# Patient Record
Sex: Male | Born: 1935
Health system: Southern US, Community
[De-identification: ages and names within clinical notes are randomized; demographics above are authoritative.]

## PROBLEM LIST (undated history)

## (undated) DIAGNOSIS — J449 Chronic obstructive pulmonary disease, unspecified: Secondary | ICD-10-CM

## (undated) DIAGNOSIS — G5790 Unspecified mononeuropathy of unspecified lower limb: Secondary | ICD-10-CM

## (undated) DIAGNOSIS — K5790 Diverticulosis of intestine, part unspecified, without perforation or abscess without bleeding: Secondary | ICD-10-CM

## (undated) DIAGNOSIS — K567 Ileus, unspecified: Secondary | ICD-10-CM

## (undated) DIAGNOSIS — E119 Type 2 diabetes mellitus without complications: Secondary | ICD-10-CM

## (undated) DIAGNOSIS — Z860100 Personal history of colon polyps, unspecified: Secondary | ICD-10-CM

## (undated) DIAGNOSIS — M2550 Pain in unspecified joint: Secondary | ICD-10-CM

## (undated) DIAGNOSIS — E785 Hyperlipidemia, unspecified: Secondary | ICD-10-CM

## (undated) DIAGNOSIS — N289 Disorder of kidney and ureter, unspecified: Secondary | ICD-10-CM

## (undated) DIAGNOSIS — H269 Unspecified cataract: Secondary | ICD-10-CM

## (undated) DIAGNOSIS — E538 Deficiency of other specified B group vitamins: Secondary | ICD-10-CM

## (undated) DIAGNOSIS — D649 Anemia, unspecified: Secondary | ICD-10-CM

## (undated) DIAGNOSIS — E559 Vitamin D deficiency, unspecified: Secondary | ICD-10-CM

## (undated) DIAGNOSIS — K219 Gastro-esophageal reflux disease without esophagitis: Secondary | ICD-10-CM

## (undated) DIAGNOSIS — K635 Polyp of colon: Secondary | ICD-10-CM

## (undated) DIAGNOSIS — M1990 Unspecified osteoarthritis, unspecified site: Secondary | ICD-10-CM

## (undated) DIAGNOSIS — I10 Essential (primary) hypertension: Secondary | ICD-10-CM

## (undated) DIAGNOSIS — K9189 Other postprocedural complications and disorders of digestive system: Secondary | ICD-10-CM

## (undated) DIAGNOSIS — G629 Polyneuropathy, unspecified: Secondary | ICD-10-CM

## (undated) HISTORY — DX: Vitamin D deficiency, unspecified: E55.9

## (undated) HISTORY — DX: Anemia, unspecified: D64.9

## (undated) HISTORY — PX: POLYPECTOMY: SHX149

## (undated) HISTORY — DX: Unspecified cataract: H26.9

## (undated) HISTORY — DX: Deficiency of other specified B group vitamins: E53.8

## (undated) HISTORY — PX: TURP VAPORIZATION: SUR1397

## (undated) HISTORY — PX: CATARACT EXTRACTION: SUR2

## (undated) HISTORY — DX: Unspecified osteoarthritis, unspecified site: M19.90

## (undated) HISTORY — DX: Gastro-esophageal reflux disease without esophagitis: K21.9

## (undated) HISTORY — DX: Hyperlipidemia, unspecified: E78.5

## (undated) HISTORY — DX: Unspecified mononeuropathy of unspecified lower limb: G57.90

## (undated) HISTORY — DX: Type 2 diabetes mellitus without complications: E11.9

## (undated) HISTORY — DX: Polyp of colon: K63.5

## (undated) HISTORY — PX: COLONOSCOPY: SHX174

## (undated) HISTORY — DX: Essential (primary) hypertension: I10

## (undated) HISTORY — DX: Diverticulosis of intestine, part unspecified, without perforation or abscess without bleeding: K57.90

---

## 1997-08-24 ENCOUNTER — Ambulatory Visit (HOSPITAL_COMMUNITY): Admission: RE | Admit: 1997-08-24 | Discharge: 1997-08-24 | Payer: Self-pay | Admitting: Urology

## 1998-04-30 ENCOUNTER — Inpatient Hospital Stay (HOSPITAL_COMMUNITY): Admission: EM | Admit: 1998-04-30 | Discharge: 1998-05-02 | Payer: Self-pay | Admitting: Emergency Medicine

## 1998-04-30 ENCOUNTER — Encounter: Payer: Self-pay | Admitting: Emergency Medicine

## 1999-03-10 HISTORY — PX: TOTAL KNEE ARTHROPLASTY: SHX125

## 1999-04-01 ENCOUNTER — Ambulatory Visit (HOSPITAL_BASED_OUTPATIENT_CLINIC_OR_DEPARTMENT_OTHER): Admission: RE | Admit: 1999-04-01 | Discharge: 1999-04-01 | Payer: Self-pay | Admitting: Urology

## 1999-06-09 LAB — HM COLONOSCOPY

## 1999-07-23 ENCOUNTER — Encounter: Payer: Self-pay | Admitting: Internal Medicine

## 1999-07-23 ENCOUNTER — Ambulatory Visit (HOSPITAL_COMMUNITY): Admission: RE | Admit: 1999-07-23 | Discharge: 1999-07-23 | Payer: Self-pay | Admitting: Internal Medicine

## 1999-09-01 ENCOUNTER — Encounter: Payer: Self-pay | Admitting: Specialist

## 1999-09-08 ENCOUNTER — Inpatient Hospital Stay (HOSPITAL_COMMUNITY): Admission: RE | Admit: 1999-09-08 | Discharge: 1999-09-14 | Payer: Self-pay | Admitting: Specialist

## 1999-09-09 ENCOUNTER — Encounter: Payer: Self-pay | Admitting: Specialist

## 1999-09-11 ENCOUNTER — Encounter: Payer: Self-pay | Admitting: Specialist

## 1999-09-11 ENCOUNTER — Encounter: Payer: Self-pay | Admitting: Gastroenterology

## 1999-09-12 ENCOUNTER — Encounter: Payer: Self-pay | Admitting: Gastroenterology

## 2000-12-23 ENCOUNTER — Encounter (INDEPENDENT_AMBULATORY_CARE_PROVIDER_SITE_OTHER): Payer: Self-pay | Admitting: *Deleted

## 2000-12-23 ENCOUNTER — Ambulatory Visit (HOSPITAL_COMMUNITY): Admission: RE | Admit: 2000-12-23 | Discharge: 2000-12-23 | Payer: Self-pay | Admitting: Gastroenterology

## 2002-01-10 ENCOUNTER — Encounter: Admission: RE | Admit: 2002-01-10 | Discharge: 2002-01-10 | Payer: Self-pay | Admitting: Internal Medicine

## 2002-01-10 ENCOUNTER — Encounter: Payer: Self-pay | Admitting: Internal Medicine

## 2002-01-13 ENCOUNTER — Encounter: Payer: Self-pay | Admitting: Internal Medicine

## 2002-01-13 ENCOUNTER — Encounter: Admission: RE | Admit: 2002-01-13 | Discharge: 2002-01-13 | Payer: Self-pay | Admitting: Internal Medicine

## 2004-05-20 ENCOUNTER — Ambulatory Visit: Payer: Self-pay | Admitting: Internal Medicine

## 2004-05-21 ENCOUNTER — Ambulatory Visit: Payer: Self-pay | Admitting: Internal Medicine

## 2004-05-21 ENCOUNTER — Emergency Department (HOSPITAL_COMMUNITY): Admission: EM | Admit: 2004-05-21 | Discharge: 2004-05-21 | Payer: Self-pay | Admitting: Family Medicine

## 2004-05-23 ENCOUNTER — Ambulatory Visit: Payer: Self-pay | Admitting: Internal Medicine

## 2004-06-09 ENCOUNTER — Ambulatory Visit: Payer: Self-pay | Admitting: Internal Medicine

## 2004-07-16 ENCOUNTER — Ambulatory Visit: Payer: Self-pay | Admitting: Internal Medicine

## 2004-10-17 ENCOUNTER — Ambulatory Visit: Payer: Self-pay | Admitting: Internal Medicine

## 2005-05-19 ENCOUNTER — Ambulatory Visit: Payer: Self-pay | Admitting: Internal Medicine

## 2005-05-26 ENCOUNTER — Ambulatory Visit: Payer: Self-pay | Admitting: Internal Medicine

## 2005-07-22 ENCOUNTER — Ambulatory Visit: Payer: Self-pay | Admitting: Internal Medicine

## 2006-03-01 ENCOUNTER — Ambulatory Visit: Payer: Self-pay | Admitting: Internal Medicine

## 2006-03-19 ENCOUNTER — Ambulatory Visit: Payer: Self-pay | Admitting: Internal Medicine

## 2006-07-15 ENCOUNTER — Ambulatory Visit: Payer: Self-pay | Admitting: Internal Medicine

## 2006-07-15 LAB — CONVERTED CEMR LAB
ALT: 25 units/L (ref 0–40)
AST: 28 units/L (ref 0–37)
Albumin: 4 g/dL (ref 3.5–5.2)
Alkaline Phosphatase: 98 units/L (ref 39–117)
BUN: 17 mg/dL (ref 6–23)
Basophils Absolute: 0 10*3/uL (ref 0.0–0.1)
Basophils Relative: 0.9 % (ref 0.0–1.0)
Bilirubin, Direct: 0.2 mg/dL (ref 0.0–0.3)
CO2: 31 meq/L (ref 19–32)
Calcium: 9.4 mg/dL (ref 8.4–10.5)
Chloride: 105 meq/L (ref 96–112)
Cholesterol: 129 mg/dL (ref 0–200)
Creatinine, Ser: 0.8 mg/dL (ref 0.4–1.5)
Eosinophils Absolute: 0.2 10*3/uL (ref 0.0–0.6)
Eosinophils Relative: 4.2 % (ref 0.0–5.0)
GFR calc Af Amer: 123 mL/min
GFR calc non Af Amer: 102 mL/min
Glucose, Bld: 131 mg/dL — ABNORMAL HIGH (ref 70–99)
HCT: 45.4 % (ref 39.0–52.0)
HDL: 46.2 mg/dL (ref >39.0)
Hemoglobin: 15.2 g/dL (ref 13.0–17.0)
LDL Cholesterol: 70 mg/dL (ref 0–99)
Lymphocytes Relative: 35 % (ref 12.0–46.0)
MCHC: 33.5 g/dL (ref 30.0–36.0)
MCV: 85.9 fL (ref 78.0–100.0)
Monocytes Absolute: 0.5 10*3/uL (ref 0.2–0.7)
Monocytes Relative: 12.7 % — ABNORMAL HIGH (ref 3.0–11.0)
Neutro Abs: 1.6 10*3/uL (ref 1.4–7.7)
Neutrophils Relative %: 47.2 % (ref 43.0–77.0)
PSA: 0.83 ng/mL (ref 0.10–4.00)
Platelets: 195 10*3/uL (ref 150–400)
Potassium: 4.2 meq/L (ref 3.5–5.1)
RBC: 5.28 M/uL (ref 4.22–5.81)
RDW: 13.3 % (ref 11.5–14.6)
Sodium: 143 meq/L (ref 135–145)
TSH: 2.72 microintl units/mL (ref 0.35–5.50)
Total Bilirubin: 0.8 mg/dL (ref 0.3–1.2)
Total CHOL/HDL Ratio: 2.8
Total Protein: 7 g/dL (ref 6.0–8.3)
Triglycerides: 64 mg/dL (ref 0–149)
VLDL: 13 mg/dL (ref 0–40)
WBC: 3.6 10*3/uL — ABNORMAL LOW (ref 4.5–10.5)

## 2006-10-05 DIAGNOSIS — I10 Essential (primary) hypertension: Secondary | ICD-10-CM | POA: Insufficient documentation

## 2006-10-05 DIAGNOSIS — K219 Gastro-esophageal reflux disease without esophagitis: Secondary | ICD-10-CM | POA: Insufficient documentation

## 2007-01-24 ENCOUNTER — Ambulatory Visit: Payer: Self-pay | Admitting: Internal Medicine

## 2007-01-24 DIAGNOSIS — J309 Allergic rhinitis, unspecified: Secondary | ICD-10-CM | POA: Insufficient documentation

## 2007-01-24 DIAGNOSIS — R05 Cough: Secondary | ICD-10-CM

## 2007-01-24 DIAGNOSIS — E119 Type 2 diabetes mellitus without complications: Secondary | ICD-10-CM | POA: Insufficient documentation

## 2007-01-24 DIAGNOSIS — E785 Hyperlipidemia, unspecified: Secondary | ICD-10-CM | POA: Insufficient documentation

## 2007-01-24 DIAGNOSIS — E1140 Type 2 diabetes mellitus with diabetic neuropathy, unspecified: Secondary | ICD-10-CM | POA: Insufficient documentation

## 2007-01-24 DIAGNOSIS — N400 Enlarged prostate without lower urinary tract symptoms: Secondary | ICD-10-CM | POA: Insufficient documentation

## 2007-01-24 DIAGNOSIS — R059 Cough, unspecified: Secondary | ICD-10-CM | POA: Insufficient documentation

## 2007-01-26 LAB — CONVERTED CEMR LAB
ALT: 28 units/L (ref 0–53)
AST: 34 units/L (ref 0–37)
Albumin: 4.2 g/dL (ref 3.5–5.2)
Alkaline Phosphatase: 101 units/L (ref 39–117)
BUN: 11 mg/dL (ref 6–23)
Bilirubin, Direct: 0.1 mg/dL (ref 0.0–0.3)
CO2: 31 meq/L (ref 19–32)
Calcium: 10 mg/dL (ref 8.4–10.5)
Chloride: 102 meq/L (ref 96–112)
Cholesterol: 136 mg/dL (ref 0–200)
Creatinine, Ser: 0.8 mg/dL (ref 0.4–1.5)
GFR calc Af Amer: 123 mL/min
GFR calc non Af Amer: 102 mL/min
Glucose, Bld: 135 mg/dL — ABNORMAL HIGH (ref 70–99)
HDL: 48.9 mg/dL (ref >39.0)
Hgb A1c MFr Bld: 7.6 % — ABNORMAL HIGH (ref 4.6–6.0)
LDL Cholesterol: 75 mg/dL (ref 0–99)
Potassium: 4.3 meq/L (ref 3.5–5.1)
Sodium: 141 meq/L (ref 135–145)
Total Bilirubin: 0.7 mg/dL (ref 0.3–1.2)
Total CHOL/HDL Ratio: 2.8
Total Protein: 7.5 g/dL (ref 6.0–8.3)
Triglycerides: 62 mg/dL (ref 0–149)
VLDL: 12 mg/dL (ref 0–40)

## 2007-04-27 ENCOUNTER — Ambulatory Visit: Payer: Self-pay | Admitting: Internal Medicine

## 2007-04-28 LAB — CONVERTED CEMR LAB
BUN: 10 mg/dL (ref 6–23)
CO2: 32 meq/L (ref 19–32)
Calcium: 9.5 mg/dL (ref 8.4–10.5)
Chloride: 105 meq/L (ref 96–112)
Cholesterol: 135 mg/dL (ref 0–200)
Creatinine, Ser: 0.9 mg/dL (ref 0.4–1.5)
GFR calc Af Amer: 107 mL/min
GFR calc non Af Amer: 89 mL/min
Glucose, Bld: 132 mg/dL — ABNORMAL HIGH (ref 70–99)
HDL: 45.3 mg/dL (ref >39.0)
Hgb A1c MFr Bld: 7.5 % — ABNORMAL HIGH (ref 4.6–6.0)
LDL Cholesterol: 74 mg/dL (ref 0–99)
Potassium: 4.5 meq/L (ref 3.5–5.1)
Sodium: 142 meq/L (ref 135–145)
Total CHOL/HDL Ratio: 3
Triglycerides: 77 mg/dL (ref 0–149)
VLDL: 15 mg/dL (ref 0–40)

## 2007-05-23 ENCOUNTER — Telehealth: Payer: Self-pay | Admitting: Internal Medicine

## 2007-07-27 ENCOUNTER — Ambulatory Visit: Payer: Self-pay | Admitting: Internal Medicine

## 2007-07-27 DIAGNOSIS — R5383 Other fatigue: Secondary | ICD-10-CM

## 2007-07-27 DIAGNOSIS — R5381 Other malaise: Secondary | ICD-10-CM | POA: Insufficient documentation

## 2007-07-27 LAB — CONVERTED CEMR LAB
ALT: 16 units/L (ref 0–53)
AST: 23 units/L (ref 0–37)
Albumin: 4.2 g/dL (ref 3.5–5.2)
Alkaline Phosphatase: 102 units/L (ref 39–117)
BUN: 12 mg/dL (ref 6–23)
Basophils Absolute: 0 10*3/uL (ref 0.0–0.1)
Basophils Relative: 1 % (ref 0.0–1.0)
Bilirubin, Direct: 0.1 mg/dL (ref 0.0–0.3)
CO2: 32 meq/L (ref 19–32)
Calcium: 9.6 mg/dL (ref 8.4–10.5)
Chloride: 108 meq/L (ref 96–112)
Cholesterol: 133 mg/dL (ref 0–200)
Creatinine, Ser: 0.8 mg/dL (ref 0.4–1.5)
Creatinine,U: 153.9 mg/dL
Eosinophils Absolute: 0.1 10*3/uL (ref 0.0–0.7)
Eosinophils Relative: 2.7 % (ref 0.0–5.0)
GFR calc Af Amer: 123 mL/min
GFR calc non Af Amer: 101 mL/min
Glucose, Bld: 135 mg/dL — ABNORMAL HIGH (ref 70–99)
HCT: 44.7 % (ref 39.0–52.0)
HDL: 51 mg/dL (ref >39.0)
Hemoglobin: 14.6 g/dL (ref 13.0–17.0)
Hgb A1c MFr Bld: 7.2 % — ABNORMAL HIGH (ref 4.6–6.0)
LDL Cholesterol: 66 mg/dL (ref 0–99)
Lymphocytes Relative: 31.7 % (ref 12.0–46.0)
MCHC: 32.7 g/dL (ref 30.0–36.0)
MCV: 86.8 fL (ref 78.0–100.0)
Microalb Creat Ratio: 16.9 mg/g (ref 0.0–30.0)
Microalb, Ur: 2.6 mg/dL — ABNORMAL HIGH (ref 0.0–1.9)
Monocytes Absolute: 0.3 10*3/uL (ref 0.1–1.0)
Monocytes Relative: 8.8 % (ref 3.0–12.0)
Neutro Abs: 2.1 10*3/uL (ref 1.4–7.7)
Neutrophils Relative %: 55.8 % (ref 43.0–77.0)
Platelets: 178 10*3/uL (ref 150–400)
Potassium: 4.6 meq/L (ref 3.5–5.1)
RBC: 5.15 M/uL (ref 4.22–5.81)
RDW: 13.5 % (ref 11.5–14.6)
Sodium: 146 meq/L — ABNORMAL HIGH (ref 135–145)
TSH: 2.41 microintl units/mL (ref 0.35–5.50)
Total Bilirubin: 0.6 mg/dL (ref 0.3–1.2)
Total CHOL/HDL Ratio: 2.6
Total Protein: 7.2 g/dL (ref 6.0–8.3)
Triglycerides: 81 mg/dL (ref 0–149)
VLDL: 16 mg/dL (ref 0–40)
WBC: 3.7 10*3/uL — ABNORMAL LOW (ref 4.5–10.5)

## 2007-08-12 ENCOUNTER — Encounter: Payer: Self-pay | Admitting: Internal Medicine

## 2007-08-25 ENCOUNTER — Telehealth: Payer: Self-pay | Admitting: Internal Medicine

## 2007-08-25 ENCOUNTER — Ambulatory Visit: Payer: Self-pay | Admitting: Internal Medicine

## 2007-10-04 ENCOUNTER — Telehealth (INDEPENDENT_AMBULATORY_CARE_PROVIDER_SITE_OTHER): Payer: Self-pay | Admitting: *Deleted

## 2007-10-06 ENCOUNTER — Ambulatory Visit: Payer: Self-pay | Admitting: Internal Medicine

## 2007-10-06 DIAGNOSIS — M79609 Pain in unspecified limb: Secondary | ICD-10-CM | POA: Insufficient documentation

## 2007-10-06 DIAGNOSIS — G5790 Unspecified mononeuropathy of unspecified lower limb: Secondary | ICD-10-CM | POA: Insufficient documentation

## 2007-10-06 HISTORY — DX: Unspecified mononeuropathy of unspecified lower limb: G57.90

## 2007-10-11 ENCOUNTER — Encounter: Payer: Self-pay | Admitting: Internal Medicine

## 2007-10-11 ENCOUNTER — Ambulatory Visit: Payer: Self-pay

## 2007-12-23 ENCOUNTER — Encounter: Payer: Self-pay | Admitting: Internal Medicine

## 2007-12-28 LAB — CONVERTED CEMR LAB: PSA: 1.19 ng/mL

## 2008-01-25 ENCOUNTER — Ambulatory Visit: Payer: Self-pay | Admitting: Internal Medicine

## 2008-01-25 LAB — CONVERTED CEMR LAB
BUN: 18 mg/dL (ref 6–23)
CO2: 32 meq/L (ref 19–32)
Calcium: 10 mg/dL (ref 8.4–10.5)
Chloride: 104 meq/L (ref 96–112)
Cholesterol: 127 mg/dL (ref 0–200)
Creatinine, Ser: 0.9 mg/dL (ref 0.4–1.5)
GFR calc Af Amer: 107 mL/min
GFR calc non Af Amer: 88 mL/min
Glucose, Bld: 112 mg/dL — ABNORMAL HIGH (ref 70–99)
HDL: 59.3 mg/dL (ref >39.0)
Hgb A1c MFr Bld: 6.8 % — ABNORMAL HIGH (ref 4.6–6.0)
LDL Cholesterol: 58 mg/dL (ref 0–99)
Potassium: 4.7 meq/L (ref 3.5–5.1)
Sodium: 141 meq/L (ref 135–145)
Total CHOL/HDL Ratio: 2.1
Triglycerides: 49 mg/dL (ref 0–149)
VLDL: 10 mg/dL (ref 0–40)

## 2008-06-11 ENCOUNTER — Ambulatory Visit: Payer: Self-pay | Admitting: Internal Medicine

## 2008-06-11 DIAGNOSIS — J209 Acute bronchitis, unspecified: Secondary | ICD-10-CM | POA: Insufficient documentation

## 2008-06-21 ENCOUNTER — Ambulatory Visit: Payer: Self-pay | Admitting: Internal Medicine

## 2008-06-21 DIAGNOSIS — G4700 Insomnia, unspecified: Secondary | ICD-10-CM | POA: Insufficient documentation

## 2008-07-25 ENCOUNTER — Ambulatory Visit: Payer: Self-pay | Admitting: Internal Medicine

## 2008-07-26 LAB — CONVERTED CEMR LAB
ALT: 16 units/L (ref 0–53)
AST: 21 units/L (ref 0–37)
Albumin: 4.3 g/dL (ref 3.5–5.2)
Alkaline Phosphatase: 94 units/L (ref 39–117)
BUN: 22 mg/dL (ref 6–23)
Basophils Absolute: 0 10*3/uL (ref 0.0–0.1)
Basophils Relative: 0.6 % (ref 0.0–3.0)
Bilirubin Urine: NEGATIVE
Bilirubin, Direct: 0.1 mg/dL (ref 0.0–0.3)
CO2: 30 meq/L (ref 19–32)
Calcium: 9.9 mg/dL (ref 8.4–10.5)
Chloride: 108 meq/L (ref 96–112)
Cholesterol: 130 mg/dL (ref 0–200)
Creatinine, Ser: 1 mg/dL (ref 0.4–1.5)
Creatinine,U: 119.7 mg/dL
Eosinophils Absolute: 0.1 10*3/uL (ref 0.0–0.7)
Eosinophils Relative: 3.2 % (ref 0.0–5.0)
Folate: 11.5 ng/mL
GFR calc non Af Amer: 94.46 mL/min (ref >60)
Glucose, Bld: 124 mg/dL — ABNORMAL HIGH (ref 70–99)
HCT: 42.1 % (ref 39.0–52.0)
HDL: 52.3 mg/dL (ref >39.00)
Hemoglobin, Urine: NEGATIVE
Hemoglobin: 14.1 g/dL (ref 13.0–17.0)
Hgb A1c MFr Bld: 7.1 % — ABNORMAL HIGH (ref 4.6–6.5)
Ketones, ur: NEGATIVE mg/dL
LDL Cholesterol: 68 mg/dL (ref 0–99)
Leukocytes, UA: NEGATIVE
Lymphocytes Relative: 31.2 % (ref 12.0–46.0)
Lymphs Abs: 1.2 10*3/uL (ref 0.7–4.0)
MCHC: 33.5 g/dL (ref 30.0–36.0)
MCV: 85.3 fL (ref 78.0–100.0)
Microalb Creat Ratio: 5.8 mg/g (ref 0.0–30.0)
Microalb, Ur: 0.7 mg/dL (ref 0.0–1.9)
Monocytes Absolute: 0.4 10*3/uL (ref 0.1–1.0)
Monocytes Relative: 9.8 % (ref 3.0–12.0)
Neutro Abs: 2.1 10*3/uL (ref 1.4–7.7)
Neutrophils Relative %: 55.2 % (ref 43.0–77.0)
Nitrite: NEGATIVE
PSA: 1.46 ng/mL (ref 0.10–4.00)
Platelets: 164 10*3/uL (ref 150.0–400.0)
Potassium: 4.7 meq/L (ref 3.5–5.1)
RBC: 4.94 M/uL (ref 4.22–5.81)
RDW: 13.7 % (ref 11.5–14.6)
Sed Rate: 14 mm/hr (ref 0–22)
Sodium: 143 meq/L (ref 135–145)
Specific Gravity, Urine: 1.02 (ref 1.000–1.030)
TSH: 1.12 microintl units/mL (ref 0.35–5.50)
Total Bilirubin: 0.4 mg/dL (ref 0.3–1.2)
Total CHOL/HDL Ratio: 2
Total Protein, Urine: NEGATIVE mg/dL
Total Protein: 7.3 g/dL (ref 6.0–8.3)
Triglycerides: 48 mg/dL (ref 0.0–149.0)
Urine Glucose: NEGATIVE mg/dL
Urobilinogen, UA: 0.2 (ref 0.0–1.0)
VLDL: 9.6 mg/dL (ref 0.0–40.0)
Vitamin B-12: 184 pg/mL — ABNORMAL LOW (ref 211–911)
WBC: 3.8 10*3/uL — ABNORMAL LOW (ref 4.5–10.5)
pH: 6 (ref 5.0–8.0)

## 2008-08-15 ENCOUNTER — Encounter: Payer: Self-pay | Admitting: Internal Medicine

## 2008-09-06 ENCOUNTER — Ambulatory Visit: Payer: Self-pay | Admitting: Internal Medicine

## 2008-09-06 DIAGNOSIS — E538 Deficiency of other specified B group vitamins: Secondary | ICD-10-CM

## 2008-09-06 HISTORY — DX: Deficiency of other specified B group vitamins: E53.8

## 2008-09-06 LAB — CONVERTED CEMR LAB
BUN: 14 mg/dL (ref 6–23)
CO2: 32 meq/L (ref 19–32)
Calcium: 9.9 mg/dL (ref 8.4–10.5)
Chloride: 100 meq/L (ref 96–112)
Creatinine, Ser: 0.9 mg/dL (ref 0.4–1.5)
GFR calc non Af Amer: 106.63 mL/min (ref >60)
Glucose, Bld: 185 mg/dL — ABNORMAL HIGH (ref 70–99)
Hgb A1c MFr Bld: 6.8 % — ABNORMAL HIGH (ref 4.6–6.5)
Potassium: 4.4 meq/L (ref 3.5–5.1)
Sodium: 140 meq/L (ref 135–145)

## 2008-12-31 ENCOUNTER — Encounter: Payer: Self-pay | Admitting: Internal Medicine

## 2009-03-22 ENCOUNTER — Ambulatory Visit: Payer: Self-pay | Admitting: Internal Medicine

## 2009-03-23 ENCOUNTER — Encounter: Payer: Self-pay | Admitting: Internal Medicine

## 2009-03-23 LAB — CONVERTED CEMR LAB
BUN: 13 mg/dL (ref 6–23)
CO2: 21 meq/L (ref 19–32)
Calcium: 9.9 mg/dL (ref 8.4–10.5)
Chloride: 103 meq/L (ref 96–112)
Cholesterol: 147 mg/dL (ref 0–200)
Creatinine, Ser: 0.97 mg/dL (ref 0.40–1.50)
Glucose, Bld: 92 mg/dL (ref 70–99)
HDL: 54 mg/dL (ref >39)
LDL Cholesterol: 72 mg/dL (ref 0–99)
Potassium: 4.5 meq/L (ref 3.5–5.3)
Sodium: 143 meq/L (ref 135–145)
Total CHOL/HDL Ratio: 2.7
Triglycerides: 106 mg/dL (ref <150)
VLDL: 21 mg/dL (ref 0–40)

## 2009-03-25 LAB — CONVERTED CEMR LAB: Hgb A1c MFr Bld: 7 % — ABNORMAL HIGH (ref 4.6–6.5)

## 2009-03-26 ENCOUNTER — Telehealth: Payer: Self-pay | Admitting: Internal Medicine

## 2009-05-23 ENCOUNTER — Ambulatory Visit: Payer: Self-pay | Admitting: Internal Medicine

## 2009-08-27 ENCOUNTER — Encounter: Payer: Self-pay | Admitting: Internal Medicine

## 2009-09-17 ENCOUNTER — Encounter (INDEPENDENT_AMBULATORY_CARE_PROVIDER_SITE_OTHER): Payer: Self-pay | Admitting: *Deleted

## 2009-09-17 ENCOUNTER — Ambulatory Visit: Payer: Self-pay | Admitting: Internal Medicine

## 2009-09-17 DIAGNOSIS — Z87891 Personal history of nicotine dependence: Secondary | ICD-10-CM | POA: Insufficient documentation

## 2009-09-17 LAB — CONVERTED CEMR LAB
ALT: 12 units/L (ref 0–53)
AST: 20 units/L (ref 0–37)
Albumin: 4.2 g/dL (ref 3.5–5.2)
Alkaline Phosphatase: 86 units/L (ref 39–117)
BUN: 16 mg/dL (ref 6–23)
Basophils Absolute: 0 10*3/uL (ref 0.0–0.1)
Basophils Relative: 0.8 % (ref 0.0–3.0)
Bilirubin Urine: NEGATIVE
Bilirubin, Direct: 0.1 mg/dL (ref 0.0–0.3)
CO2: 31 meq/L (ref 19–32)
Calcium: 9.6 mg/dL (ref 8.4–10.5)
Chloride: 105 meq/L (ref 96–112)
Cholesterol: 129 mg/dL (ref 0–200)
Creatinine, Ser: 0.9 mg/dL (ref 0.4–1.5)
Creatinine,U: 195.6 mg/dL
Eosinophils Absolute: 0.1 10*3/uL (ref 0.0–0.7)
Eosinophils Relative: 3.8 % (ref 0.0–5.0)
Folate: 12.5 ng/mL
GFR calc non Af Amer: 101.12 mL/min (ref >60)
Glucose, Bld: 120 mg/dL — ABNORMAL HIGH (ref 70–99)
HCT: 43 % (ref 39.0–52.0)
HDL: 50.6 mg/dL (ref >39.00)
Hemoglobin, Urine: NEGATIVE
Hemoglobin: 14.4 g/dL (ref 13.0–17.0)
Hgb A1c MFr Bld: 7.3 % — ABNORMAL HIGH (ref 4.6–6.5)
Iron: 58 ug/dL (ref 42–165)
Ketones, ur: NEGATIVE mg/dL
LDL Cholesterol: 66 mg/dL (ref 0–99)
Leukocytes, UA: NEGATIVE
Lymphocytes Relative: 32.2 % (ref 12.0–46.0)
Lymphs Abs: 1.2 10*3/uL (ref 0.7–4.0)
MCHC: 33.6 g/dL (ref 30.0–36.0)
MCV: 87.2 fL (ref 78.0–100.0)
Microalb Creat Ratio: 0.5 mg/g (ref 0.0–30.0)
Microalb, Ur: 0.9 mg/dL (ref 0.0–1.9)
Monocytes Absolute: 0.4 10*3/uL (ref 0.1–1.0)
Monocytes Relative: 10.2 % (ref 3.0–12.0)
Neutro Abs: 2 10*3/uL (ref 1.4–7.7)
Neutrophils Relative %: 53 % (ref 43.0–77.0)
Nitrite: NEGATIVE
Platelets: 184 10*3/uL (ref 150.0–400.0)
Potassium: 4.5 meq/L (ref 3.5–5.1)
RBC: 4.93 M/uL (ref 4.22–5.81)
RDW: 14.9 % — ABNORMAL HIGH (ref 11.5–14.6)
Saturation Ratios: 15.3 % — ABNORMAL LOW (ref 20.0–50.0)
Sed Rate: 13 mm/hr (ref 0–22)
Sodium: 143 meq/L (ref 135–145)
Specific Gravity, Urine: >=1.03 (ref 1.000–1.030)
TSH: 2.2 microintl units/mL (ref 0.35–5.50)
Total Bilirubin: 0.6 mg/dL (ref 0.3–1.2)
Total CHOL/HDL Ratio: 3
Total Protein, Urine: NEGATIVE mg/dL
Total Protein: 7 g/dL (ref 6.0–8.3)
Transferrin: 270.7 mg/dL (ref 212.0–360.0)
Triglycerides: 64 mg/dL (ref 0.0–149.0)
Urine Glucose: NEGATIVE mg/dL
Urobilinogen, UA: 0.2 (ref 0.0–1.0)
VLDL: 12.8 mg/dL (ref 0.0–40.0)
Vitamin B-12: 386 pg/mL (ref 211–911)
WBC: 3.7 10*3/uL — ABNORMAL LOW (ref 4.5–10.5)
pH: 6 (ref 5.0–8.0)

## 2009-09-18 ENCOUNTER — Telehealth (INDEPENDENT_AMBULATORY_CARE_PROVIDER_SITE_OTHER): Payer: Self-pay | Admitting: *Deleted

## 2009-09-18 LAB — CONVERTED CEMR LAB: Vit D, 25-Hydroxy: 24 ng/mL — ABNORMAL LOW (ref 30–89)

## 2009-09-19 ENCOUNTER — Encounter (INDEPENDENT_AMBULATORY_CARE_PROVIDER_SITE_OTHER): Payer: Self-pay | Admitting: *Deleted

## 2009-10-09 ENCOUNTER — Encounter: Payer: Self-pay | Admitting: Internal Medicine

## 2009-10-10 ENCOUNTER — Encounter: Payer: Self-pay | Admitting: Internal Medicine

## 2009-10-10 ENCOUNTER — Ambulatory Visit: Payer: Self-pay

## 2009-10-30 ENCOUNTER — Encounter (INDEPENDENT_AMBULATORY_CARE_PROVIDER_SITE_OTHER): Payer: Self-pay | Admitting: *Deleted

## 2009-11-04 ENCOUNTER — Encounter (INDEPENDENT_AMBULATORY_CARE_PROVIDER_SITE_OTHER): Payer: Self-pay | Admitting: *Deleted

## 2009-11-04 ENCOUNTER — Ambulatory Visit: Payer: Self-pay | Admitting: Gastroenterology

## 2009-11-18 ENCOUNTER — Ambulatory Visit: Payer: Self-pay | Admitting: Gastroenterology

## 2009-11-20 ENCOUNTER — Encounter: Payer: Self-pay | Admitting: Gastroenterology

## 2009-12-30 ENCOUNTER — Encounter: Payer: Self-pay | Admitting: Internal Medicine

## 2010-01-09 ENCOUNTER — Telehealth: Payer: Self-pay | Admitting: Internal Medicine

## 2010-01-16 ENCOUNTER — Ambulatory Visit: Payer: Self-pay | Admitting: Internal Medicine

## 2010-01-16 DIAGNOSIS — R062 Wheezing: Secondary | ICD-10-CM | POA: Insufficient documentation

## 2010-01-16 DIAGNOSIS — E559 Vitamin D deficiency, unspecified: Secondary | ICD-10-CM | POA: Insufficient documentation

## 2010-01-16 HISTORY — DX: Vitamin D deficiency, unspecified: E55.9

## 2010-03-20 ENCOUNTER — Ambulatory Visit
Admission: RE | Admit: 2010-03-20 | Discharge: 2010-03-20 | Payer: Self-pay | Source: Home / Self Care | Attending: Internal Medicine | Admitting: Internal Medicine

## 2010-03-20 ENCOUNTER — Other Ambulatory Visit: Payer: Self-pay | Admitting: Internal Medicine

## 2010-03-20 LAB — BASIC METABOLIC PANEL
BUN: 15 mg/dL (ref 6–23)
CO2: 31 mEq/L (ref 19–32)
Calcium: 9.7 mg/dL (ref 8.4–10.5)
Chloride: 100 mEq/L (ref 96–112)
Creatinine, Ser: 0.9 mg/dL (ref 0.4–1.5)
GFR: 106.18 mL/min (ref >60.00)
Glucose, Bld: 119 mg/dL — ABNORMAL HIGH (ref 70–99)
Potassium: 4.5 mEq/L (ref 3.5–5.1)
Sodium: 139 mEq/L (ref 135–145)

## 2010-03-20 LAB — LIPID PANEL
Cholesterol: 185 mg/dL (ref 0–200)
HDL: 56 mg/dL (ref >39.00)
LDL Cholesterol: 105 mg/dL — ABNORMAL HIGH (ref 0–99)
Total CHOL/HDL Ratio: 3
Triglycerides: 121 mg/dL (ref 0.0–149.0)
VLDL: 24.2 mg/dL (ref 0.0–40.0)

## 2010-03-20 LAB — HEMOGLOBIN A1C: Hgb A1c MFr Bld: 7.4 % — ABNORMAL HIGH (ref 4.6–6.5)

## 2010-04-10 NOTE — Letter (Signed)
Summary: Diabetic eye exam/[facility]  Diabetic eye exam/[facility]   Imported By: Bubba Hales 08/20/2008 09:08:24  _____________________________________________________________________  External Attachment:    Type:   Image     Comment:   External Document

## 2010-04-10 NOTE — Assessment & Plan Note (Signed)
Summary: COLD /$50 [facility]   Vital Signs:  Patient Profile:   76 Years Old Male Weight:      197.50 pounds Temp:     98.0 degrees F oral Pulse rate:   74 / minute Pulse rhythm:   regular BP sitting:   153 / 77  (right arm) Cuff size:   regular  Pt. in pain?   no  Vitals Entered By: Estell Harpin CMA (August 25, 2007 1:39 PM)                  Chief Complaint:  cold and cough.  History of Present Illness: here with 3 days acute onset prod cough and ST, fever, with some SOB but has had some mild wheezing, as well as HA; denies polys or low sugars    Updated Prior Medication List: OMEPRAZOLE 20 MG  CPDR (OMEPRAZOLE) 2po qd SIMVASTATIN 80 MG  TABS (SIMVASTATIN) 1po qd LISINOPRIL-HYDROCHLOROTHIAZIDE 20-12.5 MG  TABS (LISINOPRIL-HYDROCHLOROTHIAZIDE) 1 by mouth qd ECOTRIN LOW STRENGTH 81 MG  TBEC (ASPIRIN) 1 po qd GLIMEPIRIDE 1 MG  TABS (GLIMEPIRIDE) 1po once daily (as needed) METFORMIN HCL 500 MG  TABS (METFORMIN HCL) 2 tabs by mouth bid ROLAIDS 334 MG  CHEW (DIHYDROXYALUMINUM SOD CARB) as directed as needed ADULT ASPIRIN EC LOW STRENGTH 81 MG  TBEC (ASPIRIN) 1po qd AZITHROMYCIN 250 MG  TABS (AZITHROMYCIN) 2po qd for 1 day, then 1po qd for 4days, then stop TUSSIONEX PENNKINETIC ER 8-10 MG/5ML  LQCR (CHLORPHENIRAMINE-HYDROCODONE) 1 tsp by mouth two times a day as needed  Current Allergies (reviewed today): No known allergies   Past Medical History:    Reviewed history from 01/24/2007 and no changes required:       rhumatic fever as child       PUD       DJD       GERD       Hypertension       Hyperlipidemia       Diabetes mellitus, type II       Allergic rhinitis       Benign prostatic hypertrophy  Past Surgical History:    Reviewed history from 01/24/2007 and no changes required:       Total knee replacement - left knee 2001       s/p Laser TURP       s/p left knee arthroscopy   Family History:    Reviewed history from 01/24/2007 and no changes required:  brother with lung cancer  Social History:    Reviewed history from 01/24/2007 and no changes required:       Former Smoker       Alcohol use-no       Married       5 children       retired 2004 - yard [place]       all otherwise negative    Physical Exam  General:     mild ill, well developed  Head:     Normocephalic and atraumatic without obvious abnormalities. No apparent alopecia or balding. Eyes:     No corneal or conjunctival inflammation noted. EOMI. Perrla Ears:     bilat tm's red, sinus nontender Nose:     nasal dischargemucosal pallor and mucosal erythema.   Mouth:     pharyngeal erythema and fair dentition.   Neck:     supple and cervical lymphadenopathy.   Lungs:     trace wheezing bilat, on  rales Heart:     Normal rate and regular rhythm. S1 and S2 normal without gallop, murmur, click, rub or other extra sounds. Extremities:     no edema, no ulcers     Impression & Recommendations:  Problem # 1:  BRONCHITIS-ACUTE (ICD-466.0) with small wheezing - declines cxr, tx with antibx, cough med, gave sample proair hfa His updated medication list for this problem includes:    Azithromycin 250 Mg Tabs (Azithromycin) .Marland Kitchen... 2po qd for 1 day, then 1po qd for 4days, then stop    Tussionex Pennkinetic Er 8-10 Mg/65m Lqcr (Chlorpheniramine-hydrocodone) ..Marland Kitchen.. 1 tsp by mouth two times a day as needed   Problem # 2:  HYPERTENSION (ICD-401.9)  His updated medication list for this problem includes:    Lisinopril-hydrochlorothiazide 20-12.5 Mg Tabs (Lisinopril-hydrochlorothiazide) ..Marland Kitchen.. 1 by mouth qd treat as above, f/u any worsening signs or symptoms  - mild elev today likely situational  Problem # 3:  DIABETES MELLITUS, TYPE II (ICD-250.00)  His updated medication list for this problem includes:    Lisinopril-hydrochlorothiazide 20-12.5 Mg Tabs (Lisinopril-hydrochlorothiazide) ..Marland Kitchen.. 1 by mouth qd    Ecotrin Low Strength 81 Mg Tbec  (Aspirin) ..Marland Kitchen.. 1 po qd    Glimepiride 1 Mg Tabs (Glimepiride) ..Marland Kitchen.. 1po once daily (as needed)    Metformin Hcl 500 Mg Tabs (Metformin hcl) ..Marland Kitchen.. 2 tabs by mouth bid    Adult Aspirin Ec Low Strength 81 Mg Tbec (Aspirin) ..Marland Kitchen.. 1po qd  Labs Reviewed: HgBA1c: 7.2 (07/27/2007)   Creat: 0.8 (07/27/2007)    overall stable overall by hx and exam, ok to continue meds/tx as is   Complete Medication List: 1)  Omeprazole 20 Mg Cpdr (Omeprazole) .... 2po qd 2)  Simvastatin 80 Mg Tabs (Simvastatin) ..Marland Kitchen. 1po qd 3)  Lisinopril-hydrochlorothiazide 20-12.5 Mg Tabs (Lisinopril-hydrochlorothiazide) ..Marland Kitchen. 1 by mouth qd 4)  Ecotrin Low Strength 81 Mg Tbec (Aspirin) ..Marland Kitchen. 1 po qd 5)  Glimepiride 1 Mg Tabs (Glimepiride) ..Marland Kitchen. 1po once daily (as needed) 6)  Metformin Hcl 500 Mg Tabs (Metformin hcl) .... 2 tabs by mouth bid 7)  Rolaids 334 Mg Chew (Dihydroxyaluminum sod carb) .... As directed as needed 8)  Adult Aspirin Ec Low Strength 81 Mg Tbec (Aspirin) ..Marland Kitchen. 1po qd 9)  Azithromycin 250 Mg Tabs (Azithromycin) .... 2po qd for 1 day, then 1po qd for 4days, then stop 10)  Tussionex Pennkinetic Er 8-10 Mg/568mLqcr (Chlorpheniramine-hydrocodone) ...Marland Kitchen 1 tsp by mouth two times a day as needed   Patient Instructions: 1)  take all new medications as prescribed  2)  continue all medications that you may have been taking previously 3)  you can also use the Proair HFA as needed up to 4 times per day (2 puffs) 4)  followup as recommended at your last visit   Prescriptions: TUSSIONEX PENNKINETIC ER 8-10 MG/5ML  LQCR (CHLORPHENIRAMINE-HYDROCODONE) 1 tsp by mouth two times a day as needed  #6 oz x 1   Entered and Authorized by:   JaBiagio BorgD   Signed by:   JaBiagio BorgD on 08/25/2007   Method used:   Print then Give to Patient   RxID:   [id number] 250 MG  TABS (AZITHROMYCIN) 2po qd for 1 day, then 1po qd for 4days, then stop  #6 x 1   Entered and Authorized by:   JaBiagio BorgD   Signed by:    JaBiagio BorgD on 08/25/2007   Method used:   Print then Give to  Patient   RxID:   [id number]  ]

## 2010-04-10 NOTE — Assessment & Plan Note (Signed)
Summary: 6 MO ROV /NWS  #   Vital Signs:  Patient profile:   75 year old male Height:      75 inches Weight:      190.50 pounds BMI:     23.90 O2 Sat:      96 % on Room air Temp:     97.6 degrees F oral Pulse rate:   67 / minute BP sitting:   138 / 68  (left arm) Cuff size:   regular  Vitals Entered By: Shirlean Mylar Ewing CMA (AAMA) (March 20, 2010 9:04 AM)  O2 Flow:  Room air CC: 6 month ROV/RE   CC:  6 month ROV/RE.  History of Present Illness: here to f/u  - bronchitis resolved. ; and Pt denies CP, worsening sob, doe, wheezing, orthopnea, pnd, worsening LE edema, palps, dizziness or syncope  Pt denies new neuro symptoms such as headache, facial or extremity weakness  Pt denies polydipsia, polyuria, or low sugar symptoms such as shakiness improved with eating.  Overall good compliance with meds, trying to follow low chol, DM diet, wt stable, little excercise however  CBG's in the low 100's.  Overall good compliance with meds, and good tolerability.  No fever, wt loss, night sweats, loss of appetite or other constitutional symptoms  Denies worsening depressive symptoms, suicidal ideation, or panic.   Does have ongoing reflux symtpoms wihtout dysphagia, n/v, abd pain, bowel change or blood  Problems Prior to Update: 1)  Vitamin D Deficiency  (ICD-268.9) 2)  Wheezing  (ICD-786.07) 3)  Pers Hx Tobacco Use Presenting Hazards Health  (ICD-V15.82) 4)  Preventive Health Care  (ICD-V70.0) 5)  Vitamin B12 Deficiency  (ICD-266.2) 6)  Special Screening Malig Neoplasms Other Sites  (ICD-V76.49) 7)  Fatigue  (ICD-780.79) 8)  Insomnia  (ICD-780.52) 9)  Bronchitis, Acute  (ICD-466.0) 10)  Peripheral Neuropathy, Feet  (ICD-355.8) 11)  Leg Pain, Bilateral  (ICD-729.5) 12)  Fatigue  (ICD-780.79) 13)  Benign Prostatic Hypertrophy  (ICD-600.00) 14)  Allergic Rhinitis  (ICD-477.9) 15)  Cough  (ICD-786.2) 16)  Diabetes Mellitus, Type II  (ICD-250.00) 17)  Hyperlipidemia  (ICD-272.4) 18)   Hypertension  (ICD-401.9) 19)  Gerd  (ICD-530.81)  Medications Prior to Update: 1)  Omeprazole 20 Mg  Cpdr (Omeprazole) .... 2po Once Daily 2)  Simvastatin 80 Mg  Tabs (Simvastatin) .Marland Kitchen.. 1po Once Daily 3)  Hyzaar 100-25 Mg Tabs (Losartan Potassium-Hctz) .Marland Kitchen.. 1 By Mouth Once Daily (Generic) 4)  Ecotrin Low Strength 81 Mg  Tbec (Aspirin) .Marland Kitchen.. 1 Po Qd 5)  Metformin Hcl 500 Mg  Tabs (Metformin Hcl) .... 2 Tabs By Mouth Two Times A Day 6)  Proair Hfa 108 (90 Base) Mcg/act Aers (Albuterol Sulfate) .... 2 Puffs Four Times Per Day As Needed 7)  Cyanocobalamin 1000 Mcg/ml Soln (Cyanocobalamin) .Marland Kitchen.. 1 Cc Im Q Month 8)  Azithromycin 250 Mg Tabs (Azithromycin) .... 2po Qd For 1 Day, Then 1po Qd For 4days, Then Stop 9)  Hydrocodone-Homatropine 5-1.5 Mg/80m Syrp (Hydrocodone-Homatropine) ..Marland Kitchen. 1 Tsp By Mouth Q 6 Hrs As Needed 10)  Cvs Vitamin D3 1000 Unit Caps (Cholecalciferol) ..Marland Kitchen. 1po Once Daily  Current Medications (verified): 1)  Omeprazole 20 Mg  Cpdr (Omeprazole) .... 2po Once Daily 2)  Simvastatin 40 Mg Tabs (Simvastatin) ..Marland Kitchen. 1po Once Daily 3)  Hyzaar 100-25 Mg Tabs (Losartan Potassium-Hctz) ..Marland Kitchen. 1 By Mouth Once Daily (Generic) 4)  Ecotrin Low Strength 81 Mg  Tbec (Aspirin) ..Marland Kitchen. 1 Po Qd 5)  Metformin Hcl 500 Mg  Tabs (Metformin Hcl) ..Marland KitchenMarland KitchenMarland Kitchen  2 Tabs By Mouth Two Times A Day 6)  Proair Hfa 108 (90 Base) Mcg/act Aers (Albuterol Sulfate) .... 2 Puffs Four Times Per Day As Needed 7)  Cyanocobalamin 1000 Mcg/ml Soln (Cyanocobalamin) .Marland Kitchen.. 1 Cc Im Q Month 8)  Cvs Vitamin D3 1000 Unit Caps (Cholecalciferol) .Marland Kitchen.. 1po Once Daily  Allergies (verified): No Known Drug Allergies  Past History:  Past Medical History: Last updated: 01/16/2010 rhumatic fever as child PUD DJD GERD Hypertension Hyperlipidemia Diabetes mellitus, type II Allergic rhinitis Benign prostatic hypertrophy vit d deficiency  Past Surgical History: Last updated: 01/24/2007 Total knee replacement - left knee 2001 s/p Laser  TURP s/p left knee arthroscopy  Social History: Last updated: 09/17/2009 Former Smoker Alcohol use-no Married 5 children retired 2004 - yard [place] use-no  Risk Factors: Smoking Status: quit (01/24/2007)  Review of Systems       all otherwise negative per pt -    Physical Exam  General:  alert and well-developed.  Head:  normocephalic and atraumatic.   Eyes:  vision grossly intact, pupils equal, and pupils round.   Ears:  R ear normal and L ear normal.   Nose:  no external deformity and no nasal discharge.   Mouth:  no gingival abnormalities and pharynx pink and moist.   Neck:  supple and no masses.   Lungs:  normal respiratory effort and normal breath sounds.   Heart:  normal rate and regular rhythm.   Abdomen:  soft, non-tender, and normal bowel sounds.   Msk:  no joint tenderness and no joint swelling.   Extremities:  no edema, no erythema  Skin:  color normal and no rashes.   Psych:  not anxious appearing and not depressed appearing.     Impression & Recommendations:  Problem # 1:  DIABETES MELLITUS, TYPE II (ICD-250.00)  His updated medication list for this problem includes:    Hyzaar 100-25 Mg Tabs (Losartan potassium-hctz) .Marland Kitchen... 1 by mouth once daily (generic)    Ecotrin Low Strength 81 Mg Tbec (Aspirin) .Marland Kitchen... 1 po qd    Metformin Hcl 500 Mg Tabs (Metformin hcl) .Marland Kitchen... 2 tabs by mouth two times a day  Labs Reviewed: Creat: 0.9 (09/17/2009)    Reviewed HgBA1c results: 7.3 (09/17/2009)  7.0 (03/22/2009) stable overall by hx and exam, ok to continue meds/tx as is . Pt to cont DM diet, excercise, wt control efforts; to check labs today   Orders: TLB-BMP (Basic Metabolic Panel-BMET) (62952-WUXLKGM) TLB-A1C / Hgb A1C (Glycohemoglobin) (83036-A1C) TLB-Lipid Panel (80061-LIPID)  Problem # 2:  HYPERLIPIDEMIA (ICD-272.4)  His updated medication list for this problem includes:    Simvastatin 40 Mg Tabs (Simvastatin) .Marland Kitchen... 1po once daily to decr  the simvastatin to 40 mg due to FDA recent warning regarding the higher strength  Labs Reviewed: SGOT: 20 (09/17/2009)   SGPT: 12 (09/17/2009)   HDL:50.60 (09/17/2009), 54 (03/23/2009)  LDL:66 (09/17/2009), 72 (03/23/2009)  Chol:129 (09/17/2009), 147 (03/23/2009)  Trig:64.0 (09/17/2009), 106 (03/23/2009)  Problem # 3:  HYPERTENSION (ICD-401.9)  His updated medication list for this problem includes:    Hyzaar 100-25 Mg Tabs (Losartan potassium-hctz) .Marland Kitchen... 1 by mouth once daily (generic)  BP today: 138/68 Prior BP: 140/78 (01/16/2010)  Labs Reviewed: K+: 4.5 (09/17/2009) Creat: : 0.9 (09/17/2009)   Chol: 129 (09/17/2009)   HDL: 50.60 (09/17/2009)   LDL: 66 (09/17/2009)   TG: 64.0 (09/17/2009) stable overall by hx and exam, ok to continue meds/tx as is   Problem # 4:  GERD (ICD-530.81)  His updated medication list for this problem includes:    Omeprazole 20 Mg Cpdr (Omeprazole) .Marland Kitchen... 2po once daily  Labs Reviewed: Hgb: 14.4 (09/17/2009)   Hct: 43.0 (09/17/2009) stable overall by hx and exam, ok to continue meds/tx as is   Complete Medication List: 1)  Omeprazole 20 Mg Cpdr (Omeprazole) .... 2po once daily 2)  Simvastatin 40 Mg Tabs (Simvastatin) .Marland Kitchen.. 1po once daily 3)  Hyzaar 100-25 Mg Tabs (Losartan potassium-hctz) .Marland Kitchen.. 1 by mouth once daily (generic) 4)  Ecotrin Low Strength 81 Mg Tbec (Aspirin) .Marland Kitchen.. 1 po qd 5)  Metformin Hcl 500 Mg Tabs (Metformin hcl) .... 2 tabs by mouth two times a day 6)  Proair Hfa 108 (90 Base) Mcg/act Aers (Albuterol sulfate) .... 2 puffs four times per day as needed 7)  Cyanocobalamin 1000 Mcg/ml Soln (Cyanocobalamin) .Marland Kitchen.. 1 cc im q month 8)  Cvs Vitamin D3 1000 Unit Caps (Cholecalciferol) .Marland Kitchen.. 1po once daily  Patient Instructions: 1)  Continue all previous medications as before this visit 2)  Please go to the Lab in the basement for your blood and/or urine tests today 3)  Please call the number on the [place] for results of your testing  4)   Please schedule a follow-up appointment in 6 months. Prescriptions: CYANOCOBALAMIN 1000 MCG/ML SOLN (CYANOCOBALAMIN) 1 cc IM q month  #1bottle x 11   Entered and Authorized by:   [name] MD   Signed by:   [name] MD on 03/20/2010   Method used:   Print then Give to Patient   RxID:   [id number] PROAIR HFA 108 (90 BASE) MCG/ACT AERS (ALBUTEROL SULFATE) 2 puffs four times per day as needed  #3 x 3   Entered and Authorized by:   [name] MD   Signed by:   [name] MD on 03/20/2010   Method used:   Print then Give to Patient   RxID:   [id number] METFORMIN HCL 500 MG  TABS (METFORMIN HCL) 2 tabs by mouth two times a day  #360 x 3   Entered and Authorized by:   [name] MD   Signed by:   [name] MD on 03/20/2010   Method used:   Print then Give to Patient   RxID:   [id number] HYZAAR 100-25 MG TABS (LOSARTAN POTASSIUM-HCTZ) 1 by mouth once daily (GENERIC)  #90 x 3   Entered and Authorized by:   [name] MD   Signed by:   [name] MD on 03/20/2010   Method used:   Print then Give to Patient   RxID:   [id number] SIMVASTATIN 40 MG TABS (SIMVASTATIN) 1po once daily  #90 x 3   Entered and Authorized by:   [name] MD   Signed by:   [name] MD on 03/20/2010   Method used:   Print then Give to Patient   RxID:   [id number] OMEPRAZOLE 20 MG  CPDR (OMEPRAZOLE) 2po once daily  #180 x 3   Entered and Authorized by:   [name] MD   Signed by:   [name] MD on 03/20/2010   Method used:   Print then Give to Patient   RxID:   [id number]    Orders Added: 1)  TLB-BMP (Basic Metabolic Panel-BMET) [51102-TRZNBVA] 2)  TLB-A1C / Hgb A1C (Glycohemoglobin) [83036-A1C] 3)  TLB-Lipid Panel [80061-LIPID] 4)  Est. Patient Level IV [70141]

## 2010-04-10 NOTE — Assessment & Plan Note (Signed)
Summary: 3 MO ROV/ $50 /NWS   Vital Signs:  Patient Profile:   76 Years Old Male Weight:      198 pounds Temp:     97.0 degrees F oral Pulse rate:   54 / minute BP sitting:   140 / 69  (left arm) Cuff size:   regular  Vitals Entered By: Iran Planas CMA (Jul 27, 2007 9:08 AM)                  Chief Complaint:  Check up.  History of Present Illness: was tying to lose wt since last visit instead of taking the glimeparide 1 mg - ; walking a  bit less lately though; denies polys or low sugars ; no recent CP except for acid releived by rolaids; diovan hct and other meds too expensive    Updated Prior Medication List: OMEPRAZOLE 20 MG  CPDR (OMEPRAZOLE) 2po qd SIMVASTATIN 80 MG  TABS (SIMVASTATIN) 1po qd LISINOPRIL-HYDROCHLOROTHIAZIDE 20-12.5 MG  TABS (LISINOPRIL-HYDROCHLOROTHIAZIDE) 1 by mouth qd ECOTRIN LOW STRENGTH 81 MG  TBEC (ASPIRIN) 1 po qd GLIMEPIRIDE 1 MG  TABS (GLIMEPIRIDE) 1po once daily (as needed) METFORMIN HCL 500 MG  TABS (METFORMIN HCL) 2 tabs by mouth bid ROLAIDS 334 MG  CHEW (DIHYDROXYALUMINUM SOD CARB) as directed as needed ADULT ASPIRIN EC LOW STRENGTH 81 MG  TBEC (ASPIRIN) 1po qd  Current Allergies (reviewed today): No known allergies   Past Medical History:    Reviewed history from 01/24/2007 and no changes required:       rhumatic fever as child       PUD       DJD       GERD       Hypertension       Hyperlipidemia       Diabetes mellitus, type II       Allergic rhinitis       Benign prostatic hypertrophy  Past Surgical History:    Reviewed history from 01/24/2007 and no changes required:       Total knee replacement - left knee 2001       s/p Laser TURP       s/p left knee arthroscopy   Social History:    Reviewed history from 01/24/2007 and no changes required:       Former Smoker       Alcohol use-no    Review of Systems       due for PSA in August per Dr Jeffie Pollock, some ongoing fatigue but o/w non-specific   Physical Exam   General:     Well-developed,well-nourished,in no acute distress; alert,appropriate and cooperative throughout examination Head:     Normocephalic and atraumatic without obvious abnormalities. No apparent alopecia or balding. Eyes:     No corneal or conjunctival inflammation noted. EOMI. Perrla. Ears:     External ear exam shows no significant lesions or deformities.  Otoscopic examination reveals clear canals, tympanic membranes are intact bilaterally without bulging, retraction, inflammation or discharge. Hearing is grossly normal bilaterally. Nose:     External nasal examination shows no deformity or inflammation. Nasal mucosa are pink and moist without lesions or exudates. Mouth:     Oral mucosa and oropharynx without lesions or exudates.  Teeth in good repair. Neck:     No deformities, masses, or tenderness noted. Lungs:     Normal respiratory effort, chest expands symmetrically. Lungs are clear to auscultation, no crackles or wheezes. Heart:     Normal rate and  regular rhythm. S1 and S2 normal without gallop, murmur, click, rub or other extra sounds. Extremities:     no edema, no ulcers     Impression & Recommendations:  Problem # 1:  DIABETES MELLITUS, TYPE II (ICD-250.00)  His updated medication list for this problem includes:    Lisinopril-hydrochlorothiazide 20-12.5 Mg Tabs (Lisinopril-hydrochlorothiazide) .Marland Kitchen... 1 by mouth qd    Ecotrin Low Strength 81 Mg Tbec (Aspirin) .Marland Kitchen... 1 po qd    Glimepiride 1 Mg Tabs (Glimepiride) .Marland Kitchen... 1po once daily (as needed)    Metformin Hcl 500 Mg Tabs (Metformin hcl) .Marland Kitchen... 2 tabs by mouth bid    Adult Aspirin Ec Low Strength 81 Mg Tbec (Aspirin) .Marland Kitchen... 1po qd not taking the glimeparide at this point - will check the a1c Labs Reviewed: HgBA1c: 7.5 (04/27/2007)   Creat: 0.9 (04/27/2007)     Orders: TLB-A1C / Hgb A1C (Glycohemoglobin) (83036-A1C) TLB-Lipid Panel (80061-LIPID) TLB-Microalbumin/Creat Ratio, Urine (82043-MALB) TLB-BMP (Basic  Metabolic Panel-BMET) (56861-UOHFGBM)   Problem # 2:  HYPERLIPIDEMIA (ICD-272.4)  His updated medication list for this problem includes:    Simvastatin 80 Mg Tabs (Simvastatin) .Marland Kitchen... 1po qd lipitor changed to generic zocor per pt request Labs Reviewed: Chol: 135 (04/27/2007)   HDL: 45.3 (04/27/2007)   LDL: 74 (04/27/2007)   TG: 77 (04/27/2007) SGOT: 34 (01/24/2007)   SGPT: 28 (01/24/2007)   Problem # 3:  HYPERTENSION (ICD-401.9)  His updated medication list for this problem includes:    Lisinopril-hydrochlorothiazide 20-12.5 Mg Tabs (Lisinopril-hydrochlorothiazide) .Marland Kitchen... 1 by mouth qd diovanhct changed to above. check BP at home BP today: 140/69 Prior BP: 128/69 (04/27/2007)  Labs Reviewed: Creat: 0.9 (04/27/2007) Chol: 135 (04/27/2007)   HDL: 45.3 (04/27/2007)   LDL: 74 (04/27/2007)   TG: 77 (04/27/2007)   Problem # 4:  FATIGUE (ICD-780.79) exam o/w bening, cont to monitor Orders: TLB-CBC Platelet - w/Differential (85025-CBCD) TLB-Hepatic/Liver Function Pnl (80076-HEPATIC) TLB-TSH (Thyroid Stimulating Hormone) (84443-TSH)   Complete Medication List: 1)  Omeprazole 20 Mg Cpdr (Omeprazole) .... 2po qd 2)  Simvastatin 80 Mg Tabs (Simvastatin) .Marland Kitchen.. 1po qd 3)  Lisinopril-hydrochlorothiazide 20-12.5 Mg Tabs (Lisinopril-hydrochlorothiazide) .Marland Kitchen.. 1 by mouth qd 4)  Ecotrin Low Strength 81 Mg Tbec (Aspirin) .Marland Kitchen.. 1 po qd 5)  Glimepiride 1 Mg Tabs (Glimepiride) .Marland Kitchen.. 1po once daily (as needed) 6)  Metformin Hcl 500 Mg Tabs (Metformin hcl) .... 2 tabs by mouth bid 7)  Rolaids 334 Mg Chew (Dihydroxyaluminum sod carb) .... As directed as needed 8)  Adult Aspirin Ec Low Strength 81 Mg Tbec (Aspirin) .Marland Kitchen.. 1po qd   Patient Instructions: 1)  you will have blood work today  2)  continue all medications that you may have been taking previously 3)  Please schedule a follow-up appointment in 6 months.   Prescriptions: METFORMIN HCL 500 MG  TABS (METFORMIN HCL) 2 tabs by mouth bid  #360 x  3   Entered and Authorized by:   [name] MD   Signed by:   [name] MD on 07/27/2007   Method used:   Electronically sent to ...       [facility] [place], [place]  [address]       Ph: [phone]       Fax: [phone]   RxID:   [id number] METFORMIN HCL 500 MG  TABS (METFORMIN HCL) 2 tabs by mouth bid  #360 x 3   Entered and Authorized by:  [name] MD   Signed by:   [name] MD on 07/27/2007   Method used:   Print then Give to Patient   RxID:   [id number] SIMVASTATIN 80 MG  TABS (SIMVASTATIN) 1po qd  #90 x 3   Entered and Authorized by:   [name] MD   Signed by:   [name] MD on 07/27/2007   Method used:   Print then Give to Patient   RxID:   [id number] OMEPRAZOLE 20 MG  CPDR (OMEPRAZOLE) 2po qd  #180 x 3   Entered and Authorized by:   [name] MD   Signed by:   [name] MD on 07/27/2007   Method used:   Print then Give to Patient   RxID:   [id number] LISINOPRIL-HYDROCHLOROTHIAZIDE 20-12.5 MG  TABS (LISINOPRIL-HYDROCHLOROTHIAZIDE) 1 by mouth qd  #90 x 3   Entered and Authorized by:   [name] MD   Signed by:   [name] MD on 07/27/2007   Method used:   Print then Give to Patient   RxID:   [id number]  ]

## 2010-04-10 NOTE — Assessment & Plan Note (Signed)
Summary: 6 mos f/u #/cd   Vital Signs:  Patient profile:   76 year old male Height:      75 inches Weight:      191.50 pounds BMI:     24.02 O2 Sat:      97 % on Room air Temp:     98.1 degrees F oral Pulse rate:   59 / minute BP sitting:   128 / 68  (left arm) Cuff size:   regular  Vitals Entered By: Shirlean Mylar Ewing CMA (AAMA) (September 17, 2009 9:05 AM)  O2 Flow:  Room air  Preventive Care Screening     declines immuizations today, due for colonoscopy  CC: 6 moonth followup/RE   CC:  6 moonth followup/RE.  History of Present Illness: here to f/u;  not tkaing hte [place]  - cbg's seem ok without it in the low 100;  Pt denies CP, sob, doe, wheezing, orthopnea, pnd, worsening LE edema, palps, dizziness or syncope  Pt denies new neuro symptoms such as headache, facial or extremity weakness  Pt denies polydipsia, polyuria, or low sugar symptoms such as shakiness improved with eating.  Overall good compliance with meds, trying to follow low chol, DM diet, wt gradually less over the past yr.  Overall wheezing improved and not using the albuterol much at all, denies cough and sob, no night time awakenings.    Here for wellness Diet: Heart Healthy or DM if diabetic Physical Activities: trying to be active;  uses ropes for excercises, and walks a obut half mile per day Depression/mood screen: Negative Hearing: Intact bilateral Visual Acuity: Grossly normal, gets exam yearly, last exam one month ago ADL's: Capable  Fall Risk: None Home Safety: Good Cognitive Impairment:  Gen appearance, affect, speech, memory, attention & motor skills grossly intact End-of-Life Planning: Advance directive - Full code/I agree   Preventive Screening-Counseling & Management      Drug Use:  no.    Problems Prior to Update: 1)  Pers Hx Tobacco Use Presenting Hazards Health  (ICD-V15.82) 2)  Preventive Health Care  (ICD-V70.0) 3)  Vitamin B12 Deficiency  (ICD-266.2) 4)  Special Screening Malig Neoplasms  Other Sites  (ICD-V76.49) 5)  Fatigue  (ICD-780.79) 6)  Insomnia  (ICD-780.52) 7)  Bronchitis, Acute  (ICD-466.0) 8)  Peripheral Neuropathy, Feet  (ICD-355.8) 9)  Leg Pain, Bilateral  (ICD-729.5) 10)  Fatigue  (ICD-780.79) 11)  Benign Prostatic Hypertrophy  (ICD-600.00) 12)  Allergic Rhinitis  (ICD-477.9) 13)  Cough  (ICD-786.2) 14)  Diabetes Mellitus, Type II  (ICD-250.00) 15)  Hyperlipidemia  (ICD-272.4) 16)  Hypertension  (ICD-401.9) 17)  Gerd  (ICD-530.81)  Medications Prior to Update: 1)  Omeprazole 20 Mg  Cpdr (Omeprazole) .... 2po Once Daily 2)  Simvastatin 80 Mg  Tabs (Simvastatin) .Marland Kitchen.. 1po Once Daily 3)  Hyzaar 100-25 Mg Tabs (Losartan Potassium-Hctz) .Marland Kitchen.. 1 By Mouth Once Daily (Generic) 4)  Ecotrin Low Strength 81 Mg  Tbec (Aspirin) .Marland Kitchen.. 1 Po Qd 5)  Metformin Hcl 500 Mg  Tabs (Metformin Hcl) .... 2 Tabs By Mouth Two Times A Day 6)  Proair Hfa 108 (90 Base) Mcg/act Aers (Albuterol Sulfate) .... 2 Puffs Four Times Per Day As Needed 7)  Cetirizine Hcl 10 Mg Tabs (Cetirizine Hcl) .Marland Kitchen.. 1 By Mouth Once Daily As Needed 8)  Cyanocobalamin 1000 Mcg/ml Soln (Cyanocobalamin) .Marland Kitchen.. 1 Cc Im Q Month 9)  Januvia 100 Mg Tabs (Sitagliptin Phosphate) .Marland Kitchen.. 1po Once Daily 10)  Azithromycin 250 Mg Tabs (Azithromycin) .... 2po Qd  For 1 Day, Then 1po Qd For 4days, Then Stop 11)  Hydrocodone-Homatropine 5-1.5 Mg/47m Syrp (Hydrocodone-Homatropine) ..Marland Kitchen. 1 Tsp By Mouth Q 6 Hrs As Needed Cough  Current Medications (verified): 1)  Omeprazole 20 Mg  Cpdr (Omeprazole) .... 2po Once Daily 2)  Simvastatin 80 Mg  Tabs (Simvastatin) ..Marland Kitchen. 1po Once Daily 3)  Hyzaar 100-25 Mg Tabs (Losartan Potassium-Hctz) ..Marland Kitchen. 1 By Mouth Once Daily (Generic) 4)  Ecotrin Low Strength 81 Mg  Tbec (Aspirin) ..Marland Kitchen. 1 Po Qd 5)  Metformin Hcl 500 Mg  Tabs (Metformin Hcl) .... 2 Tabs By Mouth Two Times A Day 6)  Proair Hfa 108 (90 Base) Mcg/act Aers (Albuterol Sulfate) .... 2 Puffs Four Times Per Day As Needed 7)  Cetirizine Hcl 10 Mg  Tabs (Cetirizine Hcl) ..Marland Kitchen. 1 By Mouth Once Daily As Needed 8)  Cyanocobalamin 1000 Mcg/ml Soln (Cyanocobalamin) ..Marland Kitchen. 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 Former Smoker Alcohol use-no Married 5 children retired 2004 [place] Drug Use:  no  Review of Systems  The patient denies anorexia, fever, weight loss, weight gain, vision loss, decreased hearing, hoarseness, chest pain, syncope, dyspnea on exertion, peripheral edema, prolonged cough, headaches, hemoptysis, abdominal pain, melena, hematochezia, severe indigestion/heartburn, hematuria, muscle weakness, suspicious skin lesions, transient blindness, difficulty walking, depression, unusual weight change, abnormal bleeding, enlarged lymph nodes, and angioedema.         all otherwise negative per pt -  has mild ongoing fatigue but mild at best, no OSA symptoms   Physical Exam  General:  alert and overweight-appearing.   Head:  normocephalic and atraumatic.   Eyes:  vision grossly intact, pupils equal, and pupils round.   Ears:  R ear normal and L ear normal.   Nose:  no external deformity and no nasal discharge.   Mouth:  no gingival abnormalities and pharynx pink and moist.   Neck:  supple and no  masses.   Lungs:  normal respiratory effort and normal breath sounds.   Heart:  normal rate and regular rhythm.   Abdomen:  soft, non-tender, and normal bowel sounds.   Msk:  no joint tenderness and no joint swelling.   Extremities:  no edema, no erythema  Neurologic:  cranial nerves II-XII intact and strength normal in all extremities.   Skin:  color normal and no rashes.   Psych:  not anxious appearing and not depressed appearing.     Impression & Recommendations:  Problem # 1:  Preventive Health Care (ICD-V70.0)  Overall doing well, age appropriate education and counseling updated and referral for appropriate preventive services done unless declined, immunizations up to date or declined, diet counseling done if overweight, urged to quit smoking if smokes , most recent labs reviewed and current ordered if appropriate, ecg reviewed or declined (interpretation per ECG scanned in the EMR if done); information regarding Medicare Prevention requirements given if appropriate;  speciality referrals updated as appropriate ;  will refer for screenin colonoscopoy, also with signifcant smoking in the past - will need aortic u/s  Orders: Radiology Referral (Radiology) Gastroenterology Referral (GI) First annual wellness visit with prevention plan  (S0630)  Problem # 2:  DIABETES MELLITUS, TYPE II (ICD-250.00)  The following medications were removed from the medication list:    Januvia 100 Mg Tabs (Sitagliptin phosphate) .Marland Kitchen... 1po once daily His updated medication list for this problem includes:    Hyzaar 100-25 Mg Tabs (Losartan potassium-hctz) .Marland Kitchen... 1 by mouth once daily (generic)    Ecotrin Low Strength 81 Mg Tbec (Aspirin) .Marland Kitchen... 1 po qd    Metformin Hcl 500 Mg Tabs (Metformin hcl) .Marland Kitchen... 2 tabs by mouth two times a day  Orders: TLB-Microalbumin/Creat Ratio, Urine (82043-MALB) TLB-Lipid Panel (80061-LIPID) TLB-A1C / Hgb A1C (Glycohemoglobin) (83036-A1C)  Labs Reviewed: Creat: 0.97  (03/23/2009)    Reviewed HgBA1c results: 7.0 (03/22/2009)  6.8 (09/06/2008) stable overall by hx and exam, ok to continue meds/tx as is   Problem # 3:  HYPERLIPIDEMIA (ICD-272.4)  His updated medication list for this problem includes:    Simvastatin 80 Mg Tabs (Simvastatin) .Marland Kitchen... 1po once daily  Labs Reviewed: SGOT: 21 (07/25/2008)   SGPT: 16 (07/25/2008)   HDL:54 (03/23/2009), 52.30 (07/25/2008)  LDL:72 (03/23/2009), 68 (07/25/2008)  Chol:147 (03/23/2009), 130 (07/25/2008)  Trig:106 (03/23/2009), 48.0 (07/25/2008) stable overall by hx and exam, ok to continue meds/tx as is   Problem # 4:  HYPERTENSION (ICD-401.9)  His updated medication list for this problem includes:    Hyzaar 100-25 Mg Tabs (Losartan potassium-hctz) .Marland Kitchen... 1 by mouth once daily (generic)  BP today: 128/68 Prior BP: 140/62 (05/23/2009)  Labs Reviewed: K+: 4.5 (03/23/2009) Creat: : 0.97 (03/23/2009)   Chol: 147 (03/23/2009)   HDL: 54 (03/23/2009)   LDL: 72 (03/23/2009)   TG: 106 (03/23/2009) stable overall by hx and exam, ok to continue meds/tx as is   Problem # 5:  VITAMIN B12 DEFICIENCY (ICD-266.2) urged compliacne with home admin  Problem # 6:  FATIGUE (ICD-780.79) exam benign, to check labs below; follow with expectant management  Orders: T-Vitamin D (25-Hydroxy) (16010-93235) TLB-BMP (Basic Metabolic Panel-BMET) (57322-GURKYHC) TLB-CBC Platelet - w/Differential (85025-CBCD) TLB-Hepatic/Liver Function Pnl (80076-HEPATIC) TLB-TSH (Thyroid Stimulating Hormone) (84443-TSH) TLB-Sedimentation Rate (ESR) (85652-ESR) TLB-IBC Pnl (Iron/FE;Transferrin) (83550-IBC) TLB-B12 + Folate Pnl (62376_28315-V76/HYW) TLB-Udip ONLY (81003-UDIP)  Complete Medication List: 1)  Omeprazole 20 Mg Cpdr (Omeprazole) .... 2po once daily 2)  Simvastatin 80 Mg Tabs (Simvastatin) .Marland Kitchen.. 1po once daily 3)  Hyzaar 100-25 Mg Tabs (Losartan potassium-hctz) .Marland Kitchen.. 1 by mouth once daily (generic) 4)  Ecotrin Low Strength 81 Mg Tbec  (Aspirin) .Marland Kitchen.. 1 po qd 5)  Metformin Hcl 500 Mg Tabs (Metformin hcl) .... 2 tabs by mouth two times a day 6)  Proair Hfa 108 (90 Base) Mcg/act Aers (Albuterol sulfate) .... 2 puffs four times per day as needed 7)  Cetirizine Hcl 10 Mg Tabs (Cetirizine hcl) .Marland Kitchen.. 1 by mouth once daily as needed 8)  Cyanocobalamin 1000 Mcg/ml Soln (Cyanocobalamin) .Marland Kitchen.. 1 cc im q month  Patient Instructions: 1)  You will be contacted about the referral(s) to: colonoscopy, and the aoritc ultrasound 2)  Please go to the Lab in the basement for your blood and/or urine tests today  3)  Please schedule a follow-up appointment in 6 months, or sooner if needed

## 2010-04-10 NOTE — Assessment & Plan Note (Signed)
Summary: FU / $50 /NWS   Vital Signs:  Patient profile:   76 year old male Height:      75 inches Weight:      193.38 pounds BMI:     24.26 O2 Sat:      98 % Temp:     98.0 degrees F oral Pulse rate:   76 / minute BP sitting:   140 / 66  (right arm) Cuff size:   regular  Vitals Entered By: Hector Brunswick (September 06, 2008 10:24 AM) CC: f/u appt   CC:  f/u appt.  History of Present Illness: here to f/u recent labs - found to have low vit b12 and wants to know if he can do shots at home himself (or at least have the wife do the shots , as she also does insulin shots); he denies polys or low sugars, good compliance with diet adnd meds but unable to lose wt so far; denies CP, sob, doe, wheezing, orthopnea, pnd or LE edema; cough overall improved from last visit with the allergy med tx but still persists somewhat but also only using the allergy meds as needed ; no palp or syncope  Problems Prior to Update: 1)  Vitamin B12 Deficiency  (ICD-266.2) 2)  Special Screening Malig Neoplasms Other Sites  (ICD-V76.49) 3)  Fatigue  (ICD-780.79) 4)  Insomnia  (ICD-780.52) 5)  Bronchitis, Acute  (ICD-466.0) 6)  Peripheral Neuropathy, Feet  (ICD-355.8) 7)  Leg Pain, Bilateral  (ICD-729.5) 8)  Fatigue  (ICD-780.79) 9)  Benign Prostatic Hypertrophy  (ICD-600.00) 10)  Allergic Rhinitis  (ICD-477.9) 11)  Cough  (ICD-786.2) 12)  Diabetes Mellitus, Type II  (ICD-250.00) 13)  Hyperlipidemia  (ICD-272.4) 14)  Hypertension  (ICD-401.9) 15)  Gerd  (ICD-530.81)  Medications Prior to Update: 1)  Omeprazole 20 Mg  Cpdr (Omeprazole) .... 2po Once Daily 2)  Simvastatin 80 Mg  Tabs (Simvastatin) .Marland Kitchen.. 1po Once Daily 3)  Hyzaar 100-12.5 Mg Tabs (Losartan Potassium-Hctz) .Marland Kitchen.. 1po Once Daily  (Generic) 4)  Ecotrin Low Strength 81 Mg  Tbec (Aspirin) .Marland Kitchen.. 1 Po Qd 5)  Metformin Hcl 500 Mg  Tabs (Metformin Hcl) .... 2 Tabs By Mouth Two Times A Day 6)  Rolaids 334 Mg  Chew (Dihydroxyaluminum Sod Carb) .... As Directed  As Needed 7)  Proair Hfa 108 (90 Base) Mcg/act Aers (Albuterol Sulfate) .... 2 Puffs Four Times Per Day As Needed 8)  Cetirizine Hcl 10 Mg Tabs (Cetirizine Hcl) .Marland Kitchen.. 1 By Mouth Once Daily As Needed 9)  Cyanocobalamin 1000 Mcg/ml Soln (Cyanocobalamin) .Marland Kitchen.. 1 Cc Im Q Month 10)  Januvia 100 Mg Tabs (Sitagliptin Phosphate) .Marland Kitchen.. 1po Once Daily  Current Medications (verified): 1)  Omeprazole 20 Mg  Cpdr (Omeprazole) .... 2po Once Daily 2)  Simvastatin 80 Mg  Tabs (Simvastatin) .Marland Kitchen.. 1po Once Daily 3)  Hyzaar 100-25 Mg Tabs (Losartan Potassium-Hctz) .Marland Kitchen.. 1 By Mouth Once Daily (Generic) 4)  Ecotrin Low Strength 81 Mg  Tbec (Aspirin) .Marland Kitchen.. 1 Po Qd 5)  Metformin Hcl 500 Mg  Tabs (Metformin Hcl) .... 2 Tabs By Mouth Two Times A Day 6)  Rolaids 334 Mg  Chew (Dihydroxyaluminum Sod Carb) .... As Directed As Needed 7)  Proair Hfa 108 (90 Base) Mcg/act Aers (Albuterol Sulfate) .... 2 Puffs Four Times Per Day As Needed 8)  Cetirizine Hcl 10 Mg Tabs (Cetirizine Hcl) .Marland Kitchen.. 1 By Mouth Once Daily As Needed 9)  Cyanocobalamin 1000 Mcg/ml Soln (Cyanocobalamin) .Marland Kitchen.. 1 Cc Im Q Month 10)  Januvia  100 Mg Tabs (Sitagliptin Phosphate) .Marland Kitchen.. 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   Head:  normocephalic and atraumatic.   Eyes:  vision grossly intact, pupils equal, and  pupils round.   Ears:  R ear normal and L ear normal.   Nose:  no external deformity and no nasal discharge.   Mouth:  no gingival abnormalities and pharynx pink and moist.   Neck:  supple and no masses.   Lungs:  normal respiratory effort and normal breath sounds.   Heart:  normal rate and regular rhythm.   Extremities:  no edema, no ulcers    Impression & Recommendations:  Problem # 1:  VITAMIN B12 DEFICIENCY (ICD-266.2)  for initial tx today, and thereafter pt plans to give own shots at home - gave rx  Orders: Vit B12 1000 mcg (J3420) Admin of Therapeutic Inj  intramuscular or subcutaneous (81017)  Problem # 2:  COUGH (ICD-786.2) improved, cxr neg, ok to follow - encouraged use of allergy meds daily to further improve ; cont PPI, doubt asthma but cant r/o completely; consider ENT or pulm eval  Problem # 3:  HYPERTENSION (ICD-401.9)  His updated medication list for this problem includes:    Hyzaar 100-25 Mg Tabs (Losartan potassium-hctz) .Marland Kitchen... 1 by mouth once daily (generic) to increase the strength to above for better control  Problem # 4:  DIABETES MELLITUS, TYPE II (ICD-250.00)  His updated medication list for this problem includes:    Hyzaar 100-25 Mg Tabs (Losartan potassium-hctz) .Marland Kitchen... 1 by mouth once daily (generic)    Ecotrin Low Strength 81 Mg Tbec (Aspirin) .Marland Kitchen... 1 po qd    Metformin Hcl 500 Mg Tabs (Metformin hcl) .Marland Kitchen... 2 tabs by mouth two times a day    Januvia 100 Mg Tabs (Sitagliptin phosphate) .Marland Kitchen... 1po once daily  Orders: TLB-BMP (Basic Metabolic Panel-BMET) (51025-ENIDPOE) TLB-A1C / Hgb A1C (Glycohemoglobin) (83036-A1C)  Labs Reviewed: Creat: 1.0 (07/25/2008)    Reviewed HgBA1c results: 7.1 (07/25/2008)  6.8 (01/25/2008) stable overall by hx and exam, ok to continue meds/tx as is   Complete Medication List: 1)  Omeprazole 20 Mg Cpdr (Omeprazole) .... 2po once daily 2)  Simvastatin 80 Mg Tabs (Simvastatin) .Marland Kitchen.. 1po once daily 3)  Hyzaar 100-25 Mg  Tabs (Losartan potassium-hctz) .Marland Kitchen.. 1 by mouth once daily (generic) 4)  Ecotrin Low Strength 81 Mg Tbec (Aspirin) .Marland Kitchen.. 1 po qd 5)  Metformin Hcl 500 Mg Tabs (Metformin hcl) .... 2 tabs by mouth two times a day 6)  Rolaids 334 Mg Chew (Dihydroxyaluminum sod carb) .... As directed as needed 7)  Proair Hfa 108 (90 Base) Mcg/act Aers (Albuterol sulfate) .... 2 puffs four times per day as needed 8)  Cetirizine Hcl 10 Mg Tabs (Cetirizine hcl) .Marland Kitchen.. 1 by mouth once daily as needed 9)  Cyanocobalamin 1000 Mcg/ml Soln (Cyanocobalamin) .Marland Kitchen.. 1 cc im q month 10)  Januvia 100 Mg Tabs (Sitagliptin phosphate) .Marland KitchenMarland KitchenMarland Kitchen  1po once daily  Patient Instructions: 1)  you were instructed on the B12 shot today to be done monthly at home 2)  increase the Hyzaar strength to 100/25 mg - 1 per day 3)  Continue all medications that you may have been taking previously 4)  Please go to the Lab in the basement for your blood tests today 5)  Please schedule a follow-up appointment in 6 months Prescriptions: HYZAAR 100-25 MG TABS (LOSARTAN POTASSIUM-HCTZ) 1 by mouth once daily (GENERIC)  #90 x 3   Entered and Authorized by:   [name] MD   Signed by:   [name] MD on 09/06/2008   Method used:   Print then Give to Patient   RxID:   [id number] CYANOCOBALAMIN 1000 MCG/ML SOLN (CYANOCOBALAMIN) 1 cc IM q month  #1bottle x 11   Entered and Authorized by:   [name] MD   Signed by:   [name] MD on 09/06/2008   Method used:   Print then Give to Patient   RxID:   [id number]    Medication Administration  Injection # 1:    Medication: Vit B12 1000 mcg    Diagnosis: VITAMIN B12 DEFICIENCY (ICD-266.2)    Route: IM    Site: L deltoid    Exp Date: 02/06/2010    Lot #: [id number]    Mfr: [employer]    Patient tolerated injection without complications    Given by: Hector Brunswick (September 06, 2008 10:50 AM)  Orders Added: 1)  Vit B12 1000 mcg [J3420] 2)  Admin of Therapeutic Inj  intramuscular or  subcutaneous [96372] 3)  TLB-BMP (Basic Metabolic Panel-BMET) [25053-ZJQBHAL] 4)  TLB-A1C / Hgb A1C (Glycohemoglobin) [83036-A1C] 5)  Est. Patient Level IV [93790]

## 2010-04-10 NOTE — Letter (Signed)
Summary: Previsit letter  [name]  [place], [place] [address]   Phone: [phone]  Fax: [phone]       09/19/2009 MRN: [id number]  N[place], [place]  [address]  Dear Nicholas Patton,  Welcome to the Gastroenterology Division at [name].    You are scheduled to see a nurse for your pre-procedure visit on 11-04-09 at 10:00a.m.  on the 3rd floor at [name] [name].  We ask that you try to arrive at our office 15 minutes prior to your appointment time to allow for check-in.  Your nurse visit will consist of discussing your medical and surgical history, your immediate family medical history, and your medications.    Please bring a complete list of all your medications or, if you prefer, bring the medication bottles and we will list them.  We will need to be aware of both prescribed and over the counter drugs.  We will need to know exact dosage information as well.  If you are on blood thinners (Coumadin, Plavix, Aggrenox, Ticlid, etc.) please call our office today/prior to your appointment, as we need to consult with your physician about holding your medication.   Please be prepared to read and sign documents such as consent forms, a financial agreement, and acknowledgement forms.  If necessary, and with your consent, a friend or relative is welcome to sit-in on the nurse visit with you.  Please bring your insurance card so that we may make a copy of it.  If your insurance requires a referral to see a specialist, please bring your referral form from your primary care physician.  No co-pay is required for this nurse visit.     If you cannot keep your appointment, please call [phone] to cancel or reschedule prior to your appointment date.  This allows [place] the opportunity to schedule an appointment for another patient in need of care.    Thank you for choosing [name] for your medical  needs.  We appreciate the opportunity to care for you.  Please visit [place] at our website  to learn more about our practice.                     Sincerely.                                                                                                                   The Gastroenterology Division

## 2010-04-10 NOTE — Progress Notes (Signed)
Summary: leg problem  Phone Note Call from Patient Call back at Home Phone [phone]   Caller: Patient Call For: dr [name] Summary of Call: per pt call c/o leg problems states he legs fells like they are always asleep , feels like pens are sticking in his toes . does dr [name] need to see him or could he have an referral per pt  Initial call taken by: Nonah Mattes,  October 04, 2007 1:20 PM  Follow-up for Phone Call        ok to see with ROV Follow-up by: [name] MD,  October 04, 2007 1:30 PM  Additional Follow-up for Phone Call Additional follow up Details #1::        October 04, 2007 1:42 PM called pt to inform appt scheduled for 2;45 thursday  Additional Follow-up by: Nonah Mattes,  October 04, 2007 1:42 PM

## 2010-04-10 NOTE — Assessment & Plan Note (Signed)
Summary: COUGH/NWS   Vital Signs:  Patient profile:   76 year old male Height:      75 inches Weight:      192.25 pounds BMI:     24.12 O2 Sat:      93 % on Room air Temp:     98.2 degrees F oral Pulse rate:   59 / minute BP sitting:   140 / 78  (left arm) Cuff size:   regular  Vitals Entered By: Shirlean Mylar Ewing CMA Deborra Medina) (January 16, 2010 9:27 AM)  O2 Flow:  Room air CC: Cough and congestion/RE   CC:  Cough and congestion/RE.  History of Present Illness: here with acute  - c/o 2-3 days onset mild ST with now prod cough greenish sputum with mild wheeze  and sob, but not activity limiting;  Pt denies CP, orthopnea, pnd, worsening LE edema, palps, dizziness or syncope  Pt denies new neuro symptoms such as headache, facial or extremity weakness  Pt denies polydipsia, polyuria   Overall good compliance with meds, trying to follow low chol, DM diet, wt stable, little excercise however .  No  longer taking the zyrtec as did not seem to help with the nasal allergy symtpoms enough to continue.  Asks for refill of the proair as this did help in the past;  due for b12 shot today, declines flu shot.  No recent wt loss, night sweats, loss of appetite or other constitutional symptoms   Problems Prior to Update: 1)  Vitamin D Deficiency  (ICD-268.9) 2)  Wheezing  (ICD-786.07) 3)  Pers Hx Tobacco Use Presenting Hazards Health  (ICD-V15.82) 4)  Preventive Health Care  (ICD-V70.0) 5)  Vitamin B12 Deficiency  (ICD-266.2) 6)  Special Screening Malig Neoplasms Other Sites  (ICD-V76.49) 7)  Fatigue  (ICD-780.79) 8)  Insomnia  (ICD-780.52) 9)  Bronchitis, Acute  (ICD-466.0) 10)  Peripheral Neuropathy, Feet  (ICD-355.8) 11)  Leg Pain, Bilateral  (ICD-729.5) 12)  Fatigue  (ICD-780.79) 13)  Benign Prostatic Hypertrophy  (ICD-600.00) 14)  Allergic Rhinitis  (ICD-477.9) 15)  Cough  (ICD-786.2) 16)  Diabetes Mellitus, Type II  (ICD-250.00) 17)  Hyperlipidemia  (ICD-272.4) 18)  Hypertension   (ICD-401.9) 19)  Gerd  (ICD-530.81)  Medications Prior to Update: 1)  Omeprazole 20 Mg  Cpdr (Omeprazole) .... 2po Once Daily 2)  Simvastatin 80 Mg  Tabs (Simvastatin) .Marland Kitchen.. 1po Once Daily 3)  Hyzaar 100-25 Mg Tabs (Losartan Potassium-Hctz) .Marland Kitchen.. 1 By Mouth Once Daily (Generic) 4)  Ecotrin Low Strength 81 Mg  Tbec (Aspirin) .Marland Kitchen.. 1 Po Qd 5)  Metformin Hcl 500 Mg  Tabs (Metformin Hcl) .... 2 Tabs By Mouth Two Times A Day 6)  Proair Hfa 108 (90 Base) Mcg/act Aers (Albuterol Sulfate) .... 2 Puffs Four Times Per Day As Needed 7)  Cetirizine Hcl 10 Mg Tabs (Cetirizine Hcl) .Marland Kitchen.. 1 By Mouth Once Daily As Needed 8)  Cyanocobalamin 1000 Mcg/ml Soln (Cyanocobalamin) .Marland Kitchen.. 1 Cc Im Q Month  Current Medications (verified): 1)  Omeprazole 20 Mg  Cpdr (Omeprazole) .... 2po Once Daily 2)  Simvastatin 80 Mg  Tabs (Simvastatin) .Marland Kitchen.. 1po Once Daily 3)  Hyzaar 100-25 Mg Tabs (Losartan Potassium-Hctz) .Marland Kitchen.. 1 By Mouth Once Daily (Generic) 4)  Ecotrin Low Strength 81 Mg  Tbec (Aspirin) .Marland Kitchen.. 1 Po Qd 5)  Metformin Hcl 500 Mg  Tabs (Metformin Hcl) .... 2 Tabs By Mouth Two Times A Day 6)  Proair Hfa 108 (90 Base) Mcg/act Aers (Albuterol Sulfate) .... 2 Puffs Four Times  Per Day As Needed 7)  Cyanocobalamin 1000 Mcg/ml Soln (Cyanocobalamin) .Marland Kitchen.. 1 Cc Im Q Month 8)  Azithromycin 250 Mg Tabs (Azithromycin) .... 2po Qd For 1 Day, Then 1po Qd For 4days, Then Stop 9)  Hydrocodone-Homatropine 5-1.5 Mg/21m Syrp (Hydrocodone-Homatropine) ..Marland Kitchen. 1 Tsp By Mouth Q 6 Hrs As Needed 10)  [facility] Vitamin D3 1000 Unit Caps (Cholecalciferol) ..Marland Kitchen. 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  , mild ill  Head:  normocephalic and atraumatic.   Eyes:  vision grossly intact, pupils equal, and pupils round.   Ears:  bilat tm's mild red , sinus nontender, canals clear Nose:  nasal dischargemucosal pallor and mucosal edema.   Mouth:  pharyngeal erythema and fair dentition.   Neck:  supple and no masses.   Lungs:  normal respiratory effort, R decreased breath sounds, and L decreased breath sounds., no frank wheezing noted or rales   Heart:  normal rate and regular rhythm.   Extremities:  no edema, no erythema    Impression & Recommendations:  Problem # 1:  BRONCHITIS, ACUTE (ICD-466.0)  His updated medication list for this problem includes:    Proair Hfa 108 (90 Base) Mcg/act Aers (Albuterol sulfate) ..Marland Kitchen.. 2 puffs four times per day as needed    Azithromycin 250 Mg Tabs (Azithromycin) ..Marland Kitchen.. 2po qd for 1 day, then 1po qd for 4days, then stop    Hydrocodone-homatropine 5-1.5 Mg/571mSyrp (Hydrocodone-homatropine) ...Marland Kitchen. 1 tsp by mouth q 6 hrs as needed treat as above, f/u any worsening signs or symptoms   Problem # 2:  WHEEZING (ICD-786.07) mild at best, for proair hfa as needed ; I dont feel steroid tx needed at this time  Problem # 3:  VITAMIN B12 DEFICIENCY (ICD-266.2) for b12 Im today  Problem # 4:  VITAMIN D DEFICIENCY (ICD-268.9) d/w pt - for 1000 units per day, most recent lab reviewed  Problem # 5:  HYPERTENSION (ICD-401.9)  His updated medication list for this problem includes:    Hyzaar 100-25 Mg Tabs (Losartan potassium-hctz) ...Marland Kitchen. 1 by mouth once daily (generic)  BP today: 140/78 Prior BP: 128/68 (09/17/2009)  Labs Reviewed: K+: 4.5  (09/17/2009) Creat: : 0.9 (09/17/2009)   Chol: 129 (09/17/2009)   HDL: 50.60 (09/17/2009)   LDL: 66 (09/17/2009)   TG: 64.0 (09/17/2009) mild elev today, likely situational, ok to follow, continue same treatment   Problem # 6:  DIABETES MELLITUS, TYPE II (ICD-250.00)  His updated medication list for this problem includes:    Hyzaar 100-25 Mg Tabs (Losartan potassium-hctz) ...Marland Kitchen. 1 by mouth once daily (generic)    Ecotrin Low Strength 81 Mg Tbec (Aspirin) ...Marland Kitchen. 1 po qd    Metformin Hcl 500 Mg Tabs (Metformin hcl) ...Marland Kitchen. 2 tabs by mouth two times a day  Labs Reviewed: Creat: 0.9 (09/17/2009)    Reviewed HgBA1c results: 7.3 (09/17/2009)  7.0 (03/22/2009) mild uncontrolled  with last labs with goal a1c < 7.0;  asympt now, Continue all previous medications as before this visit , follow for onset polys or cbg > 200  Complete Medication List: 1)  Omeprazole 20 Mg Cpdr (Omeprazole) .... 2po once daily 2)  Simvastatin 80 Mg Tabs (Simvastatin) .Marland Kitchen.. 1po once daily 3)  Hyzaar 100-25 Mg Tabs (Losartan potassium-hctz) .Marland Kitchen.. 1 by mouth once daily (generic) 4)  Ecotrin Low Strength 81 Mg Tbec (Aspirin) .Marland Kitchen.. 1 po qd 5)  Metformin Hcl 500 Mg Tabs (Metformin hcl) .... 2 tabs by mouth two times a day 6)  Proair Hfa 108 (90 Base) Mcg/act Aers (Albuterol sulfate) .... 2 puffs four times per day as needed 7)  Cyanocobalamin 1000 Mcg/ml Soln (Cyanocobalamin) .Marland Kitchen.. 1 cc im q month 8)  Azithromycin 250 Mg Tabs (Azithromycin) .... 2po qd for 1 day, then 1po qd for 4days, then stop 9)  Hydrocodone-homatropine 5-1.5 Mg/41m Syrp (Hydrocodone-homatropine) ..Marland Kitchen. 1 tsp by mouth q 6 hrs as needed 10)  Cvs Vitamin D3 1000 Unit Caps (Cholecalciferol) ..Marland Kitchen. 1po once daily  Patient Instructions: 1)  Please take all new medications as prescribed 2)  Continue all previous medications as before this visit  3)  Please schedule a follow-up appointment in 2 months as previously recommended Prescriptions: HYDROCODONE-HOMATROPINE  5-1.5 MG/5ML SYRP (HYDROCODONE-HOMATROPINE) 1 tsp by mouth q 6 hrs as needed  #6oz x 1   Entered and Authorized by:   JBiagio BorgMD   Signed by:   JBiagio BorgMD on 01/16/2010   Method used:   Print then Give to Patient   RxID:   [id number] 250 MG TABS (AZITHROMYCIN) 2po qd for 1 day, then 1po qd for 4days, then stop  #6 x 1   Entered and Authorized by:   JBiagio BorgMD   Signed by:   JBiagio BorgMD on 01/16/2010   Method used:   Print then Give to Patient   RxID::   [id number] HFA 108 (90 BASE) MCG/ACT AERS (ALBUTEROL SULFATE) 2 puffs four times per day as needed  #1 x 11   Entered and Authorized by:   JBiagio BorgMD   Signed by:   JBiagio BorgMD on 01/16/2010   Method used:   Print then Give to Patient   RxID:   [id number]   Orders Added: 1)  Est. Patient Level IV [[25053]

## 2010-04-10 NOTE — Miscellaneous (Signed)
Summary: lec pREVISIT/PREP  Clinical Lists Changes  Medications: Added new medication of MOVIPREP 100 GM  SOLR (PEG-KCL-NACL-NASULF-NA ASC-C) As per prep instructions. - Signed Rx of MOVIPREP 100 GM  SOLR (PEG-KCL-NACL-NASULF-NA ASC-C) As per prep instructions.;  #1 x 0;  Signed;  Entered by: Emerson Monte RN;  Authorized by: Sable Feil MD [facility];  Method used: Electronically to [facility] [phone]*, [address] [place], [place], [place]  [id number], Ph: [phone] or [phone], Fax: [phone] Observations: Added new observation of NKA: T (11/04/2009 9:53)    Prescriptions: MOVIPREP 100 GM  SOLR (PEG-KCL-NACL-NASULF-NA ASC-C) As per prep instructions.  #1 x 0   Entered by:   Emerson Monte RN   Authorized by:   Sable Feil MD [facility]   Signed by:   Emerson Monte RN on 11/04/2009   Method used:   Electronically to        [phone][facility] (retail)       [place], [place]  [id number]       Ph: [phone] or [phone]       Fax: [phone]   RxID:   [id number]

## 2010-04-10 NOTE — Letter (Signed)
Summary: [facility]  [facility]   Imported By: Bubba Hales 09/05/2009 09:52:50  _____________________________________________________________________  External Attachment:    Type:   Image     Comment:   External Document

## 2010-04-10 NOTE — Progress Notes (Signed)
----  Converted from flag ---- ---- 09/18/2009 8:43 AM, Lorraine Lax wrote: appt 8/4 @ 9:30 same directions as last pt  ---- 09/17/2009 4:44 PM, Ophelia Charter wrote: Thanks  ---- 09/17/2009 1:05 PM, Ophelia Charter wrote:   ---- 09/17/2009 9:42 AM, [name] MD wrote: The following orders have been entered for this patient and placed on Admin Hold:  Type:     Referral       Code:   Radiology Description:   Radiology Referral Order Date:   09/17/2009   Authorized By:   [name] MD Order #:   [id number] Clinical Notes:   Name of Test or Procedure: aortic ultrasound  Of What:  Special Instructions ------------------------------

## 2010-04-10 NOTE — Progress Notes (Signed)
Summary: Cough  Phone Note Call from Patient Call back at Home Phone [phone]   Caller: Patient Summary of Call: pt called stating that he has been coughing over the weekend and would like MD to Rx a cough syrup. I will advise pt of an OTC cough syrup. Robitussin or Delsym okay? Initial call taken by: Crissie Sickles, [place],  March 26, 2009 10:30 AM  Follow-up for Phone Call        ok to try delsym - let us know if does not work well Follow-up by: [name] MD,  March 26, 2009 10:34 AM  Additional Follow-up for Phone Call Additional follow up Details #1::        left message on machine for pt to return my call   Additional Follow-up by: Crissie Sickles, [place],  March 26, 2009 10:57 AM    Additional Follow-up for Phone Call Additional follow up Details #2::    pt informed Follow-up by: Crissie Sickles, [facility],  March 26, 2009 2:46 PM

## 2010-04-10 NOTE — Assessment & Plan Note (Signed)
Summary: PER ANN SCHED-FU BLOOD SUGAR-STC   Vital Signs:  Patient Profile:   76 Years Old Male Weight:      199 pounds Temp:     97.2 degrees F oral Pulse rate:   62 / minute BP sitting:   128 / 69  (right arm) Cuff size:   large  Pt. in pain?   no  Vitals Entered By: Elveria Royals (April 27, 2007 8:21 AM)                  Chief Complaint:  F/U on bld sugar.  History of Present Illness: overall doing well, no complaints, denies polys, no low sugars    Updated Prior Medication List: METFORMIN HCL 1000 MG  TABS (METFORMIN HCL) 1 by mouth bid NEXIUM 40 MG  CPDR (ESOMEPRAZOLE MAGNESIUM) 1 by mouth qd LIPITOR 40 MG  TABS (ATORVASTATIN CALCIUM) 1 by mouth qd DIOVAN HCT 160-12.5 MG  TABS (VALSARTAN-HYDROCHLOROTHIAZIDE) 1 by mouth qd ECOTRIN LOW STRENGTH 81 MG  TBEC (ASPIRIN) 1 po qd  Current Allergies (reviewed today): No known allergies   Past Medical History:    Reviewed history from 01/24/2007 and no changes required:       rhumatic fever as child       PUD       DJD       GERD       Hypertension       Hyperlipidemia       Diabetes mellitus, type II       Allergic rhinitis       Benign prostatic hypertrophy  Past Surgical History:    Reviewed history from 01/24/2007 and no changes required:       Total knee replacement - left knee 2001       s/p Laser TURP       s/p left knee arthroscopy   Family History:    Reviewed history from 01/24/2007 and no changes required:       brother with lung cancer  Social History:    Reviewed history from 01/24/2007 and no changes required:       Former Smoker       Alcohol use-no    Review of Systems       no cp or cv symptoms   Physical Exam  General:     Well-developed,well-nourished,in no acute distress; alert,appropriate and cooperative throughout examination Head:     Normocephalic and atraumatic without obvious abnormalities. No apparent alopecia or balding. Eyes:     No corneal or  conjunctival inflammation noted. EOMI. Perrla.  Ears:     External ear exam shows no significant lesions or deformities.  Otoscopic examination reveals clear canals, tympanic membranes are intact bilaterally without bulging, retraction, inflammation or discharge. Hearing is grossly normal bilaterally. Nose:     External nasal examination shows no deformity or inflammation. Nasal mucosa are pink and moist without lesions or exudates. Mouth:     Oral mucosa and oropharynx without lesions or exudates.  Teeth in good repair. Neck:     No deformities, masses, or tenderness noted. Lungs:     Normal respiratory effort, chest expands symmetrically. Lungs are clear to auscultation, no crackles or wheezes. Heart:     Normal rate and regular rhythm. S1 and S2 normal without gallop, murmur, click, rub or other extra sounds. Extremities:     No clubbing, cyanosis, edema, or deformity noted with normal full range of motion of all joints.  Impression & Recommendations:  Problem # 1:  DIABETES MELLITUS, TYPE II (ICD-250.00)  His updated medication list for this problem includes:    Metformin Hcl 1000 Mg Tabs (Metformin hcl) .Marland Kitchen... 1 by mouth bid    Diovan Hct 160-12.5 Mg Tabs (Valsartan-hydrochlorothiazide) .Marland Kitchen... 1 by mouth qd    Ecotrin Low Strength 81 Mg Tbec (Aspirin) .Marland Kitchen... 1 po qd on increased metformin - to check a1c Orders: TLB-A1C / Hgb A1C (Glycohemoglobin) (83036-A1C) TLB-Lipid Panel (80061-LIPID) TLB-BMP (Basic Metabolic Panel-BMET) (88737-BCAFQEH)   Problem # 2:  HYPERTENSION (ICD-401.9)  His updated medication list for this problem includes:    Diovan Hct 160-12.5 Mg Tabs (Valsartan-hydrochlorothiazide) .Marland Kitchen... 1 by mouth qd stable, cont meds as is  Problem # 3:  HYPERLIPIDEMIA (ICD-272.4)  His updated medication list for this problem includes:    Lipitor 40 Mg Tabs (Atorvastatin calcium) .Marland Kitchen... 1 by mouth qd  stable, cont meds as is  Complete Medication List: 1)  Metformin  Hcl 1000 Mg Tabs (Metformin hcl) .Marland Kitchen.. 1 by mouth bid 2)  Nexium 40 Mg Cpdr (Esomeprazole magnesium) .Marland Kitchen.. 1 by mouth qd 3)  Lipitor 40 Mg Tabs (Atorvastatin calcium) .Marland Kitchen.. 1 by mouth qd 4)  Diovan Hct 160-12.5 Mg Tabs (Valsartan-hydrochlorothiazide) .Marland Kitchen.. 1 by mouth qd 5)  Ecotrin Low Strength 81 Mg Tbec (Aspirin) .Marland Kitchen.. 1 po qd   Patient Instructions: 1)  continue all medications as prescribed 2)  you will have blood work today 3)  Please schedule a follow-up appointment in 3 months.    ]

## 2010-04-10 NOTE — Assessment & Plan Note (Signed)
Summary: COLD/ COUGH /NWS   Vital Signs:  Patient profile:   75 year old male Height:      75 inches Weight:      189 pounds BMI:     23.71 Temp:     97.1 degrees F oral Pulse rate:   79 / minute BP sitting:   126 / 62  (left arm)  Vitals Entered By: Doralee Albino (June 11, 2008 4:06 PM) CC: cold, cong, Is Patient Diabetic? No   CC:  cold, cong, and .  History of Present Illness: The patient presents with complaints of sore throat, fever, cough, sinus congestion and drainge of several days duration. Not better with OTC meds. Chest hurts with coughing. Can't sleep due to cough. Muscle aches are present.  The mucus is colored.   Current Medications (verified): 1)  Omeprazole 20 Mg  Cpdr (Omeprazole) .... 2po Qd 2)  Simvastatin 80 Mg  Tabs (Simvastatin) .Marland Kitchen.. 1po Qd 3)  Lisinopril-Hydrochlorothiazide 20-12.5 Mg  Tabs (Lisinopril-Hydrochlorothiazide) .Marland Kitchen.. 1 By Mouth Qd 4)  Ecotrin Low Strength 81 Mg  Tbec (Aspirin) .Marland Kitchen.. 1 Po Qd 5)  Metformin Hcl 500 Mg  Tabs (Metformin Hcl) .... 2 Tabs By Mouth Bid 6)  Rolaids 334 Mg  Chew (Dihydroxyaluminum Sod Carb) .... 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   Nose:  Erythematous throat mucosa and intranasal erythema.  Lungs:  CTA Heart:  RRR Skin:  turgor normal.   Psych:  Oriented X3.     Impression  & Recommendations:  Problem # 1:  BRONCHITIS, ACUTE (ICD-466.0) Assessment Comment Only  The following medications were removed from the medication list:    Azithromycin 250 Mg Tabs (Azithromycin) .Marland Kitchen... 2po qd for 1 day, then 1po qd for 4days, then stop His updated medication list for this problem includes:    Zithromax Z-pak 250 Mg Tabs (Azithromycin) ..... Use as directed    Tussionex Pennkinetic Er 8-10 Mg/58m Lqcr (Chlorpheniramine-hydrocodone) ..Marland KitchenMarland KitchenMarland KitchenMarland Kitchen5 ml by mouth two times a day as needed cough  Complete Medication List: 1)  Omeprazole 20 Mg Cpdr (Omeprazole) .... 2po qd 2)  Simvastatin 80 Mg Tabs (Simvastatin) ..Marland Kitchen. 1po qd 3)  Lisinopril-hydrochlorothiazide 20-12.5 Mg Tabs (Lisinopril-hydrochlorothiazide) ..Marland Kitchen. 1 by mouth qd 4)  Ecotrin Low Strength 81 Mg Tbec (Aspirin) ..Marland Kitchen. 1 po qd 5)  Metformin Hcl 500 Mg Tabs (Metformin hcl) .... 2 tabs by mouth bid 6)  Rolaids 334 Mg Chew (Dihydroxyaluminum sod carb) .... As directed as needed 7)  Zithromax Z-pak 250 Mg Tabs (Azithromycin) .... Use as directed 8)  Tussionex Pennkinetic Er 8-10  Mg/21m Lqcr (Chlorpheniramine-hydrocodone) .... 5 ml by mouth two times a day as needed cough  Patient Instructions: 1)  Call if you are not better in a reasonable ammount of time or if worse. Go to ER if feeling really bad!  Prescriptions: TUSSIONEX PENNKINETIC ER 8-10 MG/5ML LQCR (CHLORPHENIRAMINE-HYDROCODONE) 5 ml by mouth two times a day as needed cough  #50 ml x 0   Entered and Authorized by:   ACassandria AngerMD   Signed by:   ACassandria AngerMD on 06/11/2008   Method used:   Print then Give to Patient   RxID:   [id number] Z-PAK 250 MG  TABS (AZITHROMYCIN) Use as directed  #1 x 0   Entered and Authorized by:   ACassandria AngerMD   Signed by:   ACassandria AngerMD on 06/11/2008   Method used:   Electronically to        [phone] (retail)       [address] [id number]      Ph: [phone] [phone]      Fax: [phone]  RxID:   [id number]

## 2010-04-10 NOTE — Letter (Signed)
Summary: Patient Notice- Polyp Results  [facility]  [address] [place], [place] [address]   Phone: [phone]  Fax: [phone]        November 20, 2009 MRN: [id number]    Nicholas Patton [address] [place], [place]  [address]    Dear [name],  I am pleased to inform you that the colon polyp(s) removed during your recent colonoscopy was (were) found to be benign (no cancer detected) upon pathologic examination.  I recommend you have a repeat colonoscopy examination in 5_ years to look for recurrent polyps, as having colon polyps increases your risk for having recurrent polyps or even colon cancer in the future.  Should you develop new or worsening symptoms of abdominal pain, bowel habit changes or bleeding from the rectum or bowels, please schedule an evaluation with either your primary care physician or with me.  Additional information/recommendations:  _x_ No further action with gastroenterology is needed at this time. Please      follow-up with your primary care physician for your other healthcare      needs.  __ Please call [phone] to schedule a return visit to review your      situation.  __ Please keep your follow-up visit as already scheduled.  __ Continue treatment plan as outlined the day of your exam.  Please call us if you are having persistent problems or have questions about your condition that have not been fully answered at this time.  Sincerely,  Sable Feil MD [facility]  This letter has been electronically signed by your physician.  Appended Document: Patient Notice- Polyp Results letter mailed

## 2010-04-10 NOTE — Progress Notes (Signed)
Summary: Sore throat  Phone Note Call from Patient Call back at Home Phone [phone]   Caller: Patient Summary of Call: Patient catching cold, has sore throat, headache, but no fever.  Taking Tussionex.  Wants call-back. Initial call taken by: Jacqualine Code Site Manager,  August 25, 2007 8:53 AM  Follow-up for Phone Call        Returned call to pt who c/o headache, slight sore throat with phlem. Patietn transferred to scheduling to make appt for today Follow-up by: Estell Harpin CMA,  August 25, 2007 9:20 AM

## 2010-04-10 NOTE — Assessment & Plan Note (Signed)
Summary: SORE THROAT   $50   STC   Vital Signs:  Patient profile:   76 year old male Height:      75 inches Weight:      191.50 pounds BMI:     24.02 O2 Sat:      97 % Temp:     97.5 degrees F oral Pulse rate:   68 / minute BP sitting:   138 / 70  (left arm) Cuff size:   regular  Vitals Entered By: Hector Brunswick (June 21, 2008 11:39 AM) CC: sore throat x 1 week   CC:  sore throat x 1 week.  History of Present Illness: here with severe ST for 1 wk with fever , mild cough , headache and malaise, but denies sob, doe, orthopnea, pnd or LE edema; no CP, wheezing as well; denies polys or low sugars, good compliance with meds, wt stable, trying to follow the low chol DM diet  Problems Prior to Update: 1)  Bronchitis, Acute  (ICD-466.0) 2)  Peripheral Neuropathy, Feet  (ICD-355.8) 3)  Leg Pain, Bilateral  (ICD-729.5) 4)  Fatigue  (ICD-780.79) 5)  Benign Prostatic Hypertrophy  (ICD-600.00) 6)  Allergic Rhinitis  (ICD-477.9) 7)  Cough  (ICD-786.2) 8)  Diabetes Mellitus, Type II  (ICD-250.00) 9)  Hyperlipidemia  (ICD-272.4) 10)  Hypertension  (ICD-401.9) 11)  Gerd  (ICD-530.81)  Medications Prior to Update: 1)  Omeprazole 20 Mg  Cpdr (Omeprazole) .... 2po Qd 2)  Simvastatin 80 Mg  Tabs (Simvastatin) .Marland Kitchen.. 1po Qd 3)  Lisinopril-Hydrochlorothiazide 20-12.5 Mg  Tabs (Lisinopril-Hydrochlorothiazide) .Marland Kitchen.. 1 By Mouth Qd 4)  Ecotrin Low Strength 81 Mg  Tbec (Aspirin) .Marland Kitchen.. 1 Po Qd 5)  Metformin Hcl 500 Mg  Tabs (Metformin Hcl) .... 2 Tabs By Mouth Bid 6)  Rolaids 334 Mg  Chew (Dihydroxyaluminum Sod Carb) .... As Directed As Needed 7)  Tussionex Pennkinetic Er 8-10 Mg/59m Lqcr (Chlorpheniramine-Hydrocodone) .... 5 Ml By Mouth Two Times A Day As Needed Cough  Current Medications (verified): 1)  Omeprazole 20 Mg  Cpdr (Omeprazole) .... 2po Qd 2)  Simvastatin 80 Mg  Tabs (Simvastatin) ..Marland Kitchen. 1po Qd 3)  Lisinopril-Hydrochlorothiazide 20-12.5 Mg  Tabs (Lisinopril-Hydrochlorothiazide) ..Marland Kitchen. 1 By  Mouth Qd 4)  Ecotrin Low Strength 81 Mg  Tbec (Aspirin) ..Marland Kitchen. 1 Po Qd 5)  Metformin Hcl 500 Mg  Tabs (Metformin Hcl) .... 2 Tabs By Mouth Bid 6)  Rolaids 334 Mg  Chew (Dihydroxyaluminum Sod Carb) .... As Directed As Needed 7)  Tessalon Perles 100 Mg Caps (Benzonatate) ..Marland Kitchen. 1 - 2 By Mouth Three Times A Day As Needed 8)  Zolpidem Tartrate 10 Mg Tabs (Zolpidem Tartrate) ..Marland Kitchen. 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 [place]    (08/25/2007)  Risk Factors:    Alcohol Use: N/A    >  5 drinks/d w/in last 3 months: N/A    Caffeine Use: N/A    Diet: N/A    Exercise: N/A  Risk Factors:    Smoking Status: quit (01/24/2007)    Packs/Day: N/A    Cigars/wk: N/A    Pipe Use/wk: N/A    Cans of tobacco/wk: N/A    Passive Smoke Exposure: N/A  Social History:    Reviewed history from 08/25/2007 and no changes required:       Former Smoker       Alcohol use-no       Married       5 children       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  , midl ill  Head:  normocephalic and atraumatic.   Eyes:  vision grossly intact, pupils equal, and pupils round.   Ears:  bilat tm's red, sinius nontender Nose:  nasal dischargemucosal pallor and mucosal erythema.   Mouth:  pharyngeal  erythema and pharyngeal exudate.   Neck:  supple and no masses.   Lungs:  normal respiratory effort and normal breath sounds.   Heart:  normal rate, regular rhythm, and no gallop.   Extremities:  no edema, no ulcers    Impression & Recommendations:  Problem # 1:  PHARYNGITIS-ACUTE (HYQ-657)  His updated medication list for this problem includes:    Ecotrin Low Strength 81 Mg Tbec (Aspirin) .Marland Kitchen... 1 po qd    Avelox 400 Mg Tabs (Moxifloxacin hcl) .Marland Kitchen... 1 by mouth once daily for 10 days treat as above, f/u any worsening signs or symptoms   Problem # 2:  DIABETES MELLITUS, TYPE II (ICD-250.00)  His updated medication list for this problem includes:    Lisinopril-hydrochlorothiazide 20-12.5 Mg Tabs (Lisinopril-hydrochlorothiazide) .Marland Kitchen... 1 by mouth qd    Ecotrin Low Strength 81 Mg Tbec (Aspirin) .Marland Kitchen... 1 po qd    Metformin Hcl 500 Mg Tabs (Metformin hcl) .Marland Kitchen... 2 tabs by mouth bid  Labs Reviewed: Creat: 0.9 (01/25/2008)    Reviewed HgBA1c results: 6.8 (01/25/2008)  7.2 (07/27/2007) stable overall by hx and exam, ok to continue meds/tx as is , declines labs today  Problem # 3:  INSOMNIA (ICD-780.52)  His updated medication list for this problem includes:    Zolpidem Tartrate 10 Mg Tabs (Zolpidem tartrate) .Marland Kitchen... 1 by mouth at bedtime as needed treat as above, f/u any worsening signs or symptoms   Problem # 4:  HYPERTENSION (ICD-401.9)  His updated medication list for this problem includes:    Lisinopril-hydrochlorothiazide 20-12.5 Mg Tabs (Lisinopril-hydrochlorothiazide) .Marland Kitchen... 1 by mouth qd  BP today: 138/70 Prior BP: 126/62 (06/11/2008)  Labs Reviewed: K+: 4.7 (01/25/2008) Creat: : 0.9 (01/25/2008)   Chol: 127 (01/25/2008)   HDL: 59.3 (01/25/2008)   LDL: 58 (01/25/2008)   TG: 49 (01/25/2008) stable overall by hx and exam, ok to continue meds/tx as is   Complete Medication List: 1)  Omeprazole 20 Mg Cpdr (Omeprazole) .... 2po qd 2)  Simvastatin 80 Mg Tabs (Simvastatin) .Marland Kitchen.. 1po  qd 3)  Lisinopril-hydrochlorothiazide 20-12.5 Mg Tabs (Lisinopril-hydrochlorothiazide) .Marland Kitchen.. 1 by mouth qd 4)  Ecotrin Low Strength 81 Mg Tbec (Aspirin) .Marland Kitchen.. 1 po qd 5)  Metformin Hcl 500 Mg Tabs (Metformin hcl) .... 2 tabs by mouth bid 6)  Rolaids 334 Mg Chew (Dihydroxyaluminum  sod carb) .... As directed as needed 7)  Tessalon Perles 100 Mg Caps (Benzonatate) .Marland Kitchen.. 1 - 2 by mouth three times a day as needed 8)  Zolpidem Tartrate 10 Mg Tabs (Zolpidem tartrate) .Marland Kitchen.. 1 by mouth at bedtime as needed 9)  Avelox 400 Mg Tabs (Moxifloxacin hcl) .Marland Kitchen.. 1 by mouth once daily for 10 days  Patient Instructions: 1)  please take the avelox 1 per day for 10 days 2)  Please take all new medications as prescribed - the tessalon for the cough 3)  you can also use tylenol for pain, and chloraseptic spray for throat pain as well 4)  Continue all medications that you may have been taking previously  5)  we'll see you at your next visit as scheduled in May 2010 Prescriptions: ZOLPIDEM TARTRATE 10 MG TABS (ZOLPIDEM TARTRATE) 1 by mouth at bedtime as needed  #30 x 2   Entered and Authorized by:   [name] MD   Signed by:   [name] MD on 06/21/2008   Method used:   Print then Give to Patient   RxID:   [id number] TESSALON PERLES 100 MG CAPS (BENZONATATE) 1 - 2 by mouth three times a day as needed  #60 x 1   Entered and Authorized by:   [name] MD   Signed by:   [name] MD on 06/21/2008   Method used:   Print then Give to Patient   RxID:   [id number]

## 2010-04-10 NOTE — Progress Notes (Signed)
Summary: cold sxs  Phone Note Call from Patient Call back at Home Phone [phone]   Caller: Patient Call For: Nicholas Borg MD Complaint: Cough/Sore throat Summary of Call: Pt left msg on triage stating having cold sxs requesting md to call something into his pharmacy. Initial call taken by: Tomma Lightning RMA,  January 09, 2010 12:12 PM  Follow-up for Phone Call        Tried to call pt back no ansew LMOM will need ov before md can rx something. Pls call back to schedule appt to see dr. [name] Follow-up by: Tomma Lightning RMA,  January 09, 2010 12:15 PM

## 2010-04-10 NOTE — Miscellaneous (Signed)
Summary: Orders Update  Clinical Lists Changes  Orders: Added new Test order of Abdominal Aorta Duplex (Abd Aorta Duplex) - Signed

## 2010-04-10 NOTE — Assessment & Plan Note (Signed)
Summary: 6 mth fu/jss   Vital Signs:  Patient Profile:   76 Years Old Male Weight:      188 pounds O2 Sat:      94 % O2 treatment:    Room Air Temp:     97.2 degrees F oral Pulse rate:   67 / minute BP sitting:   130 / 70  (left arm) Cuff size:   regular  Vitals Entered By: Sherlean Foot CMA (January 25, 2008 10:23 AM)                 Preventive Care Screening  Last Tetanus Booster:    Date:  01/25/2008    Results:  decined   Last Flu Shot:    Date:  01/18/2008    Results:  declined   PSA:    Date:  12/28/2007    Results:  1.19 per dr wrenn/urology ng/mL   Chief Complaint:  6 mo f/u.  History of Present Illness: overall doing well except for ST and fever with prod cough, greenish sputum, on top of more chronic nasal allergic symtpoms with clearish d/c for several months; also o/w doing well without polys or low sugars, compliant with meds, trying to follow diet and wt remains stable; denies recent CP, sob, orthopnea, pnd or worseninng LE edema    Updated Prior Medication List: OMEPRAZOLE 20 MG  CPDR (OMEPRAZOLE) 2po qd SIMVASTATIN 80 MG  TABS (SIMVASTATIN) 1po qd LISINOPRIL-HYDROCHLOROTHIAZIDE 20-12.5 MG  TABS (LISINOPRIL-HYDROCHLOROTHIAZIDE) 1 by mouth qd ECOTRIN LOW STRENGTH 81 MG  TBEC (ASPIRIN) 1 po qd METFORMIN HCL 500 MG  TABS (METFORMIN HCL) 2 tabs by mouth bid ROLAIDS 334 MG  CHEW (DIHYDROXYALUMINUM SOD CARB) as directed as needed AZITHROMYCIN 250 MG TABS (AZITHROMYCIN) 2po qd for 1 day, then 1po qd for 4days, then stop XYZAL 5 MG TABS (LEVOCETIRIZINE DIHYDROCHLORIDE) 1po once daily as needed allergies  Current Allergies (reviewed today): No known allergies   Past Medical History:    Reviewed history from 01/24/2007 and no changes required:       rhumatic fever as child       PUD       DJD       GERD       Hypertension       Hyperlipidemia       Diabetes mellitus, type II       Allergic rhinitis       Benign prostatic hypertrophy  Past  Surgical History:    Reviewed history from 01/24/2007 and no changes required:       Total knee replacement - left knee 2001       s/p Laser TURP       s/p left knee arthroscopy   Social History:    Reviewed history from 08/25/2007 and no changes required:       Former Smoker       Alcohol use-no       Married       5 children       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  , mild ill  Head:     Normocephalic and atraumatic without obvious abnormalities. No apparent alopecia or balding. Eyes:     No corneal or conjunctival inflammation noted. EOMI. Perrla.  Ears:     bilat tm's red, sinus nontender Nose:  nasal dischargemucosal pallor and mucosal erythema.   Mouth:     pharyngeal erythema and fair dentition.   Neck:     supple and cervical lymphadenopathy.   Lungs:     Normal respiratory effort, chest expands symmetrically. Lungs are clear to auscultation, no crackles or wheezes. Heart:     Normal rate and regular rhythm. S1 and S2 normal without gallop, murmur, click, rub or other extra sounds. Extremities:     no edema, no ulcers     Impression & Recommendations:  Problem # 1:  PHARYNGITIS-ACUTE (BSJ-628)  His updated medication list for this problem includes:    Ecotrin Low Strength 81 Mg Tbec (Aspirin) .Marland Kitchen... 1 po qd    Azithromycin 250 Mg Tabs (Azithromycin) .Marland Kitchen... 2po qd for 1 day, then 1po qd for 4days, then stop treat as above, f/u any worsening signs or symptoms   Problem # 2:  ALLERGIC RHINITIS (ICD-477.9) treat as above, f/u any worsening signs or symptoms  His updated medication list for this problem includes:    Xyzal 5 Mg Tabs (Levocetirizine dihydrochloride) .Marland Kitchen... 1po once daily as needed allergies   Problem # 3:  DIABETES MELLITUS, TYPE II (ICD-250.00)  His updated medication list for this problem includes:     Lisinopril-hydrochlorothiazide 20-12.5 Mg Tabs (Lisinopril-hydrochlorothiazide) .Marland Kitchen... 1 by mouth qd    Ecotrin Low Strength 81 Mg Tbec (Aspirin) .Marland Kitchen... 1 po qd    Metformin Hcl 500 Mg Tabs (Metformin hcl) .Marland Kitchen... 2 tabs by mouth bid  Orders: TLB-A1C / Hgb A1C (Glycohemoglobin) (83036-A1C) TLB-BMP (Basic Metabolic Panel-BMET) (36629-UTMLYYT) TLB-Lipid Panel (80061-LIPID) stable overall by hx and exam, ok to continue meds/tx as is , check labs  Problem # 4:  HYPERTENSION (ICD-401.9)  His updated medication list for this problem includes:    Lisinopril-hydrochlorothiazide 20-12.5 Mg Tabs (Lisinopril-hydrochlorothiazide) .Marland Kitchen... 1 by mouth qd  BP today: 130/70 Prior BP: 140/69 (10/06/2007)  Labs Reviewed: Creat: 0.8 (07/27/2007) Chol: 133 (07/27/2007)   HDL: 51.0 (07/27/2007)   LDL: 66 (07/27/2007)   TG: 81 (07/27/2007) stable overall by hx and exam, ok to continue meds/tx as is   Complete Medication List: 1)  Omeprazole 20 Mg Cpdr (Omeprazole) .... 2po qd 2)  Simvastatin 80 Mg Tabs (Simvastatin) .Marland Kitchen.. 1po qd 3)  Lisinopril-hydrochlorothiazide 20-12.5 Mg Tabs (Lisinopril-hydrochlorothiazide) .Marland Kitchen.. 1 by mouth qd 4)  Ecotrin Low Strength 81 Mg Tbec (Aspirin) .Marland Kitchen.. 1 po qd 5)  Metformin Hcl 500 Mg Tabs (Metformin hcl) .... 2 tabs by mouth bid 6)  Rolaids 334 Mg Chew (Dihydroxyaluminum sod carb) .... As directed as needed 7)  Azithromycin 250 Mg Tabs (Azithromycin) .... 2po qd for 1 day, then 1po qd for 4days, then stop 8)  Xyzal 5 Mg Tabs (Levocetirizine dihydrochloride) .Marland Kitchen.. 1po once daily as needed allergies   Patient Instructions: 1)  Please take all new medications as prescribed - the antibiotic 2)  please try the xyzal 5 mg - 1 per day for possible allergies 3)  you can use the prescription for the xyzal if you find that it helps 4)  Please go to the Lab in the basement for your blood tests today  5)  Continue all medications that you may have been taking previously  6)  Please schedule  a follow-up appointment in 6 months.   Prescriptions: XYZAL 5 MG TABS (LEVOCETIRIZINE DIHYDROCHLORIDE) 1po once daily as needed allergies  #30 x 11   Entered and Authorized by:   [name] MD   Signed by:   Jeneen Rinks  Quin Hoop MD on 01/25/2008   Method used:   Print then Give to Patient   RxID:   [id number] AZITHROMYCIN 250 MG TABS (AZITHROMYCIN) 2po qd for 1 day, then 1po qd for 4days, then stop  #6 x 1   Entered and Authorized by:   [name] MD   Signed by:   [name] MD on 01/25/2008   Method used:   Print then Give to Patient   RxID:   [id number]  ]

## 2010-04-10 NOTE — Letter (Signed)
Summary: [facility]  [facility]   Imported By: Rise Patience 01/07/2010 10:57:40  _____________________________________________________________________  External Attachment:    Type:   Image     Comment:   External Document

## 2010-04-10 NOTE — Letter (Signed)
Summary: [facility] Instructions  [facility]  [place], [address] [address]   Phone: [phone]  Fax: [phone]       Nicholas Patton    1934/04/05    MRN: [id number]        Procedure Day Sudie Grumbling:  Adelfa Koh  11/18/09     Arrival Time:  9:00AM     Procedure Time:  10:00AM     Location of Procedure:                    _X _  [place] (4th Floor)                      [place]   Starting 5 days prior to your procedure 11/13/09 do not eat nuts, seeds, popcorn, corn, beans, peas,  salads, or any raw vegetables.  Do not take any fiber supplements (e.g. Metamucil, Citrucel, and Benefiber).  THE DAY BEFORE YOUR PROCEDURE         DATE: 11/17/09  DAY: SUNDAY  1.  Drink clear liquids the entire day-NO SOLID FOOD  2.  Do not drink anything colored red or purple.  Avoid juices with pulp.  No orange juice.  3.  Drink at least 64 oz. (8 glasses) of fluid/clear liquids during the day to prevent dehydration and help the prep work efficiently.  CLEAR LIQUIDS INCLUDE: Water Jello Ice Popsicles Tea (sugar ok, no milk/cream) Powdered fruit flavored drinks Coffee (sugar ok, no milk/cream) Gatorade Juice: apple, white grape, white cranberry  Lemonade Clear bullion, consomm, broth Carbonated beverages (any kind) Strained chicken noodle soup Hard Candy                             4.  In the morning, mix first dose of MoviPrep solution:    Empty 1 Pouch A and 1 Pouch B into the disposable container    Add lukewarm drinking water to the top line of the container. Mix to dissolve    Refrigerate (mixed solution should be used within 24 hrs)  5.  Begin drinking the prep at 5:00 p.m. The MoviPrep container is divided by 4 marks.   Every 15 minutes drink the solution down to the next mark (approximately 8 oz) until the full liter is complete.   6.  Follow completed prep with 16 oz of clear liquid of your choice (Nothing red  or purple).  Continue to drink clear liquids until bedtime.  7.  Before going to bed, mix second dose of MoviPrep solution:    Empty 1 Pouch A and 1 Pouch B into the disposable container    Add lukewarm drinking water to the top line of the container. Mix to dissolve    Refrigerate  THE DAY OF YOUR PROCEDURE      DATE: 11/18/09  DAY: MONDAY  Beginning at 5:00AM (5 hours before procedure):         1. Every 15 minutes, drink the solution down to the next mark (approx 8 oz) until the full liter is complete.  2. Follow completed prep with 16 oz. of clear liquid of your choice.    3. You may drink clear liquids until 8:00AM (2 HOURS BEFORE PROCEDURE).   MEDICATION INSTRUCTIONS  Unless otherwise instructed, you should take regular prescription medications with a small sip of water   as early as possible the morning of your  procedure.  Diabetic patients - see separate instructions.  Additional medication instructions:   Hold Hyzaar the morning of your procedure.         OTHER INSTRUCTIONS  You will need a responsible adult at least 74 years of age to accompany you and drive you home.   This person must remain in the waiting room during your procedure.  Wear loose fitting clothing that is easily removed.  Leave jewelry and other valuables at home.  However, you may wish to bring a book to read or  an iPod/MP3 player to listen to music as you wait for your procedure to start.  Remove all body piercing jewelry and leave at home.  Total time from sign-in until discharge is approximately 2-3 hours.  You should go home directly after your procedure and rest.  You can resume normal activities the  day after your procedure.  The day of your procedure you should not:   Drive   Make legal decisions   Operate machinery   Drink alcohol   Return to work  You will receive specific instructions about eating, activities and medications before you leave.    The above  instructions have been reviewed and explained to me by   Emerson Monte RN  November 04, 2009 10:25 AM     I fully understand and can verbalize these instructions _____________________________ Date _________

## 2010-04-10 NOTE — Letter (Signed)
Summary: [facility]   Imported By: Phillis Knack 01/04/2009 08:32:39  _____________________________________________________________________  External Attachment:    Type:   Image     Comment:   External Document

## 2010-04-10 NOTE — Letter (Signed)
Summary: Diabetic Instructions  [name]  [place], [place] [address]   Phone: [phone]  Fax: [phone]    Nicholas Patton [birth year] MRN: [id number]   _  x_   ORAL DIABETIC MEDICATION INSTRUCTIONS  The day before your procedure:   Take your diabetic pill as you do normally  The day of your procedure:   Do not take your diabetic pill    We will check your blood sugar levels during the admission process and again in Recovery before discharging you home  ________________________________________________________________________

## 2010-04-10 NOTE — Procedures (Signed)
Summary: Colonoscopy  Patient: Nicholas Patton Note: All result statuses are Final unless otherwise noted.  Tests: (1) Colonoscopy (COL)   COL Colonoscopy           [facility]     [place], [place]  [address]           COLONOSCOPY PROCEDURE REPORT           PATIENT:  [name], [name]  MR#:  [id number]     BIRTHDATE:  [birth year], 75 yrs. old  GENDER:  male     ENDOSCOPIST:  Loralee Pacas. Sharlett Iles, MD, [facility]     REF. BY:     PROCEDURE DATE:  11/18/2009     PROCEDURE:  Colonoscopy with snare polypectomy     ASA CLASS:  Class II     INDICATIONS:  history of pre-cancerous (adenomatous) colon polyps           MEDICATIONS:   Fentanyl 50 mcg IV, Versed 5 mg IV           DESCRIPTION OF PROCEDURE:   After the risks benefits and     alternatives of the procedure were thoroughly explained, informed     consent was obtained.  Digital rectal exam was performed and     revealed no abnormalities.   The LB [id number] endoscope     was introduced through the anus and advanced to the cecum, which     was identified by both the appendix and ileocecal valve, without     limitations.  The quality of the prep was excellent, using     MoviPrep.  The instrument was then slowly withdrawn as the colon     was fully examined.     <<PROCEDUREIMAGES>>           FINDINGS:  ENDOSCOPIC FINDINGS:   A pedunculated polyp was found     in the sigmoid colon. .5 CM. POLYP SNARE EXCISED.HOT. Moderate     diverticulosis was found in the sigmoid to descending colon     segments.  This was otherwise a normal examination of the colon.     Retroflexed views in the rectum revealed internal hemorrhoids.     The scope was then withdrawn from the patient and the procedure     completed.           COMPLICATIONS:  None     ENDOSCOPIC IMPRESSION:     1) Moderate diverticulosis in the sigmoid to descending colon     segments     2) Pedunculated polyp in the sigmoid colon     3) Otherwise  normal examination     4) Internal hemorrhoids     RECOMMENDATIONS:     1) high fiber diet     2) Follow up colonoscopy in 5 years     REPEAT EXAM:  No           ______________________________     Loralee Pacas. Sharlett Iles, MD, [facility]           CC:  [name], MD           n.     Lorrin MaisMarland Kitchen   Loralee Pacas. Lashawnda Hancox at 11/18/2009 10:36 AM           [name], [phone]  Note: An exclamation mark (!) indicates a result that was not dispersed into the flowsheet. Document Creation Date: 11/18/2009 10:37 AM _______________________________________________________________________  (1)  Order result status: Final Collection or observation date-time: 11/18/2009 10:25 Requested date-time:  Receipt date-time:  Reported date-time:  Referring Physician:   Ordering Physician: Verl Blalock [phone]) Specimen Source:  Source: Tawanna Cooler Order Number: [id number] Lab site:   Appended Document: Colonoscopy 5y  f/u  Appended Document: Colonoscopy     Procedures Next Due Date:    Colonoscopy: 11/2014

## 2010-04-10 NOTE — Assessment & Plan Note (Signed)
Summary: 6 MO ROV /[employer] $50   Vital Signs:  Patient profile:   76 year old male Height:      75 inches (190.50 cm) Weight:      192.2 pounds (87.36 kg) O2 Sat:      98 % Temp:     97.4 degrees F (36.33 degrees C) oral Pulse rate:   66 / minute BP sitting:   130 / 68  (left arm) Cuff size:   regular  Vitals Entered By: Tomma Lightning (Jul 25, 2008 9:07 AM) CC: 6 month follow-up Is Patient Diabetic? Yes  Pain Assessment Patient in pain? no        CC:  6 month follow-up.  History of Present Illness: here with worsening cough and sneezing for at lesat 6 to 8 months; getting worse recetnly; ? wt loss per wife and she thinks his "nerves" are not so good now as well ; denies depressive symptoms or suicidal ideation; cough is nonproducitve , is on ACE, usuallyhas a catch in the throat; has some sinus allergy symptoms but not tried any trial meds such as claritin or zyrtec; no obvious reflux symtpoms or dysphagia; no n/v, fever , CP, sob, doe, orthopnea, pnd or LE edema; has occaisonal sense of wheezing  - used ot have the alb inhaler but not for about a year - may have helped at that point; no coughing or throwing up blood; no ST, headache, CP; did have some lower back pain for 3 days last wk now resolved;   Problems Prior to Update: 1)  Special Screening Malig Neoplasms Other Sites  (ICD-V76.49) 2)  Fatigue  (ICD-780.79) 3)  Insomnia  (ICD-780.52) 4)  Bronchitis, Acute  (ICD-466.0) 5)  Peripheral Neuropathy, Feet  (ICD-355.8) 6)  Leg Pain, Bilateral  (ICD-729.5) 7)  Fatigue  (ICD-780.79) 8)  Benign Prostatic Hypertrophy  (ICD-600.00) 9)  Allergic Rhinitis  (ICD-477.9) 10)  Cough  (ICD-786.2) 11)  Diabetes Mellitus, Type II  (ICD-250.00) 12)  Hyperlipidemia  (ICD-272.4) 13)  Hypertension  (ICD-401.9) 14)  Gerd  (ICD-530.81)  Medications Prior to Update: 1)  Omeprazole 20 Mg  Cpdr (Omeprazole) .... 2po Qd 2)  Simvastatin 80 Mg  Tabs (Simvastatin) .Marland Kitchen.. 1po Qd 3)   Lisinopril-Hydrochlorothiazide 20-12.5 Mg  Tabs (Lisinopril-Hydrochlorothiazide) .Marland Kitchen.. 1 By Mouth Qd 4)  Ecotrin Low Strength 81 Mg  Tbec (Aspirin) .Marland Kitchen.. 1 Po Qd 5)  Metformin Hcl 500 Mg  Tabs (Metformin Hcl) .... 2 Tabs By Mouth Bid 6)  Rolaids 334 Mg  Chew (Dihydroxyaluminum Sod Carb) .... As Directed As Needed 7)  Tessalon Perles 100 Mg Caps (Benzonatate) .Marland Kitchen.. 1 - 2 By Mouth Three Times A Day As Needed 8)  Zolpidem Tartrate 10 Mg Tabs (Zolpidem Tartrate) .Marland Kitchen.. 1 By Mouth At Bedtime As Needed 9)  Avelox 400 Mg Tabs (Moxifloxacin Hcl) .Marland Kitchen.. 1 By Mouth Once Daily For 10 Days  Current Medications (verified): 1)  Omeprazole 20 Mg  Cpdr (Omeprazole) .... 2po Once Daily 2)  Simvastatin 80 Mg  Tabs (Simvastatin) .Marland Kitchen.. 1po Once Daily 3)  Hyzaar 100-12.5 Mg Tabs (Losartan Potassium-Hctz) .Marland Kitchen.. 1po Once Daily  (Generic) 4)  Ecotrin Low Strength 81 Mg  Tbec (Aspirin) .Marland Kitchen.. 1 Po Qd 5)  Metformin Hcl 500 Mg  Tabs (Metformin Hcl) .... 2 Tabs By Mouth Two Times A Day 6)  Rolaids 334 Mg  Chew (Dihydroxyaluminum Sod Carb) .... As Directed As Needed 7)  Proair Hfa 108 (90 Base) Mcg/act Aers (Albuterol Sulfate) .... 2 Puffs Four Times Per  Day As Needed 8)  Cetirizine Hcl 10 Mg Tabs (Cetirizine Hcl) .Marland Kitchen.. 1 By Mouth Once Daily As Needed  Allergies (verified): No Known Drug Allergies  Comments:  Nurse/Medical Assistant: The patient's medications and allergies were reviewed with the patient and were updated in the Medication and Allergy Lists. 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   Head:  normocephalic and atraumatic.   Eyes:  vision grossly intact, pupils equal, and pupils round.   Ears:  R ear normal and L ear normal.   Nose:  nasal dischargemucosal pallor and mucosal erythema.   Mouth:  pharyngeal erythema and fair dentition.   Neck:  supple and no masses.   Lungs:  normal respiratory effort and normal breath sounds.   Heart:  normal rate and regular rhythm.   Abdomen:  soft, non-tender, and normal bowel sounds.   Extremities:  no edema, no ulcers    Impression & Recommendations:  Problem # 1:  COUGH (ICD-786.2) ? post nasal gtt related vs ACE vs asthma - treat as below, f/u any worsening signs or symptoms, check cxr   Orders: T-2 View CXR, Same Day (71020.5TC)  Problem # 2:  DIABETES MELLITUS, TYPE II (ICD-250.00)  His updated medication list for this problem includes:    Hyzaar 100-12.5 Mg Tabs (Losartan potassium-hctz) .Marland Kitchen... 1po once daily  (generic)    Ecotrin Low Strength 81 Mg Tbec (Aspirin) .Marland Kitchen... 1 po qd    Metformin Hcl 500 Mg Tabs (Metformin hcl) .Marland Kitchen... 2 tabs by mouth two times a day  Orders: TLB-Lipid Panel (80061-LIPID) TLB-A1C / Hgb A1C (Glycohemoglobin) (83036-A1C) TLB-Microalbumin/Creat Ratio, Urine  (82043-MALB) stable overall by hx and exam, ok to continue meds/tx as is - to check labs  Problem # 3:  HYPERLIPIDEMIA (ICD-272.4)  His updated medication list for  this problem includes:    Simvastatin 80 Mg Tabs (Simvastatin) .Marland Kitchen... 1po once daily  Labs Reviewed: SGOT: 23 (07/27/2007)   SGPT: 16 (07/27/2007)   HDL:59.3 (01/25/2008), 51.0 (07/27/2007)  LDL:58 (01/25/2008), 66 (07/27/2007)  Chol:127 (01/25/2008), 133 (07/27/2007)  Trig:49 (01/25/2008), 81 (07/27/2007) stable overall by hx and exam, ok to continue meds/tx as is  - goal ldl less than 70  Problem # 4:  HYPERTENSION (ICD-401.9)  His updated medication list for this problem includes:    Hyzaar 100-12.5 Mg Tabs (Losartan potassium-hctz) .Marland Kitchen... 1po once daily  (generic)  Orders: EKG w/ Interpretation (93000)  BP today: 130/68 Prior BP: 138/70 (06/21/2008)  Labs Reviewed: K+: 4.7 (01/25/2008) Creat: : 0.9 (01/25/2008)   Chol: 127 (01/25/2008)   HDL: 59.3 (01/25/2008)   LDL: 58 (01/25/2008)   TG: 49 (01/25/2008) stable overall by hx and exam, ok to continue meds/tx as is - goal SBP < 130  Problem # 5:  FATIGUE (ICD-780.79)  Orders: TLB-B12 + Folate Pnl (92119_41740-C14/GYJ) TLB-BMP (Basic Metabolic Panel-BMET) (85631-SHFWYOV) TLB-CBC Platelet - w/Differential (85025-CBCD) TLB-Hepatic/Liver Function Pnl (80076-HEPATIC) TLB-TSH (Thyroid Stimulating Hormone) (84443-TSH) TLB-Sedimentation Rate (ESR) (85652-ESR)  Complete Medication List: 1)  Omeprazole 20 Mg Cpdr (Omeprazole) .... 2po once daily 2)  Simvastatin 80 Mg Tabs (Simvastatin) .Marland Kitchen.. 1po once daily 3)  Hyzaar 100-12.5 Mg Tabs (Losartan potassium-hctz) .Marland Kitchen.. 1po once daily  (generic) 4)  Ecotrin Low Strength 81 Mg Tbec (Aspirin) .Marland Kitchen.. 1 po qd 5)  Metformin Hcl 500 Mg Tabs (Metformin hcl) .... 2 tabs by mouth two times a day 6)  Rolaids 334 Mg Chew (Dihydroxyaluminum sod carb) .... As directed as needed 7)  Proair Hfa 108 (90 Base) Mcg/act Aers (Albuterol sulfate)  .... 2 puffs four times per day as needed 8)  Cetirizine Hcl 10 Mg Tabs (Cetirizine hcl) .Marland Kitchen.. 1 by mouth once daily as needed  Other Orders: TLB-PSA (Prostate Specific Antigen) (84153-PSA) TLB-Udip ONLY (81003-UDIP)  Patient Instructions: 1)  stop the lisiinopril HCT 2)  start the generic for Hyzaar as prescribed 3)  start the proair HFA inhaler as needed 4)  start the generic for zyrtec as needed for allergies 5)  Continue all medications that you may have been taking previously  6)  Please go to the Lab in the basement for your blood tests today 7)  you had the EKG today 8)  Please go to Radiology in the basement level for your X-Ray today 9)  Please schedule a follow-up appointment in 1 month to re-check the  blood pressure and cough Prescriptions: CETIRIZINE HCL 10 MG TABS (CETIRIZINE HCL) 1 by mouth once daily as needed  #30 x 11   Entered and Authorized by:   [name] MD   Signed by:   [name] MD on 07/25/2008   Method used:   Print then Give to Patient   RxID:   [id number] PROAIR HFA 108 (90 BASE) MCG/ACT AERS (ALBUTEROL SULFATE) 2 puffs four times per day as needed  #1 x 11   Entered and Authorized by:   [name] MD   Signed by:   [name] MD on 07/25/2008   Method used:   Print then Give to Patient   RxID:   [id number] OQHUTM 100-12.5 MG TABS (LOSARTAN POTASSIUM-HCTZ) 1po once daily  (generic)  #30 x 11   Entered and Authorized by:   [name] MD   Signed by:   [name] MD on 07/25/2008   Method used:  Print then Give to Patient   RxID:   [id number] OMEPRAZOLE 20 MG  CPDR (OMEPRAZOLE) 2po once daily  #180 x 3   Entered and Authorized by:   [name] MD   Signed by:   [name] MD on 07/25/2008   Method used:   Print then Give to Patient   RxID:   [id number] SIMVASTATIN 80 MG  TABS (SIMVASTATIN) 1po once daily  #90 x 3   Entered and Authorized by:   [name] MD   Signed by:   [name] MD on 07/25/2008    Method used:   Print then Give to Patient   RxID:   [id number] METFORMIN HCL 500 MG  TABS (METFORMIN HCL) 2 tabs by mouth two times a day  #360 x 3   Entered and Authorized by:   [name] MD   Signed by:   [name] MD on 07/25/2008   Method used:   Print then Give to Patient   RxID:   [id number]

## 2010-04-10 NOTE — Assessment & Plan Note (Signed)
Summary: PER DAH-FU--STC   Vital Signs:  Patient profile:   76 year old male Height:      74 inches Weight:      194 pounds BMI:     25.00 O2 Sat:      90 % on Room air Temp:     98 degrees F oral Pulse rate:   77 / minute BP sitting:   150 / 78  (left arm) Cuff size:   regular  Vitals Entered ByShirlean Mylar Ewing (March 22, 2009 9:12 AM)  O2 Flow:  Room air  Preventive Care Screening     declines  pneumonivax   CC: new prescription for new mailorder/RE   CC:  new prescription for new mailorder/RE.  History of Present Illness: drug plan changed to [facility] - needs new rx;  Pt denies CP, sob, doe, wheezing, orthopnea, pnd, worsening LE edema, palps, dizziness or syncope  Pt denies new neuro symptoms such as headache, facial or extremity weakness   Pt denies polydipsia, polyuria, or low sugar symptoms such as shakiness improved with eating.  Overall good compliance with meds, trying to follow low chol, DM diet, wt stable, little excercise however BP at home with upper arm cuff hsa been < 135/78 per pt.  Does his own B12 shots at home.  Problems Prior to Update: 1)  Vitamin B12 Deficiency  (ICD-266.2) 2)  Special Screening Malig Neoplasms Other Sites  (ICD-V76.49) 3)  Fatigue  (ICD-780.79) 4)  Insomnia  (ICD-780.52) 5)  Bronchitis, Acute  (ICD-466.0) 6)  Peripheral Neuropathy, Feet  (ICD-355.8) 7)  Leg Pain, Bilateral  (ICD-729.5) 8)  Fatigue  (ICD-780.79) 9)  Benign Prostatic Hypertrophy  (ICD-600.00) 10)  Allergic Rhinitis  (ICD-477.9) 11)  Cough  (ICD-786.2) 12)  Diabetes Mellitus, Type II  (ICD-250.00) 13)  Hyperlipidemia  (ICD-272.4) 14)  Hypertension  (ICD-401.9) 15)  Gerd  (ICD-530.81)  Medications Prior to Update: 1)  Omeprazole 20 Mg  Cpdr (Omeprazole) .... 2po Once Daily 2)  Simvastatin 80 Mg  Tabs (Simvastatin) .Marland Kitchen.. 1po Once Daily 3)  Hyzaar 100-25 Mg Tabs (Losartan Potassium-Hctz) .Marland Kitchen.. 1 By Mouth Once Daily (Generic) 4)  Ecotrin Low Strength 81 Mg  Tbec  (Aspirin) .Marland Kitchen.. 1 Po Qd 5)  Metformin Hcl 500 Mg  Tabs (Metformin Hcl) .... 2 Tabs By Mouth Two Times A Day 6)  Rolaids 334 Mg  Chew (Dihydroxyaluminum Sod Carb) .... As Directed As Needed 7)  Proair Hfa 108 (90 Base) Mcg/act Aers (Albuterol Sulfate) .... 2 Puffs Four Times Per Day As Needed 8)  Cetirizine Hcl 10 Mg Tabs (Cetirizine Hcl) .Marland Kitchen.. 1 By Mouth Once Daily As Needed 9)  Cyanocobalamin 1000 Mcg/ml Soln (Cyanocobalamin) .Marland Kitchen.. 1 Cc Im Q Month 10)  Januvia 100 Mg Tabs (Sitagliptin Phosphate) .Marland Kitchen.. 1po Once Daily  Current Medications (verified): 1)  Omeprazole 20 Mg  Cpdr (Omeprazole) .... 2po Once Daily 2)  Simvastatin 80 Mg  Tabs (Simvastatin) .Marland Kitchen.. 1po Once Daily 3)  Hyzaar 100-25 Mg Tabs (Losartan Potassium-Hctz) .Marland Kitchen.. 1 By Mouth Once Daily (Generic) 4)  Ecotrin Low Strength 81 Mg  Tbec (Aspirin) .Marland Kitchen.. 1 Po Qd 5)  Metformin Hcl 500 Mg  Tabs (Metformin Hcl) .... 2 Tabs By Mouth Two Times A Day 6)  Proair Hfa 108 (90 Base) Mcg/act Aers (Albuterol Sulfate) .... 2 Puffs Four Times Per Day As Needed 7)  Cetirizine Hcl 10 Mg Tabs (Cetirizine Hcl) .Marland Kitchen.. 1 By Mouth Once Daily As Needed 8)  Cyanocobalamin 1000 Mcg/ml Soln (Cyanocobalamin) .Marland Kitchen.. 1 Cc  Im Q Month 9)  Januvia 100 Mg Tabs (Sitagliptin Phosphate) .Marland Kitchen.. 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   Head:  normocephalic and atraumatic.   Eyes:  vision grossly intact, pupils equal, and pupils round.   Ears:  R ear normal and L ear normal.   Nose:  no external deformity and no nasal discharge.   Mouth:  no gingival abnormalities and pharynx pink and moist.   Neck:  supple and no masses.   Lungs:  normal respiratory effort and normal breath sounds.   Heart:  normal rate and regular rhythm.   Extremities:  no edema, no erythema    Impression & Recommendations:  Problem # 1:  DIABETES MELLITUS, TYPE II (ICD-250.00)  His updated medication list for this problem includes:    Hyzaar 100-25 Mg Tabs (Losartan potassium-hctz) .Marland Kitchen... 1 by mouth once daily (generic)    Ecotrin Low Strength 81 Mg Tbec (Aspirin) .Marland Kitchen... 1 po qd    Metformin Hcl 500 Mg Tabs (Metformin hcl) .Marland Kitchen... 2 tabs by mouth two times a day    Januvia 100 Mg Tabs (Sitagliptin phosphate) .Marland Kitchen... 1po once daily  Labs Reviewed: Creat: 0.9 (09/06/2008)    Reviewed HgBA1c results: 6.8 (09/06/2008)  7.1 (07/25/2008) stable overall by hx and exam, ok to continue meds/tx as is  - Pt to cont DM diet, excercise, wt loss efforts; to check labs today   Orders: TLB-BMP (Basic Metabolic Panel-BMET) (01751-WCHENID) TLB-A1C / Hgb A1C (Glycohemoglobin) (83036-A1C) TLB-Lipid Panel (80061-LIPID)  Problem # 2:  HYPERTENSION (ICD-401.9)  His updated medication list for this problem includes:    Hyzaar 100-25 Mg Tabs (Losartan potassium-hctz) .Marland Kitchen... 1 by mouth once daily (generic)  BP today: 150/78 Prior BP: 140/66 (09/06/2008)  Labs Reviewed: K+: 4.4 (09/06/2008) Creat: : 0.9 (09/06/2008)   Chol: 130 (07/25/2008)   HDL: 52.30 (07/25/2008)   LDL: 68 (07/25/2008)   TG: 48.0 (07/25/2008) stable overall by hx and exam, ok to continue meds/tx as is   Problem # 3:  HYPERLIPIDEMIA (ICD-272.4)  His updated medication list for this problem includes:    Simvastatin 80 Mg Tabs (Simvastatin) .Marland Kitchen... 1po  once daily  Labs Reviewed: SGOT: 21 (07/25/2008)   SGPT: 16 (07/25/2008)   HDL:52.30 (07/25/2008), 59.3 (01/25/2008)  LDL:68 (07/25/2008), 58 (01/25/2008)  Chol:130 (07/25/2008), 127 (01/25/2008)  Trig:48.0 (07/25/2008), 49 (01/25/2008) stable overall by hx and exam, ok to continue meds/tx as is, Pt to continue diet efforts, good med tolerance; to check labs - goal LDL less than 70 -   Complete Medication List: 1)  Omeprazole 20 Mg Cpdr (Omeprazole) .... 2po once daily 2)  Simvastatin 80 Mg Tabs (Simvastatin) .Marland Kitchen.. 1po once daily 3)  Hyzaar 100-25 Mg Tabs (Losartan potassium-hctz) .Marland Kitchen.. 1 by mouth once daily (generic) 4)  Ecotrin Low Strength 81 Mg Tbec (Aspirin) .Marland Kitchen.. 1 po qd 5)  Metformin Hcl 500 Mg Tabs (  Metformin hcl) .... 2 tabs by mouth two times a day 6)  Proair Hfa 108 (90 Base) Mcg/act Aers (Albuterol sulfate) .... 2 puffs four times per day as needed 7)  Cetirizine Hcl 10 Mg Tabs (Cetirizine hcl) .Marland Kitchen.. 1 by mouth once daily as needed 8)  Cyanocobalamin 1000 Mcg/ml Soln (Cyanocobalamin) .Marland Kitchen.. 1 cc im q month 9)  Januvia 100 Mg Tabs (Sitagliptin phosphate) .Marland Kitchen.. 1po once daily  Patient Instructions: 1)  please continue your B12 shots at home as you do 2)  Continue all previous medications as before this visit  3)  Remember to check wth [facility] [place] to see if the metformin and simvastatin are cheaper there 4)  Please schedule a follow-up appointment in 6 months. Prescriptions: JANUVIA 100 MG TABS (SITAGLIPTIN PHOSPHATE) 1po once daily  #90 x 3   Entered and Authorized by:   [name] MD   Signed by:   [name] MD on 03/22/2009   Method used:   Print then Give to Patient   RxID:   [id number] CETIRIZINE HCL 10 MG TABS (CETIRIZINE HCL) 1 by mouth once daily as needed  #90 x 3   Entered and Authorized by:   [name] MD   Signed by:   [name] MD on 03/22/2009   Method used:   Print then Give to Patient   RxID:   [id number] PROAIR HFA 108 (90 BASE) MCG/ACT  AERS (ALBUTEROL SULFATE) 2 puffs four times per day as needed  #3 x 3   Entered and Authorized by:   [name] MD   Signed by:   [name] MD on 03/22/2009   Method used:   Print then Give to Patient   RxID:   [id number] METFORMIN HCL 500 MG  TABS (METFORMIN HCL) 2 tabs by mouth two times a day  #360 x 3   Entered and Authorized by:   [name] MD   Signed by:   [name] MD on 03/22/2009   Method used:   Print then Give to Patient   RxID:   [id number] HYZAAR 100-25 MG TABS (LOSARTAN POTASSIUM-HCTZ) 1 by mouth once daily (GENERIC)  #90 x 3   Entered and Authorized by:   [name] MD   Signed by:   [name] MD on 03/22/2009   Method used:   Print then Give to Patient   RxID:   [id number] SIMVASTATIN 80 MG  TABS (SIMVASTATIN) 1po once daily  #90 x 3   Entered and Authorized by:   [name] MD   Signed by:   [name] MD on 03/22/2009   Method used:   Print then Give to Patient   RxID:   [id number] OMEPRAZOLE 20 MG  CPDR (OMEPRAZOLE) 2po once daily  #180 x 3   Entered and Authorized by:   [name] MD   Signed by:   [name] MD on 03/22/2009   Method used:   Print then Give to Patient   RxID:   [id number]   Appended Document: Orders Update    Clinical Lists Changes  Orders: Added new Test order of T- * Misc. Laboratory test [phone]) - Signed

## 2010-04-10 NOTE — Assessment & Plan Note (Signed)
Summary: COUGH/NWS   Vital Signs:  Patient profile:   76 year old male Height:      75 inches Weight:      194.25 pounds BMI:     24.37 O2 Sat:      98 % on Room air Temp:     97.9 degrees F oral Pulse rate:   92 / minute BP sitting:   140 / 62  (left arm) Cuff size:   regular  Vitals Entered ByShirlean Mylar Ewing (May 23, 2009 4:29 PM)  O2 Flow:  Room air  CC: cough, congestion, headache/RE   CC:  cough, congestion, and headache/RE.  History of Present Illness: here wtih acute onset 2 days mild to mod headache,, with fever, ST, sinus congestions with mild colored drainage, and mild prod cough greeenish sputum ;  Pt denies CP, sob, doe, wheezing, orthopnea, pnd, worsening LE edema, palps, dizziness or syncope Pt denies new neuro symptoms such as headache, facial or extremity weakness Pt denies polydipsia, polyuria, or low sugar symptoms such as shakiness improved with eating.  Overall good compliance with meds, trying to follow low chol, DM diet, wt stable, little excercise however   Problems Prior to Update: 1)  Bronchitis-acute  (ICD-466.0) 2)  Vitamin B12 Deficiency  (ICD-266.2) 3)  Special Screening Malig Neoplasms Other Sites  (ICD-V76.49) 4)  Fatigue  (ICD-780.79) 5)  Insomnia  (ICD-780.52) 6)  Bronchitis, Acute  (ICD-466.0) 7)  Peripheral Neuropathy, Feet  (ICD-355.8) 8)  Leg Pain, Bilateral  (ICD-729.5) 9)  Fatigue  (ICD-780.79) 10)  Benign Prostatic Hypertrophy  (ICD-600.00) 11)  Allergic Rhinitis  (ICD-477.9) 12)  Cough  (ICD-786.2) 13)  Diabetes Mellitus, Type II  (ICD-250.00) 14)  Hyperlipidemia  (ICD-272.4) 15)  Hypertension  (ICD-401.9) 16)  Gerd  (ICD-530.81)  Medications Prior to Update: 1)  Omeprazole 20 Mg  Cpdr (Omeprazole) .... 2po Once Daily 2)  Simvastatin 80 Mg  Tabs (Simvastatin) .Marland Kitchen.. 1po Once Daily 3)  Hyzaar 100-25 Mg Tabs (Losartan Potassium-Hctz) .Marland Kitchen.. 1 By Mouth Once Daily (Generic) 4)  Ecotrin Low Strength 81 Mg  Tbec (Aspirin) .Marland Kitchen.. 1 Po  Qd 5)  Metformin Hcl 500 Mg  Tabs (Metformin Hcl) .... 2 Tabs By Mouth Two Times A Day 6)  Proair Hfa 108 (90 Base) Mcg/act Aers (Albuterol Sulfate) .... 2 Puffs Four Times Per Day As Needed 7)  Cetirizine Hcl 10 Mg Tabs (Cetirizine Hcl) .Marland Kitchen.. 1 By Mouth Once Daily As Needed 8)  Cyanocobalamin 1000 Mcg/ml Soln (Cyanocobalamin) .Marland Kitchen.. 1 Cc Im Q Month 9)  Januvia 100 Mg Tabs (Sitagliptin Phosphate) .Marland Kitchen.. 1po Once Daily  Current Medications (verified): 1)  Omeprazole 20 Mg  Cpdr (Omeprazole) .... 2po Once Daily 2)  Simvastatin 80 Mg  Tabs (Simvastatin) .Marland Kitchen.. 1po Once Daily 3)  Hyzaar 100-25 Mg Tabs (Losartan Potassium-Hctz) .Marland Kitchen.. 1 By Mouth Once Daily (Generic) 4)  Ecotrin Low Strength 81 Mg  Tbec (Aspirin) .Marland Kitchen.. 1 Po Qd 5)  Metformin Hcl 500 Mg  Tabs (Metformin Hcl) .... 2 Tabs By Mouth Two Times A Day 6)  Proair Hfa 108 (90 Base) Mcg/act Aers (Albuterol Sulfate) .... 2 Puffs Four Times Per Day As Needed 7)  Cetirizine Hcl 10 Mg Tabs (Cetirizine Hcl) .Marland Kitchen.. 1 By Mouth Once Daily As Needed 8)  Cyanocobalamin 1000 Mcg/ml Soln (Cyanocobalamin) .Marland Kitchen.. 1 Cc Im Q Month 9)  Januvia 100 Mg Tabs (Sitagliptin Phosphate) .Marland Kitchen.. 1po Once Daily 10)  Azithromycin 250 Mg Tabs (Azithromycin) .... 2po Qd For 1 Day, Then 1po Qd For  4days, Then Stop 11)  Hydrocodone-Homatropine 5-1.5 Mg/35m Syrp (Hydrocodone-Homatropine) ..Marland Kitchen. 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   Head:  normocephalic and atraumatic.   Eyes:  vision grossly intact, pupils equal, and pupils round.   Ears:  bilat tm's red, sinus mild tender Nose:  nasal dischargemucosal pallor and mucosal edema.   Mouth:  pharyngeal erythema and fair dentition.   Neck:  supple and no masses.   Lungs:  normal respiratory effort and normal breath sounds.   Heart:  normal rate and regular rhythm.   Extremities:  no edema, no erythema    Impression & Recommendations:  Problem # 1:  BRONCHITIS-ACUTE (ICD-466.0)  His updated medication list for this problem includes:    Proair Hfa 108 (90 Base) Mcg/act Aers (Albuterol sulfate) ..Marland Kitchen.. 2 puffs four times per day as needed    Azithromycin 250 Mg Tabs (Azithromycin) ..Marland Kitchen.. 2po qd for 1 day, then 1po qd for 4days, then stop    Hydrocodone-homatropine 5-1.5 Mg/525mSyrp (Hydrocodone-homatropine) ...Marland Kitchen. 1 tsp by mouth q 6 hrs as needed cough treat as above, f/u any worsening signs or symptoms   Problem # 2:  DIABETES MELLITUS, TYPE II (ICD-250.00)  His updated medication list for this problem includes:    Hyzaar 100-25 Mg Tabs (Losartan potassium-hctz) ...Marland Kitchen. 1 by mouth once daily (generic)    Ecotrin Low Strength 81 Mg Tbec (Aspirin) ...Marland Kitchen. 1 po qd    Metformin Hcl 500 Mg Tabs (Metformin hcl) ...Marland Kitchen. 2 tabs by mouth two times a day    Januvia 100 Mg Tabs (Sitagliptin phosphate) ...Marland Kitchen. 1po once daily  Labs Reviewed: Creat: 0.97 (03/23/2009)    Reviewed HgBA1c results: 7.0 (03/22/2009)  6.8 (09/06/2008) stable overall by hx and exam, ok to continue meds/tx as is  ; call for onset polys or sugars > 200  Problem # 3:  HYPERTENSION (ICD-401.9)  His updated medication list for this problem includes:    Hyzaar 100-25 Mg Tabs (Losartan potassium-hctz) ...Marland Kitchen. 1 by mouth once daily (generic)  BP today: 140/62 Prior BP: 150/78  (03/22/2009)  Labs Reviewed: K+: 4.5 (03/23/2009) Creat: : 0.97 (03/23/2009)   Chol: 147 (03/23/2009)   HDL: 54 (03/23/2009)   LDL: 72 (03/23/2009)   TG: 106 (03/23/2009) stable overall by hx and exam, ok to continue meds/tx as is   Complete Medication List: 1)  Omeprazole 20 Mg Cpdr (Omeprazole) .... 2po once daily 2)  Simvastatin 80 Mg Tabs (Simvastatin) ...Marland Kitchen 1po once daily 3)  Hyzaar 100-25 Mg Tabs (Losartan potassium-hctz) ...Marland Kitchen 1 by mouth once daily (generic) 4)  Ecotrin Low Strength 81 Mg Tbec (Aspirin) ...Marland Kitchen 1 po qd 5)  Metformin Hcl 500 Mg Tabs (Metformin hcl) .... 2 tabs by mouth two times a day 6)  Proair Hfa 108 (90 Base) Mcg/act Aers (Albuterol sulfate) .... 2 puffs four times per day  as needed 7)  Cetirizine Hcl 10 Mg Tabs (Cetirizine hcl) .Marland Kitchen.. 1 by mouth once daily as needed 8)  Cyanocobalamin 1000 Mcg/ml Soln (Cyanocobalamin) .Marland Kitchen.. 1 cc im q month 9)  Januvia 100 Mg Tabs (Sitagliptin phosphate) .Marland Kitchen.. 1po once daily 10)  Azithromycin 250 Mg Tabs (Azithromycin) .... 2po qd for 1 day, then 1po qd for 4days, then stop 11)  Hydrocodone-homatropine 5-1.5 Mg/51m Syrp (Hydrocodone-homatropine) ..Marland Kitchen. 1 tsp by mouth q 6 hrs as needed cough  Patient Instructions: 1)  Please take all new medications as prescribed 2)  Continue all previous medications as before this visit  3)  Please schedule a follow-up appointment in 3 months with CPX labs and : 4)  HbgA1C prior to visit, ICD-9: 250.02 5)  Urine Microalbumin prior to visit, ICD-9: Prescriptions: HYDROCODONE-HOMATROPINE 5-1.5 MG/5ML SYRP (HYDROCODONE-HOMATROPINE) 1 tsp by mouth q 6 hrs as needed cough  #6 oz x 1   Entered and Authorized by:   JBiagio BorgMD   Signed by:   JBiagio BorgMD on 05/23/2009   Method used:   Print then Give to Patient   RxID:   [id number] 250 MG TABS (AZITHROMYCIN) 2po qd for 1 day, then 1po qd for 4days, then stop  #6 x 1   Entered and Authorized by:   JBiagio BorgMD   Signed by:    JBiagio BorgMD on 05/23/2009   Method used:   Print then Give to Patient   RxID:   [id number]

## 2010-04-24 ENCOUNTER — Encounter: Payer: Self-pay | Admitting: Internal Medicine

## 2010-05-02 ENCOUNTER — Other Ambulatory Visit: Payer: Self-pay

## 2010-05-05 ENCOUNTER — Telehealth: Payer: Self-pay | Admitting: Internal Medicine

## 2010-05-06 NOTE — Letter (Signed)
Summary: [facility]  [facility]   Imported By: Rise Patience 04/30/2010 12:51:45  _____________________________________________________________________  External Attachment:    Type:   Image     Comment:   External Document

## 2010-05-13 ENCOUNTER — Ambulatory Visit (INDEPENDENT_AMBULATORY_CARE_PROVIDER_SITE_OTHER)
Admission: RE | Admit: 2010-05-13 | Discharge: 2010-05-13 | Disposition: A | Discharge status: Home / Self Care | Payer: Medicare Other | Source: Ambulatory Visit | Attending: Internal Medicine | Admitting: Internal Medicine

## 2010-05-13 ENCOUNTER — Encounter: Payer: Self-pay | Admitting: Internal Medicine

## 2010-05-13 ENCOUNTER — Ambulatory Visit (INDEPENDENT_AMBULATORY_CARE_PROVIDER_SITE_OTHER): Payer: Medicare Other | Admitting: Internal Medicine

## 2010-05-13 ENCOUNTER — Other Ambulatory Visit: Payer: Self-pay | Admitting: Internal Medicine

## 2010-05-13 DIAGNOSIS — J209 Acute bronchitis, unspecified: Secondary | ICD-10-CM

## 2010-05-13 DIAGNOSIS — I10 Essential (primary) hypertension: Secondary | ICD-10-CM

## 2010-05-13 DIAGNOSIS — R062 Wheezing: Secondary | ICD-10-CM

## 2010-05-13 DIAGNOSIS — E119 Type 2 diabetes mellitus without complications: Secondary | ICD-10-CM

## 2010-05-15 NOTE — Progress Notes (Signed)
Summary: ABX req?  Phone Note Call from Patient Call back at [facility] Phone [phone]   Caller: Patient Summary of Call: Pt called stating he has ST, congestion and cough. Pt has refill left on last Rx of cough syrup Rxd but is also requesting Rx for ABX to [facility]. Pt states that at last OV he was told by [facility] to call for ABX, he does not need OV. I did advise pt of office policy but pt still requested I request ABX still, please advise. Initial call taken by: Crissie Sickles, [place],  May 05, 2010 9:43 AM  Follow-up for Phone Call        sorry, per office policy, we normally do not prescribe antibx without OV  consider OV Follow-up by: [name] MD,  May 05, 2010 11:01 AM  Additional Follow-up for Phone Call Additional follow up Details #1::        Pt informed Additional Follow-up by: Crissie Sickles, [place],  May 05, 2010 3:17 PM

## 2010-05-20 NOTE — Assessment & Plan Note (Signed)
Summary: COUGH----STC   Vital Signs:  Patient profile:   76 year old male Height:      74 inches Weight:      186.50 pounds BMI:     24.03 O2 Sat:      97 % on Room air Temp:     98.6 degrees F oral Pulse rate:   69 / minute BP sitting:   120 / 60  (left arm) Cuff size:   regular  Vitals Entered By: Shirlean Mylar Ewing CMA Deborra Medina) (May 13, 2010 11:27 AM)  O2 Flow:  Room air CC: Cough and nasal drainage/RE   CC:  Cough and nasal drainage/RE.  History of Present Illness: here wtih 3 days onset midl to mod gradually worsening fever, HA, ST, general weakness and malaise, and now today prod cough, greenihs sputum and mild wheezing  and sob, cleared by inhaler use at home;  Pt denies CP, orthopnea, pnd, worsening LE edema, palps, dizziness or syncope   Pt denies new neuro symptoms such as headache, facial or extremity weakness  Pt denies polydipsia, polyuria, or low sugar symptoms such as shakiness improved with eating.  Overall good compliance with meds, trying to follow low chol, DM diet, wt stable, little excercise however  No recent  wt loss, night sweats, loss of appetite or other constitutional symptoms Overall good compliance with meds, and good tolerability.   Problems Prior to Update: 1)  Bronchitis-acute  (ICD-466.0) 2)  Vitamin D Deficiency  (ICD-268.9) 3)  Wheezing  (ICD-786.07) 4)  Pers Hx Tobacco Use Presenting Hazards Health  (ICD-V15.82) 5)  Preventive Health Care  (ICD-V70.0) 6)  Vitamin B12 Deficiency  (ICD-266.2) 7)  Special Screening Malig Neoplasms Other Sites  (ICD-V76.49) 8)  Fatigue  (ICD-780.79) 9)  Insomnia  (ICD-780.52) 10)  Bronchitis, Acute  (ICD-466.0) 11)  Peripheral Neuropathy, Feet  (ICD-355.8) 12)  Leg Pain, Bilateral  (ICD-729.5) 13)  Fatigue  (ICD-780.79) 14)  Benign Prostatic Hypertrophy  (ICD-600.00) 15)  Allergic Rhinitis  (ICD-477.9) 16)  Cough  (ICD-786.2) 17)  Diabetes Mellitus, Type II  (ICD-250.00) 18)  Hyperlipidemia  (ICD-272.4) 19)   Hypertension  (ICD-401.9) 20)  Gerd  (ICD-530.81)  Medications Prior to Update: 1)  Omeprazole 20 Mg  Cpdr (Omeprazole) .... 2po Once Daily 2)  Lipitor 40 Mg Tabs (Atorvastatin Calcium) .Marland Kitchen.. 1po Once Daily 3)  Hyzaar 100-25 Mg Tabs (Losartan Potassium-Hctz) .Marland Kitchen.. 1 By Mouth Once Daily (Generic) 4)  Ecotrin Low Strength 81 Mg  Tbec (Aspirin) .Marland Kitchen.. 1 Po Qd 5)  Metformin Hcl 500 Mg  Tabs (Metformin Hcl) .... 2 Tabs By Mouth Two Times A Day 6)  Proair Hfa 108 (90 Base) Mcg/act Aers (Albuterol Sulfate) .... 2 Puffs Four Times Per Day As Needed 7)  Cyanocobalamin 1000 Mcg/ml Soln (Cyanocobalamin) .Marland Kitchen.. 1 Cc Im Q Month 8)  Cvs Vitamin D3 1000 Unit Caps (Cholecalciferol) .Marland Kitchen.. 1po Once Daily 9)  Glimepiride 1 Mg Tabs (Glimepiride) .... 1/2 By Mouth Once Daily  Current Medications (verified): 1)  Omeprazole 20 Mg  Cpdr (Omeprazole) .... 2po Once Daily 2)  Lipitor 40 Mg Tabs (Atorvastatin Calcium) .Marland Kitchen.. 1po Once Daily 3)  Hyzaar 100-25 Mg Tabs (Losartan Potassium-Hctz) .Marland Kitchen.. 1 By Mouth Once Daily (Generic) 4)  Ecotrin Low Strength 81 Mg  Tbec (Aspirin) .Marland Kitchen.. 1 Po Qd 5)  Metformin Hcl 500 Mg  Tabs (Metformin Hcl) .... 2 Tabs By Mouth Two Times A Day 6)  Proair Hfa 108 (90 Base) Mcg/act Aers (Albuterol Sulfate) .... 2 Puffs Four Times  Per Day As Needed 7)  Cyanocobalamin 1000 Mcg/ml Soln (Cyanocobalamin) .Marland Kitchen.. 1 Cc Im Q Month 8)  Cvs Vitamin D3 1000 Unit Caps (Cholecalciferol) .Marland Kitchen.. 1po Once Daily 9)  Glimepiride 1 Mg Tabs (Glimepiride) .... 1/2 By Mouth Once Daily 10)  Azithromycin 250 Mg Tabs (Azithromycin) .... 2po Qd For 1 Day, Then 1po Qd For 4days, Then Stop 11)  Tussionex Pennkinetic Er 10-8 Mg/4m Lqcr (Hydrocod Polst-Chlorphen Polst) .... 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 , mild ill  Head:  normocephalic and atraumatic.   Eyes:  vision grossly intact, pupils equal, and pupils round.   Ears:  bilat tm's red, sinus nontende Nose:  nasal dischargemucosal pallor and mucosal edema.   Mouth:  pharyngeal erythema and fair dentition.   Neck:  supple and cervical lymphadenopathy.   Lungs:  normal respiratory effort, R decreased breath sounds, R wheezes, L decreased breath sounds, and L wheezes.   Heart:  normal rate and regular rhythm.   Extremities:  no edema, no erythema    Impression & Recommendations:  Problem # 1:  BRONCHITIS-ACUTE (ICD-466.0)  His updated medication list for this problem includes:    Proair Hfa 108 (90 Base) Mcg/act Aers (Albuterol sulfate) ..Marland Kitchen.. 2 puffs four times per day as needed    Azithromycin 250 Mg Tabs (Azithromycin) ..Marland Kitchen.. 2po qd for 1 day, then 1po qd for 4days, then stop    Tussionex Pennkinetic Er 10-8 Mg/53mLqcr (Hydrocod polst-chlorphen polst) ...Marland Kitchen. Generic - 1 tsp by mouth two times a day as needed cough  Orders: T-2 View CXR, Same Day ([id number]) treat as above, f/u any worsening signs or symptoms , cant r/o pna - for cxr today  Problem # 2:  WHEEZING (ICD-786.07) mild at best ; no frank wheezing on exam, to cont home inhlaer as needed   Problem # 3:  DIABETES MELLITUS, TYPE II (ICD-250.00)  His updated medication list for this problem includes:    Hyzaar 100-25 Mg Tabs (Losartan potassium-hctz) ...Marland Kitchen.  1 by mouth once daily (generic)    Ecotrin Low Strength 81 Mg Tbec (Aspirin) ...Marland Kitchen. 1 po qd    Metformin Hcl 500 Mg Tabs (Metformin hcl) ...Marland Kitchen. 2 tabs by mouth two times a day    Glimepiride 1 Mg Tabs (Glimepiride) ...Marland Kitchen. 1/2 by mouth once daily  Labs Reviewed: Creat: 0.9 (03/20/2010)    Reviewed HgBA1c results: 7.4 (03/20/2010)  7.3 (09/17/2009) stable overall by hx and exam, ok to continue meds/tx as is , pt to call for onset polys or cbg > 200  Problem # 4:  HYPERTENSION (ICD-401.9)  His updated medication list for this problem includes:    Hyzaar 100-25 Mg Tabs (Losartan potassium-hctz) ...Marland Kitchen. 1 by mouth once daily (generic)  BP today: 120/60 Prior BP: 138/68 (03/20/2010)  Labs Reviewed: K+:  4.5 (03/20/2010) Creat: : 0.9 (03/20/2010)   Chol: 185 (03/20/2010)   HDL: 56.00 (03/20/2010)   LDL: 105 (03/20/2010)   TG: 121.0 (03/20/2010) stable overall by hx and exam, ok to continue meds/tx as is   Complete Medication List: 1)  Omeprazole 20 Mg Cpdr (Omeprazole) .... 2po once daily 2)  Lipitor 40 Mg Tabs (Atorvastatin calcium) .Marland Kitchen.. 1po once daily 3)  Hyzaar 100-25 Mg Tabs (Losartan potassium-hctz) .Marland Kitchen.. 1 by mouth once daily (generic) 4)  Ecotrin Low Strength 81 Mg Tbec (Aspirin) .Marland Kitchen.. 1 po qd 5)  Metformin Hcl 500 Mg Tabs (Metformin hcl) .... 2 tabs by mouth two times a day 6)  Proair Hfa 108 (90 Base) Mcg/act Aers (Albuterol sulfate) .... 2 puffs four times per day as needed 7)  Cyanocobalamin 1000 Mcg/ml Soln (Cyanocobalamin) .Marland Kitchen.. 1 cc im q month 8)  Cvs Vitamin D3 1000 Unit Caps (Cholecalciferol) .Marland Kitchen.. 1po once daily 9)  Glimepiride 1 Mg Tabs (Glimepiride) .... 1/2 by mouth once daily 10)  Azithromycin 250 Mg Tabs (Azithromycin) .... 2po qd for 1 day, then 1po qd for 4days, then stop 11)  Tussionex Pennkinetic Er 10-8 Mg/56m Lqcr (Hydrocod polst-chlorphen polst) .... Generic - 1 tsp by mouth two times a day as needed cough  Patient Instructions: 1)  Please take all new medications as  prescribed 2)  Continue all previous medications as before this visit  3)  Please go to Radiology in the basement level for your X-Ray today  4)  [place]  5)  Please schedule a follow-up appointment as needed. Prescriptions: TCathie HoopsER 10-8 MG/5ML LQCR (HYDROCOD POLST-CHLORPHEN POLST) generic - 1 tsp by mouth two times a day as needed cough  #6oz x 1   Entered and Authorized by:   JBiagio BorgMD   Signed by:   JBiagio BorgMD on 05/13/2010   Method used:   Print then Give to Patient   RxID:   [id number] 250 MG TABS (AZITHROMYCIN) 2po qd for 1 day, then 1po qd for 4days, then stop  #6 x 1   Entered and Authorized by:   JBiagio BorgMD   Signed by:   JBiagio BorgMD on 05/13/2010   Method used:   Print then Give to Patient   RxID:   [id number]   Orders Added: 1)  T-2 View CXR, Same Day [71020.5TC] 2)  Est. Patient Level IV [[17127]

## 2010-05-22 LAB — GLUCOSE, CAPILLARY
Glucose-Capillary: 108 mg/dL — ABNORMAL HIGH (ref 70–99)
Glucose-Capillary: 140 mg/dL — ABNORMAL HIGH (ref 70–99)

## 2010-07-24 ENCOUNTER — Other Ambulatory Visit: Payer: Self-pay

## 2010-07-24 MED ORDER — CYANOCOBALAMIN 1000 MCG/ML IJ SOLN: 1000.0000 ug | Freq: Once | INTRAMUSCULAR | Status: AC

## 2010-07-24 NOTE — Telephone Encounter (Signed)
Pt called requesting refill of medication to send to mail order due to cost. Rx printed, signed and place in cabinet for pt pick up per pt req.

## 2010-07-25 NOTE — Op Note (Signed)
[place]  Patient:    Nicholas Patton                        MRN: [id number] Proc. Date: 04/01/99 Adm. Date:  [id number] Attending:  Hortencia Pilar                           Operative Report  PROCEDURE:  Targis transurethral microwave thermotherapy.  PREOPERATIVE DIAGNOSIS:  Benign prostatic hypertrophy with outlet obstruction.  POSTOPERATIVE DIAGNOSIS:  Benign prostatic hypertrophy with outlet obstruction.  SURGEON:  Marshall Cork. Jeffie Pollock, M.D.  ANESTHESIA:  Local.  COMPLICATIONS:  None.  DRAINS:  18-French Foley catheter.  INDICATIONS:  [name] is a 76 year old black male who has BPH with outlet obstruction, and has failed medical therapy.  He has elected to pursue microwave thermotherapy.  His urethral length is 4 cm.  Prostate volume was 36 cc.  He had no evidence of residual lobe on cystoscopy.  DESCRIPTION OF PROCEDURE:  The patient was given oral Percocet and Cipro preoperatively.  He was also given an injection of Toradol.  He was taken to the procedure room, where he was placed in the supine position.  His penis was prepped with Betadine solution.  His urethra was instilled with 10 cc of 2% lidocaine jelly.  A red rubber catheter was placed.  The bladder was drained.  His bladder was then filled with 60 cc of 1% lidocaine solution.  The treatment balloon was  then selected for his prostate length; inserted without difficulty.  The balloon was filled with 10 cc of sterile fluid.  The patient was then turned to the lateral decubitus position.  The ultrasound probe was inserted.  The ultrasound revealed excellent positioning of the balloon at the bladder neck, with the catheter through the prostate.  The ultrasound probe was then removed.  The rectum was instilled with 10 cc of 2% lidocaine jelly. hen the rectal balloon was placed and inflated.  The patient was then turned back supine.  The rectal catheter was  brought beneath the stabilization device.  The treatment catheter was secured to the stabilization device.  Rests were placed under the patients knees.  Ice packs were placed on he perineum and the suprapubic area.  The treatment was then initiated on cruise control, and proceeded without complications.  The patient did have some discomfort requiring lifts and SL, but otherwise tolerated the procedure well.  His maximum power was 53 watts, with a  total energy of 85.1 kj.  His maximum NDS was 37.8.  Maximum RTU was 41.6. Therapy time was 28 minutes 30 seconds, with a five minute cooldown time.  At the completion of the procedure the rectal and urethral catheters were removed; a fresh 18-French Foley catheter was inserted without difficulty, placed to a leg bag.  There were no complications during the procedure.  DISPOSITION:  The patient was discharged home in good condition. DD:  04/01/99 TD:  04/02/99 Job: [id number]

## 2010-07-25 NOTE — Discharge Summary (Signed)
[facility]  Patient:    Nicholas Patton, Nicholas Patton                       MRN: [id number] Adm. Date:  [id number] Disc. Date: [id number] Attending:  Eulas Post, Philips John Dictator:   Julian Reil, P.A.-C.                           Discharge Summary  ADMISSION DIAGNOSES: 1. End-stage osteoarthritic degeneration of left knee. 2. Hypertension. 3. Gastroesophageal reflux disease.  DISCHARGE DIAGNOSES: 1. End-stage osteoarthritic degeneration of left knee. 2. Hypertension. 3. Gastroesophageal reflux disease. 4. Postoperative blood loss anemia. 5. Postoperative ileus with associated nausea and vomiting. 6. Postoperative fever of unknown etiology.  OPERATION PERFORMED:  Left total knee replacement by Dr. Hermenia Bers on September 08, 1999.  CONSULTATIONS:  Dr. Fuller Plan for GI.  HISTORY OF PRESENT ILLNESS:  The patient is a 76 year old male seen by [place] for continuing problems concerning aggressive deterioration of his left knee.  The patient has developed a significant varus deformity of the knee.  He has crepitus with range of motion.  He is extremely uncomfortable with any movement of the left knee.  We have tried conservative measures including anti-inflammatories in addition to Hyalgan injections into the knee without significant improvement.  He did have a left knee arthroscopy several years ago which provided him with some short-term comfort. Unfortunately, he has continued with arthritic degeneration of the knee. X-rays in our office revealed bone-on-bone deformity.  The patient was noting pain with weightbearing, stair climbing, and occasionally pain at night.  We discussed his treatment options with him at length, and it was decided that he would be best served by undergoing a left total knee replacement.  The procedure, risks, benefits, complications, and postoperative course were discussed with the patient at length, and he was in agreement  with this plan.  PREOPERATIVE LABORATORY DATA:  Hemoglobin 13.3 with hematocrit 40.5, white blood cell count 5.2, platelets 260,000.  Coag studies within normal limits. Chemistry profile was entirely normal.  Urinalysis was also normal.  HOSPITAL COURSE:  The patient underwent the above-listed surgery by Dr. Andres Ege.  Postoperatively, he was given Ancef for 48 hours.  He was placed on OxyContin for pain control.  Therapy was started at partial weightbearing. His Coumadin was started on the first day postop.  This was for DVT prophylaxis.  On his first day postop, he was noted to have a maximum temperature of 101.9. Hemoglobin was 10.6.  His dressing was quite snug due to swelling.  This was changed.  This gave him a good bit of relief.  He had some mild lung congestion.  He was started on hand-held nebulizers with albuterol.  The patient had a history of BPH in the past, and he was started on a protocol for his Foley with Urecholine in addition.  On the second day postop, he continued with scattered temperatures.  His physical therapy was progressing fairly.  Chest x-ray at this time revealed some atelectasis in the bases without definite pneumonia.  His hand-held nebulizers and incentive spirometry were continued.  On the following day on July 5, he continued to have elevated temperatures with a maximum temperature of 103.  We obtained a KUB, and this revealed findings consistent with a mild colonic ileus.  His hemoglobin on this day had fallen to 8.1 with hematocrit 24.6.  His hemoglobin was 13.3 on  admission, and he had donated 2 units of autologous blood.  He was, therefore, transfused with these 2 units.  He tolerated this well.  On exam, he was feeling fair overall, complaining of some mild GI upset with nausea and vomiting.  There were no complaints of shortness of breath, chest pain, or calf pain.  Abdomen was distended though nontender.  He had bowel sounds only in the  right lower quadrant which were high pitched.  At this time, we made the patient n.p.o. except for medications, restarted IV fluids, placed him on additional antiemetics, and obtained a BMET.  This revealed a normal potassium at 4.0. His sodium was slightly low at 134 with a low calcium of 8.0 and a glucose of 170.  The remainder of the chemistry profile was within normal limits.  We stopped his oxycodone and obtained a GI consult by Nicoletta Ba, P.A.C. and Dr. Fuller Plan.  They felt it would be okay to place the patient on sips of clear liquids and use antiemetics as needed and do guaiac of his stools.  They also ordered LFTs, amylase, and lipase.  These all came back entirely normal except for a low lipase at less than 5.  On the following day on July 6, he continued to have elevated temperature at 101.2.  Hemoglobin had responded nicely to 9.3 after his 2 units.  He was doing much better overall.  Physical therapy continued to progress nicely, however.  He had no nausea or vomiting on this day.  His appetite was improving.  He had no complaints of shortness of breath or chest pain.  His wound was clean and dry, and his calf was soft.  His Coumadin was stopped because of postoperative blood loss anemia.  Physical therapy was continued. his diet was advanced to full liquids on this day.  his stools came back as heme positive but no evidence for GI bleed.  His Protonix was continued, and we followed his hemoglobin level closely.  On the following day on July 7, his fifth day postop, hemoglobin was 9.1.  He was doing well for an orthopedic standpoint, voiding well.  He had several bowel movements.  His appetite was much improved, and his abdomen was soft with good bowel sounds throughout.  His diet was advanced by Dr. Fuller Plan.  On the following day on July 8, he was afebrile with a hemoglobin of 9.1, hematocrit 27.1 which was increasing.  He was doing much better overall, markedly decreased  GI symptoms with a good appetite.  Physical therapy made excellent progress.  At this time, it was felt that the patient was stable and ready for discharge to home.   DISCHARGE MEDICATIONS: 1. Percocet 1 or 2 p.o. q.4-6h. p.r.n. pain. 2. Robaxin 500 mg 1 p.o. q.8h. p.r.n. 3. Protonix 40 mg 1 p.o. q.d.  DIET:  As tolerated.  ACTIVITY:  He will continue to ambulate with a walker, remaining partial weightbearing.  FOLLOWUP:  He will return to see Dr. Eulas Post in our office in one weeks time. For appointment, he will call [phone].  He will also see Dr. Philip Aspen for followup appointment in two weeks for his heme-positive stools.  The patient may need an EGD after recovery from his total knee.  WOUND CARE:  His dressing will be changed on a daily basis.  Staples will remain in place until the patient is approximately two weeks postop at which time them can be removed with Steri-Strips applied as needed.  CONDITION ON DISCHARGE:  Stable and improving.  FINAL DIAGNOSES: 1. End-stage osteoarthritic degeneration of left knee. 2. Hypertension. 3. Gastroesophageal reflux disease.  DISCHARGE DIAGNOSIS:  End-stage osteoarthritic degeneration of left knee requiring left total knee replacement. DD:  10/02/99 TD:  10/05/99 Job: [id number]

## 2010-07-25 NOTE — Op Note (Signed)
[facility]  Patient:    Nicholas Patton, Nicholas Patton                       MRN: [id number] Adm. Date:  [id number] Attending:  Concha Se                           Operative Report  ASSISTANT:  Laurice Record. Aplington, M.D.  PREOPERATIVE DIAGNOSIS:  Degenerative arthritis, left knee.  POSTOPERATIVE DIAGNOSIS:  Degenerative arthritis, left knee.  OPERATIVE PROCEDURE:  Left total knee replacement arthroplasty using a Scorpio cemented system, posterior cruciate saving, not sacrificing with a size 11 femur and 11 tibia, a 12 mm insert and a 26 recessed patella.  PROCEDURE:  After suitable general anesthesia, the knee was prepped and draped routinely after exsanguination with an upper thigh tourniquet placed at 350 mmHg.  A midline incision was made with a medial parapatellar incision to dislocate the patella laterally.  Osteophytes were removed and 10 mm of bone was taken from the distal femur after aligning with a central femoral rod.  It was felt with the slight flexion contracture that that was all that was necessary.  The femur was then cut appropriately with an anterior and posterior ___ cuts for a size 11.  Tibial spines were removed and the remaining menisci were removed and a central rod was placed down the tibia and a 4 mm cut was made off of the lateral defect, a cut was made there.  A trial was then made which revealed it way too short, an additional 4 mm was taken which again was too tight and another 4 mm was taken, so this would then make a total of 10 mm off the defect.  This then tolerated a 12 mm tibial tray very nicely, very loose, but without hyperextension.  It was felt in order to get some slight hyperextension, we would have to go back and cut 2 more off the femur.  It was elected not to do that, as there was no hyperextension really on the other knee and it was a good excellent fit, otherwise, all around.  The pivotal slot was then cut in the  tibia after marking the correct rotation and the patella was reamed to accept a 26 patella.  The wound was thoroughly irrigated while the cement was mixed and then, it was cemented, tibia first, femur second, then patella third.  While the cement was setting, the cement button was held and the patella was trimmed with a burr to a nice smooth surface.  A trial with the 12 mm revealed it to be excellent as far as medial and lateral stability, nice extension, beautiful flexion and we elected to go ahead and change the tray and put in the 12 mm B-11 tibial tray.  Routine wound closure was then accomplished.  The tourniquet was up two 2 hr and 10 min and was let down while we were closing the subcu.  We finished the subcu closure and the staples in the skin.  The wound was closed over a Hemovac in the subcu and one in the joint.  Patella tracking was excellent and lateral release was not felt to be needed.  At the end of the procedure, ice packs were placed about the knee and a compression dressing and a knee immobilizer over all of that.  Prior to the end of the case, 20 cc of 0.5% plain Marcaine  were injected into the knee joint.  He tolerated the surgery well and goes to recovery in good condition.   DD:  09/08/99 TD:  09/09/99 Job: [id number]

## 2010-09-11 ENCOUNTER — Encounter: Payer: Self-pay | Admitting: Internal Medicine

## 2010-09-11 DIAGNOSIS — R062 Wheezing: Secondary | ICD-10-CM

## 2010-09-14 ENCOUNTER — Encounter: Payer: Self-pay | Admitting: Internal Medicine

## 2010-09-14 DIAGNOSIS — Z0000 Encounter for general adult medical examination without abnormal findings: Secondary | ICD-10-CM | POA: Insufficient documentation

## 2010-09-18 ENCOUNTER — Encounter: Payer: Self-pay | Admitting: Internal Medicine

## 2010-09-18 ENCOUNTER — Ambulatory Visit (INDEPENDENT_AMBULATORY_CARE_PROVIDER_SITE_OTHER): Payer: Medicare Other | Admitting: Internal Medicine

## 2010-09-18 ENCOUNTER — Other Ambulatory Visit (INDEPENDENT_AMBULATORY_CARE_PROVIDER_SITE_OTHER): Payer: Medicare Other

## 2010-09-18 VITALS — BP 126/66 | HR 55 | Temp 97.8°F | Ht 75.0 in | Wt 190.4 lb

## 2010-09-18 DIAGNOSIS — E119 Type 2 diabetes mellitus without complications: Secondary | ICD-10-CM

## 2010-09-18 DIAGNOSIS — R5381 Other malaise: Secondary | ICD-10-CM

## 2010-09-18 DIAGNOSIS — I10 Essential (primary) hypertension: Secondary | ICD-10-CM

## 2010-09-18 DIAGNOSIS — E785 Hyperlipidemia, unspecified: Secondary | ICD-10-CM

## 2010-09-18 DIAGNOSIS — N400 Enlarged prostate without lower urinary tract symptoms: Secondary | ICD-10-CM

## 2010-09-18 DIAGNOSIS — R5383 Other fatigue: Secondary | ICD-10-CM

## 2010-09-18 LAB — LIPID PANEL
Cholesterol: 155 mg/dL (ref 0–200)
HDL: 56.2 mg/dL (ref >39.00)
LDL Cholesterol: 84 mg/dL (ref 0–99)
Total CHOL/HDL Ratio: 3
Triglycerides: 72 mg/dL (ref 0.0–149.0)
VLDL: 14.4 mg/dL (ref 0.0–40.0)

## 2010-09-18 LAB — URINALYSIS, ROUTINE W REFLEX MICROSCOPIC
Bilirubin Urine: NEGATIVE
Hgb urine dipstick: NEGATIVE
Ketones, ur: NEGATIVE
Leukocytes, UA: NEGATIVE
Nitrite: NEGATIVE
Specific Gravity, Urine: 1.02 (ref 1.000–1.030)
Total Protein, Urine: NEGATIVE
Urine Glucose: NEGATIVE
Urobilinogen, UA: 0.2 (ref 0.0–1.0)
pH: 6 (ref 5.0–8.0)

## 2010-09-18 LAB — CBC WITH DIFFERENTIAL/PLATELET
Basophils Absolute: 0 10*3/uL (ref 0.0–0.1)
Basophils Relative: 0.9 % (ref 0.0–3.0)
Eosinophils Absolute: 0.1 10*3/uL (ref 0.0–0.7)
Eosinophils Relative: 3.5 % (ref 0.0–5.0)
HCT: 42.6 % (ref 39.0–52.0)
Hemoglobin: 14.2 g/dL (ref 13.0–17.0)
Lymphocytes Relative: 35.5 % (ref 12.0–46.0)
Lymphs Abs: 1.5 10*3/uL (ref 0.7–4.0)
MCHC: 33.3 g/dL (ref 30.0–36.0)
MCV: 87.1 fl (ref 78.0–100.0)
Monocytes Absolute: 0.4 10*3/uL (ref 0.1–1.0)
Monocytes Relative: 9.7 % (ref 3.0–12.0)
Neutro Abs: 2.2 10*3/uL (ref 1.4–7.7)
Neutrophils Relative %: 50.4 % (ref 43.0–77.0)
Platelets: 204 10*3/uL (ref 150.0–400.0)
RBC: 4.89 Mil/uL (ref 4.22–5.81)
RDW: 15.1 % — ABNORMAL HIGH (ref 11.5–14.6)
WBC: 4.3 10*3/uL — ABNORMAL LOW (ref 4.5–10.5)

## 2010-09-18 LAB — BASIC METABOLIC PANEL
BUN: 22 mg/dL (ref 6–23)
CO2: 29 mEq/L (ref 19–32)
Calcium: 9.7 mg/dL (ref 8.4–10.5)
Chloride: 100 mEq/L (ref 96–112)
Creatinine, Ser: 1.1 mg/dL (ref 0.4–1.5)
GFR: 88.75 mL/min (ref >60.00)
Glucose, Bld: 101 mg/dL — ABNORMAL HIGH (ref 70–99)
Potassium: 4.3 mEq/L (ref 3.5–5.1)
Sodium: 142 mEq/L (ref 135–145)

## 2010-09-18 LAB — HEPATIC FUNCTION PANEL
ALT: 18 U/L (ref 0–53)
AST: 21 U/L (ref 0–37)
Albumin: 4.6 g/dL (ref 3.5–5.2)
Alkaline Phosphatase: 91 U/L (ref 39–117)
Bilirubin, Direct: 0.1 mg/dL (ref 0.0–0.3)
Total Bilirubin: 0.4 mg/dL (ref 0.3–1.2)
Total Protein: 7.3 g/dL (ref 6.0–8.3)

## 2010-09-18 LAB — TSH: TSH: 2.1 u[IU]/mL (ref 0.35–5.50)

## 2010-09-18 LAB — MICROALBUMIN / CREATININE URINE RATIO
Creatinine,U: 151.2 mg/dL
Microalb Creat Ratio: 0.3 mg/g (ref 0.0–30.0)
Microalb, Ur: 0.4 mg/dL (ref 0.0–1.9)

## 2010-09-18 LAB — HEMOGLOBIN A1C: Hgb A1c MFr Bld: 7.4 % — ABNORMAL HIGH (ref 4.6–6.5)

## 2010-09-18 LAB — PSA: PSA: 1.71 ng/mL (ref 0.10–4.00)

## 2010-09-18 NOTE — Assessment & Plan Note (Signed)
stable overall by hx and exam, most recent data reviewed with pt, and pt to continue medical treatment as before  Lab Results  Component Value Date   LDLCALC 105* 03/20/2010

## 2010-09-18 NOTE — Patient Instructions (Signed)
Continue all other medications as before Please go to LAB in the Basement for the blood and/or urine tests to be done today Please call the phone number [phone] (the [employer] [facility]) for results of testing in 2-3 days;  When calling, simply dial the number, and when prompted enter the MRN number above (the Medical Record Number) and the # key, then the message should start. Please return in 6 months, or sooner if needed 

## 2010-09-18 NOTE — Assessment & Plan Note (Signed)
stable overall by hx and exam, most recent data reviewed with pt, and pt to continue medical treatment as before  Lab Results  Component Value Date   HGBA1C 7.4* 03/20/2010

## 2010-09-18 NOTE — Assessment & Plan Note (Signed)
stable overall by hx and exam, most recent data reviewed with pt, and pt to continue medical treatment as before  To check PSA today as he has been released per Dr Jeffie Pollock.urology

## 2010-09-18 NOTE — Assessment & Plan Note (Signed)
stable overall by hx and exam, most recent data reviewed with pt, and pt to continue medical treatment as before  BP Readings from Last 3 Encounters:  09/18/10 126/66  05/13/10 120/60  03/20/10 138/68

## 2010-09-18 NOTE — Progress Notes (Signed)
Subjective:    Patient ID: Nicholas Patton, male    DOB: 02-Oct-1934, 76 y.o.   MRN: [id number]  HPI Here to f/u; overall doing ok,  Pt denies chest pain, increased sob or doe, wheezing, orthopnea, PND, increased LE swelling, palpitations, dizziness or syncope.  Pt denies new neurological symptoms such as new headache, or facial or extremity weakness or numbness   Pt denies polydipsia, polyuria, or low sugar symptoms such as weakness or confusion improved with po intake.  Pt states overall good compliance with meds, trying to follow lower cholesterol, diabetic diet, wt overall stable but little exercise however.  Does have sense of ongoing fatigue, but denies signficant hypersomnolence. Past Medical History  Diagnosis Date  . ALLERGIC RHINITIS 01/24/2007  . BENIGN PROSTATIC HYPERTROPHY 01/24/2007  . BRONCHITIS, ACUTE 06/11/2008  . Cough 01/24/2007  . DIABETES MELLITUS, TYPE II 01/24/2007  . FATIGUE 07/27/2007  . HYPERLIPIDEMIA 01/24/2007  . HYPERTENSION 10/05/2006  . INSOMNIA 06/21/2008  . LEG PAIN, BILATERAL 10/06/2007  . PERIPHERAL NEUROPATHY, FEET 10/06/2007  . PERS HX TOBACCO USE PRESENTING HAZARDS HEALTH 09/17/2009  . VITAMIN B12 DEFICIENCY 09/06/2008  . VITAMIN D DEFICIENCY 01/16/2010  . Wheezing 01/16/2010   Past Surgical History  Procedure Date  . Total knee arthroplasty 2001    Left  . Turp vaporization     reports that he has quit smoking. He does not have any smokeless tobacco history on file. He reports that he does not drink alcohol or use illicit drugs. family history includes Lung cancer in his brother. No Known Allergies Current Outpatient Prescriptions on File Prior to Visit  Medication Sig Dispense Refill  . albuterol (PROAIR HFA) 108 (90 BASE) MCG/ACT inhaler Inhale 2 puffs into the lungs 4 (four) times daily as needed.        Marland Kitchen aspirin 81 MG EC tablet Take 81 mg by mouth daily.        Marland Kitchen atorvastatin (LIPITOR) 40 MG tablet Take 40 mg by mouth daily.        .  Cholecalciferol (VITAMIN D3) 1000 UNITS CAPS Take by mouth daily.        . cyanocobalamin (,VITAMIN B-12,) 1000 MCG/ML injection Inject 1,000 mcg into the muscle every 30 (thirty) days.        Marland Kitchen glimepiride (AMARYL) 1 MG tablet Take 1 mg by mouth daily before breakfast. Only take 1/2 pill daily       . losartan-hydrochlorothiazide (HYZAAR) 100-25 MG per tablet Take 1 tablet by mouth daily.        . metFORMIN (GLUCOPHAGE) 500 MG tablet Take 1,000 mg by mouth 2 (two) times daily with a meal.        . omeprazole (PRILOSEC) 20 MG capsule Take 20 mg by mouth daily.         Review of Systems Review of Systems  Constitutional: Negative for diaphoresis and unexpected weight change.  HENT: Negative for drooling and tinnitus.   Eyes: Negative for photophobia and visual disturbance.  Respiratory: Negative for choking and stridor.   Gastrointestinal: Negative for vomiting and blood in stool.  Genitourinary: Negative for hematuria and decreased urine volume.     Objective:   Physical Exam BP 126/66  Pulse 55  Temp(Src) 97.8 F (36.6 C) (Oral)  Ht _0  (1.905 m)  Wt 190 lb 6.4 oz (86.365 kg)  BMI 23.80 kg/m2  SpO2 96% Physical Exam  VS noted Constitutional: Pt is oriented to person, place, and time. Appears well-developed and well-nourished.  HENT:  Head: Normocephalic and atraumatic.  Right Ear: External ear normal.  Left Ear: External ear normal.  Nose: Nose normal.  Mouth/Throat: Oropharynx is clear and moist.  Eyes: Conjunctivae and EOM are normal. Pupils are equal, round, and reactive to light.  Neck: Normal range of motion. Neck supple. No JVD present. No tracheal deviation present.  Cardiovascular: Normal rate, regular rhythm, normal heart sounds and intact distal pulses.   Pulmonary/Chest: Effort normal and breath sounds normal.  Abdominal: Soft. Bowel sounds are normal. There is no tenderness.  Musculoskeletal: Normal range of motion. Exhibits no edema.  Lymphadenopathy:  Has no  cervical adenopathy.  Neurological: Pt is alert and oriented to person, place, and time. Pt has normal reflexes. No cranial nerve deficit.  Skin: Skin is warm and dry. No rash noted.  Psychiatric:  Has  normal mood and affect. Behavior is normal.  DRE:  deferred       Assessment & Plan:

## 2010-09-18 NOTE — Assessment & Plan Note (Signed)
Etiology unclear, Exam otherwise benign, to check labs as documented, follow with expectant management

## 2010-09-19 ENCOUNTER — Other Ambulatory Visit: Payer: Self-pay | Admitting: Internal Medicine

## 2010-09-19 MED ORDER — GLIMEPIRIDE 1 MG PO TABS: ORAL_TABLET | ORAL | Status: DC

## 2010-09-26 ENCOUNTER — Ambulatory Visit (INDEPENDENT_AMBULATORY_CARE_PROVIDER_SITE_OTHER): Payer: Medicare Other | Admitting: Internal Medicine

## 2010-09-26 ENCOUNTER — Encounter: Payer: Self-pay | Admitting: Internal Medicine

## 2010-09-26 VITALS — BP 140/68 | HR 61 | Temp 98.1°F | Ht 75.0 in | Wt 192.2 lb

## 2010-09-26 DIAGNOSIS — I10 Essential (primary) hypertension: Secondary | ICD-10-CM

## 2010-09-26 DIAGNOSIS — M5412 Radiculopathy, cervical region: Secondary | ICD-10-CM | POA: Insufficient documentation

## 2010-09-26 DIAGNOSIS — E119 Type 2 diabetes mellitus without complications: Secondary | ICD-10-CM

## 2010-09-26 MED ORDER — METHYLPREDNISOLONE 4 MG PO KIT: PACK | ORAL | Status: AC

## 2010-09-26 MED ORDER — TRAMADOL HCL 50 MG PO TABS: 50.0000 mg | ORAL_TABLET | Freq: Four times a day (QID) | ORAL | Status: AC | PRN

## 2010-09-26 NOTE — Patient Instructions (Signed)
Take all new medications as prescribed Continue all other medications as before You will be contacted regarding the referral for: MRI for the neck Depending on the results and how the pain responds to the treatment, you may need to see Neurosurgury Remember, the blood sugars can be mildly elevated with the medrol, but they return to usual after the medrol is over

## 2010-09-26 NOTE — Progress Notes (Signed)
Subjective:    Patient ID: Nicholas Patton, male    DOB: Aug 16, 1934, 76 y.o.   MRN: [id number]  HPI    Pt continues to have new onset left neck pain x 3 days, mild to mod, occas worse with turning the head to the left or extension, with some radiation of pain to the elbow, and numbness to the fingers, not sure about weakness, but no bowel or bladder change, fever, wt loss,  worsening LE pain/numbness/weakness, gait change or falls. No better with ibuprofen otc.   Here to f/u; overall doing ok,  Pt denies chest pain, increased sob or doe,wheezing, orthopnea, PND, increased LE swelling, palpitations, dizziness or syncope.  Pt denies new neurological symptoms such as new headache, or facial or extremity weakness or numbness   Pt denies polydipsia, polyuria.   Past Medical History  Diagnosis Date  . ALLERGIC RHINITIS 01/24/2007  . BENIGN PROSTATIC HYPERTROPHY 01/24/2007  . BRONCHITIS, ACUTE 06/11/2008  . Cough 01/24/2007  . DIABETES MELLITUS, TYPE II 01/24/2007  . FATIGUE 07/27/2007  . HYPERLIPIDEMIA 01/24/2007  . HYPERTENSION 10/05/2006  . INSOMNIA 06/21/2008  . LEG PAIN, BILATERAL 10/06/2007  . PERIPHERAL NEUROPATHY, FEET 10/06/2007  . PERS HX TOBACCO USE PRESENTING HAZARDS HEALTH 09/17/2009  . VITAMIN B12 DEFICIENCY 09/06/2008  . VITAMIN D DEFICIENCY 01/16/2010  . Wheezing 01/16/2010   Past Surgical History  Procedure Date  . Total knee arthroplasty 2001    Left  . Turp vaporization     reports that he has quit smoking. He does not have any smokeless tobacco history on file. He reports that he does not drink alcohol or use illicit drugs. family history includes Lung cancer in his brother. No Known Allergies Current Outpatient Prescriptions on File Prior to Visit  Medication Sig Dispense Refill  . albuterol (PROAIR HFA) 108 (90 BASE) MCG/ACT inhaler Inhale 2 puffs into the lungs 4 (four) times daily as needed.        Marland Kitchen aspirin 81 MG EC tablet Take 81 mg by mouth daily.        Marland Kitchen atorvastatin  (LIPITOR) 40 MG tablet Take 40 mg by mouth daily.        . Cholecalciferol (VITAMIN D3) 1000 UNITS CAPS Take by mouth daily.        . cyanocobalamin (,VITAMIN B-12,) 1000 MCG/ML injection Inject 1,000 mcg into the muscle every 30 (thirty) days.        Marland Kitchen glimepiride (AMARYL) 1 MG tablet Only take 1/2 pill twice per day  90 tablet  3  . losartan-hydrochlorothiazide (HYZAAR) 100-25 MG per tablet Take 1 tablet by mouth daily.        . metFORMIN (GLUCOPHAGE) 500 MG tablet Take 1,000 mg by mouth 2 (two) times daily with a meal.        . omeprazole (PRILOSEC) 20 MG capsule Take 20 mg by mouth daily.         Review of Systems Review of Systems  Constitutional: Negative for diaphoresis and unexpected weight change.  HENT: Negative for drooling and tinnitus.   Eyes: Negative for photophobia and visual disturbance.  Respiratory: Negative for choking and stridor.   Gastrointestinal: Negative for vomiting and blood in stool.  Genitourinary: Negative for hematuria and decreased urine volume.  Musculoskeletal: Negative for gait problem.  Skin: Negative for color change and wound.     Objective:   Physical Exam BP 140/68  Pulse 61  Temp(Src) 98.1 F (36.7 C) (Oral)  Ht _0  (1.905 m)  Wt 192 lb 4 oz (87.204 kg)  BMI 24.03 kg/m2  SpO2 97% Physical Exam  VS noted Constitutional: Pt appears well-developed and well-nourished.  HENT: Head: Normocephalic.  Right Ear: External ear normal.  Left Ear: External ear normal.  Eyes: Conjunctivae and EOM are normal. Pupils are equal, round, and reactive to light.  Neck: Normal range of motion. Neck supple.  Cardiovascular: Normal rate and regular rhythm.   Pulmonary/Chest: Effort normal and breath sounds normal.  Neurological: Pt is alert. No cranial nerve deficit. motor/sens/dtr intact except for 4+/5 distal LUE weakness and decr sens to LT Skin: Skin is warm. No erythema.  Psychiatric: Pt behavior is normal. Thought content normal.          Assessment & Plan:

## 2010-09-27 ENCOUNTER — Encounter: Payer: Self-pay | Admitting: Internal Medicine

## 2010-09-27 NOTE — Assessment & Plan Note (Signed)
stable overall by hx and exam, most recent data reviewed with pt, and pt to continue medical treatment as before  BP Readings from Last 3 Encounters:  09/26/10 140/68  09/18/10 126/66  05/13/10 120/60

## 2010-09-27 NOTE — Assessment & Plan Note (Addendum)
Moderate to severe , for pain med, medrol pack asd, and MRI, likely needs surgicial eval but pt wants to hold off on referral at this time

## 2010-09-27 NOTE — Assessment & Plan Note (Addendum)
stable overall by hx and exam, most recent data reviewed with pt, and pt to continue medical treatment as before, pt to call for onset polys or cbg > 200  Lab Results  Component Value Date   HGBA1C 7.4* 09/18/2010

## 2010-10-01 ENCOUNTER — Telehealth: Payer: Self-pay

## 2010-10-01 ENCOUNTER — Other Ambulatory Visit: Payer: Self-pay | Admitting: Internal Medicine

## 2010-10-01 DIAGNOSIS — M5412 Radiculopathy, cervical region: Secondary | ICD-10-CM

## 2010-10-01 NOTE — Telephone Encounter (Signed)
Order re-done for open MRI

## 2010-10-01 NOTE — Telephone Encounter (Signed)
Not urgent (after hours order will not be processed today) - Will defer to [facility]

## 2010-10-01 NOTE — Telephone Encounter (Signed)
Pt called stating that he needs new order for an open MRI

## 2010-10-02 NOTE — Telephone Encounter (Signed)
Pt advised and will expect a call from [facility] with appt info

## 2010-10-04 ENCOUNTER — Ambulatory Visit
Admission: RE | Admit: 2010-10-04 | Discharge: 2010-10-04 | Disposition: A | Discharge status: Home / Self Care | Payer: Medicare Other | Source: Ambulatory Visit | Attending: Internal Medicine | Admitting: Internal Medicine

## 2010-10-04 DIAGNOSIS — M5412 Radiculopathy, cervical region: Secondary | ICD-10-CM

## 2010-10-06 ENCOUNTER — Other Ambulatory Visit: Payer: Self-pay | Admitting: Internal Medicine

## 2010-10-06 DIAGNOSIS — M5412 Radiculopathy, cervical region: Secondary | ICD-10-CM

## 2010-10-07 ENCOUNTER — Other Ambulatory Visit (HOSPITAL_COMMUNITY): Payer: Medicare Other

## 2011-04-09 ENCOUNTER — Encounter: Payer: Self-pay | Admitting: Internal Medicine

## 2011-04-09 ENCOUNTER — Ambulatory Visit (INDEPENDENT_AMBULATORY_CARE_PROVIDER_SITE_OTHER): Payer: Medicare Other | Admitting: Internal Medicine

## 2011-04-09 ENCOUNTER — Ambulatory Visit: Payer: Medicare Other | Admitting: Internal Medicine

## 2011-04-09 ENCOUNTER — Other Ambulatory Visit (INDEPENDENT_AMBULATORY_CARE_PROVIDER_SITE_OTHER): Payer: Medicare Other

## 2011-04-09 VITALS — BP 120/72 | HR 73 | Temp 97.7°F | Ht 75.0 in | Wt 191.5 lb

## 2011-04-09 DIAGNOSIS — J309 Allergic rhinitis, unspecified: Secondary | ICD-10-CM | POA: Diagnosis not present

## 2011-04-09 DIAGNOSIS — E119 Type 2 diabetes mellitus without complications: Secondary | ICD-10-CM | POA: Diagnosis not present

## 2011-04-09 DIAGNOSIS — I10 Essential (primary) hypertension: Secondary | ICD-10-CM

## 2011-04-09 DIAGNOSIS — E785 Hyperlipidemia, unspecified: Secondary | ICD-10-CM | POA: Diagnosis not present

## 2011-04-09 MED ORDER — LOSARTAN POTASSIUM-HCTZ 100-25 MG PO TABS: 1.0000 | ORAL_TABLET | Freq: Every day | ORAL | Status: DC

## 2011-04-09 MED ORDER — HYDROCODONE-HOMATROPINE 5-1.5 MG/5ML PO SYRP: 5.0000 mL | ORAL_SOLUTION | Freq: Four times a day (QID) | ORAL | Status: AC | PRN

## 2011-04-09 MED ORDER — OMEPRAZOLE 20 MG PO CPDR: 20.0000 mg | DELAYED_RELEASE_CAPSULE | Freq: Every day | ORAL | Status: DC

## 2011-04-09 MED ORDER — METFORMIN HCL 500 MG PO TABS: 1000.0000 mg | ORAL_TABLET | Freq: Two times a day (BID) | ORAL | Status: DC

## 2011-04-09 MED ORDER — PREDNISONE 10 MG PO TABS: 10.0000 mg | ORAL_TABLET | Freq: Every day | ORAL | Status: DC

## 2011-04-09 MED ORDER — GLIMEPIRIDE 1 MG PO TABS: ORAL_TABLET | ORAL | Status: DC

## 2011-04-09 MED ORDER — ATORVASTATIN CALCIUM 40 MG PO TABS: 40.0000 mg | ORAL_TABLET | Freq: Every day | ORAL | Status: DC

## 2011-04-09 NOTE — Assessment & Plan Note (Signed)
stable overall by hx and exam, most recent data reviewed with pt, and pt to continue medical treatment as before  BP Readings from Last 3 Encounters:  04/09/11 120/72  09/26/10 140/68  09/18/10 126/66

## 2011-04-09 NOTE — Assessment & Plan Note (Addendum)
stable overall by hx and exam, most recent data reviewed with pt, and pt to continue medical treatment as before  Lab Results  Component Value Date   LDLCALC 84 09/18/2010

## 2011-04-09 NOTE — Assessment & Plan Note (Signed)
stable overall by hx and exam, most recent data reviewed with pt, and pt to continue medical treatment as before  Lab Results  Component Value Date   HGBA1C 7.4* 09/18/2010

## 2011-04-09 NOTE — Assessment & Plan Note (Signed)
Mild to mod, for predpack  course,  to f/u any worsening symptoms or concerns

## 2011-04-09 NOTE — Patient Instructions (Signed)
Take all new medications as prescribed Continue all other medications as before Please call later for Flonase nasal spray if the congestion and cough return Please go to LAB in the Basement for the blood and/or urine tests to be done today Please call the phone number [phone] (the [place]) for results of testing in 2-3 days;  When calling, simply dial the number, and when prompted enter the MRN number above (the Medical Record Number) and the # key, then the message should start. Please return in 6 months, or sooner if needed

## 2011-04-09 NOTE — Progress Notes (Signed)
Subjective:    Patient ID: Nicholas Patton, male    DOB: Jan 11, 1935, 77 y.o.   MRN: [id number]  HPI  Here to f/u;  Does have several 2-3 mo ongoing seasonal nasal allergy symptoms with marked clear congestion, itch and sneeze, without fever, pain, ST, or wheezing, but with mild cough, worse in the am to first get up.  Pt denies chest pain, increased sob or doe, wheezing, orthopnea, PND, increased LE swelling, palpitations, dizziness or syncope.  Pt denies new neurological symptoms such as new headache, or facial or extremity weakness or numbness   Pt denies polydipsia, polyuria, or low sugar symptoms such as weakness or confusion improved with po intake.  Pt states overall good compliance with meds, trying to follow lower cholesterol, diabetic diet, wt overall stable but little exercise however.    Pt denies fever, wt loss, night sweats, loss of appetite, or other constitutional symptoms  Past Medical History  Diagnosis Date  . ALLERGIC RHINITIS 01/24/2007  . BENIGN PROSTATIC HYPERTROPHY 01/24/2007  . BRONCHITIS, ACUTE 06/11/2008  . Cough 01/24/2007  . DIABETES MELLITUS, TYPE II 01/24/2007  . FATIGUE 07/27/2007  . HYPERLIPIDEMIA 01/24/2007  . HYPERTENSION 10/05/2006  . INSOMNIA 06/21/2008  . LEG PAIN, BILATERAL 10/06/2007  . PERIPHERAL NEUROPATHY, FEET 10/06/2007  . PERS HX TOBACCO USE PRESENTING HAZARDS HEALTH 09/17/2009  . VITAMIN B12 DEFICIENCY 09/06/2008  . VITAMIN D DEFICIENCY 01/16/2010  . Wheezing 01/16/2010   Past Surgical History  Procedure Date  . Total knee arthroplasty 2001    Left  . Turp vaporization     reports that he has quit smoking. He does not have any smokeless tobacco history on file. He reports that he does not drink alcohol or use illicit drugs. family history includes Lung cancer in his brother. No Known Allergies Current Outpatient Prescriptions on File Prior to Visit  Medication Sig Dispense Refill  . albuterol (PROAIR HFA) 108 (90 BASE) MCG/ACT inhaler Inhale 2  puffs into the lungs 4 (four) times daily as needed.        Marland Kitchen aspirin 81 MG EC tablet Take 81 mg by mouth daily.        . Cholecalciferol (VITAMIN D3) 1000 UNITS CAPS Take by mouth daily.        . cyanocobalamin (,VITAMIN B-12,) 1000 MCG/ML injection Inject 1,000 mcg into the muscle every 30 (thirty) days.         Review of Systems Review of Systems  Constitutional: Negative for diaphoresis and unexpected weight change.  HENT: Negative for drooling and tinnitus.   Eyes: Negative for photophobia and visual disturbance.  Respiratory: Negative for choking and stridor.   Gastrointestinal: Negative for vomiting and blood in stool.  Genitourinary: Negative for hematuria and decreased urine volume.       Objective:   Physical Exam BP 120/72  Pulse 73  Temp(Src) 97.7 F (36.5 C) (Oral)  Ht _0  (1.905 m)  Wt 191 lb 8 oz (86.864 kg)  BMI 23.94 kg/m2  SpO2 94% Physical Exam  VS noted, not ill appearing Constitutional: Pt appears well-developed and well-nourished.  HENT: Head: Normocephalic.  Right Ear: External ear normal.  Left Ear: External ear normal.  Bilat tm's mild erythema.  Sinus nontender.  Pharynx mild erythema Eyes: Conjunctivae and EOM are normal. Pupils are equal, round, and reactive to light.  Neck: Normal range of motion. Neck supple.  Cardiovascular: Normal rate and regular rhythm.   Pulmonary/Chest: Effort normal and breath sounds normal.  Neurological: Pt is  alert. No cranial nerve deficit.  Skin: Skin is warm. No erythema.  Psychiatric: Pt behavior is normal. Thought content normal.     Assessment & Plan:

## 2011-04-10 LAB — BASIC METABOLIC PANEL
BUN: 20 mg/dL (ref 6–23)
CO2: 31 mEq/L (ref 19–32)
Calcium: 9.8 mg/dL (ref 8.4–10.5)
Chloride: 104 mEq/L (ref 96–112)
Creatinine, Ser: 1.1 mg/dL (ref 0.4–1.5)
GFR: 87.65 mL/min (ref >60.00)
Glucose, Bld: 92 mg/dL (ref 70–99)
Potassium: 4 mEq/L (ref 3.5–5.1)
Sodium: 141 mEq/L (ref 135–145)

## 2011-04-10 LAB — LIPID PANEL
Cholesterol: 177 mg/dL (ref 0–200)
HDL: 53.7 mg/dL (ref >39.00)
LDL Cholesterol: 94 mg/dL (ref 0–99)
Total CHOL/HDL Ratio: 3
Triglycerides: 145 mg/dL (ref 0.0–149.0)
VLDL: 29 mg/dL (ref 0.0–40.0)

## 2011-04-10 LAB — HEMOGLOBIN A1C: Hgb A1c MFr Bld: 6.7 % — ABNORMAL HIGH (ref 4.6–6.5)

## 2011-04-23 DIAGNOSIS — E119 Type 2 diabetes mellitus without complications: Secondary | ICD-10-CM | POA: Diagnosis not present

## 2011-04-23 DIAGNOSIS — H2510 Age-related nuclear cataract, unspecified eye: Secondary | ICD-10-CM | POA: Diagnosis not present

## 2011-04-23 DIAGNOSIS — H35369 Drusen (degenerative) of macula, unspecified eye: Secondary | ICD-10-CM | POA: Diagnosis not present

## 2011-05-11 ENCOUNTER — Telehealth: Payer: Self-pay

## 2011-05-11 NOTE — Telephone Encounter (Signed)
Pt called c/o productive cough, runny nose, HA, SOB and generalized malaise. Pt denies fever but is requesting an ABX, please advise, He says that cough syrup Rx'd at last visit helps with his cough.

## 2011-05-11 NOTE — Telephone Encounter (Signed)
Please consider OV if feels he might need antibx, o/w consider mucinex otc prn

## 2011-05-12 ENCOUNTER — Ambulatory Visit: Payer: Medicare Other | Admitting: Internal Medicine

## 2011-05-12 NOTE — Telephone Encounter (Signed)
Called the patient informed and he agreed to schedule appointment today 05/12/2011.

## 2011-05-22 ENCOUNTER — Ambulatory Visit (INDEPENDENT_AMBULATORY_CARE_PROVIDER_SITE_OTHER): Payer: Medicare Other | Admitting: Internal Medicine

## 2011-05-22 ENCOUNTER — Encounter: Payer: Self-pay | Admitting: Internal Medicine

## 2011-05-22 VITALS — BP 152/80 | HR 57 | Temp 97.9°F | Ht 75.0 in | Wt 192.5 lb

## 2011-05-22 DIAGNOSIS — J209 Acute bronchitis, unspecified: Secondary | ICD-10-CM

## 2011-05-22 DIAGNOSIS — I10 Essential (primary) hypertension: Secondary | ICD-10-CM

## 2011-05-22 DIAGNOSIS — E119 Type 2 diabetes mellitus without complications: Secondary | ICD-10-CM | POA: Diagnosis not present

## 2011-05-22 MED ORDER — ALBUTEROL SULFATE HFA 108 (90 BASE) MCG/ACT IN AERS: 2.0000 | INHALATION_SPRAY | Freq: Four times a day (QID) | RESPIRATORY_TRACT | Status: DC | PRN

## 2011-05-22 MED ORDER — HYDROCOD POLST-CHLORPHEN POLST 10-8 MG/5ML PO LQCR: 5.0000 mL | Freq: Two times a day (BID) | ORAL | Status: DC | PRN

## 2011-05-22 MED ORDER — AZITHROMYCIN 250 MG PO TABS: ORAL_TABLET | ORAL | Status: AC

## 2011-05-22 NOTE — Patient Instructions (Addendum)
Take all new medications as prescribed - the antibiotic, cough medicine No prednisone needed today, or Chest xray Continue all other medications as before, including the inhaler (also refilled today)

## 2011-05-23 ENCOUNTER — Encounter: Payer: Self-pay | Admitting: Internal Medicine

## 2011-05-23 NOTE — Assessment & Plan Note (Signed)
stable overall by hx and exam - elevated mild today liekely situational, most recent data reviewed with pt, and pt to continue medical treatment as before  BP Readings from Last 3 Encounters:  05/22/11 152/80  04/09/11 120/72  09/26/10 140/68

## 2011-05-23 NOTE — Assessment & Plan Note (Signed)
stable overall by hx and exam, most recent data reviewed with pt, and pt to continue medical treatment as before Lab Results  Component Value Date   HGBA1C 6.7* 04/09/2011   Pt to call for onset polys or cbg > 200 with acute illness

## 2011-05-23 NOTE — Assessment & Plan Note (Signed)
With trace hemoptysis likely related,  Mild to mod, for antibx course,  to f/u any worsening symptoms or concerns

## 2011-05-23 NOTE — Progress Notes (Signed)
Subjective:    Patient ID: Nicholas Patton, male    DOB: 1934/11/02, 77 y.o.   MRN: [id number]  HPI  Here with acute onset mild to mod 1 wk ST, HA, general weakness and malaise, with prod cough greenish sputum with also small blood, but Pt denies chest pain, increased sob or doe, wheezing, orthopnea, PND, increased LE swelling, palpitations, dizziness or syncope. Pt denies new neurological symptoms such as new headache, or facial or extremity weakness or numbness   Pt denies polydipsia, polyuria.  Denies worsening depressive symptoms, suicidal ideation, or panic.  Overall good compliance with treatment, and good medicine tolerability. Past Medical History  Diagnosis Date  . ALLERGIC RHINITIS 01/24/2007  . BENIGN PROSTATIC HYPERTROPHY 01/24/2007  . BRONCHITIS, ACUTE 06/11/2008  . Cough 01/24/2007  . DIABETES MELLITUS, TYPE II 01/24/2007  . FATIGUE 07/27/2007  . HYPERLIPIDEMIA 01/24/2007  . HYPERTENSION 10/05/2006  . INSOMNIA 06/21/2008  . LEG PAIN, BILATERAL 10/06/2007  . PERIPHERAL NEUROPATHY, FEET 10/06/2007  . PERS HX TOBACCO USE PRESENTING HAZARDS HEALTH 09/17/2009  . VITAMIN B12 DEFICIENCY 09/06/2008  . VITAMIN D DEFICIENCY 01/16/2010  . Wheezing 01/16/2010   Past Surgical History  Procedure Date  . Total knee arthroplasty 2001    Left  . Turp vaporization     reports that he has quit smoking. He does not have any smokeless tobacco history on file. He reports that he does not drink alcohol or use illicit drugs. family history includes Lung cancer in his brother. No Known Allergies Current Outpatient Prescriptions on File Prior to Visit  Medication Sig Dispense Refill  . aspirin 81 MG EC tablet Take 81 mg by mouth daily.        Marland Kitchen atorvastatin (LIPITOR) 40 MG tablet Take 1 tablet (40 mg total) by mouth daily.  90 tablet  3  . Cholecalciferol (VITAMIN D3) 1000 UNITS CAPS Take by mouth daily.        . cyanocobalamin (,VITAMIN B-12,) 1000 MCG/ML injection Inject 1,000 mcg into the muscle  every 30 (thirty) days.        Marland Kitchen glimepiride (AMARYL) 1 MG tablet Only take 1/2 pill twice per day  90 tablet  3  . losartan-hydrochlorothiazide (HYZAAR) 100-25 MG per tablet Take 1 tablet by mouth daily.  90 tablet  3  . metFORMIN (GLUCOPHAGE) 500 MG tablet Take 2 tablets (1,000 mg total) by mouth 2 (two) times daily with a meal.  360 tablet  3  . omeprazole (PRILOSEC) 20 MG capsule Take 1 capsule (20 mg total) by mouth daily.  90 capsule  3   Review of Systems Review of Systems  Constitutional: Negative for diaphoresis and unexpected weight change.  HENT: Negative for drooling and tinnitus.   Eyes: Negative for photophobia and visual disturbance.  Respiratory: Negative for choking and stridor.   Gastrointestinal: Negative for vomiting and blood in stool.  Genitourinary: Negative for hematuria and decreased urine volume.     Objective:   Physical Exam BP 152/80  Pulse 57  Temp(Src) 97.9 F (36.6 C) (Oral)  Ht _0  (1.905 m)  Wt 192 lb 8 oz (87.317 kg)  BMI 24.06 kg/m2  SpO2 97% Physical Exam  VS noted, mild ill Constitutional: Pt appears well-developed and well-nourished.  HENT: Head: Normocephalic.  Right Ear: External ear normal.  Left Ear: External ear normal.  Bilat tm's mild erythema.  Sinus nontender.  Pharynx mild erythema Eyes: Conjunctivae and EOM are normal. Pupils are equal, round, and reactive to light.  Neck:  Normal range of motion. Neck supple.  Cardiovascular: Normal rate and regular rhythm.   Pulmonary/Chest: Effort normal and breath sounds normal.  Neurological: Pt is alert. No cranial nerve deficit.  Skin: Skin is warm. No erythema.  Psychiatric: Pt behavior is normal. Thought content normal. 1+ nervous    Assessment & Plan:

## 2011-07-22 ENCOUNTER — Telehealth: Payer: Self-pay

## 2011-07-22 MED ORDER — TRAZODONE HCL 50 MG PO TABS: 25.0000 mg | ORAL_TABLET | Freq: Every evening | ORAL | Status: DC | PRN

## 2011-07-22 NOTE — Telephone Encounter (Signed)
Ok to try trazodone prn- done per emr

## 2011-07-22 NOTE — Telephone Encounter (Signed)
Pt called stating he has been experiencing difficulty sleeping through the night. Please advise, pt is requesting to pick up Rx.

## 2011-07-22 NOTE — Telephone Encounter (Signed)
Called the patient left detailed message requested prescription sent to pharmacy

## 2011-07-30 ENCOUNTER — Other Ambulatory Visit: Payer: Self-pay

## 2011-07-30 NOTE — Telephone Encounter (Signed)
Pt called requesting hardcopy refills of Lipitor and Omeprazole to send to new mail order pharmacy. Pt aware MD out of office until 08/04/2011 and is okay to wait until his return for Rx. Please call pt once ready for pick up.

## 2011-08-04 MED ORDER — ATORVASTATIN CALCIUM 40 MG PO TABS: 40.0000 mg | ORAL_TABLET | Freq: Every day | ORAL | Status: DC

## 2011-08-04 MED ORDER — OMEPRAZOLE 20 MG PO CPDR: 20.0000 mg | DELAYED_RELEASE_CAPSULE | Freq: Every day | ORAL | Status: DC

## 2011-10-06 ENCOUNTER — Encounter: Payer: Self-pay | Admitting: Internal Medicine

## 2011-10-06 ENCOUNTER — Ambulatory Visit (INDEPENDENT_AMBULATORY_CARE_PROVIDER_SITE_OTHER): Payer: Medicare Other | Admitting: Internal Medicine

## 2011-10-06 ENCOUNTER — Other Ambulatory Visit (INDEPENDENT_AMBULATORY_CARE_PROVIDER_SITE_OTHER): Payer: Medicare Other

## 2011-10-06 VITALS — BP 136/68 | HR 60 | Temp 98.4°F | Ht 75.0 in | Wt 190.1 lb

## 2011-10-06 DIAGNOSIS — I10 Essential (primary) hypertension: Secondary | ICD-10-CM | POA: Diagnosis not present

## 2011-10-06 DIAGNOSIS — E785 Hyperlipidemia, unspecified: Secondary | ICD-10-CM

## 2011-10-06 DIAGNOSIS — E119 Type 2 diabetes mellitus without complications: Secondary | ICD-10-CM

## 2011-10-06 DIAGNOSIS — N320 Bladder-neck obstruction: Secondary | ICD-10-CM | POA: Diagnosis not present

## 2011-10-06 DIAGNOSIS — G4700 Insomnia, unspecified: Secondary | ICD-10-CM

## 2011-10-06 DIAGNOSIS — J309 Allergic rhinitis, unspecified: Secondary | ICD-10-CM

## 2011-10-06 LAB — URINALYSIS, ROUTINE W REFLEX MICROSCOPIC
Bilirubin Urine: NEGATIVE
Hgb urine dipstick: NEGATIVE
Ketones, ur: NEGATIVE
Leukocytes, UA: NEGATIVE
Nitrite: NEGATIVE
Specific Gravity, Urine: 1.015 (ref 1.000–1.030)
Total Protein, Urine: NEGATIVE
Urine Glucose: NEGATIVE
Urobilinogen, UA: 0.2 (ref 0.0–1.0)
pH: 6 (ref 5.0–8.0)

## 2011-10-06 LAB — CBC WITH DIFFERENTIAL/PLATELET
Basophils Absolute: 0 10*3/uL (ref 0.0–0.1)
Basophils Relative: 0.8 % (ref 0.0–3.0)
Eosinophils Absolute: 0.3 10*3/uL (ref 0.0–0.7)
Eosinophils Relative: 4.1 % (ref 0.0–5.0)
HCT: 42 % (ref 39.0–52.0)
Hemoglobin: 14 g/dL (ref 13.0–17.0)
Lymphocytes Relative: 37.6 % (ref 12.0–46.0)
Lymphs Abs: 2.4 10*3/uL (ref 0.7–4.0)
MCHC: 33.3 g/dL (ref 30.0–36.0)
MCV: 87.9 fl (ref 78.0–100.0)
Monocytes Absolute: 0.7 10*3/uL (ref 0.1–1.0)
Monocytes Relative: 10.9 % (ref 3.0–12.0)
Neutro Abs: 2.9 10*3/uL (ref 1.4–7.7)
Neutrophils Relative %: 46.6 % (ref 43.0–77.0)
Platelets: 211 10*3/uL (ref 150.0–400.0)
RBC: 4.78 Mil/uL (ref 4.22–5.81)
RDW: 14.5 % (ref 11.5–14.6)
WBC: 6.3 10*3/uL (ref 4.5–10.5)

## 2011-10-06 LAB — HEMOGLOBIN A1C: Hgb A1c MFr Bld: 7.3 % — ABNORMAL HIGH (ref 4.6–6.5)

## 2011-10-06 MED ORDER — ZOLPIDEM TARTRATE ER 6.25 MG PO TBCR: 6.2500 mg | EXTENDED_RELEASE_TABLET | Freq: Every evening | ORAL | Status: DC | PRN

## 2011-10-06 NOTE — Assessment & Plan Note (Addendum)
stable overall by hx and exam, most recent data reviewed with pt, and pt to continue medical treatment as before Lab Results  Component Value Date   HGBA1C 6.7* 04/09/2011   For labs today

## 2011-10-06 NOTE — Assessment & Plan Note (Signed)
Ok for [employer] [facility] [employer] cr prn,,  to f/u any worsening symptoms or concerns

## 2011-10-06 NOTE — Assessment & Plan Note (Signed)
For allegra otc prn

## 2011-10-06 NOTE — Patient Instructions (Addendum)
Take all new medications as prescribed Continue all other medications as before Please have the pharmacy call with any refills you may need. Please go to LAB in the Basement for the blood and/or urine tests to be done today You will be contacted by phone if any changes need to be made immediately.  Otherwise, you will receive a letter about your results with an explanation. Please return in 6 months, or sooner if needed

## 2011-10-06 NOTE — Assessment & Plan Note (Signed)
Asympt, for UA and pSA,  to f/u any worsening symptoms or concerns

## 2011-10-06 NOTE — Assessment & Plan Note (Signed)
stable overall by hx and exam, most recent data reviewed with pt, and pt to continue medical treatment as before BP Readings from Last 3 Encounters:  10/06/11 136/68  05/22/11 152/80  04/09/11 120/72

## 2011-10-06 NOTE — Progress Notes (Signed)
Subjective:    Patient ID: Nicholas Patton, male    DOB: Feb 12, 1935, 77 y.o.   MRN: [id number]  HPI  Here to f/u; overall doing ok,  Pt denies chest pain, increased sob or doe, wheezing, orthopnea, PND, increased LE swelling, palpitations, dizziness or syncope.  Pt denies new neurological symptoms such as new headache, or facial or extremity weakness or numbness   Pt denies polydipsia, polyuria, or low sugar symptoms such as weakness or confusion improved with po intake.  Pt states overall good compliance with meds, trying to follow lower cholesterol, diabetic diet, wt overall stable but little exercise however.  Stil with significant difficulty sleeping , trazodone not working.  Does have several wks ongoing nasal allergy symptoms with clear congestion, itch and sneeze, without fever, pain, ST, cough or wheezing. No worsening prostatism or slowing urinary stream Past Medical History  Diagnosis Date  . ALLERGIC RHINITIS 01/24/2007  . BENIGN PROSTATIC HYPERTROPHY 01/24/2007  . BRONCHITIS, ACUTE 06/11/2008  . Cough 01/24/2007  . DIABETES MELLITUS, TYPE II 01/24/2007  . FATIGUE 07/27/2007  . HYPERLIPIDEMIA 01/24/2007  . HYPERTENSION 10/05/2006  . INSOMNIA 06/21/2008  . LEG PAIN, BILATERAL 10/06/2007  . PERIPHERAL NEUROPATHY, FEET 10/06/2007  . PERS HX TOBACCO USE PRESENTING HAZARDS HEALTH 09/17/2009  . VITAMIN B12 DEFICIENCY 09/06/2008  . VITAMIN D DEFICIENCY 01/16/2010  . Wheezing 01/16/2010   Past Surgical History  Procedure Date  . Total knee arthroplasty 2001    Left  . Turp vaporization     reports that he has quit smoking. He does not have any smokeless tobacco history on file. He reports that he does not drink alcohol or use illicit drugs. family history includes Lung cancer in his brother. No Known Allergies Current Outpatient Prescriptions on File Prior to Visit  Medication Sig Dispense Refill  . albuterol (PROAIR HFA) 108 (90 BASE) MCG/ACT inhaler Inhale 2 puffs into the lungs 4 (four)  times daily as needed.  1 Inhaler  5  . aspirin 81 MG EC tablet Take 81 mg by mouth daily.        Marland Kitchen atorvastatin (LIPITOR) 40 MG tablet Take 1 tablet (40 mg total) by mouth daily.  90 tablet  3  . Cholecalciferol (VITAMIN D3) 1000 UNITS CAPS Take by mouth daily.        . cyanocobalamin (,VITAMIN B-12,) 1000 MCG/ML injection Inject 1,000 mcg into the muscle every 30 (thirty) days.        Marland Kitchen glimepiride (AMARYL) 1 MG tablet Only take 1/2 pill twice per day  90 tablet  3  . losartan-hydrochlorothiazide (HYZAAR) 100-25 MG per tablet Take 1 tablet by mouth daily.  90 tablet  3  . metFORMIN (GLUCOPHAGE) 500 MG tablet Take 2 tablets (1,000 mg total) by mouth 2 (two) times daily with a meal.  360 tablet  3  . omeprazole (PRILOSEC) 20 MG capsule Take 1 capsule (20 mg total) by mouth daily.  90 capsule  3  . chlorpheniramine-HYDROcodone (TUSSIONEX PENNKINETIC ER) 10-8 MG/5ML LQCR Take 5 mLs by mouth every 12 (twelve) hours as needed.  140 mL  1  . traZODone (DESYREL) 50 MG tablet Take 0.5-1 tablets (25-50 mg total) by mouth at bedtime as needed for sleep.  30 tablet  3  . zolpidem (AMBIEN CR) 6.25 MG CR tablet Take 1 tablet (6.25 mg total) by mouth at bedtime as needed for sleep.  30 tablet  5   Review of Systems Review of Systems  Constitutional: Negative for diaphoresis and  unexpected weight change.  HENT: Negative for drooling and tinnitus.   Eyes: Negative for photophobia and visual disturbance.  Respiratory: Negative for choking and stridor.   Gastrointestinal: Negative for vomiting and blood in stool.  Genitourinary: Negative for hematuria and decreased urine volume.  Musculoskeletal: Negative for gait problem.  Skin: Negative for color change and wound.  Neurological: Negative for tremors and numbness.  Psychiatric/Behavioral: Negative for decreased concentration. The patient is not hyperactive.      Objective:   Physical Exam BP 136/68  Pulse 60  Temp 98.4 F (36.9 C) (Oral)  Ht _0   (1.905 m)  Wt 190 lb 2 oz (86.24 kg)  BMI 23.76 kg/m2  SpO2 97% Physical Exam  VS noted Constitutional: Pt appears well-developed and well-nourished.  HENT: Head: Normocephalic.  Right Ear: External ear normal.  Left Ear: External ear normal.  Eyes: Conjunctivae and EOM are normal. Pupils are equal, round, and reactive to light.  Neck: Normal range of motion. Neck supple.  Cardiovascular: Normal rate and regular rhythm.   Pulmonary/Chest: Effort normal and breath sounds normal.  Neurological: Pt is alert. Not confused.  Skin: Skin is warm. No erythema. No edema Psychiatric: Pt behavior is normal. Thought content normal. 1+ nervous    Assessment & Plan:

## 2011-10-06 NOTE — Assessment & Plan Note (Signed)
stable overall by hx and exam, most recent data reviewed with pt, and pt to continue medical treatment as before Lab Results  Component Value Date   LDLCALC 94 04/09/2011

## 2011-10-07 ENCOUNTER — Ambulatory Visit: Payer: Medicare Other | Admitting: Internal Medicine

## 2011-10-07 LAB — HEPATIC FUNCTION PANEL
ALT: 18 U/L (ref 0–53)
AST: 24 U/L (ref 0–37)
Albumin: 4.4 g/dL (ref 3.5–5.2)
Alkaline Phosphatase: 86 U/L (ref 39–117)
Bilirubin, Direct: 0 mg/dL (ref 0.0–0.3)
Total Bilirubin: 0.4 mg/dL (ref 0.3–1.2)
Total Protein: 6.8 g/dL (ref 6.0–8.3)

## 2011-10-07 LAB — MICROALBUMIN / CREATININE URINE RATIO
Creatinine,U: 121.9 mg/dL
Microalb Creat Ratio: 0.7 mg/g (ref 0.0–30.0)
Microalb, Ur: 0.9 mg/dL (ref 0.0–1.9)

## 2011-10-07 LAB — BASIC METABOLIC PANEL
BUN: 18 mg/dL (ref 6–23)
CO2: 32 mEq/L (ref 19–32)
Calcium: 9.9 mg/dL (ref 8.4–10.5)
Chloride: 102 mEq/L (ref 96–112)
Creatinine, Ser: 1.1 mg/dL (ref 0.4–1.5)
GFR: 83.87 mL/min (ref >60.00)
Glucose, Bld: 73 mg/dL (ref 70–99)
Potassium: 4.3 mEq/L (ref 3.5–5.1)
Sodium: 142 mEq/L (ref 135–145)

## 2011-10-07 LAB — PSA: PSA: 2.4 ng/mL (ref 0.10–4.00)

## 2011-10-07 LAB — LIPID PANEL
Cholesterol: 125 mg/dL (ref 0–200)
HDL: 53.2 mg/dL (ref >39.00)
LDL Cholesterol: 45 mg/dL (ref 0–99)
Total CHOL/HDL Ratio: 2
Triglycerides: 136 mg/dL (ref 0.0–149.0)
VLDL: 27.2 mg/dL (ref 0.0–40.0)

## 2011-10-07 LAB — TSH: TSH: 2.84 u[IU]/mL (ref 0.35–5.50)

## 2011-10-11 ENCOUNTER — Encounter: Payer: Self-pay | Admitting: Internal Medicine

## 2011-10-14 ENCOUNTER — Telehealth: Payer: Self-pay

## 2011-10-14 MED ORDER — ZOLPIDEM TARTRATE 5 MG PO TABS: 5.0000 mg | ORAL_TABLET | Freq: Every evening | ORAL | Status: DC | PRN

## 2011-10-14 NOTE — Telephone Encounter (Signed)
Faxed hardcopy to pharmacy.

## 2011-10-14 NOTE — Telephone Encounter (Signed)
Requested PA for Zolpidem ER 6.25 please advise

## 2011-10-14 NOTE — Telephone Encounter (Signed)
Ok to try change to zolpidem 5 qhs prn - Done hardcopy to [employer]

## 2011-10-22 DIAGNOSIS — H40029 Open angle with borderline findings, high risk, unspecified eye: Secondary | ICD-10-CM | POA: Diagnosis not present

## 2011-10-22 DIAGNOSIS — H35369 Drusen (degenerative) of macula, unspecified eye: Secondary | ICD-10-CM | POA: Diagnosis not present

## 2011-10-22 DIAGNOSIS — E119 Type 2 diabetes mellitus without complications: Secondary | ICD-10-CM | POA: Diagnosis not present

## 2011-10-22 DIAGNOSIS — H2510 Age-related nuclear cataract, unspecified eye: Secondary | ICD-10-CM | POA: Diagnosis not present

## 2012-03-07 ENCOUNTER — Ambulatory Visit (INDEPENDENT_AMBULATORY_CARE_PROVIDER_SITE_OTHER): Payer: Medicare Other | Admitting: Family Medicine

## 2012-03-07 ENCOUNTER — Encounter: Payer: Self-pay | Admitting: Family Medicine

## 2012-03-07 VITALS — BP 128/70 | HR 89 | Temp 98.0°F | Wt 194.0 lb

## 2012-03-07 DIAGNOSIS — J069 Acute upper respiratory infection, unspecified: Secondary | ICD-10-CM

## 2012-03-07 NOTE — Progress Notes (Signed)
Chief Complaint  Patient presents with  . Cough    chest heaviness; pt usually gest bronchitis every year     HPI: -started: 2days -symptoms:nasal congestion, scratchy throat, cough, congestion in throat and upper chest -denies: fever, SOB, CP, NVD, tooth pain, strep, flu or mono exposure -has tried: delsum -sick contacts: none known -Hx of: bronchitis with colds per his reports  ROS: See pertinent positives and negatives per HPI.  Past Medical History  Diagnosis Date  . ALLERGIC RHINITIS 01/24/2007  . BENIGN PROSTATIC HYPERTROPHY 01/24/2007  . BRONCHITIS, ACUTE 06/11/2008  . Cough 01/24/2007  . DIABETES MELLITUS, TYPE II 01/24/2007  . FATIGUE 07/27/2007  . HYPERLIPIDEMIA 01/24/2007  . HYPERTENSION 10/05/2006  . INSOMNIA 06/21/2008  . LEG PAIN, BILATERAL 10/06/2007  . PERIPHERAL NEUROPATHY, FEET 10/06/2007  . PERS HX TOBACCO USE PRESENTING HAZARDS HEALTH 09/17/2009  . VITAMIN B12 DEFICIENCY 09/06/2008  . VITAMIN D DEFICIENCY 01/16/2010  . Wheezing 01/16/2010    Family History  Problem Relation Age of Onset  . Lung cancer Brother     History   Social History  . Marital Status: Married    Spouse Name: N/A    Number of Children: N/A  . Years of Education: N/A   Social History Main Topics  . Smoking status: Former Research scientist (life sciences)  . Smokeless tobacco: None  . Alcohol Use: No  . Drug Use: No  . Sexually Active:    Other Topics Concern  . None   Social History Narrative  . None    Current outpatient prescriptions:albuterol (PROAIR HFA) 108 (90 BASE) MCG/ACT inhaler, Inhale 2 puffs into the lungs 4 (four) times daily as needed., Disp: 1 Inhaler, Rfl: 5;  aspirin 81 MG EC tablet, Take 81 mg by mouth daily.  , Disp: , Rfl: ;  atorvastatin (LIPITOR) 40 MG tablet, Take 1 tablet (40 mg total) by mouth daily., Disp: 90 tablet, Rfl: 3 chlorpheniramine-HYDROcodone (TUSSIONEX PENNKINETIC ER) 10-8 MG/5ML LQCR, Take 5 mLs by mouth every 12 (twelve) hours as needed., Disp: 140 mL, Rfl: 1;   Cholecalciferol (VITAMIN D3) 1000 UNITS CAPS, Take by mouth daily.  , Disp: , Rfl: ;  cyanocobalamin (,VITAMIN B-12,) 1000 MCG/ML injection, Inject 1,000 mcg into the muscle every 30 (thirty) days.  , Disp: , Rfl:  glimepiride (AMARYL) 1 MG tablet, Only take 1/2 pill twice per day, Disp: 90 tablet, Rfl: 3;  losartan-hydrochlorothiazide (HYZAAR) 100-25 MG per tablet, Take 1 tablet by mouth daily., Disp: 90 tablet, Rfl: 3;  metFORMIN (GLUCOPHAGE) 500 MG tablet, Take 2 tablets (1,000 mg total) by mouth 2 (two) times daily with a meal., Disp: 360 tablet, Rfl: 3 omeprazole (PRILOSEC) 20 MG capsule, Take 1 capsule (20 mg total) by mouth daily., Disp: 90 capsule, Rfl: 3;  zolpidem (AMBIEN) 5 MG tablet, Take 1 tablet (5 mg total) by mouth at bedtime as needed for sleep., Disp: 30 tablet, Rfl: 5;  traZODone (DESYREL) 50 MG tablet, Take 25-50 mg by mouth at bedtime as needed., Disp: , Rfl:   EXAM:  Filed Vitals:   03/07/12 1405  BP: 128/70  Pulse: 89  Temp: 98 F (36.7 C)    There is no height on file to calculate BMI.  GENERAL: vitals reviewed and listed above, alert, oriented, appears well hydrated and in no acute distress  HEENT: atraumatic, conjunttiva clear, no obvious abnormalities on inspection of external nose and ears, normal appearance of ear canals and TMs, clear nasal congestion, mild post oropharyngeal erythema with PND, no tonsillar edema or  exudate, no sinus TTP  NECK: no obvious masses on inspection  LUNGS: clear to auscultation bilaterally, no wheezes, rales or rhonchi, good air movement  CV: HRRR, no peripheral edema  MS: moves all extremities without noticeable abnormality  PSYCH: pleasant and cooperative, no obvious depression or anxiety  ASSESSMENT AND PLAN:  Discussed the following assessment and plan:  1. Upper respiratory infection    -supportive care -Patient advised to return or notify a doctor immediately if symptoms worsen or persist or new concerns  arise.  Patient Instructions  INSTRUCTIONS FOR UPPER RESPIRATORY INFECTION:  -plenty of rest and fluids  -nasal saline wash 2-3 times daily (use prepackaged nasal saline or bottled/distilled water if making your own)   -can use tylenol or ibuprofen as directed for aches and sorethroat  -in the winter time, using a humidifier at night is helpful (please follow cleaning instructions)  -if you are taking a cough medication - use only as directed, may also try a teaspoon of honey to coat the throat and throat lozenges  -for sore throat, salt water gargles can help  -follow up if you have fevers, facial pain, tooth pain, difficulty breathing or are worsening or not getting better in 5-7 days      Arden Axon R.

## 2012-03-07 NOTE — Patient Instructions (Addendum)
INSTRUCTIONS FOR UPPER RESPIRATORY INFECTION:  -plenty of rest and fluids  -nasal saline wash 2-3 times daily (use prepackaged nasal saline or bottled/distilled water if making your own)   -can use tylenol or ibuprofen as directed for aches and sorethroat  -in the winter time, using a humidifier at night is helpful (please follow cleaning instructions)  -if you are taking a cough medication - use only as directed, may also try a teaspoon of honey to coat the throat and throat lozenges  -for sore throat, salt water gargles can help  -follow up if you have fevers, facial pain, tooth pain, difficulty breathing or are worsening or not getting better in 5-7 days

## 2012-03-14 ENCOUNTER — Telehealth: Payer: Self-pay | Admitting: Internal Medicine

## 2012-03-14 NOTE — Telephone Encounter (Signed)
Patient Information:  Caller Name: Jaquese  Phone: [phone]  Patient: Nicholas Patton, Nicholas Patton  Gender: Male  DOB: 1934/06/19  Age: 78 Years  PCP: Cathlean Cower (Adults only)  Office Follow Up:  Does the office need to follow up with this patient?: No  Instructions For The Office: N/A  RN Note:  Mild, intermittent wheezing; Using Albuterol MDI BID for wheezing. Diagnosed with URI by Maudie Mercury PA at [facility] office off [address] on 03/07/12.  PA advised to use nasal saline, humidifier, cough medication as directed. Fasting Blood Sugar 88. RN advised to > frequency of use of Albuterol MDI to every 4-6 hours for cough with intermittent wheezing.  Symptoms  Reason For Call & Symptoms: Called to ask for treatment for productive yellow cough.  Reviewed Health History In EMR: Yes  Reviewed Medications In EMR: Yes  Reviewed Allergies In EMR: Yes  Reviewed Surgeries / Procedures: Yes  Date of Onset of Symptoms: 03/10/2012  Treatments Tried: Alka-Seltzer Plus  Treatments Tried Worked: No  Guideline(s) Used:  Asthma Attack  Disposition Per Guideline:   See Today or Tomorrow in Office  Reason For Disposition Reached:   Intermittent mild wheezing persists > 5 days  Advice Given:  Quick-Relief Asthma Medicine:   Start your quick-relief medicine (e.g., albuterol, salbutamol) at the first sign of any coughing or shortness of breath (don't wait for wheezing). Use your inhaler (2 puffs each time) or nebulizer every 4 hours. Continue the quick-relief medicine until you have not wheezed or coughed for 48 hours.  The best "cough medicine" for an adult with asthma is always the asthma medicine (Note: Don't use cough suppressants, but cough drops may help a tickly cough).  Drinking Liquids:  Try to drink normal amount of liquids (e.g., water). Being adequately hydrated makes it easier to cough up the sticky lung mucus.  Humidifier:   If the air is dry, use a cool mist humidifier to prevent drying of the upper  airway.  Expected Course:  If treatment is started early, most asthma attacks are quickly brought under control. All wheezing should be gone by 5 days.  Call Back If:  Inhaled asthma medicine (nebulizer or inhaler) is needed more often than every 4 hours  Wheezing has not completely cleared after 5 days  You become worse.  Appointment Scheduled:  03/15/2012 11:15:00 Appointment Scheduled Provider:  Cathlean Cower (Adults only)

## 2012-03-14 NOTE — Telephone Encounter (Signed)
Pt already has an appt on Tues Jan 7 scheduled by call a nurse.

## 2012-03-14 NOTE — Telephone Encounter (Signed)
Ok to offer f/u with regina -

## 2012-03-15 ENCOUNTER — Other Ambulatory Visit (INDEPENDENT_AMBULATORY_CARE_PROVIDER_SITE_OTHER): Payer: Medicare Other

## 2012-03-15 ENCOUNTER — Ambulatory Visit (INDEPENDENT_AMBULATORY_CARE_PROVIDER_SITE_OTHER): Payer: Medicare Other | Admitting: Internal Medicine

## 2012-03-15 ENCOUNTER — Encounter: Payer: Self-pay | Admitting: Internal Medicine

## 2012-03-15 VITALS — BP 142/60 | HR 64 | Temp 97.4°F | Ht 75.0 in | Wt 193.5 lb

## 2012-03-15 DIAGNOSIS — J209 Acute bronchitis, unspecified: Secondary | ICD-10-CM

## 2012-03-15 DIAGNOSIS — E785 Hyperlipidemia, unspecified: Secondary | ICD-10-CM | POA: Diagnosis not present

## 2012-03-15 DIAGNOSIS — E119 Type 2 diabetes mellitus without complications: Secondary | ICD-10-CM

## 2012-03-15 DIAGNOSIS — I10 Essential (primary) hypertension: Secondary | ICD-10-CM | POA: Diagnosis not present

## 2012-03-15 LAB — HEMOGLOBIN A1C: Hgb A1c MFr Bld: 7.1 % — ABNORMAL HIGH (ref 4.6–6.5)

## 2012-03-15 LAB — BASIC METABOLIC PANEL
BUN: 17 mg/dL (ref 6–23)
CO2: 31 mEq/L (ref 19–32)
Calcium: 9.9 mg/dL (ref 8.4–10.5)
Chloride: 103 mEq/L (ref 96–112)
Creatinine, Ser: 1.2 mg/dL (ref 0.4–1.5)
GFR: 78.8 mL/min (ref >60.00)
Glucose, Bld: 107 mg/dL — ABNORMAL HIGH (ref 70–99)
Potassium: 3.9 mEq/L (ref 3.5–5.1)
Sodium: 140 mEq/L (ref 135–145)

## 2012-03-15 LAB — LIPID PANEL
Cholesterol: 127 mg/dL (ref 0–200)
HDL: 52.4 mg/dL (ref >39.00)
LDL Cholesterol: 58 mg/dL (ref 0–99)
Total CHOL/HDL Ratio: 2
Triglycerides: 81 mg/dL (ref 0.0–149.0)
VLDL: 16.2 mg/dL (ref 0.0–40.0)

## 2012-03-15 MED ORDER — OMEPRAZOLE 20 MG PO CPDR: 20.0000 mg | DELAYED_RELEASE_CAPSULE | Freq: Every day | ORAL | Status: DC

## 2012-03-15 MED ORDER — LOSARTAN POTASSIUM-HCTZ 100-25 MG PO TABS: 1.0000 | ORAL_TABLET | Freq: Every day | ORAL | Status: DC

## 2012-03-15 MED ORDER — AZITHROMYCIN 250 MG PO TABS: ORAL_TABLET | ORAL | Status: DC

## 2012-03-15 MED ORDER — ZOLPIDEM TARTRATE 5 MG PO TABS: 5.0000 mg | ORAL_TABLET | Freq: Every evening | ORAL | Status: DC | PRN

## 2012-03-15 MED ORDER — ATORVASTATIN CALCIUM 40 MG PO TABS: 40.0000 mg | ORAL_TABLET | Freq: Every day | ORAL | Status: DC

## 2012-03-15 MED ORDER — METFORMIN HCL 500 MG PO TABS: 1000.0000 mg | ORAL_TABLET | Freq: Two times a day (BID) | ORAL | Status: DC

## 2012-03-15 MED ORDER — ALBUTEROL SULFATE HFA 108 (90 BASE) MCG/ACT IN AERS: 2.0000 | INHALATION_SPRAY | Freq: Four times a day (QID) | RESPIRATORY_TRACT | Status: DC | PRN

## 2012-03-15 MED ORDER — GLIMEPIRIDE 1 MG PO TABS: ORAL_TABLET | ORAL | Status: DC

## 2012-03-15 MED ORDER — TRAZODONE HCL 50 MG PO TABS: 25.0000 mg | ORAL_TABLET | Freq: Every evening | ORAL | Status: DC | PRN

## 2012-03-15 MED ORDER — HYDROCOD POLST-CHLORPHEN POLST 10-8 MG/5ML PO LQCR: 5.0000 mL | Freq: Two times a day (BID) | ORAL | Status: DC | PRN

## 2012-03-15 NOTE — Assessment & Plan Note (Addendum)
ECG reviewed as per emr, stable overall by hx and exam, most recent data reviewed with pt, and pt to continue medical treatment as before BP Readings from Last 3 Encounters:  03/15/12 142/60  03/07/12 128/70  10/06/11 136/68

## 2012-03-15 NOTE — Progress Notes (Signed)
Subjective:    Patient ID: Nicholas Patton, male    DOB: 1934-07-14, 78 y.o.   MRN: [id number]  HPI  Here to f/u, seen with recent URI but now Here with acute onset mild to mod 2-3 days ST, HA, general weakness and malaise, with prod cough greenish sputum, but Pt denies chest pain, increased sob or doe, wheezing, orthopnea, PND, increased LE swelling, palpitations, dizziness or syncope.  Quit smoking > 25 yrs.  Declines all immunizations.  Wife died thanksgiving day 05-13-2011, Denies worsening depressive symptoms, suicidal ideation, or panic. Pt denies new neurological symptoms such as new headache, or facial or extremity weakness or numbness   Pt denies polydipsia, polyuria, Pt states overall good compliance with meds, trying to follow lower cholesterol, diabetic diet, wt overall stable but little exercise however.      Past Medical History  Diagnosis Date  . ALLERGIC RHINITIS 01/24/2007  . BENIGN PROSTATIC HYPERTROPHY 01/24/2007  . BRONCHITIS, ACUTE 06/11/2008  . Cough 01/24/2007  . DIABETES MELLITUS, TYPE II 01/24/2007  . FATIGUE 07/27/2007  . HYPERLIPIDEMIA 01/24/2007  . HYPERTENSION 10/05/2006  . INSOMNIA 06/21/2008  . LEG PAIN, BILATERAL 10/06/2007  . PERIPHERAL NEUROPATHY, FEET 10/06/2007  . PERS HX TOBACCO USE PRESENTING HAZARDS HEALTH 09/17/2009  . VITAMIN B12 DEFICIENCY 09/06/2008  . VITAMIN D DEFICIENCY 01/16/2010  . Wheezing 01/16/2010   Past Surgical History  Procedure Date  . Total knee arthroplasty 13-May-1999    Left  . Turp vaporization     reports that he has quit smoking. He does not have any smokeless tobacco history on file. He reports that he does not drink alcohol or use illicit drugs. family history includes Lung cancer in his brother. No Known Allergies . Current Outpatient Prescriptions on File Prior to Visit  Medication Sig Dispense Refill  . albuterol (PROAIR HFA) 108 (90 BASE) MCG/ACT inhaler Inhale 2 puffs into the lungs 4 (four) times daily as needed.  1 Inhaler  5  .  aspirin 81 MG EC tablet Take 81 mg by mouth daily.        Marland Kitchen atorvastatin (LIPITOR) 40 MG tablet Take 1 tablet (40 mg total) by mouth daily.  90 tablet  3  . Cholecalciferol (VITAMIN D3) 1000 UNITS CAPS Take by mouth daily.        . cyanocobalamin (,VITAMIN B-12,) 1000 MCG/ML injection Inject 1,000 mcg into the muscle every 30 (thirty) days.        Marland Kitchen glimepiride (AMARYL) 1 MG tablet Only take 1/2 pill twice per day  90 tablet  3  . losartan-hydrochlorothiazide (HYZAAR) 100-25 MG per tablet Take 1 tablet by mouth daily.  90 tablet  3  . metFORMIN (GLUCOPHAGE) 500 MG tablet Take 2 tablets (1,000 mg total) by mouth 2 (two) times daily with a meal.  360 tablet  3  . omeprazole (PRILOSEC) 20 MG capsule Take 1 capsule (20 mg total) by mouth daily.  90 capsule  3  . traZODone (DESYREL) 50 MG tablet Take 0.5-1 tablets (25-50 mg total) by mouth at bedtime as needed.  180 tablet  1  . zolpidem (AMBIEN) 5 MG tablet Take 1 tablet (5 mg total) by mouth at bedtime as needed for sleep.  30 tablet  5   Review of Systems  Constitutional: Negative for diaphoresis and unexpected weight change.  HENT: Negative for tinnitus.   Eyes: Negative for photophobia and visual disturbance.  Respiratory: Negative for choking and stridor.   Gastrointestinal: Negative for vomiting and blood in  stool.  Genitourinary: Negative for hematuria and decreased urine volume.  Musculoskeletal: Negative for gait problem.  Skin: Negative for color change and wound.  Neurological: Negative for tremors and numbness.  Psychiatric/Behavioral: Negative for decreased concentration. The patient is not hyperactive.       Objective:   Physical Exam BP 142/60  Pulse 64  Temp 97.4 F (36.3 C) (Oral)  Ht _0  (1.905 m)  Wt 193 lb 8 oz (87.771 kg)  BMI 24.19 kg/m2  SpO2 92% Physical Exam  VS noted, mild ill Constitutional: Pt appears well-developed and well-nourished.  HENT: Head: Normocephalic.  Right Ear: External ear normal.  Left  Ear: External ear normal.  Bilat tm's mild erythema.  Sinus nontender.  Pharynx mild erythema Eyes: Conjunctivae and EOM are normal. Pupils are equal, round, and reactive to light.  Neck: Normal range of motion. Neck supple.  Cardiovascular: Normal rate and regular rhythm.   Pulmonary/Chest: Effort normal and breath sounds normal.  - no rales or wheezing Abd:  Soft, NT, +BS Neurological: Pt is alert. Not confused  Skin: Skin is warm. No erythema.  Psychiatric: Pt behavior is normal. Thought content normal.     Assessment & Plan:

## 2012-03-15 NOTE — Patient Instructions (Addendum)
Take all new medications as prescribed - the antibiotic, and cough medicine All of your refills were done today Your EKG was OK today Please go to LAB in the Basement for the blood and/or urine tests to be done today You will be contacted by phone if any changes need to be made immediately.  Otherwise, you will receive a letter about your results with an explanation, but please check with MyChart first. Thank you for enrolling in [place]. Please follow the instructions below to securely access your online medical record. MyChart allows you to send messages to your doctor, view your test results, renew your prescriptions, schedule appointments, and more. To Log into MyChart, please go to [email or web address], and your Username is: [email or web address]  (pass paulette)  OK to cancel the jan 30 appt  Please return in 6 months, or sooner if needed

## 2012-03-15 NOTE — Assessment & Plan Note (Signed)
stable overall by hx and exam, most recent data reviewed with pt, and pt to continue medical treatment as before Lab Results  Component Value Date   HGBA1C 7.1* 03/15/2012

## 2012-03-15 NOTE — Assessment & Plan Note (Signed)
Mild to mod, for antibx course,  to f/u any worsening symptoms or concerns 

## 2012-03-15 NOTE — Assessment & Plan Note (Signed)
stable overall by hx and exam, most recent data reviewed with pt, and pt to continue medical treatment as before Lab Results  Component Value Date   LDLCALC 58 03/15/2012

## 2012-03-29 ENCOUNTER — Telehealth: Payer: Self-pay | Admitting: Internal Medicine

## 2012-03-29 NOTE — Telephone Encounter (Signed)
Patient is requesting a generic called in for his albuterol because it is too expensive

## 2012-03-30 NOTE — Telephone Encounter (Signed)
Unfortunately, there are no current generic inhalers since the FDA required all inhalers that used the [place] propellant to be removed from the market

## 2012-03-30 NOTE — Telephone Encounter (Signed)
Patient informed. 

## 2012-04-04 ENCOUNTER — Ambulatory Visit (INDEPENDENT_AMBULATORY_CARE_PROVIDER_SITE_OTHER): Payer: Medicare Other | Admitting: Internal Medicine

## 2012-04-04 ENCOUNTER — Encounter: Payer: Self-pay | Admitting: Internal Medicine

## 2012-04-04 VITALS — BP 160/90 | HR 87 | Temp 98.2°F | Ht 75.0 in | Wt 191.5 lb

## 2012-04-04 DIAGNOSIS — I10 Essential (primary) hypertension: Secondary | ICD-10-CM | POA: Diagnosis not present

## 2012-04-04 DIAGNOSIS — S43499A Other sprain of unspecified shoulder joint, initial encounter: Secondary | ICD-10-CM

## 2012-04-04 DIAGNOSIS — M542 Cervicalgia: Secondary | ICD-10-CM | POA: Diagnosis not present

## 2012-04-04 DIAGNOSIS — S46819A Strain of other muscles, fascia and tendons at shoulder and upper arm level, unspecified arm, initial encounter: Secondary | ICD-10-CM | POA: Diagnosis not present

## 2012-04-04 DIAGNOSIS — S4380XA Sprain of other specified parts of unspecified shoulder girdle, initial encounter: Secondary | ICD-10-CM

## 2012-04-04 MED ORDER — CYCLOBENZAPRINE HCL 5 MG PO TABS: 5.0000 mg | ORAL_TABLET | Freq: Three times a day (TID) | ORAL | Status: DC | PRN

## 2012-04-04 MED ORDER — TRAMADOL HCL 50 MG PO TABS: 50.0000 mg | ORAL_TABLET | Freq: Four times a day (QID) | ORAL | Status: DC | PRN

## 2012-04-04 NOTE — Assessment & Plan Note (Signed)
Suspect flare of c-spine djd/ddd, d/w pt, exam and hx o/w bening and likely does not need further films or MRI, ok for tramadol prn,  to f/u any worsening symptoms or concerns

## 2012-04-04 NOTE — Progress Notes (Signed)
Subjective:    Patient ID: Nicholas Patton, male    DOB: 16-Aug-1934, 78 y.o.   MRN: [id number]  HPI  Here to f/u for acute with c/o "headache" but actually describes left sided neck pain at the upper neck, hears grinding to that side when turns head in horizontal plane to left, and has tenderness to the side of the neck and ridge of the upper back and some radiation of the pain it seems to the left postlat head, but no ear, sinus or other head pain, no ST, cough and Pt denies chest pain, increased sob or doe, wheezing, orthopnea, PND, increased LE swelling, palpitations, dizziness or syncope.  No obvious inciting event, has been mod pain, intermittent for over a wk, worse at night to lie down or turn the head left, but no bowel or bladder change, fever, wt loss,  worsening extremity pain/numbness/weakness, gait change or falls.  BP has been overall ok at home, though did have a mildly increase to 140/96 two days ago.  Remembers having c-spine film with degenerative changes at a chiropracter a few yrs ago.   He is wondering if he needs an MRI for the neck.  Some nervous today about this.  Pain has not responded to tylenol at home (or a dose or two of alleve) Past Medical History  Diagnosis Date  . ALLERGIC RHINITIS 01/24/2007  . BENIGN PROSTATIC HYPERTROPHY 01/24/2007  . BRONCHITIS, ACUTE 06/11/2008  . Cough 01/24/2007  . DIABETES MELLITUS, TYPE II 01/24/2007  . FATIGUE 07/27/2007  . HYPERLIPIDEMIA 01/24/2007  . HYPERTENSION 10/05/2006  . INSOMNIA 06/21/2008  . LEG PAIN, BILATERAL 10/06/2007  . PERIPHERAL NEUROPATHY, FEET 10/06/2007  . PERS HX TOBACCO USE PRESENTING HAZARDS HEALTH 09/17/2009  . VITAMIN B12 DEFICIENCY 09/06/2008  . VITAMIN D DEFICIENCY 01/16/2010  . Wheezing 01/16/2010   Past Surgical History  Procedure Date  . Total knee arthroplasty 2001    Left  . Turp vaporization     reports that he has quit smoking. He does not have any smokeless tobacco history on file. He reports that he does  not drink alcohol or use illicit drugs. family history includes Lung cancer in his brother. No Known Allergies Current Outpatient Prescriptions on File Prior to Visit  Medication Sig Dispense Refill  . albuterol (PROAIR HFA) 108 (90 BASE) MCG/ACT inhaler Inhale 2 puffs into the lungs 4 (four) times daily as needed.  1 Inhaler  5  . aspirin 81 MG EC tablet Take 81 mg by mouth daily.        Marland Kitchen atorvastatin (LIPITOR) 40 MG tablet Take 1 tablet (40 mg total) by mouth daily.  90 tablet  3  . Cholecalciferol (VITAMIN D3) 1000 UNITS CAPS Take by mouth daily.        . cyanocobalamin (,VITAMIN B-12,) 1000 MCG/ML injection Inject 1,000 mcg into the muscle every 30 (thirty) days.        Marland Kitchen glimepiride (AMARYL) 1 MG tablet Only take 1/2 pill twice per day  90 tablet  3  . losartan-hydrochlorothiazide (HYZAAR) 100-25 MG per tablet Take 1 tablet by mouth daily.  90 tablet  3  . metFORMIN (GLUCOPHAGE) 500 MG tablet Take 2 tablets (1,000 mg total) by mouth 2 (two) times daily with a meal.  360 tablet  3  . omeprazole (PRILOSEC) 20 MG capsule Take 1 capsule (20 mg total) by mouth daily.  90 capsule  3  . zolpidem (AMBIEN) 5 MG tablet Take 1 tablet (5 mg total) by  mouth at bedtime as needed for sleep.  30 tablet  5   Review of Systems  Constitutional: Negative for unexpected weight change, or unusual diaphoresis  HENT: Negative for tinnitus.   Eyes: Negative for photophobia and visual disturbance.  Respiratory: Negative for choking and stridor.   Gastrointestinal: Negative for vomiting and blood in stool.  Genitourinary: Negative for hematuria and decreased urine volume.  Musculoskeletal: Negative for acute joint swelling Skin: Negative for color change and wound.  Neurological: Negative for tremors and numbness other than noted  Psychiatric/Behavioral: Negative for decreased concentration or  hyperactivity.       Objective:   Physical Exam BP 160/90  Pulse 87  Temp 98.2 F (36.8 C) (Oral)  Ht 6' 3"  (1.905 m)  Wt 191 lb 8 oz (86.864 kg)  BMI 23.94 kg/m2  SpO2 94% VS noted, not ill appearing Constitutional: Pt appears well-developed and well-nourished.  HENT: Head: NCAT.  Right Ear: External ear normal.  Left Ear: External ear normal.  Eyes: Conjunctivae and EOM are normal. Pupils are equal, round, and reactive to light.  Neck: Normal range of motion. Neck supple.  C-spine nontender but has left paravertebral tender as well as spasm/tender left trapezoid Cardiovascular: Normal rate and regular rhythm.   Pulmonary/Chest: Effort normal and breath sounds normal.  - no rales or wheezing Neurological: Pt is alert. Not confused , cn 2-12 intact, motor/dtr/sens/gait intact throughout Skin: Skin is warm. No erythema.  Psychiatric: Pt behavior is normal. Thought content normal.     Assessment & Plan:

## 2012-04-04 NOTE — Assessment & Plan Note (Signed)
Mild elevated today, o/w stable overall by history and exam, recent data reviewed with pt, and pt to continue medical treatment as before,  to f/u any worsening symptoms or concerns BP Readings from Last 3 Encounters:  04/04/12 160/90  03/15/12 142/60  03/07/12 128/70

## 2012-04-04 NOTE — Assessment & Plan Note (Signed)
Unclear how occurred, will tx with flexeril prn, consider trigger point injections,  to f/u any worsening symptoms or concerns

## 2012-04-04 NOTE — Patient Instructions (Addendum)
Please take all new medication as prescribed Please continue all other medications as before

## 2012-04-07 ENCOUNTER — Ambulatory Visit: Payer: Medicare Other | Admitting: Internal Medicine

## 2012-04-13 ENCOUNTER — Telehealth: Payer: Self-pay | Admitting: Internal Medicine

## 2012-04-13 DIAGNOSIS — M542 Cervicalgia: Secondary | ICD-10-CM

## 2012-04-13 NOTE — Telephone Encounter (Signed)
Caller: Nicholas Patton/Patient; Phone: [phone]; Reason for Call: Patient was seen on 04/04/12 for headaches and given medication (Tramadol) to help with the pain.  Patient reports that the medication has not improved the pain.  Reports pain is still the same as when he was seen by Dr.  [name].  Patient feels like he may need an x-ray to see if there is a physical problem.  Please call patient back regarding this.

## 2012-04-13 NOTE — Telephone Encounter (Signed)
Xray could show a problem such as degenerative arthritis, but we already suspect and know this from his last visit.  His exam last visit did not seem to require and MRI done.  If the pain is continuing, the next step is to see orthopedic,  So I can refer if he wants

## 2012-04-14 NOTE — Telephone Encounter (Signed)
Please refer to ortho

## 2012-04-14 NOTE — Telephone Encounter (Signed)
Referral done

## 2012-04-15 ENCOUNTER — Encounter (HOSPITAL_COMMUNITY): Payer: Self-pay | Admitting: *Deleted

## 2012-04-15 ENCOUNTER — Emergency Department (HOSPITAL_COMMUNITY): Payer: Medicare Other

## 2012-04-15 ENCOUNTER — Emergency Department (HOSPITAL_COMMUNITY)
Admission: EM | Admit: 2012-04-15 | Discharge: 2012-04-15 | Disposition: A | Discharge status: Home / Self Care | Payer: Medicare Other | Attending: Emergency Medicine | Admitting: Emergency Medicine

## 2012-04-15 DIAGNOSIS — Z8659 Personal history of other mental and behavioral disorders: Secondary | ICD-10-CM | POA: Diagnosis not present

## 2012-04-15 DIAGNOSIS — Z8669 Personal history of other diseases of the nervous system and sense organs: Secondary | ICD-10-CM | POA: Insufficient documentation

## 2012-04-15 DIAGNOSIS — Z7982 Long term (current) use of aspirin: Secondary | ICD-10-CM | POA: Insufficient documentation

## 2012-04-15 DIAGNOSIS — Z8639 Personal history of other endocrine, nutritional and metabolic disease: Secondary | ICD-10-CM | POA: Diagnosis not present

## 2012-04-15 DIAGNOSIS — Z8739 Personal history of other diseases of the musculoskeletal system and connective tissue: Secondary | ICD-10-CM | POA: Insufficient documentation

## 2012-04-15 DIAGNOSIS — R51 Headache: Secondary | ICD-10-CM | POA: Insufficient documentation

## 2012-04-15 DIAGNOSIS — R209 Unspecified disturbances of skin sensation: Secondary | ICD-10-CM | POA: Diagnosis not present

## 2012-04-15 DIAGNOSIS — Z87891 Personal history of nicotine dependence: Secondary | ICD-10-CM | POA: Diagnosis not present

## 2012-04-15 DIAGNOSIS — Z87448 Personal history of other diseases of urinary system: Secondary | ICD-10-CM | POA: Diagnosis not present

## 2012-04-15 DIAGNOSIS — Z79899 Other long term (current) drug therapy: Secondary | ICD-10-CM | POA: Diagnosis not present

## 2012-04-15 DIAGNOSIS — E785 Hyperlipidemia, unspecified: Secondary | ICD-10-CM | POA: Insufficient documentation

## 2012-04-15 DIAGNOSIS — Z8709 Personal history of other diseases of the respiratory system: Secondary | ICD-10-CM | POA: Insufficient documentation

## 2012-04-15 DIAGNOSIS — I10 Essential (primary) hypertension: Secondary | ICD-10-CM | POA: Insufficient documentation

## 2012-04-15 DIAGNOSIS — E119 Type 2 diabetes mellitus without complications: Secondary | ICD-10-CM | POA: Insufficient documentation

## 2012-04-15 LAB — POCT I-STAT, CHEM 8
BUN: 17 mg/dL (ref 6–23)
Calcium, Ion: 1.2 mmol/L (ref 1.13–1.30)
Chloride: 102 mEq/L (ref 96–112)
Creatinine, Ser: 1.2 mg/dL (ref 0.50–1.35)
Glucose, Bld: 148 mg/dL — ABNORMAL HIGH (ref 70–99)
HCT: 41 % (ref 39.0–52.0)
Hemoglobin: 13.9 g/dL (ref 13.0–17.0)
Potassium: 3.4 mEq/L — ABNORMAL LOW (ref 3.5–5.1)
Sodium: 141 mEq/L (ref 135–145)
TCO2: 30 mmol/L (ref 0–100)

## 2012-04-15 LAB — SEDIMENTATION RATE: Sed Rate: 5 mm/hr (ref 0–16)

## 2012-04-15 MED ORDER — POTASSIUM CHLORIDE CRYS ER 20 MEQ PO TBCR
40.0000 meq | EXTENDED_RELEASE_TABLET | Freq: Once | ORAL | Status: AC
  Administered 2012-04-15: 40 meq via ORAL
  Filled 2012-04-15: qty 2

## 2012-04-15 NOTE — ED Notes (Addendum)
Pt c/o HA since waking this morning, was seen previous this month for HA as well.  Has been taking tramadol with no relief.  No facial droop, no slurred speech, grip strength equal, no arm drift.

## 2012-04-15 NOTE — ED Provider Notes (Signed)
History     CSN: [id number]  Arrival date & time 04/15/12  6440   First MD Initiated Contact with Patient 04/15/12 0701      Chief Complaint  Patient presents with  . Headache    (Consider location/radiation/quality/duration/timing/severity/associated sxs/prior treatment) HPI Complains of headache gradual in onset 2-3 weeks ago. Headache is frontal in location. Waxes and wanes. Nothing makes symptoms better or worse. Accompanying symptoms include some numbness in the right side of his face and feeling of both ears clogged up. No difficulty speaking no difficulty in moving arms or legs no lack of coordination no other complaint. Treated with tramadol and with a muscle relaxer with minimal relief. Headache is minimal at present. No fever no trauma. No other associated symptoms. Nothing makes symptoms better or worse Past Medical History  Diagnosis Date  . ALLERGIC RHINITIS 01/24/2007  . BENIGN PROSTATIC HYPERTROPHY 01/24/2007  . BRONCHITIS, ACUTE 06/11/2008  . Cough 01/24/2007  . DIABETES MELLITUS, TYPE II 01/24/2007  . FATIGUE 07/27/2007  . HYPERLIPIDEMIA 01/24/2007  . HYPERTENSION 10/05/2006  . INSOMNIA 06/21/2008  . LEG PAIN, BILATERAL 10/06/2007  . PERIPHERAL NEUROPATHY, FEET 10/06/2007  . PERS HX TOBACCO USE PRESENTING HAZARDS HEALTH 09/17/2009  . VITAMIN B12 DEFICIENCY 09/06/2008  . VITAMIN D DEFICIENCY 01/16/2010  . Wheezing 01/16/2010    Past Surgical History  Procedure Date  . Total knee arthroplasty 2001    Left  . Turp vaporization     Family History  Problem Relation Age of Onset  . Lung cancer Brother     History  Substance Use Topics  . Smoking status: Former Research scientist (life sciences)  . Smokeless tobacco: Not on file  . Alcohol Use: No      Review of Systems  Constitutional: Negative.   HENT:       Earr congestion  Respiratory: Negative.   Cardiovascular: Negative.   Gastrointestinal: Negative.   Musculoskeletal: Negative.   Skin: Negative.   Neurological: Positive  for numbness and headaches.  Hematological: Negative.   Psychiatric/Behavioral: Negative.   All other systems reviewed and are negative.    Allergies  Review of patient's allergies indicates no known allergies.  Home Medications   Current Outpatient Rx  Name  Route  Sig  Dispense  Refill  . ALBUTEROL SULFATE HFA 108 (90 BASE) MCG/ACT IN AERS   Inhalation   Inhale 2 puffs into the lungs 4 (four) times daily as needed.   1 Inhaler   5   . ASPIRIN 81 MG PO TBEC   Oral   Take 81 mg by mouth daily.           . ATORVASTATIN CALCIUM 40 MG PO TABS   Oral   Take 1 tablet (40 mg total) by mouth daily.   90 tablet   3   . VITAMIN D3 1000 UNITS PO CAPS   Oral   Take by mouth daily.           . CYCLOBENZAPRINE HCL 5 MG PO TABS   Oral   Take 1 tablet (5 mg total) by mouth 3 (three) times daily as needed for muscle spasms.   60 tablet   1   . GLIMEPIRIDE 1 MG PO TABS      Only take 1/2 pill twice per day   90 tablet   3   . LOSARTAN POTASSIUM-HCTZ 100-25 MG PO TABS   Oral   Take 1 tablet by mouth daily.   90 tablet   3   .  METFORMIN HCL 500 MG PO TABS   Oral   Take 2 tablets (1,000 mg total) by mouth 2 (two) times daily with a meal.   360 tablet   3   . OMEPRAZOLE 20 MG PO CPDR   Oral   Take 1 capsule (20 mg total) by mouth daily.   90 capsule   3   . TRAMADOL HCL 50 MG PO TABS   Oral   Take 1 tablet (50 mg total) by mouth every 6 (six) hours as needed for pain.   60 tablet   1   . ZOLPIDEM TARTRATE 5 MG PO TABS   Oral   Take 1 tablet (5 mg total) by mouth at bedtime as needed for sleep.   30 tablet   5     BP 180/80  Pulse 113  Temp 97.8 F (36.6 C) (Oral)  Resp 16  SpO2 97%  Physical Exam  Nursing note and vitals reviewed. Constitutional: He is oriented to person, place, and time. He appears well-developed and well-nourished.  HENT:  Head: Normocephalic and atraumatic.  Eyes: Conjunctivae normal are normal. Pupils are equal, round, and  reactive to light.  Neck: Neck supple. No tracheal deviation present. No thyromegaly present.  Cardiovascular: Normal rate and regular rhythm.   No murmur heard.      Heart rate counted 92 by me  Pulmonary/Chest: Effort normal and breath sounds normal.  Abdominal: Soft. Bowel sounds are normal. He exhibits no distension. There is no tenderness.  Musculoskeletal: Normal range of motion. He exhibits no edema and no tenderness.  Neurological: He is alert and oriented to person, place, and time. He has normal reflexes. No cranial nerve deficit. Coordination normal.       No sensory deficit. Gait normal Romberg normal pronator drift normal  Skin: Skin is warm and dry. No rash noted.  Psychiatric: He has a normal mood and affect. His behavior is normal.    ED Course  Procedures (including critical care time)  Labs Reviewed - No data to display No results found.   No diagnosis found.   Declines pain medicine presently  10:15 AM patient remains alert appropriate states headache is minimal continued on pain medicine MDM  Plan Tylenol as needed for pain. Patient reports he has an appointment with an orthopedic specialist for 04/18/2012 regarding his neck pain. He can also follow up with Dr. [name] Diagnosis #1 nonspecific headache #2 hyperglycemia #3  hypokalemia        Orlie Dakin, MD 04/15/12 1021

## 2012-04-15 NOTE — ED Notes (Signed)
Pt given discharge paperwork; pt verbalized understanding of discharge and f/u; no additional questions by pt; unable to obtain e-signature due to signature pad not working;

## 2012-04-15 NOTE — ED Notes (Signed)
Patient transported to CT 

## 2012-04-19 DIAGNOSIS — M47812 Spondylosis without myelopathy or radiculopathy, cervical region: Secondary | ICD-10-CM | POA: Diagnosis not present

## 2012-04-23 ENCOUNTER — Other Ambulatory Visit: Payer: Self-pay

## 2012-04-23 DIAGNOSIS — H2510 Age-related nuclear cataract, unspecified eye: Secondary | ICD-10-CM | POA: Diagnosis not present

## 2012-04-23 DIAGNOSIS — H40029 Open angle with borderline findings, high risk, unspecified eye: Secondary | ICD-10-CM | POA: Diagnosis not present

## 2012-04-23 DIAGNOSIS — H35369 Drusen (degenerative) of macula, unspecified eye: Secondary | ICD-10-CM | POA: Diagnosis not present

## 2012-04-23 DIAGNOSIS — E119 Type 2 diabetes mellitus without complications: Secondary | ICD-10-CM | POA: Diagnosis not present

## 2012-05-03 DIAGNOSIS — M5030 Other cervical disc degeneration, unspecified cervical region: Secondary | ICD-10-CM | POA: Diagnosis not present

## 2012-05-03 DIAGNOSIS — M542 Cervicalgia: Secondary | ICD-10-CM | POA: Diagnosis not present

## 2012-05-10 DIAGNOSIS — M5030 Other cervical disc degeneration, unspecified cervical region: Secondary | ICD-10-CM | POA: Diagnosis not present

## 2012-05-10 DIAGNOSIS — M542 Cervicalgia: Secondary | ICD-10-CM | POA: Diagnosis not present

## 2012-05-17 DIAGNOSIS — M47812 Spondylosis without myelopathy or radiculopathy, cervical region: Secondary | ICD-10-CM | POA: Diagnosis not present

## 2012-05-17 DIAGNOSIS — M542 Cervicalgia: Secondary | ICD-10-CM | POA: Diagnosis not present

## 2012-05-17 DIAGNOSIS — M5030 Other cervical disc degeneration, unspecified cervical region: Secondary | ICD-10-CM | POA: Diagnosis not present

## 2012-05-24 DIAGNOSIS — M542 Cervicalgia: Secondary | ICD-10-CM | POA: Diagnosis not present

## 2012-05-24 DIAGNOSIS — M5030 Other cervical disc degeneration, unspecified cervical region: Secondary | ICD-10-CM | POA: Diagnosis not present

## 2012-05-30 DIAGNOSIS — M542 Cervicalgia: Secondary | ICD-10-CM | POA: Diagnosis not present

## 2012-05-30 DIAGNOSIS — M5030 Other cervical disc degeneration, unspecified cervical region: Secondary | ICD-10-CM | POA: Diagnosis not present

## 2012-07-17 ENCOUNTER — Emergency Department (HOSPITAL_COMMUNITY)
Admission: EM | Admit: 2012-07-17 | Discharge: 2012-07-17 | Disposition: A | Discharge status: Home / Self Care | Payer: Medicare Other | Source: Home / Self Care | Attending: Emergency Medicine | Admitting: Emergency Medicine

## 2012-07-17 ENCOUNTER — Encounter (HOSPITAL_COMMUNITY): Payer: Self-pay | Admitting: *Deleted

## 2012-07-17 ENCOUNTER — Inpatient Hospital Stay (HOSPITAL_COMMUNITY)
Admission: EM | Admit: 2012-07-17 | Discharge: 2012-07-22 | DRG: 418 | Disposition: A | Discharge status: Home / Self Care | Payer: Medicare Other | Attending: General Surgery | Admitting: General Surgery

## 2012-07-17 ENCOUNTER — Emergency Department (HOSPITAL_COMMUNITY): Payer: Medicare Other

## 2012-07-17 DIAGNOSIS — Z8669 Personal history of other diseases of the nervous system and sense organs: Secondary | ICD-10-CM | POA: Insufficient documentation

## 2012-07-17 DIAGNOSIS — K8020 Calculus of gallbladder without cholecystitis without obstruction: Secondary | ICD-10-CM | POA: Insufficient documentation

## 2012-07-17 DIAGNOSIS — K810 Acute cholecystitis: Secondary | ICD-10-CM | POA: Diagnosis not present

## 2012-07-17 DIAGNOSIS — K567 Ileus, unspecified: Secondary | ICD-10-CM | POA: Diagnosis not present

## 2012-07-17 DIAGNOSIS — I452 Bifascicular block: Secondary | ICD-10-CM | POA: Diagnosis not present

## 2012-07-17 DIAGNOSIS — K5730 Diverticulosis of large intestine without perforation or abscess without bleeding: Secondary | ICD-10-CM | POA: Diagnosis not present

## 2012-07-17 DIAGNOSIS — E119 Type 2 diabetes mellitus without complications: Secondary | ICD-10-CM | POA: Diagnosis present

## 2012-07-17 DIAGNOSIS — I10 Essential (primary) hypertension: Secondary | ICD-10-CM | POA: Diagnosis not present

## 2012-07-17 DIAGNOSIS — R1011 Right upper quadrant pain: Secondary | ICD-10-CM | POA: Diagnosis not present

## 2012-07-17 DIAGNOSIS — J984 Other disorders of lung: Secondary | ICD-10-CM | POA: Diagnosis not present

## 2012-07-17 DIAGNOSIS — K219 Gastro-esophageal reflux disease without esophagitis: Secondary | ICD-10-CM | POA: Diagnosis present

## 2012-07-17 DIAGNOSIS — R112 Nausea with vomiting, unspecified: Secondary | ICD-10-CM | POA: Insufficient documentation

## 2012-07-17 DIAGNOSIS — IMO0001 Reserved for inherently not codable concepts without codable children: Secondary | ICD-10-CM | POA: Diagnosis not present

## 2012-07-17 DIAGNOSIS — D62 Acute posthemorrhagic anemia: Secondary | ICD-10-CM | POA: Diagnosis not present

## 2012-07-17 DIAGNOSIS — Z862 Personal history of diseases of the blood and blood-forming organs and certain disorders involving the immune mechanism: Secondary | ICD-10-CM | POA: Insufficient documentation

## 2012-07-17 DIAGNOSIS — Z87891 Personal history of nicotine dependence: Secondary | ICD-10-CM

## 2012-07-17 DIAGNOSIS — R918 Other nonspecific abnormal finding of lung field: Secondary | ICD-10-CM | POA: Diagnosis not present

## 2012-07-17 DIAGNOSIS — Z8639 Personal history of other endocrine, nutritional and metabolic disease: Secondary | ICD-10-CM | POA: Insufficient documentation

## 2012-07-17 DIAGNOSIS — K8050 Calculus of bile duct without cholangitis or cholecystitis without obstruction: Secondary | ICD-10-CM

## 2012-07-17 DIAGNOSIS — E785 Hyperlipidemia, unspecified: Secondary | ICD-10-CM | POA: Diagnosis present

## 2012-07-17 DIAGNOSIS — K9189 Other postprocedural complications and disorders of digestive system: Secondary | ICD-10-CM | POA: Diagnosis not present

## 2012-07-17 DIAGNOSIS — K8010 Calculus of gallbladder with chronic cholecystitis without obstruction: Secondary | ICD-10-CM | POA: Diagnosis not present

## 2012-07-17 DIAGNOSIS — Z96659 Presence of unspecified artificial knee joint: Secondary | ICD-10-CM

## 2012-07-17 DIAGNOSIS — K8000 Calculus of gallbladder with acute cholecystitis without obstruction: Principal | ICD-10-CM | POA: Diagnosis present

## 2012-07-17 DIAGNOSIS — Z7982 Long term (current) use of aspirin: Secondary | ICD-10-CM | POA: Insufficient documentation

## 2012-07-17 DIAGNOSIS — M549 Dorsalgia, unspecified: Secondary | ICD-10-CM | POA: Insufficient documentation

## 2012-07-17 DIAGNOSIS — R5082 Postprocedural fever: Secondary | ICD-10-CM | POA: Diagnosis not present

## 2012-07-17 DIAGNOSIS — Z8709 Personal history of other diseases of the respiratory system: Secondary | ICD-10-CM | POA: Insufficient documentation

## 2012-07-17 DIAGNOSIS — Z87448 Personal history of other diseases of urinary system: Secondary | ICD-10-CM | POA: Insufficient documentation

## 2012-07-17 DIAGNOSIS — N269 Renal sclerosis, unspecified: Secondary | ICD-10-CM | POA: Diagnosis not present

## 2012-07-17 DIAGNOSIS — Z79899 Other long term (current) drug therapy: Secondary | ICD-10-CM | POA: Insufficient documentation

## 2012-07-17 DIAGNOSIS — R0989 Other specified symptoms and signs involving the circulatory and respiratory systems: Secondary | ICD-10-CM | POA: Diagnosis not present

## 2012-07-17 HISTORY — DX: Ileus, unspecified: K56.7

## 2012-07-17 HISTORY — DX: Other postprocedural complications and disorders of digestive system: K91.89

## 2012-07-17 LAB — LIPASE, BLOOD
Lipase: 37 U/L (ref 11–59)
Lipase: 16 U/L (ref 11–59)

## 2012-07-17 LAB — CBC WITH DIFFERENTIAL/PLATELET
Basophils Absolute: 0 10*3/uL (ref 0.0–0.1)
Basophils Absolute: 0 10*3/uL (ref 0.0–0.1)
Basophils Relative: 1 % (ref 0–1)
Basophils Relative: 0 % (ref 0–1)
Eosinophils Absolute: 0.2 10*3/uL (ref 0.0–0.7)
Eosinophils Absolute: 0 10*3/uL (ref 0.0–0.7)
Eosinophils Relative: 5 % (ref 0–5)
Eosinophils Relative: 0 % (ref 0–5)
HCT: 40.8 % (ref 39.0–52.0)
HCT: 37.5 % — ABNORMAL LOW (ref 39.0–52.0)
Hemoglobin: 13.3 g/dL (ref 13.0–17.0)
Hemoglobin: 12.4 g/dL — ABNORMAL LOW (ref 13.0–17.0)
Lymphocytes Relative: 32 % (ref 12–46)
Lymphocytes Relative: 8 % — ABNORMAL LOW (ref 12–46)
Lymphs Abs: 1.6 10*3/uL (ref 0.7–4.0)
Lymphs Abs: 0.7 10*3/uL (ref 0.7–4.0)
MCH: 28.5 pg (ref 26.0–34.0)
MCH: 28.6 pg (ref 26.0–34.0)
MCHC: 32.6 g/dL (ref 30.0–36.0)
MCHC: 33.1 g/dL (ref 30.0–36.0)
MCV: 87.6 fL (ref 78.0–100.0)
MCV: 86.6 fL (ref 78.0–100.0)
Monocytes Absolute: 0.5 10*3/uL (ref 0.1–1.0)
Monocytes Absolute: 0.7 10*3/uL (ref 0.1–1.0)
Monocytes Relative: 9 % (ref 3–12)
Monocytes Relative: 8 % (ref 3–12)
Neutro Abs: 2.6 10*3/uL (ref 1.7–7.7)
Neutro Abs: 7.6 10*3/uL (ref 1.7–7.7)
Neutrophils Relative %: 53 % (ref 43–77)
Neutrophils Relative %: 84 % — ABNORMAL HIGH (ref 43–77)
Platelets: 191 10*3/uL (ref 150–400)
Platelets: 200 10*3/uL (ref 150–400)
RBC: 4.66 MIL/uL (ref 4.22–5.81)
RBC: 4.33 MIL/uL (ref 4.22–5.81)
RDW: 14.2 % (ref 11.5–15.5)
RDW: 14.2 % (ref 11.5–15.5)
WBC: 5 10*3/uL (ref 4.0–10.5)
WBC: 9 10*3/uL (ref 4.0–10.5)

## 2012-07-17 LAB — COMPREHENSIVE METABOLIC PANEL
ALT: 15 U/L (ref 0–53)
ALT: 20 U/L (ref 0–53)
AST: 23 U/L (ref 0–37)
AST: 26 U/L (ref 0–37)
Albumin: 4.3 g/dL (ref 3.5–5.2)
Albumin: 3.9 g/dL (ref 3.5–5.2)
Alkaline Phosphatase: 109 U/L (ref 39–117)
Alkaline Phosphatase: 68 U/L (ref 39–117)
BUN: 19 mg/dL (ref 6–23)
BUN: 20 mg/dL (ref 6–23)
CO2: 28 mEq/L (ref 19–32)
CO2: 27 mEq/L (ref 19–32)
Calcium: 9.9 mg/dL (ref 8.4–10.5)
Calcium: 9.3 mg/dL (ref 8.4–10.5)
Chloride: 104 mEq/L (ref 96–112)
Chloride: 102 mEq/L (ref 96–112)
Creatinine, Ser: 1.23 mg/dL (ref 0.50–1.35)
Creatinine, Ser: 1.36 mg/dL — ABNORMAL HIGH (ref 0.50–1.35)
GFR calc Af Amer: 64 mL/min — ABNORMAL LOW (ref >90)
GFR calc Af Amer: 57 mL/min — ABNORMAL LOW (ref >90)
GFR calc non Af Amer: 55 mL/min — ABNORMAL LOW (ref >90)
GFR calc non Af Amer: 49 mL/min — ABNORMAL LOW (ref >90)
Glucose, Bld: 141 mg/dL — ABNORMAL HIGH (ref 70–99)
Glucose, Bld: 153 mg/dL — ABNORMAL HIGH (ref 70–99)
Potassium: 4.4 mEq/L (ref 3.5–5.1)
Potassium: 4.5 mEq/L (ref 3.5–5.1)
Sodium: 143 mEq/L (ref 135–145)
Sodium: 139 mEq/L (ref 135–145)
Total Bilirubin: 0.3 mg/dL (ref 0.3–1.2)
Total Bilirubin: 0.6 mg/dL (ref 0.3–1.2)
Total Protein: 7.2 g/dL (ref 6.0–8.3)
Total Protein: 6.8 g/dL (ref 6.0–8.3)

## 2012-07-17 LAB — AMYLASE: Amylase: 98 U/L (ref 0–105)

## 2012-07-17 LAB — URINALYSIS, ROUTINE W REFLEX MICROSCOPIC
Bilirubin Urine: NEGATIVE
Glucose, UA: NEGATIVE mg/dL
Hgb urine dipstick: NEGATIVE
Ketones, ur: NEGATIVE mg/dL
Leukocytes, UA: NEGATIVE
Nitrite: NEGATIVE
Protein, ur: NEGATIVE mg/dL
Specific Gravity, Urine: 1.019 (ref 1.005–1.030)
Urobilinogen, UA: 0.2 mg/dL (ref 0.0–1.0)
pH: 5 (ref 5.0–8.0)

## 2012-07-17 LAB — LACTIC ACID, PLASMA: Lactic Acid, Venous: 1.4 mmol/L (ref 0.5–2.2)

## 2012-07-17 LAB — TROPONIN I: Troponin I: <0.3 ng/mL (ref <0.30)

## 2012-07-17 MED ORDER — SODIUM CHLORIDE 0.9 % IV SOLN
INTRAVENOUS | Status: DC
  Administered 2012-07-17: 100 mL/h via INTRAVENOUS

## 2012-07-17 MED ORDER — ONDANSETRON 4 MG PO TBDP: 4.0000 mg | ORAL_TABLET | Freq: Three times a day (TID) | ORAL | Status: DC | PRN

## 2012-07-17 MED ORDER — IOHEXOL 300 MG/ML  SOLN
50.0000 mL | Freq: Once | INTRAMUSCULAR | Status: AC | PRN
  Administered 2012-07-17: 50 mL via ORAL

## 2012-07-17 MED ORDER — HYDROMORPHONE HCL PF 1 MG/ML IJ SOLN
0.5000 mg | Freq: Once | INTRAMUSCULAR | Status: AC
  Administered 2012-07-17: 0.5 mg via INTRAVENOUS

## 2012-07-17 MED ORDER — OXYCODONE-ACETAMINOPHEN 5-325 MG PO TABS: 1.0000 | ORAL_TABLET | Freq: Four times a day (QID) | ORAL | Status: DC | PRN

## 2012-07-17 MED ORDER — HYDROMORPHONE HCL PF 1 MG/ML IJ SOLN
1.0000 mg | Freq: Once | INTRAMUSCULAR | Status: AC
  Administered 2012-07-17: 0.5 mg via INTRAVENOUS
  Filled 2012-07-17: qty 1

## 2012-07-17 MED ORDER — ACETAMINOPHEN 325 MG PO TABS
650.0000 mg | ORAL_TABLET | Freq: Once | ORAL | Status: AC
  Administered 2012-07-17: 650 mg via ORAL
  Filled 2012-07-17: qty 2

## 2012-07-17 MED ORDER — SODIUM CHLORIDE 0.9 % IV BOLUS (SEPSIS)
500.0000 mL | Freq: Once | INTRAVENOUS | Status: AC
  Administered 2012-07-17: 500 mL via INTRAVENOUS

## 2012-07-17 MED ORDER — IOHEXOL 300 MG/ML  SOLN
100.0000 mL | Freq: Once | INTRAMUSCULAR | Status: AC | PRN
  Administered 2012-07-17: 100 mL via INTRAVENOUS

## 2012-07-17 MED ORDER — SODIUM CHLORIDE 0.9 % IV BOLUS (SEPSIS)
250.0000 mL | Freq: Once | INTRAVENOUS | Status: AC
  Administered 2012-07-17: 250 mL via INTRAVENOUS

## 2012-07-17 MED ORDER — ONDANSETRON HCL 4 MG/2ML IJ SOLN
4.0000 mg | Freq: Once | INTRAMUSCULAR | Status: AC
  Administered 2012-07-17: 4 mg via INTRAVENOUS
  Filled 2012-07-17: qty 2

## 2012-07-17 MED ORDER — SODIUM CHLORIDE 0.9 % IV SOLN
1.0000 g | Freq: Once | INTRAVENOUS | Status: AC
  Administered 2012-07-18: 1 g via INTRAVENOUS
  Filled 2012-07-17: qty 1

## 2012-07-17 MED ORDER — HYDROMORPHONE HCL PF 1 MG/ML IJ SOLN
1.0000 mg | Freq: Once | INTRAMUSCULAR | Status: AC
  Administered 2012-07-17: 1 mg via INTRAVENOUS
  Filled 2012-07-17: qty 1

## 2012-07-17 MED ORDER — IBUPROFEN 800 MG PO TABS
800.0000 mg | ORAL_TABLET | Freq: Once | ORAL | Status: AC
  Administered 2012-07-17: 800 mg via ORAL
  Filled 2012-07-17: qty 1

## 2012-07-17 NOTE — ED Notes (Signed)
Pt states that upper right quadrant abdominal pain woke him from sleep and that it radiates to his back

## 2012-07-17 NOTE — ED Notes (Signed)
Pt transported to CT scan.

## 2012-07-17 NOTE — ED Notes (Signed)
Pt also complains of HA

## 2012-07-17 NOTE — ED Provider Notes (Signed)
History     CSN: [id number]  Arrival date & time 07/17/12  2024   First MD Initiated Contact with Patient 07/17/12 2056      Chief Complaint  Patient presents with  . Fever   HPI  History provided by the patient, family and recent medical chart. Patient is a 78 year old male with history of hypertension, hyperlipidemia and diabetes who returns to the emergency room with complaints of fever. Patient was initially seen early this morning for complaints of right upper abdominal pain and episodes of vomiting. She had normal lab testing but was found to have gallstones and some thickening around his gallbladder on CT scan. Patient was feeling better with pain and nausea in the emergency department at that time felt well to go home. He was given prescriptions for pain medication as well as instructions to followup with general surgeon in his eye very care provider tomorrow. He was also instructed to return if he develops any worsening symptoms or develop fever. Through the late afternoon patient began to feel hot with slight chills. His daughter checked his temperature several times with 2 different thermometers each of which read 102 Fahrenheit. Patient did take one of his Percocets around 11pm.    Past Medical History  Diagnosis Date  . ALLERGIC RHINITIS 01/24/2007  . BENIGN PROSTATIC HYPERTROPHY 01/24/2007  . BRONCHITIS, ACUTE 06/11/2008  . Cough 01/24/2007  . DIABETES MELLITUS, TYPE II 01/24/2007  . FATIGUE 07/27/2007  . HYPERLIPIDEMIA 01/24/2007  . HYPERTENSION 10/05/2006  . INSOMNIA 06/21/2008  . LEG PAIN, BILATERAL 10/06/2007  . PERIPHERAL NEUROPATHY, FEET 10/06/2007  . PERS HX TOBACCO USE PRESENTING HAZARDS HEALTH 09/17/2009  . VITAMIN B12 DEFICIENCY 09/06/2008  . VITAMIN D DEFICIENCY 01/16/2010  . Wheezing 01/16/2010    Past Surgical History  Procedure Laterality Date  . Total knee arthroplasty  2001    Left  . Turp vaporization      Family History  Problem Relation Age of Onset   . Lung cancer Brother     History  Substance Use Topics  . Smoking status: Former Research scientist (life sciences)  . Smokeless tobacco: Not on file  . Alcohol Use: No      Review of Systems  Constitutional: Positive for fever. Negative for chills.  Gastrointestinal: Positive for nausea and abdominal pain. Negative for diarrhea.  All other systems reviewed and are negative.    Allergies  Review of patient's allergies indicates no known allergies.  Home Medications   Current Outpatient Rx  Name  Route  Sig  Dispense  Refill  . aspirin 81 MG EC tablet   Oral   Take 81 mg by mouth daily.           Marland Kitchen atorvastatin (LIPITOR) 40 MG tablet   Oral   Take 40 mg by mouth daily.         . Cholecalciferol (VITAMIN D3) 1000 UNITS CAPS   Oral   Take 1 capsule by mouth daily.          Marland Kitchen glimepiride (AMARYL) 1 MG tablet   Oral   Take 0.5 mg by mouth 2 (two) times daily before a meal.          . losartan-hydrochlorothiazide (HYZAAR) 100-25 MG per tablet   Oral   Take 1 tablet by mouth daily.         . metFORMIN (GLUCOPHAGE) 500 MG tablet   Oral   Take 1,000 mg by mouth 2 (two) times daily with a meal.         .  omeprazole (PRILOSEC) 20 MG capsule   Oral   Take 20 mg by mouth daily.         . ondansetron (ZOFRAN ODT) 4 MG disintegrating tablet   Oral   Take 1 tablet (4 mg total) by mouth every 8 (eight) hours as needed for nausea.   12 tablet   0   . oxyCODONE-acetaminophen (PERCOCET/ROXICET) 5-325 MG per tablet   Oral   Take 1 tablet by mouth every 6 (six) hours as needed for pain.   20 tablet   0     BP 138/58  Pulse 114  Temp(Src) 99.7 F (37.6 C) (Oral)  Resp 16  SpO2 94%  Physical Exam  Nursing note and vitals reviewed. Constitutional: He is oriented to person, place, and time. He appears well-developed and well-nourished.  HENT:  Head: Normocephalic.  Cardiovascular: Regular rhythm.  Tachycardia present.   Pulmonary/Chest: Effort normal and breath sounds  normal. No respiratory distress. He has no wheezes. He has no rales.  Abdominal: Soft. There is tenderness in the right upper quadrant. There is guarding and positive Murphy's sign. There is no rebound, no CVA tenderness and no tenderness at McBurney's point.  Musculoskeletal: Normal range of motion.  Neurological: He is alert and oriented to person, place, and time.  Skin: Skin is warm. No rash noted. No erythema.  Psychiatric: He has a normal mood and affect. His behavior is normal.    ED Course  Procedures   Results for orders placed during the hospital encounter of 07/17/12  CBC WITH DIFFERENTIAL      Result Value Range   WBC 9.0  4.0 - 10.5 K/uL   RBC 4.33  4.22 - 5.81 MIL/uL   Hemoglobin 12.4 (*) 13.0 - 17.0 g/dL   HCT 37.5 (*) 39.0 - 52.0 %   MCV 86.6  78.0 - 100.0 fL   MCH 28.6  26.0 - 34.0 pg   MCHC 33.1  30.0 - 36.0 g/dL   RDW 14.2  11.5 - 15.5 %   Platelets 200  150 - 400 K/uL   Neutrophils Relative 84 (*) 43 - 77 %   Neutro Abs 7.6  1.7 - 7.7 K/uL   Lymphocytes Relative 8 (*) 12 - 46 %   Lymphs Abs 0.7  0.7 - 4.0 K/uL   Monocytes Relative 8  3 - 12 %   Monocytes Absolute 0.7  0.1 - 1.0 K/uL   Eosinophils Relative 0  0 - 5 %   Eosinophils Absolute 0.0  0.0 - 0.7 K/uL   Basophils Relative 0  0 - 1 %   Basophils Absolute 0.0  0.0 - 0.1 K/uL  COMPREHENSIVE METABOLIC PANEL      Result Value Range   Sodium 139  135 - 145 mEq/L   Potassium 4.5  3.5 - 5.1 mEq/L   Chloride 102  96 - 112 mEq/L   CO2 27  19 - 32 mEq/L   Glucose, Bld 153 (*) 70 - 99 mg/dL   BUN 20  6 - 23 mg/dL   Creatinine, Ser 1.36 (*) 0.50 - 1.35 mg/dL   Calcium 9.3  8.4 - 10.5 mg/dL   Total Protein 6.8  6.0 - 8.3 g/dL   Albumin 3.9  3.5 - 5.2 g/dL   AST 26  0 - 37 U/L   ALT 20  0 - 53 U/L   Alkaline Phosphatase 68  39 - 117 U/L   Total Bilirubin 0.6  0.3 - 1.2 mg/dL  GFR calc non Af Amer 49 (*) >90 mL/min   GFR calc Af Amer 57 (*) >90 mL/min  LIPASE, BLOOD      Result Value Range   Lipase 16   11 - 59 U/L  LACTIC ACID, PLASMA      Result Value Range   Lactic Acid, Venous 1.4  0.5 - 2.2 mmol/L          Dg Chest 2 View  07/17/2012  *RADIOLOGY REPORT*  Clinical Data: Abdominal pain.  CHEST - 2 VIEW  Comparison: PA and lateral chest 05/13/2010 and 07/25/2008.  Findings: There is some chronic basilar scarring.  Lungs otherwise clear.  Heart size is normal.  No pneumothorax or pleural effusion.  IMPRESSION: No acute finding.  Stable compared to prior exams.   Original Report Authenticated By: [name], M.D.    Ct Abdomen Pelvis W Contrast  07/17/2012  *RADIOLOGY REPORT*  Clinical Data: 78 year old male with abdominal and pelvic pain with nausea/vomiting.  CT ABDOMEN AND PELVIS WITH CONTRAST  Technique:  Multidetector CT imaging of the abdomen and pelvis was performed following the standard protocol during bolus administration of intravenous contrast.  Contrast: 185m OMNIPAQUE IOHEXOL 300 MG/ML  SOLN  Comparison: None  Findings: Emphysema at the lung bases noted.  Small hepatic cysts are present.  The liver is otherwise unremarkable. The spleen, pancreas and adrenal glands are unremarkable.  Mild gallbladder distention is noted with stones / sludge.  No CT evidence of acute cholecystitis identified.  Moderate bilateral renal cortical atrophy and left renal cysts noted.  No free fluid, enlarged lymph nodes, biliary dilation or abdominal aortic aneurysm identified.  Descending and sigmoid colonic diverticulosis noted without evidence of diverticulitis. No other bowel or bladder abnormalities are present. Prostate enlargement is identified.  No acute or suspicious bony abnormalities are identified. Moderate degenerative disc disease within the lumbar spine noted.  IMPRESSION: No evidence of acute abnormality.  Cholelithiasis/gallbladder sludge without CT evidence of acute cholecystitis.  If there is strong clinical concern for cholecystitis, consider ultrasound or nuclear medicine study.   Bilateral renal cortical atrophy.  Colonic diverticulosis without diverticulitis.  Emphysema.   Original Report Authenticated By: JMargarette Canada M.D.      1. Acute cholecystitis   2. Cholelithiases       MDM  9:00 PM patient seen and evaluated. Patient does not appear in any acute distress. Mild tachycardia. He is not appear severely ill or toxic. Continues to have RUQ pain. He does report pain improved from earlier this morning.  Patient also seen and evaluated by attending physician. Suspect possible acute cholecystitis, will obtain ultrasound. We'll also give Invanz.  Spoke with general surgery. They will see patient and admit for acute cholecystitis and cholelithiasis.      PMartie Lee PA-C 07/18/12 [phone]

## 2012-07-17 NOTE — ED Notes (Signed)
Pt was seen here this morning for lower abdominal pain and dx with gallstones- instructed to returned to ED if fever developed.  Pt noticed fever today around noon.  Pt took Percocet for pain at 1130 this morning.  Rectal temperature currently 102.5, pt alert and oriented.  Denies any pain.

## 2012-07-17 NOTE — ED Provider Notes (Addendum)
Medical screening examination/treatment/procedure(s) were conducted as a shared visit with non-physician practitioner(s) and myself.  I personally evaluated the patient during the encounter a   78 year old male with right upper quadrant pain. Patient seen in the emergency room earlier for the same. His workup at that time was fairly unremarkable aside from CT the abdomen pelvis which showed some gallbladder sludge and some mild distention. Imaged portions of lungs w/o findings concerning for infectious process. He was afebrile, had no leukocytosis and normal LFTs. Urinalysis was unremarkable and no evidence of urinary tract calculi. He was given return precautions and appropriately returned to the emergency room because he developped fever and chills. Abdominal pain has persisted. He denies any other new symptoms. On exam patient is well appearing. He is in no acute distress. He does have some tenderness in the right upper quadrant with guarding upon deep palpation. There is no rebound tenderness. He does not seem to be distended. No surgical scars noted. There is concern for possible cholecystitis. Will obtain ultrasound to further evaluate. Anticipate surgical consultation.    Virgel Manifold, MD 07/18/12 [phone]

## 2012-07-17 NOTE — ED Notes (Signed)
Family at bedside. 

## 2012-07-17 NOTE — ED Provider Notes (Signed)
History     CSN: [id number]  Arrival date & time 07/17/12  0418   First MD Initiated Contact with Patient 07/17/12 0458      Chief Complaint  Patient presents with  . Abdominal Pain    (Consider location/radiation/quality/duration/timing/severity/associated sxs/prior treatment) The history is provided by the patient.   78 year old male primary care Dr. is Cathlean Cower, patient with acute onset of right upper quadrant abdominal pain will come from sleep started at 2 in the morning. Never anything like this before was 10 out of 10 radiated to the back. Associated with nausea and vomiting x2. No diarrhea. Pain described as colicky and sharp.  Past Medical History  Diagnosis Date  . ALLERGIC RHINITIS 01/24/2007  . BENIGN PROSTATIC HYPERTROPHY 01/24/2007  . BRONCHITIS, ACUTE 06/11/2008  . Cough 01/24/2007  . DIABETES MELLITUS, TYPE II 01/24/2007  . FATIGUE 07/27/2007  . HYPERLIPIDEMIA 01/24/2007  . HYPERTENSION 10/05/2006  . INSOMNIA 06/21/2008  . LEG PAIN, BILATERAL 10/06/2007  . PERIPHERAL NEUROPATHY, FEET 10/06/2007  . PERS HX TOBACCO USE PRESENTING HAZARDS HEALTH 09/17/2009  . VITAMIN B12 DEFICIENCY 09/06/2008  . VITAMIN D DEFICIENCY 01/16/2010  . Wheezing 01/16/2010    Past Surgical History  Procedure Laterality Date  . Total knee arthroplasty  2001    Left  . Turp vaporization      Family History  Problem Relation Age of Onset  . Lung cancer Brother     History  Substance Use Topics  . Smoking status: Former Research scientist (life sciences)  . Smokeless tobacco: Not on file  . Alcohol Use: No      Review of Systems  Constitutional: Negative for fever.  HENT: Negative for congestion.   Eyes: Negative for redness.  Respiratory: Negative for shortness of breath.   Cardiovascular: Negative for chest pain.  Gastrointestinal: Positive for nausea, vomiting and abdominal pain. Negative for diarrhea.  Genitourinary: Negative for dysuria and hematuria.  Musculoskeletal: Positive for back pain.   Skin: Negative for rash.  Neurological: Negative for headaches.  Hematological: Does not bruise/bleed easily.  Psychiatric/Behavioral: Negative for confusion.    Allergies  Review of patient's allergies indicates no known allergies.  Home Medications   Current Outpatient Rx  Name  Route  Sig  Dispense  Refill  . aspirin 81 MG EC tablet   Oral   Take 81 mg by mouth daily.           Marland Kitchen atorvastatin (LIPITOR) 40 MG tablet   Oral   Take 40 mg by mouth daily.         . Cholecalciferol (VITAMIN D3) 1000 UNITS CAPS   Oral   Take 1 capsule by mouth daily.          Marland Kitchen glimepiride (AMARYL) 1 MG tablet   Oral   Take 0.5 mg by mouth 2 (two) times daily before a meal.          . losartan-hydrochlorothiazide (HYZAAR) 100-25 MG per tablet   Oral   Take 1 tablet by mouth daily.         . metFORMIN (GLUCOPHAGE) 500 MG tablet   Oral   Take 1,000 mg by mouth 2 (two) times daily with a meal.         . omeprazole (PRILOSEC) 20 MG capsule   Oral   Take 20 mg by mouth daily.         . ondansetron (ZOFRAN ODT) 4 MG disintegrating tablet   Oral   Take 1 tablet (4 mg total) by  mouth every 8 (eight) hours as needed for nausea.   12 tablet   0   . oxyCODONE-acetaminophen (PERCOCET/ROXICET) 5-325 MG per tablet   Oral   Take 1 tablet by mouth every 6 (six) hours as needed for pain.   20 tablet   0     BP 149/68  Pulse 65  Temp(Src) 98.1 F (36.7 C) (Oral)  Resp 18  SpO2 93%  Physical Exam  Nursing note and vitals reviewed. Constitutional: He is oriented to person, place, and time. He appears well-developed and well-nourished. He appears distressed.  HENT:  Head: Normocephalic and atraumatic.  Mouth/Throat: Oropharynx is clear and moist.  Eyes: Conjunctivae and EOM are normal. Pupils are equal, round, and reactive to light.  Neck: Normal range of motion. Neck supple.  Cardiovascular: Normal rate, regular rhythm and normal heart sounds.   No murmur  heard. Pulmonary/Chest: Effort normal and breath sounds normal. No respiratory distress.  Abdominal: Soft. Bowel sounds are normal. There is tenderness. There is no guarding.  Musculoskeletal: Normal range of motion. He exhibits no edema.  Neurological: He is alert and oriented to person, place, and time. No cranial nerve deficit. He exhibits normal muscle tone. Coordination normal.  Skin: Skin is warm. No rash noted.    ED Course  Procedures (including critical care time)  Labs Reviewed  COMPREHENSIVE METABOLIC PANEL - Abnormal; Notable for the following:    Glucose, Bld 141 (*)    GFR calc non Af Amer 55 (*)    GFR calc Af Amer 64 (*)    All other components within normal limits  CBC WITH DIFFERENTIAL  LIPASE, BLOOD  AMYLASE  URINALYSIS, ROUTINE W REFLEX MICROSCOPIC  TROPONIN I   Dg Chest 2 View  07/17/2012  *RADIOLOGY REPORT*  Clinical Data: Abdominal pain.  CHEST - 2 VIEW  Comparison: PA and lateral chest 05/13/2010 and 07/25/2008.  Findings: There is some chronic basilar scarring.  Lungs otherwise clear.  Heart size is normal.  No pneumothorax or pleural effusion.  IMPRESSION: No acute finding.  Stable compared to prior exams.   Original Report Authenticated By: [name], M.D.    Ct Abdomen Pelvis W Contrast  07/17/2012  *RADIOLOGY REPORT*  Clinical Data: 78 year old male with abdominal and pelvic pain with nausea/vomiting.  CT ABDOMEN AND PELVIS WITH CONTRAST  Technique:  Multidetector CT imaging of the abdomen and pelvis was performed following the standard protocol during bolus administration of intravenous contrast.  Contrast: 161m OMNIPAQUE IOHEXOL 300 MG/ML  SOLN  Comparison: None  Findings: Emphysema at the lung bases noted.  Small hepatic cysts are present.  The liver is otherwise unremarkable. The spleen, pancreas and adrenal glands are unremarkable.  Mild gallbladder distention is noted with stones / sludge.  No CT evidence of acute cholecystitis identified.   Moderate bilateral renal cortical atrophy and left renal cysts noted.  No free fluid, enlarged lymph nodes, biliary dilation or abdominal aortic aneurysm identified.  Descending and sigmoid colonic diverticulosis noted without evidence of diverticulitis. No other bowel or bladder abnormalities are present. Prostate enlargement is identified.  No acute or suspicious bony abnormalities are identified. Moderate degenerative disc disease within the lumbar spine noted.  IMPRESSION: No evidence of acute abnormality.  Cholelithiasis/gallbladder sludge without CT evidence of acute cholecystitis.  If there is strong clinical concern for cholecystitis, consider ultrasound or nuclear medicine study.  Bilateral renal cortical atrophy.  Colonic diverticulosis without diverticulitis.  Emphysema.   Original Report Authenticated By: JMargarette Canada M.D.  Date: 07/17/2012  Rate: 56  Rhythm: normal sinus rhythm  QRS Axis: normal  Intervals: normal  ST/T Wave abnormalities: nonspecific ST/T changes  Conduction Disutrbances:right bundle branch block and left anterior fascicular block  Narrative Interpretation:   Old EKG Reviewed: unchanged Patient has an EKG done by [facility] no changes compared to the day however that old EKG does not have a date on it. Correction there is a date 07/25/2008 no sniffing change  Results for orders placed during the hospital encounter of 07/17/12  CBC WITH DIFFERENTIAL      Result Value Range   WBC 5.0  4.0 - 10.5 K/uL   RBC 4.66  4.22 - 5.81 MIL/uL   Hemoglobin 13.3  13.0 - 17.0 g/dL   HCT 40.8  39.0 - 52.0 %   MCV 87.6  78.0 - 100.0 fL   MCH 28.5  26.0 - 34.0 pg   MCHC 32.6  30.0 - 36.0 g/dL   RDW 14.2  11.5 - 15.5 %   Platelets 191  150 - 400 K/uL   Neutrophils Relative 53  43 - 77 %   Neutro Abs 2.6  1.7 - 7.7 K/uL   Lymphocytes Relative 32  12 - 46 %   Lymphs Abs 1.6  0.7 - 4.0 K/uL   Monocytes Relative 9  3 - 12 %   Monocytes Absolute 0.5  0.1 - 1.0 K/uL    Eosinophils Relative 5  0 - 5 %   Eosinophils Absolute 0.2  0.0 - 0.7 K/uL   Basophils Relative 1  0 - 1 %   Basophils Absolute 0.0  0.0 - 0.1 K/uL  COMPREHENSIVE METABOLIC PANEL      Result Value Range   Sodium 143  135 - 145 mEq/L   Potassium 4.4  3.5 - 5.1 mEq/L   Chloride 104  96 - 112 mEq/L   CO2 28  19 - 32 mEq/L   Glucose, Bld 141 (*) 70 - 99 mg/dL   BUN 19  6 - 23 mg/dL   Creatinine, Ser 1.23  0.50 - 1.35 mg/dL   Calcium 9.9  8.4 - 10.5 mg/dL   Total Protein 7.2  6.0 - 8.3 g/dL   Albumin 4.3  3.5 - 5.2 g/dL   AST 23  0 - 37 U/L   ALT 15  0 - 53 U/L   Alkaline Phosphatase 109  39 - 117 U/L   Total Bilirubin 0.3  0.3 - 1.2 mg/dL   GFR calc non Af Amer 55 (*) >90 mL/min   GFR calc Af Amer 64 (*) >90 mL/min  LIPASE, BLOOD      Result Value Range   Lipase 37  11 - 59 U/L  AMYLASE      Result Value Range   Amylase 98  0 - 105 U/L  URINALYSIS, ROUTINE W REFLEX MICROSCOPIC      Result Value Range   Color, Urine YELLOW  YELLOW   APPearance CLEAR  CLEAR   Specific Gravity, Urine 1.019  1.005 - 1.030   pH 5.0  5.0 - 8.0   Glucose, UA NEGATIVE  NEGATIVE mg/dL   Hgb urine dipstick NEGATIVE  NEGATIVE   Bilirubin Urine NEGATIVE  NEGATIVE   Ketones, ur NEGATIVE  NEGATIVE mg/dL   Protein, ur NEGATIVE  NEGATIVE mg/dL   Urobilinogen, UA 0.2  0.0 - 1.0 mg/dL   Nitrite NEGATIVE  NEGATIVE   Leukocytes, UA NEGATIVE  NEGATIVE  TROPONIN I  Result Value Range   Troponin I <0.30  <0.30 ng/mL   Dg Chest 2 View  07/17/2012  *RADIOLOGY REPORT*  Clinical Data: Abdominal pain.  CHEST - 2 VIEW  Comparison: PA and lateral chest 05/13/2010 and 07/25/2008.  Findings: There is some chronic basilar scarring.  Lungs otherwise clear.  Heart size is normal.  No pneumothorax or pleural effusion.  IMPRESSION: No acute finding.  Stable compared to prior exams.   Original Report Authenticated By: [name], M.D.    Ct Abdomen Pelvis W Contrast  07/17/2012  *RADIOLOGY REPORT*  Clinical Data:  78 year old male with abdominal and pelvic pain with nausea/vomiting.  CT ABDOMEN AND PELVIS WITH CONTRAST  Technique:  Multidetector CT imaging of the abdomen and pelvis was performed following the standard protocol during bolus administration of intravenous contrast.  Contrast: 173m OMNIPAQUE IOHEXOL 300 MG/ML  SOLN  Comparison: None  Findings: Emphysema at the lung bases noted.  Small hepatic cysts are present.  The liver is otherwise unremarkable. The spleen, pancreas and adrenal glands are unremarkable.  Mild gallbladder distention is noted with stones / sludge.  No CT evidence of acute cholecystitis identified.  Moderate bilateral renal cortical atrophy and left renal cysts noted.  No free fluid, enlarged lymph nodes, biliary dilation or abdominal aortic aneurysm identified.  Descending and sigmoid colonic diverticulosis noted without evidence of diverticulitis. No other bowel or bladder abnormalities are present. Prostate enlargement is identified.  No acute or suspicious bony abnormalities are identified. Moderate degenerative disc disease within the lumbar spine noted.  IMPRESSION: No evidence of acute abnormality.  Cholelithiasis/gallbladder sludge without CT evidence of acute cholecystitis.  If there is strong clinical concern for cholecystitis, consider ultrasound or nuclear medicine study.  Bilateral renal cortical atrophy.  Colonic diverticulosis without diverticulitis.  Emphysema.   Original Report Authenticated By: JMargarette Canada M.D.     Are 10 and a currently the [place] and pain he will be is the he has a little for is a healed and is a heart or and he is and is in the for any may want will and a wound of and for orders when the he's informed as he  1. Biliary colic   2. Cholelithiasis       MDM  CT scan is consistent with a gallstones. There is cholelithiasis and gallbladder sludge without CT evidence of acute cholecystitis. Patient's labs without significant abnormalities no leukocytosis  no bilirubinemia no elevated lipase to suggest pancreatitis. The liver function tests do not imply a retained common bile duct stone. Patient's pain significantly improved here in the emergency department. Patient without significant tenderness in the right upper quadrant no guarding. Will refer to general surgery for elective cholecystectomy. Patient given precautions and will return for recurrent severe abdominal pain fevers or pain not resolving in one hour.        SMervin Kung MD 07/17/12 [phone]

## 2012-07-17 NOTE — ED Notes (Signed)
Pt also states pain in upper right quadrant of abdomen.

## 2012-07-17 NOTE — ED Notes (Signed)
Pt was seen this morning here at [facility] and was told to to come back for fever. At home his temperature was up to 102.5 F

## 2012-07-18 ENCOUNTER — Encounter (HOSPITAL_COMMUNITY): Payer: Self-pay | Admitting: Certified Registered Nurse Anesthetist

## 2012-07-18 ENCOUNTER — Encounter (HOSPITAL_COMMUNITY): Payer: Self-pay | Admitting: *Deleted

## 2012-07-18 ENCOUNTER — Encounter (HOSPITAL_COMMUNITY): Admission: EM | Disposition: A | Discharge status: Home / Self Care | Payer: Self-pay | Source: Home / Self Care

## 2012-07-18 ENCOUNTER — Inpatient Hospital Stay (HOSPITAL_COMMUNITY): Payer: Medicare Other | Admitting: Certified Registered Nurse Anesthetist

## 2012-07-18 ENCOUNTER — Inpatient Hospital Stay (HOSPITAL_COMMUNITY): Payer: Medicare Other

## 2012-07-18 DIAGNOSIS — IMO0001 Reserved for inherently not codable concepts without codable children: Secondary | ICD-10-CM | POA: Diagnosis not present

## 2012-07-18 DIAGNOSIS — E119 Type 2 diabetes mellitus without complications: Secondary | ICD-10-CM | POA: Diagnosis not present

## 2012-07-18 DIAGNOSIS — K8000 Calculus of gallbladder with acute cholecystitis without obstruction: Secondary | ICD-10-CM | POA: Diagnosis not present

## 2012-07-18 DIAGNOSIS — K810 Acute cholecystitis: Secondary | ICD-10-CM | POA: Diagnosis not present

## 2012-07-18 DIAGNOSIS — D62 Acute posthemorrhagic anemia: Secondary | ICD-10-CM | POA: Diagnosis not present

## 2012-07-18 DIAGNOSIS — I10 Essential (primary) hypertension: Secondary | ICD-10-CM | POA: Diagnosis not present

## 2012-07-18 DIAGNOSIS — K8020 Calculus of gallbladder without cholecystitis without obstruction: Secondary | ICD-10-CM | POA: Diagnosis not present

## 2012-07-18 DIAGNOSIS — K8010 Calculus of gallbladder with chronic cholecystitis without obstruction: Secondary | ICD-10-CM | POA: Diagnosis not present

## 2012-07-18 HISTORY — PX: CHOLECYSTECTOMY: SHX55

## 2012-07-18 LAB — CREATININE, SERUM
Creatinine, Ser: 1.53 mg/dL — ABNORMAL HIGH (ref 0.50–1.35)
GFR calc Af Amer: 49 mL/min — ABNORMAL LOW (ref >90)
GFR calc non Af Amer: 42 mL/min — ABNORMAL LOW (ref >90)

## 2012-07-18 LAB — COMPREHENSIVE METABOLIC PANEL
ALT: 18 U/L (ref 0–53)
AST: 21 U/L (ref 0–37)
Albumin: 3.3 g/dL — ABNORMAL LOW (ref 3.5–5.2)
Alkaline Phosphatase: 67 U/L (ref 39–117)
BUN: 22 mg/dL (ref 6–23)
CO2: 26 mEq/L (ref 19–32)
Calcium: 8.9 mg/dL (ref 8.4–10.5)
Chloride: 103 mEq/L (ref 96–112)
Creatinine, Ser: 1.43 mg/dL — ABNORMAL HIGH (ref 0.50–1.35)
GFR calc Af Amer: 53 mL/min — ABNORMAL LOW (ref >90)
GFR calc non Af Amer: 46 mL/min — ABNORMAL LOW (ref >90)
Glucose, Bld: 107 mg/dL — ABNORMAL HIGH (ref 70–99)
Potassium: 3.8 mEq/L (ref 3.5–5.1)
Sodium: 138 mEq/L (ref 135–145)
Total Bilirubin: 0.7 mg/dL (ref 0.3–1.2)
Total Protein: 6.2 g/dL (ref 6.0–8.3)

## 2012-07-18 LAB — HEMOGLOBIN A1C
Hgb A1c MFr Bld: 6.3 % — ABNORMAL HIGH (ref <5.7)
Mean Plasma Glucose: 134 mg/dL — ABNORMAL HIGH (ref <117)

## 2012-07-18 LAB — GLUCOSE, CAPILLARY
Glucose-Capillary: 95 mg/dL (ref 70–99)
Glucose-Capillary: 125 mg/dL — ABNORMAL HIGH (ref 70–99)
Glucose-Capillary: 117 mg/dL — ABNORMAL HIGH (ref 70–99)
Glucose-Capillary: 105 mg/dL — ABNORMAL HIGH (ref 70–99)

## 2012-07-18 LAB — CBC
HCT: 36.9 % — ABNORMAL LOW (ref 39.0–52.0)
HCT: 36.1 % — ABNORMAL LOW (ref 39.0–52.0)
Hemoglobin: 12.1 g/dL — ABNORMAL LOW (ref 13.0–17.0)
Hemoglobin: 11.8 g/dL — ABNORMAL LOW (ref 13.0–17.0)
MCH: 28.7 pg (ref 26.0–34.0)
MCH: 28.6 pg (ref 26.0–34.0)
MCHC: 32.8 g/dL (ref 30.0–36.0)
MCHC: 32.7 g/dL (ref 30.0–36.0)
MCV: 87.6 fL (ref 78.0–100.0)
MCV: 87.4 fL (ref 78.0–100.0)
Platelets: 160 10*3/uL (ref 150–400)
Platelets: 157 10*3/uL (ref 150–400)
RBC: 4.21 MIL/uL — ABNORMAL LOW (ref 4.22–5.81)
RBC: 4.13 MIL/uL — ABNORMAL LOW (ref 4.22–5.81)
RDW: 14.5 % (ref 11.5–15.5)
RDW: 14.5 % (ref 11.5–15.5)
WBC: 7.1 10*3/uL (ref 4.0–10.5)
WBC: 5.8 10*3/uL (ref 4.0–10.5)

## 2012-07-18 LAB — MRSA PCR SCREENING: MRSA by PCR: NEGATIVE

## 2012-07-18 SURGERY — LAPAROSCOPIC CHOLECYSTECTOMY WITH INTRAOPERATIVE CHOLANGIOGRAM
Anesthesia: General | Site: Abdomen | Wound class: Dirty or Infected

## 2012-07-18 MED ORDER — POTASSIUM CHLORIDE IN NACL 20-0.9 MEQ/L-% IV SOLN
INTRAVENOUS | Status: DC
  Administered 2012-07-19 – 2012-07-21 (×5): via INTRAVENOUS
  Filled 2012-07-18 (×9): qty 1000

## 2012-07-18 MED ORDER — LACTATED RINGERS IV SOLN
INTRAVENOUS | Status: DC | PRN
  Administered 2012-07-18: 11:00:00 via INTRAVENOUS

## 2012-07-18 MED ORDER — SODIUM CHLORIDE 0.9 % IR SOLN
Status: DC | PRN
  Administered 2012-07-18 (×2): 1000 mL

## 2012-07-18 MED ORDER — SODIUM CHLORIDE 0.9 % IV SOLN
INTRAVENOUS | Status: DC | PRN
  Administered 2012-07-18: 12:00:00

## 2012-07-18 MED ORDER — DEXTROSE 5 % IV SOLN
1.0000 g | Freq: Once | INTRAVENOUS | Status: AC
  Administered 2012-07-18: 1 g via INTRAVENOUS
  Filled 2012-07-18: qty 10

## 2012-07-18 MED ORDER — ARTIFICIAL TEARS OP OINT
TOPICAL_OINTMENT | OPHTHALMIC | Status: DC | PRN
  Administered 2012-07-18: 1 via OPHTHALMIC

## 2012-07-18 MED ORDER — LOSARTAN POTASSIUM 50 MG PO TABS
100.0000 mg | ORAL_TABLET | Freq: Every day | ORAL | Status: DC
  Administered 2012-07-19 – 2012-07-21 (×3): 100 mg via ORAL
  Filled 2012-07-18 (×5): qty 2

## 2012-07-18 MED ORDER — DIPHENHYDRAMINE HCL 50 MG/ML IJ SOLN: 12.5000 mg | Freq: Four times a day (QID) | INTRAMUSCULAR | Status: DC | PRN

## 2012-07-18 MED ORDER — ONDANSETRON HCL 4 MG/2ML IJ SOLN
INTRAMUSCULAR | Status: DC | PRN
  Administered 2012-07-18: 4 mg via INTRAVENOUS

## 2012-07-18 MED ORDER — DIPHENHYDRAMINE HCL 12.5 MG/5ML PO ELIX: 12.5000 mg | ORAL_SOLUTION | Freq: Four times a day (QID) | ORAL | Status: DC | PRN

## 2012-07-18 MED ORDER — INSULIN ASPART 100 UNIT/ML ~~LOC~~ SOLN
0.0000 [IU] | SUBCUTANEOUS | Status: DC
  Administered 2012-07-19 – 2012-07-20 (×4): 2 [IU] via SUBCUTANEOUS
  Administered 2012-07-20: 3 [IU] via SUBCUTANEOUS
  Administered 2012-07-20: 2 [IU] via SUBCUTANEOUS

## 2012-07-18 MED ORDER — GLYCOPYRROLATE 0.2 MG/ML IJ SOLN
INTRAMUSCULAR | Status: DC | PRN
  Administered 2012-07-18: 0.6 mg via INTRAVENOUS

## 2012-07-18 MED ORDER — NEOSTIGMINE METHYLSULFATE 1 MG/ML IJ SOLN
INTRAMUSCULAR | Status: DC | PRN
  Administered 2012-07-18: 4 mg via INTRAVENOUS

## 2012-07-18 MED ORDER — PANTOPRAZOLE SODIUM 40 MG IV SOLR
40.0000 mg | Freq: Every day | INTRAVENOUS | Status: DC
  Administered 2012-07-18 – 2012-07-20 (×3): 40 mg via INTRAVENOUS
  Filled 2012-07-18 (×6): qty 40

## 2012-07-18 MED ORDER — DEXTROSE 5 % IV SOLN
2.0000 g | INTRAVENOUS | Status: DC
  Administered 2012-07-19 – 2012-07-21 (×3): 2 g via INTRAVENOUS
  Filled 2012-07-18 (×3): qty 2

## 2012-07-18 MED ORDER — ACETAMINOPHEN 10 MG/ML IV SOLN
INTRAVENOUS | Status: AC
  Administered 2012-07-18: 1000 mg via INTRAVENOUS
  Filled 2012-07-18: qty 100

## 2012-07-18 MED ORDER — HYDROCHLOROTHIAZIDE 25 MG PO TABS
25.0000 mg | ORAL_TABLET | Freq: Every day | ORAL | Status: DC
  Administered 2012-07-19 – 2012-07-21 (×3): 25 mg via ORAL
  Filled 2012-07-18 (×5): qty 1

## 2012-07-18 MED ORDER — PHENYLEPHRINE HCL 10 MG/ML IJ SOLN
INTRAMUSCULAR | Status: DC | PRN
  Administered 2012-07-18 (×2): 40 ug via INTRAVENOUS

## 2012-07-18 MED ORDER — MORPHINE SULFATE 2 MG/ML IJ SOLN: 1.0000 mg | INTRAMUSCULAR | Status: DC | PRN

## 2012-07-18 MED ORDER — HEMOSTATIC AGENTS (NO CHARGE) OPTIME
TOPICAL | Status: DC | PRN
  Administered 2012-07-18: 1 via TOPICAL

## 2012-07-18 MED ORDER — LIDOCAINE HCL (CARDIAC) 20 MG/ML IV SOLN
INTRAVENOUS | Status: DC | PRN
  Administered 2012-07-18: 100 mg via INTRAVENOUS

## 2012-07-18 MED ORDER — ONDANSETRON HCL 4 MG/2ML IJ SOLN: 4.0000 mg | Freq: Once | INTRAMUSCULAR | Status: DC | PRN

## 2012-07-18 MED ORDER — PROPOFOL 10 MG/ML IV BOLUS
INTRAVENOUS | Status: DC | PRN
  Administered 2012-07-18: 200 mg via INTRAVENOUS

## 2012-07-18 MED ORDER — ROCURONIUM BROMIDE 100 MG/10ML IV SOLN
INTRAVENOUS | Status: DC | PRN
  Administered 2012-07-18: 15 mg via INTRAVENOUS
  Administered 2012-07-18: 5 mg via INTRAVENOUS
  Administered 2012-07-18: 30 mg via INTRAVENOUS
  Administered 2012-07-18: 5 mg via INTRAVENOUS

## 2012-07-18 MED ORDER — BUPIVACAINE-EPINEPHRINE PF 0.25-1:200000 % IJ SOLN
INTRAMUSCULAR | Status: AC
  Filled 2012-07-18: qty 30

## 2012-07-18 MED ORDER — FENTANYL CITRATE 0.05 MG/ML IJ SOLN
INTRAMUSCULAR | Status: DC | PRN
  Administered 2012-07-18 (×2): 100 ug via INTRAVENOUS
  Administered 2012-07-18: 50 ug via INTRAVENOUS

## 2012-07-18 MED ORDER — ONDANSETRON HCL 4 MG/2ML IJ SOLN
4.0000 mg | Freq: Four times a day (QID) | INTRAMUSCULAR | Status: DC | PRN
  Administered 2012-07-19: 4 mg via INTRAVENOUS
  Filled 2012-07-18: qty 2

## 2012-07-18 MED ORDER — POTASSIUM CHLORIDE IN NACL 20-0.45 MEQ/L-% IV SOLN
INTRAVENOUS | Status: DC
  Administered 2012-07-18: 06:00:00 via INTRAVENOUS
  Filled 2012-07-18 (×3): qty 1000

## 2012-07-18 MED ORDER — BUPIVACAINE-EPINEPHRINE 0.25% -1:200000 IJ SOLN
INTRAMUSCULAR | Status: DC | PRN
  Administered 2012-07-18: 15 mL

## 2012-07-18 MED ORDER — HYDROMORPHONE HCL PF 1 MG/ML IJ SOLN: 0.2500 mg | INTRAMUSCULAR | Status: DC | PRN

## 2012-07-18 MED ORDER — GLIMEPIRIDE 1 MG PO TABS
0.5000 mg | ORAL_TABLET | Freq: Two times a day (BID) | ORAL | Status: DC
  Administered 2012-07-18 – 2012-07-19 (×2): 0.5 mg via ORAL
  Administered 2012-07-19: 09:00:00 via ORAL
  Administered 2012-07-20 – 2012-07-22 (×5): 0.5 mg via ORAL
  Filled 2012-07-18 (×11): qty 0.5

## 2012-07-18 MED ORDER — VECURONIUM BROMIDE 10 MG IV SOLR
INTRAVENOUS | Status: DC | PRN
  Administered 2012-07-18: 2 mg via INTRAVENOUS

## 2012-07-18 MED ORDER — HYDROCODONE-ACETAMINOPHEN 5-325 MG PO TABS
1.0000 | ORAL_TABLET | ORAL | Status: DC | PRN
  Administered 2012-07-18 – 2012-07-21 (×6): 2 via ORAL
  Administered 2012-07-21: 1 via ORAL
  Filled 2012-07-18 (×8): qty 2

## 2012-07-18 MED ORDER — LOSARTAN POTASSIUM-HCTZ 100-25 MG PO TABS: 1.0000 | ORAL_TABLET | Freq: Every day | ORAL | Status: DC

## 2012-07-18 MED ORDER — ENOXAPARIN SODIUM 40 MG/0.4ML ~~LOC~~ SOLN
40.0000 mg | SUBCUTANEOUS | Status: DC
  Administered 2012-07-19 – 2012-07-21 (×3): 40 mg via SUBCUTANEOUS
  Filled 2012-07-18 (×4): qty 0.4

## 2012-07-18 MED ORDER — 0.9 % SODIUM CHLORIDE (POUR BTL) OPTIME
TOPICAL | Status: DC | PRN
  Administered 2012-07-18: 1000 mL

## 2012-07-18 SURGICAL SUPPLY — 53 items
APPLIER CLIP 5 13 M/L LIGAMAX5 (MISCELLANEOUS) ×2
APPLIER CLIP ROT 10 11.4 M/L (STAPLE)
BLADE SURG ROTATE 9660 (MISCELLANEOUS) IMPLANT
CANISTER SUCTION 2500CC (MISCELLANEOUS) ×2 IMPLANT
CHLORAPREP W/TINT 26ML (MISCELLANEOUS) ×2 IMPLANT
CLIP APPLIE 5 13 M/L LIGAMAX5 (MISCELLANEOUS) ×1 IMPLANT
CLIP APPLIE ROT 10 11.4 M/L (STAPLE) IMPLANT
CLOTH BEACON ORANGE TIMEOUT ST (SAFETY) ×2 IMPLANT
COVER MAYO STAND STRL (DRAPES) ×2 IMPLANT
COVER SURGICAL LIGHT HANDLE (MISCELLANEOUS) ×2 IMPLANT
DECANTER SPIKE VIAL GLASS SM (MISCELLANEOUS) ×4 IMPLANT
DERMABOND ADVANCED (GAUZE/BANDAGES/DRESSINGS) ×1
DERMABOND ADVANCED .7 DNX12 (GAUZE/BANDAGES/DRESSINGS) ×1 IMPLANT
DRAPE C-ARM 42X72 X-RAY (DRAPES) ×2 IMPLANT
DRAPE UTILITY 15X26 W/TAPE STR (DRAPE) ×4 IMPLANT
ELECT REM PT RETURN 9FT ADLT (ELECTROSURGICAL) ×2
ELECTRODE REM PT RTRN 9FT ADLT (ELECTROSURGICAL) ×1 IMPLANT
FILTER SMOKE EVAC LAPAROSHD (FILTER) IMPLANT
GLOVE BIO SURGEON STRL SZ 6 (GLOVE) ×2 IMPLANT
GLOVE BIO SURGEON STRL SZ 6.5 (GLOVE) ×2 IMPLANT
GLOVE BIO SURGEON STRL SZ7 (GLOVE) ×2 IMPLANT
GLOVE BIO SURGEON STRL SZ8 (GLOVE) ×2 IMPLANT
GLOVE BIOGEL PI IND STRL 6 (GLOVE) ×1 IMPLANT
GLOVE BIOGEL PI IND STRL 7.0 (GLOVE) ×2 IMPLANT
GLOVE BIOGEL PI IND STRL 7.5 (GLOVE) ×1 IMPLANT
GLOVE BIOGEL PI IND STRL 8 (GLOVE) ×1 IMPLANT
GLOVE BIOGEL PI INDICATOR 6 (GLOVE) ×1
GLOVE BIOGEL PI INDICATOR 7.0 (GLOVE) ×2
GLOVE BIOGEL PI INDICATOR 7.5 (GLOVE) ×1
GLOVE BIOGEL PI INDICATOR 8 (GLOVE) ×1
GLOVE ECLIPSE 7.5 STRL STRAW (GLOVE) ×4 IMPLANT
GOWN PREVENTION PLUS XLARGE (GOWN DISPOSABLE) ×2 IMPLANT
GOWN STRL NON-REIN LRG LVL3 (GOWN DISPOSABLE) ×6 IMPLANT
HEMOSTAT SNOW SURGICEL 2X4 (HEMOSTASIS) ×2 IMPLANT
KIT BASIN OR (CUSTOM PROCEDURE TRAY) ×2 IMPLANT
KIT ROOM TURNOVER OR (KITS) ×2 IMPLANT
NS IRRIG 1000ML POUR BTL (IV SOLUTION) ×2 IMPLANT
PAD ARMBOARD 7.5X6 YLW CONV (MISCELLANEOUS) ×2 IMPLANT
POUCH SPECIMEN RETRIEVAL 10MM (ENDOMECHANICALS) ×2 IMPLANT
SCISSORS LAP 5X35 DISP (ENDOMECHANICALS) IMPLANT
SET CHOLANGIOGRAPH 5 50 .035 (SET/KITS/TRAYS/PACK) ×2 IMPLANT
SET IRRIG TUBING LAPAROSCOPIC (IRRIGATION / IRRIGATOR) ×2 IMPLANT
SLEEVE ENDOPATH XCEL 5M (ENDOMECHANICALS) ×4 IMPLANT
SPECIMEN JAR SMALL (MISCELLANEOUS) ×2 IMPLANT
SUT MNCRL AB 4-0 PS2 18 (SUTURE) ×4 IMPLANT
SUT VIC AB 4-0 PS2 27 (SUTURE) ×2 IMPLANT
SUT VICRYL 0 UR6 27IN ABS (SUTURE) ×4 IMPLANT
TOWEL OR 17X24 6PK STRL BLUE (TOWEL DISPOSABLE) ×2 IMPLANT
TOWEL OR 17X26 10 PK STRL BLUE (TOWEL DISPOSABLE) ×2 IMPLANT
TRAY LAPAROSCOPIC (CUSTOM PROCEDURE TRAY) ×2 IMPLANT
TROCAR XCEL BLUNT TIP 100MML (ENDOMECHANICALS) ×2 IMPLANT
TROCAR XCEL NON-BLD 5MMX100MML (ENDOMECHANICALS) ×2 IMPLANT
WATER STERILE IRR 1000ML POUR (IV SOLUTION) IMPLANT

## 2012-07-18 NOTE — Anesthesia Preprocedure Evaluation (Signed)
Anesthesia Evaluation  Patient identified by MRN, date of birth, ID band Patient awake    Reviewed: Allergy & Precautions, H&P , NPO status , Patient's Chart, lab work & pertinent test results  Airway       Dental   Pulmonary former smoker,          Cardiovascular hypertension,     Neuro/Psych  Neuromuscular disease    GI/Hepatic GERD-  ,  Endo/Other  diabetes, Type 2, Oral Hypoglycemic Agents  Renal/GU      Musculoskeletal   Abdominal   Peds  Hematology   Anesthesia Other Findings   Reproductive/Obstetrics                           Anesthesia Physical Anesthesia Plan  ASA: III  Anesthesia Plan: General   Post-op Pain Management:    Induction: Intravenous  Airway Management Planned: Oral ETT  Additional Equipment:   Intra-op Plan:   Post-operative Plan: Extubation in OR  Informed Consent: I have reviewed the patients History and Physical, chart, labs and discussed the procedure including the risks, benefits and alternatives for the proposed anesthesia with the patient or authorized representative who has indicated his/her understanding and acceptance.     Plan Discussed with: CRNA, Anesthesiologist and Surgeon  Anesthesia Plan Comments:         Anesthesia Quick Evaluation

## 2012-07-18 NOTE — Progress Notes (Signed)
Subjective: RUQ pain persists  Objective: Vital signs in last 24 hours: Temp:  [99 F (37.2 C)-102.5 F (39.2 C)] 100.6 F (38.1 C) (05/12 0551) Pulse Rate:  [66-114] 90 (05/12 0551) Resp:  [16-19] 19 (05/12 0551) BP: (103-138)/(41-63) 103/63 mmHg (05/12 0551) SpO2:  [89 %-99 %] 94 % (05/12 0551) Last BM Date: 07/16/12  Intake/Output from previous day: 05/11 0701 - 05/12 0700 In: 500 [I.V.:500] Out: -  Intake/Output this shift:    General appearance: alert and cooperative Resp: clear to auscultation bilaterally Cardio: S1, S2 normal GI: soft, tender RUQ with voluntary guarding, hypoactive BS Extremities: BLE PAS Neurologic: Mental status: Alert, oriented, thought content appropriate  Lab Results:   Recent Labs  07/17/12 2119 07/18/12 0645  WBC 9.0 7.1  HGB 12.4* 12.1*  HCT 37.5* 36.9*  PLT 200 160   BMET  Recent Labs  07/17/12 0443 07/17/12 2119  NA 143 139  K 4.4 4.5  CL 104 102  CO2 28 27  GLUCOSE 141* 153*  BUN 19 20  CREATININE 1.23 1.36*  CALCIUM 9.9 9.3   PT/INR No results found for this basename: LABPROT, INR,  in the last 72 hours ABG No results found for this basename: PHART, PCO2, PO2, HCO3,  in the last 72 hours  Studies/Results: Dg Chest 2 View  07/17/2012  *RADIOLOGY REPORT*  Clinical Data: Abdominal pain.  CHEST - 2 VIEW  Comparison: PA and lateral chest 05/13/2010 and 07/25/2008.  Findings: There is some chronic basilar scarring.  Lungs otherwise clear.  Heart size is normal.  No pneumothorax or pleural effusion.  IMPRESSION: No acute finding.  Stable compared to prior exams.   Original Report Authenticated By: [name], M.D.    US Abdomen Complete  07/18/2012  *RADIOLOGY REPORT*  Clinical Data:  Right upper quadrant abdominal pain  COMPLETE ABDOMINAL ULTRASOUND  Comparison:  07/17/2012 CT  Findings:  Gallbladder:  Gallstones.  Marked gallbladder wall thickening at 10 mm. Small amount of perihepatic/pericholecystic fluid.   Common bile duct:  Prominent at 7 mm within the proximal and mid duct.  The distal duct is obscured.  Liver:  No focal lesion identified.  Upper normal limits to mildly increased in parenchymal echogenicity.  IVC:  Appears normal.  Pancreas:  Unable to visualize the head/neck/body/or tail due to limited acoustic windows/bowel gas artifact.  Spleen:  Measures 6 cm oblique.  No focal abnormality.  Right Kidney:  Measures 10 cm.  No hydronephrosis or focal abnormality.  Left Kidney:  Measures 12 cm.  No hydronephrosis. Exophytic 2 cm hypodensity appears nearly anechoic.  Abdominal aorta:  No aneurysm identified. Portions of the aorta are suboptimally visualized however normal on recent CT.  IMPRESSION: Distended gallbladder with gallstones and marked gallbladder wall edema. May reflect acute cholecystitis in the appropriate clinical setting.  Mild CBD prominence within the proximal and mid duct, the distal duct is obscured.  Correlate with LFTs has choledocholithiasis is not excluded.  Increased hepatic echogenicity may reflect steatosis.   Original Report Authenticated By: Carlos Levering, M.D.    Ct Abdomen Pelvis W Contrast  07/17/2012  *RADIOLOGY REPORT*  Clinical Data: 78 year old male with abdominal and pelvic pain with nausea/vomiting.  CT ABDOMEN AND PELVIS WITH CONTRAST  Technique:  Multidetector CT imaging of the abdomen and pelvis was performed following the standard protocol during bolus administration of intravenous contrast.  Contrast: 171m OMNIPAQUE IOHEXOL 300 MG/ML  SOLN  Comparison: None  Findings: Emphysema at the lung bases noted.  Small hepatic  cysts are present.  The liver is otherwise unremarkable. The spleen, pancreas and adrenal glands are unremarkable.  Mild gallbladder distention is noted with stones / sludge.  No CT evidence of acute cholecystitis identified.  Moderate bilateral renal cortical atrophy and left renal cysts noted.  No free fluid, enlarged lymph nodes, biliary dilation or  abdominal aortic aneurysm identified.  Descending and sigmoid colonic diverticulosis noted without evidence of diverticulitis. No other bowel or bladder abnormalities are present. Prostate enlargement is identified.  No acute or suspicious bony abnormalities are identified. Moderate degenerative disc disease within the lumbar spine noted.  IMPRESSION: No evidence of acute abnormality.  Cholelithiasis/gallbladder sludge without CT evidence of acute cholecystitis.  If there is strong clinical concern for cholecystitis, consider ultrasound or nuclear medicine study.  Bilateral renal cortical atrophy.  Colonic diverticulosis without diverticulitis.  Emphysema.   Original Report Authenticated By: Margarette Canada, M.D.     Anti-infectives: Anti-infectives   Start     Dose/Rate Route Frequency Ordered Stop   07/17/12 2245  ertapenem (INVANZ) 1 g in sodium chloride 0.9 % 50 mL IVPB     1 g 100 mL/hr over 30 Minutes Intravenous  Once 07/17/12 2232 07/18/12 0236      Assessment/Plan: s/p Procedure(s): LAPAROSCOPIC CHOLECYSTECTOMY WITH INTRAOPERATIVE CHOLANGIOGRAM (N/A) Acute cholecystitis - for lap chole IOC today. I discussed the procedure in detail. We discussed the risks and benefits of a laparoscopic cholecystectomy and possible cholangiogram including, but not limited to bleeding, infection, injury to surrounding structures such as the intestine or liver, bile leak, retained gallstones, need to convert to an open procedure, prolonged diarrhea, blood clots such as  DVT, common bile duct injury, anesthesia risks, and possible need for additional procedures.  The likelihood of improvement in symptoms and return to the patient's normal status is good. We discussed the typical post-operative recovery course. He is agreeable.  I also spoke to his daughter at the bedside.   LOS: 1 day    Raiya Stainback E 07/18/2012

## 2012-07-18 NOTE — H&P (Signed)
Nicholas Patton is an 78 y.o. male.   Chief Complaint: abd pain HPI: 78 yo AAM awoke Sunday morning around 0200 with rRUQ/rt sided abd pain followed by N/v. No prior symptoms. The pain was constant. He presented to [facility] emergency room where he was evaluated with labs and a CT scan. His labs were normal and his CT scan demonstrated cholelithiasis without evidence of cholecystitis. His pain had resolved and he was discharged home with plans to followup at our office.  Later in the afternoon the patient developed a fever of around 102. His abdominal pain had returned. His family checked his temperature several times and it was remaining above 101 and he was brought back to the emergency room. Repeat labs are still normal. He underwent an ultrasound which showed a dilated gallbladder wall as well as some pericholecystic fluid. We were asked to evaluate the patient for possible admission.  Patient denies any frequent NSAIDs use. He denies any melena, hematochezia, acholic stools. He states that thinking back he may have had some mild episodes of abdominal pain after meals off and on for the past several months. He denies any weight loss. He denies any difficulty swallowing.  Past Medical History  Diagnosis Date  . ALLERGIC RHINITIS 01/24/2007  . BENIGN PROSTATIC HYPERTROPHY 01/24/2007  . BRONCHITIS, ACUTE 06/11/2008  . Cough 01/24/2007  . DIABETES MELLITUS, TYPE II 01/24/2007  . FATIGUE 07/27/2007  . HYPERLIPIDEMIA 01/24/2007  . HYPERTENSION 10/05/2006  . INSOMNIA 06/21/2008  . LEG PAIN, BILATERAL 10/06/2007  . PERIPHERAL NEUROPATHY, FEET 10/06/2007  . PERS HX TOBACCO USE PRESENTING HAZARDS HEALTH 09/17/2009  . VITAMIN B12 DEFICIENCY 09/06/2008  . VITAMIN D DEFICIENCY 01/16/2010  . Wheezing 01/16/2010    Past Surgical History  Procedure Laterality Date  . Total knee arthroplasty  2001    Left  . Turp vaporization      Family History  Problem Relation Age of Onset  . Lung cancer Brother     Social History:  reports that he has quit smoking. He does not have any smokeless tobacco history on file. He reports that he does not drink alcohol or use illicit drugs.  Allergies: No Known Allergies   (Not in a hospital admission)  Results for orders placed during the hospital encounter of 07/17/12 (from the past 48 hour(s))  CBC WITH DIFFERENTIAL     Status: Abnormal   Collection Time    07/17/12  9:19 PM      Result Value Range   WBC 9.0  4.0 - 10.5 K/uL   RBC 4.33  4.22 - 5.81 MIL/uL   Hemoglobin 12.4 (*) 13.0 - 17.0 g/dL   HCT 37.5 (*) 39.0 - 52.0 %   MCV 86.6  78.0 - 100.0 fL   MCH 28.6  26.0 - 34.0 pg   MCHC 33.1  30.0 - 36.0 g/dL   RDW 14.2  11.5 - 15.5 %   Platelets 200  150 - 400 K/uL   Neutrophils Relative 84 (*) 43 - 77 %   Neutro Abs 7.6  1.7 - 7.7 K/uL   Lymphocytes Relative 8 (*) 12 - 46 %   Lymphs Abs 0.7  0.7 - 4.0 K/uL   Monocytes Relative 8  3 - 12 %   Monocytes Absolute 0.7  0.1 - 1.0 K/uL   Eosinophils Relative 0  0 - 5 %   Eosinophils Absolute 0.0  0.0 - 0.7 K/uL   Basophils Relative 0  0 - 1 %  Basophils Absolute 0.0  0.0 - 0.1 K/uL  COMPREHENSIVE METABOLIC PANEL     Status: Abnormal   Collection Time    07/17/12  9:19 PM      Result Value Range   Sodium 139  135 - 145 mEq/L   Potassium 4.5  3.5 - 5.1 mEq/L   Comment: HEMOLYSIS AT THIS LEVEL MAY AFFECT RESULT   Chloride 102  96 - 112 mEq/L   CO2 27  19 - 32 mEq/L   Glucose, Bld 153 (*) 70 - 99 mg/dL   BUN 20  6 - 23 mg/dL   Creatinine, Ser 1.36 (*) 0.50 - 1.35 mg/dL   Calcium 9.3  8.4 - 10.5 mg/dL   Total Protein 6.8  6.0 - 8.3 g/dL   Albumin 3.9  3.5 - 5.2 g/dL   AST 26  0 - 37 U/L   ALT 20  0 - 53 U/L   Alkaline Phosphatase 68  39 - 117 U/L   Total Bilirubin 0.6  0.3 - 1.2 mg/dL   GFR calc non Af Amer 49 (*) >90 mL/min   GFR calc Af Amer 57 (*) >90 mL/min   Comment:            The eGFR has been calculated     using the CKD EPI equation.     This calculation has not been      validated in all clinical     situations.     eGFR's persistently     <90 mL/min signify     possible Chronic Kidney Disease.  LIPASE, BLOOD     Status: None   Collection Time    07/17/12  9:19 PM      Result Value Range   Lipase 16  11 - 59 U/L  LACTIC ACID, PLASMA     Status: None   Collection Time    07/17/12 10:38 PM      Result Value Range   Lactic Acid, Venous 1.4  0.5 - 2.2 mmol/L   Dg Chest 2 View  07/17/2012  *RADIOLOGY REPORT*  Clinical Data: Abdominal pain.  CHEST - 2 VIEW  Comparison: PA and lateral chest 05/13/2010 and 07/25/2008.  Findings: There is some chronic basilar scarring.  Lungs otherwise clear.  Heart size is normal.  No pneumothorax or pleural effusion.  IMPRESSION: No acute finding.  Stable compared to prior exams.   Original Report Authenticated By: [name], M.D.    US Abdomen Complete  07/18/2012  *RADIOLOGY REPORT*  Clinical Data:  Right upper quadrant abdominal pain  COMPLETE ABDOMINAL ULTRASOUND  Comparison:  07/17/2012 CT  Findings:  Gallbladder:  Gallstones.  Marked gallbladder wall thickening at 10 mm. Small amount of perihepatic/pericholecystic fluid.  Common bile duct:  Prominent at 7 mm within the proximal and mid duct.  The distal duct is obscured.  Liver:  No focal lesion identified.  Upper normal limits to mildly increased in parenchymal echogenicity.  IVC:  Appears normal.  Pancreas:  Unable to visualize the head/neck/body/or tail due to limited acoustic windows/bowel gas artifact.  Spleen:  Measures 6 cm oblique.  No focal abnormality.  Right Kidney:  Measures 10 cm.  No hydronephrosis or focal abnormality.  Left Kidney:  Measures 12 cm.  No hydronephrosis. Exophytic 2 cm hypodensity appears nearly anechoic.  Abdominal aorta:  No aneurysm identified. Portions of the aorta are suboptimally visualized however normal on recent CT.  IMPRESSION: Distended gallbladder with gallstones and marked gallbladder wall edema. May reflect  acute cholecystitis in  the appropriate clinical setting.  Mild CBD prominence within the proximal and mid duct, the distal duct is obscured.  Correlate with LFTs has choledocholithiasis is not excluded.  Increased hepatic echogenicity may reflect steatosis.   Original Report Authenticated By: Carlos Levering, M.D.    Ct Abdomen Pelvis W Contrast  07/17/2012  *RADIOLOGY REPORT*  Clinical Data: 78 year old male with abdominal and pelvic pain with nausea/vomiting.  CT ABDOMEN AND PELVIS WITH CONTRAST  Technique:  Multidetector CT imaging of the abdomen and pelvis was performed following the standard protocol during bolus administration of intravenous contrast.  Contrast: 123m OMNIPAQUE IOHEXOL 300 MG/ML  SOLN  Comparison: None  Findings: Emphysema at the lung bases noted.  Small hepatic cysts are present.  The liver is otherwise unremarkable. The spleen, pancreas and adrenal glands are unremarkable.  Mild gallbladder distention is noted with stones / sludge.  No CT evidence of acute cholecystitis identified.  Moderate bilateral renal cortical atrophy and left renal cysts noted.  No free fluid, enlarged lymph nodes, biliary dilation or abdominal aortic aneurysm identified.  Descending and sigmoid colonic diverticulosis noted without evidence of diverticulitis. No other bowel or bladder abnormalities are present. Prostate enlargement is identified.  No acute or suspicious bony abnormalities are identified. Moderate degenerative disc disease within the lumbar spine noted.  IMPRESSION: No evidence of acute abnormality.  Cholelithiasis/gallbladder sludge without CT evidence of acute cholecystitis.  If there is strong clinical concern for cholecystitis, consider ultrasound or nuclear medicine study.  Bilateral renal cortical atrophy.  Colonic diverticulosis without diverticulitis.  Emphysema.   Original Report Authenticated By: JMargarette Canada M.D.     Review of Systems  Constitutional: Positive for fever. Negative for chills, weight loss,  malaise/fatigue and diaphoresis.  HENT: Negative for hearing loss and nosebleeds.   Eyes: Negative for double vision.  Respiratory: Negative for shortness of breath.   Cardiovascular: Negative for chest pain, palpitations, orthopnea, leg swelling and PND.       Denies DOE  Gastrointestinal: Positive for nausea, vomiting, abdominal pain and constipation. Negative for blood in stool and melena.  Genitourinary: Negative for dysuria and urgency.  Musculoskeletal: Negative for myalgias.  Neurological: Negative for dizziness, focal weakness, seizures and loss of consciousness.       Denies TIA, amaurosis fugax  Psychiatric/Behavioral: Negative for substance abuse.    Blood pressure 107/56, pulse 69, temperature 101.3 F (38.5 C), temperature source Rectal, resp. rate 18, SpO2 99.00%. Physical Exam  Vitals reviewed. Constitutional: He is oriented to person, place, and time. He appears well-developed and well-nourished. No distress.  HENT:  Head: Normocephalic and atraumatic.  Right Ear: External ear normal.  Left Ear: External ear normal.  Eyes: No scleral icterus.  Muddy sclera  Neck: Normal range of motion. No tracheal deviation present. No thyromegaly present.  Cardiovascular: Normal rate, regular rhythm, normal heart sounds and intact distal pulses.   Respiratory: Breath sounds normal. No respiratory distress. He has no wheezes.  GI: Soft. He exhibits no distension. There is tenderness in the right upper quadrant. There is positive Murphy's sign. There is no rigidity, no rebound and no guarding.  Musculoskeletal: Normal range of motion. He exhibits no edema and no tenderness.  Lymphadenopathy:    He has no cervical adenopathy.  Neurological: He is alert and oriented to person, place, and time.  Skin: Skin is warm and dry. No rash noted. He is not diaphoretic. No erythema.  Psychiatric: He has a normal mood and affect. His behavior  is normal. Judgment and thought content normal.      Assessment/Plan Acute cholecystitis HTN HPL DM2  Admit N.p.o. IV fluids IV antibiotics-the patient just received Invanz. Plan tentative laparoscopic cholecystectomy with cholangiogram possible open in the morning Repeat labs in the a.m. scds SSI  Leighton Ruff. Redmond Pulling, MD, FACS General, Bariatric, & Minimally Invasive Surgery [facility], [place]  [facility] M 07/18/2012, 1:21 AM

## 2012-07-18 NOTE — ED Notes (Signed)
Pt admitted to 6N, transported via stretcher by [employer] EMT

## 2012-07-18 NOTE — Anesthesia Postprocedure Evaluation (Signed)
  Anesthesia Post-op Note  Patient: Nicholas Patton  Procedure(s) Performed: Procedure(s): LAPAROSCOPIC CHOLECYSTECTOMY WITH INTRAOPERATIVE CHOLANGIOGRAM (N/A)  Patient Location: PACU  Anesthesia Type:General  Level of Consciousness: awake, alert , oriented and patient cooperative  Airway and Oxygen Therapy: Patient Spontanous Breathing  Post-op Pain: mild  Post-op Assessment: Post-op Vital signs reviewed, Patient's Cardiovascular Status Stable, Respiratory Function Stable, Patent Airway, No signs of Nausea or vomiting and Pain level controlled  Post-op Vital Signs: stable  Complications: No apparent anesthesia complications

## 2012-07-18 NOTE — Transfer of Care (Signed)
Immediate Anesthesia Transfer of Care Note  Patient: Nicholas Patton  Procedure(s) Performed: Procedure(s): LAPAROSCOPIC CHOLECYSTECTOMY WITH INTRAOPERATIVE CHOLANGIOGRAM (N/A)  Patient Location: PACU  Anesthesia Type:General  Level of Consciousness: awake, alert , oriented and patient cooperative  Airway & Oxygen Therapy: Patient Spontanous Breathing and Patient connected to nasal cannula oxygen  Post-op Assessment: Report given to PACU RN, Post -op Vital signs reviewed and stable and Patient moving all extremities X 4  Post vital signs: Reviewed and stable  Complications: No apparent anesthesia complications

## 2012-07-18 NOTE — Op Note (Signed)
07/17/2012 - 07/18/2012  12:54 PM  PATIENT:  Nicholas Patton  78 y.o. male  PRE-OPERATIVE DIAGNOSIS:  cholecystitis  POST-OPERATIVE DIAGNOSIS:  cholecystitis  PROCEDURE:  Procedure(s): LAPAROSCOPIC CHOLECYSTECTOMY WITH INTRAOPERATIVE CHOLANGIOGRAM  SURGEON:  Surgeon(s): Zenovia Jarred, MD  PHYSICIAN ASSISTANT:   ASSISTANTS: Coralie Keens, PAC, Lurena Joiner, PAS   ANESTHESIA:   local and general  EBL:     BLOOD ADMINISTERED:none  DRAINS: none   SPECIMEN:  Excision  DISPOSITION OF SPECIMEN:  PATHOLOGY  COUNTS:  YES  DICTATION: Viviann Spare Dictation  Patient has acute cholecystitis. He is brought for urgent cholecystectomy. Informed consent was pain. He was identified in the holding area. He is receiving intravenous antibiotics. He was brought to the operating room. General endotracheal anesthesia was administered by the anesthesia staff. Abdomen was prepped and draped in sterile fashion. We did time out procedure. Infraumbilical region was infiltrated with quarter percent Marcaine with epinephrine. Infraumbilical incision was made. Subcutaneous tissues were dissected down revealing the anterior fascia. This was divided sharply along the midline. Peritoneal cavity was entered under direct vision. 0 Vicryl pursestring suture was placed around the fascial opening. Hassan trocar was inserted. Abdomen was insufflated with carbon dioxide in standard fashion. Under direct vision, a 5 mm epigastric and 5 mm lateral ports x2 were placed. Local was used at each port site. Laparoscopic exploration revealed an acutely inflamed gallbladder. There was some purulent fluid along the right gutter. The appendix was located and appeared normal. I suspect this was exudative inflammatory fluid due to his severe acute cholecystitis. Dome of the gallbladder was retracted superior medially. Filmy omental adhesions were gradually taken down off of the gallbladder. Infundibulum was revealed. This was retracted  inferolaterally. Dissection began laterally and progressed medially identifying the cystic duct. There was a lot of edema due to acute inflammation. Cystic duct was dissected until a critical view between the cystic duct the liver and the gallbladder. Clip was placed on the infundibular cystic duct junction. A small nick was made in the cystic duct. Cholangiogram catheter was inserted. Intraoperative cholangiogram revealed no common bile duct filling defects and good flow of contrast into the duodenum. Cystic duct was clipped 3 times proximally after removing the cholangiogram catheter. It was divided. Further dissection revealed the cystic artery. This was clipped twice proximally once distally and divided. Gallbladder was taken off the liver bed achieving excellent hemostasis along the way. There was a small vessel or duct midway up the liver bed which was cauterized and clipped x1. Gallbladder was taken the rest away off the liver bed achieving excellent hemostasis. It was placed in an Endo Catch bag and removed from the abdomen via the infraumbilical port site. Liver bed was copiously irrigated. Cautery was used on a couple areas to get excellent hemostasis. Clips all remain in excellent position. There was no further bleeding. Abdomen soft in the right lower quadrant was then copiously irrigated with saline. Irrigation fluid returned clear. We recheck liver bed. It was dry. Clips were in good position. Surgicel snow was placed in the liver bed. Remainder irrigation fluid was aspirated and it was clear. Ports were removed under direct vision. Pneumoperitoneum was released. Infraumbilical fascia was closed with figure-of-eight 0 Vicryls x3 because previous pursestring came loose. All wounds were copiously irrigated and the skin of each was closed with running 4 Vicryl followed by Dermabond. All counts were correct. Patient tolerated procedure well without apparent complication and was taken recovery in stable  condition.   PATIENT DISPOSITION:  PACU - hemodynamically stable.   Delay start of Pharmacological VTE agent (>24hrs) due to surgical blood loss or risk of bleeding:  no  Georganna Skeans, MD, MPH, FACS Pager: [phone]  5/12/201412:54 PM

## 2012-07-18 NOTE — Preoperative (Signed)
Beta Blockers   Reason not to administer Beta Blockers:Not Applicable 

## 2012-07-19 ENCOUNTER — Encounter (HOSPITAL_COMMUNITY): Payer: Self-pay | Admitting: General Surgery

## 2012-07-19 ENCOUNTER — Inpatient Hospital Stay (HOSPITAL_COMMUNITY): Payer: Medicare Other

## 2012-07-19 DIAGNOSIS — R918 Other nonspecific abnormal finding of lung field: Secondary | ICD-10-CM | POA: Diagnosis not present

## 2012-07-19 DIAGNOSIS — R0989 Other specified symptoms and signs involving the circulatory and respiratory systems: Secondary | ICD-10-CM | POA: Diagnosis not present

## 2012-07-19 LAB — COMPREHENSIVE METABOLIC PANEL
ALT: 39 U/L (ref 0–53)
AST: 45 U/L — ABNORMAL HIGH (ref 0–37)
Albumin: 2.7 g/dL — ABNORMAL LOW (ref 3.5–5.2)
Alkaline Phosphatase: 60 U/L (ref 39–117)
BUN: 24 mg/dL — ABNORMAL HIGH (ref 6–23)
CO2: 26 mEq/L (ref 19–32)
Calcium: 8.3 mg/dL — ABNORMAL LOW (ref 8.4–10.5)
Chloride: 103 mEq/L (ref 96–112)
Creatinine, Ser: 1.49 mg/dL — ABNORMAL HIGH (ref 0.50–1.35)
GFR calc Af Amer: 51 mL/min — ABNORMAL LOW (ref >90)
GFR calc non Af Amer: 44 mL/min — ABNORMAL LOW (ref >90)
Glucose, Bld: 114 mg/dL — ABNORMAL HIGH (ref 70–99)
Potassium: 4.4 mEq/L (ref 3.5–5.1)
Sodium: 137 mEq/L (ref 135–145)
Total Bilirubin: 0.5 mg/dL (ref 0.3–1.2)
Total Protein: 5.8 g/dL — ABNORMAL LOW (ref 6.0–8.3)

## 2012-07-19 LAB — CBC
HCT: 34 % — ABNORMAL LOW (ref 39.0–52.0)
Hemoglobin: 11.1 g/dL — ABNORMAL LOW (ref 13.0–17.0)
MCH: 28.8 pg (ref 26.0–34.0)
MCHC: 32.6 g/dL (ref 30.0–36.0)
MCV: 88.3 fL (ref 78.0–100.0)
Platelets: 132 10*3/uL — ABNORMAL LOW (ref 150–400)
RBC: 3.85 MIL/uL — ABNORMAL LOW (ref 4.22–5.81)
RDW: 14.6 % (ref 11.5–15.5)
WBC: 6.4 10*3/uL (ref 4.0–10.5)

## 2012-07-19 LAB — GLUCOSE, CAPILLARY
Glucose-Capillary: 103 mg/dL — ABNORMAL HIGH (ref 70–99)
Glucose-Capillary: 105 mg/dL — ABNORMAL HIGH (ref 70–99)
Glucose-Capillary: 116 mg/dL — ABNORMAL HIGH (ref 70–99)
Glucose-Capillary: 119 mg/dL — ABNORMAL HIGH (ref 70–99)
Glucose-Capillary: 125 mg/dL — ABNORMAL HIGH (ref 70–99)
Glucose-Capillary: 127 mg/dL — ABNORMAL HIGH (ref 70–99)
Glucose-Capillary: 137 mg/dL — ABNORMAL HIGH (ref 70–99)

## 2012-07-19 MED ORDER — ACETAMINOPHEN 325 MG PO TABS
650.0000 mg | ORAL_TABLET | Freq: Four times a day (QID) | ORAL | Status: DC | PRN
  Administered 2012-07-19 – 2012-07-20 (×2): 650 mg via ORAL
  Filled 2012-07-19 (×2): qty 2

## 2012-07-19 NOTE — Progress Notes (Signed)
Temp up to 102.9  He was on Invanz, but changed after 1st dose and is now on rocephin 2 gm q 24 hours. Normal post op tenderness, but nothing significant.  I will get blood cultures,and recheck labs in Am.

## 2012-07-19 NOTE — Progress Notes (Signed)
Patient seen and examined.  Still having issues with nausea.

## 2012-07-19 NOTE — Progress Notes (Signed)
1 Day Post-Op  Subjective: Patient reports some abdominal soreness today.  Having nausea with full liquids but no vomiting.  Has not had a bowel movement.  Reports passing a small amount of gas yesterday, but none today.  Objective: Vital signs in last 24 hours: Temp:  [99.3 F (37.4 C)-102.8 F (39.3 C)] 99.6 F (37.6 C) (05/13 0557) Pulse Rate:  [65-88] 78 (05/13 0557) Resp:  [17-21] 18 (05/13 0557) BP: (89-129)/(34-69) 110/53 mmHg (05/13 0557) SpO2:  [91 %-96 %] 94 % (05/13 0557) Weight:  [191 lb (86.637 kg)] 191 lb (86.637 kg) (05/12 1412) Last BM Date: 07/16/12  Intake/Output from previous day: 05/12 0701 - 05/13 0700 In: 2100 [I.V.:2100] Out: 395 [Urine:375; Blood:20] Intake/Output this shift:    Physical Exam Gen: alert, cooperative, NAD Abd: decreased bowel sounds, moderately distended, diffuse mild tenderness  Lab Results:   Recent Labs  07/18/12 1456 07/19/12 0514  WBC 5.8 6.4  HGB 11.8* 11.1*  HCT 36.1* 34.0*  PLT 157 132*   BMET  Recent Labs  07/18/12 0645 07/18/12 1456 07/19/12 0514  NA 138  --  137  K 3.8  --  4.4  CL 103  --  103  CO2 26  --  26  GLUCOSE 107*  --  114*  BUN 22  --  24*  CREATININE 1.43* 1.53* 1.49*  CALCIUM 8.9  --  8.3*   PT/INR No results found for this basename: LABPROT, INR,  in the last 72 hours ABG No results found for this basename: PHART, PCO2, PO2, HCO3,  in the last 72 hours  Studies/Results: Dg Cholangiogram Operative  07/18/2012  *RADIOLOGY REPORT*  Clinical Data: Acute cholecystitis  INTRAOPERATIVE CHOLANGIOGRAM  Technique:  Multiple fluoroscopic spot radiographs were obtained during intraoperative cholangiogram and are submitted for interpretation post-operatively.  Comparison: CT abdomen pelvis of 07/17/2012 and ultrasound abdomen of the same date  Findings: Contrast was injected via the cystic duct.  There is good opacification of the common bile duct.  No filling defect is seen, and there is free passage of  contrast into the duodenal loop.  The contrast does have a somewhat "shouldered" appearance within the descending duodenum of questionable significance.  Has the patient undergone recent endoscopy or upper GI on which this area would have been visualized?  IMPRESSION:  1.  Negative intraoperative cholangiogram. 2.  Some shouldering of the mucosa of the descending duodenum of questionable significance.  Correlate clinically.   Original Report Authenticated By: Ivar Drape, M.D.    US Abdomen Complete  07/18/2012  *RADIOLOGY REPORT*  Clinical Data:  Right upper quadrant abdominal pain  COMPLETE ABDOMINAL ULTRASOUND  Comparison:  07/17/2012 CT  Findings:  Gallbladder:  Gallstones.  Marked gallbladder wall thickening at 10 mm. Small amount of perihepatic/pericholecystic fluid.  Common bile duct:  Prominent at 7 mm within the proximal and mid duct.  The distal duct is obscured.  Liver:  No focal lesion identified.  Upper normal limits to mildly increased in parenchymal echogenicity.  IVC:  Appears normal.  Pancreas:  Unable to visualize the head/neck/body/or tail due to limited acoustic windows/bowel gas artifact.  Spleen:  Measures 6 cm oblique.  No focal abnormality.  Right Kidney:  Measures 10 cm.  No hydronephrosis or focal abnormality.  Left Kidney:  Measures 12 cm.  No hydronephrosis. Exophytic 2 cm hypodensity appears nearly anechoic.  Abdominal aorta:  No aneurysm identified. Portions of the aorta are suboptimally visualized however normal on recent CT.  IMPRESSION: Distended gallbladder with  gallstones and marked gallbladder wall edema. May reflect acute cholecystitis in the appropriate clinical setting.  Mild CBD prominence within the proximal and mid duct, the distal duct is obscured.  Correlate with LFTs has choledocholithiasis is not excluded.  Increased hepatic echogenicity may reflect steatosis.   Original Report Authenticated By: Carlos Levering, M.D.     Anti-infectives: Anti-infectives   Start      Dose/Rate Route Frequency Ordered Stop   07/19/12 1100  cefTRIAXone (ROCEPHIN) 2 g in dextrose 5 % 50 mL IVPB     2 g 100 mL/hr over 30 Minutes Intravenous Every 24 hours 07/18/12 1433     07/18/12 1100  [MAR Hold]  cefTRIAXone (ROCEPHIN) 1 g in dextrose 5 % 50 mL IVPB     (On MAR Hold since 07/18/12 1030)   1 g 100 mL/hr over 30 Minutes Intravenous  Once 07/18/12 0828 07/18/12 1133   07/17/12 2245  ertapenem (INVANZ) 1 g in sodium chloride 0.9 % 50 mL IVPB     1 g 100 mL/hr over 30 Minutes Intravenous  Once 07/17/12 2232 07/18/12 0236      Assessment/Plan: Acute Cholecystitis: s/p Procedure(s): LAPAROSCOPIC CHOLECYSTECTOMY WITH INTRAOPERATIVE CHOLANGIOGRAM (N/A) - likely with post op ileus - as expected abdominal soreness following surgery - low grade fevers overnight, likely post anesthesia as white count remains normal  Plan - continue IV antibiotics - continue full liquid diet for now, zofran prn, if develops vomiting will need to back down - ambulate as tolerated - dispo pending tolerating diet and passing gas, likely tomorrow   LOS: 2 days    BOOTH, ERIN 07/19/2012

## 2012-07-20 LAB — COMPREHENSIVE METABOLIC PANEL
ALT: 43 U/L (ref 0–53)
AST: 44 U/L — ABNORMAL HIGH (ref 0–37)
Albumin: 2.3 g/dL — ABNORMAL LOW (ref 3.5–5.2)
Alkaline Phosphatase: 61 U/L (ref 39–117)
BUN: 17 mg/dL (ref 6–23)
CO2: 26 mEq/L (ref 19–32)
Calcium: 8.1 mg/dL — ABNORMAL LOW (ref 8.4–10.5)
Chloride: 104 mEq/L (ref 96–112)
Creatinine, Ser: 1.21 mg/dL (ref 0.50–1.35)
GFR calc Af Amer: 65 mL/min — ABNORMAL LOW (ref >90)
GFR calc non Af Amer: 56 mL/min — ABNORMAL LOW (ref >90)
Glucose, Bld: 120 mg/dL — ABNORMAL HIGH (ref 70–99)
Potassium: 4.1 mEq/L (ref 3.5–5.1)
Sodium: 136 mEq/L (ref 135–145)
Total Bilirubin: 0.4 mg/dL (ref 0.3–1.2)
Total Protein: 5.6 g/dL — ABNORMAL LOW (ref 6.0–8.3)

## 2012-07-20 LAB — CBC
HCT: 30.5 % — ABNORMAL LOW (ref 39.0–52.0)
Hemoglobin: 10 g/dL — ABNORMAL LOW (ref 13.0–17.0)
MCH: 28.7 pg (ref 26.0–34.0)
MCHC: 32.8 g/dL (ref 30.0–36.0)
MCV: 87.4 fL (ref 78.0–100.0)
Platelets: 125 10*3/uL — ABNORMAL LOW (ref 150–400)
RBC: 3.49 MIL/uL — ABNORMAL LOW (ref 4.22–5.81)
RDW: 14.2 % (ref 11.5–15.5)
WBC: 5.8 10*3/uL (ref 4.0–10.5)

## 2012-07-20 LAB — GLUCOSE, CAPILLARY
Glucose-Capillary: 130 mg/dL — ABNORMAL HIGH (ref 70–99)
Glucose-Capillary: 95 mg/dL (ref 70–99)
Glucose-Capillary: 133 mg/dL — ABNORMAL HIGH (ref 70–99)
Glucose-Capillary: 132 mg/dL — ABNORMAL HIGH (ref 70–99)
Glucose-Capillary: 172 mg/dL — ABNORMAL HIGH (ref 70–99)

## 2012-07-20 NOTE — Progress Notes (Signed)
2 Days Post-Op  Subjective: Fever last night.  Tolerating full liquids without nausea.  Objective: Vital signs in last 24 hours: Temp:  [98.4 F (36.9 C)-102.9 F (39.4 C)] 98.4 F (36.9 C) (05/14 0540) Pulse Rate:  [78-88] 78 (05/14 0540) Resp:  [18] 18 (05/14 0540) BP: (118-133)/(53-64) 133/64 mmHg (05/14 0540) SpO2:  [91 %-97 %] 97 % (05/14 0540) Last BM Date: 07/16/12  Intake/Output from previous day: 05/13 0701 - 05/14 0700 In: 7858 [P.O.:820; I.V.:2500] Out: -  Intake/Output this shift:    PE: General- In NAD Abdomen-soft, incisions clean and intact, few bowel sounds.  Lab Results:   Recent Labs  07/19/12 0514 07/20/12 0520  WBC 6.4 5.8  HGB 11.1* 10.0*  HCT 34.0* 30.5*  PLT 132* 125*   BMET  Recent Labs  07/19/12 0514 07/20/12 0520  NA 137 136  K 4.4 4.1  CL 103 104  CO2 26 26  GLUCOSE 114* 120*  BUN 24* 17  CREATININE 1.49* 1.21  CALCIUM 8.3* 8.1*   PT/INR No results found for this basename: LABPROT, INR,  in the last 72 hours Comprehensive Metabolic Panel:    Component Value Date/Time   NA 136 07/20/2012 0520   K 4.1 07/20/2012 0520   CL 104 07/20/2012 0520   CO2 26 07/20/2012 0520   BUN 17 07/20/2012 0520   CREATININE 1.21 07/20/2012 0520   GLUCOSE 120* 07/20/2012 0520   CALCIUM 8.1* 07/20/2012 0520   AST 44* 07/20/2012 0520   ALT 43 07/20/2012 0520   ALKPHOS 61 07/20/2012 0520   BILITOT 0.4 07/20/2012 0520   PROT 5.6* 07/20/2012 0520   ALBUMIN 2.3* 07/20/2012 0520     Studies/Results: Dg Cholangiogram Operative  07/18/2012   *RADIOLOGY REPORT*  Clinical Data: Acute cholecystitis  INTRAOPERATIVE CHOLANGIOGRAM  Technique:  Multiple fluoroscopic spot radiographs were obtained during intraoperative cholangiogram and are submitted for interpretation post-operatively.  Comparison: CT abdomen pelvis of 07/17/2012 and ultrasound abdomen of the same date  Findings: Contrast was injected via the cystic duct.  There is good opacification of the common  bile duct.  No filling defect is seen, and there is free passage of contrast into the duodenal loop.  The contrast does have a somewhat "shouldered" appearance within the descending duodenum of questionable significance.  Has the patient undergone recent endoscopy or upper GI on which this area would have been visualized?  IMPRESSION:  1.  Negative intraoperative cholangiogram. 2.  Some shouldering of the mucosa of the descending duodenum of questionable significance.  Correlate clinically.   Original Report Authenticated By: Ivar Drape, M.D.   Dg Chest Port 1 View  07/19/2012   *RADIOLOGY REPORT*  Clinical Data: Postop fever, cough.  PORTABLE CHEST - 1 VIEW  Comparison: 07/17/2012  Findings: Bilateral lower lobe airspace opacities are noted, increasing since prior study.  This could represent atelectasis or pneumonia.  Mild vascular congestion.  Borderline heart size.  No visible significant effusion.  No acute bony abnormality.  IMPRESSION: Increasing bilateral lower lobe atelectasis or consolidation.   Original Report Authenticated By: Rolm Baptise, M.D.    Anti-infectives: Anti-infectives   Start     Dose/Rate Route Frequency Ordered Stop   07/19/12 1100  cefTRIAXone (ROCEPHIN) 2 g in dextrose 5 % 50 mL IVPB     2 g 100 mL/hr over 30 Minutes Intravenous Every 24 hours 07/18/12 1433     07/18/12 1100  [MAR Hold]  cefTRIAXone (ROCEPHIN) 1 g in dextrose 5 % 50 mL IVPB     (  On MAR Hold since 07/18/12 1030)   1 g 100 mL/hr over 30 Minutes Intravenous  Once 07/18/12 0828 07/18/12 1133   07/17/12 2245  ertapenem (INVANZ) 1 g in sodium chloride 0.9 % 50 mL IVPB     1 g 100 mL/hr over 30 Minutes Intravenous  Once 07/17/12 2232 07/18/12 0236      Assessment  Acute Cholecystitis: s/p LAPAROSCOPIC CHOLECYSTECTOMY WITH INTRAOPERATIVE CHOLANGIOGRAM-still with some fever; WBC normal ABL anemia    LOS: 3 days   Plan: Ambulate.  Continue IV abxs.  Advance diet.  Recheck CBC  tomorrow.   Nahomi Hegner J 07/20/2012

## 2012-07-20 NOTE — ED Provider Notes (Signed)
Medical screening examination/treatment/procedure(s) were conducted as a shared visit with non-physician practitioner(s) and myself.  I personally evaluated the patient during the encounter.  Please see completed note.  Virgel Manifold, MD 07/20/12 [phone]

## 2012-07-21 LAB — GLUCOSE, CAPILLARY
Glucose-Capillary: 102 mg/dL — ABNORMAL HIGH (ref 70–99)
Glucose-Capillary: 128 mg/dL — ABNORMAL HIGH (ref 70–99)
Glucose-Capillary: 141 mg/dL — ABNORMAL HIGH (ref 70–99)
Glucose-Capillary: 170 mg/dL — ABNORMAL HIGH (ref 70–99)
Glucose-Capillary: 94 mg/dL (ref 70–99)
Glucose-Capillary: 98 mg/dL (ref 70–99)

## 2012-07-21 LAB — CBC
HCT: 29.6 % — ABNORMAL LOW (ref 39.0–52.0)
Hemoglobin: 9.9 g/dL — ABNORMAL LOW (ref 13.0–17.0)
MCH: 28.9 pg (ref 26.0–34.0)
MCHC: 33.4 g/dL (ref 30.0–36.0)
MCV: 86.3 fL (ref 78.0–100.0)
Platelets: 167 10*3/uL (ref 150–400)
RBC: 3.43 MIL/uL — ABNORMAL LOW (ref 4.22–5.81)
RDW: 14.2 % (ref 11.5–15.5)
WBC: 5.1 10*3/uL (ref 4.0–10.5)

## 2012-07-21 MED ORDER — PANTOPRAZOLE SODIUM 40 MG PO TBEC
40.0000 mg | DELAYED_RELEASE_TABLET | Freq: Every day | ORAL | Status: DC
  Administered 2012-07-21: 40 mg via ORAL
  Filled 2012-07-21: qty 1

## 2012-07-21 MED ORDER — INSULIN ASPART 100 UNIT/ML ~~LOC~~ SOLN
0.0000 [IU] | Freq: Three times a day (TID) | SUBCUTANEOUS | Status: DC
  Administered 2012-07-21 – 2012-07-22 (×3): 2 [IU] via SUBCUTANEOUS

## 2012-07-21 MED ORDER — AMOXICILLIN-POT CLAVULANATE 875-125 MG PO TABS
1.0000 | ORAL_TABLET | Freq: Two times a day (BID) | ORAL | Status: DC
  Administered 2012-07-21: 1 via ORAL
  Filled 2012-07-21 (×3): qty 1

## 2012-07-21 NOTE — Progress Notes (Signed)
3 Days Post-Op  Subjective: Had some N/V after eating yesterday.  Better this morning.  Had a BM yesterday.  Objective: Vital signs in last 24 hours: Temp:  [98.2 F (36.8 C)-99.9 F (37.7 C)] 98.6 F (37 C) (05/15 0525) Pulse Rate:  [67-97] 67 (05/15 0525) Resp:  [17-18] 18 (05/15 0525) BP: (124-128)/(57-66) 128/63 mmHg (05/15 0525) SpO2:  [89 %-97 %] 97 % (05/15 0525) Last BM Date: 07/20/12  Intake/Output from previous day: 05/14 0701 - 05/15 0700 In: 3150 [P.O.:850; I.V.:2300] Out: -  Intake/Output this shift:    PE: General- In NAD Abdomen-soft, incisions clean and intact, more bowel sounds today  Lab Results:   Recent Labs  07/20/12 0520 07/21/12 0450  WBC 5.8 5.1  HGB 10.0* 9.9*  HCT 30.5* 29.6*  PLT 125* 167   BMET  Recent Labs  07/19/12 0514 07/20/12 0520  NA 137 136  K 4.4 4.1  CL 103 104  CO2 26 26  GLUCOSE 114* 120*  BUN 24* 17  CREATININE 1.49* 1.21  CALCIUM 8.3* 8.1*   PT/INR No results found for this basename: LABPROT, INR,  in the last 72 hours Comprehensive Metabolic Panel:    Component Value Date/Time   NA 136 07/20/2012 0520   K 4.1 07/20/2012 0520   CL 104 07/20/2012 0520   CO2 26 07/20/2012 0520   BUN 17 07/20/2012 0520   CREATININE 1.21 07/20/2012 0520   GLUCOSE 120* 07/20/2012 0520   CALCIUM 8.1* 07/20/2012 0520   AST 44* 07/20/2012 0520   ALT 43 07/20/2012 0520   ALKPHOS 61 07/20/2012 0520   BILITOT 0.4 07/20/2012 0520   PROT 5.6* 07/20/2012 0520   ALBUMIN 2.3* 07/20/2012 0520     Studies/Results: Dg Chest Port 1 View  07/19/2012   *RADIOLOGY REPORT*  Clinical Data: Postop fever, cough.  PORTABLE CHEST - 1 VIEW  Comparison: 07/17/2012  Findings: Bilateral lower lobe airspace opacities are noted, increasing since prior study.  This could represent atelectasis or pneumonia.  Mild vascular congestion.  Borderline heart size.  No visible significant effusion.  No acute bony abnormality.  IMPRESSION: Increasing bilateral lower lobe  atelectasis or consolidation.   Original Report Authenticated By: Rolm Baptise, M.D.    Anti-infectives: Anti-infectives   Start     Dose/Rate Route Frequency Ordered Stop   07/19/12 1100  cefTRIAXone (ROCEPHIN) 2 g in dextrose 5 % 50 mL IVPB     2 g 100 mL/hr over 30 Minutes Intravenous Every 24 hours 07/18/12 1433     07/18/12 1100  [MAR Hold]  cefTRIAXone (ROCEPHIN) 1 g in dextrose 5 % 50 mL IVPB     (On MAR Hold since 07/18/12 1030)   1 g 100 mL/hr over 30 Minutes Intravenous  Once 07/18/12 0828 07/18/12 1133   07/17/12 2245  ertapenem (INVANZ) 1 g in sodium chloride 0.9 % 50 mL IVPB     1 g 100 mL/hr over 30 Minutes Intravenous  Once 07/17/12 2232 07/18/12 0236      Assessment  Acute Cholecystitis: s/p LAPAROSCOPIC CHOLECYSTECTOMY WITH INTRAOPERATIVE CHOLANGIOGRAM-slowly improving; fever trending down. ABL anemia-stable    LOS: 4 days   Plan:  Stop IVF.  Continue IV abxs.  Hopefully home tomorrow on oral abxs.   Sun Kihn J 07/21/2012

## 2012-07-22 ENCOUNTER — Encounter (HOSPITAL_COMMUNITY): Payer: Self-pay | Admitting: General Surgery

## 2012-07-22 DIAGNOSIS — R5082 Postprocedural fever: Secondary | ICD-10-CM | POA: Diagnosis not present

## 2012-07-22 DIAGNOSIS — K810 Acute cholecystitis: Secondary | ICD-10-CM | POA: Diagnosis present

## 2012-07-22 DIAGNOSIS — K567 Ileus, unspecified: Secondary | ICD-10-CM

## 2012-07-22 DIAGNOSIS — K9189 Other postprocedural complications and disorders of digestive system: Secondary | ICD-10-CM | POA: Diagnosis not present

## 2012-07-22 HISTORY — DX: Ileus, unspecified: K56.7

## 2012-07-22 LAB — GLUCOSE, CAPILLARY: Glucose-Capillary: 124 mg/dL — ABNORMAL HIGH (ref 70–99)

## 2012-07-22 MED ORDER — ACETAMINOPHEN 325 MG PO TABS: 650.0000 mg | ORAL_TABLET | Freq: Four times a day (QID) | ORAL | Status: DC | PRN

## 2012-07-22 MED ORDER — AMOXICILLIN-POT CLAVULANATE 875-125 MG PO TABS: 1.0000 | ORAL_TABLET | Freq: Two times a day (BID) | ORAL | Status: DC

## 2012-07-22 MED ORDER — HYDROCODONE-ACETAMINOPHEN 5-325 MG PO TABS: ORAL_TABLET | ORAL | Status: DC

## 2012-07-22 NOTE — Progress Notes (Signed)
Physician Discharge Summary  Patient ID: Nicholas Patton MRN: [id number] DOB/AGE: 76-Nov-1936 76 y.o.  Admit date: 07/17/2012 Discharge date: 07/22/2012  Admission Diagnoses: Acute Cholecystitis DIABETES MELLITUS, TYPE II  HYPERTENSION  Remote HX TOBACCO USE  Anemia with B12 deficiency  Vitamin D deficiency  Hyperlipidemia   Discharge Diagnoses:  Acute Cholecystitis: post op ileus  Post op Fevers  DIABETES MELLITUS, TYPE II  HYPERTENSION  PERS HX TOBACCO USE  Anemia with B12 deficiency  Vitamin D deficiency  Hyperlipidemia  Principal Problem:   Cholecystitis, acute Active Problems:   Ileus following gastrointestinal surgery   Postoperative fever   PROCEDURES: [employer], MD TOMY WITH INTRAOPERATIVE CHOLANGIOGRAM, 07/18/2012,     Hospital Course:  78 yo AAM awoke Sunday morning around 0200 with rRUQ/rt sided abd pain followed by N/v. No prior symptoms. The pain was constant. He presented to [facility] emergency room where he was evaluated with labs and a CT scan. His labs were normal and his CT scan demonstrated cholelithiasis without evidence of cholecystitis. His pain had resolved and he was discharged home with plans to followup at our office. Later in the afternoon the patient developed a fever of around 102. His abdominal pain had returned. His family checked his temperature several times and it was remaining above 101 and he was brought back to the emergency room. Repeat labs are still normal. He underwent an ultrasound which showed a dilated gallbladder wall as well as some pericholecystic fluid. He was seen and admitted by Dr. Redmond Pulling.  He was taken to the OR the following day. He had an acutely inflamed, edematous gallbladder.  Pt also had purulent fluid right gutter.  He was maintained on IV antibiotics but continued to have fever.  He also had some ileus post op.  He was maintained on IV antibiotics and bowel rest till his fever came down.   Blood culture orders were written, but I do not see where a blood culture was obtained.  He has made good improvement and was ready for d/c this AM, taking a regular diet. He has had 4 days of Ceftriaxone, and we are sending him home on 4 more days of Augmentin.   He will follow up in 2 weeks in our office, sooner if he has an issue.  Condition on D/C: Improved   Disposition: 01-Home or Self Care       Future Appointments Provider Department Dept Phone   09/13/2012 2:30 PM [name], MD [facility] [phone]       Medication List    STOP taking these medications       oxyCODONE-acetaminophen 5-325 MG per tablet  Commonly known as:  PERCOCET/ROXICET      TAKE these medications       acetaminophen 325 MG tablet  Commonly known as:  TYLENOL  Take 2 tablets (650 mg total) by mouth every 6 (six) hours as needed.     amoxicillin-clavulanate 875-125 MG per tablet  Commonly known as:  AUGMENTIN  Take 1 tablet by mouth every 12 (twelve) hours.     aspirin 81 MG EC tablet  Take 81 mg by mouth daily.     atorvastatin 40 MG tablet  Commonly known as:  LIPITOR  Take 40 mg by mouth daily.     glimepiride 1 MG tablet  Commonly known as:  AMARYL  Take 0.5 mg by mouth 2 (two) times daily before a meal.     HYDROcodone-acetaminophen 5-325  MG per tablet  Commonly known as:  NORCO/VICODIN  This has tylenol (acetaminophen) do not take more than 4000 mg daily.     losartan-hydrochlorothiazide 100-25 MG per tablet  Commonly known as:  HYZAAR  Take 1 tablet by mouth daily.     metFORMIN 500 MG tablet  Commonly known as:  GLUCOPHAGE  Take 1,000 mg by mouth 2 (two) times daily with a meal.     omeprazole 20 MG capsule  Commonly known as:  PRILOSEC  Take 20 mg by mouth daily.     ondansetron 4 MG disintegrating tablet  Commonly known as:  ZOFRAN ODT  Take 1 tablet (4 mg total) by mouth every 8 (eight) hours as needed for nausea.     Vitamin D3 1000  UNITS Caps  Take 1 capsule by mouth daily.       Follow-up Information   Follow up with [facility] E, MD. Schedule an appointment as soon as possible for a visit in 2 weeks. (Make an appointment for 2-3 weeks.  Ask for the [place] clinic, and if it is full see Dr.Thompsonl)    Contact information:   [place] [id number] [phone]       Signed: Earnstine Regal 07/22/2012, 11:09 AM

## 2012-07-22 NOTE — Progress Notes (Signed)
Nicholas Skeans, MD, MPH, FACS Pager: [phone]

## 2012-07-22 NOTE — Progress Notes (Signed)
Pt was given with understanding of discharge and RX,  FU care and education of s/s of infection and to call MD

## 2012-07-22 NOTE — Progress Notes (Signed)
4 Days Post-Op  Subjective: Feels much better this AM.  Incisions look good and he's eating a regular diet.  Objective: Vital signs in last 24 hours: Temp:  [98.9 F (37.2 C)-99.5 F (37.5 C)] 98.9 F (37.2 C) (05/16 0602) Pulse Rate:  [75-100] 85 (05/16 0602) Resp:  [17-20] 17 (05/16 0602) BP: (136-149)/(58-70) 149/70 mmHg (05/16 0602) SpO2:  [91 %-95 %] 92 % (05/16 0602) Last BM Date: 07/20/12 Nothing PO recorded. TM 99.5, VSS, No labs Started on Augmentin last PM Intake/Output from previous day: 05/15 0701 - 05/16 0700 In: 300 [I.V.:300] Out: -  Intake/Output this shift:    General appearance: alert, cooperative and no distress Resp: clear to auscultation bilaterally GI: soft, minimal tenderness, +flatus, BM yesterday incisions look good.  He is not distended.  Lab Results:   Recent Labs  07/20/12 0520 07/21/12 0450  WBC 5.8 5.1  HGB 10.0* 9.9*  HCT 30.5* 29.6*  PLT 125* 167    BMET  Recent Labs  07/20/12 0520  NA 136  K 4.1  CL 104  CO2 26  GLUCOSE 120*  BUN 17  CREATININE 1.21  CALCIUM 8.1*   PT/INR No results found for this basename: LABPROT, INR,  in the last 72 hours   Recent Labs Lab 07/17/12 0443 07/17/12 2119 07/18/12 0645 07/19/12 0514 07/20/12 0520  AST _0 45* 44*  ALT _1 39 43  ALKPHOS 109 68 67 60 61  BILITOT 0.3 0.6 0.7 0.5 0.4  PROT 7.2 6.8 6.2 5.8* 5.6*  ALBUMIN 4.3 3.9 3.3* 2.7* 2.3*     Lipase     Component Value Date/Time   LIPASE 16 07/17/2012 2119     Studies/Results: No results found.  Medications: . amoxicillin-clavulanate  1 tablet Oral Q12H  . enoxaparin (LOVENOX) injection  40 mg Subcutaneous Q24H  . glimepiride  0.5 mg Oral BID AC  . hydrochlorothiazide  25 mg Oral Daily  . insulin aspart  0-15 Units Subcutaneous TID WC  . losartan  100 mg Oral Daily  . pantoprazole  40 mg Oral QHS    Assessment/Plan Acute Cholecystitis: s/p Procedure(s):  LAPAROSCOPIC CHOLECYSTECTOMY WITH  INTRAOPERATIVE CHOLANGIOGRAM (N/A)  post op ileus  Post op Fevers DIABETES MELLITUS, TYPE II  HYPERTENSION  PERS HX TOBACCO USE Anemia with B12 deficiency Vitamin D deficiency Hyperlipidemia    Plan:  Home today on 3 more days of antibiotics.  Follow up in 2 weeks.   LOS: 5 days    Andrew Soria 07/22/2012

## 2012-07-22 NOTE — Progress Notes (Signed)
readu for D/C Patient examined and I agree with the assessment and plan  Georganna Skeans, MD, MPH, FACS Pager: [phone]  07/22/2012 12:46 PM

## 2012-08-03 ENCOUNTER — Ambulatory Visit (INDEPENDENT_AMBULATORY_CARE_PROVIDER_SITE_OTHER): Payer: Medicare Other | Admitting: General Surgery

## 2012-08-03 VITALS — BP 140/90 | HR 78 | Temp 98.0°F | Resp 18 | Ht 75.0 in | Wt 179.0 lb

## 2012-08-03 DIAGNOSIS — Z9049 Acquired absence of other specified parts of digestive tract: Secondary | ICD-10-CM

## 2012-08-03 DIAGNOSIS — Z9889 Other specified postprocedural states: Secondary | ICD-10-CM

## 2012-08-03 NOTE — Progress Notes (Signed)
Subjective:     Patient ID: Nicholas Patton, male   DOB: 1934/05/28, 78 y.o.   MRN: [id number]  HPI  Patient presents status post urgent cholecystectomy for separative cholecystitis. He is doing well. His daughter taking any pain medication. He completed his course of antibiotics postoperatively. Review of Systems     Objective:   Physical Exam Abdomen is soft and nontender. All 4 incisions are well-healed without signs of infection.    Assessment:     Doing very well status post urgent cholecystectomy for this operative cholecystitis. Pathology report was reviewed with him.    Plan:     Avoid heavy lifting for a total of 4 weeks after surgery. I advised him fatty foods may give him diarrhea for the first month or 2 but  he has no dietary Restrictions at this time. Return as needed.

## 2012-08-23 ENCOUNTER — Encounter (INDEPENDENT_AMBULATORY_CARE_PROVIDER_SITE_OTHER): Payer: Self-pay

## 2012-08-23 ENCOUNTER — Telehealth (INDEPENDENT_AMBULATORY_CARE_PROVIDER_SITE_OTHER): Payer: Self-pay

## 2012-08-23 NOTE — Telephone Encounter (Signed)
Message copied by Gweneth Fritter on Tue Aug 23, 2012 11:22 AM ------      Message from: Salvatore Marvel      Created: Tue Aug 23, 2012  9:09 AM      Regarding: Work Note      Contact: [phone]       Patient needs a work note state he can come back to work next week (08/31/2012), please call him.      Thank you. ------

## 2012-08-23 NOTE — Telephone Encounter (Signed)
I called the pt.  He is doing well.  He drives cars for work.  He is going back on 08/31/2012.  I will put a letter up front for him to pick up.

## 2012-09-13 ENCOUNTER — Ambulatory Visit (INDEPENDENT_AMBULATORY_CARE_PROVIDER_SITE_OTHER): Payer: Medicare Other | Admitting: Internal Medicine

## 2012-09-13 ENCOUNTER — Encounter: Payer: Self-pay | Admitting: Internal Medicine

## 2012-09-13 ENCOUNTER — Other Ambulatory Visit (INDEPENDENT_AMBULATORY_CARE_PROVIDER_SITE_OTHER): Payer: Medicare Other

## 2012-09-13 VITALS — BP 130/72 | HR 72 | Temp 97.9°F | Ht 75.0 in | Wt 183.0 lb

## 2012-09-13 DIAGNOSIS — H9190 Unspecified hearing loss, unspecified ear: Secondary | ICD-10-CM

## 2012-09-13 DIAGNOSIS — H9192 Unspecified hearing loss, left ear: Secondary | ICD-10-CM | POA: Insufficient documentation

## 2012-09-13 DIAGNOSIS — I10 Essential (primary) hypertension: Secondary | ICD-10-CM

## 2012-09-13 DIAGNOSIS — N320 Bladder-neck obstruction: Secondary | ICD-10-CM | POA: Diagnosis not present

## 2012-09-13 DIAGNOSIS — H6990 Unspecified Eustachian tube disorder, unspecified ear: Secondary | ICD-10-CM | POA: Insufficient documentation

## 2012-09-13 DIAGNOSIS — N529 Male erectile dysfunction, unspecified: Secondary | ICD-10-CM

## 2012-09-13 DIAGNOSIS — E119 Type 2 diabetes mellitus without complications: Secondary | ICD-10-CM | POA: Diagnosis not present

## 2012-09-13 DIAGNOSIS — H6980 Other specified disorders of Eustachian tube, unspecified ear: Secondary | ICD-10-CM | POA: Insufficient documentation

## 2012-09-13 DIAGNOSIS — H6982 Other specified disorders of Eustachian tube, left ear: Secondary | ICD-10-CM

## 2012-09-13 DIAGNOSIS — J309 Allergic rhinitis, unspecified: Secondary | ICD-10-CM

## 2012-09-13 LAB — LIPID PANEL
Cholesterol: 122 mg/dL (ref 0–200)
HDL: 49 mg/dL (ref >39.00)
LDL Cholesterol: 38 mg/dL (ref 0–99)
Total CHOL/HDL Ratio: 2
Triglycerides: 174 mg/dL — ABNORMAL HIGH (ref 0.0–149.0)
VLDL: 34.8 mg/dL (ref 0.0–40.0)

## 2012-09-13 LAB — PSA: PSA: 2.8 ng/mL (ref 0.10–4.00)

## 2012-09-13 LAB — TSH: TSH: 3.04 u[IU]/mL (ref 0.35–5.50)

## 2012-09-13 NOTE — Patient Instructions (Addendum)
Please take all new medication as prescribed- the nasonex, at 1 spray/side per day Please call for prescription if this seems to help You can also take Mucinex (or it's generic off brand) for ear congestion, and tylenol as needed for pain. You will be contacted regarding the referral for: ENT for the left hearing loss Please continue all other medications as before, and refills have been done if requested. Please have the pharmacy call with any other refills you may need. Please go to the LAB in the Basement (turn left off the elevator) for the tests to be done today You will be contacted by phone if any changes need to be made immediately.  Otherwise, you will receive a letter about your results with an explanation, but please check with MyChart first.  Please remember to sign up for My Chart if you have not done so, as this will be important to you in the future with finding out test results, communicating by private email, and scheduling acute appointments online when needed.  Please return in 6 months, or sooner if needed

## 2012-09-13 NOTE — Progress Notes (Signed)
Subjective:    Patient ID: Nicholas Patton, male    DOB: [birth year], 78 y.o.   MRN: [id number]  HPI  Here to f/u, Does have several wks ongoing nasal allergy symptoms with clearish congestion, itch and sneezing, without fever, pain, ST, cough, swelling or wheezing. Also with left ear popping/crackling, worse to talk and chew, assoc with some tinnitus and decreased hearing with the hearing loss more ongoing prior to recent months.  Pt denies chest pain, increased sob or doe, wheezing, orthopnea, PND, increased LE swelling, palpitations, dizziness or syncope.  Pt denies polydipsia, polyuria, or low sugar symptoms such as weakness or confusion improved with po intake.  Pt denies new neurological symptoms such as new headache, or facial or extremity weakness or numbness.   Pt states overall good compliance with meds.     S/p ccx, has occas diarrhea.  Pt denies fever, wt loss, night sweats, loss of appetite, or other constitutional symptoms  Past Medical History  Diagnosis Date  . ALLERGIC RHINITIS 01/24/2007  . BENIGN PROSTATIC HYPERTROPHY 01/24/2007  . BRONCHITIS, ACUTE 06/11/2008  . Cough 01/24/2007  . DIABETES MELLITUS, TYPE II 01/24/2007  . FATIGUE 07/27/2007  . HYPERLIPIDEMIA 01/24/2007  . HYPERTENSION 10/05/2006  . INSOMNIA 06/21/2008  . LEG PAIN, BILATERAL 10/06/2007  . PERIPHERAL NEUROPATHY, FEET 10/06/2007  . PERS HX TOBACCO USE PRESENTING HAZARDS HEALTH 09/17/2009  . VITAMIN B12 DEFICIENCY 09/06/2008  . VITAMIN D DEFICIENCY 01/16/2010  . Wheezing 01/16/2010  . Cholecystitis, acute 07/22/2012  . Ileus following gastrointestinal surgery 07/22/2012  . Postoperative fever 07/22/2012   Past Surgical History  Procedure Laterality Date  . Total knee arthroplasty  2001    Left  . Turp vaporization    . Cholecystectomy N/A 07/18/2012    Procedure: LAPAROSCOPIC CHOLECYSTECTOMY WITH INTRAOPERATIVE CHOLANGIOGRAM;  Surgeon: Zenovia Jarred, MD;  Location: [facility];  Service: General;  Laterality: N/A;     reports that he has quit smoking. He does not have any smokeless tobacco history on file. He reports that he does not drink alcohol or use illicit drugs. family history includes Lung cancer in his brother. No Known Allergies Current Outpatient Prescriptions on File Prior to Visit  Medication Sig Dispense Refill  . acetaminophen (TYLENOL) 325 MG tablet Take 2 tablets (650 mg total) by mouth every 6 (six) hours as needed.      Marland Kitchen aspirin 81 MG EC tablet Take 81 mg by mouth daily.        Marland Kitchen atorvastatin (LIPITOR) 40 MG tablet Take 40 mg by mouth daily.      . Cholecalciferol (VITAMIN D3) 1000 UNITS CAPS Take 1 capsule by mouth daily.       Marland Kitchen glimepiride (AMARYL) 1 MG tablet Take 0.5 mg by mouth 2 (two) times daily before a meal.       . HYDROcodone-acetaminophen (NORCO/VICODIN) 5-325 MG per tablet This has tylenol (acetaminophen) do not take more than 4000 mg daily.  40 tablet  0  . losartan-hydrochlorothiazide (HYZAAR) 100-25 MG per tablet Take 1 tablet by mouth daily.      . metFORMIN (GLUCOPHAGE) 500 MG tablet Take 1,000 mg by mouth 2 (two) times daily with a meal.      . omeprazole (PRILOSEC) 20 MG capsule Take 20 mg by mouth daily.      . ondansetron (ZOFRAN ODT) 4 MG disintegrating tablet Take 1 tablet (4 mg total) by mouth every 8 (eight) hours as needed for nausea.  12 tablet  0   No  current facility-administered medications on file prior to visit.    Review of Systems  Constitutional: Negative for unexpected weight change, or unusual diaphoresis  HENT: Negative for tinnitus.   Eyes: Negative for photophobia and visual disturbance.  Respiratory: Negative for choking and stridor.   Gastrointestinal: Negative for vomiting and blood in stool.  Genitourinary: Negative for hematuria and decreased urine volume.  Musculoskeletal: Negative for acute joint swelling Skin: Negative for color change and wound.  Neurological: Negative for tremors and numbness other than noted   Psychiatric/Behavioral: Negative for decreased concentration or  hyperactivity.       Objective:   Physical Exam BP 130/72  Pulse 72  Temp(Src) 97.9 F (36.6 C) (Oral)  Ht _0  (1.905 m)  Wt 183 lb (83.008 kg)  BMI 22.87 kg/m2  SpO2 97% VS noted,  Constitutional: Pt appears well-developed and well-nourished.  HENT: Head: NCAT.  Right Ear: External ear normal.  Left Ear: External ear normal.  Bilat tm's with mild erythema.  Max sinus areas mild tender.  Pharynx with mild erythema, no exudate Eyes: Conjunctivae and EOM are normal. Pupils are equal, round, and reactive to light.  Neck: Normal range of motion. Neck supple.  Cardiovascular: Normal rate and regular rhythm.   Pulmonary/Chest: Effort normal and breath sounds normal.  Neurological: Pt is alert. Not confused  Skin: Skin is warm. No erythema.  Psychiatric: Pt behavior is normal. Thought content normal.     Assessment & Plan:

## 2012-09-14 NOTE — Assessment & Plan Note (Signed)
stable overall by history and exam, recent data reviewed with pt, and pt to continue medical treatment as before,  to f/u any worsening symptoms or concerns BP Readings from Last 3 Encounters:  09/13/12 130/72  08/03/12 140/90  07/22/12 149/70

## 2012-09-14 NOTE — Assessment & Plan Note (Signed)
Also for mucinex otc prn,  to f/u any worsening symptoms or concerns

## 2012-09-14 NOTE — Assessment & Plan Note (Signed)
stable overall by history and exam, recent data reviewed with pt, and pt to continue medical treatment as before,  to f/u any worsening symptoms or concerns Lab Results  Component Value Date   HGBA1C 6.3* 07/18/2012    

## 2012-09-14 NOTE — Assessment & Plan Note (Signed)
Mild to mod, for nasonex asd (sample given),  to f/u any worsening symptoms or concerns

## 2012-09-14 NOTE — Assessment & Plan Note (Signed)
Likely some degree o sensorineural hearing loss, for ENT referral

## 2012-09-20 DIAGNOSIS — H9319 Tinnitus, unspecified ear: Secondary | ICD-10-CM | POA: Diagnosis not present

## 2012-09-22 ENCOUNTER — Encounter: Payer: Self-pay | Admitting: Internal Medicine

## 2012-09-22 DIAGNOSIS — N529 Male erectile dysfunction, unspecified: Secondary | ICD-10-CM | POA: Insufficient documentation

## 2012-09-22 NOTE — Assessment & Plan Note (Signed)
Pt does mention need for post t vac apparatus, and if I would sign forms for such, and I agreed, given his unrssponse to other meds

## 2012-09-26 ENCOUNTER — Telehealth (INDEPENDENT_AMBULATORY_CARE_PROVIDER_SITE_OTHER): Payer: Self-pay

## 2012-09-26 NOTE — Telephone Encounter (Signed)
Message copied by Gweneth Fritter on Mon Sep 26, 2012  1:52 PM ------      Message from: Jeanella Craze      Created: Mon Sep 26, 2012 12:18 PM      Regarding: [place]      Contact: [phone]       Patient stated that he is having some pain and would like to be scheduled sooner. He has an appointment on 10/10/12. Please call. Thank you. ------

## 2012-09-26 NOTE — Telephone Encounter (Signed)
I called the pt and he is having some right sided pains since surgery.  He has no other symptoms.  I offered him an appointment this Wednesday but he works that day.  I did not have any other appointment besides 8/4 when he has an appointment.  I offered him an appointment tomorrow with one of the other surgeons.  He accepted.

## 2012-09-27 ENCOUNTER — Encounter (INDEPENDENT_AMBULATORY_CARE_PROVIDER_SITE_OTHER): Payer: Self-pay | Admitting: General Surgery

## 2012-09-27 ENCOUNTER — Ambulatory Visit (INDEPENDENT_AMBULATORY_CARE_PROVIDER_SITE_OTHER): Payer: Medicare Other | Admitting: General Surgery

## 2012-09-27 ENCOUNTER — Telehealth (INDEPENDENT_AMBULATORY_CARE_PROVIDER_SITE_OTHER): Payer: Self-pay | Admitting: General Surgery

## 2012-09-27 VITALS — BP 144/62 | HR 68 | Temp 97.8°F | Resp 20 | Ht 75.0 in | Wt 180.6 lb

## 2012-09-27 DIAGNOSIS — K810 Acute cholecystitis: Secondary | ICD-10-CM

## 2012-09-27 NOTE — Telephone Encounter (Signed)
LMOM asking pt to return my call when he received his message.  This is in regards to me giving him instruction on his CT scan.

## 2012-09-27 NOTE — Patient Instructions (Signed)
Will call with results of CT

## 2012-09-27 NOTE — Progress Notes (Signed)
Subjective:     Patient ID: Nicholas Patton, male   DOB: 01/26/1935, 78 y.o.   MRN: [id number]  HPI The patient is a 78 year old black male who is 2-1/2 months status post laparoscopic cholecystectomy by Dr. Grandville Silos. He has done well but has a persistent nagging pain in his right posterior flank area. He denies any nausea or vomiting. He denies any fevers or chills. His appetite has been good and his bowels are working normally. It hurts him mostly when he takes deep breaths.  Review of Systems     Objective:   Physical Exam On exam his abdomen is soft and nontender. His incisions are healed nicely.    Assessment:     The patient is to months status post laparoscopic cholecystectomy with persistent pain in the right flank     Plan:     At this point I would recommend a CT scan of his abdomen and pelvis to look for any complication from the surgery. We will call him with the results of the study and then proceed accordingly.

## 2012-09-28 NOTE — Telephone Encounter (Signed)
Spoke with pt and informed him that his Ct scan is on 7/24 at 11:30.  They will draw labs before hand.  Explained when to drink the contrast and that he should have no solid foods 4 hours before.  Explained it was at [address].

## 2012-09-29 ENCOUNTER — Ambulatory Visit
Admission: RE | Admit: 2012-09-29 | Discharge: 2012-09-29 | Disposition: A | Discharge status: Home / Self Care | Payer: Medicare Other | Source: Ambulatory Visit | Attending: General Surgery | Admitting: General Surgery

## 2012-09-29 DIAGNOSIS — K810 Acute cholecystitis: Secondary | ICD-10-CM

## 2012-09-29 DIAGNOSIS — R109 Unspecified abdominal pain: Secondary | ICD-10-CM | POA: Diagnosis not present

## 2012-09-29 MED ORDER — IOHEXOL 300 MG/ML  SOLN
100.0000 mL | Freq: Once | INTRAMUSCULAR | Status: AC | PRN
  Administered 2012-09-29: 100 mL via INTRAVENOUS

## 2012-10-05 ENCOUNTER — Telehealth (INDEPENDENT_AMBULATORY_CARE_PROVIDER_SITE_OTHER): Payer: Self-pay

## 2012-10-05 NOTE — Telephone Encounter (Signed)
LMOM. CT came back normal. Should start seeing improvement.

## 2012-10-06 ENCOUNTER — Telehealth (INDEPENDENT_AMBULATORY_CARE_PROVIDER_SITE_OTHER): Payer: Self-pay

## 2012-10-06 NOTE — Telephone Encounter (Signed)
Patient returning call from yesterday. Let him know CT was normal. His symptoms are better. Cancelled appt for 8/4 with Dr Grandville Silos sine he is doing better.

## 2012-10-10 ENCOUNTER — Encounter (INDEPENDENT_AMBULATORY_CARE_PROVIDER_SITE_OTHER): Payer: Medicare Other | Admitting: General Surgery

## 2012-10-12 ENCOUNTER — Other Ambulatory Visit: Payer: Self-pay

## 2012-12-16 ENCOUNTER — Ambulatory Visit (INDEPENDENT_AMBULATORY_CARE_PROVIDER_SITE_OTHER): Payer: Medicare Other | Admitting: Internal Medicine

## 2012-12-16 ENCOUNTER — Encounter: Payer: Self-pay | Admitting: Internal Medicine

## 2012-12-16 VITALS — BP 140/72 | HR 74 | Temp 97.2°F | Ht 75.0 in | Wt 184.0 lb

## 2012-12-16 DIAGNOSIS — E1142 Type 2 diabetes mellitus with diabetic polyneuropathy: Secondary | ICD-10-CM | POA: Diagnosis not present

## 2012-12-16 DIAGNOSIS — E1140 Type 2 diabetes mellitus with diabetic neuropathy, unspecified: Secondary | ICD-10-CM

## 2012-12-16 DIAGNOSIS — E119 Type 2 diabetes mellitus without complications: Secondary | ICD-10-CM | POA: Diagnosis not present

## 2012-12-16 DIAGNOSIS — E1149 Type 2 diabetes mellitus with other diabetic neurological complication: Secondary | ICD-10-CM

## 2012-12-16 DIAGNOSIS — G5790 Unspecified mononeuropathy of unspecified lower limb: Secondary | ICD-10-CM | POA: Diagnosis not present

## 2012-12-16 MED ORDER — GABAPENTIN 100 MG PO CAPS: 100.0000 mg | ORAL_CAPSULE | Freq: Every day | ORAL | Status: DC

## 2012-12-16 NOTE — Patient Instructions (Signed)

## 2012-12-16 NOTE — Assessment & Plan Note (Signed)
Well controlled Continue current medications at this time

## 2012-12-16 NOTE — Assessment & Plan Note (Signed)
Will start neurontin 100 mg QHS  Pt prefer not to take TID If no better, consider increasing med to BID

## 2012-12-16 NOTE — Progress Notes (Signed)
Subjective:    Patient ID: Nicholas Patton, male    DOB: [birth year], 78 y.o.   MRN: [id number]  HPI  Pt presents to the clinic today with c/o numbness/tingling in his feet and ankles. This started 1 week ago. He has had issues with this in the past. He does have a history of peripheral neuropathy in both feet and diabetes. He is not being treated for the neuropathy. His last A1C was 6.3% He has not taken anything OTC for it. It seems to be worse at night.   Review of Systems      Past Medical History  Diagnosis Date  . ALLERGIC RHINITIS 01/24/2007  . BENIGN PROSTATIC HYPERTROPHY 01/24/2007  . BRONCHITIS, ACUTE 06/11/2008  . Cough 01/24/2007  . DIABETES MELLITUS, TYPE II 01/24/2007  . FATIGUE 07/27/2007  . HYPERLIPIDEMIA 01/24/2007  . HYPERTENSION 10/05/2006  . INSOMNIA 06/21/2008  . LEG PAIN, BILATERAL 10/06/2007  . PERIPHERAL NEUROPATHY, FEET 10/06/2007  . PERS HX TOBACCO USE PRESENTING HAZARDS HEALTH 09/17/2009  . VITAMIN B12 DEFICIENCY 09/06/2008  . VITAMIN D DEFICIENCY 01/16/2010  . Wheezing 01/16/2010  . Cholecystitis, acute 07/22/2012  . Ileus following gastrointestinal surgery 07/22/2012  . Postoperative fever 07/22/2012  . Erectile dysfunction 09/22/2012    Current Outpatient Prescriptions  Medication Sig Dispense Refill  . acetaminophen (TYLENOL) 325 MG tablet Take 2 tablets (650 mg total) by mouth every 6 (six) hours as needed.      Marland Kitchen aspirin 81 MG EC tablet Take 81 mg by mouth daily.        Marland Kitchen atorvastatin (LIPITOR) 40 MG tablet Take 40 mg by mouth daily.      . Cholecalciferol (VITAMIN D3) 1000 UNITS CAPS Take 1 capsule by mouth daily.       Marland Kitchen glimepiride (AMARYL) 1 MG tablet Take 0.5 mg by mouth 2 (two) times daily before a meal.       . losartan-hydrochlorothiazide (HYZAAR) 100-25 MG per tablet Take 1 tablet by mouth daily.      . metFORMIN (GLUCOPHAGE) 500 MG tablet Take 1,000 mg by mouth 2 (two) times daily with a meal.      . omeprazole (PRILOSEC) 20 MG capsule Take 20 mg  by mouth daily.      . ondansetron (ZOFRAN ODT) 4 MG disintegrating tablet Take 1 tablet (4 mg total) by mouth every 8 (eight) hours as needed for nausea.  12 tablet  0   No current facility-administered medications for this visit.    No Known Allergies  Family History  Problem Relation Age of Onset  . Lung cancer Brother     History   Social History  . Marital Status: Married    Spouse Name: N/A    Number of Children: N/A  . Years of Education: N/A   Occupational History  . Not on file.   Social History Main Topics  . Smoking status: Former Research scientist (life sciences)  . Smokeless tobacco: Not on file  . Alcohol Use: No  . Drug Use: No  . Sexual Activity: Not on file   Other Topics Concern  . Not on file   Social History Narrative  . No narrative on file     Constitutional: Denies fever, malaise, fatigue, headache or abrupt weight changes.  Musculoskeletal: Denies decrease in range of motion, difficulty with gait, muscle pain or joint pain and swelling.  Skin: Denies redness, rashes, lesions or ulcercations.  Neurological: Pt reports numbness/tingling in feet. Denies dizziness, difficulty with memory, difficulty with speech or  problems with balance and coordination.   No other specific complaints in a complete review of systems (except as listed in HPI above).  Objective:   Physical Exam   BP 140/72  Pulse 74  Temp(Src) 97.2 F (36.2 C) (Oral)  Ht _0  (1.905 m)  Wt 184 lb (83.462 kg)  BMI 23 kg/m2  SpO2 95% Wt Readings from Last 3 Encounters:  12/16/12 184 lb (83.462 kg)  09/27/12 180 lb 9.6 oz (81.92 kg)  09/13/12 183 lb (83.008 kg)    General: Appears his stated age, well developed, well nourished in NAD. Skin: Warm, dry and intact. No rashes, lesions or ulcerations noted. Cardiovascular: Normal rate and rhythm. S1,S2 noted.  No murmur, rubs or gallops noted. No JVD or BLE edema. No carotid bruits noted. Pulmonary/Chest: Normal effort and positive vesicular breath  sounds. No respiratory distress. No wheezes, rales or ronchi noted.  Musculoskeletal: Normal range of motion. No signs of joint swelling. No difficulty with gait.  Neurological: Alert and oriented. Cranial nerves II-XII intact. Coordination normal. +DTRs bilaterally. Sensation decreased to 10 gm monofilament.   BMET    Component Value Date/Time   NA 136 07/20/2012 0520   K 4.1 07/20/2012 0520   CL 104 07/20/2012 0520   CO2 26 07/20/2012 0520   GLUCOSE 120* 07/20/2012 0520   BUN 17 07/20/2012 0520   CREATININE 1.21 07/20/2012 0520   CALCIUM 8.1* 07/20/2012 0520   GFRNONAA 56* 07/20/2012 0520   GFRAA 65* 07/20/2012 0520    Lipid Panel     Component Value Date/Time   CHOL 122 09/13/2012 1531   TRIG 174.0* 09/13/2012 1531   HDL 49.00 09/13/2012 1531   CHOLHDL 2 09/13/2012 1531   VLDL 34.8 09/13/2012 1531   LDLCALC 38 09/13/2012 1531    CBC    Component Value Date/Time   WBC 5.1 07/21/2012 0450   RBC 3.43* 07/21/2012 0450   HGB 9.9* 07/21/2012 0450   HCT 29.6* 07/21/2012 0450   PLT 167 07/21/2012 0450   MCV 86.3 07/21/2012 0450   MCH 28.9 07/21/2012 0450   MCHC 33.4 07/21/2012 0450   RDW 14.2 07/21/2012 0450   LYMPHSABS 0.7 07/17/2012 2119   MONOABS 0.7 07/17/2012 2119   EOSABS 0.0 07/17/2012 2119   BASOSABS 0.0 07/17/2012 2119    Hgb A1C Lab Results  Component Value Date   HGBA1C 6.3* 07/18/2012        Assessment & Plan:

## 2012-12-20 ENCOUNTER — Encounter (HOSPITAL_COMMUNITY): Payer: Self-pay | Admitting: Emergency Medicine

## 2012-12-20 ENCOUNTER — Emergency Department (HOSPITAL_COMMUNITY)
Admission: EM | Admit: 2012-12-20 | Discharge: 2012-12-20 | Disposition: A | Discharge status: Home / Self Care | Payer: Medicare Other | Attending: Emergency Medicine | Admitting: Emergency Medicine

## 2012-12-20 ENCOUNTER — Emergency Department (HOSPITAL_COMMUNITY): Payer: Medicare Other

## 2012-12-20 DIAGNOSIS — Z87891 Personal history of nicotine dependence: Secondary | ICD-10-CM | POA: Diagnosis not present

## 2012-12-20 DIAGNOSIS — Z8719 Personal history of other diseases of the digestive system: Secondary | ICD-10-CM | POA: Insufficient documentation

## 2012-12-20 DIAGNOSIS — Z7982 Long term (current) use of aspirin: Secondary | ICD-10-CM | POA: Insufficient documentation

## 2012-12-20 DIAGNOSIS — E538 Deficiency of other specified B group vitamins: Secondary | ICD-10-CM | POA: Diagnosis not present

## 2012-12-20 DIAGNOSIS — E119 Type 2 diabetes mellitus without complications: Secondary | ICD-10-CM | POA: Diagnosis not present

## 2012-12-20 DIAGNOSIS — R42 Dizziness and giddiness: Secondary | ICD-10-CM | POA: Diagnosis not present

## 2012-12-20 DIAGNOSIS — R519 Headache, unspecified: Secondary | ICD-10-CM

## 2012-12-20 DIAGNOSIS — R51 Headache: Secondary | ICD-10-CM | POA: Diagnosis not present

## 2012-12-20 DIAGNOSIS — Z79899 Other long term (current) drug therapy: Secondary | ICD-10-CM | POA: Insufficient documentation

## 2012-12-20 DIAGNOSIS — N400 Enlarged prostate without lower urinary tract symptoms: Secondary | ICD-10-CM | POA: Insufficient documentation

## 2012-12-20 DIAGNOSIS — J309 Allergic rhinitis, unspecified: Secondary | ICD-10-CM | POA: Insufficient documentation

## 2012-12-20 DIAGNOSIS — I10 Essential (primary) hypertension: Secondary | ICD-10-CM | POA: Diagnosis not present

## 2012-12-20 DIAGNOSIS — G4700 Insomnia, unspecified: Secondary | ICD-10-CM | POA: Diagnosis not present

## 2012-12-20 DIAGNOSIS — N529 Male erectile dysfunction, unspecified: Secondary | ICD-10-CM | POA: Diagnosis not present

## 2012-12-20 DIAGNOSIS — E559 Vitamin D deficiency, unspecified: Secondary | ICD-10-CM | POA: Insufficient documentation

## 2012-12-20 DIAGNOSIS — G609 Hereditary and idiopathic neuropathy, unspecified: Secondary | ICD-10-CM | POA: Insufficient documentation

## 2012-12-20 DIAGNOSIS — Z8709 Personal history of other diseases of the respiratory system: Secondary | ICD-10-CM | POA: Diagnosis not present

## 2012-12-20 DIAGNOSIS — E785 Hyperlipidemia, unspecified: Secondary | ICD-10-CM | POA: Diagnosis not present

## 2012-12-20 DIAGNOSIS — R404 Transient alteration of awareness: Secondary | ICD-10-CM | POA: Diagnosis not present

## 2012-12-20 HISTORY — DX: Polyneuropathy, unspecified: G62.9

## 2012-12-20 LAB — URINALYSIS, ROUTINE W REFLEX MICROSCOPIC
Bilirubin Urine: NEGATIVE
Glucose, UA: NEGATIVE mg/dL
Hgb urine dipstick: NEGATIVE
Ketones, ur: NEGATIVE mg/dL
Leukocytes, UA: NEGATIVE
Nitrite: NEGATIVE
Protein, ur: NEGATIVE mg/dL
Specific Gravity, Urine: 1.013 (ref 1.005–1.030)
Urobilinogen, UA: 0.2 mg/dL (ref 0.0–1.0)
pH: 6.5 (ref 5.0–8.0)

## 2012-12-20 LAB — BASIC METABOLIC PANEL
BUN: 18 mg/dL (ref 6–23)
CO2: 25 mEq/L (ref 19–32)
Calcium: 9.7 mg/dL (ref 8.4–10.5)
Chloride: 101 mEq/L (ref 96–112)
Creatinine, Ser: 1.11 mg/dL (ref 0.50–1.35)
GFR calc Af Amer: 73 mL/min — ABNORMAL LOW (ref >90)
GFR calc non Af Amer: 63 mL/min — ABNORMAL LOW (ref >90)
Glucose, Bld: 111 mg/dL — ABNORMAL HIGH (ref 70–99)
Potassium: 4.1 mEq/L (ref 3.5–5.1)
Sodium: 138 mEq/L (ref 135–145)

## 2012-12-20 LAB — CBC WITH DIFFERENTIAL/PLATELET
Basophils Absolute: 0 10*3/uL (ref 0.0–0.1)
Basophils Relative: 1 % (ref 0–1)
Eosinophils Absolute: 0.1 10*3/uL (ref 0.0–0.7)
Eosinophils Relative: 2 % (ref 0–5)
HCT: 36.9 % — ABNORMAL LOW (ref 39.0–52.0)
Hemoglobin: 12.4 g/dL — ABNORMAL LOW (ref 13.0–17.0)
Lymphocytes Relative: 26 % (ref 12–46)
Lymphs Abs: 1.1 10*3/uL (ref 0.7–4.0)
MCH: 28.1 pg (ref 26.0–34.0)
MCHC: 33.6 g/dL (ref 30.0–36.0)
MCV: 83.7 fL (ref 78.0–100.0)
Monocytes Absolute: 0.3 10*3/uL (ref 0.1–1.0)
Monocytes Relative: 7 % (ref 3–12)
Neutro Abs: 2.7 10*3/uL (ref 1.7–7.7)
Neutrophils Relative %: 65 % (ref 43–77)
Platelets: 211 10*3/uL (ref 150–400)
RBC: 4.41 MIL/uL (ref 4.22–5.81)
RDW: 14.5 % (ref 11.5–15.5)
WBC: 4.2 10*3/uL (ref 4.0–10.5)

## 2012-12-20 MED ORDER — TRAMADOL HCL 50 MG PO TABS: 50.0000 mg | ORAL_TABLET | Freq: Four times a day (QID) | ORAL | Status: DC | PRN

## 2012-12-20 MED ORDER — MECLIZINE HCL 25 MG PO TABS: 25.0000 mg | ORAL_TABLET | Freq: Three times a day (TID) | ORAL | Status: DC | PRN

## 2012-12-20 MED ORDER — MECLIZINE HCL 25 MG PO TABS
25.0000 mg | ORAL_TABLET | Freq: Once | ORAL | Status: AC
  Administered 2012-12-20: 25 mg via ORAL
  Filled 2012-12-20: qty 1

## 2012-12-20 MED ORDER — TRAMADOL HCL 50 MG PO TABS
50.0000 mg | ORAL_TABLET | Freq: Once | ORAL | Status: AC
  Administered 2012-12-20: 50 mg via ORAL
  Filled 2012-12-20: qty 1

## 2012-12-20 NOTE — ED Notes (Signed)
Pt arrived by [facility]. Having dizziness episodes that started 3 days ago, along with headaches. Became more severe thsi am. cbg 114

## 2012-12-20 NOTE — ED Notes (Signed)
Returned from [employer] and placed back on monitor.

## 2012-12-20 NOTE — ED Notes (Signed)
Patient transported to MRI 

## 2012-12-20 NOTE — ED Notes (Signed)
No adverse signs from medication noted. Patient wheeled out by family.

## 2012-12-20 NOTE — ED Notes (Signed)
Pt being observed for reaction to medication.

## 2012-12-20 NOTE — ED Provider Notes (Signed)
CSN: [id number]     Arrival date & time 12/20/12  0913 History   First MD Initiated Contact with Patient 12/20/12 [phone]     Chief Complaint  Patient presents with  . Dizziness   (Consider location/radiation/quality/duration/timing/severity/associated sxs/prior Treatment) HPI Comments: Patient is a 78 year old male with a past medical history of diabetes, hypertension, hyperlipidemia who presents with a 3 day history of intermittent dizziness. Symptoms have been intermittent without known trigger. The dizziness is described as room-spinning. No aggravating/alleviating factors. The episodes will last about 15 minutes at a time. Patient reports some associated mild headaches. Patient denies head trauma, taking blood thinners.    Past Medical History  Diagnosis Date  . ALLERGIC RHINITIS 01/24/2007  . BENIGN PROSTATIC HYPERTROPHY 01/24/2007  . BRONCHITIS, ACUTE 06/11/2008  . Cough 01/24/2007  . DIABETES MELLITUS, TYPE II 01/24/2007  . FATIGUE 07/27/2007  . HYPERLIPIDEMIA 01/24/2007  . HYPERTENSION 10/05/2006  . INSOMNIA 06/21/2008  . LEG PAIN, BILATERAL 10/06/2007  . PERIPHERAL NEUROPATHY, FEET 10/06/2007  . PERS HX TOBACCO USE PRESENTING HAZARDS HEALTH 09/17/2009  . VITAMIN B12 DEFICIENCY 09/06/2008  . VITAMIN D DEFICIENCY 01/16/2010  . Wheezing 01/16/2010  . Cholecystitis, acute 07/22/2012  . Ileus following gastrointestinal surgery 07/22/2012  . Postoperative fever 07/22/2012  . Erectile dysfunction 09/22/2012  . Peripheral neuropathy    Past Surgical History  Procedure Laterality Date  . Total knee arthroplasty  2001    Left  . Turp vaporization    . Cholecystectomy N/A 07/18/2012    Procedure: LAPAROSCOPIC CHOLECYSTECTOMY WITH INTRAOPERATIVE CHOLANGIOGRAM;  Surgeon: Zenovia Jarred, MD;  Location: [facility];  Service: General;  Laterality: N/A;   Family History  Problem Relation Age of Onset  . Lung cancer Brother    History  Substance Use Topics  . Smoking status: Former Research scientist (life sciences)  .  Smokeless tobacco: Not on file  . Alcohol Use: No    Review of Systems  Neurological: Positive for dizziness and headaches.  All other systems reviewed and are negative.    Allergies  Review of patient's allergies indicates no known allergies.  Home Medications   Current Outpatient Rx  Name  Route  Sig  Dispense  Refill  . aspirin 81 MG EC tablet   Oral   Take 81 mg by mouth daily.           Marland Kitchen atorvastatin (LIPITOR) 40 MG tablet   Oral   Take 40 mg by mouth daily.         . Cholecalciferol (VITAMIN D3) 1000 UNITS CAPS   Oral   Take 1 capsule by mouth daily.          Marland Kitchen glimepiride (AMARYL) 1 MG tablet   Oral   Take 0.5 mg by mouth 2 (two) times daily before a meal.          . losartan-hydrochlorothiazide (HYZAAR) 100-25 MG per tablet   Oral   Take 1 tablet by mouth daily.         . metFORMIN (GLUCOPHAGE) 500 MG tablet   Oral   Take 1,000 mg by mouth 2 (two) times daily with a meal.         . Multiple Vitamin (MULTIVITAMIN WITH MINERALS) TABS tablet   Oral   Take 1 tablet by mouth daily.         Marland Kitchen omeprazole (PRILOSEC) 20 MG capsule   Oral   Take 20 mg by mouth daily.         Marland Kitchen gabapentin (  NEURONTIN) 100 MG capsule   Oral   Take 1 capsule (100 mg total) by mouth at bedtime.   30 capsule   0    BP 131/59  Pulse 51  Temp(Src) 98.1 F (36.7 C) (Oral)  Resp 10  SpO2 100% Physical Exam  Nursing note and vitals reviewed. Constitutional: He is oriented to person, place, and time. He appears well-developed and well-nourished. No distress.  HENT:  Head: Normocephalic and atraumatic.  Mouth/Throat: Oropharynx is clear and moist. No oropharyngeal exudate.  Eyes: Conjunctivae and EOM are normal. Pupils are equal, round, and reactive to light. No scleral icterus.  Neck: Normal range of motion.  Cardiovascular: Normal rate and regular rhythm.  Exam reveals no gallop and no friction rub.   No murmur heard. Pulmonary/Chest: Effort normal and  breath sounds normal. He has no wheezes. He has no rales. He exhibits no tenderness.  Abdominal: Soft. He exhibits no distension. There is no tenderness. There is no rebound and no guarding.  Musculoskeletal: Normal range of motion.  Neurological: He is alert and oriented to person, place, and time. No cranial nerve deficit. Coordination normal.  Extremity strength and sensation equal and intact bilaterally. Speech is goal-oriented. Moves limbs without ataxia. Patient able to stand but does endorse some dizziness.   Skin: Skin is warm and dry.  Psychiatric: He has a normal mood and affect. His behavior is normal.    ED Course  Procedures (including critical care time) Labs Review Labs Reviewed  CBC WITH DIFFERENTIAL - Abnormal; Notable for the following:    Hemoglobin 12.4 (*)    HCT 36.9 (*)    All other components within normal limits  BASIC METABOLIC PANEL - Abnormal; Notable for the following:    Glucose, Bld 111 (*)    GFR calc non Af Amer 63 (*)    GFR calc Af Amer 73 (*)    All other components within normal limits  URINE CULTURE  URINALYSIS, ROUTINE W REFLEX MICROSCOPIC   Imaging Review Ct Head Wo Contrast  12/20/2012   CLINICAL DATA:  Dizziness started 3 days ago along with headache.  EXAM: CT HEAD WITHOUT CONTRAST  TECHNIQUE: Contiguous axial images were obtained from the base of the skull through the vertex without intravenous contrast.  COMPARISON:  04/15/2012 CT.  FINDINGS: No intracranial hemorrhage.  No CT evidence of large acute infarct. Streak artifact slightly limits evaluation posterior fossa structures.  No hydrocephalus.  No intracranial mass lesion noted on this unenhanced exam.  Calcification carotid artery cavernous segment.  IMPRESSION: No intracranial hemorrhage or CT evidence of large acute infarct as noted above.   Electronically Signed   By: Chauncey Cruel M.D.   On: 12/20/2012 10:11    EKG Interpretation   None       MDM   1. Vertigo   2. Headache      10:56 AM Labs and CT head unremarkable for acute changes. Patient will have MRI to rule out infarct. No neuro deficits noted on exam. Vitals stable and patient afebrile. Patient reports some relief with Meclizine.  MRI shows no area of infarct. Patient will be discharged with Meclizine and Tramadol and instructed to follow up with his PCP. Patient instructed to return with worsening or concerning symptoms.     Alvina Chou, PA-C 12/20/12 1428

## 2012-12-21 LAB — URINE CULTURE
Colony Count: NO GROWTH
Culture: NO GROWTH
Special Requests: NORMAL

## 2012-12-23 NOTE — ED Provider Notes (Signed)
Medical screening examination/treatment/procedure(s) were performed by non-physician practitioner and as supervising physician I was immediately available for consultation/collaboration.   Alfonzo Feller, DO 12/23/12 1318

## 2012-12-29 ENCOUNTER — Encounter: Payer: Self-pay | Admitting: Internal Medicine

## 2012-12-29 ENCOUNTER — Ambulatory Visit (INDEPENDENT_AMBULATORY_CARE_PROVIDER_SITE_OTHER): Payer: Medicare Other | Admitting: Internal Medicine

## 2012-12-29 VITALS — BP 132/78 | HR 97 | Temp 98.2°F | Ht 75.0 in | Wt 181.5 lb

## 2012-12-29 DIAGNOSIS — E119 Type 2 diabetes mellitus without complications: Secondary | ICD-10-CM | POA: Diagnosis not present

## 2012-12-29 DIAGNOSIS — R42 Dizziness and giddiness: Secondary | ICD-10-CM | POA: Insufficient documentation

## 2012-12-29 DIAGNOSIS — J069 Acute upper respiratory infection, unspecified: Secondary | ICD-10-CM

## 2012-12-29 DIAGNOSIS — J309 Allergic rhinitis, unspecified: Secondary | ICD-10-CM

## 2012-12-29 MED ORDER — ONETOUCH ULTRA SYSTEM W/DEVICE KIT: 1.0000 | PACK | Freq: Once | Status: DC

## 2012-12-29 MED ORDER — HYDROCODONE-HOMATROPINE 5-1.5 MG/5ML PO SYRP: 5.0000 mL | ORAL_SOLUTION | Freq: Four times a day (QID) | ORAL | Status: DC | PRN

## 2012-12-29 MED ORDER — FLUTICASONE PROPIONATE 50 MCG/ACT NA SUSP: 2.0000 | Freq: Every day | NASAL | Status: DC

## 2012-12-29 NOTE — Progress Notes (Addendum)
Subjective:    Patient ID: Nicholas Patton, male    DOB: 1934/11/03, 78 y.o.   MRN: [id number]  HPI  Here to f/u post ER visit for vertigo now resolved,.  Had recent URi symtpoms improved, still with nonprod cough that keeps him up at night, but Pt denies chest pain, increased sob or doe, wheezing, orthopnea, PND, increased LE swelling, palpitations, dizziness or syncope.  Does have several wks ongoing nasal allergy symptoms with clearish congestion, itch and sneezing, without fever, pain, ST, cough, swelling or wheezing.    Pt denies polydipsia, polyuria, or low sugar symptoms such as weakness or confusion improved with po intake.  Pt states overall good compliance with meds, trying to follow lower cholesterol, diabetic diet, wt overall stable but little exercise however.    Past Medical History  Diagnosis Date  . ALLERGIC RHINITIS 01/24/2007  . BENIGN PROSTATIC HYPERTROPHY 01/24/2007  . BRONCHITIS, ACUTE 06/11/2008  . Cough 01/24/2007  . DIABETES MELLITUS, TYPE II 01/24/2007  . FATIGUE 07/27/2007  . HYPERLIPIDEMIA 01/24/2007  . HYPERTENSION 10/05/2006  . INSOMNIA 06/21/2008  . LEG PAIN, BILATERAL 10/06/2007  . PERIPHERAL NEUROPATHY, FEET 10/06/2007  . PERS HX TOBACCO USE PRESENTING HAZARDS HEALTH 09/17/2009  . VITAMIN B12 DEFICIENCY 09/06/2008  . VITAMIN D DEFICIENCY 01/16/2010  . Wheezing 01/16/2010  . Cholecystitis, acute 07/22/2012  . Ileus following gastrointestinal surgery 07/22/2012  . Postoperative fever 07/22/2012  . Erectile dysfunction 09/22/2012  . Peripheral neuropathy    Past Surgical History  Procedure Laterality Date  . Total knee arthroplasty  2001    Left  . Turp vaporization    . Cholecystectomy N/A 07/18/2012    Procedure: LAPAROSCOPIC CHOLECYSTECTOMY WITH INTRAOPERATIVE CHOLANGIOGRAM;  Surgeon: Zenovia Jarred, MD;  Location: [place];  Service: General;  Laterality: N/A;    reports that he has quit smoking. He does not have any smokeless tobacco history on file. He reports  that he does not drink alcohol or use illicit drugs. family history includes Lung cancer in his brother. No Known Allergies Current Outpatient Prescriptions on File Prior to Visit  Medication Sig Dispense Refill  . aspirin 81 MG EC tablet Take 81 mg by mouth daily.        Marland Kitchen atorvastatin (LIPITOR) 40 MG tablet Take 40 mg by mouth daily.      . Cholecalciferol (VITAMIN D3) 1000 UNITS CAPS Take 1 capsule by mouth daily.       Marland Kitchen gabapentin (NEURONTIN) 100 MG capsule Take 1 capsule (100 mg total) by mouth at bedtime.  30 capsule  0  . glimepiride (AMARYL) 1 MG tablet Take 0.5 mg by mouth 2 (two) times daily before a meal.       . losartan-hydrochlorothiazide (HYZAAR) 100-25 MG per tablet Take 1 tablet by mouth daily.      . meclizine (ANTIVERT) 25 MG tablet Take 1 tablet (25 mg total) by mouth 3 (three) times daily as needed.  30 tablet  0  . metFORMIN (GLUCOPHAGE) 500 MG tablet Take 1,000 mg by mouth 2 (two) times daily with a meal.      . Multiple Vitamin (MULTIVITAMIN WITH MINERALS) TABS tablet Take 1 tablet by mouth daily.      Marland Kitchen omeprazole (PRILOSEC) 20 MG capsule Take 20 mg by mouth daily.      . traMADol (ULTRAM) 50 MG tablet Take 1 tablet (50 mg total) by mouth every 6 (six) hours as needed for pain.  15 tablet  0   No current facility-administered  medications on file prior to visit.   Review of Systems  Constitutional: Negative for unexpected weight change, or unusual diaphoresis  HENT: Negative for tinnitus.   Eyes: Negative for photophobia and visual disturbance.  Respiratory: Negative for choking and stridor.   Gastrointestinal: Negative for vomiting and blood in stool.  Genitourinary: Negative for hematuria and decreased urine volume.  Musculoskeletal: Negative for acute joint swelling Skin: Negative for color change and wound.  Neurological: Negative for tremors and numbness other than noted  Psychiatric/Behavioral: Negative for decreased concentration or  hyperactivity.        Objective:   Physical Exam BP 132/78  Pulse 97  Temp(Src) 98.2 F (36.8 C) (Oral)  Ht _0  (1.905 m)  Wt 181 lb 8 oz (82.328 kg)  BMI 22.69 kg/m2  SpO2 97% VS noted,  Constitutional: Pt appears well-developed and well-nourished.  HENT: Head: NCAT.  Right Ear: External ear normal.  Left Ear: External ear normal.  Eyes: Conjunctivae and EOM are normal. Pupils are equal, round, and reactive to light.  Neck: Normal range of motion. Neck supple.  Cardiovascular: Normal rate and regular rhythm.   Pulmonary/Chest: Effort normal and breath sounds normal.  - no rales or wheezing Abd:  Soft, NT, non-distended, + BS Neurological: Pt is alert. Not confused , motor 5/5, FTN normal Skin: Skin is warm. No erythema.  Psychiatric: Pt behavior is normal. Thought content normal.     Assessment & Plan:  Quality Measures addressed:  Pneumonia Vaccine: pt declines, may self-refer to local pharmacy Diabetes Blood Pressure < 140/90: pt declines further medication change at this time

## 2012-12-29 NOTE — Patient Instructions (Signed)
Please take all new medication as prescribed - the cough medicine Please continue all other medications as before, and refills have been done if requested - the flonase Your Onetouch Glucometer prescription was also sent to the pharmacy Please have the pharmacy call with any other refills you may need.  Please remember to sign up for My Chart if you have not done so, as this will be important to you in the future with finding out test results, communicating by private email, and scheduling acute appointments online when needed.

## 2013-01-01 NOTE — Assessment & Plan Note (Signed)
Resolved,  to f/u any worsening symptoms or concerns

## 2013-01-01 NOTE — Assessment & Plan Note (Signed)
stable overall by history and exam, recent data reviewed with pt, and pt to continue medical treatment as before,  to f/u any worsening symptoms or concerns Lab Results  Component Value Date   HGBA1C 6.3* 07/18/2012

## 2013-01-01 NOTE — Assessment & Plan Note (Signed)
Mild, resolving, likely viral, for symptomatic otc tx,  to f/u any worsening symptoms or concerns

## 2013-01-01 NOTE — Assessment & Plan Note (Signed)
To re-start the flonase asd,  to f/u any worsening symptoms or concerns

## 2013-01-02 ENCOUNTER — Telehealth: Payer: Self-pay | Admitting: Internal Medicine

## 2013-01-02 NOTE — Telephone Encounter (Signed)
01/02/2013  Pt left message requesting a 1 Touch Ultra 2 and also a script for test strips.  Pt contact # [phone]

## 2013-01-03 MED ORDER — GLUCOSE BLOOD VI STRP: ORAL_STRIP | Status: DC

## 2013-01-12 ENCOUNTER — Other Ambulatory Visit: Payer: Self-pay

## 2013-01-24 ENCOUNTER — Ambulatory Visit (INDEPENDENT_AMBULATORY_CARE_PROVIDER_SITE_OTHER): Payer: Medicare Other

## 2013-01-24 DIAGNOSIS — Z23 Encounter for immunization: Secondary | ICD-10-CM | POA: Diagnosis not present

## 2013-02-09 ENCOUNTER — Ambulatory Visit (INDEPENDENT_AMBULATORY_CARE_PROVIDER_SITE_OTHER): Payer: Medicare Other | Admitting: Internal Medicine

## 2013-02-09 ENCOUNTER — Ambulatory Visit (INDEPENDENT_AMBULATORY_CARE_PROVIDER_SITE_OTHER)
Admission: RE | Admit: 2013-02-09 | Discharge: 2013-02-09 | Disposition: A | Discharge status: Home / Self Care | Payer: Medicare Other | Source: Ambulatory Visit | Attending: Internal Medicine | Admitting: Internal Medicine

## 2013-02-09 ENCOUNTER — Telehealth: Payer: Self-pay | Admitting: Internal Medicine

## 2013-02-09 ENCOUNTER — Encounter: Payer: Self-pay | Admitting: Internal Medicine

## 2013-02-09 VITALS — BP 152/64 | HR 100 | Temp 98.2°F | Ht 75.0 in | Wt 185.4 lb

## 2013-02-09 DIAGNOSIS — K1370 Unspecified lesions of oral mucosa: Secondary | ICD-10-CM

## 2013-02-09 DIAGNOSIS — K1379 Other lesions of oral mucosa: Secondary | ICD-10-CM | POA: Insufficient documentation

## 2013-02-09 DIAGNOSIS — J40 Bronchitis, not specified as acute or chronic: Secondary | ICD-10-CM | POA: Diagnosis not present

## 2013-02-09 NOTE — Progress Notes (Signed)
Subjective:    Patient ID: Nicholas Patton, male    DOB: 02-Aug-1934, 79 y.o.   MRN: [id number]  HPI  Here after noting blood in the mouth in the setting of chronic AM cough and denture use as well the last 3 mornings;  Had flu shot 2 wks ago, and quit smoking approx 30 yrs ago.  No signficant ETOH use.  Pt denies chest pain, increased sob or doe, wheezing, orthopnea, PND, increased LE swelling, palpitations, dizziness or syncope except for the above.  Past Medical History  Diagnosis Date  . ALLERGIC RHINITIS 01/24/2007  . BENIGN PROSTATIC HYPERTROPHY 01/24/2007  . BRONCHITIS, ACUTE 06/11/2008  . Cough 01/24/2007  . DIABETES MELLITUS, TYPE II 01/24/2007  . FATIGUE 07/27/2007  . HYPERLIPIDEMIA 01/24/2007  . HYPERTENSION 10/05/2006  . INSOMNIA 06/21/2008  . LEG PAIN, BILATERAL 10/06/2007  . PERIPHERAL NEUROPATHY, FEET 10/06/2007  . PERS HX TOBACCO USE PRESENTING HAZARDS HEALTH 09/17/2009  . VITAMIN B12 DEFICIENCY 09/06/2008  . VITAMIN D DEFICIENCY 01/16/2010  . Wheezing 01/16/2010  . Cholecystitis, acute 07/22/2012  . Ileus following gastrointestinal surgery 07/22/2012  . Postoperative fever 07/22/2012  . Erectile dysfunction 09/22/2012  . Peripheral neuropathy    Past Surgical History  Procedure Laterality Date  . Total knee arthroplasty  2001    Left  . Turp vaporization    . Cholecystectomy N/A 07/18/2012    Procedure: LAPAROSCOPIC CHOLECYSTECTOMY WITH INTRAOPERATIVE CHOLANGIOGRAM;  Surgeon: Zenovia Jarred, MD;  Location: [place];  Service: General;  Laterality: N/A;    reports that he has quit smoking. He does not have any smokeless tobacco history on file. He reports that he does not drink alcohol or use illicit drugs. family history includes Lung cancer in his brother. No Known Allergies Current Outpatient Prescriptions on File Prior to Visit  Medication Sig Dispense Refill  . aspirin 81 MG EC tablet Take 81 mg by mouth daily.        Marland Kitchen atorvastatin (LIPITOR) 40 MG tablet Take 40 mg by  mouth daily.      . Blood Glucose Monitoring Suppl (ONE TOUCH ULTRA SYSTEM KIT) W/DEVICE KIT 1 kit by Does not apply route once.  1 each  0  . Cholecalciferol (VITAMIN D3) 1000 UNITS CAPS Take 1 capsule by mouth daily.       . fluticasone (FLONASE) 50 MCG/ACT nasal spray Place 2 sprays into the nose daily.  16 g  2  . gabapentin (NEURONTIN) 100 MG capsule Take 1 capsule (100 mg total) by mouth at bedtime.  30 capsule  0  . glimepiride (AMARYL) 1 MG tablet Take 0.5 mg by mouth 2 (two) times daily before a meal.       . glucose blood (ONE TOUCH TEST STRIPS) test strip Use as directed once daily, diagnosis code 250.00  100 each  11  . HYDROcodone-homatropine (HYCODAN) 5-1.5 MG/5ML syrup Take 5 mLs by mouth every 6 (six) hours as needed for cough.  180 mL  0  . losartan-hydrochlorothiazide (HYZAAR) 100-25 MG per tablet Take 1 tablet by mouth daily.      . meclizine (ANTIVERT) 25 MG tablet Take 1 tablet (25 mg total) by mouth 3 (three) times daily as needed.  30 tablet  0  . metFORMIN (GLUCOPHAGE) 500 MG tablet Take 1,000 mg by mouth 2 (two) times daily with a meal.      . Multiple Vitamin (MULTIVITAMIN WITH MINERALS) TABS tablet Take 1 tablet by mouth daily.      Marland Kitchen  omeprazole (PRILOSEC) 20 MG capsule Take 20 mg by mouth daily.      . traMADol (ULTRAM) 50 MG tablet Take 1 tablet (50 mg total) by mouth every 6 (six) hours as needed for pain.  15 tablet  0   No current facility-administered medications on file prior to visit.   Review of Systems  Constitutional: Negative for unexpected weight change, or unusual diaphoresis  HENT: Negative for tinnitus.   Eyes: Negative for photophobia and visual disturbance.  Respiratory: Negative for choking and stridor.   Gastrointestinal: Negative for vomiting and blood in stool.  Genitourinary: Negative for hematuria and decreased urine volume.  Musculoskeletal: Negative for acute joint swelling Skin: Negative for color change and wound.  Neurological:  Negative for tremors and numbness other than noted  Psychiatric/Behavioral: Negative for decreased concentration or  hyperactivity.       Objective:   Physical Exam BP 152/64  Pulse 100  Temp(Src) 98.2 F (36.8 C) (Oral)  Ht _0  (1.905 m)  Wt 185 lb 6 oz (84.086 kg)  BMI 23.17 kg/m2  SpO2 97% VS noted,  Constitutional: Pt appears well-developed and well-nourished.  HENT: Head: NCAT.  Right Ear: External ear normal.  Left Ear: External ear normal.  Eyes: Conjunctivae and EOM are normal. Pupils are equal, round, and reactive to light.  Mouth: no dentures today above or below; has a ? Irritated 8 mm nodular pink lesion to mid hard palate; no other masses Neck: Normal range of motion. Neck supple. No LA or masses Cardiovascular: Normal rate and regular rhythm.   Pulmonary/Chest: Effort normal and breath sounds normal.  Abd:  Soft, NT, non-distended, + BS - no HSM Neurological: Pt is alert. Not confused  Skin: Skin is warm. No erythema.  Psychiatric: Pt behavior is normal. Thought content normal.     Assessment & Plan:

## 2013-02-09 NOTE — Telephone Encounter (Signed)
When Mr. Zukas has awakened for the last 2 days he had bright red blood in his mouth.  He thought it was from nose bleeds, but none in nose.  The wears dentures, but not coming from roof of mouth.  No appts today.  Could he be worked in?

## 2013-02-09 NOTE — Telephone Encounter (Signed)
See other note, not sure I sent it to you

## 2013-02-09 NOTE — Telephone Encounter (Signed)
[place] with me, he will need exam and cxr most likely

## 2013-02-09 NOTE — Assessment & Plan Note (Signed)
Overall I suspect blood may be related to ? iritatoin to roof of mouth, but cant r/o malignancy ENT, or even hemoptysis with occas and ongoing cough;  For cxr today, also ENT referral, to avoid denture use for now

## 2013-02-09 NOTE — Progress Notes (Signed)
Pre-visit discussion using our clinic review tool. No additional management support is needed unless otherwise documented below in the visit note.  

## 2013-02-09 NOTE — Patient Instructions (Signed)
Please continue all other medications as before, and refills have been done if requested. Please have the pharmacy call with any other refills you may need.  Please go to the XRAY Department in the Basement (go straight as you get off the elevator) for the x-ray testing  You will be contacted by phone if any changes need to be made immediately.  Otherwise, you will receive a letter about your results with an explanation, but please check with MyChart first.  You will be contacted regarding the referral for: ENT - to see [facility] today - to be seen asap (but not necessarily "stat")

## 2013-02-09 NOTE — Telephone Encounter (Signed)
Scheduled at 3:00 today. Didn't see another note.

## 2013-02-10 DIAGNOSIS — R042 Hemoptysis: Secondary | ICD-10-CM | POA: Diagnosis not present

## 2013-03-20 ENCOUNTER — Telehealth: Payer: Self-pay | Admitting: Internal Medicine

## 2013-03-20 NOTE — Telephone Encounter (Signed)
Patient Information:  Caller Name: Nicholas Patton  Phone: [phone]  Patient: Nicholas Patton, Nicholas Patton  Gender: Male  DOB: Aug 21, 1934  Age: 79 Years  PCP: Cathlean Cower (Adults only)  Office Follow Up:  Does the office need to follow up with this patient?: No  Instructions For The Office: N/A  RN Note:  Instructed not to take antibiotic unless advised by MD; antibiotic teaching done;  pt triaged and homecare disposition recommended; if sx worsen call back; call back instructions reviewed; see MD in office as previously scheduled on 03/22/13; pt will comply  Symptoms  Reason For Call & Symptoms: Pt states that every year he seems to get bronchitis and takes  hydrocodone  cough syrup; he has some Azithromycin at home and wants to know if he can start this antibiotic that was [place] given to him in March; sx started 03/17/13; sx include cough especially in the cold air; denies SOB or chest pain; no fever;  Reviewed Health History In EMR: Yes  Reviewed Medications In EMR: Yes  Reviewed Allergies In EMR: Yes  Reviewed Surgeries / Procedures: Yes  Date of Onset of Symptoms: 03/17/2013  Treatments Tried: Cough medicine as ordered by MD in October 2014  Treatments Tried Worked: No  Guideline(s) Used:  Cough  Disposition Per Guideline:   Home Care  Reason For Disposition Reached:   Cough with no complications  Advice Given:  OTC Cough Syrup - Dextromethorphan:  Cough syrups containing the cough suppressant dextromethorphan (DM) may help decrease your cough. Cough syrups work best for coughs that keep you awake at night. They can also sometimes help in the late stages of a respiratory infection when the cough is dry and hacking. They can be used along with cough drops.  Call Back If:  Difficulty breathing  Fever lasts > 3 days  You become worse.  Patient Will Follow Care Advice:  YES

## 2013-03-21 ENCOUNTER — Ambulatory Visit: Payer: Medicare Other | Admitting: Internal Medicine

## 2013-03-22 ENCOUNTER — Encounter: Payer: Self-pay | Admitting: Internal Medicine

## 2013-03-22 ENCOUNTER — Other Ambulatory Visit (INDEPENDENT_AMBULATORY_CARE_PROVIDER_SITE_OTHER): Payer: Medicare Other

## 2013-03-22 ENCOUNTER — Ambulatory Visit (INDEPENDENT_AMBULATORY_CARE_PROVIDER_SITE_OTHER): Payer: Medicare Other | Admitting: Internal Medicine

## 2013-03-22 VITALS — BP 134/60 | HR 96 | Temp 98.0°F | Ht 75.0 in | Wt 182.1 lb

## 2013-03-22 DIAGNOSIS — E119 Type 2 diabetes mellitus without complications: Secondary | ICD-10-CM

## 2013-03-22 DIAGNOSIS — J209 Acute bronchitis, unspecified: Secondary | ICD-10-CM | POA: Insufficient documentation

## 2013-03-22 DIAGNOSIS — Z23 Encounter for immunization: Secondary | ICD-10-CM

## 2013-03-22 DIAGNOSIS — R062 Wheezing: Secondary | ICD-10-CM | POA: Insufficient documentation

## 2013-03-22 LAB — BASIC METABOLIC PANEL
BUN: 26 mg/dL — ABNORMAL HIGH (ref 6–23)
CO2: 30 mEq/L (ref 19–32)
Calcium: 10.3 mg/dL (ref 8.4–10.5)
Chloride: 102 mEq/L (ref 96–112)
Creatinine, Ser: 1.4 mg/dL (ref 0.4–1.5)
GFR: 62.22 mL/min (ref >60.00)
Glucose, Bld: 111 mg/dL — ABNORMAL HIGH (ref 70–99)
Potassium: 4.7 mEq/L (ref 3.5–5.1)
Sodium: 139 mEq/L (ref 135–145)

## 2013-03-22 LAB — LIPID PANEL
Cholesterol: 110 mg/dL (ref 0–200)
HDL: 43.7 mg/dL (ref >39.00)
LDL Cholesterol: 48 mg/dL (ref 0–99)
Total CHOL/HDL Ratio: 3
Triglycerides: 94 mg/dL (ref 0.0–149.0)
VLDL: 18.8 mg/dL (ref 0.0–40.0)

## 2013-03-22 LAB — HEPATIC FUNCTION PANEL
ALT: 16 U/L (ref 0–53)
AST: 20 U/L (ref 0–37)
Albumin: 4.1 g/dL (ref 3.5–5.2)
Alkaline Phosphatase: 113 U/L (ref 39–117)
Bilirubin, Direct: 0.1 mg/dL (ref 0.0–0.3)
Total Bilirubin: 0.5 mg/dL (ref 0.3–1.2)
Total Protein: 8 g/dL (ref 6.0–8.3)

## 2013-03-22 LAB — HEMOGLOBIN A1C: Hgb A1c MFr Bld: 6.7 % — ABNORMAL HIGH (ref 4.6–6.5)

## 2013-03-22 MED ORDER — HYDROCODONE-HOMATROPINE 5-1.5 MG/5ML PO SYRP: 5.0000 mL | ORAL_SOLUTION | Freq: Four times a day (QID) | ORAL | Status: DC | PRN

## 2013-03-22 MED ORDER — VITAMIN D3 25 MCG (1000 UT) PO CAPS: 1.0000 | ORAL_CAPSULE | Freq: Every day | ORAL | Status: DC

## 2013-03-22 MED ORDER — ATORVASTATIN CALCIUM 40 MG PO TABS: 40.0000 mg | ORAL_TABLET | Freq: Every day | ORAL | Status: DC

## 2013-03-22 MED ORDER — AZITHROMYCIN 250 MG PO TABS: ORAL_TABLET | ORAL | Status: DC

## 2013-03-22 MED ORDER — OMEPRAZOLE 20 MG PO CPDR: 20.0000 mg | DELAYED_RELEASE_CAPSULE | Freq: Every day | ORAL | Status: DC

## 2013-03-22 MED ORDER — GLIMEPIRIDE 1 MG PO TABS: 0.5000 mg | ORAL_TABLET | Freq: Two times a day (BID) | ORAL | Status: DC

## 2013-03-22 MED ORDER — METFORMIN HCL 500 MG PO TABS: 1000.0000 mg | ORAL_TABLET | Freq: Two times a day (BID) | ORAL | Status: DC

## 2013-03-22 MED ORDER — PREDNISONE 10 MG PO TABS: ORAL_TABLET | ORAL | Status: DC

## 2013-03-22 MED ORDER — LOSARTAN POTASSIUM-HCTZ 100-25 MG PO TABS: 1.0000 | ORAL_TABLET | Freq: Every day | ORAL | Status: DC

## 2013-03-22 NOTE — Patient Instructions (Signed)
You had the new Prevnar pneumonia shot today Please take all new medication as prescribed - the antibiotic, cough medicine, and prednisone (low dose short course) Please continue all other medications as before, and refills have been done if requested. Please have the pharmacy call with any other refills you may need.  Please go to the LAB in the Basement (turn left off the elevator) for the tests to be done today You will be contacted by phone if any changes need to be made immediately.  Otherwise, you will receive a letter about your results with an explanation, but please check with MyChart first.  Please remember to sign up for My Chart if you have not done so, as this will be important to you in the future with finding out test results, communicating by private email, and scheduling acute appointments online when needed.  Please return in 6 months, or sooner if needed

## 2013-03-22 NOTE — Progress Notes (Signed)
Subjective:    Patient ID: Nicholas Patton, male    DOB: 10-05-1934, 79 y.o.   MRN: [id number]  HPI  Here with acute onset mild to mod 2-3 days ST, HA, general weakness and malaise, with prod cough greenish sputum, but Pt denies chest pain, increased sob or doe, wheezing, orthopnea, PND, increased LE swelling, palpitations, dizziness or syncope, except for onset mild wheeze last pm.   Pt denies polydipsia, polyuria, or low sugar symptoms such as weakness or confusion improved with po intake.  Pt states overall good compliance with meds, trying to follow lower cholesterol, diabetic diet, wt overall stable. Pt denies new neurological symptoms such as new headache, or facial or extremity weakness or numbness Past Medical History  Diagnosis Date  . ALLERGIC RHINITIS 01/24/2007  . BENIGN PROSTATIC HYPERTROPHY 01/24/2007  . BRONCHITIS, ACUTE 06/11/2008  . Cough 01/24/2007  . DIABETES MELLITUS, TYPE II 01/24/2007  . FATIGUE 07/27/2007  . HYPERLIPIDEMIA 01/24/2007  . HYPERTENSION 10/05/2006  . INSOMNIA 06/21/2008  . LEG PAIN, BILATERAL 10/06/2007  . PERIPHERAL NEUROPATHY, FEET 10/06/2007  . PERS HX TOBACCO USE PRESENTING HAZARDS HEALTH 09/17/2009  . VITAMIN B12 DEFICIENCY 09/06/2008  . VITAMIN D DEFICIENCY 01/16/2010  . Wheezing 01/16/2010  . Cholecystitis, acute 07/22/2012  . Ileus following gastrointestinal surgery 07/22/2012  . Postoperative fever 07/22/2012  . Erectile dysfunction 09/22/2012  . Peripheral neuropathy    Past Surgical History  Procedure Laterality Date  . Total knee arthroplasty  2001    Left  . Turp vaporization    . Cholecystectomy N/A 07/18/2012    Procedure: LAPAROSCOPIC CHOLECYSTECTOMY WITH INTRAOPERATIVE CHOLANGIOGRAM;  Surgeon: Zenovia Jarred, MD;  Location: [place];  Service: General;  Laterality: N/A;    reports that he has quit smoking. He does not have any smokeless tobacco history on file. He reports that he does not drink alcohol or use illicit drugs. family history  includes Lung cancer in his brother. No Known Allergies Current Outpatient Prescriptions on File Prior to Visit  Medication Sig Dispense Refill  . aspirin 81 MG EC tablet Take 81 mg by mouth daily.        . Blood Glucose Monitoring Suppl (ONE TOUCH ULTRA SYSTEM KIT) W/DEVICE KIT 1 kit by Does not apply route once.  1 each  0  . fluticasone (FLONASE) 50 MCG/ACT nasal spray Place 2 sprays into the nose daily.  16 g  2  . gabapentin (NEURONTIN) 100 MG capsule Take 1 capsule (100 mg total) by mouth at bedtime.  30 capsule  0  . glucose blood (ONE TOUCH TEST STRIPS) test strip Use as directed once daily, diagnosis code 250.00  100 each  11  . HYDROcodone-homatropine (HYCODAN) 5-1.5 MG/5ML syrup Take 5 mLs by mouth every 6 (six) hours as needed for cough.  180 mL  0  . meclizine (ANTIVERT) 25 MG tablet Take 1 tablet (25 mg total) by mouth 3 (three) times daily as needed.  30 tablet  0  . Multiple Vitamin (MULTIVITAMIN WITH MINERALS) TABS tablet Take 1 tablet by mouth daily.      . traMADol (ULTRAM) 50 MG tablet Take 1 tablet (50 mg total) by mouth every 6 (six) hours as needed for pain.  15 tablet  0   No current facility-administered medications on file prior to visit.   Review of Systems  Constitutional: Negative for unexpected weight change, or unusual diaphoresis  HENT: Negative for tinnitus.   Eyes: Negative for photophobia and visual disturbance.  Respiratory:  Negative for choking and stridor.   Gastrointestinal: Negative for vomiting and blood in stool.  Genitourinary: Negative for hematuria and decreased urine volume.  Musculoskeletal: Negative for acute joint swelling Skin: Negative for color change and wound.  Neurological: Negative for tremors and numbness other than noted  Psychiatric/Behavioral: Negative for decreased concentration or  hyperactivity.       Objective:   Physical Exam BP 134/60  Pulse 96  Temp(Src) 98 F (36.7 C) (Oral)  Ht _0  (1.905 m)  Wt 182 lb 2 oz  (82.611 kg)  BMI 22.76 kg/m2  SpO2 97% VS noted, mild ill Constitutional: Pt appears well-developed and well-nourished.  HENT: Head: NCAT.  Right Ear: External ear normal.  Left Ear: External ear normal.  Bilat tm's with mild erythema.  Max sinus areas non tender.  Pharynx with mild erythema, no exudate Eyes: Conjunctivae and EOM are normal. Pupils are equal, round, and reactive to light.  Neck: Normal range of motion. Neck supple.  Cardiovascular: Normal rate and regular rhythm.   Pulmonary/Chest: Effort normal and breath sounds mild decr with bilat few wheezes.  Neurological: Pt is alert. Not confused  Skin: Skin is warm. No erythema.  Psychiatric: Pt behavior is normal. Thought content normal.     Assessment & Plan:

## 2013-03-22 NOTE — Addendum Note (Signed)
Addended by: Sharon Seller B on: 03/22/2013 05:07 PM   Modules accepted: Orders

## 2013-03-22 NOTE — Assessment & Plan Note (Signed)
Mild to mod, for predpac asd  to f/u any worsening symptoms or concerns

## 2013-03-22 NOTE — Progress Notes (Signed)
Pre-visit discussion using our clinic review tool. No additional management support is needed unless otherwise documented below in the visit note.  

## 2013-03-22 NOTE — Assessment & Plan Note (Signed)
stable overall by history and exam, recent data reviewed with pt, and pt to continue medical treatment as before,  to f/u any worsening symptoms or concerns Lab Results  Component Value Date   HGBA1C 6.3* 07/18/2012    

## 2013-03-22 NOTE — Assessment & Plan Note (Signed)
Mild to mod, for antibx course,  to f/u any worsening symptoms or concerns 

## 2013-03-24 ENCOUNTER — Encounter (HOSPITAL_COMMUNITY): Payer: Self-pay | Admitting: Emergency Medicine

## 2013-03-24 ENCOUNTER — Emergency Department (INDEPENDENT_AMBULATORY_CARE_PROVIDER_SITE_OTHER)
Admission: EM | Admit: 2013-03-24 | Discharge: 2013-03-24 | Disposition: A | Discharge status: Home / Self Care | Payer: Medicare Other | Source: Home / Self Care | Attending: Family Medicine | Admitting: Family Medicine

## 2013-03-24 DIAGNOSIS — H1132 Conjunctival hemorrhage, left eye: Secondary | ICD-10-CM

## 2013-03-24 DIAGNOSIS — H1130 Conjunctival hemorrhage, unspecified eye: Secondary | ICD-10-CM | POA: Diagnosis not present

## 2013-03-24 NOTE — ED Provider Notes (Signed)
CSN: [id number]     Arrival date & time 03/24/13  1938 History   First MD Initiated Contact with Patient 03/24/13 2105     Chief Complaint  Patient presents with  . Eye Problem   (Consider location/radiation/quality/duration/timing/severity/associated sxs/prior Treatment) Patient is a 79 y.o. male presenting with eye problem. The history is provided by the patient.  Eye Problem Location:  L eye Quality:  Dull Severity:  Mild Onset quality:  Sudden Duration:  12 hours Progression:  Unchanged Chronicity:  New Relieved by:  None tried Worsened by:  Nothing tried Ineffective treatments:  None tried Associated symptoms: redness   Associated symptoms: no blurred vision, no decreased vision, no discharge, no double vision, no itching and no photophobia   Risk factors: no previous injury to eye     Past Medical History  Diagnosis Date  . ALLERGIC RHINITIS 01/24/2007  . BENIGN PROSTATIC HYPERTROPHY 01/24/2007  . BRONCHITIS, ACUTE 06/11/2008  . Cough 01/24/2007  . DIABETES MELLITUS, TYPE II 01/24/2007  . FATIGUE 07/27/2007  . HYPERLIPIDEMIA 01/24/2007  . HYPERTENSION 10/05/2006  . INSOMNIA 06/21/2008  . LEG PAIN, BILATERAL 10/06/2007  . PERIPHERAL NEUROPATHY, FEET 10/06/2007  . PERS HX TOBACCO USE PRESENTING HAZARDS HEALTH 09/17/2009  . VITAMIN B12 DEFICIENCY 09/06/2008  . VITAMIN D DEFICIENCY 01/16/2010  . Wheezing 01/16/2010  . Cholecystitis, acute 07/22/2012  . Ileus following gastrointestinal surgery 07/22/2012  . Postoperative fever 07/22/2012  . Erectile dysfunction 09/22/2012  . Peripheral neuropathy    Past Surgical History  Procedure Laterality Date  . Total knee arthroplasty  2001    Left  . Turp vaporization    . Cholecystectomy N/A 07/18/2012    Procedure: LAPAROSCOPIC CHOLECYSTECTOMY WITH INTRAOPERATIVE CHOLANGIOGRAM;  Surgeon: Zenovia Jarred, MD;  Location: [facility];  Service: General;  Laterality: N/A;   Family History  Problem Relation Age of Onset  . Lung cancer  Brother    History  Substance Use Topics  . Smoking status: Former Research scientist (life sciences)  . Smokeless tobacco: Not on file  . Alcohol Use: No    Review of Systems  Constitutional: Negative.   HENT: Negative.   Eyes: Positive for redness. Negative for blurred vision, double vision, photophobia, pain, discharge, itching and visual disturbance.  Respiratory: Negative.     Allergies  Review of patient's allergies indicates no known allergies.  Home Medications   Current Outpatient Rx  Name  Route  Sig  Dispense  Refill  . aspirin 81 MG EC tablet   Oral   Take 81 mg by mouth daily.           Marland Kitchen atorvastatin (LIPITOR) 40 MG tablet   Oral   Take 1 tablet (40 mg total) by mouth daily.   90 tablet   3   . azithromycin (ZITHROMAX Z-PAK) 250 MG tablet      Use as directed   6 each   1   . Blood Glucose Monitoring Suppl (ONE TOUCH ULTRA SYSTEM KIT) W/DEVICE KIT   Does not apply   1 kit by Does not apply route once.   1 each   0   . Cholecalciferol (VITAMIN D3) 1000 UNITS CAPS   Oral   Take 1 capsule (1,000 Units total) by mouth daily.   90 capsule   3   . fluticasone (FLONASE) 50 MCG/ACT nasal spray   Nasal   Place 2 sprays into the nose daily.   16 g   2   . gabapentin (NEURONTIN) 100 MG capsule  Oral   Take 1 capsule (100 mg total) by mouth at bedtime.   30 capsule   0   . glimepiride (AMARYL) 1 MG tablet   Oral   Take 0.5 tablets (0.5 mg total) by mouth 2 (two) times daily before a meal.   180 tablet   3   . glucose blood (ONE TOUCH TEST STRIPS) test strip      Use as directed once daily, diagnosis code 250.00   100 each   11   . HYDROcodone-homatropine (HYCODAN) 5-1.5 MG/5ML syrup   Oral   Take 5 mLs by mouth every 6 (six) hours as needed for cough.   180 mL   0   . losartan-hydrochlorothiazide (HYZAAR) 100-25 MG per tablet   Oral   Take 1 tablet by mouth daily.   90 tablet   3   . meclizine (ANTIVERT) 25 MG tablet   Oral   Take 1 tablet (25 mg  total) by mouth 3 (three) times daily as needed.   30 tablet   0   . metFORMIN (GLUCOPHAGE) 500 MG tablet   Oral   Take 2 tablets (1,000 mg total) by mouth 2 (two) times daily with a meal.   360 tablet   3   . Multiple Vitamin (MULTIVITAMIN WITH MINERALS) TABS tablet   Oral   Take 1 tablet by mouth daily.         Marland Kitchen omeprazole (PRILOSEC) 20 MG capsule   Oral   Take 1 capsule (20 mg total) by mouth daily.   90 capsule   3   . predniSONE (DELTASONE) 10 MG tablet      2 tabs by mouth per day for 5 days   10 tablet   0   . traMADol (ULTRAM) 50 MG tablet   Oral   Take 1 tablet (50 mg total) by mouth every 6 (six) hours as needed for pain.   15 tablet   0    BP 151/62  Pulse 65  Temp(Src) 98 F (36.7 C) (Oral)  Resp 16  SpO2 98% Physical Exam  Nursing note and vitals reviewed. Constitutional: He is oriented to person, place, and time. He appears well-developed and well-nourished. No distress.  Eyes: EOM and lids are normal. Pupils are equal, round, and reactive to light. Right conjunctiva has no hemorrhage. Left conjunctiva is not injected. Left conjunctiva has a hemorrhage.    Neck: Normal range of motion. Neck supple.  Lymphadenopathy:    He has no cervical adenopathy.  Neurological: He is alert and oriented to person, place, and time.  Skin: Skin is warm and dry.    ED Course  Procedures (including critical care time) Labs Review Labs Reviewed - No data to display Imaging Review No results found.  EKG Interpretation    Date/Time:    Ventricular Rate:    PR Interval:    QRS Duration:   QT Interval:    QTC Calculation:   R Axis:     Text Interpretation:              MDM   1. Subconjunctival hemorrhage of left eye       Billy Fischer, MD 03/24/13 2133

## 2013-03-24 NOTE — Discharge Instructions (Signed)
Warm compress to eye twice a day for 5 minutes. See your eye doctor if further problems.

## 2013-03-24 NOTE — ED Notes (Signed)
Pt  States  He  Woke  up  This  Am  With a  Red  Irritated  l    Eye        He  States  It  Feels  Somewhat  Gritty  In the  Affected  Eye      He  denys  Any  specefic injury

## 2013-04-12 DIAGNOSIS — H04129 Dry eye syndrome of unspecified lacrimal gland: Secondary | ICD-10-CM | POA: Diagnosis not present

## 2013-04-12 DIAGNOSIS — H40019 Open angle with borderline findings, low risk, unspecified eye: Secondary | ICD-10-CM | POA: Diagnosis not present

## 2013-04-12 DIAGNOSIS — H4320 Crystalline deposits in vitreous body, unspecified eye: Secondary | ICD-10-CM | POA: Diagnosis not present

## 2013-04-12 DIAGNOSIS — H2589 Other age-related cataract: Secondary | ICD-10-CM | POA: Diagnosis not present

## 2013-04-12 DIAGNOSIS — E119 Type 2 diabetes mellitus without complications: Secondary | ICD-10-CM | POA: Diagnosis not present

## 2013-04-12 LAB — HM DIABETES EYE EXAM

## 2013-04-14 ENCOUNTER — Encounter: Payer: Self-pay | Admitting: Internal Medicine

## 2013-05-06 ENCOUNTER — Encounter (HOSPITAL_COMMUNITY): Payer: Self-pay | Admitting: Emergency Medicine

## 2013-05-06 ENCOUNTER — Inpatient Hospital Stay (HOSPITAL_COMMUNITY)
Admission: EM | Admit: 2013-05-06 | Discharge: 2013-05-07 | DRG: 392 | Disposition: A | Discharge status: Home / Self Care | Payer: Medicare Other | Attending: Internal Medicine | Admitting: Internal Medicine

## 2013-05-06 ENCOUNTER — Emergency Department (HOSPITAL_COMMUNITY): Payer: Medicare Other

## 2013-05-06 DIAGNOSIS — M79609 Pain in unspecified limb: Secondary | ICD-10-CM

## 2013-05-06 DIAGNOSIS — G5790 Unspecified mononeuropathy of unspecified lower limb: Secondary | ICD-10-CM

## 2013-05-06 DIAGNOSIS — Z0000 Encounter for general adult medical examination without abnormal findings: Secondary | ICD-10-CM

## 2013-05-06 DIAGNOSIS — K529 Noninfective gastroenteritis and colitis, unspecified: Secondary | ICD-10-CM | POA: Diagnosis present

## 2013-05-06 DIAGNOSIS — K219 Gastro-esophageal reflux disease without esophagitis: Secondary | ICD-10-CM | POA: Diagnosis present

## 2013-05-06 DIAGNOSIS — M542 Cervicalgia: Secondary | ICD-10-CM

## 2013-05-06 DIAGNOSIS — Z7982 Long term (current) use of aspirin: Secondary | ICD-10-CM

## 2013-05-06 DIAGNOSIS — E1140 Type 2 diabetes mellitus with diabetic neuropathy, unspecified: Secondary | ICD-10-CM | POA: Diagnosis present

## 2013-05-06 DIAGNOSIS — J309 Allergic rhinitis, unspecified: Secondary | ICD-10-CM

## 2013-05-06 DIAGNOSIS — E785 Hyperlipidemia, unspecified: Secondary | ICD-10-CM | POA: Diagnosis not present

## 2013-05-06 DIAGNOSIS — E559 Vitamin D deficiency, unspecified: Secondary | ICD-10-CM

## 2013-05-06 DIAGNOSIS — E119 Type 2 diabetes mellitus without complications: Secondary | ICD-10-CM | POA: Diagnosis present

## 2013-05-06 DIAGNOSIS — N529 Male erectile dysfunction, unspecified: Secondary | ICD-10-CM

## 2013-05-06 DIAGNOSIS — Z9049 Acquired absence of other specified parts of digestive tract: Secondary | ICD-10-CM

## 2013-05-06 DIAGNOSIS — Z87891 Personal history of nicotine dependence: Secondary | ICD-10-CM

## 2013-05-06 DIAGNOSIS — G609 Hereditary and idiopathic neuropathy, unspecified: Secondary | ICD-10-CM | POA: Diagnosis present

## 2013-05-06 DIAGNOSIS — K5289 Other specified noninfective gastroenteritis and colitis: Principal | ICD-10-CM

## 2013-05-06 DIAGNOSIS — J209 Acute bronchitis, unspecified: Secondary | ICD-10-CM

## 2013-05-06 DIAGNOSIS — N400 Enlarged prostate without lower urinary tract symptoms: Secondary | ICD-10-CM

## 2013-05-06 DIAGNOSIS — S4380XA Sprain of other specified parts of unspecified shoulder girdle, initial encounter: Secondary | ICD-10-CM

## 2013-05-06 DIAGNOSIS — R142 Eructation: Secondary | ICD-10-CM | POA: Diagnosis not present

## 2013-05-06 DIAGNOSIS — R1115 Cyclical vomiting syndrome unrelated to migraine: Secondary | ICD-10-CM

## 2013-05-06 DIAGNOSIS — G4700 Insomnia, unspecified: Secondary | ICD-10-CM

## 2013-05-06 DIAGNOSIS — N320 Bladder-neck obstruction: Secondary | ICD-10-CM

## 2013-05-06 DIAGNOSIS — R062 Wheezing: Secondary | ICD-10-CM

## 2013-05-06 DIAGNOSIS — I10 Essential (primary) hypertension: Secondary | ICD-10-CM | POA: Diagnosis present

## 2013-05-06 DIAGNOSIS — R141 Gas pain: Secondary | ICD-10-CM | POA: Diagnosis not present

## 2013-05-06 DIAGNOSIS — Z801 Family history of malignant neoplasm of trachea, bronchus and lung: Secondary | ICD-10-CM

## 2013-05-06 DIAGNOSIS — R112 Nausea with vomiting, unspecified: Secondary | ICD-10-CM | POA: Diagnosis not present

## 2013-05-06 DIAGNOSIS — R42 Dizziness and giddiness: Secondary | ICD-10-CM

## 2013-05-06 DIAGNOSIS — E538 Deficiency of other specified B group vitamins: Secondary | ICD-10-CM

## 2013-05-06 DIAGNOSIS — H9192 Unspecified hearing loss, left ear: Secondary | ICD-10-CM

## 2013-05-06 DIAGNOSIS — M5412 Radiculopathy, cervical region: Secondary | ICD-10-CM

## 2013-05-06 DIAGNOSIS — K1379 Other lesions of oral mucosa: Secondary | ICD-10-CM

## 2013-05-06 DIAGNOSIS — J069 Acute upper respiratory infection, unspecified: Secondary | ICD-10-CM

## 2013-05-06 LAB — CBC WITH DIFFERENTIAL/PLATELET
Basophils Absolute: 0 10*3/uL (ref 0.0–0.1)
Basophils Relative: 0 % (ref 0–1)
Eosinophils Absolute: 0 10*3/uL (ref 0.0–0.7)
Eosinophils Relative: 0 % (ref 0–5)
HCT: 41.9 % (ref 39.0–52.0)
Hemoglobin: 13.6 g/dL (ref 13.0–17.0)
Lymphocytes Relative: 7 % — ABNORMAL LOW (ref 12–46)
Lymphs Abs: 0.7 10*3/uL (ref 0.7–4.0)
MCH: 27.8 pg (ref 26.0–34.0)
MCHC: 32.5 g/dL (ref 30.0–36.0)
MCV: 85.7 fL (ref 78.0–100.0)
Monocytes Absolute: 0.4 10*3/uL (ref 0.1–1.0)
Monocytes Relative: 4 % (ref 3–12)
Neutro Abs: 8.7 10*3/uL — ABNORMAL HIGH (ref 1.7–7.7)
Neutrophils Relative %: 89 % — ABNORMAL HIGH (ref 43–77)
Platelets: 228 10*3/uL (ref 150–400)
RBC: 4.89 MIL/uL (ref 4.22–5.81)
RDW: 14.3 % (ref 11.5–15.5)
WBC: 9.8 10*3/uL (ref 4.0–10.5)

## 2013-05-06 LAB — GLUCOSE, CAPILLARY
Glucose-Capillary: 119 mg/dL — ABNORMAL HIGH (ref 70–99)
Glucose-Capillary: 96 mg/dL (ref 70–99)
Glucose-Capillary: 92 mg/dL (ref 70–99)

## 2013-05-06 LAB — COMPREHENSIVE METABOLIC PANEL
ALT: 19 U/L (ref 0–53)
AST: 29 U/L (ref 0–37)
Albumin: 4.2 g/dL (ref 3.5–5.2)
Alkaline Phosphatase: 112 U/L (ref 39–117)
BUN: 22 mg/dL (ref 6–23)
CO2: 26 mEq/L (ref 19–32)
Calcium: 9.5 mg/dL (ref 8.4–10.5)
Chloride: 102 mEq/L (ref 96–112)
Creatinine, Ser: 1.15 mg/dL (ref 0.50–1.35)
GFR calc Af Amer: 69 mL/min — ABNORMAL LOW (ref >90)
GFR calc non Af Amer: 60 mL/min — ABNORMAL LOW (ref >90)
Glucose, Bld: 180 mg/dL — ABNORMAL HIGH (ref 70–99)
Potassium: 5 mEq/L (ref 3.7–5.3)
Sodium: 142 mEq/L (ref 137–147)
Total Bilirubin: <0.2 mg/dL — ABNORMAL LOW (ref 0.3–1.2)
Total Protein: 7.9 g/dL (ref 6.0–8.3)

## 2013-05-06 LAB — CLOSTRIDIUM DIFFICILE BY PCR: Toxigenic C. Difficile by PCR: NEGATIVE

## 2013-05-06 LAB — LIPASE, BLOOD: Lipase: 51 U/L (ref 11–59)

## 2013-05-06 MED ORDER — HYDRALAZINE HCL 20 MG/ML IJ SOLN: 10.0000 mg | Freq: Four times a day (QID) | INTRAMUSCULAR | Status: DC | PRN

## 2013-05-06 MED ORDER — PANTOPRAZOLE SODIUM 40 MG PO TBEC
40.0000 mg | DELAYED_RELEASE_TABLET | Freq: Every day | ORAL | Status: DC
  Administered 2013-05-06 – 2013-05-07 (×2): 40 mg via ORAL
  Filled 2013-05-06 (×2): qty 1

## 2013-05-06 MED ORDER — VITAMIN D3 25 MCG (1000 UT) PO CAPS: 1.0000 | ORAL_CAPSULE | Freq: Every day | ORAL | Status: DC

## 2013-05-06 MED ORDER — ALBUTEROL SULFATE (2.5 MG/3ML) 0.083% IN NEBU: 2.5000 mg | INHALATION_SOLUTION | RESPIRATORY_TRACT | Status: DC | PRN

## 2013-05-06 MED ORDER — SODIUM CHLORIDE 0.9 % IV BOLUS (SEPSIS)
1000.0000 mL | Freq: Once | INTRAVENOUS | Status: AC
  Administered 2013-05-06: 1000 mL via INTRAVENOUS

## 2013-05-06 MED ORDER — ASPIRIN 81 MG PO TBEC: 81.0000 mg | DELAYED_RELEASE_TABLET | Freq: Every day | ORAL | Status: DC

## 2013-05-06 MED ORDER — MORPHINE SULFATE 2 MG/ML IJ SOLN: 1.0000 mg | INTRAMUSCULAR | Status: DC | PRN

## 2013-05-06 MED ORDER — LOSARTAN POTASSIUM 50 MG PO TABS
100.0000 mg | ORAL_TABLET | Freq: Every day | ORAL | Status: DC
  Administered 2013-05-06 – 2013-05-07 (×2): 100 mg via ORAL
  Filled 2013-05-06 (×2): qty 2

## 2013-05-06 MED ORDER — ATORVASTATIN CALCIUM 40 MG PO TABS
40.0000 mg | ORAL_TABLET | Freq: Every day | ORAL | Status: DC
  Administered 2013-05-06: 40 mg via ORAL
  Filled 2013-05-06 (×2): qty 1

## 2013-05-06 MED ORDER — ALUM & MAG HYDROXIDE-SIMETH 200-200-20 MG/5ML PO SUSP
30.0000 mL | Freq: Four times a day (QID) | ORAL | Status: DC | PRN
  Administered 2013-05-06: 30 mL via ORAL
  Filled 2013-05-06: qty 30

## 2013-05-06 MED ORDER — GUAIFENESIN-DM 100-10 MG/5ML PO SYRP
5.0000 mL | ORAL_SOLUTION | ORAL | Status: DC | PRN
  Filled 2013-05-06: qty 5

## 2013-05-06 MED ORDER — ENOXAPARIN SODIUM 40 MG/0.4ML ~~LOC~~ SOLN
40.0000 mg | SUBCUTANEOUS | Status: DC
  Administered 2013-05-06: 40 mg via SUBCUTANEOUS
  Filled 2013-05-06 (×2): qty 0.4

## 2013-05-06 MED ORDER — ONDANSETRON HCL 4 MG/2ML IJ SOLN: 4.0000 mg | Freq: Four times a day (QID) | INTRAMUSCULAR | Status: DC | PRN

## 2013-05-06 MED ORDER — SODIUM CHLORIDE 0.9 % IV SOLN: INTRAVENOUS | Status: DC

## 2013-05-06 MED ORDER — ONDANSETRON HCL 4 MG/2ML IJ SOLN: 4.0000 mg | Freq: Three times a day (TID) | INTRAMUSCULAR | Status: DC | PRN

## 2013-05-06 MED ORDER — ASPIRIN EC 81 MG PO TBEC
81.0000 mg | DELAYED_RELEASE_TABLET | Freq: Every day | ORAL | Status: DC
  Administered 2013-05-06 – 2013-05-07 (×2): 81 mg via ORAL
  Filled 2013-05-06 (×2): qty 1

## 2013-05-06 MED ORDER — VITAMIN D3 25 MCG (1000 UNIT) PO TABS
1000.0000 [IU] | ORAL_TABLET | Freq: Every day | ORAL | Status: DC
  Administered 2013-05-06 – 2013-05-07 (×2): 1000 [IU] via ORAL
  Filled 2013-05-06 (×2): qty 1

## 2013-05-06 MED ORDER — ONDANSETRON HCL 4 MG/2ML IJ SOLN
4.0000 mg | Freq: Once | INTRAMUSCULAR | Status: AC
  Administered 2013-05-06: 4 mg via INTRAVENOUS
  Filled 2013-05-06: qty 2

## 2013-05-06 MED ORDER — INSULIN ASPART 100 UNIT/ML ~~LOC~~ SOLN: 0.0000 [IU] | Freq: Three times a day (TID) | SUBCUTANEOUS | Status: DC

## 2013-05-06 MED ORDER — ACETAMINOPHEN 325 MG PO TABS: 650.0000 mg | ORAL_TABLET | Freq: Four times a day (QID) | ORAL | Status: DC | PRN

## 2013-05-06 MED ORDER — ACETAMINOPHEN 650 MG RE SUPP: 650.0000 mg | Freq: Four times a day (QID) | RECTAL | Status: DC | PRN

## 2013-05-06 MED ORDER — POTASSIUM CHLORIDE IN NACL 20-0.9 MEQ/L-% IV SOLN
INTRAVENOUS | Status: DC
  Administered 2013-05-06: 100 mL/h via INTRAVENOUS
  Administered 2013-05-07: via INTRAVENOUS
  Filled 2013-05-06 (×5): qty 1000

## 2013-05-06 MED ORDER — ONDANSETRON 4 MG PO TBDP
8.0000 mg | ORAL_TABLET | Freq: Once | ORAL | Status: AC
  Administered 2013-05-06: 8 mg via ORAL
  Filled 2013-05-06: qty 2

## 2013-05-06 MED ORDER — ONDANSETRON HCL 4 MG PO TABS: 4.0000 mg | ORAL_TABLET | Freq: Four times a day (QID) | ORAL | Status: DC | PRN

## 2013-05-06 NOTE — ED Notes (Signed)
Pt vomiting in triage

## 2013-05-06 NOTE — ED Notes (Signed)
The pt has had vomiting  And diarrhea with abd cramps since 0300am today

## 2013-05-06 NOTE — ED Notes (Signed)
Patient transported to X-ray 

## 2013-05-06 NOTE — ED Notes (Signed)
Alert, NAD, calm, interactive, here with family x2, last ate 2000, cholecystectomy last year.

## 2013-05-06 NOTE — ED Provider Notes (Signed)
CSN: [id number]     Arrival date & time 05/06/13  0604 History   First MD Initiated Contact with Patient 05/06/13 [phone]     Chief Complaint  Patient presents with  . Emesis      HPI Patient presents emergency Department with vomiting and intermittent abdominal pain and diarrhea which started around 3:00 this morning.  Patient states when he went to bed last night he felt fine.  He did around 6 PM had some fish along with slaw and affect.  He also had some cantaloupe.  No other close relatives are ill.  Patient denies any fever or chills.  He is a diabetic and takes oral medications for that.  Patient does not have any hematemesis or melena. Past Medical History  Diagnosis Date  . ALLERGIC RHINITIS 01/24/2007  . BENIGN PROSTATIC HYPERTROPHY 01/24/2007  . BRONCHITIS, ACUTE 06/11/2008  . Cough 01/24/2007  . DIABETES MELLITUS, TYPE II 01/24/2007  . FATIGUE 07/27/2007  . HYPERLIPIDEMIA 01/24/2007  . HYPERTENSION 10/05/2006  . INSOMNIA 06/21/2008  . LEG PAIN, BILATERAL 10/06/2007  . PERIPHERAL NEUROPATHY, FEET 10/06/2007  . PERS HX TOBACCO USE PRESENTING HAZARDS HEALTH 09/17/2009  . VITAMIN B12 DEFICIENCY 09/06/2008  . VITAMIN D DEFICIENCY 01/16/2010  . Wheezing 01/16/2010  . Cholecystitis, acute 07/22/2012  . Ileus following gastrointestinal surgery 07/22/2012  . Postoperative fever 07/22/2012  . Erectile dysfunction 09/22/2012  . Peripheral neuropathy    Past Surgical History  Procedure Laterality Date  . Total knee arthroplasty  2001    Left  . Turp vaporization    . Cholecystectomy N/A 07/18/2012    Procedure: LAPAROSCOPIC CHOLECYSTECTOMY WITH INTRAOPERATIVE CHOLANGIOGRAM;  Surgeon: Zenovia Jarred, MD;  Location: [facility];  Service: General;  Laterality: N/A;   Family History  Problem Relation Age of Onset  . Lung cancer Brother   . Diabetes Mellitus II Mother    History  Substance Use Topics  . Smoking status: Former Smoker -- 1.00 packs/day for 35 years    Types: Cigarettes    Quit  date: 05/06/1993  . Smokeless tobacco: Not on file  . Alcohol Use: No    Review of Systems  All other systems reviewed and are negati   Allergies  Review of patient's allergies indicates no known allergies.  Home Medications   Current Outpatient Rx  Name  Route  Sig  Dispense  Refill  . aspirin 81 MG EC tablet   Oral   Take 81 mg by mouth daily.           Marland Kitchen atorvastatin (LIPITOR) 40 MG tablet   Oral   Take 1 tablet (40 mg total) by mouth daily.   90 tablet   3   . Blood Glucose Monitoring Suppl (ONE TOUCH ULTRA SYSTEM KIT) W/DEVICE KIT   Does not apply   1 kit by Does not apply route once.   1 each   0   . Cholecalciferol (VITAMIN D3) 1000 UNITS CAPS   Oral   Take 1 capsule (1,000 Units total) by mouth daily.   90 capsule   3   . glimepiride (AMARYL) 1 MG tablet   Oral   Take 0.5 tablets (0.5 mg total) by mouth 2 (two) times daily before a meal.   180 tablet   3   . glucose blood (ONE TOUCH TEST STRIPS) test strip      Use as directed once daily, diagnosis code 250.00   100 each   11   . losartan-hydrochlorothiazide (HYZAAR)  100-25 MG per tablet   Oral   Take 1 tablet by mouth daily.   90 tablet   3   . metFORMIN (GLUCOPHAGE) 500 MG tablet   Oral   Take 2 tablets (1,000 mg total) by mouth 2 (two) times daily with a meal.   360 tablet   3   . omeprazole (PRILOSEC) 20 MG capsule   Oral   Take 1 capsule (20 mg total) by mouth daily.   90 capsule   3    BP 131/67  Pulse 56  Temp(Src) 98.8 F (37.1 C) (Oral)  Resp 16  Ht _0  (1.854 m)  Wt 186 lb 9 oz (84.624 kg)  BMI 24.62 kg/m2  SpO2 96% Physical Exam  Nursing note and vitals reviewed. Constitutional: He is oriented to person, place, and time. He appears well-developed and well-nourished. No distress.  HENT:  Head: Normocephalic and atraumatic.  Eyes: Pupils are equal, round, and reactive to light.  Neck: Normal range of motion.  Cardiovascular: Normal rate and intact distal pulses.    Pulmonary/Chest: No respiratory distress.  Abdominal: Normal appearance. He exhibits no distension. There is no tenderness. There is no rebound and no guarding.  Musculoskeletal: Normal range of motion.  Neurological: He is alert and oriented to person, place, and time. No cranial nerve deficit.  Skin: Skin is warm and dry. No rash noted.  Psychiatric: He has a normal mood and affect. His behavior is normal.    ED Course  Procedures (including critical care time)  Medications  ondansetron (ZOFRAN-ODT) disintegrating tablet 8 mg (8 mg Oral Given 05/06/13 0611)  sodium chloride 0.9 % bolus 1,000 mL (0 mLs Intravenous Stopped 05/06/13 0956)  ondansetron (ZOFRAN) injection 4 mg (4 mg Intravenous Given 05/06/13 0822)    Labs Review Results for orders placed during the hospital encounter of 05/06/13  CLOSTRIDIUM DIFFICILE BY PCR      Result Value Ref Range   C difficile by pcr NEGATIVE  NEGATIVE  CBC WITH DIFFERENTIAL      Result Value Ref Range   WBC 9.8  4.0 - 10.5 K/uL   RBC 4.89  4.22 - 5.81 MIL/uL   Hemoglobin 13.6  13.0 - 17.0 g/dL   HCT 41.9  39.0 - 52.0 %   MCV 85.7  78.0 - 100.0 fL   MCH 27.8  26.0 - 34.0 pg   MCHC 32.5  30.0 - 36.0 g/dL   RDW 14.3  11.5 - 15.5 %   Platelets 228  150 - 400 K/uL   Neutrophils Relative % 89 (*) 43 - 77 %   Neutro Abs 8.7 (*) 1.7 - 7.7 K/uL   Lymphocytes Relative 7 (*) 12 - 46 %   Lymphs Abs 0.7  0.7 - 4.0 K/uL   Monocytes Relative 4  3 - 12 %   Monocytes Absolute 0.4  0.1 - 1.0 K/uL   Eosinophils Relative 0  0 - 5 %   Eosinophils Absolute 0.0  0.0 - 0.7 K/uL   Basophils Relative 0  0 - 1 %   Basophils Absolute 0.0  0.0 - 0.1 K/uL  COMPREHENSIVE METABOLIC PANEL      Result Value Ref Range   Sodium 142  137 - 147 mEq/L   Potassium 5.0  3.7 - 5.3 mEq/L   Chloride 102  96 - 112 mEq/L   CO2 26  19 - 32 mEq/L   Glucose, Bld 180 (*) 70 - 99 mg/dL   BUN 22  6 - 23 mg/dL   Creatinine, Ser 1.15  0.50 - 1.35 mg/dL   Calcium 9.5  8.4 - 10.5  mg/dL   Total Protein 7.9  6.0 - 8.3 g/dL   Albumin 4.2  3.5 - 5.2 g/dL   AST 29  0 - 37 U/L   ALT 19  0 - 53 U/L   Alkaline Phosphatase 112  39 - 117 U/L   Total Bilirubin <0.2 (*) 0.3 - 1.2 mg/dL   GFR calc non Af Amer 60 (*) >90 mL/min   GFR calc Af Amer 69 (*) >90 mL/min  LIPASE, BLOOD      Result Value Ref Range   Lipase 51  11 - 59 U/L  GI PATHOGEN PANEL BY PCR, STOOL      Result Value Ref Range   Campylobacter by PCR Negative     C difficile toxin A/B Negative     E coli 0157 by PCR Negative     E coli (ETEC) LT/ST Negative     E coli (STEC) Negative     Salmonella by PCR Positive     Shigella by PCR Negative     Norovirus G!/G2 Positive     Rotavirus A by PCR Positive     G lamblia by PCR Negative     Cryptosporidium by PCR Negative    GLUCOSE, CAPILLARY      Result Value Ref Range   Glucose-Capillary 119 (*) 70 - 99 mg/dL  BASIC METABOLIC PANEL      Result Value Ref Range   Sodium 144  137 - 147 mEq/L   Potassium 4.2  3.7 - 5.3 mEq/L   Chloride 108  96 - 112 mEq/L   CO2 23  19 - 32 mEq/L   Glucose, Bld 121 (*) 70 - 99 mg/dL   BUN 20  6 - 23 mg/dL   Creatinine, Ser 1.26  0.50 - 1.35 mg/dL   Calcium 8.9  8.4 - 10.5 mg/dL   GFR calc non Af Amer 54 (*) >90 mL/min   GFR calc Af Amer 62 (*) >90 mL/min  CBC      Result Value Ref Range   WBC 4.4  4.0 - 10.5 K/uL   RBC 4.01 (*) 4.22 - 5.81 MIL/uL   Hemoglobin 11.0 (*) 13.0 - 17.0 g/dL   HCT 34.4 (*) 39.0 - 52.0 %   MCV 85.8  78.0 - 100.0 fL   MCH 27.4  26.0 - 34.0 pg   MCHC 32.0  30.0 - 36.0 g/dL   RDW 14.9  11.5 - 15.5 %   Platelets 192  150 - 400 K/uL  GLUCOSE, CAPILLARY      Result Value Ref Range   Glucose-Capillary 96  70 - 99 mg/dL  GLUCOSE, CAPILLARY      Result Value Ref Range   Glucose-Capillary 92  70 - 99 mg/dL   Comment 1 Notify RN     Comment 2 Documented in Chart    GLUCOSE, CAPILLARY      Result Value Ref Range   Glucose-Capillary 119 (*) 70 - 99 mg/dL  GLUCOSE, CAPILLARY      Result Value  Ref Range   Glucose-Capillary 105 (*) 70 - 99 mg/dL   Dg Abd Acute W/chest  05/06/2013   CLINICAL DATA:  Vomiting, diarrhea, cramping  EXAM: ACUTE ABDOMEN SERIES (ABDOMEN 2 VIEW & CHEST 1 VIEW)  COMPARISON:  02/09/2013  FINDINGS: Cardiomediastinal silhouette is stable. No acute infiltrate or pleural effusion. Stable  chronic mild interstitial prominence especially in lower lobes. Dextroscoliosis and degenerative changes lumbar spine. Postcholecystectomy surgical clips are noted. Minimal distended bowel loops in mid abdomen with some air-fluid level suspicious for ileus or early bowel obstruction. No free abdominal air.  IMPRESSION: No acute disease within chest. Minimal distended small bowel loops mid abdomen with some air-fluid level suspicious for ileus or early bowel obstruction. No free abdominal air.   Electronically Signed   By: Lahoma Crocker M.D.   On: 05/06/2013 09:09        EKG Interpretation None      MDM   Final diagnoses:  Persistent vomiting        Dot Lanes, MD 05/12/13 2147

## 2013-05-06 NOTE — Progress Notes (Signed)
Called for report within 10 minute time frame. Nurse stated will call me back.

## 2013-05-06 NOTE — H&P (Signed)
PATIENT DETAILS Name: Nicholas Patton Age: 79 y.o. Sex: male Date of Birth: 02/06/1935 Admit Date: 05/06/2013 [id number] John, MD   CHIEF COMPLAINT:  Vomiting, diarrhea since early this morning  HPI: Nicholas Patton is a 79 y.o. male with a Past Medical History of diabetes, hypertension, dyslipidemia who presents today with the above noted complaint. Per the patient, he ate some fish at a local restaurant yesterday, and around 3:00 this morning started having numerous episodes of vomiting and diarrhea. He claims that as of today he has had approximately 10-15 episodes of vomiting and diarrhea. He was brought to the emergency room, where a abdominal x-ray showed questionable ileus or early small bowel obstruction. I was then asked to admit this patient for further evaluation and treatment. Patient denies any fever. He claims that he does have intermittent abdominal pain that is relieved by vomiting. During my evaluation, patient seemed very comfortable, he has not had any vomiting for the past 3 hours. His abdomen was very soft he did he was not in any distress   ALLERGIES:  No Known Allergies  PAST MEDICAL HISTORY: Past Medical History  Diagnosis Date  . ALLERGIC RHINITIS 01/24/2007  . BENIGN PROSTATIC HYPERTROPHY 01/24/2007  . BRONCHITIS, ACUTE 06/11/2008  . Cough 01/24/2007  . DIABETES MELLITUS, TYPE II 01/24/2007  . FATIGUE 07/27/2007  . HYPERLIPIDEMIA 01/24/2007  . HYPERTENSION 10/05/2006  . INSOMNIA 06/21/2008  . LEG PAIN, BILATERAL 10/06/2007  . PERIPHERAL NEUROPATHY, FEET 10/06/2007  . PERS HX TOBACCO USE PRESENTING HAZARDS HEALTH 09/17/2009  . VITAMIN B12 DEFICIENCY 09/06/2008  . VITAMIN D DEFICIENCY 01/16/2010  . Wheezing 01/16/2010  . Cholecystitis, acute 07/22/2012  . Ileus following gastrointestinal surgery 07/22/2012  . Postoperative fever 07/22/2012  . Erectile dysfunction 09/22/2012  . Peripheral neuropathy     PAST SURGICAL HISTORY: Past Surgical History    Procedure Laterality Date  . Total knee arthroplasty  2001    Left  . Turp vaporization    . Cholecystectomy N/A 07/18/2012    Procedure: LAPAROSCOPIC CHOLECYSTECTOMY WITH INTRAOPERATIVE CHOLANGIOGRAM;  Surgeon: Zenovia Jarred, MD;  Location: [facility];  Service: General;  Laterality: N/A;    MEDICATIONS AT HOME: Prior to Admission medications   Medication Sig Start Date End Date Taking? Authorizing Provider  aspirin 81 MG EC tablet Take 81 mg by mouth daily.     Yes Historical Provider, MD  atorvastatin (LIPITOR) 40 MG tablet Take 1 tablet (40 mg total) by mouth daily. 03/22/13  Yes [name], MD  Cholecalciferol (VITAMIN D3) 1000 UNITS CAPS Take 1 capsule (1,000 Units total) by mouth daily. 03/22/13  Yes [name], MD  glimepiride (AMARYL) 1 MG tablet Take 0.5 tablets (0.5 mg total) by mouth 2 (two) times daily before a meal. 03/22/13  Yes [name], MD  losartan-hydrochlorothiazide (HYZAAR) 100-25 MG per tablet Take 1 tablet by mouth daily. 03/22/13  Yes [name], MD  metFORMIN (GLUCOPHAGE) 500 MG tablet Take 2 tablets (1,000 mg total) by mouth 2 (two) times daily with a meal. 03/22/13  Yes [name], MD  omeprazole (PRILOSEC) 20 MG capsule Take 1 capsule (20 mg total) by mouth daily. 03/22/13  Yes [name], MD  Blood Glucose Monitoring Suppl (ONE TOUCH ULTRA SYSTEM KIT) W/DEVICE KIT 1 kit by Does not apply route once. 12/29/12   [name], MD  glucose blood (ONE TOUCH TEST STRIPS) test strip Use as directed once daily, diagnosis  code 250.00 01/03/13   James W John, MD    FAMILY HISTORY: Family History  Problem Relation Age of Onset  . Lung cancer Brother     SOCIAL HISTORY:  reports that he has quit smoking. He does not have any smokeless tobacco history on file. He reports that he does not drink alcohol or use illicit drugs.  REVIEW OF SYSTEMS:  Constitutional:   No  weight loss, night sweats,  Fevers, chills, fatigue.  HEENT:    No headaches, Difficulty  swallowing,Tooth/dental problems,Sore throat,  No sneezing, itching, ear ache, nasal congestion, post nasal drip,   Cardio-vascular: No chest pain,  Orthopnea, PND, swelling in lower extremities, anasarca,  dizziness, palpitations  GI:  No heartburn, indigestion  Resp: No shortness of breath with exertion or at rest.  No excess mucus, no productive cough, No non-productive cough,  No coughing up of blood.No change in color of mucus.No wheezing.No chest wall deformity  Skin:  no rash or lesions.  GU:  no dysuria, change in color of urine, no urgency or frequency.  No flank pain.  Musculoskeletal: No joint pain or swelling.  No decreased range of motion.  No back pain.  Psych: No change in mood or affect. No depression or anxiety.  No memory loss.   PHYSICAL EXAM: Blood pressure 126/57, pulse 87, temperature 97.4 F (36.3 C), temperature source Oral, resp. rate 16, height 6' 1" (1.854 m), weight 84.624 kg (186 lb 9 oz), SpO2 100.00%.  General appearance :Awake, alert, not in any distress. Speech Clear. Not toxic Looking HEENT: Atraumatic and Normocephalic, pupils equally reactive to light and accomodation Neck: supple, no JVD. No cervical lymphadenopathy.  Chest:Good air entry bilaterally, no added sounds  CVS: S1 S2 regular, no murmurs.  Abdomen: Bowel sounds present, Non tender and not distended with no gaurding, rigidity or rebound. Extremities: B/L Lower Ext shows no edema, both legs are warm to touch Neurology: Awake alert, and oriented X 3, CN II-XII intact, Non focal Skin:No Rash Wounds:N/A  LABS ON ADMISSION:   Recent Labs  05/06/13 0750  NA 142  K 5.0  CL 102  CO2 26  GLUCOSE 180*  BUN 22  CREATININE 1.15  CALCIUM 9.5    Recent Labs  05/06/13 0750  AST 29  ALT 19  ALKPHOS 112  BILITOT <0.2*  PROT 7.9  ALBUMIN 4.2    Recent Labs  05/06/13 0750  LIPASE 51    Recent Labs  05/06/13 0750  WBC 9.8  NEUTROABS 8.7*  HGB 13.6  HCT 41.9  MCV  85.7  PLT 228   No results found for this basename: CKTOTAL, CKMB, CKMBINDEX, TROPONINI,  in the last 72 hours No results found for this basename: DDIMER,  in the last 72 hours No components found with this basename: POCBNP,    RADIOLOGIC STUDIES ON ADMISSION: Dg Abd Acute W/chest  05/06/2013   CLINICAL DATA:  Vomiting, diarrhea, cramping  EXAM: ACUTE ABDOMEN SERIES (ABDOMEN 2 VIEW & CHEST 1 VIEW)  COMPARISON:  02/09/2013  FINDINGS: Cardiomediastinal silhouette is stable. No acute infiltrate or pleural effusion. Stable chronic mild interstitial prominence especially in lower lobes. Dextroscoliosis and degenerative changes lumbar spine. Postcholecystectomy surgical clips are noted. Minimal distended bowel loops in mid abdomen with some air-fluid level suspicious for ileus or early bowel obstruction. No free abdominal air.  IMPRESSION: No acute disease within chest. Minimal distended small bowel loops mid abdomen with some air-fluid level suspicious for ileus or early bowel obstruction. No free   abdominal air.   Electronically Signed   By: Liviu  Pop M.D.   On: 05/06/2013 09:09    ASSESSMENT AND PLAN: Present on Admission:  . Gastroenteritis - Suspect either viral gastroenteritis or food poisoning. Abdominal exam is completely unremarkable- doubt small bowel obstruction..  - Will admit as observation, keep n.p.o. for now .IV fluids, antiemetics and other supportive measures.  - Clinical course will be followed, if patient continues to have symptoms, and if exam is more consistent with a small bowel obstruction then we'll manage accordingly.   . DIABETES MELLITUS, TYPE II - Hold oral hypoglycemic agents, place on sliding scale insulin. Follow CBG trend   . GERD - Continue PPI   . HYPERLIPIDEMIA - Continue statin   . HYPERTENSION - Hold HCTZ with vomiting, continue losartan   Further plan will depend as patient's clinical course evolves and further radiologic and laboratory data become  available. Patient will be monitored closely.   Above noted plan was discussed with patient/family, they were in agreement.   DVT Prophylaxis: Prophylactic Lovenox   Code Status: Full Code   Total time spent for admission equals 45 minutes.  GHIMIRE,SHANKER [facility] Pager [phone]  If 7PM-7AM, please contact night-coverage [email or web address] Password [email or web address] 05/06/2013, 11:16 AM 

## 2013-05-06 NOTE — Progress Notes (Signed)
Nicholas Patton [id number] Admission Data: 05/06/2013 11:00 AM Attending Provider: Dot Lanes, MD  DEY:CXKGY [name], MD Consults/ Treatment Team:    Nicholas Patton is a 79 y.o. male patient admitted from ED awake, alert  & orientated  X 3,  Prior, VSS - Blood pressure 126/57, pulse 87, temperature 97.4 F (36.3 C), temperature source Oral, resp. rate 16, height _0  (1.854 m), weight 84.624 kg (186 lb 9 oz), SpO2 100.00%.,  Allergies:  No Known Allergies   Past Medical History  Diagnosis Date  . ALLERGIC RHINITIS 01/24/2007  . BENIGN PROSTATIC HYPERTROPHY 01/24/2007  . BRONCHITIS, ACUTE 06/11/2008  . Cough 01/24/2007  . DIABETES MELLITUS, TYPE II 01/24/2007  . FATIGUE 07/27/2007  . HYPERLIPIDEMIA 01/24/2007  . HYPERTENSION 10/05/2006  . INSOMNIA 06/21/2008  . LEG PAIN, BILATERAL 10/06/2007  . PERIPHERAL NEUROPATHY, FEET 10/06/2007  . PERS HX TOBACCO USE PRESENTING HAZARDS HEALTH 09/17/2009  . VITAMIN B12 DEFICIENCY 09/06/2008  . VITAMIN D DEFICIENCY 01/16/2010  . Wheezing 01/16/2010  . Cholecystitis, acute 07/22/2012  . Ileus following gastrointestinal surgery 07/22/2012  . Postoperative fever 07/22/2012  . Erectile dysfunction 09/22/2012  . Peripheral neuropathy     Pt orientation to unit, room and routine. Information packet given to patient/family and safety video watched.  Admission INP armband ID verified with patient/family, and in place. SR up x 2, fall risk assessment complete with Patient and family verbalizing understanding of risks associated with falls. Pt verbalizes an understanding of how to use the call bell and to call for help before getting out of bed.  Skin, clean-dry- intact without evidence of bruising, or skin tears.   No evidence of skin break down noted on exam.  Will cont to monitor and assist as needed.  Cline Draheim, Elissa Hefty, RN 05/06/2013 11:00 AM

## 2013-05-07 DIAGNOSIS — E119 Type 2 diabetes mellitus without complications: Secondary | ICD-10-CM | POA: Diagnosis not present

## 2013-05-07 DIAGNOSIS — I10 Essential (primary) hypertension: Secondary | ICD-10-CM | POA: Diagnosis not present

## 2013-05-07 DIAGNOSIS — K5289 Other specified noninfective gastroenteritis and colitis: Secondary | ICD-10-CM | POA: Diagnosis not present

## 2013-05-07 LAB — CBC
HCT: 34.4 % — ABNORMAL LOW (ref 39.0–52.0)
Hemoglobin: 11 g/dL — ABNORMAL LOW (ref 13.0–17.0)
MCH: 27.4 pg (ref 26.0–34.0)
MCHC: 32 g/dL (ref 30.0–36.0)
MCV: 85.8 fL (ref 78.0–100.0)
Platelets: 192 10*3/uL (ref 150–400)
RBC: 4.01 MIL/uL — ABNORMAL LOW (ref 4.22–5.81)
RDW: 14.9 % (ref 11.5–15.5)
WBC: 4.4 10*3/uL (ref 4.0–10.5)

## 2013-05-07 LAB — BASIC METABOLIC PANEL
BUN: 20 mg/dL (ref 6–23)
CO2: 23 mEq/L (ref 19–32)
Calcium: 8.9 mg/dL (ref 8.4–10.5)
Chloride: 108 mEq/L (ref 96–112)
Creatinine, Ser: 1.26 mg/dL (ref 0.50–1.35)
GFR calc Af Amer: 62 mL/min — ABNORMAL LOW (ref >90)
GFR calc non Af Amer: 54 mL/min — ABNORMAL LOW (ref >90)
Glucose, Bld: 121 mg/dL — ABNORMAL HIGH (ref 70–99)
Potassium: 4.2 mEq/L (ref 3.7–5.3)
Sodium: 144 mEq/L (ref 137–147)

## 2013-05-07 LAB — GLUCOSE, CAPILLARY
Glucose-Capillary: 119 mg/dL — ABNORMAL HIGH (ref 70–99)
Glucose-Capillary: 105 mg/dL — ABNORMAL HIGH (ref 70–99)

## 2013-05-07 NOTE — Progress Notes (Signed)
Utilization review completed.  P.J. Mikaylah Libbey,RN,BSN Case Manager [phone]  

## 2013-05-07 NOTE — Discharge Summary (Signed)
PATIENT DETAILS Name: Nicholas Patton Age: 79 y.o. Sex: male Date of Birth: 1934-11-21 MRN: [id number]. Admit Date: 05/06/2013 Admitting Physician: Jonetta Osgood, MD IPJ:[id number] [name], MD  Recommendations for Outpatient Follow-up:  1. Please follow GI pathogen panel-pending at the time of discharge.  PRIMARY DISCHARGE DIAGNOSIS:  Principal Problem:   Gastroenteritis Active Problems:   DIABETES MELLITUS, TYPE II   HYPERLIPIDEMIA   HYPERTENSION   GERD      PAST MEDICAL HISTORY: Past Medical History  Diagnosis Date  . ALLERGIC RHINITIS 01/24/2007  . BENIGN PROSTATIC HYPERTROPHY 01/24/2007  . BRONCHITIS, ACUTE 06/11/2008  . Cough 01/24/2007  . DIABETES MELLITUS, TYPE II 01/24/2007  . FATIGUE 07/27/2007  . HYPERLIPIDEMIA 01/24/2007  . HYPERTENSION 10/05/2006  . INSOMNIA 06/21/2008  . LEG PAIN, BILATERAL 10/06/2007  . PERIPHERAL NEUROPATHY, FEET 10/06/2007  . PERS HX TOBACCO USE PRESENTING HAZARDS HEALTH 09/17/2009  . VITAMIN B12 DEFICIENCY 09/06/2008  . VITAMIN D DEFICIENCY 01/16/2010  . Wheezing 01/16/2010  . Cholecystitis, acute 07/22/2012  . Ileus following gastrointestinal surgery 07/22/2012  . Postoperative fever 07/22/2012  . Erectile dysfunction 09/22/2012  . Peripheral neuropathy     DISCHARGE MEDICATIONS:   Medication List         aspirin 81 MG EC tablet  Take 81 mg by mouth daily.     atorvastatin 40 MG tablet  Commonly known as:  LIPITOR  Take 1 tablet (40 mg total) by mouth daily.     glimepiride 1 MG tablet  Commonly known as:  AMARYL  Take 0.5 tablets (0.5 mg total) by mouth 2 (two) times daily before a meal.     glucose blood test strip  Commonly known as:  ONE TOUCH TEST STRIPS  Use as directed once daily, diagnosis code 250.00     losartan-hydrochlorothiazide 100-25 MG per tablet  Commonly known as:  HYZAAR  Take 1 tablet by mouth daily.     metFORMIN 500 MG tablet  Commonly known as:  GLUCOPHAGE  Take 2 tablets (1,000 mg total) by mouth 2  (two) times daily with a meal.     omeprazole 20 MG capsule  Commonly known as:  PRILOSEC  Take 1 capsule (20 mg total) by mouth daily.     ONE TOUCH ULTRA SYSTEM KIT W/DEVICE Kit  1 kit by Does not apply route once.     Vitamin D3 1000 UNITS Caps  Take 1 capsule (1,000 Units total) by mouth daily.        ALLERGIES:  No Known Allergies  BRIEF HPI:  See H&P, Labs, Consult and Test reports for all details in brief, Nicholas Patton is a 79 y.o. male with a Past Medical History of diabetes, hypertension, dyslipidemia who presented with Vomiting, diarrhea that started the morning of admission.  CONSULTATIONS:   None  PERTINENT RADIOLOGIC STUDIES: Dg Abd Acute W/chest  05/06/2013   CLINICAL DATA:  Vomiting, diarrhea, cramping  EXAM: ACUTE ABDOMEN SERIES (ABDOMEN 2 VIEW & CHEST 1 VIEW)  COMPARISON:  02/09/2013  FINDINGS: Cardiomediastinal silhouette is stable. No acute infiltrate or pleural effusion. Stable chronic mild interstitial prominence especially in lower lobes. Dextroscoliosis and degenerative changes lumbar spine. Postcholecystectomy surgical clips are noted. Minimal distended bowel loops in mid abdomen with some air-fluid level suspicious for ileus or early bowel obstruction. No free abdominal air.  IMPRESSION: No acute disease within chest. Minimal distended small bowel loops mid abdomen with some air-fluid level suspicious for ileus or early bowel obstruction. No free abdominal air.  Electronically Signed   By: Lahoma Crocker M.D.   On: 05/06/2013 09:09     PERTINENT LAB RESULTS: CBC:  Recent Labs  05/06/13 0750 05/07/13 0620  WBC 9.8 4.4  HGB 13.6 11.0*  HCT 41.9 34.4*  PLT 228 192   CMET CMP     Component Value Date/Time   NA 144 05/07/2013 0620   K 4.2 05/07/2013 0620   CL 108 05/07/2013 0620   CO2 23 05/07/2013 0620   GLUCOSE 121* 05/07/2013 0620   BUN 20 05/07/2013 0620   CREATININE 1.26 05/07/2013 0620   CALCIUM 8.9 05/07/2013 0620   PROT 7.9 05/06/2013 0750   ALBUMIN  4.2 05/06/2013 0750   AST 29 05/06/2013 0750   ALT 19 05/06/2013 0750   ALKPHOS 112 05/06/2013 0750   BILITOT <0.2* 05/06/2013 0750   GFRNONAA 54* 05/07/2013 0620   GFRAA 62* 05/07/2013 0620    GFR Estimated Creatinine Clearance: 56.4 ml/min (by C-G formula based on Cr of 1.26).  Recent Labs  05/06/13 0750  LIPASE 51   No results found for this basename: CKTOTAL, CKMB, CKMBINDEX, TROPONINI,  in the last 72 hours No components found with this basename: POCBNP,  No results found for this basename: DDIMER,  in the last 72 hours No results found for this basename: HGBA1C,  in the last 72 hours No results found for this basename: CHOL, HDL, LDLCALC, TRIG, CHOLHDL, LDLDIRECT,  in the last 72 hours No results found for this basename: TSH, T4TOTAL, FREET3, T3FREE, THYROIDAB,  in the last 72 hours No results found for this basename: VITAMINB12, FOLATE, FERRITIN, TIBC, IRON, RETICCTPCT,  in the last 72 hours Coags: No results found for this basename: PT, INR,  in the last 72 hours Microbiology: Recent Results (from the past 240 hour(s))  CLOSTRIDIUM DIFFICILE BY PCR     Status: None   Collection Time    05/06/13  6:37 PM      Result Value Ref Range Status   C difficile by pcr NEGATIVE  NEGATIVE Final     BRIEF HOSPITAL COURSE:   Principal Problem:   Gastroenteritis - Suspect either viral gastroenteritis or food poisoning. Abdominal exam was completely unremarkable- doubt small bowel obstruction.An Abd xray showed ?early ileus/SBO, however clinical features were mostly consistent with gastroenteritis. -Patient was admitted, kept NPO overnight. His vomiting completely resolved.Diarrhea has resolved. Diet was advanced and patient has tolerated a regular diet for lunch and is stable for discharge.Patient did have a small bowel movement this morning, that was not loose and watery  Active Problems: DIABETES MELLITUS, TYPE II  - resume oral hypoglycemic agents on discharge  . GERD  - Continue  PPI   . HYPERLIPIDEMIA  - Continue statin   . HYPERTENSION  - resume HCTZ and losartan on discharge  TODAY-DAY OF DISCHARGE:  Subjective:   Nicholas Patton today has no headache,no chest abdominal pain,no new weakness tingling or numbness, feels much better wants to go home today.   Objective:   Blood pressure 125/56, pulse 77, temperature 98.9 F (37.2 C), temperature source Oral, resp. rate 20, height _0  (1.854 m), weight 84.624 kg (186 lb 9 oz), SpO2 98.00%.  Intake/Output Summary (Last 24 hours) at 05/07/13 1406 Last data filed at 05/07/13 0700  Gross per 24 hour  Intake   2090 ml  Output    200 ml  Net   1890 ml   Filed Weights   05/06/13 0609  Weight: 84.624 kg (186 lb 9 oz)  Exam Awake Alert, Oriented *3, No new F.N deficits, Normal affect [place].AT,PERRAL Supple Neck,No JVD, No cervical lymphadenopathy appriciated.  Symmetrical Chest wall movement, Good air movement bilaterally, CTAB RRR,No Gallops,Rubs or new Murmurs, No Parasternal Heave +ve B.Sounds, Abd Soft, Non tender, No organomegaly appriciated, No rebound -guarding or rigidity. No Cyanosis, Clubbing or edema, No new Rash or bruise  DISCHARGE CONDITION: Stable  DISPOSITION: Home  DISCHARGE INSTRUCTIONS:    Activity:  As tolerated   Diet recommendation: Diabetic Diet Heart Healthy diet      Discharge Orders   Future Appointments Provider Department Dept Phone   09/19/2013 9:15 AM [name], MD [facility] [phone]   Future Orders Complete By Expires   Call MD for:  persistant nausea and vomiting  As directed    Diet - low sodium heart healthy  As directed    Increase activity slowly  As directed       Follow-up Information   Follow up with Cathlean Cower, MD. Schedule an appointment as soon as possible for a visit in 1 week.   Specialties:  Internal Medicine, Radiology   Contact information:   [address] [place] [address] [phone]        Total Time spent on discharge equals 45 minutes.  SignedOren Binet 05/07/2013 2:06 PM

## 2013-05-07 NOTE — Progress Notes (Signed)
NURSING PROGRESS NOTE  Nicholas Patton [id number] Discharge Data: 05/07/2013 3:41 PM Attending Provider: Jonetta Osgood, MD TIW:[id number] [name], MD     Wonda Cheng to be D/C'd Home per MD order.    All IV's discontinued with no bleeding noted.  All belongings returned to patient for patient to take home.   Last Vital Signs:  Blood pressure 131/67, pulse 56, temperature 98.8 F (37.1 C), temperature source Oral, resp. rate 16, height _0  (1.854 m), weight 84.624 kg (186 lb 9 oz), SpO2 96.00%.  Discharge Medication List   Medication List         aspirin 81 MG EC tablet  Take 81 mg by mouth daily.     atorvastatin 40 MG tablet  Commonly known as:  LIPITOR  Take 1 tablet (40 mg total) by mouth daily.     glimepiride 1 MG tablet  Commonly known as:  AMARYL  Take 0.5 tablets (0.5 mg total) by mouth 2 (two) times daily before a meal.     glucose blood test strip  Commonly known as:  ONE TOUCH TEST STRIPS  Use as directed once daily, diagnosis code 250.00     losartan-hydrochlorothiazide 100-25 MG per tablet  Commonly known as:  HYZAAR  Take 1 tablet by mouth daily.     metFORMIN 500 MG tablet  Commonly known as:  GLUCOPHAGE  Take 2 tablets (1,000 mg total) by mouth 2 (two) times daily with a meal.     omeprazole 20 MG capsule  Commonly known as:  PRILOSEC  Take 1 capsule (20 mg total) by mouth daily.     ONE TOUCH ULTRA SYSTEM KIT W/DEVICE Kit  1 kit by Does not apply route once.     Vitamin D3 1000 UNITS Caps  Take 1 capsule (1,000 Units total) by mouth daily.        Joslyn Hy, MSN, RN, [employer]

## 2013-05-08 ENCOUNTER — Telehealth: Payer: Self-pay | Admitting: Internal Medicine

## 2013-05-08 LAB — GI PATHOGEN PANEL BY PCR, STOOL
C difficile toxin A/B: NEGATIVE
Campylobacter by PCR: NEGATIVE
Cryptosporidium by PCR: NEGATIVE
E coli (ETEC) LT/ST: NEGATIVE
E coli (STEC): NEGATIVE
E coli 0157 by PCR: NEGATIVE
G lamblia by PCR: NEGATIVE
Norovirus GI/GII: POSITIVE
Rotavirus A by PCR: POSITIVE
Salmonella by PCR: POSITIVE
Shigella by PCR: NEGATIVE

## 2013-05-08 NOTE — Telephone Encounter (Signed)
Patient Information:  Caller Name: [facility]  Phone: [phone]  Patient: , Nicholas Patton  Gender: Male  DOB: 1934-12-02  Age: 79 Years  PCP: Scarlette Calico (Adults only)  Office Follow Up:  Does the office need to follow up with this patient?: No  Instructions For The Office: N/A   Symptoms  Reason For Call & Symptoms: Pt spent the night in the hospital overnight on Sat 05/06/13 for vomiting. Pt had IVF and after he could eat and drink he was d/c home. Pt has not vomited since Sat night 05/06/13. Pt has had some diarrhea - he had 2 on Sunday 05/07/13 and 2 x today. Pt last urinated this am.  Reviewed Health History In EMR: Yes  Reviewed Medications In EMR: Yes  Reviewed Allergies In EMR: Yes  Reviewed Surgeries / Procedures: Yes  Date of Onset of Symptoms: 05/06/2013  Treatments Tried: IVF in the hospital  Treatments Tried Worked: No  Guideline(s) Used:  Diarrhea  Disposition Per Guideline:   Callback by PCP Today  Reason For Disposition Reached:   Age > 85 years  Advice Given:  N/A  Patient Will Follow Care Advice:  YES

## 2013-05-15 ENCOUNTER — Ambulatory Visit (INDEPENDENT_AMBULATORY_CARE_PROVIDER_SITE_OTHER): Payer: Medicare Other | Admitting: Podiatry

## 2013-05-15 ENCOUNTER — Encounter: Payer: Self-pay | Admitting: Podiatry

## 2013-05-15 VITALS — BP 147/58 | HR 64 | Ht 75.0 in | Wt 184.0 lb

## 2013-05-15 DIAGNOSIS — E1149 Type 2 diabetes mellitus with other diabetic neurological complication: Secondary | ICD-10-CM

## 2013-05-15 DIAGNOSIS — M79609 Pain in unspecified limb: Secondary | ICD-10-CM | POA: Diagnosis not present

## 2013-05-15 DIAGNOSIS — M79606 Pain in leg, unspecified: Secondary | ICD-10-CM

## 2013-05-15 DIAGNOSIS — M2010 Hallux valgus (acquired), unspecified foot: Secondary | ICD-10-CM | POA: Diagnosis not present

## 2013-05-15 DIAGNOSIS — M2040 Other hammer toe(s) (acquired), unspecified foot: Secondary | ICD-10-CM

## 2013-05-15 DIAGNOSIS — E1142 Type 2 diabetes mellitus with diabetic polyneuropathy: Secondary | ICD-10-CM

## 2013-05-15 NOTE — Progress Notes (Signed)
Diabetic for 6-7 years or longer.  Left foot arch area and toes get numb at times. Pain in between the first and 2nd digit from build up corn. Wears Gel toe spreader and still hurts.  Objective: Pedal pulses are all palpable.  Plantar callus under the MPJ right. No open lesions. No edema or erythema.  Inter digital keratoma 1st and 2nd digit at contact surface left foot. Severe hallux valgus with bunion bilateral. Severely contracted 2nd digit left.  Assessment: Diabetic neuropathy. Hallux valgus with bunion bilateral. Severe contracted hammer toe deformity 2nd left. Painful interdigital corn 1st and 2nd digit left.  Plan: Debrided all lesions. Reviewed findings and all available treatment options including diabetic shoes and surgical intervention.  Patient will return in 3 months for palliation.

## 2013-05-15 NOTE — Patient Instructions (Signed)
Seen for painful toe with painful corn in 2nd inter digital space.  All corns and calluses debrided.  If pain continues, the digits may be straightened to prevent ulceration.  Return in 3 months for Routine Foot care.

## 2013-05-23 ENCOUNTER — Ambulatory Visit (INDEPENDENT_AMBULATORY_CARE_PROVIDER_SITE_OTHER): Payer: Medicare Other | Admitting: Internal Medicine

## 2013-05-23 ENCOUNTER — Encounter: Payer: Self-pay | Admitting: Internal Medicine

## 2013-05-23 VITALS — BP 132/70 | HR 75 | Temp 97.3°F | Ht 75.0 in | Wt 187.2 lb

## 2013-05-23 DIAGNOSIS — E538 Deficiency of other specified B group vitamins: Secondary | ICD-10-CM | POA: Diagnosis not present

## 2013-05-23 DIAGNOSIS — I10 Essential (primary) hypertension: Secondary | ICD-10-CM | POA: Diagnosis not present

## 2013-05-23 DIAGNOSIS — G5790 Unspecified mononeuropathy of unspecified lower limb: Secondary | ICD-10-CM

## 2013-05-23 DIAGNOSIS — E119 Type 2 diabetes mellitus without complications: Secondary | ICD-10-CM | POA: Diagnosis not present

## 2013-05-23 MED ORDER — AMITRIPTYLINE HCL 50 MG PO TABS: 50.0000 mg | ORAL_TABLET | Freq: Every evening | ORAL | Status: DC | PRN

## 2013-05-23 NOTE — Assessment & Plan Note (Signed)
To re-start oral b12

## 2013-05-23 NOTE — Assessment & Plan Note (Signed)
More symptomatic  - for elavil 50 qhs, consider 100 gm if not effective

## 2013-05-23 NOTE — Assessment & Plan Note (Signed)
stable overall by history and exam, recent data reviewed with pt, and pt to continue medical treatment as before,  to f/u any worsening symptoms or concerns BP Readings from Last 3 Encounters:  05/23/13 132/70  05/15/13 147/58  05/07/13 131/67

## 2013-05-23 NOTE — Progress Notes (Signed)
 Subjective:    Patient ID: Nicholas Patton, male    DOB: 04/02/1934, 79 y.o.   MRN: [id number]  HPI  Here with 2 wks onset worsening distal bilat feet/ankle numbness/tingling/stinging sensation/prickling most of the day, but worse to try to get to sleep.  Has hx of b12 deficiency and periph neuropathy. Pt denies new neurological symptoms such as new headache, or facial or extremity weakness. Pt denies chest pain, increased sob or doe, wheezing, orthopnea, PND, increased LE swelling, palpitations, dizziness or syncope. Past Medical History  Diagnosis Date  . ALLERGIC RHINITIS 01/24/2007  . BENIGN PROSTATIC HYPERTROPHY 01/24/2007  . BRONCHITIS, ACUTE 06/11/2008  . Cough 01/24/2007  . DIABETES MELLITUS, TYPE II 01/24/2007  . FATIGUE 07/27/2007  . HYPERLIPIDEMIA 01/24/2007  . HYPERTENSION 10/05/2006  . INSOMNIA 06/21/2008  . LEG PAIN, BILATERAL 10/06/2007  . PERIPHERAL NEUROPATHY, FEET 10/06/2007  . PERS HX TOBACCO USE PRESENTING HAZARDS HEALTH 09/17/2009  . VITAMIN B12 DEFICIENCY 09/06/2008  . VITAMIN D DEFICIENCY 01/16/2010  . Wheezing 01/16/2010  . Cholecystitis, acute 07/22/2012  . Ileus following gastrointestinal surgery 07/22/2012  . Postoperative fever 07/22/2012  . Erectile dysfunction 09/22/2012  . Peripheral neuropathy    Past Surgical History  Procedure Laterality Date  . Total knee arthroplasty  2001    Left  . Turp vaporization    . Cholecystectomy N/A 07/18/2012    Procedure: LAPAROSCOPIC CHOLECYSTECTOMY WITH INTRAOPERATIVE CHOLANGIOGRAM;  Surgeon: Burke E Thompson, MD;  Location: [facility] OR;  Service: General;  Laterality: N/A;    reports that he quit smoking about 20 years ago. His smoking use included Cigarettes. He has a 35 pack-year smoking history. He has never used smokeless tobacco. He reports that he does not drink alcohol or use illicit drugs. family history includes Diabetes Mellitus II in his mother; Lung cancer in his brother. No Known Allergies Current Outpatient  Prescriptions on File Prior to Visit  Medication Sig Dispense Refill  . aspirin 81 MG EC tablet Take 81 mg by mouth daily.        . atorvastatin (LIPITOR) 40 MG tablet Take 1 tablet (40 mg total) by mouth daily.  90 tablet  3  . Blood Glucose Monitoring Suppl (ONE TOUCH ULTRA SYSTEM KIT) W/DEVICE KIT 1 kit by Does not apply route once.  1 each  0  . Cholecalciferol (VITAMIN D3) 1000 UNITS CAPS Take 1 capsule (1,000 Units total) by mouth daily.  90 capsule  3  . glimepiride (AMARYL) 1 MG tablet Take 0.5 tablets (0.5 mg total) by mouth 2 (two) times daily before a meal.  180 tablet  3  . glucose blood (ONE TOUCH TEST STRIPS) test strip Use as directed once daily, diagnosis code 250.00  100 each  11  . losartan-hydrochlorothiazide (HYZAAR) 100-25 MG per tablet Take 1 tablet by mouth daily.  90 tablet  3  . metFORMIN (GLUCOPHAGE) 500 MG tablet Take 2 tablets (1,000 mg total) by mouth 2 (two) times daily with a meal.  360 tablet  3  . omeprazole (PRILOSEC) 20 MG capsule Take 1 capsule (20 mg total) by mouth daily.  90 capsule  3   No current facility-administered medications on file prior to visit.    Review of Systems  Constitutional: Negative for unexpected weight change, or unusual diaphoresis  HENT: Negative for tinnitus.   Eyes: Negative for photophobia and visual disturbance.  Respiratory: Negative for choking and stridor.   Gastrointestinal: Negative for vomiting and blood in stool.  Genitourinary: Negative for hematuria   and decreased urine volume.  Musculoskeletal: Negative for acute joint swelling Skin: Negative for color change and wound.  Neurological: Negative for tremors and numbness other than noted  Psychiatric/Behavioral: Negative for decreased concentration or  hyperactivity.       Objective:   Physical Exam BP 132/70  Pulse 75  Temp(Src) 97.3 F (36.3 C) (Oral)  Ht 6' 3" (1.905 m)  Wt 187 lb 4 oz (84.936 kg)  BMI 23.40 kg/m2  SpO2 95% VS noted,  Constitutional: Pt  appears well-developed and well-nourished.  HENT: Head: NCAT.  Right Ear: External ear normal.  Left Ear: External ear normal.  Eyes: Conjunctivae and EOM are normal. Pupils are equal, round, and reactive to light.  Neck: Normal range of motion. Neck supple.  Cardiovascular: Normal rate and regular rhythm.   Pulmonary/Chest: Effort normal and breath sounds normal.  Abd:  Soft, NT, non-distended, + BS Neurological: Pt is alert. Not confused , motor 5/5, dorsalis pedis 1+ bilat LE's, decr sens to LT dorsal feet Skin: Skin is warm. No erythema.  Psychiatric: Pt behavior is normal. Thought content normal.     Assessment & Plan:

## 2013-05-23 NOTE — Progress Notes (Signed)
Pre visit review using our clinic review tool, if applicable. No additional management support is needed unless otherwise documented below in the visit note.

## 2013-05-23 NOTE — Assessment & Plan Note (Signed)
stable overall by history and exam, recent data reviewed with pt, and pt to continue medical treatment as before,  to f/u any worsening symptoms or concerns Lab Results  Component Value Date   HGBA1C 6.7* 03/22/2013    

## 2013-05-23 NOTE — Patient Instructions (Signed)
Please take all new medication as prescribed - the generic elavil at 50 mg at bedtime to help with the sensations in the feet  Please continue all other medications as before, and refills have been done if requested. Please have the pharmacy call with any other refills you may need.  Please continue your efforts at being more active, low cholesterol diet, and weight control.

## 2013-06-01 ENCOUNTER — Telehealth: Payer: Self-pay

## 2013-06-01 NOTE — Telephone Encounter (Signed)
Relevant patient education assigned to patient using Emmi.

## 2013-06-14 DIAGNOSIS — N401 Enlarged prostate with lower urinary tract symptoms: Secondary | ICD-10-CM | POA: Diagnosis not present

## 2013-06-14 DIAGNOSIS — N139 Obstructive and reflux uropathy, unspecified: Secondary | ICD-10-CM | POA: Diagnosis not present

## 2013-06-14 DIAGNOSIS — N138 Other obstructive and reflux uropathy: Secondary | ICD-10-CM | POA: Diagnosis not present

## 2013-06-14 DIAGNOSIS — R300 Dysuria: Secondary | ICD-10-CM | POA: Diagnosis not present

## 2013-07-07 ENCOUNTER — Telehealth: Payer: Self-pay | Admitting: Internal Medicine

## 2013-07-07 ENCOUNTER — Encounter: Payer: Self-pay | Admitting: Family Medicine

## 2013-07-07 ENCOUNTER — Ambulatory Visit (INDEPENDENT_AMBULATORY_CARE_PROVIDER_SITE_OTHER): Payer: Medicare Other | Admitting: Family Medicine

## 2013-07-07 VITALS — BP 130/62 | HR 92 | Temp 98.0°F | Ht 75.0 in | Wt 187.0 lb

## 2013-07-07 DIAGNOSIS — R05 Cough: Secondary | ICD-10-CM | POA: Diagnosis not present

## 2013-07-07 DIAGNOSIS — R0982 Postnasal drip: Secondary | ICD-10-CM

## 2013-07-07 DIAGNOSIS — R059 Cough, unspecified: Secondary | ICD-10-CM

## 2013-07-07 DIAGNOSIS — J309 Allergic rhinitis, unspecified: Secondary | ICD-10-CM

## 2013-07-07 MED ORDER — FLUTICASONE PROPIONATE 50 MCG/ACT NA SUSP: 2.0000 | Freq: Every day | NASAL | Status: DC

## 2013-07-07 NOTE — Progress Notes (Signed)
Pre visit review using our clinic review tool, if applicable. No additional management support is needed unless otherwise documented below in the visit note.

## 2013-07-07 NOTE — Patient Instructions (Signed)
-  start the flonase 2 sprays each nostril  -AFRIN per instructions for 4 days only - then STOP  -follow up with your doctor in 3-4 weeks or sooner if worsening or other concerns

## 2013-07-07 NOTE — Progress Notes (Signed)
No chief complaint on file.   HPI:  -started: chronic - worse in spring and fall -symptoms:nasal congestion, tickle in throat particularly at night, PND, cough mild -denies:fever, SOB, NVD, tooth pain, wheezing, sinus pain, ear pain -has tried: hx of treatment with antibiotic and had antibiotic (zpak) on hand and tried this a few weeks ago but did not help -sick contacts/travel/risks: denies flu exposure, tick exposure or or Ebola risks -denies smoking or hx of smoking  ROS: See pertinent positives and negatives per HPI.  Past Medical History  Diagnosis Date  . ALLERGIC RHINITIS 01/24/2007  . BENIGN PROSTATIC HYPERTROPHY 01/24/2007  . BRONCHITIS, ACUTE 06/11/2008  . Cough 01/24/2007  . DIABETES MELLITUS, TYPE II 01/24/2007  . FATIGUE 07/27/2007  . HYPERLIPIDEMIA 01/24/2007  . HYPERTENSION 10/05/2006  . INSOMNIA 06/21/2008  . LEG PAIN, BILATERAL 10/06/2007  . PERIPHERAL NEUROPATHY, FEET 10/06/2007  . PERS HX TOBACCO USE PRESENTING HAZARDS HEALTH 09/17/2009  . VITAMIN B12 DEFICIENCY 09/06/2008  . VITAMIN D DEFICIENCY 01/16/2010  . Wheezing 01/16/2010  . Cholecystitis, acute 07/22/2012  . Ileus following gastrointestinal surgery 07/22/2012  . Postoperative fever 07/22/2012  . Erectile dysfunction 09/22/2012  . Peripheral neuropathy     Past Surgical History  Procedure Laterality Date  . Total knee arthroplasty  2001    Left  . Turp vaporization    . Cholecystectomy N/A 07/18/2012    Procedure: LAPAROSCOPIC CHOLECYSTECTOMY WITH INTRAOPERATIVE CHOLANGIOGRAM;  Surgeon: Zenovia Jarred, MD;  Location: [place];  Service: General;  Laterality: N/A;    Family History  Problem Relation Age of Onset  . Lung cancer Brother   . Diabetes Mellitus II Mother     History   Social History  . Marital Status: Married    Spouse Name: N/A    Number of Children: N/A  . Years of Education: N/A   Social History Main Topics  . Smoking status: Former Smoker -- 1.00 packs/day for 35 years    Types:  Cigarettes    Quit date: 05/06/1993  . Smokeless tobacco: Never Used  . Alcohol Use: No  . Drug Use: No  . Sexual Activity: No   Other Topics Concern  . None   Social History Narrative  . None    Current outpatient prescriptions:amitriptyline (ELAVIL) 50 MG tablet, Take 1 tablet (50 mg total) by mouth at bedtime as needed for sleep., Disp: 90 tablet, Rfl: 1;  aspirin 81 MG EC tablet, Take 81 mg by mouth daily.  , Disp: , Rfl: ;  atorvastatin (LIPITOR) 40 MG tablet, Take 1 tablet (40 mg total) by mouth daily., Disp: 90 tablet, Rfl: 3 Blood Glucose Monitoring Suppl (ONE TOUCH ULTRA SYSTEM KIT) W/DEVICE KIT, 1 kit by Does not apply route once., Disp: 1 each, Rfl: 0;  Cholecalciferol (VITAMIN D3) 1000 UNITS CAPS, Take 1 capsule (1,000 Units total) by mouth daily., Disp: 90 capsule, Rfl: 3;  glimepiride (AMARYL) 1 MG tablet, Take 0.5 tablets (0.5 mg total) by mouth 2 (two) times daily before a meal., Disp: 180 tablet, Rfl: 3 glucose blood (ONE TOUCH TEST STRIPS) test strip, Use as directed once daily, diagnosis code 250.00, Disp: 100 each, Rfl: 11;  losartan-hydrochlorothiazide (HYZAAR) 100-25 MG per tablet, Take 1 tablet by mouth daily., Disp: 90 tablet, Rfl: 3;  metFORMIN (GLUCOPHAGE) 500 MG tablet, Take 2 tablets (1,000 mg total) by mouth 2 (two) times daily with a meal., Disp: 360 tablet, Rfl: 3 omeprazole (PRILOSEC) 20 MG capsule, Take 1 capsule (20 mg total) by mouth daily.,  Disp: 90 capsule, Rfl: 3;  fluticasone (FLONASE) 50 MCG/ACT nasal spray, Place 2 sprays into both nostrils daily., Disp: 16 g, Rfl: 1  EXAM:  Filed Vitals:   07/07/13 1357  BP: 130/62  Pulse: 92  Temp: 98 F (36.7 C)    Body mass index is 23.37 kg/(m^2).  GENERAL: vitals reviewed and listed above, alert, oriented, appears well hydrated and in no acute distress  HEENT: atraumatic, conjunttiva clear, no obvious abnormalities on inspection of external nose and ears, normal appearance of ear canals and TMs, clear  nasal congestion and boggy turbinates, mild post oropharyngeal erythema with PND and cbblestoning, no tonsillar edema or exudate, no sinus TTP  NECK: no obvious masses on inspection  LUNGS: clear to auscultation bilaterally, no wheezes, rales or rhonchi, good air movement  CV: HRRR, no peripheral edema  MS: moves all extremities without noticeable abnormality  PSYCH: pleasant and cooperative, no obvious depression or anxiety  ASSESSMENT AND PLAN:  Discussed the following assessment and plan:  Allergic rhinitis - Plan: fluticasone (FLONASE) 50 MCG/ACT nasal spray  PND (post-nasal drip) - Plan: fluticasone (FLONASE) 50 MCG/ACT nasal spray  Cough  -given HPI and exam findings today, a serious infection or illness is unlikely. We discussed potential etiologies, with allergic rhinitis being most likely, and advised per orders and instructions. We discussed treatment side effects, likely course, antibiotic misuse, and signs of developing a serious illness. -advised follow up with PCP in 3-4 weeks -of course, we advised to return or notify a doctor immediately if symptoms worsen or persist or new concerns arise.    Patient Instructions  -start the flonase 2 sprays each nostril  -AFRIN per instructions for 4 days only - then STOP  -follow up with your doctor in 3-4 weeks or sooner if worsening or other concerns     Nicholas Patton

## 2013-07-07 NOTE — Telephone Encounter (Signed)
Patient Information:  Caller Name: Nicholas Patton  Phone: [phone]  Patient: Nicholas Patton, Nicholas Patton  Gender: Male  DOB: 1934/10/04  Age: 79 Years  PCP: Cathlean Cower (Adults only)  Office Follow Up:  Does the office need to follow up with this patient?: No  Instructions For The Office: N/A  RN Note:  [facility] had no appt. Appt with Dr. Maudie Mercury at [facility] on 07/07/13 at 2pm. Gave directions and #. Pt could not come before 2pm.  Symptoms  Reason For Call & Symptoms: Pt calling regarding coughing up phlegm since January.  Reviewed Health History In EMR: Yes  Reviewed Medications In EMR: Yes  Reviewed Allergies In EMR: Yes  Reviewed Surgeries / Procedures: Yes  Date of Onset of Symptoms: 07/06/2013  Treatments Tried: trx with round of antibx on 06/25/13. Had a refill since January.  Treatments Tried Worked: No  Guideline(s) Used:  Cough  Disposition Per Guideline:   Home Care  Reason For Disposition Reached:   Cough with no complications  Advice Given:  N/A  Patient Will Follow Care Advice:  YES

## 2013-07-14 ENCOUNTER — Other Ambulatory Visit (INDEPENDENT_AMBULATORY_CARE_PROVIDER_SITE_OTHER): Payer: Medicare Other

## 2013-07-14 ENCOUNTER — Encounter: Payer: Self-pay | Admitting: Internal Medicine

## 2013-07-14 ENCOUNTER — Ambulatory Visit (INDEPENDENT_AMBULATORY_CARE_PROVIDER_SITE_OTHER): Payer: Medicare Other | Admitting: Internal Medicine

## 2013-07-14 VITALS — BP 130/62 | HR 79 | Temp 98.1°F | Ht 75.0 in | Wt 185.1 lb

## 2013-07-14 DIAGNOSIS — E119 Type 2 diabetes mellitus without complications: Secondary | ICD-10-CM

## 2013-07-14 DIAGNOSIS — R350 Frequency of micturition: Secondary | ICD-10-CM

## 2013-07-14 DIAGNOSIS — I10 Essential (primary) hypertension: Secondary | ICD-10-CM | POA: Diagnosis not present

## 2013-07-14 LAB — URINALYSIS, ROUTINE W REFLEX MICROSCOPIC
Bilirubin Urine: NEGATIVE
Hgb urine dipstick: NEGATIVE
Ketones, ur: NEGATIVE
Leukocytes, UA: NEGATIVE
Nitrite: NEGATIVE
RBC / HPF: NONE SEEN (ref 0–?)
Specific Gravity, Urine: 1.015 (ref 1.000–1.030)
Total Protein, Urine: NEGATIVE
Urine Glucose: NEGATIVE
Urobilinogen, UA: 0.2 (ref 0.0–1.0)
pH: 6 (ref 5.0–8.0)

## 2013-07-14 MED ORDER — CEPHALEXIN 500 MG PO CAPS: 500.0000 mg | ORAL_CAPSULE | Freq: Four times a day (QID) | ORAL | Status: DC

## 2013-07-14 MED ORDER — DOXYCYCLINE HYCLATE 100 MG PO TABS: 100.0000 mg | ORAL_TABLET | Freq: Two times a day (BID) | ORAL | Status: DC

## 2013-07-14 MED ORDER — TAMSULOSIN HCL 0.4 MG PO CAPS
0.4000 mg | ORAL_CAPSULE | Freq: Every day | ORAL | Status: DC
Start: 2013-07-14 — End: 2013-07-26

## 2013-07-14 NOTE — Patient Instructions (Addendum)
Please take all new medication as prescribed- the flomax  If not improved by Monday, please start the antibiotic  Please go to the LAB in the Basement (turn left off the elevator) for the tests to be done today - just the urine testing today  You will be contacted by phone if any changes need to be made immediately.  Otherwise, you will receive a letter about your results with an explanation, but please check with MyChart first.  Please remember to sign up for MyChart if you have not done so, as this will be important to you in the future with finding out test results, communicating by private email, and scheduling acute appointments online when needed.  Please keep your appointments with your specialists as you have planned - urology

## 2013-07-14 NOTE — Progress Notes (Signed)
Subjective:    Patient ID: Nicholas Patton, male    DOB: 05-17-1934, 79 y.o.   MRN: [id number]  HPI  Here with acute onset urinary frequency x 3 wks, seemed to start after last visit with urology just prior to that;  Has had some success with flomax in past, not taking recently.  Denies urinary symptoms such as dysuria, urgency, flank pain, hematuria or n/v, fever, chills. Noturia 1-2 times per night, but seems to be every 1-2 hrs during the day. Has not been drinking more fluids recently.  Sugar relatively well controlled.  No hx of OAB.   Pt denies fever, wt loss, night sweats, loss of appetite, or other constitutional symptoms  Does have occasional mild urinary leakage/incont as well.   Past Medical History  Diagnosis Date  . ALLERGIC RHINITIS 01/24/2007  . BENIGN PROSTATIC HYPERTROPHY 01/24/2007  . BRONCHITIS, ACUTE 06/11/2008  . Cough 01/24/2007  . DIABETES MELLITUS, TYPE II 01/24/2007  . FATIGUE 07/27/2007  . HYPERLIPIDEMIA 01/24/2007  . HYPERTENSION 10/05/2006  . INSOMNIA 06/21/2008  . LEG PAIN, BILATERAL 10/06/2007  . PERIPHERAL NEUROPATHY, FEET 10/06/2007  . PERS HX TOBACCO USE PRESENTING HAZARDS HEALTH 09/17/2009  . VITAMIN B12 DEFICIENCY 09/06/2008  . VITAMIN D DEFICIENCY 01/16/2010  . Wheezing 01/16/2010  . Cholecystitis, acute 07/22/2012  . Ileus following gastrointestinal surgery 07/22/2012  . Postoperative fever 07/22/2012  . Erectile dysfunction 09/22/2012  . Peripheral neuropathy    Past Surgical History  Procedure Laterality Date  . Total knee arthroplasty  2001    Left  . Turp vaporization    . Cholecystectomy N/A 07/18/2012    Procedure: LAPAROSCOPIC CHOLECYSTECTOMY WITH INTRAOPERATIVE CHOLANGIOGRAM;  Surgeon: Zenovia Jarred, MD;  Location: [place];  Service: General;  Laterality: N/A;    reports that he quit smoking about 20 years ago. His smoking use included Cigarettes. He has a 35 pack-year smoking history. He has never used smokeless tobacco. He reports that he does not  drink alcohol or use illicit drugs. family history includes Diabetes Mellitus II in his mother; Lung cancer in his brother. No Known Allergies Current Outpatient Prescriptions on File Prior to Visit  Medication Sig Dispense Refill  . amitriptyline (ELAVIL) 50 MG tablet Take 1 tablet (50 mg total) by mouth at bedtime as needed for sleep.  90 tablet  1  . aspirin 81 MG EC tablet Take 81 mg by mouth daily.        Marland Kitchen atorvastatin (LIPITOR) 40 MG tablet Take 1 tablet (40 mg total) by mouth daily.  90 tablet  3  . Blood Glucose Monitoring Suppl (ONE TOUCH ULTRA SYSTEM KIT) W/DEVICE KIT 1 kit by Does not apply route once.  1 each  0  . Cholecalciferol (VITAMIN D3) 1000 UNITS CAPS Take 1 capsule (1,000 Units total) by mouth daily.  90 capsule  3  . fluticasone (FLONASE) 50 MCG/ACT nasal spray Place 2 sprays into both nostrils daily.  16 g  1  . glimepiride (AMARYL) 1 MG tablet Take 0.5 tablets (0.5 mg total) by mouth 2 (two) times daily before a meal.  180 tablet  3  . glucose blood (ONE TOUCH TEST STRIPS) test strip Use as directed once daily, diagnosis code 250.00  100 each  11  . losartan-hydrochlorothiazide (HYZAAR) 100-25 MG per tablet Take 1 tablet by mouth daily.  90 tablet  3  . metFORMIN (GLUCOPHAGE) 500 MG tablet Take 2 tablets (1,000 mg total) by mouth 2 (two) times daily with a meal.  360 tablet  3  . omeprazole (PRILOSEC) 20 MG capsule Take 1 capsule (20 mg total) by mouth daily.  90 capsule  3   No current facility-administered medications on file prior to visit.   Review of Systems  Constitutional: Negative for unusual diaphoresis or other sweats  HENT: Negative for ringing in ear Eyes: Negative for double vision or worsening visual disturbance.  Respiratory: Negative for choking and stridor.   Gastrointestinal: Negative for vomiting or other signifcant bowel change Genitourinary: Negative for hematuria or decreased urine volume.  Musculoskeletal: Negative for other MSK pain or  swelling Skin: Negative for color change and worsening wound.  Neurological: Negative for tremors and numbness other than noted  Psychiatric/Behavioral: Negative for decreased concentration or agitation other than above       Objective:   Physical Exam BP 130/62  Pulse 79  Temp(Src) 98.1 F (36.7 C) (Oral)  Ht _0  (1.905 m)  Wt 185 lb 2 oz (83.972 kg)  BMI 23.14 kg/m2  SpO2 98% VS noted,  Constitutional: Pt appears well-developed, well-nourished.  HENT: Head: NCAT.  Right Ear: External ear normal.  Left Ear: External ear normal.  Eyes: . Pupils are equal, round, and reactive to light. Conjunctivae and EOM are normal Neck: Normal range of motion. Neck supple.  Cardiovascular: Normal rate and regular rhythm.   Pulmonary/Chest: Effort normal and breath sounds normal.  Abd:  Soft, NT, ND, + BS GU: normal male phallus, scrotum nontneder, nonswelling Neurological: Pt is alert. Not confused , motor grossly intact Skin: Skin is warm. No rash Psychiatric: Pt behavior is normal. No agitation.     Assessment & Plan:

## 2013-07-14 NOTE — Progress Notes (Signed)
Pre visit review using our clinic review tool, if applicable. No additional management support is needed unless otherwise documented below in the visit note.

## 2013-07-15 LAB — URINE CULTURE
Colony Count: NO GROWTH
Organism ID, Bacteria: NO GROWTH

## 2013-07-15 NOTE — Assessment & Plan Note (Signed)
stable overall by history and exam, recent data reviewed with pt, and pt to continue medical treatment as before,  to f/u any worsening symptoms or concerns BP Readings from Last 3 Encounters:  07/14/13 130/62  07/07/13 130/62  05/23/13 132/70

## 2013-07-15 NOTE — Assessment & Plan Note (Signed)
Etiology unclear, diff includes BPH, prostatitis, UTI, DM, OAB.; for urine studies today, to try flomax re-start, but for antibx if not improved or develops fever in 1-2 days

## 2013-07-15 NOTE — Assessment & Plan Note (Signed)
stable overall by history and exam, recent data reviewed with pt, and pt to continue medical treatment as before,  to f/u any worsening symptoms or concerns Lab Results  Component Value Date   HGBA1C 6.7* 03/22/2013

## 2013-07-25 ENCOUNTER — Telehealth: Payer: Self-pay | Admitting: *Deleted

## 2013-07-25 NOTE — Telephone Encounter (Signed)
Forrest for OV tomorrow if not fever or pain

## 2013-07-25 NOTE — Telephone Encounter (Signed)
Pt called states he is having continued frequent urination.  Please advise

## 2013-07-25 NOTE — Telephone Encounter (Signed)
Notified pt with md response. Pt denied pain or fever. Made appt for tomorrow @ 5:00...Nicholas Patton

## 2013-07-26 ENCOUNTER — Other Ambulatory Visit (INDEPENDENT_AMBULATORY_CARE_PROVIDER_SITE_OTHER): Payer: Medicare Other

## 2013-07-26 ENCOUNTER — Telehealth: Payer: Self-pay

## 2013-07-26 ENCOUNTER — Ambulatory Visit (INDEPENDENT_AMBULATORY_CARE_PROVIDER_SITE_OTHER): Payer: Medicare Other | Admitting: Internal Medicine

## 2013-07-26 ENCOUNTER — Encounter: Payer: Self-pay | Admitting: Internal Medicine

## 2013-07-26 VITALS — BP 130/70 | HR 69 | Temp 98.1°F | Wt 184.2 lb

## 2013-07-26 DIAGNOSIS — E119 Type 2 diabetes mellitus without complications: Secondary | ICD-10-CM | POA: Diagnosis not present

## 2013-07-26 DIAGNOSIS — I10 Essential (primary) hypertension: Secondary | ICD-10-CM

## 2013-07-26 DIAGNOSIS — R350 Frequency of micturition: Secondary | ICD-10-CM

## 2013-07-26 LAB — URINALYSIS
Bilirubin Urine: NEGATIVE
Hgb urine dipstick: NEGATIVE
Ketones, ur: NEGATIVE
Leukocytes, UA: NEGATIVE
Nitrite: NEGATIVE
Specific Gravity, Urine: >=1.03 — AB (ref 1.000–1.030)
Total Protein, Urine: NEGATIVE
Urine Glucose: NEGATIVE
Urobilinogen, UA: 0.2 (ref 0.0–1.0)
pH: 6 (ref 5.0–8.0)

## 2013-07-26 MED ORDER — SOLIFENACIN SUCCINATE 5 MG PO TABS: 5.0000 mg | ORAL_TABLET | Freq: Every day | ORAL | Status: DC

## 2013-07-26 NOTE — Progress Notes (Signed)
Pre visit review using our clinic review tool, if applicable. No additional management support is needed unless otherwise documented below in the visit note.

## 2013-07-26 NOTE — Progress Notes (Signed)
Subjective:    Patient ID: Nicholas Patton, male    DOB: 09-04-1934, 79 y.o.   MRN: [id number]  HPI  Here to f/u,c/o ongoing urinary freq, recent UA and culture neg,  Denies urinary symptoms such as dysuria,  urgency, flank pain, hematuria or n/v, fever, chills, or incontinence.  Seemed to start just after last urology appt when symptoms not present, now urinating every 1-2 hrs, with nocturia up to 2-4 times per night, disrupting sleep.  Flomax no help.  Has known BPH with hx of microwave tx.  Pt denies chest pain, increased sob or doe, wheezing, orthopnea, PND, increased LE swelling, palpitations, dizziness or syncope.  Past Medical History  Diagnosis Date  . ALLERGIC RHINITIS 01/24/2007  . BENIGN PROSTATIC HYPERTROPHY 01/24/2007  . BRONCHITIS, ACUTE 06/11/2008  . Cough 01/24/2007  . DIABETES MELLITUS, TYPE II 01/24/2007  . FATIGUE 07/27/2007  . HYPERLIPIDEMIA 01/24/2007  . HYPERTENSION 10/05/2006  . INSOMNIA 06/21/2008  . LEG PAIN, BILATERAL 10/06/2007  . PERIPHERAL NEUROPATHY, FEET 10/06/2007  . PERS HX TOBACCO USE PRESENTING HAZARDS HEALTH 09/17/2009  . VITAMIN B12 DEFICIENCY 09/06/2008  . VITAMIN D DEFICIENCY 01/16/2010  . Wheezing 01/16/2010  . Cholecystitis, acute 07/22/2012  . Ileus following gastrointestinal surgery 07/22/2012  . Postoperative fever 07/22/2012  . Erectile dysfunction 09/22/2012  . Peripheral neuropathy    Past Surgical History  Procedure Laterality Date  . Total knee arthroplasty  2001    Left  . Turp vaporization    . Cholecystectomy N/A 07/18/2012    Procedure: LAPAROSCOPIC CHOLECYSTECTOMY WITH INTRAOPERATIVE CHOLANGIOGRAM;  Surgeon: Zenovia Jarred, MD;  Location: [place];  Service: General;  Laterality: N/A;    reports that he quit smoking about 20 years ago. His smoking use included Cigarettes. He has a 35 pack-year smoking history. He has never used smokeless tobacco. He reports that he does not drink alcohol or use illicit drugs. family history includes Diabetes  Mellitus II in his mother; Lung cancer in his brother. No Known Allergies Current Outpatient Prescriptions on File Prior to Visit  Medication Sig Dispense Refill  . amitriptyline (ELAVIL) 50 MG tablet Take 1 tablet (50 mg total) by mouth at bedtime as needed for sleep.  90 tablet  1  . aspirin 81 MG EC tablet Take 81 mg by mouth daily.        Marland Kitchen atorvastatin (LIPITOR) 40 MG tablet Take 1 tablet (40 mg total) by mouth daily.  90 tablet  3  . Blood Glucose Monitoring Suppl (ONE TOUCH ULTRA SYSTEM KIT) W/DEVICE KIT 1 kit by Does not apply route once.  1 each  0  . Cholecalciferol (VITAMIN D3) 1000 UNITS CAPS Take 1 capsule (1,000 Units total) by mouth daily.  90 capsule  3  . fluticasone (FLONASE) 50 MCG/ACT nasal spray Place 2 sprays into both nostrils daily.  16 g  1  . glimepiride (AMARYL) 1 MG tablet Take 0.5 tablets (0.5 mg total) by mouth 2 (two) times daily before a meal.  180 tablet  3  . glucose blood (ONE TOUCH TEST STRIPS) test strip Use as directed once daily, diagnosis code 250.00  100 each  11  . losartan-hydrochlorothiazide (HYZAAR) 100-25 MG per tablet Take 1 tablet by mouth daily.  90 tablet  3  . metFORMIN (GLUCOPHAGE) 500 MG tablet Take 2 tablets (1,000 mg total) by mouth 2 (two) times daily with a meal.  360 tablet  3  . omeprazole (PRILOSEC) 20 MG capsule Take 1 capsule (20 mg  total) by mouth daily.  90 capsule  3   No current facility-administered medications on file prior to visit.   Review of Systems  Constitutional: Negative for unusual diaphoresis or other sweats  HENT: Negative for ringing in ear Eyes: Negative for double vision or worsening visual disturbance.  Respiratory: Negative for choking and stridor.   Gastrointestinal: Negative for vomiting or other signifcant bowel change Genitourinary: Negative for hematuria or decreased urine volume.  Musculoskeletal: Negative for other MSK pain or swelling Skin: Negative for color change and worsening wound.    Neurological: Negative for tremors and numbness other than noted  Psychiatric/Behavioral: Negative for decreased concentration or agitation other than above       Objective:   Physical Exam BP 130/70  Pulse 69  Temp(Src) 98.1 F (36.7 C) (Oral)  Wt 184 lb 4 oz (83.575 kg)  SpO2 96% VS noted,  Constitutional: Pt appears well-developed, well-nourished.  HENT: Head: NCAT.  Right Ear: External ear normal.  Left Ear: External ear normal.  Eyes: . Pupils are equal, round, and reactive to light. Conjunctivae and EOM are normal Neck: Normal range of motion. Neck supple.  Cardiovascular: Normal rate and regular rhythm.   Pulmonary/Chest: Effort normal and breath sounds normal.  Abd:  Soft, NT, ND, + BS, no flank tender Neurological: Pt is alert. Not confused , motor grossly intact Skin: Skin is warm. No rash Psychiatric: Pt behavior is normal. No agitation.      Assessment & Plan:

## 2013-07-26 NOTE — Patient Instructions (Signed)
Please take all new medication as prescribed - the vesicare 5 mg per day  Please continue all other medications as before, and refills have been done if requested. Please have the pharmacy call with any other refills you may need.  Please call if the prescription is too expensive

## 2013-07-26 NOTE — Telephone Encounter (Signed)
ua ordered.

## 2013-07-31 NOTE — Assessment & Plan Note (Signed)
?  OAB, for trial vesicare 5 qd , gave 1 mo samples and rx, also for urology f/u. As planned, also for repeat UA today

## 2013-07-31 NOTE — Assessment & Plan Note (Addendum)
stable overall by history and exam, recent data reviewed with pt, and pt to continue medical treatment as before,  to f/u any worsening symptoms or concerns BP Readings from Last 3 Encounters:  07/26/13 130/70  07/14/13 130/62  07/07/13 130/62   Consider d/c hct as part of BP control, but has not been an issue in past

## 2013-07-31 NOTE — Assessment & Plan Note (Signed)
stable overall by history and exam, recent data reviewed with pt, and pt to continue medical treatment as before,  to f/u any worsening symptoms or concerns Lab Results  Component Value Date   HGBA1C 6.7* 03/22/2013   Not likely to have increased sugar as element for polyuria

## 2013-08-08 ENCOUNTER — Telehealth: Payer: Self-pay | Admitting: *Deleted

## 2013-08-08 ENCOUNTER — Encounter: Payer: Self-pay | Admitting: Podiatry

## 2013-08-08 ENCOUNTER — Ambulatory Visit (INDEPENDENT_AMBULATORY_CARE_PROVIDER_SITE_OTHER): Payer: Medicare Other | Admitting: Podiatry

## 2013-08-08 VITALS — BP 152/63 | HR 70

## 2013-08-08 DIAGNOSIS — B351 Tinea unguium: Secondary | ICD-10-CM

## 2013-08-08 DIAGNOSIS — E1142 Type 2 diabetes mellitus with diabetic polyneuropathy: Secondary | ICD-10-CM

## 2013-08-08 DIAGNOSIS — M79609 Pain in unspecified limb: Secondary | ICD-10-CM

## 2013-08-08 DIAGNOSIS — E1149 Type 2 diabetes mellitus with other diabetic neurological complication: Secondary | ICD-10-CM | POA: Diagnosis not present

## 2013-08-08 DIAGNOSIS — M79606 Pain in leg, unspecified: Secondary | ICD-10-CM

## 2013-08-08 MED ORDER — AMLODIPINE BESYLATE 5 MG PO TABS: 5.0000 mg | ORAL_TABLET | Freq: Every day | ORAL | Status: DC

## 2013-08-08 NOTE — Telephone Encounter (Signed)
Called informed the patient of MD instructions. Stated he would have to call on Thursday as will be out of town on Friday.  He did want MD to know BP over the past 5 days, 153/63, 169/59, 149/64, 141/68 and today at his foot doctor it was 152/63.

## 2013-08-08 NOTE — Progress Notes (Signed)
Subjective: 79 year old diabetic male presents requesting toe nails and calluses trimmed. Diabetic for 6-7 years or longer and blood sugar was 105 yesterday.  Pain in between the first and 2nd digit from build up corn. Wears Gel toe spreader and still hurts.   Objective: Pedal pulses are all palpable.  Thick mycotic nails x 10. Plantar callus under the MPJ right.  No open lesions. No edema or erythema.  Inter digital keratoma 1st and 2nd digit at contact surface left foot.  Severe hallux valgus with bunion bilateral.  Severely contracted 2nd digit left.   Assessment: Diabetic neuropathy.  Hallux valgus with bunion bilateral.  Severe contracted hammer toe deformity 2nd left.  Painful interdigital corn 1st and 2nd digit left.  Mycotic nails x 10.  Plan: Debrided all corns and calluses and nails.  Patient will return in 3 months for palliation.

## 2013-08-08 NOTE — Telephone Encounter (Signed)
Pt called states since taking Vesicare his BP has been elevated.  Please advise

## 2013-08-08 NOTE — Telephone Encounter (Signed)
Ok to add amlodipine 5 mg  Please let him know this is very easy to take, but it normally takes 3 wks to get full effect.  OK to not call later this wk  Please call in BP's in 3 wks

## 2013-08-08 NOTE — Telephone Encounter (Signed)
This should not be due to the vesicare as this medication does not do this  OK to cont to monitor BP 2 times per day, and call Friday with results if > 140/90

## 2013-08-08 NOTE — Patient Instructions (Signed)
Seen for hypertrophic nails. All nails debrided. Return in 3 months or as needed.

## 2013-08-09 NOTE — Telephone Encounter (Signed)
Called the patient informed of MD instructions on taking new medication.  Patient understood and agreed to all instructions.

## 2013-08-09 NOTE — Telephone Encounter (Signed)
Called left message to call back 

## 2013-08-15 ENCOUNTER — Ambulatory Visit: Payer: Medicare Other | Admitting: Podiatry

## 2013-09-19 ENCOUNTER — Ambulatory Visit: Payer: Medicare Other | Admitting: Internal Medicine

## 2013-09-27 ENCOUNTER — Ambulatory Visit (INDEPENDENT_AMBULATORY_CARE_PROVIDER_SITE_OTHER): Payer: Medicare Other | Admitting: Internal Medicine

## 2013-09-27 ENCOUNTER — Encounter: Payer: Self-pay | Admitting: Internal Medicine

## 2013-09-27 VITALS — BP 118/72 | HR 71 | Temp 98.3°F | Ht 75.0 in | Wt 184.2 lb

## 2013-09-27 DIAGNOSIS — E119 Type 2 diabetes mellitus without complications: Secondary | ICD-10-CM

## 2013-09-27 DIAGNOSIS — E785 Hyperlipidemia, unspecified: Secondary | ICD-10-CM | POA: Diagnosis not present

## 2013-09-27 DIAGNOSIS — K219 Gastro-esophageal reflux disease without esophagitis: Secondary | ICD-10-CM

## 2013-09-27 DIAGNOSIS — N318 Other neuromuscular dysfunction of bladder: Secondary | ICD-10-CM

## 2013-09-27 DIAGNOSIS — N320 Bladder-neck obstruction: Secondary | ICD-10-CM | POA: Diagnosis not present

## 2013-09-27 DIAGNOSIS — I10 Essential (primary) hypertension: Secondary | ICD-10-CM

## 2013-09-27 DIAGNOSIS — N3281 Overactive bladder: Secondary | ICD-10-CM | POA: Insufficient documentation

## 2013-09-27 MED ORDER — OMEPRAZOLE 20 MG PO CPDR: DELAYED_RELEASE_CAPSULE | ORAL | Status: DC

## 2013-09-27 NOTE — Assessment & Plan Note (Signed)
stable overall by history and exam, recent data reviewed with pt, and pt to continue medical treatment as before,  to f/u any worsening symptoms or concerns Lab Results  Component Value Date   HGBA1C 6.7* 03/22/2013

## 2013-09-27 NOTE — Assessment & Plan Note (Addendum)
Ok to incr the prilosec to 20 mg - 2 qd, consider protonix 40 if not improved,  to f/u any worsening symptoms or concerns  Note:  Total time for pt hx, exam, review of record with pt in the room, determination of diagnoses and plan for further eval and tx is > 40 min, with over 50% spent in coordination and counseling of patient

## 2013-09-27 NOTE — Assessment & Plan Note (Signed)
Also for psa as he is due

## 2013-09-27 NOTE — Progress Notes (Signed)
Subjective:    Patient ID: Nicholas Patton, male    DOB: August 08, 1934, 79 y.o.   MRN: [id number]  HPI  Here to f/u; overall doing ok,  Pt denies chest pain, increased sob or doe, wheezing, orthopnea, PND, increased LE swelling, palpitations, dizziness or syncope.  Pt denies polydipsia, polyuria, or low sugar symptoms such as weakness or confusion improved with po intake.  Pt denies new neurological symptoms such as new headache, or facial or extremity weakness or numbness.   Pt states overall good compliance with meds, has been trying to follow lower cholesterol, diabetic diet, with wt overall stable.   Still with nocturia 2-3 times per night with some sleep disruption.  Has seen urology Dr Jeffie Pollock with microwave BPH tx, no improvement with flomax and antibx in the past. Vesicare seems to help the most. Bp improved with taking the amlodipine.  Has had some increased breakthrough reflux recently on once daily prilosec but Denies abd pain, dysphagia, n/v, bowel change or blood.  Past Medical History  Diagnosis Date  . ALLERGIC RHINITIS 01/24/2007  . BENIGN PROSTATIC HYPERTROPHY 01/24/2007  . BRONCHITIS, ACUTE 06/11/2008  . Cough 01/24/2007  . DIABETES MELLITUS, TYPE II 01/24/2007  . FATIGUE 07/27/2007  . HYPERLIPIDEMIA 01/24/2007  . HYPERTENSION 10/05/2006  . INSOMNIA 06/21/2008  . LEG PAIN, BILATERAL 10/06/2007  . PERIPHERAL NEUROPATHY, FEET 10/06/2007  . PERS HX TOBACCO USE PRESENTING HAZARDS HEALTH 09/17/2009  . VITAMIN B12 DEFICIENCY 09/06/2008  . VITAMIN D DEFICIENCY 01/16/2010  . Wheezing 01/16/2010  . Cholecystitis, acute 07/22/2012  . Ileus following gastrointestinal surgery 07/22/2012  . Postoperative fever 07/22/2012  . Erectile dysfunction 09/22/2012  . Peripheral neuropathy    Past Surgical History  Procedure Laterality Date  . Total knee arthroplasty  2001    Left  . Turp vaporization    . Cholecystectomy N/A 07/18/2012    Procedure: LAPAROSCOPIC CHOLECYSTECTOMY WITH INTRAOPERATIVE  CHOLANGIOGRAM;  Surgeon: Zenovia Jarred, MD;  Location: [place];  Service: General;  Laterality: N/A;    reports that he quit smoking about 20 years ago. His smoking use included Cigarettes. He has a 35 pack-year smoking history. He has never used smokeless tobacco. He reports that he does not drink alcohol or use illicit drugs. family history includes Diabetes Mellitus II in his mother; Lung cancer in his brother. No Known Allergies Current Outpatient Prescriptions on File Prior to Visit  Medication Sig Dispense Refill  . amitriptyline (ELAVIL) 50 MG tablet Take 1 tablet (50 mg total) by mouth at bedtime as needed for sleep.  90 tablet  1  . amLODipine (NORVASC) 5 MG tablet Take 1 tablet (5 mg total) by mouth daily.  90 tablet  3  . aspirin 81 MG EC tablet Take 81 mg by mouth daily.        Marland Kitchen atorvastatin (LIPITOR) 40 MG tablet Take 1 tablet (40 mg total) by mouth daily.  90 tablet  3  . Blood Glucose Monitoring Suppl (ONE TOUCH ULTRA SYSTEM KIT) W/DEVICE KIT 1 kit by Does not apply route once.  1 each  0  . Cholecalciferol (VITAMIN D3) 1000 UNITS CAPS Take 1 capsule (1,000 Units total) by mouth daily.  90 capsule  3  . fluticasone (FLONASE) 50 MCG/ACT nasal spray Place 2 sprays into both nostrils daily.  16 g  1  . glimepiride (AMARYL) 1 MG tablet Take 0.5 tablets (0.5 mg total) by mouth 2 (two) times daily before a meal.  180 tablet  3  .  glucose blood (ONE TOUCH TEST STRIPS) test strip Use as directed once daily, diagnosis code 250.00  100 each  11  . losartan-hydrochlorothiazide (HYZAAR) 100-25 MG per tablet Take 1 tablet by mouth daily.  90 tablet  3  . metFORMIN (GLUCOPHAGE) 500 MG tablet Take 2 tablets (1,000 mg total) by mouth 2 (two) times daily with a meal.  360 tablet  3  . solifenacin (VESICARE) 5 MG tablet Take 1 tablet (5 mg total) by mouth daily.  30 tablet  11   No current facility-administered medications on file prior to visit.    Review of Systems  Constitutional: Negative  for unusual diaphoresis or other sweats  HENT: Negative for ringing in ear Eyes: Negative for double vision or worsening visual disturbance.  Respiratory: Negative for choking and stridor.   Gastrointestinal: Negative for vomiting or other signifcant bowel change Genitourinary: Negative for hematuria or decreased urine volume.  Musculoskeletal: Negative for other MSK pain or swelling Skin: Negative for color change and worsening wound.  Neurological: Negative for tremors and numbness other than noted  Psychiatric/Behavioral: Negative for decreased concentration or agitation other than above       Objective:   Physical Exam BP 118/72  Pulse 71  Temp(Src) 98.3 F (36.8 C) (Oral)  Ht _0  (1.905 m)  Wt 184 lb 4 oz (83.575 kg)  BMI 23.03 kg/m2  SpO2 96% VS noted,  Constitutional: Pt appears well-developed, well-nourished.  HENT: Head: NCAT.  Right Ear: External ear normal.  Left Ear: External ear normal.  Eyes: . Pupils are equal, round, and reactive to light. Conjunctivae and EOM are normal Neck: Normal range of motion. Neck supple.  Cardiovascular: Normal rate and regular rhythm.   Pulmonary/Chest: Effort normal and breath sounds normal.  Neurological: Pt is alert. Not confused , motor grossly intact Skin: Skin is warm. No rash Psychiatric: Pt behavior is normal. No agitation.     Assessment & Plan:

## 2013-09-27 NOTE — Assessment & Plan Note (Signed)
stable overall by history and exam, recent data reviewed with pt, and pt to continue medical treatment as before,  to f/u any worsening symptoms or concerns Lab Results  Component Value Date   LDLCALC 48 03/22/2013

## 2013-09-27 NOTE — Progress Notes (Signed)
Pre visit review using our clinic review tool, if applicable. No additional management support is needed unless otherwise documented below in the visit note.

## 2013-09-27 NOTE — Assessment & Plan Note (Signed)
stable overall by history and exam, recent data reviewed with pt, and pt to continue medical treatment as before,  to f/u any worsening symptoms or concerns BP Readings from Last 3 Encounters:  09/27/13 118/72  08/08/13 152/63  07/26/13 130/70

## 2013-09-27 NOTE — Patient Instructions (Addendum)
Ok to increase the prilosec to 2 per day  If not working well, please call for change to protonix 40 mg per day  Please continue all other medications as before, and refills have been done if requested.  Please have the pharmacy call with any other refills you may need.  Please continue your efforts at being more active, low cholesterol diet, and weight control.  You are otherwise up to date with prevention measures today.  Please keep your appointments with your specialists as you may have planned  Please go to the LAB in the Basement (turn left off the elevator) for the tests to be done today  You will be contacted by phone if any changes need to be made immediately.  Otherwise, you will receive a letter about your results with an explanation, but please check with MyChart first.  Please remember to sign up for MyChart if you have not done so, as this will be important to you in the future with finding out test results, communicating by private email, and scheduling acute appointments online when needed.  Please return in 6 months, or sooner if needed

## 2013-09-27 NOTE — Assessment & Plan Note (Signed)
Improved with vesicare,  to f/u any worsening symptoms or concerns

## 2013-09-28 ENCOUNTER — Other Ambulatory Visit (INDEPENDENT_AMBULATORY_CARE_PROVIDER_SITE_OTHER): Payer: Medicare Other

## 2013-09-28 DIAGNOSIS — N320 Bladder-neck obstruction: Secondary | ICD-10-CM | POA: Diagnosis not present

## 2013-09-28 DIAGNOSIS — E785 Hyperlipidemia, unspecified: Secondary | ICD-10-CM | POA: Diagnosis not present

## 2013-09-28 DIAGNOSIS — E119 Type 2 diabetes mellitus without complications: Secondary | ICD-10-CM | POA: Diagnosis not present

## 2013-09-28 LAB — BASIC METABOLIC PANEL
BUN: 24 mg/dL — ABNORMAL HIGH (ref 6–23)
CO2: 31 mEq/L (ref 19–32)
Calcium: 10.1 mg/dL (ref 8.4–10.5)
Chloride: 104 mEq/L (ref 96–112)
Creatinine, Ser: 1.3 mg/dL (ref 0.4–1.5)
GFR: 67.02 mL/min (ref >60.00)
Glucose, Bld: 101 mg/dL — ABNORMAL HIGH (ref 70–99)
Potassium: 4.9 mEq/L (ref 3.5–5.1)
Sodium: 140 mEq/L (ref 135–145)

## 2013-09-28 LAB — MICROALBUMIN / CREATININE URINE RATIO
Creatinine,U: 89.6 mg/dL
Microalb Creat Ratio: 0.2 mg/g (ref 0.0–30.0)
Microalb, Ur: 0.2 mg/dL (ref 0.0–1.9)

## 2013-09-28 LAB — CBC WITH DIFFERENTIAL/PLATELET
Basophils Absolute: 0 10*3/uL (ref 0.0–0.1)
Basophils Relative: 0.6 % (ref 0.0–3.0)
Eosinophils Absolute: 0.2 10*3/uL (ref 0.0–0.7)
Eosinophils Relative: 4.2 % (ref 0.0–5.0)
HCT: 38.7 % — ABNORMAL LOW (ref 39.0–52.0)
Hemoglobin: 12.6 g/dL — ABNORMAL LOW (ref 13.0–17.0)
Lymphocytes Relative: 36.1 % (ref 12.0–46.0)
Lymphs Abs: 1.5 10*3/uL (ref 0.7–4.0)
MCHC: 32.4 g/dL (ref 30.0–36.0)
MCV: 85.4 fl (ref 78.0–100.0)
Monocytes Absolute: 0.4 10*3/uL (ref 0.1–1.0)
Monocytes Relative: 9.5 % (ref 3.0–12.0)
Neutro Abs: 2 10*3/uL (ref 1.4–7.7)
Neutrophils Relative %: 49.6 % (ref 43.0–77.0)
Platelets: 208 10*3/uL (ref 150.0–400.0)
RBC: 4.53 Mil/uL (ref 4.22–5.81)
RDW: 15.3 % (ref 11.5–15.5)
WBC: 4.1 10*3/uL (ref 4.0–10.5)

## 2013-09-28 LAB — PSA: PSA: 2.31 ng/mL (ref 0.10–4.00)

## 2013-09-28 LAB — HEPATIC FUNCTION PANEL
ALT: 13 U/L (ref 0–53)
AST: 21 U/L (ref 0–37)
Albumin: 4.2 g/dL (ref 3.5–5.2)
Alkaline Phosphatase: 78 U/L (ref 39–117)
Bilirubin, Direct: 0.1 mg/dL (ref 0.0–0.3)
Total Bilirubin: 0.7 mg/dL (ref 0.2–1.2)
Total Protein: 6.7 g/dL (ref 6.0–8.3)

## 2013-09-28 LAB — URINALYSIS, ROUTINE W REFLEX MICROSCOPIC
Bilirubin Urine: NEGATIVE
Hgb urine dipstick: NEGATIVE
Ketones, ur: NEGATIVE
Leukocytes, UA: NEGATIVE
Nitrite: NEGATIVE
RBC / HPF: NONE SEEN (ref 0–?)
Specific Gravity, Urine: 1.01 (ref 1.000–1.030)
Total Protein, Urine: NEGATIVE
Urine Glucose: NEGATIVE
Urobilinogen, UA: 0.2 (ref 0.0–1.0)
WBC, UA: NONE SEEN — AB (ref 0–?)
pH: 6 (ref 5.0–8.0)

## 2013-09-28 LAB — LIPID PANEL
Cholesterol: 113 mg/dL (ref 0–200)
HDL: 49.2 mg/dL (ref >39.00)
LDL Cholesterol: 49 mg/dL (ref 0–99)
NonHDL: 63.8
Total CHOL/HDL Ratio: 2
Triglycerides: 74 mg/dL (ref 0.0–149.0)
VLDL: 14.8 mg/dL (ref 0.0–40.0)

## 2013-09-28 LAB — TSH: TSH: 2.92 u[IU]/mL (ref 0.35–4.50)

## 2013-09-28 LAB — HEMOGLOBIN A1C: Hgb A1c MFr Bld: 6.6 % — ABNORMAL HIGH (ref 4.6–6.5)

## 2013-10-13 ENCOUNTER — Telehealth: Payer: Self-pay

## 2013-10-13 MED ORDER — PANTOPRAZOLE SODIUM 40 MG PO TBEC: 40.0000 mg | DELAYED_RELEASE_TABLET | Freq: Every day | ORAL | Status: DC

## 2013-10-13 NOTE — Telephone Encounter (Signed)
Called the patient informed medication changed and sent to [place] [place].  The patient will hold on referral and first try protonix.  If it does not work he will call back and request a GI referral.

## 2013-10-13 NOTE — Telephone Encounter (Signed)
Ok to try change to protonix 40 qd,  I can also refer to GI if pt would want to do this

## 2013-10-13 NOTE — Telephone Encounter (Signed)
The patient has been taking Prilosec as prescribed, but is not working.  Advise on alternative that would help.

## 2013-10-27 ENCOUNTER — Ambulatory Visit (INDEPENDENT_AMBULATORY_CARE_PROVIDER_SITE_OTHER): Payer: Medicare Other | Admitting: Podiatry

## 2013-10-27 ENCOUNTER — Encounter: Payer: Self-pay | Admitting: Podiatry

## 2013-10-27 VITALS — BP 138/58 | HR 64 | Ht 75.0 in | Wt 184.0 lb

## 2013-10-27 DIAGNOSIS — M79609 Pain in unspecified limb: Secondary | ICD-10-CM | POA: Diagnosis not present

## 2013-10-27 DIAGNOSIS — B351 Tinea unguium: Secondary | ICD-10-CM | POA: Diagnosis not present

## 2013-10-27 DIAGNOSIS — M79606 Pain in leg, unspecified: Secondary | ICD-10-CM

## 2013-10-27 NOTE — Patient Instructions (Signed)
Seen for hypertrophic nails. All nails debrided. Return in 3 months or as needed.

## 2013-10-27 NOTE — Progress Notes (Signed)
Subjective:  79 year old diabetic male presents requesting toe nails and calluses trimmed. Both great toes are painful from thick pinching nails.   Objective: Pedal pulses are all palpable.  Thick mycotic nails x 10. Mildly ingrown nails on both great toes. Plantar callus under the MPJ right.  No open lesions. No edema or erythema.  Inter digital keratoma 1st and 2nd digit at contact surface left foot.  Severe hallux valgus with bunion bilateral.  Severely contracted 2nd digit left.   Assessment: Diabetic neuropathy.  Hallux valgus with bunion bilateral.  Severe contracted hammer toe deformity 2nd left.  Painful ingrown nails both great toes.  Mycotic nails x 10.   Plan: Debrided all corns and calluses and nails.  Patient will return in 3 months for palliation.

## 2013-11-07 ENCOUNTER — Ambulatory Visit: Payer: Medicare Other | Admitting: Podiatry

## 2013-12-08 ENCOUNTER — Ambulatory Visit (INDEPENDENT_AMBULATORY_CARE_PROVIDER_SITE_OTHER): Payer: Medicare Other

## 2013-12-08 DIAGNOSIS — Z23 Encounter for immunization: Secondary | ICD-10-CM | POA: Diagnosis not present

## 2014-01-29 ENCOUNTER — Encounter: Payer: Self-pay | Admitting: Podiatry

## 2014-01-29 ENCOUNTER — Ambulatory Visit (INDEPENDENT_AMBULATORY_CARE_PROVIDER_SITE_OTHER): Payer: Medicare Other | Admitting: Podiatry

## 2014-01-29 VITALS — BP 165/66 | HR 73

## 2014-01-29 DIAGNOSIS — B351 Tinea unguium: Secondary | ICD-10-CM

## 2014-01-29 DIAGNOSIS — M79606 Pain in leg, unspecified: Secondary | ICD-10-CM

## 2014-01-29 NOTE — Patient Instructions (Signed)
Seen for hypertrophic nails. All nails debrided. Return in 3 months or as needed.  

## 2014-01-29 NOTE — Progress Notes (Signed)
Subjective:  79 year old diabetic male presents requesting toe nails and calluses trimmed.  Both great toes are painful from thick callus on side of nail.   Objective: Pedal pulses are all palpable.  Thick mycotic nails x 10. Mildly ingrown nails on both great toes. Plantar callus under the MPJ right.  No open lesions. No edema or erythema.  Inter digital keratoma 1st and 2nd digit at contact surface left foot.  Severe hallux valgus with bunion bilateral.  Severely contracted 2nd digit left.   Assessment: Diabetic neuropathy.  Hallux valgus with bunion bilateral.  Severe contracted hammer toe deformity 2nd left.  Painful ingrown nails both great toes.  Mycotic nails x 10.   Plan: Debrided all corns and calluses and nails.  Patient will return in 3 months for palliation.

## 2014-03-21 DIAGNOSIS — M25561 Pain in right knee: Secondary | ICD-10-CM | POA: Diagnosis not present

## 2014-03-21 DIAGNOSIS — M1711 Unilateral primary osteoarthritis, right knee: Secondary | ICD-10-CM | POA: Diagnosis not present

## 2014-03-30 ENCOUNTER — Ambulatory Visit: Payer: Medicare Other | Admitting: Internal Medicine

## 2014-04-06 ENCOUNTER — Other Ambulatory Visit (INDEPENDENT_AMBULATORY_CARE_PROVIDER_SITE_OTHER): Payer: Medicare Other

## 2014-04-06 ENCOUNTER — Encounter: Payer: Self-pay | Admitting: Internal Medicine

## 2014-04-06 ENCOUNTER — Ambulatory Visit (INDEPENDENT_AMBULATORY_CARE_PROVIDER_SITE_OTHER): Payer: Medicare Other | Admitting: Internal Medicine

## 2014-04-06 VITALS — BP 134/68 | HR 71 | Temp 97.8°F | Ht 74.0 in | Wt 184.2 lb

## 2014-04-06 DIAGNOSIS — N3281 Overactive bladder: Secondary | ICD-10-CM

## 2014-04-06 DIAGNOSIS — I10 Essential (primary) hypertension: Secondary | ICD-10-CM

## 2014-04-06 DIAGNOSIS — E119 Type 2 diabetes mellitus without complications: Secondary | ICD-10-CM

## 2014-04-06 DIAGNOSIS — E785 Hyperlipidemia, unspecified: Secondary | ICD-10-CM

## 2014-04-06 LAB — HEPATIC FUNCTION PANEL
ALT: 9 U/L (ref 0–53)
AST: 18 U/L (ref 0–37)
Albumin: 4.2 g/dL (ref 3.5–5.2)
Alkaline Phosphatase: 99 U/L (ref 39–117)
Bilirubin, Direct: 0.1 mg/dL (ref 0.0–0.3)
Total Bilirubin: 0.3 mg/dL (ref 0.2–1.2)
Total Protein: 6.9 g/dL (ref 6.0–8.3)

## 2014-04-06 LAB — BASIC METABOLIC PANEL
BUN: 22 mg/dL (ref 6–23)
CO2: 28 mEq/L (ref 19–32)
Calcium: 9.9 mg/dL (ref 8.4–10.5)
Chloride: 105 mEq/L (ref 96–112)
Creatinine, Ser: 1.31 mg/dL (ref 0.40–1.50)
GFR: 68.11 mL/min (ref >60.00)
Glucose, Bld: 74 mg/dL (ref 70–99)
Potassium: 4.3 mEq/L (ref 3.5–5.1)
Sodium: 142 mEq/L (ref 135–145)

## 2014-04-06 LAB — LIPID PANEL
Cholesterol: 119 mg/dL (ref 0–200)
HDL: 55.4 mg/dL (ref >39.00)
LDL Cholesterol: 47 mg/dL (ref 0–99)
NonHDL: 63.6
Total CHOL/HDL Ratio: 2
Triglycerides: 81 mg/dL (ref 0.0–149.0)
VLDL: 16.2 mg/dL (ref 0.0–40.0)

## 2014-04-06 LAB — HEMOGLOBIN A1C: Hgb A1c MFr Bld: 6.9 % — ABNORMAL HIGH (ref 4.6–6.5)

## 2014-04-06 MED ORDER — ATORVASTATIN CALCIUM 40 MG PO TABS: 40.0000 mg | ORAL_TABLET | Freq: Every day | ORAL | Status: DC

## 2014-04-06 MED ORDER — GLIMEPIRIDE 1 MG PO TABS: 0.5000 mg | ORAL_TABLET | Freq: Two times a day (BID) | ORAL | Status: DC

## 2014-04-06 MED ORDER — OXYBUTYNIN CHLORIDE ER 10 MG PO TB24: 10.0000 mg | ORAL_TABLET | Freq: Every day | ORAL | Status: DC

## 2014-04-06 MED ORDER — AMLODIPINE BESYLATE 5 MG PO TABS: 5.0000 mg | ORAL_TABLET | Freq: Every day | ORAL | Status: DC

## 2014-04-06 MED ORDER — LOSARTAN POTASSIUM-HCTZ 100-25 MG PO TABS: 1.0000 | ORAL_TABLET | Freq: Every day | ORAL | Status: DC

## 2014-04-06 MED ORDER — PANTOPRAZOLE SODIUM 40 MG PO TBEC: 40.0000 mg | DELAYED_RELEASE_TABLET | Freq: Every day | ORAL | Status: DC

## 2014-04-06 MED ORDER — METFORMIN HCL 500 MG PO TABS: 1000.0000 mg | ORAL_TABLET | Freq: Two times a day (BID) | ORAL | Status: DC

## 2014-04-06 NOTE — Progress Notes (Signed)
Pre visit review using our clinic review tool, if applicable. No additional management support is needed unless otherwise documented below in the visit note.

## 2014-04-06 NOTE — Progress Notes (Signed)
Subjective:    Patient ID: Nicholas Patton, male    DOB: 08/18/1934, 80 y.o.   MRN: [id number]  HPI  Here to f/u; overall doing ok,  Pt denies chest pain, increased sob or doe, wheezing, orthopnea, PND, increased LE swelling, palpitations, dizziness or syncope.  Pt denies polydipsia, polyuria, or low sugar symptoms such as weakness or confusion improved with po intake.  Pt denies new neurological symptoms such as new headache, or facial or extremity weakness or numbness.   Pt states overall good compliance with meds, has been trying to follow lower cholesterol, diabetic diet, with wt overall stable,  but little exercise however.Wt overall stable  S/p cortisone to right knee per [place] ortho recent, pain some improved. Wt Readings from Last 3 Encounters:  04/06/14 184 lb 4 oz (83.575 kg)  10/27/13 184 lb (83.462 kg)  09/27/13 184 lb 4 oz (83.575 kg)  asks for change vesicare due to dry mouth, generic if possible Past Medical History  Diagnosis Date  . ALLERGIC RHINITIS 01/24/2007  . BENIGN PROSTATIC HYPERTROPHY 01/24/2007  . BRONCHITIS, ACUTE 06/11/2008  . Cough 01/24/2007  . DIABETES MELLITUS, TYPE II 01/24/2007  . FATIGUE 07/27/2007  . HYPERLIPIDEMIA 01/24/2007  . HYPERTENSION 10/05/2006  . INSOMNIA 06/21/2008  . LEG PAIN, BILATERAL 10/06/2007  . PERIPHERAL NEUROPATHY, FEET 10/06/2007  . PERS HX TOBACCO USE PRESENTING HAZARDS HEALTH 09/17/2009  . VITAMIN B12 DEFICIENCY 09/06/2008  . VITAMIN D DEFICIENCY 01/16/2010  . Wheezing 01/16/2010  . Cholecystitis, acute 07/22/2012  . Ileus following gastrointestinal surgery 07/22/2012  . Postoperative fever 07/22/2012  . Erectile dysfunction 09/22/2012  . Peripheral neuropathy    Past Surgical History  Procedure Laterality Date  . Total knee arthroplasty  2001    Left  . Turp vaporization    . Cholecystectomy N/A 07/18/2012    Procedure: LAPAROSCOPIC CHOLECYSTECTOMY WITH INTRAOPERATIVE CHOLANGIOGRAM;  Surgeon: Zenovia Jarred, MD;  Location: [place];   Service: General;  Laterality: N/A;    reports that he quit smoking about 20 years ago. His smoking use included Cigarettes. He has a 35 pack-year smoking history. He has never used smokeless tobacco. He reports that he does not drink alcohol or use illicit drugs. family history includes Diabetes Mellitus II in his mother; Lung cancer in his brother. No Known Allergies Current Outpatient Prescriptions on File Prior to Visit  Medication Sig Dispense Refill  . amitriptyline (ELAVIL) 50 MG tablet Take 1 tablet (50 mg total) by mouth at bedtime as needed for sleep. 90 tablet 1  . aspirin 81 MG EC tablet Take 81 mg by mouth daily.      . Blood Glucose Monitoring Suppl (ONE TOUCH ULTRA SYSTEM KIT) W/DEVICE KIT 1 kit by Does not apply route once. 1 each 0  . Cholecalciferol (VITAMIN D3) 1000 UNITS CAPS Take 1 capsule (1,000 Units total) by mouth daily. 90 capsule 3  . fluticasone (FLONASE) 50 MCG/ACT nasal spray Place 2 sprays into both nostrils daily. 16 g 1  . glucose blood (ONE TOUCH TEST STRIPS) test strip Use as directed once daily, diagnosis code 250.00 100 each 11  . omeprazole (PRILOSEC) 20 MG capsule     . solifenacin (VESICARE) 5 MG tablet Take 1 tablet (5 mg total) by mouth daily. 30 tablet 11   No current facility-administered medications on file prior to visit.     Review of Systems  Constitutional: Negative for unusual diaphoresis or other sweats  HENT: Negative for ringing in ear Eyes: Negative for  double vision or worsening visual disturbance.  Respiratory: Negative for choking and stridor.   Gastrointestinal: Negative for vomiting or other signifcant bowel change Genitourinary: Negative for hematuria or decreased urine volume.  Musculoskeletal: Negative for other MSK pain or swelling Skin: Negative for color change and worsening wound.  Neurological: Negative for tremors and numbness other than noted  Psychiatric/Behavioral: Negative for decreased concentration or agitation  other than above       Objective:   Physical Exam BP 134/68 mmHg  Pulse 71  Temp(Src) 97.8 F (36.6 C) (Oral)  Ht 6' 2" (1.88 m)  Wt 184 lb 4 oz (83.575 kg)  BMI 23.65 kg/m2  SpO2 93% VS noted,  Constitutional: Pt appears well-developed, well-nourished.  HENT: Head: NCAT.  Right Ear: External ear normal.  Left Ear: External ear normal.  Eyes: . Pupils are equal, round, and reactive to light. Conjunctivae and EOM are normal Neck: Normal range of motion. Neck supple.  Cardiovascular: Normal rate and regular rhythm.   Pulmonary/Chest: Effort normal and breath sounds without rales or wheezing.  Abd:  Soft, NT, ND, + BS Neurological: Pt is alert. Not confused , motor grossly intact Skin: Skin is warm. No rash Psychiatric: Pt behavior is normal. No agitation.      Assessment & Plan:

## 2014-04-06 NOTE — Patient Instructions (Signed)
OK to stop the vesicare  Please take all new medication as prescribed - the generic for Ditropan XL  Please continue all other medications as before, and refills have been done if requested.  Please have the pharmacy call with any other refills you may need.  Please continue your efforts at being more active, low cholesterol diet, and weight control.  Please keep your appointments with your specialists as you may have planned  Please go to the LAB in the Basement (turn left off the elevator) for the tests to be done today  You will be contacted by phone if any changes need to be made immediately.  Otherwise, you will receive a letter about your results with an explanation, but please check with MyChart first.  Please remember to sign up for MyChart if you have not done so, as this will be important to you in the future with finding out test results, communicating by private email, and scheduling acute appointments online when needed.  Please return in 6 months, or sooner if needed

## 2014-04-07 NOTE — Assessment & Plan Note (Signed)
stable overall by history and exam, recent data reviewed with pt, and pt to continue medical treatment as before,  to f/u any worsening symptoms or concerns Lab Results  Component Value Date   LDLCALC 47 04/06/2014

## 2014-04-07 NOTE — Assessment & Plan Note (Signed)
Ok for trial change vesicare to oxybutinin ER due to insurance coverage -  to f/u any worsening symptoms or concerns

## 2014-04-07 NOTE — Assessment & Plan Note (Signed)
stable overall by history and exam, recent data reviewed with pt, and pt to continue medical treatment as before,  to f/u any worsening symptoms or concerns Lab Results  Component Value Date   HGBA1C 6.9* 04/06/2014

## 2014-04-07 NOTE — Assessment & Plan Note (Signed)
stable overall by history and exam, recent data reviewed with pt, and pt to continue medical treatment as before,  to f/u any worsening symptoms or concerns BP Readings from Last 3 Encounters:  04/06/14 134/68  01/29/14 165/66  10/27/13 138/58

## 2014-04-17 ENCOUNTER — Telehealth: Payer: Self-pay | Admitting: *Deleted

## 2014-04-17 NOTE — Telephone Encounter (Signed)
Need name of mail service

## 2014-04-17 NOTE — Telephone Encounter (Signed)
Left msg on triage stating the ditropan is too expensive. Would like to go back on vesicare 90 day supply to send to his mail service. Would like to pick rx up tomorrow...Nicholas Patton

## 2014-04-18 MED ORDER — SOLIFENACIN SUCCINATE 5 MG PO TABS: 5.0000 mg | ORAL_TABLET | Freq: Every day | ORAL | Status: DC

## 2014-04-18 NOTE — Telephone Encounter (Signed)
Called pt no answer [facility] rx ready for pick-up...Nicholas Patton

## 2014-04-18 NOTE — Telephone Encounter (Signed)
Done hardcopy to cindy if needed

## 2014-04-18 NOTE — Addendum Note (Signed)
Addended by: [name] on: 04/18/2014 08:58 PM   Modules accepted: Orders

## 2014-04-19 NOTE — Telephone Encounter (Signed)
rx faxed to [facility]

## 2014-04-20 DIAGNOSIS — H3531 Nonexudative age-related macular degeneration: Secondary | ICD-10-CM | POA: Diagnosis not present

## 2014-04-20 DIAGNOSIS — E119 Type 2 diabetes mellitus without complications: Secondary | ICD-10-CM | POA: Diagnosis not present

## 2014-04-20 DIAGNOSIS — H25813 Combined forms of age-related cataract, bilateral: Secondary | ICD-10-CM | POA: Diagnosis not present

## 2014-04-20 DIAGNOSIS — H04123 Dry eye syndrome of bilateral lacrimal glands: Secondary | ICD-10-CM | POA: Diagnosis not present

## 2014-04-20 DIAGNOSIS — H40013 Open angle with borderline findings, low risk, bilateral: Secondary | ICD-10-CM | POA: Diagnosis not present

## 2014-04-24 ENCOUNTER — Other Ambulatory Visit: Payer: Self-pay | Admitting: Internal Medicine

## 2014-04-24 ENCOUNTER — Other Ambulatory Visit: Payer: Self-pay | Admitting: *Deleted

## 2014-04-24 MED ORDER — AMLODIPINE BESYLATE 5 MG PO TABS: 5.0000 mg | ORAL_TABLET | Freq: Every day | ORAL | Status: DC

## 2014-04-24 MED ORDER — PANTOPRAZOLE SODIUM 40 MG PO TBEC: 40.0000 mg | DELAYED_RELEASE_TABLET | Freq: Every day | ORAL | Status: DC

## 2014-05-01 ENCOUNTER — Ambulatory Visit (INDEPENDENT_AMBULATORY_CARE_PROVIDER_SITE_OTHER): Payer: Medicare Other | Admitting: Podiatry

## 2014-05-01 ENCOUNTER — Encounter: Payer: Self-pay | Admitting: Podiatry

## 2014-05-01 VITALS — BP 130/60 | HR 80

## 2014-05-01 DIAGNOSIS — M79606 Pain in leg, unspecified: Secondary | ICD-10-CM

## 2014-05-01 DIAGNOSIS — B351 Tinea unguium: Secondary | ICD-10-CM

## 2014-05-01 NOTE — Patient Instructions (Signed)
Seen for hypertrophic nails and painful corns. All nails and corns debrided. Return in 3 months or as needed.

## 2014-05-01 NOTE — Progress Notes (Signed)
Subjective:  79 year old diabetic male presents requesting toe nails and calluses trimmed.  Both great toes are painful from thick callus on side of nail.   Objective: Pedal pulses are all palpable.  Thick mycotic nails x 10. Mildly ingrown nails on both great toes. Plantar callus under the MPJ right.  No open lesions. No edema or erythema.  Inter digital keratoma 1st and 2nd digit at contact surface left foot.  Severe hallux valgus with bunion bilateral.  Severely contracted 2nd digit left.   Assessment: Diabetic neuropathy.  Hallux valgus with bunion bilateral.  Severe contracted hammer toe deformity 2nd left.  Painful ingrown nails both great toes.  Mycotic nails x 10.   Plan: Debrided all corns and calluses and nails.  Patient will return in 3 months for palliation.  

## 2014-05-14 DIAGNOSIS — H2512 Age-related nuclear cataract, left eye: Secondary | ICD-10-CM | POA: Diagnosis not present

## 2014-05-14 DIAGNOSIS — H25812 Combined forms of age-related cataract, left eye: Secondary | ICD-10-CM | POA: Diagnosis not present

## 2014-05-15 ENCOUNTER — Encounter: Payer: Self-pay | Admitting: Internal Medicine

## 2014-05-23 IMAGING — RF DG CHOLANGIOGRAM OPERATIVE
1 series · 5 of 5 positions shown · non-contrast
Comparison: CT abdomen pelvis of 07/17/2012 and ultrasound abdomen
of the same date

CLINICAL DATA: Acute cholecystitis

INTRAOPERATIVE CHOLANGIOGRAM
TECHNIQUE: Multiple fluoroscopic spot radiographs were obtained
during intraoperative cholangiogram and are submitted for
interpretation post-operatively.

[Series 1: run · 2 acquisitions, 5 frames shown]
[im 1/2]
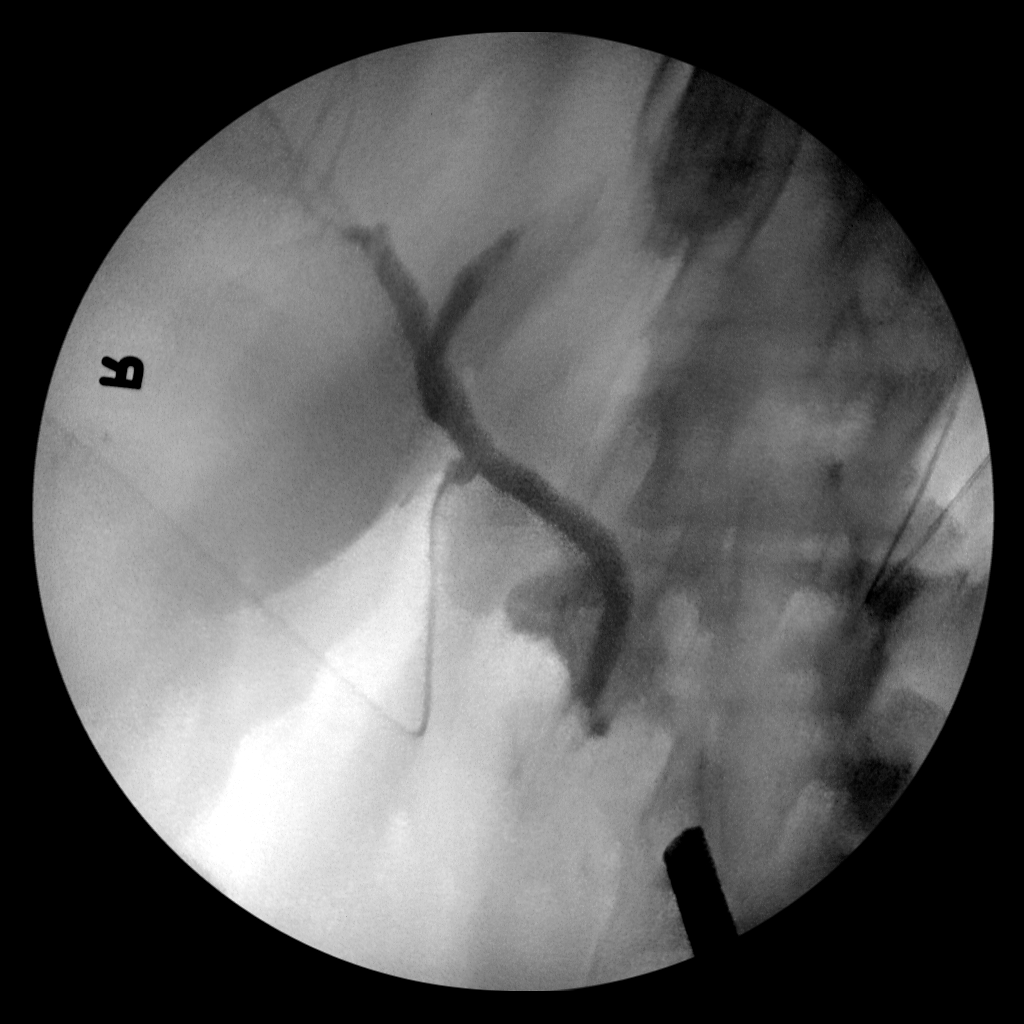
[im 1/2]
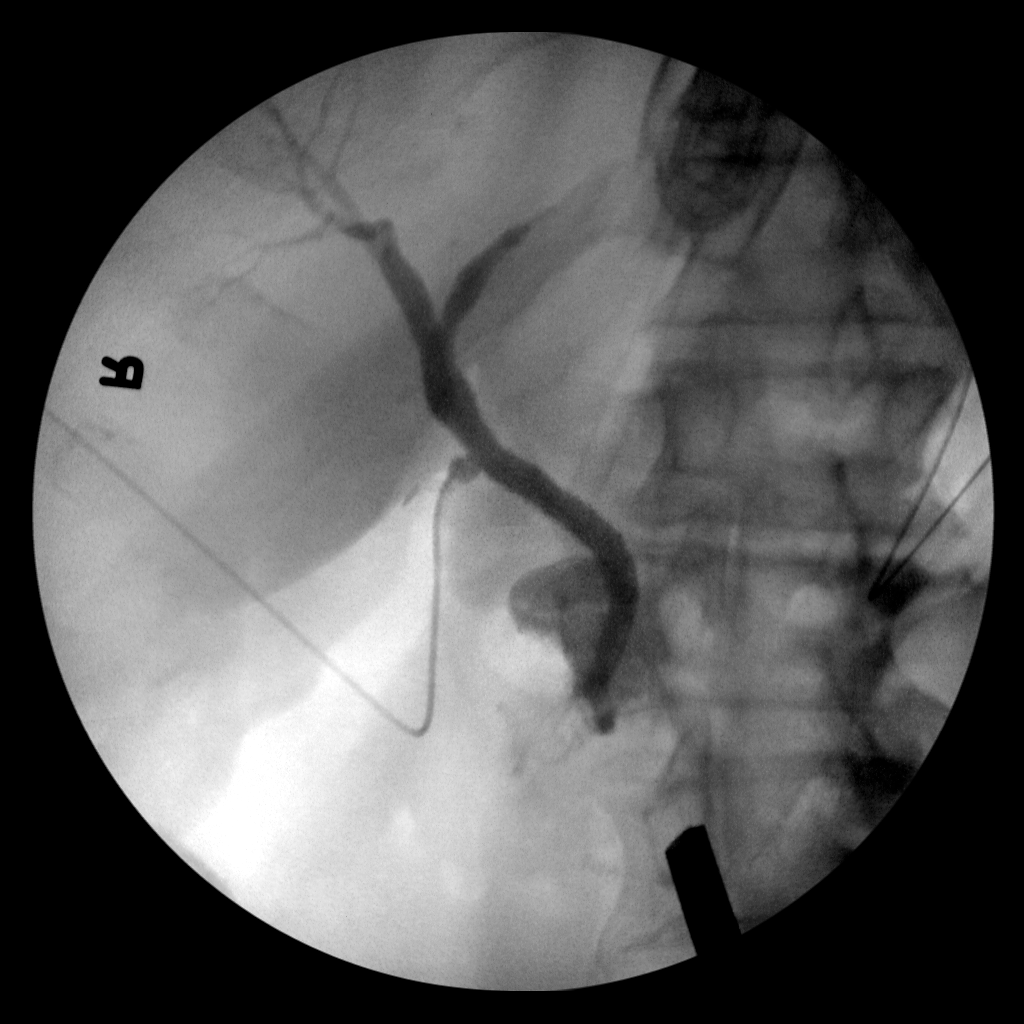
[im 1/2]
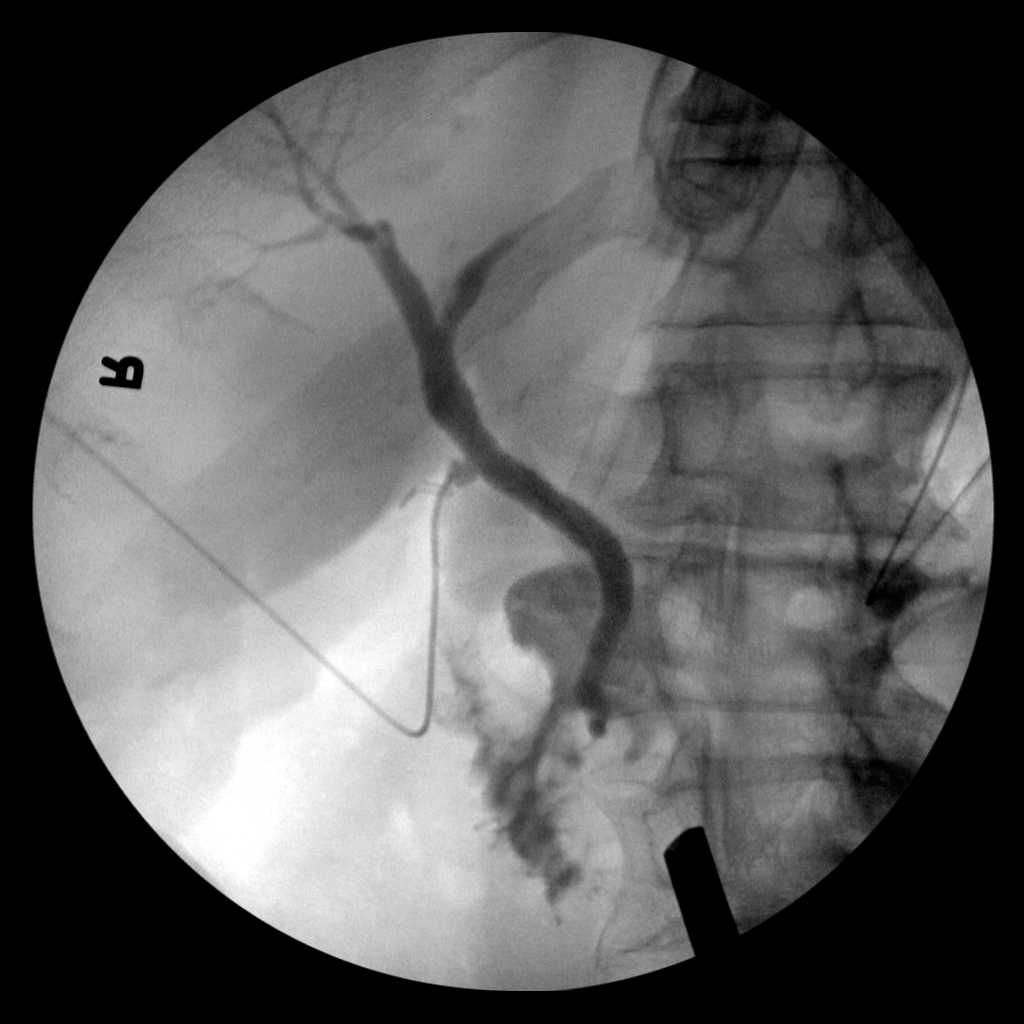
[im 1/2]
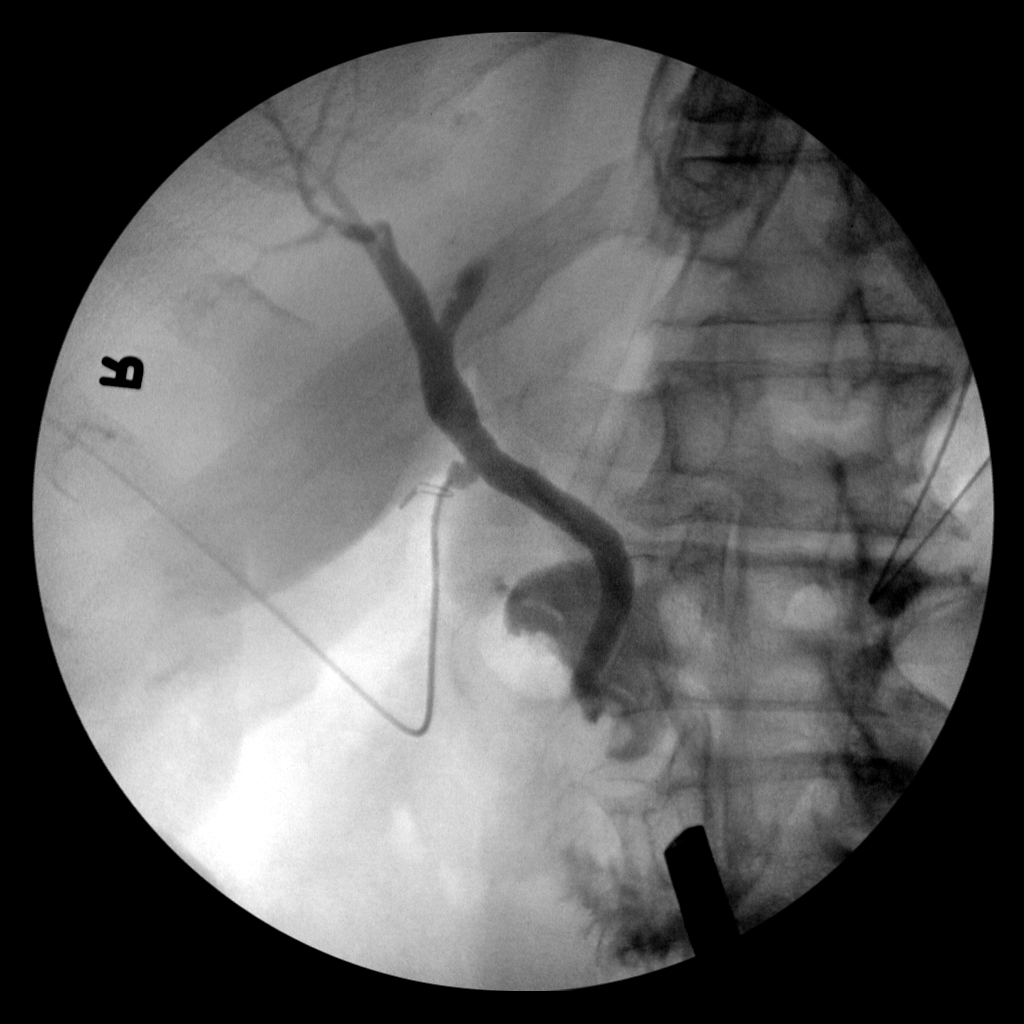
[im 2/2]
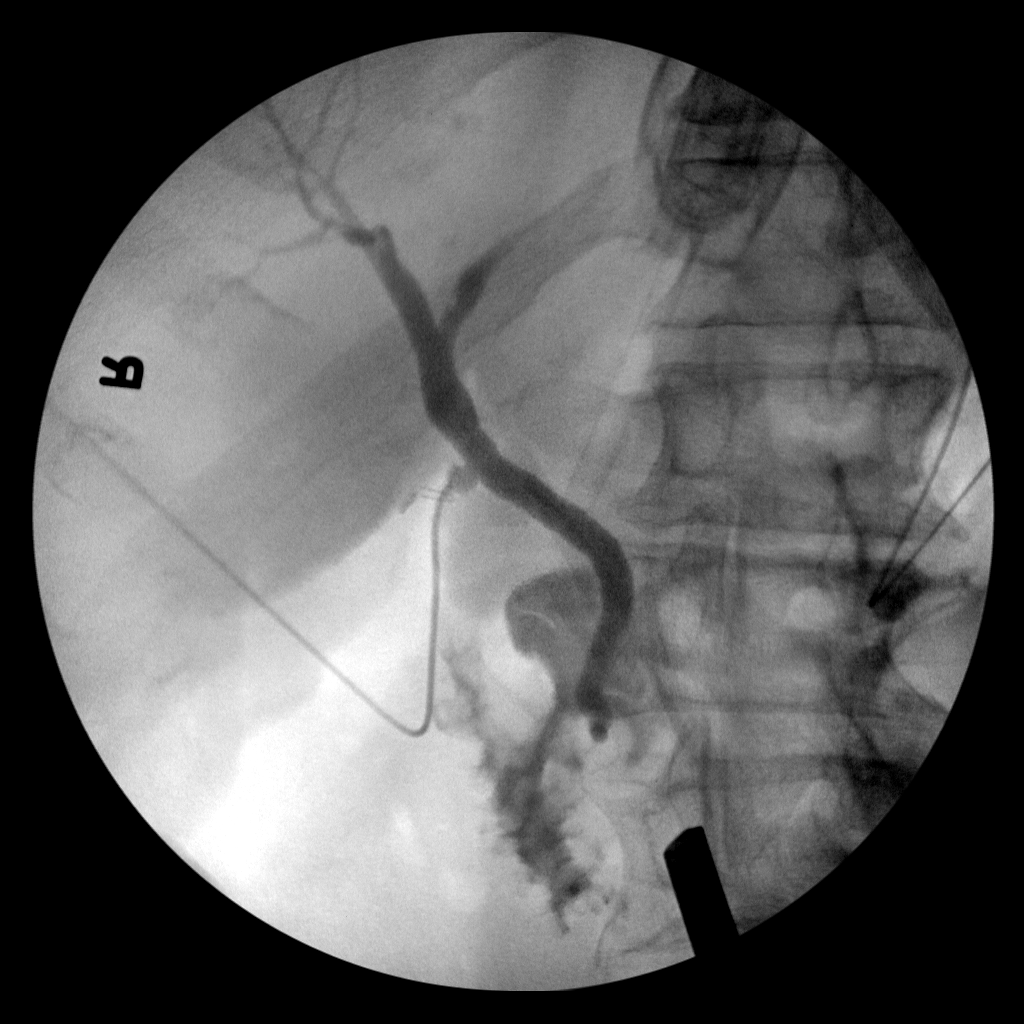

[5 of 5 positions shown; findings below may reference images not displayed]

FINDINGS: Contrast was injected via the cystic duct.  There is good
opacification of the common bile duct.  No filling defect is seen,
and there is free passage of contrast into the duodenal loop.  The
contrast does have a somewhat "shouldered" appearance within the
descending duodenum of questionable significance.  Has the patient
undergone recent endoscopy or upper GI on which this area would
have been visualized?
IMPRESSION: 1.  Negative intraoperative cholangiogram.
2.  Some shouldering of the mucosa of the descending duodenum of
questionable significance.  Correlate clinically.

## 2014-05-24 ENCOUNTER — Ambulatory Visit (INDEPENDENT_AMBULATORY_CARE_PROVIDER_SITE_OTHER): Payer: Medicare Other | Admitting: Internal Medicine

## 2014-05-24 ENCOUNTER — Encounter: Payer: Self-pay | Admitting: Internal Medicine

## 2014-05-24 VITALS — BP 136/70 | HR 63 | Temp 98.2°F | Resp 18 | Ht 74.0 in | Wt 185.6 lb

## 2014-05-24 DIAGNOSIS — J309 Allergic rhinitis, unspecified: Secondary | ICD-10-CM | POA: Diagnosis not present

## 2014-05-24 DIAGNOSIS — I10 Essential (primary) hypertension: Secondary | ICD-10-CM

## 2014-05-24 DIAGNOSIS — E119 Type 2 diabetes mellitus without complications: Secondary | ICD-10-CM | POA: Diagnosis not present

## 2014-05-24 IMAGING — CR DG CHEST 1V PORT
1 series · 1 of 1 positions shown · non-contrast
Comparison: 07/17/2012

CLINICAL DATA: Postop fever, cough.

PORTABLE CHEST - 1 VIEW

[AP]
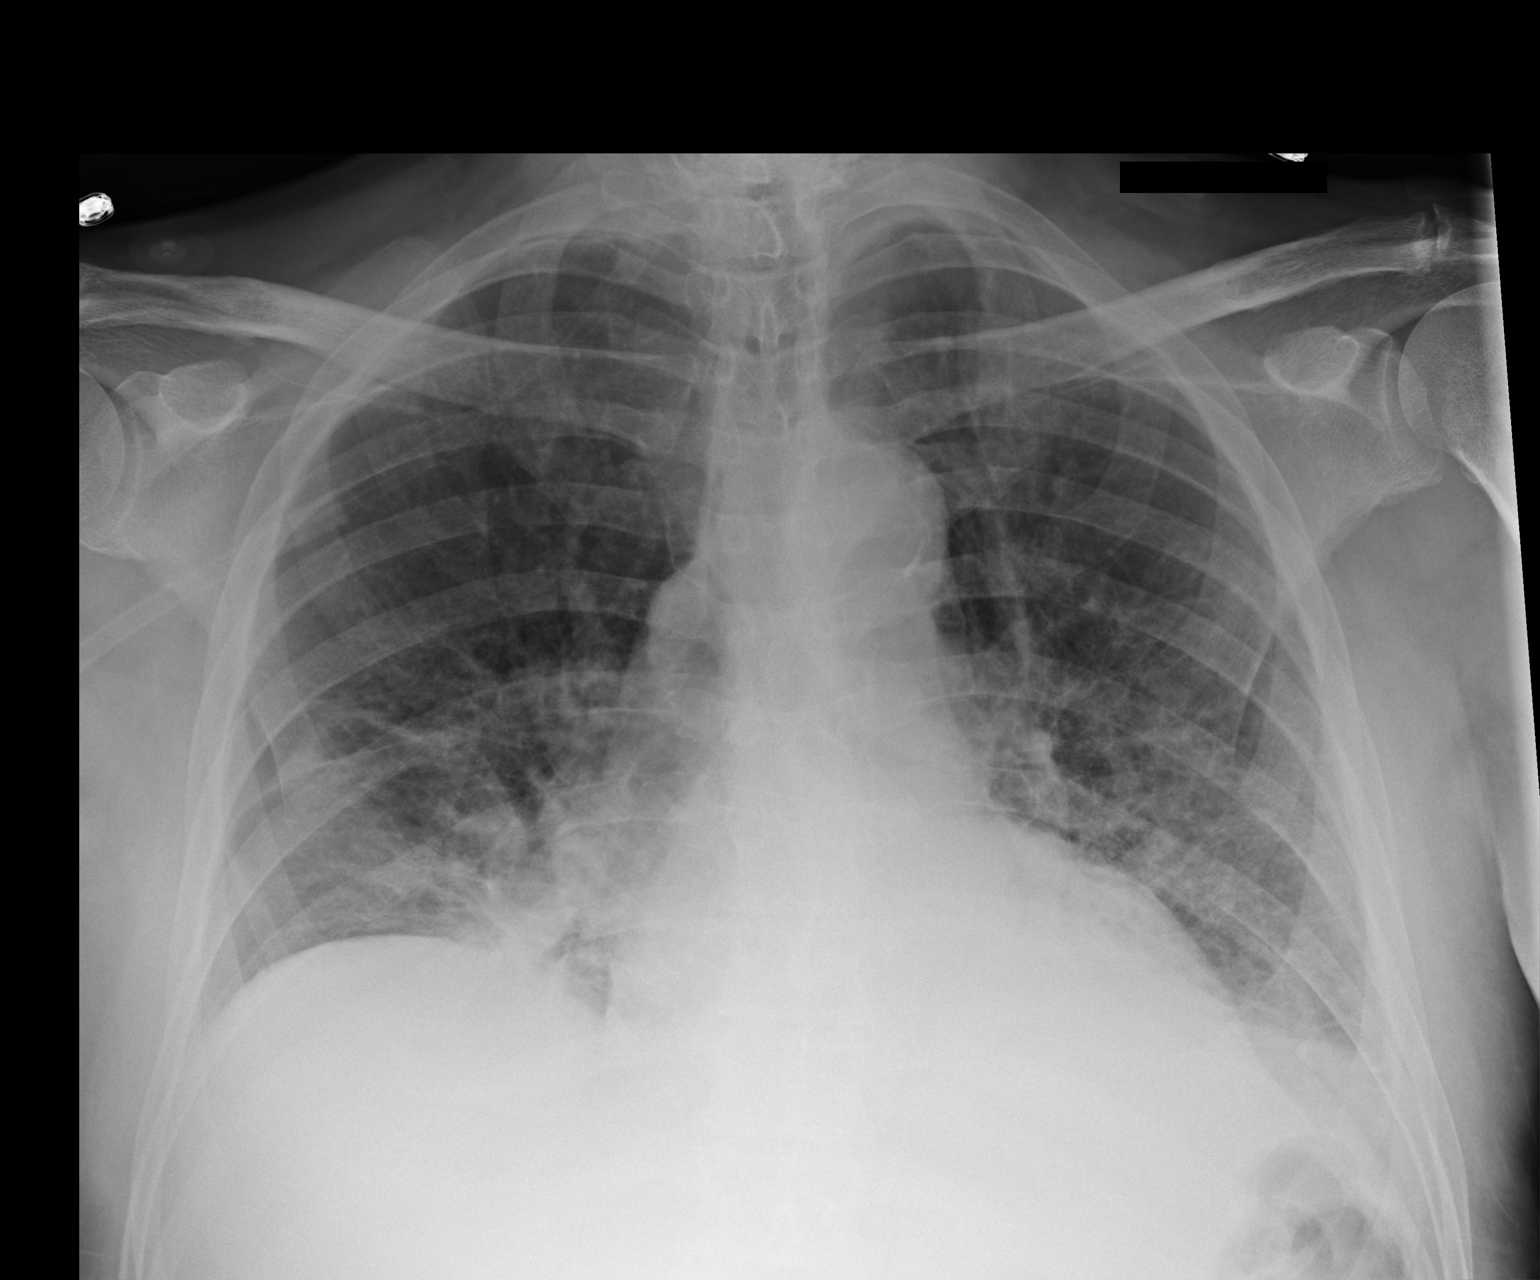

[1 of 1 positions shown; findings below may reference images not displayed]

FINDINGS: Bilateral lower lobe airspace opacities are noted,
increasing since prior study.  This could represent atelectasis or
pneumonia.  Mild vascular congestion.  Borderline heart size.  No
visible significant effusion.  No acute bony abnormality.
IMPRESSION: Increasing bilateral lower lobe atelectasis or consolidation.

## 2014-05-24 MED ORDER — METHYLPREDNISOLONE ACETATE 80 MG/ML IJ SUSP
80.0000 mg | Freq: Once | INTRAMUSCULAR | Status: AC
  Administered 2014-05-24: 80 mg via INTRAMUSCULAR

## 2014-05-24 MED ORDER — LEVOCETIRIZINE DIHYDROCHLORIDE 5 MG PO TABS: 5.0000 mg | ORAL_TABLET | Freq: Every evening | ORAL | Status: DC

## 2014-05-24 NOTE — Assessment & Plan Note (Signed)
stable overall by history and exam, recent data reviewed with pt, and pt to continue medical treatment as before,  to f/u any worsening symptoms or concerns Lab Results  Component Value Date   HGBA1C 6.9* 04/06/2014

## 2014-05-24 NOTE — Progress Notes (Signed)
Pre visit review using our clinic review tool, if applicable. No additional management support is needed unless otherwise documented below in the visit note.

## 2014-05-24 NOTE — Assessment & Plan Note (Signed)
stable overall by history and exam, recent data reviewed with pt, and pt to continue medical treatment as before,  to f/u any worsening symptoms or concerns le BP Readings from Last 3 Encounters:  05/24/14 136/70  05/01/14 130/60  04/06/14 134/68

## 2014-05-24 NOTE — Progress Notes (Signed)
Subjective:    Patient ID: Nicholas Patton, male    DOB: 06/22/1934, 80 y.o.   MRN: [id number]  HPI  Here to f/u, Does have several wks ongoing nasal allergy symptoms with clearish congestion, itch and sneezing, without fever, pain, ST, cough, swelling or wheezing. Did just have recent cataract surgury on left, has steroid drops for a short term Does not want to take his flonase in addition.  Pt denies new neurological symptoms such as new headache, or facial or extremity weakness or numbness   Pt denies polydipsia, polyuria, recent a1c 6.9. CBG's in low 100's.  Pt denies fever, wt loss, night sweats, loss of appetite, or other constitutional symptoms Past Medical History  Diagnosis Date  . ALLERGIC RHINITIS 01/24/2007  . BENIGN PROSTATIC HYPERTROPHY 01/24/2007  . BRONCHITIS, ACUTE 06/11/2008  . Cough 01/24/2007  . DIABETES MELLITUS, TYPE II 01/24/2007  . FATIGUE 07/27/2007  . HYPERLIPIDEMIA 01/24/2007  . HYPERTENSION 10/05/2006  . INSOMNIA 06/21/2008  . LEG PAIN, BILATERAL 10/06/2007  . PERIPHERAL NEUROPATHY, FEET 10/06/2007  . PERS HX TOBACCO USE PRESENTING HAZARDS HEALTH 09/17/2009  . VITAMIN B12 DEFICIENCY 09/06/2008  . VITAMIN D DEFICIENCY 01/16/2010  . Wheezing 01/16/2010  . Cholecystitis, acute 07/22/2012  . Ileus following gastrointestinal surgery 07/22/2012  . Postoperative fever 07/22/2012  . Erectile dysfunction 09/22/2012  . Peripheral neuropathy    Past Surgical History  Procedure Laterality Date  . Total knee arthroplasty  2001    Left  . Turp vaporization    . Cholecystectomy N/A 07/18/2012    Procedure: LAPAROSCOPIC CHOLECYSTECTOMY WITH INTRAOPERATIVE CHOLANGIOGRAM;  Surgeon: Zenovia Jarred, MD;  Location: [place];  Service: General;  Laterality: N/A;    reports that he quit smoking about 21 years ago. His smoking use included Cigarettes. He has a 35 pack-year smoking history. He has never used smokeless tobacco. He reports that he does not drink alcohol or use illicit  drugs. family history includes Diabetes Mellitus II in his mother; Lung cancer in his brother. No Known Allergies Current Outpatient Prescriptions on File Prior to Visit  Medication Sig Dispense Refill  . amLODipine (NORVASC) 5 MG tablet Take 1 tablet (5 mg total) by mouth daily. 90 tablet 3  . aspirin 81 MG EC tablet Take 81 mg by mouth daily.      Marland Kitchen atorvastatin (LIPITOR) 40 MG tablet Take 1 tablet by mouth  daily 90 tablet 3  . Cholecalciferol (VITAMIN D3) 1000 UNITS CAPS Take 1 capsule (1,000 Units total) by mouth daily. 90 capsule 3  . fluticasone (FLONASE) 50 MCG/ACT nasal spray Place 2 sprays into both nostrils daily. 16 g 1  . glimepiride (AMARYL) 1 MG tablet Take one-half tablet by  mouth two times daily  before meals 90 tablet 3  . losartan-hydrochlorothiazide (HYZAAR) 100-25 MG per tablet Take 1 tablet by mouth  daily 90 tablet 3  . metFORMIN (GLUCOPHAGE) 500 MG tablet Take 2 tablets by mouth  twice a day with meals 360 tablet 3  . pantoprazole (PROTONIX) 40 MG tablet Take 1 tablet (40 mg total) by mouth daily. 90 tablet 3  . solifenacin (VESICARE) 5 MG tablet Take 1 tablet (5 mg total) by mouth daily. 90 tablet 3   No current facility-administered medications on file prior to visit.     Review of Systems  Constitutional: Negative for unusual diaphoresis or night sweats HENT: Negative for ringing in ear or discharge Eyes: Negative for double vision or worsening visual disturbance.  Respiratory: Negative for  choking and stridor.   Gastrointestinal: Negative for vomiting or other signifcant bowel change Genitourinary: Negative for hematuria or change in urine volume.  Musculoskeletal: Negative for other MSK pain or swelling Skin: Negative for color change and worsening wound.  Neurological: Negative for tremors and numbness other than noted  Psychiatric/Behavioral: Negative for decreased concentration or agitation other than above       Objective:   Physical Exam BP 136/70  mmHg  Pulse 63  Temp(Src) 98.2 F (36.8 C) (Oral)  Resp 18  Ht 6' 2" (1.88 m)  Wt 185 lb 9.6 oz (84.188 kg)  BMI 23.82 kg/m2  SpO2 97% VS noted,  Constitutional: Pt appears in no significant distress HENT: Head: NCAT.  Right Ear: External ear normal.  Left Ear: External ear normal.  Eyes: . Pupils are equal, round, and reactive to light. Conjunctivae and EOM are normal Bilat tm's with mild erythema.  Max sinus areas non tender.  Pharynx with mild erythema, no exudate Neck: Normal range of motion. Neck supple.  Cardiovascular: Normal rate and regular rhythm.   Pulmonary/Chest: Effort normal and breath sounds without rales or wheezing.  Neurological: Pt is alert. Not confused , motor grossly intact Skin: Skin is warm. No rash, no LE edema Psychiatric: Pt behavior is normal. No agitation.    Assessment & Plan:

## 2014-05-24 NOTE — Assessment & Plan Note (Signed)
Mild to mod, for depomedrol IM, allegra qd prn,,  to f/u any worsening symptoms or concerns

## 2014-05-24 NOTE — Patient Instructions (Signed)
Please take all new medication as prescribed - the antihistamine that is similar to [employer] had the steroid shot today  Please continue all other medications as before, and refills have been done if requested.  Please have the pharmacy call with any other refills you may need.  Please continue your efforts at being more active, low cholesterol diabetic diet, and weight control.  Please keep your appointments with your specialists as you may have planned

## 2014-05-25 ENCOUNTER — Telehealth: Payer: Self-pay | Admitting: Internal Medicine

## 2014-05-25 NOTE — Telephone Encounter (Signed)
emmi emailed

## 2014-06-19 ENCOUNTER — Ambulatory Visit (INDEPENDENT_AMBULATORY_CARE_PROVIDER_SITE_OTHER): Payer: Medicare Other | Admitting: Internal Medicine

## 2014-06-19 ENCOUNTER — Other Ambulatory Visit (INDEPENDENT_AMBULATORY_CARE_PROVIDER_SITE_OTHER): Payer: Medicare Other

## 2014-06-19 VITALS — BP 132/70 | HR 61 | Temp 98.6°F | Resp 18 | Ht 74.0 in | Wt 185.1 lb

## 2014-06-19 DIAGNOSIS — I10 Essential (primary) hypertension: Secondary | ICD-10-CM | POA: Diagnosis not present

## 2014-06-19 DIAGNOSIS — R350 Frequency of micturition: Secondary | ICD-10-CM

## 2014-06-19 DIAGNOSIS — N400 Enlarged prostate without lower urinary tract symptoms: Secondary | ICD-10-CM | POA: Diagnosis not present

## 2014-06-19 LAB — URINALYSIS, ROUTINE W REFLEX MICROSCOPIC
Bilirubin Urine: NEGATIVE
Hgb urine dipstick: NEGATIVE
Ketones, ur: NEGATIVE
Leukocytes, UA: NEGATIVE
Nitrite: NEGATIVE
RBC / HPF: NONE SEEN (ref 0–?)
Specific Gravity, Urine: 1.015 (ref 1.000–1.030)
Total Protein, Urine: NEGATIVE
Urine Glucose: NEGATIVE
Urobilinogen, UA: 0.2 (ref 0.0–1.0)
WBC, UA: NONE SEEN (ref 0–?)
pH: 6 (ref 5.0–8.0)

## 2014-06-19 LAB — POCT URINALYSIS DIPSTICK
Bilirubin, UA: NEGATIVE
Blood, UA: NEGATIVE
Glucose, UA: NEGATIVE
Ketones, UA: NEGATIVE
Leukocytes, UA: NEGATIVE
Nitrite, UA: NEGATIVE
Protein, UA: 15
Spec Grav, UA: 1.015
Urobilinogen, UA: 0.2
pH, UA: 6

## 2014-06-19 MED ORDER — TAMSULOSIN HCL 0.4 MG PO CAPS: 0.4000 mg | ORAL_CAPSULE | Freq: Every day | ORAL | Status: DC

## 2014-06-19 NOTE — Patient Instructions (Signed)
Please take all new medication as prescribed - the flomax  Please continue all other medications as before, and refills have been done if requested.  Please have the pharmacy call with any other refills you may need.  Please keep your appointments with your specialists as you may have planned  Your specimen will be sent for analysis and culture, and you will be notified if any other changes in treatment are needed

## 2014-06-19 NOTE — Assessment & Plan Note (Signed)
Ok for trial flomax qd,  to f/u any worsening symptoms or concerns

## 2014-06-19 NOTE — Assessment & Plan Note (Addendum)
With nocturia, etiology unclear, for urine cx - tx as indiecated pending results, also for UA with micro - r/o hematuria and need for urology f/u

## 2014-06-19 NOTE — Addendum Note (Signed)
Addended by: Valerie Salts on: 06/19/2014 04:39 PM   Modules accepted: Orders

## 2014-06-19 NOTE — Assessment & Plan Note (Signed)
stable overall by history and exam, recent data reviewed with pt, and pt to continue medical treatment as before,  to f/u any worsening symptoms or concerns BP Readings from Last 3 Encounters:  06/19/14 132/70  05/24/14 136/70  05/01/14 130/60

## 2014-06-19 NOTE — Progress Notes (Signed)
Subjective:    Patient ID: Nicholas Patton, male    DOB: [birth year], 80 y.o.   MRN: [id number]  HPI  Here to f/u, c/o freq urination, not sure about daytime but hsa nocutria 2-3 times per night worse recently, has been tx successfully at least to start with approx 1 yr ago, last saw urology 06/2013 and felt to be doing well, no plans to f/u.  Denies urinary symptoms such as dysuria, frequency,, flank pain, hematuria or n/v, fever, chills, but has had some lower abd mid pain as well.  Denies worsening reflux, other abd pain, dysphagia, n/v, bowel change or blood.  Pt denies polydipsia, polyuria, cbg;s in low 100's.  Pt denies chest pain, increased sob or doe, wheezing, orthopnea, PND, increased LE swelling, palpitations, dizziness or syncope. Does not take the hct before bed Past Medical History  Diagnosis Date  . ALLERGIC RHINITIS 01/24/2007  . BENIGN PROSTATIC HYPERTROPHY 01/24/2007  . BRONCHITIS, ACUTE 06/11/2008  . Cough 01/24/2007  . DIABETES MELLITUS, TYPE II 01/24/2007  . FATIGUE 07/27/2007  . HYPERLIPIDEMIA 01/24/2007  . HYPERTENSION 10/05/2006  . INSOMNIA 06/21/2008  . LEG PAIN, BILATERAL 10/06/2007  . PERIPHERAL NEUROPATHY, FEET 10/06/2007  . PERS HX TOBACCO USE PRESENTING HAZARDS HEALTH 09/17/2009  . VITAMIN B12 DEFICIENCY 09/06/2008  . VITAMIN D DEFICIENCY 01/16/2010  . Wheezing 01/16/2010  . Cholecystitis, acute 07/22/2012  . Ileus following gastrointestinal surgery 07/22/2012  . Postoperative fever 07/22/2012  . Erectile dysfunction 09/22/2012  . Peripheral neuropathy    Past Surgical History  Procedure Laterality Date  . Total knee arthroplasty  2001    Left  . Turp vaporization    . Cholecystectomy N/A 07/18/2012    Procedure: LAPAROSCOPIC CHOLECYSTECTOMY WITH INTRAOPERATIVE CHOLANGIOGRAM;  Surgeon: Zenovia Jarred, MD;  Location: [place];  Service: General;  Laterality: N/A;    reports that he quit smoking about 21 years ago. His smoking use included Cigarettes. He has a 35  pack-year smoking history. He has never used smokeless tobacco. He reports that he does not drink alcohol or use illicit drugs. family history includes Diabetes Mellitus II in his mother; Lung cancer in his brother. No Known Allergies Current Outpatient Prescriptions on File Prior to Visit  Medication Sig Dispense Refill  . amLODipine (NORVASC) 5 MG tablet Take 1 tablet (5 mg total) by mouth daily. 90 tablet 3  . aspirin 81 MG EC tablet Take 81 mg by mouth daily.      Marland Kitchen atorvastatin (LIPITOR) 40 MG tablet Take 1 tablet by mouth  daily 90 tablet 3  . Cholecalciferol (VITAMIN D3) 1000 UNITS CAPS Take 1 capsule (1,000 Units total) by mouth daily. 90 capsule 3  . fluticasone (FLONASE) 50 MCG/ACT nasal spray Place 2 sprays into both nostrils daily. 16 g 1  . glimepiride (AMARYL) 1 MG tablet Take one-half tablet by  mouth two times daily  before meals 90 tablet 3  . levocetirizine (XYZAL) 5 MG tablet Take 1 tablet (5 mg total) by mouth every evening. 30 tablet 11  . losartan-hydrochlorothiazide (HYZAAR) 100-25 MG per tablet Take 1 tablet by mouth  daily 90 tablet 3  . metFORMIN (GLUCOPHAGE) 500 MG tablet Take 2 tablets by mouth  twice a day with meals 360 tablet 3  . pantoprazole (PROTONIX) 40 MG tablet Take 1 tablet (40 mg total) by mouth daily. 90 tablet 3  . solifenacin (VESICARE) 5 MG tablet Take 1 tablet (5 mg total) by mouth daily. 90 tablet 3   No  current facility-administered medications on file prior to visit.   Review of Systems  Constitutional: Negative for unusual diaphoresis or night sweats HENT: Negative for ringing in ear or discharge Eyes: Negative for double vision or worsening visual disturbance.  Respiratory: Negative for choking and stridor.   Gastrointestinal: Negative for vomiting or other signifcant bowel change Genitourinary: Negative for hematuria or change in urine volume.  Musculoskeletal: Negative for other MSK pain or swelling Skin: Negative for color change and  worsening wound.  Neurological: Negative for tremors and numbness other than noted  Psychiatric/Behavioral: Negative for decreased concentration or agitation other than above       Objective:   Physical Exam BP 132/70 mmHg  Pulse 61  Temp(Src) 98.6 F (37 C) (Oral)  Resp 18  Ht _0  (1.88 m)  Wt 185 lb 1.9 oz (83.97 kg)  BMI 23.76 kg/m2  SpO2 94% VS noted,  Constitutional: Pt appears in no significant distress HENT: Head: NCAT.  Right Ear: External ear normal.  Left Ear: External ear normal.  Eyes: . Pupils are equal, round, and reactive to light. Conjunctivae and EOM are normal Neck: Normal range of motion. Neck supple.  Cardiovascular: Normal rate and regular rhythm.   Pulmonary/Chest: Effort normal and breath sounds without rales or wheezing.  Abd:  Soft, NT, ND, + BS- benign, no flank tender Neurological: Pt is alert. Not confused , motor grossly intact Skin: Skin is warm. No rash, no LE edema Psychiatric: Pt behavior is normal. No agitation.   UDIP - negative    Assessment & Plan:

## 2014-06-20 DIAGNOSIS — M1711 Unilateral primary osteoarthritis, right knee: Secondary | ICD-10-CM | POA: Diagnosis not present

## 2014-06-20 DIAGNOSIS — M25561 Pain in right knee: Secondary | ICD-10-CM | POA: Diagnosis not present

## 2014-06-20 LAB — URINE CULTURE
Colony Count: NO GROWTH
Organism ID, Bacteria: NO GROWTH

## 2014-07-30 ENCOUNTER — Encounter: Payer: Self-pay | Admitting: Podiatry

## 2014-07-30 ENCOUNTER — Ambulatory Visit (INDEPENDENT_AMBULATORY_CARE_PROVIDER_SITE_OTHER): Payer: Medicare Other | Admitting: Podiatry

## 2014-07-30 VITALS — BP 140/62 | HR 70 | Ht 74.0 in | Wt 185.0 lb

## 2014-07-30 DIAGNOSIS — B351 Tinea unguium: Secondary | ICD-10-CM

## 2014-07-30 DIAGNOSIS — M79606 Pain in leg, unspecified: Secondary | ICD-10-CM | POA: Diagnosis not present

## 2014-07-30 NOTE — Patient Instructions (Signed)
Seen for hypertrophic nails and painful corns in between first and 2nd toe left. All nails and corns debrided. Return in 3 months or as needed.

## 2014-07-30 NOTE — Progress Notes (Signed)
Subjective:  80 year old diabetic male presents requesting toe nails and calluses trimmed.  Left first and 2nd toe are painful from thick callus on contact side of toes.  Objective: Pedal pulses are all palpable.  Thick mycotic nails x 10. Mildly ingrown nails on both great toes. Plantar callus under the MPJ right.  No open lesions. No edema or erythema.  Inter digital keratoma 1st and 2nd digit at contact surface left foot.  Severe hallux valgus with bunion bilateral.  Severely contracted 2nd digit left.   Assessment: Diabetic neuropathy.  Hallux valgus with bunion bilateral.  Severe contracted hammer toe deformity 2nd left.  Mycotic nails x 10.   Plan: Debrided all corns and calluses and nails.  Patient will return in 3 months for palliation.

## 2014-08-13 ENCOUNTER — Emergency Department (HOSPITAL_COMMUNITY)
Admission: EM | Admit: 2014-08-13 | Discharge: 2014-08-13 | Disposition: A | Discharge status: Home / Self Care | Payer: Medicare Other | Attending: Emergency Medicine | Admitting: Emergency Medicine

## 2014-08-13 ENCOUNTER — Emergency Department (HOSPITAL_COMMUNITY): Payer: Medicare Other

## 2014-08-13 ENCOUNTER — Encounter (HOSPITAL_COMMUNITY): Payer: Self-pay

## 2014-08-13 DIAGNOSIS — Z79899 Other long term (current) drug therapy: Secondary | ICD-10-CM | POA: Insufficient documentation

## 2014-08-13 DIAGNOSIS — E119 Type 2 diabetes mellitus without complications: Secondary | ICD-10-CM | POA: Diagnosis not present

## 2014-08-13 DIAGNOSIS — Z87891 Personal history of nicotine dependence: Secondary | ICD-10-CM | POA: Insufficient documentation

## 2014-08-13 DIAGNOSIS — E785 Hyperlipidemia, unspecified: Secondary | ICD-10-CM | POA: Diagnosis not present

## 2014-08-13 DIAGNOSIS — R0602 Shortness of breath: Secondary | ICD-10-CM | POA: Diagnosis not present

## 2014-08-13 DIAGNOSIS — Z8719 Personal history of other diseases of the digestive system: Secondary | ICD-10-CM | POA: Insufficient documentation

## 2014-08-13 DIAGNOSIS — Z7982 Long term (current) use of aspirin: Secondary | ICD-10-CM | POA: Insufficient documentation

## 2014-08-13 DIAGNOSIS — N400 Enlarged prostate without lower urinary tract symptoms: Secondary | ICD-10-CM | POA: Diagnosis not present

## 2014-08-13 DIAGNOSIS — G5790 Unspecified mononeuropathy of unspecified lower limb: Secondary | ICD-10-CM | POA: Diagnosis not present

## 2014-08-13 LAB — BASIC METABOLIC PANEL
Anion gap: 11 (ref 5–15)
BUN: 22 mg/dL — ABNORMAL HIGH (ref 6–20)
CO2: 25 mmol/L (ref 22–32)
Calcium: 9.5 mg/dL (ref 8.9–10.3)
Chloride: 103 mmol/L (ref 101–111)
Creatinine, Ser: 1.47 mg/dL — ABNORMAL HIGH (ref 0.61–1.24)
GFR calc Af Amer: 51 mL/min — ABNORMAL LOW (ref >60)
GFR calc non Af Amer: 44 mL/min — ABNORMAL LOW (ref >60)
Glucose, Bld: 222 mg/dL — ABNORMAL HIGH (ref 65–99)
Potassium: 4.1 mmol/L (ref 3.5–5.1)
Sodium: 139 mmol/L (ref 135–145)

## 2014-08-13 LAB — BRAIN NATRIURETIC PEPTIDE: B Natriuretic Peptide: 14.2 pg/mL (ref 0.0–100.0)

## 2014-08-13 LAB — CBC
HCT: 36.6 % — ABNORMAL LOW (ref 39.0–52.0)
Hemoglobin: 11.3 g/dL — ABNORMAL LOW (ref 13.0–17.0)
MCH: 25.3 pg — ABNORMAL LOW (ref 26.0–34.0)
MCHC: 30.9 g/dL (ref 30.0–36.0)
MCV: 82.1 fL (ref 78.0–100.0)
Platelets: 239 10*3/uL (ref 150–400)
RBC: 4.46 MIL/uL (ref 4.22–5.81)
RDW: 15.7 % — ABNORMAL HIGH (ref 11.5–15.5)
WBC: 3.3 10*3/uL — ABNORMAL LOW (ref 4.0–10.5)

## 2014-08-13 LAB — I-STAT TROPONIN, ED: Troponin i, poc: 0 ng/mL (ref 0.00–0.08)

## 2014-08-13 NOTE — Discharge Instructions (Signed)
Chest x-ray normal today labs without significant abnormalities. Return for any new or worse symptoms to include worsening shortness of breath or reoccurrence of the shortness of breath. Make an appointment to follow-up with your regular doctor for further evaluation.

## 2014-08-13 NOTE — ED Notes (Addendum)
Pt started feeling SOB yesterday and went to sleep and then woke up this morning and it was gone. Denies any chest pain. Pt states he exercised this morning and felt okay.

## 2014-08-13 NOTE — ED Notes (Signed)
MD at bedside. 

## 2014-08-13 NOTE — ED Provider Notes (Signed)
CSN: [id number]     Arrival date & time 08/13/14  1347 History   First MD Initiated Contact with Patient 08/13/14 1902     Chief Complaint  Patient presents with  . Shortness of Breath     (Consider location/radiation/quality/duration/timing/severity/associated sxs/prior Treatment) Patient is a 80 y.o. male presenting with shortness of breath. The history is provided by the patient.  Shortness of Breath Associated symptoms: no abdominal pain, no chest pain, no cough, no fever, no headaches, no rash and no vomiting    patient with 2 episodes of shortness of breath 1 last evening and one today around noon. No shortness of breath since being in the waiting room which was awake for about 5 hours. Both episodes were somewhat episodic in brief period unassociated with any chest pain. Patient denies fevers denies any cold symptoms.  Past Medical History  Diagnosis Date  . ALLERGIC RHINITIS 01/24/2007  . BENIGN PROSTATIC HYPERTROPHY 01/24/2007  . BRONCHITIS, ACUTE 06/11/2008  . Cough 01/24/2007  . DIABETES MELLITUS, TYPE II 01/24/2007  . FATIGUE 07/27/2007  . HYPERLIPIDEMIA 01/24/2007  . HYPERTENSION 10/05/2006  . INSOMNIA 06/21/2008  . LEG PAIN, BILATERAL 10/06/2007  . PERIPHERAL NEUROPATHY, FEET 10/06/2007  . PERS HX TOBACCO USE PRESENTING HAZARDS HEALTH 09/17/2009  . VITAMIN B12 DEFICIENCY 09/06/2008  . VITAMIN D DEFICIENCY 01/16/2010  . Wheezing 01/16/2010  . Cholecystitis, acute 07/22/2012  . Ileus following gastrointestinal surgery 07/22/2012  . Postoperative fever 07/22/2012  . Erectile dysfunction 09/22/2012  . Peripheral neuropathy    Past Surgical History  Procedure Laterality Date  . Total knee arthroplasty  2001    Left  . Turp vaporization    . Cholecystectomy N/A 07/18/2012    Procedure: LAPAROSCOPIC CHOLECYSTECTOMY WITH INTRAOPERATIVE CHOLANGIOGRAM;  Surgeon: Zenovia Jarred, MD;  Location: [place];  Service: General;  Laterality: N/A;   Family History  Problem Relation Age of  Onset  . Lung cancer Brother   . Diabetes Mellitus II Mother    History  Substance Use Topics  . Smoking status: Former Smoker -- 1.00 packs/day for 35 years    Types: Cigarettes    Quit date: 05/06/1993  . Smokeless tobacco: Never Used  . Alcohol Use: No    Review of Systems  Constitutional: Negative for fever.  HENT: Negative for congestion.   Eyes: Negative for redness.  Respiratory: Positive for shortness of breath. Negative for cough.   Cardiovascular: Negative for chest pain.  Gastrointestinal: Negative for nausea, vomiting and abdominal pain.  Genitourinary: Negative for dysuria.  Musculoskeletal: Negative for back pain.  Skin: Negative for rash.  Neurological: Negative for headaches.  Hematological: Does not bruise/bleed easily.  Psychiatric/Behavioral: Negative for confusion.      Allergies  Review of patient's allergies indicates no known allergies.  Home Medications   Prior to Admission medications   Medication Sig Start Date End Date Taking? Authorizing Provider  amLODipine (NORVASC) 5 MG tablet Take 1 tablet (5 mg total) by mouth daily. 04/24/14 04/24/15 Yes [name], MD  aspirin 81 MG EC tablet Take 81 mg by mouth daily.     Yes Historical Provider, MD  atorvastatin (LIPITOR) 40 MG tablet Take 1 tablet by mouth  daily 04/24/14  Yes [name], MD  Cholecalciferol (VITAMIN D3) 1000 UNITS CAPS Take 1 capsule (1,000 Units total) by mouth daily. 03/22/13  Yes [name], MD  glimepiride (AMARYL) 1 MG tablet Take one-half tablet by  mouth two times daily  before meals 04/24/14  Yes [name], MD  metFORMIN (GLUCOPHAGE) 500 MG tablet Take 2 tablets by mouth  twice a day with meals 04/24/14  Yes [name], MD  Multiple Vitamins-Minerals (MULTIVITAMIN PO) Take 1 tablet by mouth daily.   Yes Historical Provider, MD  pantoprazole (PROTONIX) 40 MG tablet Take 1 tablet (40 mg total) by mouth daily. 04/24/14  Yes [name], MD  tamsulosin (FLOMAX) 0.4 MG CAPS  capsule Take 1 capsule (0.4 mg total) by mouth daily. 06/19/14  Yes [name], MD  fluticasone (FLONASE) 50 MCG/ACT nasal spray Place 2 sprays into both nostrils daily. Patient not taking: Reported on 08/13/2014 07/07/13   Lucretia Kern, DO  levocetirizine (XYZAL) 5 MG tablet Take 1 tablet (5 mg total) by mouth every evening. Patient not taking: Reported on 08/13/2014 05/24/14   [name], MD  losartan-hydrochlorothiazide [facility]) 100-25 MG per tablet Take 1 tablet by mouth  daily Patient not taking: Reported on 08/13/2014 04/24/14   [name], MD  solifenacin (VESICARE) 5 MG tablet Take 1 tablet (5 mg total) by mouth daily. Patient not taking: Reported on 08/13/2014 04/18/14   [name], MD   BP 138/59 mmHg  Pulse 51  Temp(Src) 97.6 F (36.4 C) (Oral)  Resp 14  Ht _0  (1.88 m)  Wt 185 lb (83.915 kg)  BMI 23.74 kg/m2  SpO2 100% Physical Exam  Constitutional: He is oriented to person, place, and time. He appears well-developed and well-nourished. No distress.  HENT:  Head: Normocephalic and atraumatic.  Mouth/Throat: Oropharynx is clear and moist.  Eyes: Conjunctivae and EOM are normal. Pupils are equal, round, and reactive to light.  Neck: Normal range of motion.  Cardiovascular: Normal rate, regular rhythm and normal heart sounds.   No murmur heard. Pulmonary/Chest: Effort normal and breath sounds normal. No respiratory distress.  Abdominal: Soft. Bowel sounds are normal. There is no tenderness.  Musculoskeletal: Normal range of motion. He exhibits no edema.  Neurological: He is alert and oriented to person, place, and time. No cranial nerve deficit. He exhibits normal muscle tone. Coordination normal.  Skin: Skin is warm. No rash noted.  Nursing note and vitals reviewed.   ED Course  Procedures (including critical care time) Labs Review Labs Reviewed  CBC - Abnormal; Notable for the following:    WBC 3.3 (*)    Hemoglobin 11.3 (*)    HCT 36.6 (*)    MCH 25.3 (*)    RDW  15.7 (*)    All other components within normal limits  BASIC METABOLIC PANEL - Abnormal; Notable for the following:    Glucose, Bld 222 (*)    BUN 22 (*)    Creatinine, Ser 1.47 (*)    GFR calc non Af Amer 44 (*)    GFR calc Af Amer 51 (*)    All other components within normal limits  BRAIN NATRIURETIC PEPTIDE  I-STAT TROPOININ, ED   Results for orders placed or performed during the hospital encounter of 08/13/14  CBC  Result Value Ref Range   WBC 3.3 (L) 4.0 - 10.5 K/uL   RBC 4.46 4.22 - 5.81 MIL/uL   Hemoglobin 11.3 (L) 13.0 - 17.0 g/dL   HCT 36.6 (L) 39.0 - 52.0 %   MCV 82.1 78.0 - 100.0 fL   MCH 25.3 (L) 26.0 - 34.0 pg   MCHC 30.9 30.0 - 36.0 g/dL   RDW 15.7 (H) 11.5 - 15.5 %   Platelets 239 150 -  400 K/uL  Basic metabolic panel  Result Value Ref Range   Sodium 139 135 - 145 mmol/L   Potassium 4.1 3.5 - 5.1 mmol/L   Chloride 103 101 - 111 mmol/L   CO2 25 22 - 32 mmol/L   Glucose, Bld 222 (H) 65 - 99 mg/dL   BUN 22 (H) 6 - 20 mg/dL   Creatinine, Ser 1.47 (H) 0.61 - 1.24 mg/dL   Calcium 9.5 8.9 - 10.3 mg/dL   GFR calc non Af Amer 44 (L) >60 mL/min   GFR calc Af Amer 51 (L) >60 mL/min   Anion gap 11 5 - 15  BNP (order ONLY if patient complains of dyspnea/SOB AND you have documented it for THIS visit)  Result Value Ref Range   B Natriuretic Peptide 14.2 0.0 - 100.0 pg/mL  I-stat troponin, ED  (not at [facility], [facility])  Result Value Ref Range   Troponin i, poc 0.00 0.00 - 0.08 ng/mL   Comment 3             Imaging Review Dg Chest 2 View  08/13/2014   CLINICAL DATA:  Shortness of breath.  EXAM: CHEST  2 VIEW  COMPARISON:  05/06/2013  FINDINGS: Normal heart size. There is no pleural effusion or edema. There is no airspace consolidation identified. Interstitial coarsening is identified bilaterally. Basilar predominant interstitial reticulation is noted.  IMPRESSION: 1. No acute cardiopulmonary abnormalities.   Electronically Signed   By: Kerby Moors M.D.   On: 08/13/2014 15:06      EKG Interpretation   Date/Time:  Monday August 13 2014 14:14:29 EDT Ventricular Rate:  104 PR Interval:  146 QRS Duration: 106 QT Interval:  358 QTC Calculation: 470 R Axis:   -71 Text Interpretation:  Sinus tachycardia with occasional Premature  ventricular complexes Left anterior fascicular block Minimal voltage  criteria for LVH, may be normal variant Abnormal ECG No previous ECGs  available Confirmed by Darrik Richman  MD, Haron Beilke ([id number]) on 08/13/2014 7:29:52 PM      MDM   Final diagnoses:  SOB (shortness of breath)   Patient with 2 episodes of shortness of breath. One was last evening. One was today around noon. Patient no longer has any symptoms. Both episodes were fairly short-lived. Patient had no chest pain. Lab workup chest x-ray all negative oxygen saturations on room air 99-100%.  Patient will be discharged home with precautions to return for any recurrent shortness of breath and to follow-up with his record Dr.    Fredia Sorrow, MD 08/13/14 2006

## 2014-08-14 ENCOUNTER — Encounter: Payer: Self-pay | Admitting: Internal Medicine

## 2014-08-14 ENCOUNTER — Ambulatory Visit (INDEPENDENT_AMBULATORY_CARE_PROVIDER_SITE_OTHER): Payer: Medicare Other | Admitting: Internal Medicine

## 2014-08-14 VITALS — BP 126/60 | HR 71 | Temp 98.1°F | Wt 187.0 lb

## 2014-08-14 DIAGNOSIS — R0600 Dyspnea, unspecified: Secondary | ICD-10-CM | POA: Diagnosis not present

## 2014-08-14 DIAGNOSIS — I10 Essential (primary) hypertension: Secondary | ICD-10-CM

## 2014-08-14 DIAGNOSIS — R079 Chest pain, unspecified: Secondary | ICD-10-CM

## 2014-08-14 NOTE — Assessment & Plan Note (Signed)
Recurrent, ? Reflux, not overtly assoc with dypsnea yesterday, but with age and CRF's will ask for stress test,  to f/u any worsening symptoms or concerns

## 2014-08-14 NOTE — Progress Notes (Signed)
Subjective:    Patient ID: Nicholas Patton, male    DOB: Jun 18, 1934, 80 y.o.   MRN: [id number]  HPI  Here to f?u, seen at ED June 6 withg 2 episodes short lived SOB, exam and routine labs benign, pt advised to f/u here. Wondering if flomax could be a cuase, or vesicare.  Also for right cataract surgury soon. Hands were shaky a bit yesterday, but Denies worsening depressive symptoms, suicidal ideation, or panic.  Has had some yellowish prod cough on occasion, but no worse than usual.  SOB not exertional yesterday, though did have some sob with climbing a flight of stairs last wk.  Has had occas worsening reflux symptoms, but o/w no abd pain, dysphagia, n/v, bowel change or blood.  TUMS helps when reflux wakes him up at night.  June 6 ECG WNL - SR.  Pt denies other chest pain, wheezing, orthopnea, PND, increased LE swelling, palpitations, dizziness or syncope.  Does c/o ongoing fatigue, but denies signficant daytime hypersomnolence.  Quit smoking x 30 yrs. Past Medical History  Diagnosis Date  . ALLERGIC RHINITIS 01/24/2007  . BENIGN PROSTATIC HYPERTROPHY 01/24/2007  . BRONCHITIS, ACUTE 06/11/2008  . Cough 01/24/2007  . DIABETES MELLITUS, TYPE II 01/24/2007  . FATIGUE 07/27/2007  . HYPERLIPIDEMIA 01/24/2007  . HYPERTENSION 10/05/2006  . INSOMNIA 06/21/2008  . LEG PAIN, BILATERAL 10/06/2007  . PERIPHERAL NEUROPATHY, FEET 10/06/2007  . PERS HX TOBACCO USE PRESENTING HAZARDS HEALTH 09/17/2009  . VITAMIN B12 DEFICIENCY 09/06/2008  . VITAMIN D DEFICIENCY 01/16/2010  . Wheezing 01/16/2010  . Cholecystitis, acute 07/22/2012  . Ileus following gastrointestinal surgery 07/22/2012  . Postoperative fever 07/22/2012  . Erectile dysfunction 09/22/2012  . Peripheral neuropathy    Past Surgical History  Procedure Laterality Date  . Total knee arthroplasty  2001    Left  . Turp vaporization    . Cholecystectomy N/A 07/18/2012    Procedure: LAPAROSCOPIC CHOLECYSTECTOMY WITH INTRAOPERATIVE CHOLANGIOGRAM;  Surgeon:  Zenovia Jarred, MD;  Location: [facility];  Service: General;  Laterality: N/A;    reports that he quit smoking about 21 years ago. His smoking use included Cigarettes. He has a 35 pack-year smoking history. He has never used smokeless tobacco. He reports that he does not drink alcohol or use illicit drugs. family history includes Diabetes Mellitus II in his mother; Lung cancer in his brother. No Known Allergies Current Outpatient Prescriptions on File Prior to Visit  Medication Sig Dispense Refill  . amLODipine (NORVASC) 5 MG tablet Take 1 tablet (5 mg total) by mouth daily. 90 tablet 3  . aspirin 81 MG EC tablet Take 81 mg by mouth daily.      Marland Kitchen atorvastatin (LIPITOR) 40 MG tablet Take 1 tablet by mouth  daily 90 tablet 3  . Cholecalciferol (VITAMIN D3) 1000 UNITS CAPS Take 1 capsule (1,000 Units total) by mouth daily. 90 capsule 3  . glimepiride (AMARYL) 1 MG tablet Take one-half tablet by  mouth two times daily  before meals 90 tablet 3  . losartan-hydrochlorothiazide (HYZAAR) 100-25 MG per tablet Take 1 tablet by mouth  daily 90 tablet 3  . metFORMIN (GLUCOPHAGE) 500 MG tablet Take 2 tablets by mouth  twice a day with meals 360 tablet 3  . Multiple Vitamins-Minerals (MULTIVITAMIN PO) Take 1 tablet by mouth daily.    . pantoprazole (PROTONIX) 40 MG tablet Take 1 tablet (40 mg total) by mouth daily. 90 tablet 3  . fluticasone (FLONASE) 50 MCG/ACT nasal spray Place 2 sprays  into both nostrils daily. (Patient not taking: Reported on 08/13/2014) 16 g 1  . levocetirizine (XYZAL) 5 MG tablet Take 1 tablet (5 mg total) by mouth every evening. (Patient not taking: Reported on 08/13/2014) 30 tablet 11  . solifenacin (VESICARE) 5 MG tablet Take 1 tablet (5 mg total) by mouth daily. (Patient not taking: Reported on 08/13/2014) 90 tablet 3  . tamsulosin (FLOMAX) 0.4 MG CAPS capsule Take 1 capsule (0.4 mg total) by mouth daily. (Patient not taking: Reported on 08/14/2014) 30 capsule 3   No current  facility-administered medications on file prior to visit.    Review of Systems  Constitutional: Negative for unusual diaphoresis or night sweats HENT: Negative for ringing in ear or discharge Eyes: Negative for double vision or worsening visual disturbance.  Respiratory: Negative for choking and stridor.   Gastrointestinal: Negative for vomiting or other signifcant bowel change Genitourinary: Negative for hematuria or change in urine volume.  Musculoskeletal: Negative for other MSK pain or swelling Skin: Negative for color change and worsening wound.  Neurological: Negative for tremors and numbness other than noted  Psychiatric/Behavioral: Negative for decreased concentration or agitation other than above       Objective:   Physical Exam BP 126/60 mmHg  Pulse 71  Temp(Src) 98.1 F (36.7 C) (Oral)  Wt 187 lb (84.823 kg)  SpO2 97% VS noted,  Constitutional: Pt appears in no significant distress HENT: Head: NCAT.  Right Ear: External ear normal.  Left Ear: External ear normal.  Eyes: . Pupils are equal, round, and reactive to light. Conjunctivae and EOM are normal Neck: Normal range of motion. Neck supple.  Cardiovascular: Normal rate and regular rhythm.   Pulmonary/Chest: Effort normal and breath sounds without rales or wheezing.  Abd:  Soft, NT, ND, + BS Neurological: Pt is alert. Not confused , motor grossly intact Skin: Skin is warm. No rash, no LE edema Psychiatric: Pt behavior is normal. No agitation.     Assessment & Plan:

## 2014-08-14 NOTE — Patient Instructions (Signed)
/  Please continue all other medications as before, and refills have been done if requested.  Please have the pharmacy call with any other refills you may need.  Please continue your efforts at being more active, low cholesterol diet, and weight control.  Please keep your appointments with your specialists as you may have planned  You will be contacted regarding the referral for: stress test, and echocardiogram  You will be contacted by phone if any changes need to be made immediately.  Otherwise, you will receive a letter about your results with an explanation, but please check with MyChart first.  Please remember to sign up for MyChart if you have not done so, as this will be important to you in the future with finding out test results, communicating by private email, and scheduling acute appointments online when needed.

## 2014-08-14 NOTE — Assessment & Plan Note (Signed)
stable overall by history and exam, recent data reviewed with pt, and pt to continue medical treatment as before,  to f/u any worsening symptoms or concerns BP Readings from Last 3 Encounters:  08/14/14 126/60  08/13/14 135/58  07/30/14 140/62

## 2014-08-14 NOTE — Progress Notes (Signed)
Pre visit review using our clinic review tool, if applicable. No additional management support is needed unless otherwise documented below in the visit note.

## 2014-08-14 NOTE — Assessment & Plan Note (Signed)
stable overall by history and exam, recent data reviewed with pt, and pt to continue medical treatment as before,  to f/u any worsening symptoms or concerns Lab Results  Component Value Date   WBC 3.3* 08/13/2014   HGB 11.3* 08/13/2014   HCT 36.6* 08/13/2014   PLT 239 08/13/2014   GLUCOSE 222* 08/13/2014   CHOL 119 04/06/2014   TRIG 81.0 04/06/2014   HDL 55.40 04/06/2014   LDLCALC 47 04/06/2014   ALT 9 04/06/2014   AST 18 04/06/2014   NA 139 08/13/2014   K 4.1 08/13/2014   CL 103 08/13/2014   CREATININE 1.47* 08/13/2014   BUN 22* 08/13/2014   CO2 25 08/13/2014   TSH 2.92 09/28/2013   PSA 2.31 09/28/2013   HGBA1C 6.9* 04/06/2014   MICROALBUR 0.2 09/28/2013   For echo as well, consider PFT's

## 2014-08-20 DIAGNOSIS — H2511 Age-related nuclear cataract, right eye: Secondary | ICD-10-CM | POA: Diagnosis not present

## 2014-08-27 DIAGNOSIS — H2511 Age-related nuclear cataract, right eye: Secondary | ICD-10-CM | POA: Diagnosis not present

## 2014-08-27 DIAGNOSIS — H25811 Combined forms of age-related cataract, right eye: Secondary | ICD-10-CM | POA: Diagnosis not present

## 2014-09-04 ENCOUNTER — Telehealth (HOSPITAL_COMMUNITY): Payer: Self-pay | Admitting: *Deleted

## 2014-09-04 NOTE — Telephone Encounter (Signed)
Left message on voicemail per DPR in reference to upcoming appointment scheduled on 09/06/14 at 0730 with detailed instructions given per Myocardial Perfusion Study Information Sheet for the test. LM to arrive 15 minutes early, and that it is imperative to arrive on time for appointment to keep from having the test rescheduled.Phone number given for call back for any questions. Zachrey Deutscher, Ranae Palms

## 2014-09-06 ENCOUNTER — Ambulatory Visit (HOSPITAL_BASED_OUTPATIENT_CLINIC_OR_DEPARTMENT_OTHER): Payer: Medicare Other

## 2014-09-06 ENCOUNTER — Ambulatory Visit (HOSPITAL_COMMUNITY): Payer: Medicare Other | Attending: Cardiology

## 2014-09-06 ENCOUNTER — Other Ambulatory Visit: Payer: Self-pay

## 2014-09-06 DIAGNOSIS — R0600 Dyspnea, unspecified: Secondary | ICD-10-CM

## 2014-09-06 DIAGNOSIS — R079 Chest pain, unspecified: Secondary | ICD-10-CM

## 2014-09-06 DIAGNOSIS — R9439 Abnormal result of other cardiovascular function study: Secondary | ICD-10-CM | POA: Diagnosis not present

## 2014-09-06 DIAGNOSIS — I340 Nonrheumatic mitral (valve) insufficiency: Secondary | ICD-10-CM | POA: Diagnosis not present

## 2014-09-06 DIAGNOSIS — I071 Rheumatic tricuspid insufficiency: Secondary | ICD-10-CM | POA: Diagnosis not present

## 2014-09-06 DIAGNOSIS — I358 Other nonrheumatic aortic valve disorders: Secondary | ICD-10-CM | POA: Insufficient documentation

## 2014-09-06 LAB — MYOCARDIAL PERFUSION IMAGING
LV dias vol: 139 mL
LV sys vol: 64 mL
Peak HR: 82 {beats}/min
RATE: 0.26
Rest HR: 49 {beats}/min
SDS: 3
SRS: 7
SSS: 8
TID: 1.14

## 2014-09-06 MED ORDER — REGADENOSON 0.4 MG/5ML IV SOLN
0.4000 mg | Freq: Once | INTRAVENOUS | Status: AC
  Administered 2014-09-06: 0.4 mg via INTRAVENOUS

## 2014-09-06 MED ORDER — TECHNETIUM TC 99M SESTAMIBI GENERIC - CARDIOLITE
10.8000 | Freq: Once | INTRAVENOUS | Status: AC | PRN
  Administered 2014-09-06: 11 via INTRAVENOUS

## 2014-09-06 MED ORDER — TECHNETIUM TC 99M SESTAMIBI GENERIC - CARDIOLITE
29.4000 | Freq: Once | INTRAVENOUS | Status: AC | PRN
  Administered 2014-09-06: 29 via INTRAVENOUS

## 2014-09-07 DIAGNOSIS — H43821 Vitreomacular adhesion, right eye: Secondary | ICD-10-CM | POA: Diagnosis not present

## 2014-09-07 DIAGNOSIS — Z961 Presence of intraocular lens: Secondary | ICD-10-CM | POA: Diagnosis not present

## 2014-09-24 ENCOUNTER — Encounter: Payer: Self-pay | Admitting: Gastroenterology

## 2014-09-26 ENCOUNTER — Emergency Department (INDEPENDENT_AMBULATORY_CARE_PROVIDER_SITE_OTHER)
Admission: EM | Admit: 2014-09-26 | Discharge: 2014-09-26 | Disposition: A | Discharge status: Home / Self Care | Payer: Medicare Other | Source: Home / Self Care

## 2014-09-26 ENCOUNTER — Encounter (HOSPITAL_COMMUNITY): Payer: Self-pay | Admitting: *Deleted

## 2014-09-26 DIAGNOSIS — K1121 Acute sialoadenitis: Secondary | ICD-10-CM

## 2014-09-26 MED ORDER — PENICILLIN V POTASSIUM 500 MG PO TABS: 500.0000 mg | ORAL_TABLET | Freq: Three times a day (TID) | ORAL | Status: AC

## 2014-09-26 MED ORDER — PENICILLIN V POTASSIUM 500 MG PO TABS: 500.0000 mg | ORAL_TABLET | Freq: Three times a day (TID) | ORAL | Status: DC

## 2014-09-26 NOTE — ED Notes (Signed)
Swelling  To  r  Side  Face  Under  The  Jaw  On  The  r  Side   - denys  Any  specefic  Injury -  Sitting  Upright on  The  Exam table  Speaking in  Complete  sentances  And  Is  In no  Severe  Distress

## 2014-09-26 NOTE — Discharge Instructions (Signed)
Parotitis  Parotitis means one or both of your parotid glands are sore and puffy (inflamed). The parotid glands make spit (saliva) in the mouth. HOME CARE  If you were given antibiotic medicines, take them as told. Finish them even if you start to feel better.  Put warm cloths (compresses) on the sore area.  Only take medicines as told by your doctor.  Drink enough fluids to keep your pee (urine) clear or pale yellow. GET HELP RIGHT AWAY IF:  You have more pain or puffiness (swelling) that is not helped by medicine.  You have a fever. MAKE SURE YOU:  Understand these instructions.  Will watch your condition.  Will get help right away if you are not doing well or get worse. Document Released: 03/28/2010 Document Revised: 05/18/2011 Document Reviewed: 01/19/2011 [facility] Patient Information 2015 [place], [place]. This information is not intended to replace advice given to you by your health care provider. Make sure you discuss any questions you have with your health care provider.

## 2014-09-26 NOTE — ED Provider Notes (Signed)
CSN: [id number]     Arrival date & time 09/26/14  1415 History   First MD Initiated Contact with Patient 09/26/14 1518     No chief complaint on file.  (Consider location/radiation/quality/duration/timing/severity/associated sxs/prior Treatment) The history is provided by the patient.   this is a 80 year old gentleman who noticed that his right cheek was pinned a swell after he started lunch today at 1:00. He's had no pain, no trauma, no insect bite. He also denies dental problems. He's had no fever.  Past Medical History  Diagnosis Date  . ALLERGIC RHINITIS 01/24/2007  . BENIGN PROSTATIC HYPERTROPHY 01/24/2007  . BRONCHITIS, ACUTE 06/11/2008  . Cough 01/24/2007  . DIABETES MELLITUS, TYPE II 01/24/2007  . FATIGUE 07/27/2007  . HYPERLIPIDEMIA 01/24/2007  . HYPERTENSION 10/05/2006  . INSOMNIA 06/21/2008  . LEG PAIN, BILATERAL 10/06/2007  . PERIPHERAL NEUROPATHY, FEET 10/06/2007  . PERS HX TOBACCO USE PRESENTING HAZARDS HEALTH 09/17/2009  . VITAMIN B12 DEFICIENCY 09/06/2008  . VITAMIN D DEFICIENCY 01/16/2010  . Wheezing 01/16/2010  . Cholecystitis, acute 07/22/2012  . Ileus following gastrointestinal surgery 07/22/2012  . Postoperative fever 07/22/2012  . Erectile dysfunction 09/22/2012  . Peripheral neuropathy    Past Surgical History  Procedure Laterality Date  . Total knee arthroplasty  2001    Left  . Turp vaporization    . Cholecystectomy N/A 07/18/2012    Procedure: LAPAROSCOPIC CHOLECYSTECTOMY WITH INTRAOPERATIVE CHOLANGIOGRAM;  Surgeon: Zenovia Jarred, MD;  Location: [facility];  Service: General;  Laterality: N/A;   Family History  Problem Relation Age of Onset  . Lung cancer Brother   . Diabetes Mellitus II Mother    History  Substance Use Topics  . Smoking status: Former Smoker -- 1.00 packs/day for 35 years    Types: Cigarettes    Quit date: 05/06/1993  . Smokeless tobacco: Never Used  . Alcohol Use: No    Review of Systems  Constitutional: Negative.   HENT: Positive  for facial swelling. Negative for congestion, dental problem, drooling, ear discharge, ear pain, hearing loss, mouth sores, nosebleeds, postnasal drip, rhinorrhea, sinus pressure, sneezing, sore throat, tinnitus, trouble swallowing and voice change.   Eyes: Negative.   Respiratory: Negative.   Cardiovascular: Negative.   Musculoskeletal: Negative.   Skin: Negative.   Neurological: Negative.     Allergies  Review of patient's allergies indicates no known allergies.  Home Medications   Prior to Admission medications   Medication Sig Start Date End Date Taking? Authorizing Provider  amLODipine (NORVASC) 5 MG tablet Take 1 tablet (5 mg total) by mouth daily. 04/24/14 04/24/15  [name], MD  aspirin 81 MG EC tablet Take 81 mg by mouth daily.      Historical Provider, MD  atorvastatin (LIPITOR) 40 MG tablet Take 1 tablet by mouth  daily 04/24/14   [name], MD  Cholecalciferol (VITAMIN D3) 1000 UNITS CAPS Take 1 capsule (1,000 Units total) by mouth daily. 03/22/13   [name], MD  fluticasone (FLONASE) 50 MCG/ACT nasal spray Place 2 sprays into both nostrils daily. Patient not taking: Reported on 08/13/2014 07/07/13   Lucretia Kern, DO  glimepiride (AMARYL) 1 MG tablet Take one-half tablet by  mouth two times daily  before meals 04/24/14   [name], MD  levocetirizine (XYZAL) 5 MG tablet Take 1 tablet (5 mg total) by mouth every evening. Patient not taking: Reported on 08/13/2014 05/24/14   [name], MD  losartan-hydrochlorothiazide [facility]) 100-25 MG per tablet Take  1 tablet by mouth  daily 04/24/14   [name], MD  metFORMIN (GLUCOPHAGE) 500 MG tablet Take 2 tablets by mouth  twice a day with meals 04/24/14   [name], MD  Multiple Vitamins-Minerals (MULTIVITAMIN PO) Take 1 tablet by mouth daily.    Historical Provider, MD  pantoprazole (PROTONIX) 40 MG tablet Take 1 tablet (40 mg total) by mouth daily. 04/24/14   [name], MD  solifenacin (VESICARE) 5 MG tablet Take 1 tablet  (5 mg total) by mouth daily. Patient not taking: Reported on 08/13/2014 04/18/14   [name], MD  tamsulosin [facility]) 0.4 MG CAPS capsule Take 1 capsule (0.4 mg total) by mouth daily. Patient not taking: Reported on 08/14/2014 06/19/14   [name], MD   BP 139/73 mmHg  Pulse 62  Temp(Src) 98.4 F (36.9 C) (Oral)  Resp 12  SpO2 96% Physical Exam  Constitutional: He appears well-developed and well-nourished.  HENT:  Head: Atraumatic.  Right Ear: External ear normal.  Left Ear: External ear normal.  Nose: Nose normal.  Mouth/Throat: Oropharynx is clear and moist.  Edentulous with upper and lower plates  Eyes: Conjunctivae and EOM are normal. Pupils are equal, round, and reactive to light.  Neck: Normal range of motion. Neck supple. No tracheal deviation present. No thyromegaly present.  Cardiovascular: Normal rate.   Pulmonary/Chest: Effort normal.  Lymphadenopathy:    He has no cervical adenopathy.  Skin: Skin is warm and dry.  The right jaw is mildly swollen and somewhat indurated over the angle of the jaw in the distribution of the parotid gland.  Nursing note and vitals reviewed.   ED Course  Procedures (including critical care time) Labs Review Labs Reviewed - No data to display  Imaging Review No results found.   MDM   This chart was scribed in my presence and reviewed by me personally.    ICD-9-CM ICD-10-CM   1. Acute parotitis 527.2 K11.21 penicillin v potassium (VEETID) 500 MG tablet     Signed, Robyn Haber, MD    Robyn Haber, MD 09/26/14 [phone]

## 2014-10-05 ENCOUNTER — Encounter: Payer: Self-pay | Admitting: Internal Medicine

## 2014-10-05 ENCOUNTER — Ambulatory Visit (INDEPENDENT_AMBULATORY_CARE_PROVIDER_SITE_OTHER): Payer: Medicare Other | Admitting: Internal Medicine

## 2014-10-05 ENCOUNTER — Other Ambulatory Visit (INDEPENDENT_AMBULATORY_CARE_PROVIDER_SITE_OTHER): Payer: Medicare Other

## 2014-10-05 VITALS — BP 114/60 | HR 66 | Temp 98.4°F | Ht 74.0 in | Wt 184.0 lb

## 2014-10-05 DIAGNOSIS — E785 Hyperlipidemia, unspecified: Secondary | ICD-10-CM

## 2014-10-05 DIAGNOSIS — I10 Essential (primary) hypertension: Secondary | ICD-10-CM

## 2014-10-05 DIAGNOSIS — Z23 Encounter for immunization: Secondary | ICD-10-CM | POA: Diagnosis not present

## 2014-10-05 DIAGNOSIS — E1140 Type 2 diabetes mellitus with diabetic neuropathy, unspecified: Secondary | ICD-10-CM

## 2014-10-05 DIAGNOSIS — N320 Bladder-neck obstruction: Secondary | ICD-10-CM

## 2014-10-05 DIAGNOSIS — Z86010 Personal history of colonic polyps: Secondary | ICD-10-CM

## 2014-10-05 LAB — HEPATIC FUNCTION PANEL
ALT: 12 U/L (ref 0–53)
AST: 19 U/L (ref 0–37)
Albumin: 4.6 g/dL (ref 3.5–5.2)
Alkaline Phosphatase: 102 U/L (ref 39–117)
Bilirubin, Direct: 0.1 mg/dL (ref 0.0–0.3)
Total Bilirubin: 0.5 mg/dL (ref 0.2–1.2)
Total Protein: 7.3 g/dL (ref 6.0–8.3)

## 2014-10-05 LAB — LIPID PANEL
Cholesterol: 134 mg/dL (ref 0–200)
HDL: 63.2 mg/dL (ref >39.00)
LDL Cholesterol: 57 mg/dL (ref 0–99)
NonHDL: 70.3
Total CHOL/HDL Ratio: 2
Triglycerides: 67 mg/dL (ref 0.0–149.0)
VLDL: 13.4 mg/dL (ref 0.0–40.0)

## 2014-10-05 LAB — CBC WITH DIFFERENTIAL/PLATELET
Basophils Absolute: 0 10*3/uL (ref 0.0–0.1)
Basophils Relative: 0.5 % (ref 0.0–3.0)
Eosinophils Absolute: 0.1 10*3/uL (ref 0.0–0.7)
Eosinophils Relative: 2.1 % (ref 0.0–5.0)
HCT: 37.6 % — ABNORMAL LOW (ref 39.0–52.0)
Hemoglobin: 12.1 g/dL — ABNORMAL LOW (ref 13.0–17.0)
Lymphocytes Relative: 29.5 % (ref 12.0–46.0)
Lymphs Abs: 1.3 10*3/uL (ref 0.7–4.0)
MCHC: 32.2 g/dL (ref 30.0–36.0)
MCV: 80.1 fl (ref 78.0–100.0)
Monocytes Absolute: 0.4 10*3/uL (ref 0.1–1.0)
Monocytes Relative: 8.4 % (ref 3.0–12.0)
Neutro Abs: 2.7 10*3/uL (ref 1.4–7.7)
Neutrophils Relative %: 59.5 % (ref 43.0–77.0)
Platelets: 226 10*3/uL (ref 150.0–400.0)
RBC: 4.7 Mil/uL (ref 4.22–5.81)
RDW: 16.7 % — ABNORMAL HIGH (ref 11.5–15.5)
WBC: 4.5 10*3/uL (ref 4.0–10.5)

## 2014-10-05 LAB — BASIC METABOLIC PANEL
BUN: 22 mg/dL (ref 6–23)
CO2: 30 mEq/L (ref 19–32)
Calcium: 10.1 mg/dL (ref 8.4–10.5)
Chloride: 105 mEq/L (ref 96–112)
Creatinine, Ser: 1.42 mg/dL (ref 0.40–1.50)
GFR: 61.97 mL/min (ref >60.00)
Glucose, Bld: 113 mg/dL — ABNORMAL HIGH (ref 70–99)
Potassium: 4.5 mEq/L (ref 3.5–5.1)
Sodium: 143 mEq/L (ref 135–145)

## 2014-10-05 LAB — URINALYSIS, ROUTINE W REFLEX MICROSCOPIC
Bilirubin Urine: NEGATIVE
Hgb urine dipstick: NEGATIVE
Ketones, ur: NEGATIVE
Leukocytes, UA: NEGATIVE
Nitrite: NEGATIVE
Specific Gravity, Urine: 1.02 (ref 1.000–1.030)
Total Protein, Urine: NEGATIVE
Urine Glucose: NEGATIVE
Urobilinogen, UA: 0.2 (ref 0.0–1.0)
pH: 6 (ref 5.0–8.0)

## 2014-10-05 LAB — MICROALBUMIN / CREATININE URINE RATIO
Creatinine,U: 149 mg/dL
Microalb Creat Ratio: 0.5 mg/g (ref 0.0–30.0)
Microalb, Ur: <0.7 mg/dL (ref 0.0–1.9)

## 2014-10-05 LAB — PSA: PSA: 2.72 ng/mL (ref 0.10–4.00)

## 2014-10-05 LAB — TSH: TSH: 2.47 u[IU]/mL (ref 0.35–4.50)

## 2014-10-05 LAB — HEMOGLOBIN A1C: Hgb A1c MFr Bld: 6.6 % — ABNORMAL HIGH (ref 4.6–6.5)

## 2014-10-05 NOTE — Assessment & Plan Note (Signed)
Also for psa as he is due,  to f/u any worsening symptoms or concerns

## 2014-10-05 NOTE — Assessment & Plan Note (Signed)
stable overall by history and exam, recent data reviewed with pt, and pt to continue medical treatment as before,  to f/u any worsening symptoms or concerns Lab Results  Component Value Date   LDLCALC 47 04/06/2014   For f/u lab

## 2014-10-05 NOTE — Addendum Note (Signed)
Addended by: Lyman Bishop on: 10/05/2014 10:57 AM   Modules accepted: Orders

## 2014-10-05 NOTE — Assessment & Plan Note (Signed)
stable overall by history and exam, recent data reviewed with pt, and pt to continue medical treatment as before,  to f/u any worsening symptoms or concerns Lab Results  Component Value Date   HGBA1C 6.9* 04/06/2014   For f/u labs

## 2014-10-05 NOTE — Assessment & Plan Note (Signed)
stable overall by history and exam, recent data reviewed with pt, and pt to continue medical treatment as before,  to f/u any worsening symptoms or concerns BP Readings from Last 3 Encounters:  10/05/14 114/60  09/26/14 139/73  08/14/14 126/60

## 2014-10-05 NOTE — Patient Instructions (Addendum)
You had the Td (tetanus) shot today  Please continue all other medications as before, and refills have been done if requested.  Please have the pharmacy call with any other refills you may need.  Please continue your efforts at being more active, low cholesterol diet, and weight control.  You are otherwise up to date with prevention measures today.  Please keep your appointments with your specialists as you may have planned  You will be contacted regarding the referral for: colonoscopy  Please go to the LAB in the Basement (turn left off the elevator) for the tests to be done today  You will be contacted by phone if any changes need to be made immediately.  Otherwise, you will receive a letter about your results with an explanation, but please check with MyChart first.  Please remember to sign up for MyChart if you have not done so, as this will be important to you in the future with finding out test results, communicating by private email, and scheduling acute appointments online when needed.  Please return in 6 months, or sooner if needed

## 2014-10-05 NOTE — Progress Notes (Signed)
Subjective:    Patient ID: Nicholas Patton, male    DOB: [birth year], 80 y.o.   MRN: [id number]  HPI Here for yearly f/u;  Overall doing ok;  Pt denies Chest pain, worsening SOB, DOE, wheezing, orthopnea, PND, worsening LE edema, palpitations, dizziness or syncope.  Pt denies neurological change such as new headache, facial or extremity weakness.  Pt denies polydipsia, polyuria, or low sugar symptoms. Pt states overall good compliance with treatment and medications, good tolerability, and has been trying to follow appropriate diet.  Pt denies worsening depressive symptoms, suicidal ideation or panic. No fever, night sweats, wt loss, loss of appetite, or other constitutional symptoms.  Pt states good ability with ADL's, has low fall risk, home safety reviewed and adequate, no other significant changes in hearing or vision, and only occasionally active with exercise. Last with episode parotitis, tx with amoxil, resolved. No current complaints Past Medical History  Diagnosis Date  . ALLERGIC RHINITIS 01/24/2007  . BENIGN PROSTATIC HYPERTROPHY 01/24/2007  . BRONCHITIS, ACUTE 06/11/2008  . Cough 01/24/2007  . DIABETES MELLITUS, TYPE II 01/24/2007  . FATIGUE 07/27/2007  . HYPERLIPIDEMIA 01/24/2007  . HYPERTENSION 10/05/2006  . INSOMNIA 06/21/2008  . LEG PAIN, BILATERAL 10/06/2007  . PERIPHERAL NEUROPATHY, FEET 10/06/2007  . PERS HX TOBACCO USE PRESENTING HAZARDS HEALTH 09/17/2009  . VITAMIN B12 DEFICIENCY 09/06/2008  . VITAMIN D DEFICIENCY 01/16/2010  . Wheezing 01/16/2010  . Cholecystitis, acute 07/22/2012  . Ileus following gastrointestinal surgery 07/22/2012  . Postoperative fever 07/22/2012  . Erectile dysfunction 09/22/2012  . Peripheral neuropathy    Past Surgical History  Procedure Laterality Date  . Total knee arthroplasty  2001    Left  . Turp vaporization    . Cholecystectomy N/A 07/18/2012    Procedure: LAPAROSCOPIC CHOLECYSTECTOMY WITH INTRAOPERATIVE CHOLANGIOGRAM;  Surgeon: Zenovia Jarred, MD;  Location: [place];  Service: General;  Laterality: N/A;    reports that he quit smoking about 21 years ago. His smoking use included Cigarettes. He has a 35 pack-year smoking history. He has never used smokeless tobacco. He reports that he does not drink alcohol or use illicit drugs. family history includes Diabetes Mellitus II in his mother; Lung cancer in his brother. No Known Allergies Current Outpatient Prescriptions on File Prior to Visit  Medication Sig Dispense Refill  . amLODipine (NORVASC) 5 MG tablet Take 1 tablet (5 mg total) by mouth daily. 90 tablet 3  . aspirin 81 MG EC tablet Take 81 mg by mouth daily.      Marland Kitchen atorvastatin (LIPITOR) 40 MG tablet Take 1 tablet by mouth  daily 90 tablet 3  . Cholecalciferol (VITAMIN D3) 1000 UNITS CAPS Take 1 capsule (1,000 Units total) by mouth daily. 90 capsule 3  . fluticasone (FLONASE) 50 MCG/ACT nasal spray Place 2 sprays into both nostrils daily. (Patient not taking: Reported on 10/05/2014) 16 g 1  . glimepiride (AMARYL) 1 MG tablet Take one-half tablet by  mouth two times daily  before meals 90 tablet 3  . levocetirizine (XYZAL) 5 MG tablet Take 1 tablet (5 mg total) by mouth every evening. 30 tablet 11  . losartan-hydrochlorothiazide (HYZAAR) 100-25 MG per tablet Take 1 tablet by mouth  daily 90 tablet 3  . metFORMIN (GLUCOPHAGE) 500 MG tablet Take 2 tablets by mouth  twice a day with meals 360 tablet 3  . Multiple Vitamins-Minerals (MULTIVITAMIN PO) Take 1 tablet by mouth daily.    . pantoprazole (PROTONIX) 40 MG tablet Take 1 tablet (  40 mg total) by mouth daily. 90 tablet 3  . penicillin v potassium (VEETID) 500 MG tablet Take 1 tablet (500 mg total) by mouth 3 (three) times daily. (Patient not taking: Reported on 10/05/2014) 15 tablet 0  . solifenacin (VESICARE) 5 MG tablet Take 1 tablet (5 mg total) by mouth daily. 90 tablet 3  . tamsulosin (FLOMAX) 0.4 MG CAPS capsule Take 1 capsule (0.4 mg total) by mouth daily. (Patient not  taking: Reported on 10/05/2014) 30 capsule 3   No current facility-administered medications on file prior to visit.   Review of Systems Constitutional: Negative for increased diaphoresis, other activity, appetite or siginficant weight change other than noted HENT: Negative for worsening hearing loss, ear pain, facial swelling, mouth sores and neck stiffness.   Eyes: Negative for other worsening pain, redness or visual disturbance.  Respiratory: Negative for shortness of breath and wheezing  Cardiovascular: Negative for chest pain and palpitations.  Gastrointestinal: Negative for diarrhea, blood in stool, abdominal distention or other pain Genitourinary: Negative for hematuria, flank pain or change in urine volume.  Musculoskeletal: Negative for myalgias or other joint complaints.  Skin: Negative for color change and wound or drainage.  Neurological: Negative for syncope and numbness. other than noted Hematological: Negative for adenopathy. or other swelling Psychiatric/Behavioral: Negative for hallucinations, SI, self-injury, decreased concentration or other worsening agitation.      Objective:   Physical Exam BP 114/60 mmHg  Pulse 66  Temp(Src) 98.4 F (36.9 C) (Oral)  Ht _0  (1.88 m)  Wt 184 lb (83.462 kg)  BMI 23.61 kg/m2  SpO2 96% VS noted,  Constitutional: Pt is oriented to person, place, and time. Appears well-developed and well-nourished, in no significant distress Head: Normocephalic and atraumatic.  Right Ear: External ear normal.  Left Ear: External ear normal.  Nose: Nose normal.  Mouth/Throat: Oropharynx is clear and moist.  Eyes: Conjunctivae and EOM are normal. Pupils are equal, round, and reactive to light.  Neck: Normal range of motion. Neck supple. No JVD present. No tracheal deviation present or significant neck LA or mass Cardiovascular: Normal rate, regular rhythm, normal heart sounds and intact distal pulses.   Pulmonary/Chest: Effort normal and breath  sounds without rales or wheezing  Abdominal: Soft. Bowel sounds are normal. NT. No HSM  Musculoskeletal: Normal range of motion. Exhibits no edema.  Lymphadenopathy:  Has no cervical adenopathy.  Neurological: Pt is alert and oriented to person, place, and time. Pt has normal reflexes. No cranial nerve deficit. Motor grossly intact Skin: Skin is warm and dry. No rash noted.  Psychiatric:  Has normal mood and affect. Behavior is normal.      Assessment & Plan:

## 2014-10-05 NOTE — Progress Notes (Signed)
Pre visit review using our clinic review tool, if applicable. No additional management support is needed unless otherwise documented below in the visit note.

## 2014-10-10 ENCOUNTER — Encounter: Payer: Self-pay | Admitting: Internal Medicine

## 2014-10-11 DIAGNOSIS — H43821 Vitreomacular adhesion, right eye: Secondary | ICD-10-CM | POA: Diagnosis not present

## 2014-10-30 ENCOUNTER — Ambulatory Visit (INDEPENDENT_AMBULATORY_CARE_PROVIDER_SITE_OTHER): Payer: Medicare Other | Admitting: Podiatry

## 2014-10-30 ENCOUNTER — Encounter: Payer: Self-pay | Admitting: Podiatry

## 2014-10-30 VITALS — BP 124/60 | HR 64

## 2014-10-30 DIAGNOSIS — M79606 Pain in leg, unspecified: Secondary | ICD-10-CM | POA: Diagnosis not present

## 2014-10-30 DIAGNOSIS — B351 Tinea unguium: Secondary | ICD-10-CM | POA: Diagnosis not present

## 2014-10-30 NOTE — Progress Notes (Signed)
Subjective:  80 year old diabetic male presents requesting toe nails and calluses trimmed.  Left first and 2nd toe are painful from thick callus on contact side of toes. Pain in right great toe medial border from callus build up.  No new problems.   Objective: Pedal pulses are all palpable.  Thick mycotic nails x 10. Mildly ingrown nails on both great toes. Plantar callus under the MPJ right.  No open lesions. No edema or erythema.  Inter digital keratoma 1st and 2nd digit at contact surface left foot.  Severe hallux valgus with bunion bilateral.  Severely contracted 2nd digit left.   Assessment: Diabetic neuropathy.  Hallux valgus with bunion bilateral.  Severe contracted hammer toe deformity 2nd left.  Mycotic nails x 10.   Plan: Debrided all corns and calluses and nails.  Patient will return in 3 months for palliation.

## 2014-10-30 NOTE — Patient Instructions (Signed)
Seen for hypertrophic nails. All nails debrided. Return in 3 months or as needed.

## 2014-12-06 ENCOUNTER — Ambulatory Visit (AMBULATORY_SURGERY_CENTER): Payer: Self-pay | Admitting: *Deleted

## 2014-12-06 VITALS — Ht 74.0 in | Wt 188.0 lb

## 2014-12-06 DIAGNOSIS — Z86010 Personal history of colonic polyps: Secondary | ICD-10-CM

## 2014-12-06 NOTE — Progress Notes (Signed)
No egg or soy allergy  No anesthesia or intubation problems per pt  No diet medications taken  Registered in [facility]

## 2014-12-13 ENCOUNTER — Ambulatory Visit (AMBULATORY_SURGERY_CENTER): Payer: Medicare Other | Admitting: Internal Medicine

## 2014-12-13 ENCOUNTER — Encounter: Payer: Self-pay | Admitting: Internal Medicine

## 2014-12-13 VITALS — BP 145/66 | HR 49 | Temp 97.2°F | Resp 14 | Ht 74.0 in | Wt 188.0 lb

## 2014-12-13 DIAGNOSIS — Z86010 Personal history of colonic polyps: Secondary | ICD-10-CM | POA: Diagnosis not present

## 2014-12-13 DIAGNOSIS — E119 Type 2 diabetes mellitus without complications: Secondary | ICD-10-CM | POA: Diagnosis not present

## 2014-12-13 DIAGNOSIS — I10 Essential (primary) hypertension: Secondary | ICD-10-CM | POA: Diagnosis not present

## 2014-12-13 DIAGNOSIS — D120 Benign neoplasm of cecum: Secondary | ICD-10-CM | POA: Diagnosis not present

## 2014-12-13 DIAGNOSIS — D123 Benign neoplasm of transverse colon: Secondary | ICD-10-CM | POA: Diagnosis not present

## 2014-12-13 DIAGNOSIS — D127 Benign neoplasm of rectosigmoid junction: Secondary | ICD-10-CM | POA: Diagnosis not present

## 2014-12-13 LAB — GLUCOSE, CAPILLARY
Glucose-Capillary: 88 mg/dL (ref 65–99)
Glucose-Capillary: 87 mg/dL (ref 65–99)

## 2014-12-13 MED ORDER — SODIUM CHLORIDE 0.9 % IV SOLN: 500.0000 mL | INTRAVENOUS | Status: DC

## 2014-12-13 NOTE — Progress Notes (Signed)
Transferred to recovery room. A/O x3, pleased with MAC.  VSS.  Report to Santiago Glad, Therapist, sports.

## 2014-12-13 NOTE — Progress Notes (Signed)
Called to room to assist during endoscopic procedure.  Patient ID and intended procedure confirmed with present staff. Received instructions for my participation in the procedure from the performing physician.

## 2014-12-13 NOTE — Patient Instructions (Signed)
YOU HAD AN ENDOSCOPIC PROCEDURE TODAY AT [facility]:   Refer to the procedure report that was given to you for any specific questions about what was found during the examination.  If the procedure report does not answer your questions, please call your gastroenterologist to clarify.  If you requested that your care partner not be given the details of your procedure findings, then the procedure report has been included in a sealed envelope for you to review at your convenience later.  YOU SHOULD EXPECT: Some feelings of bloating in the abdomen. Passage of more gas than usual.  Walking can help get rid of the air that was put into your GI tract during the procedure and reduce the bloating. If you had a lower endoscopy (such as a colonoscopy or flexible sigmoidoscopy) you may notice spotting of blood in your stool or on the toilet paper. If you underwent a bowel prep for your procedure, you may not have a normal bowel movement for a few days.  Please Note:  You might notice some irritation and congestion in your nose or some drainage.  This is from the oxygen used during your procedure.  There is no need for concern and it should clear up in a day or so.  SYMPTOMS TO REPORT IMMEDIATELY:   Following lower endoscopy (colonoscopy or flexible sigmoidoscopy):  Excessive amounts of blood in the stool  Significant tenderness or worsening of abdominal pains  Swelling of the abdomen that is new, acute  Fever of 100F or higher   For urgent or emergent issues, a gastroenterologist can be reached at any hour by calling [phone].   DIET: Your first meal following the procedure should be a small meal and then it is ok to progress to your normal diet. Heavy or fried foods are harder to digest and may make you feel nauseous or bloated.  Likewise, meals heavy in dairy and vegetables can increase bloating.  Drink plenty of fluids but you should avoid alcoholic beverages for 24  hours.  ACTIVITY:  You should plan to take it easy for the rest of today and you should NOT DRIVE or use heavy machinery until tomorrow (because of the sedation medicines used during the test).    FOLLOW UP: Our staff will call the number listed on your records the next business day following your procedure to check on you and address any questions or concerns that you may have regarding the information given to you following your procedure. If we do not reach you, we will leave a message.  However, if you are feeling well and you are not experiencing any problems, there is no need to return our call.  We will assume that you have returned to your regular daily activities without incident.  If any biopsies were taken you will be contacted by phone or by letter within the next 1-3 weeks.  Please call us at [phone] if you have not heard about the biopsies in 3 weeks.    SIGNATURES/CONFIDENTIALITY: You and/or your care partner have signed paperwork which will be entered into your electronic medical record.  These signatures attest to the fact that that the information above on your After Visit Summary has been reviewed and is understood.  Full responsibility of the confidentiality of this discharge information lies with you and/or your care-partner.  Polyps/ Hemorrhoid handout given Await pathology report High fiber diet

## 2014-12-13 NOTE — Op Note (Signed)
[place]  [address] [place], [address]   COLONOSCOPY PROCEDURE REPORT  PATIENT: Juddson, Cobern  MR#: [id number] BIRTHDATE: 09-29-1934 , 62  yrs. old GENDER: male ENDOSCOPIST: Jerene Bears, MD PROCEDURE DATE:  12/13/2014 PROCEDURE:   Colonoscopy, surveillance and Colonoscopy with snare polypectomy First Screening Colonoscopy - Avg.  risk and is 50 yrs.  old or older - No.  Prior Negative Screening - Now for repeat screening. N/A  History of Adenoma - Now for follow-up colonoscopy & has been > or = to 3 yrs.  Yes hx of adenoma.  Has been 3 or more years since last colonoscopy.  Polyps removed today? Yes ASA CLASS:   Class III INDICATIONS:Surveillance due to prior colonic neoplasia and PH Colon Adenoma, last colonoscopy 2011 by Dr. Sharlett Iles. MEDICATIONS: Monitored anesthesia care and Propofol 200 mg IV  DESCRIPTION OF PROCEDURE:   After the risks benefits and alternatives of the procedure were thoroughly explained, informed consent was obtained.  The digital rectal exam revealed no rectal mass.   The LB [id number]  endoscope was introduced through the anus and advanced to the cecum, which was identified by both the appendix and ileocecal valve. No adverse events experienced. The quality of the prep was good.  (Suprep was used)  The instrument was then slowly withdrawn as the colon was fully examined. Estimated blood loss is zero unless otherwise noted in this procedure report.   COLON FINDINGS: Four sessile polyps ranging between 3-19m in size were found at the cecum, in the transverse colon (2), and rectosigmoid colon.  Polypectomies were performed with a cold snare.  The resection was complete, the polyp tissue was completely retrieved and sent to histology.   There was moderate diverticulosis noted in the descending colon and sigmoid colon. Retroflexed views revealed internal hemorrhoids. The time to cecum = 4.0 Withdrawal time = 13.8   The  scope was withdrawn and the procedure completed. COMPLICATIONS: There were no immediate complications.  ENDOSCOPIC IMPRESSION: 1.   Four sessile polyps ranging between 3-748min size were found at the cecum, in the transverse colon, and rectosigmoid colon; polypectomies were performed with a cold snare 2.   Moderate diverticulosis was noted in the descending colon and sigmoid colon  RECOMMENDATIONS: 1.  Await pathology results 2.  High fiber diet 3.  Given your age, you will likely not need another colonoscopy for colon cancer screening or polyp surveillance.  These types of tests usually stop around the age 80 I will leave it to you and your primary care provider to determine if surveillance colonoscopy is indicated  eSigned:  JaJerene BearsMD 12/13/2014 12:10 PM   cc: JaBiagio BorgMD and The Patient   PATIENT NAME:  HaDonal, LynamR#: [id number]

## 2014-12-14 ENCOUNTER — Telehealth: Payer: Self-pay | Admitting: *Deleted

## 2014-12-14 NOTE — Telephone Encounter (Signed)
  Follow up Call-  Call back number 12/13/2014  Post procedure Call Back phone  # [phone]  Permission to leave phone message Yes     Patient questions:  Do you have a fever, pain , or abdominal swelling? No. Pain Score  0 *  Have you tolerated food without any problems? No.  Have you been able to return to your normal activities? Yes.    Do you have any questions about your discharge instructions: Diet   No. Medications  No. Follow up visit  No.  Do you have questions or concerns about your Care? No.  Actions: * If pain score is 4 or above: No action needed, pain <4.

## 2014-12-18 ENCOUNTER — Encounter: Payer: Self-pay | Admitting: Internal Medicine

## 2014-12-25 DIAGNOSIS — M47812 Spondylosis without myelopathy or radiculopathy, cervical region: Secondary | ICD-10-CM | POA: Diagnosis not present

## 2014-12-27 DIAGNOSIS — M542 Cervicalgia: Secondary | ICD-10-CM | POA: Diagnosis not present

## 2014-12-27 DIAGNOSIS — M6281 Muscle weakness (generalized): Secondary | ICD-10-CM | POA: Diagnosis not present

## 2014-12-27 DIAGNOSIS — M2560 Stiffness of unspecified joint, not elsewhere classified: Secondary | ICD-10-CM | POA: Diagnosis not present

## 2014-12-27 DIAGNOSIS — M791 Myalgia: Secondary | ICD-10-CM | POA: Diagnosis not present

## 2014-12-31 DIAGNOSIS — M2560 Stiffness of unspecified joint, not elsewhere classified: Secondary | ICD-10-CM | POA: Diagnosis not present

## 2014-12-31 DIAGNOSIS — M6281 Muscle weakness (generalized): Secondary | ICD-10-CM | POA: Diagnosis not present

## 2014-12-31 DIAGNOSIS — M542 Cervicalgia: Secondary | ICD-10-CM | POA: Diagnosis not present

## 2014-12-31 DIAGNOSIS — M791 Myalgia: Secondary | ICD-10-CM | POA: Diagnosis not present

## 2015-01-04 DIAGNOSIS — M2560 Stiffness of unspecified joint, not elsewhere classified: Secondary | ICD-10-CM | POA: Diagnosis not present

## 2015-01-04 DIAGNOSIS — M6281 Muscle weakness (generalized): Secondary | ICD-10-CM | POA: Diagnosis not present

## 2015-01-04 DIAGNOSIS — M791 Myalgia: Secondary | ICD-10-CM | POA: Diagnosis not present

## 2015-01-04 DIAGNOSIS — M542 Cervicalgia: Secondary | ICD-10-CM | POA: Diagnosis not present

## 2015-01-07 DIAGNOSIS — M542 Cervicalgia: Secondary | ICD-10-CM | POA: Diagnosis not present

## 2015-01-07 DIAGNOSIS — M2560 Stiffness of unspecified joint, not elsewhere classified: Secondary | ICD-10-CM | POA: Diagnosis not present

## 2015-01-07 DIAGNOSIS — M791 Myalgia: Secondary | ICD-10-CM | POA: Diagnosis not present

## 2015-01-07 DIAGNOSIS — M6281 Muscle weakness (generalized): Secondary | ICD-10-CM | POA: Diagnosis not present

## 2015-01-11 DIAGNOSIS — M6281 Muscle weakness (generalized): Secondary | ICD-10-CM | POA: Diagnosis not present

## 2015-01-11 DIAGNOSIS — M542 Cervicalgia: Secondary | ICD-10-CM | POA: Diagnosis not present

## 2015-01-11 DIAGNOSIS — M2560 Stiffness of unspecified joint, not elsewhere classified: Secondary | ICD-10-CM | POA: Diagnosis not present

## 2015-01-11 DIAGNOSIS — M791 Myalgia: Secondary | ICD-10-CM | POA: Diagnosis not present

## 2015-01-14 DIAGNOSIS — M791 Myalgia: Secondary | ICD-10-CM | POA: Diagnosis not present

## 2015-01-14 DIAGNOSIS — M542 Cervicalgia: Secondary | ICD-10-CM | POA: Diagnosis not present

## 2015-01-14 DIAGNOSIS — M2560 Stiffness of unspecified joint, not elsewhere classified: Secondary | ICD-10-CM | POA: Diagnosis not present

## 2015-01-14 DIAGNOSIS — M6281 Muscle weakness (generalized): Secondary | ICD-10-CM | POA: Diagnosis not present

## 2015-01-18 DIAGNOSIS — M6281 Muscle weakness (generalized): Secondary | ICD-10-CM | POA: Diagnosis not present

## 2015-01-18 DIAGNOSIS — M542 Cervicalgia: Secondary | ICD-10-CM | POA: Diagnosis not present

## 2015-01-18 DIAGNOSIS — M791 Myalgia: Secondary | ICD-10-CM | POA: Diagnosis not present

## 2015-01-18 DIAGNOSIS — M2560 Stiffness of unspecified joint, not elsewhere classified: Secondary | ICD-10-CM | POA: Diagnosis not present

## 2015-01-25 DIAGNOSIS — M542 Cervicalgia: Secondary | ICD-10-CM | POA: Diagnosis not present

## 2015-01-28 DIAGNOSIS — M47812 Spondylosis without myelopathy or radiculopathy, cervical region: Secondary | ICD-10-CM | POA: Diagnosis not present

## 2015-01-30 ENCOUNTER — Ambulatory Visit (INDEPENDENT_AMBULATORY_CARE_PROVIDER_SITE_OTHER): Payer: Medicare Other | Admitting: Podiatry

## 2015-01-30 ENCOUNTER — Encounter: Payer: Self-pay | Admitting: Podiatry

## 2015-01-30 VITALS — BP 150/65 | HR 60

## 2015-01-30 DIAGNOSIS — B351 Tinea unguium: Secondary | ICD-10-CM

## 2015-01-30 DIAGNOSIS — M79606 Pain in leg, unspecified: Secondary | ICD-10-CM

## 2015-01-30 DIAGNOSIS — E1142 Type 2 diabetes mellitus with diabetic polyneuropathy: Secondary | ICD-10-CM | POA: Diagnosis not present

## 2015-01-30 NOTE — Progress Notes (Signed)
Subjective:  80 year old diabetic male presents requesting toe nails and calluses trimmed.  Left first and 2nd toe are painful from thick callus on contact side of toes. Pain in right great toe medial border from callus build up.  Blood sugar is under control.   Objective: Pedal pulses are all palpable.  Thick mycotic nails x 10. Mildly ingrown nails on both great toes. Plantar callus under the MPJ right.  No open lesions. No edema or erythema.  Inter digital keratoma 1st and 2nd digit at contact surface left foot.  Severe hallux valgus with bunion bilateral.  Severely contracted 2nd digit left.   Assessment: Diabetic neuropathy.  Hallux valgus with bunion bilateral.  Severe contracted hammer toe deformity 2nd left.  Mycotic nails x 10.   Plan: Debrided all corns and calluses and nails.  Patient will return in 3 months for palliation.

## 2015-01-30 NOTE — Patient Instructions (Signed)
Seen for hypertrophic nails. All nails debrided. Return in 3 months or as needed.  

## 2015-02-08 DIAGNOSIS — Z23 Encounter for immunization: Secondary | ICD-10-CM | POA: Diagnosis not present

## 2015-04-09 ENCOUNTER — Other Ambulatory Visit (INDEPENDENT_AMBULATORY_CARE_PROVIDER_SITE_OTHER): Payer: Medicare Other

## 2015-04-09 ENCOUNTER — Other Ambulatory Visit: Payer: Self-pay

## 2015-04-09 ENCOUNTER — Telehealth: Payer: Self-pay | Admitting: Internal Medicine

## 2015-04-09 ENCOUNTER — Ambulatory Visit (INDEPENDENT_AMBULATORY_CARE_PROVIDER_SITE_OTHER): Payer: Medicare Other | Admitting: Internal Medicine

## 2015-04-09 ENCOUNTER — Ambulatory Visit: Payer: Medicare Other | Admitting: Internal Medicine

## 2015-04-09 ENCOUNTER — Encounter: Payer: Self-pay | Admitting: Internal Medicine

## 2015-04-09 VITALS — BP 128/78 | HR 59 | Temp 98.6°F | Resp 20 | Ht 74.0 in | Wt 187.0 lb

## 2015-04-09 DIAGNOSIS — R972 Elevated prostate specific antigen [PSA]: Secondary | ICD-10-CM | POA: Diagnosis not present

## 2015-04-09 DIAGNOSIS — E1140 Type 2 diabetes mellitus with diabetic neuropathy, unspecified: Secondary | ICD-10-CM

## 2015-04-09 DIAGNOSIS — J309 Allergic rhinitis, unspecified: Secondary | ICD-10-CM

## 2015-04-09 DIAGNOSIS — I10 Essential (primary) hypertension: Secondary | ICD-10-CM | POA: Diagnosis not present

## 2015-04-09 DIAGNOSIS — N3281 Overactive bladder: Secondary | ICD-10-CM | POA: Diagnosis not present

## 2015-04-09 LAB — MICROALBUMIN / CREATININE URINE RATIO
Creatinine,U: 55 mg/dL
Microalb Creat Ratio: 0.4 mg/g (ref 0.0–30.0)
Microalb, Ur: 0.2 mg/dL (ref 0.0–1.9)

## 2015-04-09 LAB — URINALYSIS, ROUTINE W REFLEX MICROSCOPIC
Bilirubin Urine: NEGATIVE
Hgb urine dipstick: NEGATIVE
Ketones, ur: NEGATIVE
Leukocytes, UA: NEGATIVE
Nitrite: NEGATIVE
RBC / HPF: NONE SEEN (ref 0–?)
Specific Gravity, Urine: <=1.005 — AB (ref 1.000–1.030)
Total Protein, Urine: NEGATIVE
Urine Glucose: NEGATIVE
Urobilinogen, UA: 0.2 (ref 0.0–1.0)
WBC, UA: NONE SEEN (ref 0–?)
pH: 6 (ref 5.0–8.0)

## 2015-04-09 LAB — CBC WITH DIFFERENTIAL/PLATELET
Basophils Absolute: 0 10*3/uL (ref 0.0–0.1)
Basophils Relative: 0.4 % (ref 0.0–3.0)
Eosinophils Absolute: 0.2 10*3/uL (ref 0.0–0.7)
Eosinophils Relative: 4 % (ref 0.0–5.0)
HCT: 42 % (ref 39.0–52.0)
Hemoglobin: 13.4 g/dL (ref 13.0–17.0)
Lymphocytes Relative: 34.9 % (ref 12.0–46.0)
Lymphs Abs: 1.4 10*3/uL (ref 0.7–4.0)
MCHC: 32 g/dL (ref 30.0–36.0)
MCV: 82.4 fl (ref 78.0–100.0)
Monocytes Absolute: 0.3 10*3/uL (ref 0.1–1.0)
Monocytes Relative: 8.4 % (ref 3.0–12.0)
Neutro Abs: 2.1 10*3/uL (ref 1.4–7.7)
Neutrophils Relative %: 52.3 % (ref 43.0–77.0)
Platelets: 227 10*3/uL (ref 150.0–400.0)
RBC: 5.1 Mil/uL (ref 4.22–5.81)
RDW: 16.9 % — ABNORMAL HIGH (ref 11.5–15.5)
WBC: 3.9 10*3/uL — ABNORMAL LOW (ref 4.0–10.5)

## 2015-04-09 LAB — BASIC METABOLIC PANEL
BUN: 20 mg/dL (ref 6–23)
CO2: 28 mEq/L (ref 19–32)
Calcium: 10.4 mg/dL (ref 8.4–10.5)
Chloride: 104 mEq/L (ref 96–112)
Creatinine, Ser: 1.35 mg/dL (ref 0.40–1.50)
GFR: 65.61 mL/min (ref >60.00)
Glucose, Bld: 104 mg/dL — ABNORMAL HIGH (ref 70–99)
Potassium: 5 mEq/L (ref 3.5–5.1)
Sodium: 142 mEq/L (ref 135–145)

## 2015-04-09 LAB — LIPID PANEL
Cholesterol: 134 mg/dL (ref 0–200)
HDL: 60.6 mg/dL (ref >39.00)
LDL Cholesterol: 58 mg/dL (ref 0–99)
NonHDL: 73.19
Total CHOL/HDL Ratio: 2
Triglycerides: 76 mg/dL (ref 0.0–149.0)
VLDL: 15.2 mg/dL (ref 0.0–40.0)

## 2015-04-09 LAB — HEMOGLOBIN A1C: Hgb A1c MFr Bld: 6.8 % — ABNORMAL HIGH (ref 4.6–6.5)

## 2015-04-09 LAB — HEPATIC FUNCTION PANEL
ALT: 17 U/L (ref 0–53)
AST: 23 U/L (ref 0–37)
Albumin: 4.7 g/dL (ref 3.5–5.2)
Alkaline Phosphatase: 97 U/L (ref 39–117)
Bilirubin, Direct: 0.1 mg/dL (ref 0.0–0.3)
Total Bilirubin: 0.5 mg/dL (ref 0.2–1.2)
Total Protein: 7.5 g/dL (ref 6.0–8.3)

## 2015-04-09 LAB — TSH: TSH: 3.01 u[IU]/mL (ref 0.35–4.50)

## 2015-04-09 LAB — PSA: PSA: 3.21 ng/mL (ref 0.10–4.00)

## 2015-04-09 MED ORDER — AMLODIPINE BESYLATE 5 MG PO TABS: 5.0000 mg | ORAL_TABLET | Freq: Every day | ORAL | Status: DC

## 2015-04-09 MED ORDER — CETIRIZINE HCL 10 MG PO TABS: 10.0000 mg | ORAL_TABLET | Freq: Every day | ORAL | Status: DC

## 2015-04-09 MED ORDER — GLIMEPIRIDE 1 MG PO TABS: ORAL_TABLET | ORAL | Status: DC

## 2015-04-09 MED ORDER — TOLTERODINE TARTRATE ER 4 MG PO CP24: 4.0000 mg | ORAL_CAPSULE | Freq: Every day | ORAL | Status: DC

## 2015-04-09 MED ORDER — METFORMIN HCL 500 MG PO TABS: ORAL_TABLET | ORAL | Status: DC

## 2015-04-09 MED ORDER — PANTOPRAZOLE SODIUM 40 MG PO TBEC
40.0000 mg | DELAYED_RELEASE_TABLET | Freq: Every day | ORAL | Status: DC
Start: 2015-04-09 — End: 2015-04-09

## 2015-04-09 MED ORDER — PANTOPRAZOLE SODIUM 40 MG PO TBEC: 40.0000 mg | DELAYED_RELEASE_TABLET | Freq: Every day | ORAL | Status: DC

## 2015-04-09 MED ORDER — ATORVASTATIN CALCIUM 40 MG PO TABS: ORAL_TABLET | ORAL | Status: DC

## 2015-04-09 MED ORDER — LOSARTAN POTASSIUM-HCTZ 100-25 MG PO TABS: ORAL_TABLET | ORAL | Status: DC

## 2015-04-09 MED ORDER — SILDENAFIL CITRATE 100 MG PO TABS: 50.0000 mg | ORAL_TABLET | Freq: Every day | ORAL | Status: DC | PRN

## 2015-04-09 MED ORDER — SOLIFENACIN SUCCINATE 5 MG PO TABS: 5.0000 mg | ORAL_TABLET | Freq: Every day | ORAL | Status: DC

## 2015-04-09 NOTE — Progress Notes (Signed)
Subjective:    Patient ID: Nicholas Patton, male    DOB: 1934/11/16, 81 y.o.   MRN: [id number]  HPI  Here for yearly f/u;  Overall doing ok;  Pt denies Chest pain, worsening SOB, DOE, wheezing, orthopnea, PND, worsening LE edema, palpitations, dizziness or syncope.  Pt denies neurological change such as new headache, facial or extremity weakness.  Pt denies polydipsia, polyuria, or low sugar symptoms. Pt states overall good compliance with treatment and medications, good tolerability, and has been trying to follow appropriate diet.  Pt denies worsening depressive symptoms, suicidal ideation or panic. No fever, night sweats, wt loss, loss of appetite, or other constitutional symptoms.  Pt states good ability with ADL's, has low fall risk, home safety reviewed and adequate, no other significant changes in hearing or vision, and only occasionally active with exercise.  Also gets too dry mouth with vesicare, asks for change. Also Does have several wks ongoing nasal allergy symptoms with clearish congestion, itch and sneezing, without fever, pain, ST, cough, swelling or wheezing. Denies urinary symptoms such as dysuria, frequency, urgency, flank pain, hematuria or n/v, fever, chills. Wt Readings from Last 3 Encounters:  04/09/15 187 lb (84.823 kg)  12/13/14 188 lb (85.276 kg)  12/06/14 188 lb (85.276 kg)  Does have ongoing Ed symptoms, meds are expensive but asks for viagra refill Past Medical History  Diagnosis Date  . ALLERGIC RHINITIS 01/24/2007  . BENIGN PROSTATIC HYPERTROPHY 01/24/2007  . BRONCHITIS, ACUTE 06/11/2008  . Cough 01/24/2007  . DIABETES MELLITUS, TYPE II 01/24/2007  . FATIGUE 07/27/2007  . HYPERLIPIDEMIA 01/24/2007  . HYPERTENSION 10/05/2006  . INSOMNIA 06/21/2008  . LEG PAIN, BILATERAL 10/06/2007  . PERIPHERAL NEUROPATHY, FEET 10/06/2007  . PERS HX TOBACCO USE PRESENTING HAZARDS HEALTH 09/17/2009  . VITAMIN B12 DEFICIENCY 09/06/2008  . VITAMIN D DEFICIENCY 01/16/2010  . Wheezing  01/16/2010  . Cholecystitis, acute 07/22/2012  . Ileus following gastrointestinal surgery 07/22/2012  . Postoperative fever 07/22/2012  . Erectile dysfunction 09/22/2012  . Peripheral neuropathy ([place])   . Arthritis   . Allergy   . GERD (gastroesophageal reflux disease)    Past Surgical History  Procedure Laterality Date  . Total knee arthroplasty  2001    Left  . Turp vaporization    . Cholecystectomy N/A 07/18/2012    Procedure: LAPAROSCOPIC CHOLECYSTECTOMY WITH INTRAOPERATIVE CHOLANGIOGRAM;  Surgeon: Zenovia Jarred, MD;  Location: [facility];  Service: General;  Laterality: N/A;  . Cataract extraction      both eyes  . Colonoscopy      reports that he quit smoking about 21 years ago. His smoking use included Cigarettes. He has a 35 pack-year smoking history. He has never used smokeless tobacco. He reports that he does not drink alcohol or use illicit drugs. family history includes Diabetes Mellitus II in his mother; Lung cancer in his brother. There is no history of Colon cancer, Esophageal cancer, Rectal cancer, or Stomach cancer. No Known Allergies Current Outpatient Prescriptions on File Prior to Visit  Medication Sig Dispense Refill  . aspirin 81 MG EC tablet Take 81 mg by mouth daily.      . Cholecalciferol (VITAMIN D3) 1000 UNITS CAPS Take 1 capsule (1,000 Units total) by mouth daily. 90 capsule 3  . Multiple Vitamins-Minerals (MULTIVITAMIN PO) Take 1 tablet by mouth daily.     No current facility-administered medications on file prior to visit.     Review of Systems Constitutional: Negative for increased diaphoresis, other activity, appetite or siginficant  weight change other than noted HENT: Negative for worsening hearing loss, ear pain, facial swelling, mouth sores and neck stiffness.   Eyes: Negative for other worsening pain, redness or visual disturbance.  Respiratory: Negative for shortness of breath and wheezing  Cardiovascular: Negative for chest pain and palpitations.    Gastrointestinal: Negative for diarrhea, blood in stool, abdominal distention or other pain Genitourinary: Negative for hematuria, flank pain or change in urine volume.  Musculoskeletal: Negative for myalgias or other joint complaints.  Skin: Negative for color change and wound or drainage.  Neurological: Negative for syncope and numbness. other than noted Hematological: Negative for adenopathy. or other swelling Psychiatric/Behavioral: Negative for hallucinations, SI, self-injury, decreased concentration or other worsening agitation.      Objective:   Physical Exam BP 128/78 mmHg  Pulse 59  Temp(Src) 98.6 F (37 C) (Oral)  Resp 20  Ht _0  (1.88 m)  Wt 187 lb (84.823 kg)  BMI 24.00 kg/m2  SpO2 95% VS noted,  Constitutional: Pt is oriented to person, place, and time. Appears well-developed and well-nourished, in no significant distress Head: Normocephalic and atraumatic.  Right Ear: External ear normal.  Left Ear: External ear normal.  Nose: Nose normal.  Mouth/Throat: Oropharynx is clear and moist.  Bilat tm's with mild erythema.  Max sinus areas non tender.  Pharynx with mild erythema, no exudate Eyes: Conjunctivae and EOM are normal. Pupils are equal, round, and reactive to light.  Neck: Normal range of motion. Neck supple. No JVD present. No tracheal deviation present or significant neck LA or mass Cardiovascular: Normal rate, regular rhythm, normal heart sounds and intact distal pulses.   Pulmonary/Chest: Effort normal and breath sounds without rales or wheezing  Abdominal: Soft. Bowel sounds are normal. NT. No HSM  Musculoskeletal: Normal range of motion. Exhibits no edema.  Lymphadenopathy:  Has no cervical adenopathy.  Neurological: Pt is alert and oriented to person, place, and time. Pt has normal reflexes. No cranial nerve deficit. Motor grossly intact Skin: Skin is warm and dry. No rash noted.  Psychiatric:  Has normal mood and affect. Behavior is normal.      Assessment & Plan:

## 2015-04-09 NOTE — Assessment & Plan Note (Signed)
stable overall by history and exam, recent data reviewed with pt, and pt to continue medical treatment as before,  to f/u any worsening symptoms or concerns BP Readings from Last 3 Encounters:  04/09/15 128/78  01/30/15 150/65  12/13/14 145/66

## 2015-04-09 NOTE — Assessment & Plan Note (Signed)
stable overall by history and exam, recent data reviewed with pt, and pt to continue medical treatment as before,  to f/u any worsening symptoms or concerns Lab Results  Component Value Date   HGBA1C 6.6* 10/05/2014   For f/u lab today

## 2015-04-09 NOTE — Assessment & Plan Note (Signed)
Borderline concerning, pt asks for f/u psa, asympt

## 2015-04-09 NOTE — Progress Notes (Signed)
Pre visit review using our clinic review tool, if applicable. No additional management support is needed unless otherwise documented below in the visit note.

## 2015-04-09 NOTE — Patient Instructions (Signed)
OK to stop the Vesicare  Please take all new medication as prescribed - the Detrol LA 4 mg (sent to your pharmacy), as well as Zyrtec 10 mg as needed for allergies  Please continue all other medications as before, and refills have been done if requested - the Viagra  Please have the pharmacy call with any other refills you may need.  Please continue your efforts at being more active, low cholesterol diet, and weight control.  You are otherwise up to date with prevention measures today.  Please keep your appointments with your specialists as you may have planned  Please go to the LAB in the Basement (turn left off the elevator) for the tests to be done today  You will be contacted by phone if any changes need to be made immediately.  Otherwise, you will receive a letter about your results with an explanation, but please check with MyChart first.  Please remember to sign up for MyChart if you have not done so, as this will be important to you in the future with finding out test results, communicating by private email, and scheduling acute appointments online when needed.  Please return in 6 months, or sooner if needed

## 2015-04-09 NOTE — Telephone Encounter (Signed)
Pt called and ask for ALL med to resend to [facility]. Please help, we sent to [facility].

## 2015-04-09 NOTE — Assessment & Plan Note (Signed)
Mild to mod, for zyrtec prn,  to f/u any worsening symptoms or concerns

## 2015-04-09 NOTE — Assessment & Plan Note (Signed)
With mild dry mouth with vesicare, ok to try change to detrol LA 4 mg - done erx,  to f/u any worsening symptoms or concerns

## 2015-04-15 ENCOUNTER — Other Ambulatory Visit: Payer: Self-pay | Admitting: Internal Medicine

## 2015-04-16 DIAGNOSIS — E119 Type 2 diabetes mellitus without complications: Secondary | ICD-10-CM | POA: Diagnosis not present

## 2015-04-16 DIAGNOSIS — Z961 Presence of intraocular lens: Secondary | ICD-10-CM | POA: Diagnosis not present

## 2015-04-16 DIAGNOSIS — H353131 Nonexudative age-related macular degeneration, bilateral, early dry stage: Secondary | ICD-10-CM | POA: Diagnosis not present

## 2015-04-16 DIAGNOSIS — H40013 Open angle with borderline findings, low risk, bilateral: Secondary | ICD-10-CM | POA: Diagnosis not present

## 2015-04-16 DIAGNOSIS — H4322 Crystalline deposits in vitreous body, left eye: Secondary | ICD-10-CM | POA: Diagnosis not present

## 2015-05-01 ENCOUNTER — Telehealth: Payer: Self-pay | Admitting: *Deleted

## 2015-05-01 NOTE — Telephone Encounter (Signed)
Received call pt states the Detrol LA is too expensive. Med cost over $250, and he can not afford. Wanting to know if he can change to flomax, if ok wanting rx sent to [facility]...Nicholas Patton

## 2015-05-02 ENCOUNTER — Encounter: Payer: Self-pay | Admitting: Podiatry

## 2015-05-02 ENCOUNTER — Ambulatory Visit (INDEPENDENT_AMBULATORY_CARE_PROVIDER_SITE_OTHER): Payer: Medicare Other | Admitting: Podiatry

## 2015-05-02 VITALS — BP 134/65 | HR 60

## 2015-05-02 DIAGNOSIS — B351 Tinea unguium: Secondary | ICD-10-CM

## 2015-05-02 DIAGNOSIS — M79606 Pain in leg, unspecified: Secondary | ICD-10-CM | POA: Diagnosis not present

## 2015-05-02 MED ORDER — FESOTERODINE FUMARATE ER 4 MG PO TB24: 4.0000 mg | ORAL_TABLET | Freq: Every day | ORAL | Status: DC

## 2015-05-02 NOTE — Telephone Encounter (Signed)
Called pt no answer LMOM RTC.../lmb 

## 2015-05-02 NOTE — Telephone Encounter (Signed)
Pt LMOM to cal back and leave msg on the vm. Called pt back no answer left detail msg below on vm...Nicholas Patton

## 2015-05-02 NOTE — Patient Instructions (Signed)
Seen for hypertrophic nails. All nails debrided. Return in 3 months or as needed.

## 2015-05-02 NOTE — Telephone Encounter (Signed)
Unfortunately the flomax is for the prostate, and detrol was for the urinary bladder overactivity  OK to try change detrol to toviaz to see if this is covered  If not, please have pt find out from his insurance which similar medication might be covered, as I would not be able to know this (such as oxybutinin, or sanctura as well)

## 2015-05-02 NOTE — Progress Notes (Signed)
Subjective:  81 year old diabetic male presents requesting toe nails and calluses trimmed. Pain in right great toe medial border from callus build up. Blood sugar is under control.   Objective: Pedal pulses are all palpable.  Thick mycotic nails x 10. Mildly ingrown nails on both great toes. Plantar callus under the MPJ right.  No open lesions. No edema or erythema.  Inter digital keratoma 1st and 2nd digit at contact surface left foot.  Severe hallux valgus with bunion bilateral.  Severely contracted 2nd digit left.   Assessment: Diabetic neuropathy.  Hallux valgus with bunion bilateral.  Severe contracted hammer toe deformity 2nd left.  Mycotic nails x 10.   Plan: Debrided all corns and calluses and nails.  Patient will return in 3 months for palliation.

## 2015-05-06 DIAGNOSIS — M542 Cervicalgia: Secondary | ICD-10-CM | POA: Diagnosis not present

## 2015-06-04 ENCOUNTER — Other Ambulatory Visit: Payer: Self-pay | Admitting: Neurological Surgery

## 2015-06-04 DIAGNOSIS — M542 Cervicalgia: Secondary | ICD-10-CM | POA: Diagnosis not present

## 2015-06-19 ENCOUNTER — Encounter (HOSPITAL_COMMUNITY)
Admission: RE | Admit: 2015-06-19 | Discharge: 2015-06-19 | Disposition: A | Discharge status: Home / Self Care | Payer: Medicare Other | Source: Ambulatory Visit | Attending: Neurological Surgery | Admitting: Neurological Surgery

## 2015-06-19 ENCOUNTER — Encounter (HOSPITAL_COMMUNITY): Payer: Self-pay

## 2015-06-19 DIAGNOSIS — Z7982 Long term (current) use of aspirin: Secondary | ICD-10-CM | POA: Insufficient documentation

## 2015-06-19 DIAGNOSIS — M479 Spondylosis, unspecified: Secondary | ICD-10-CM | POA: Insufficient documentation

## 2015-06-19 DIAGNOSIS — Z7984 Long term (current) use of oral hypoglycemic drugs: Secondary | ICD-10-CM | POA: Diagnosis not present

## 2015-06-19 DIAGNOSIS — E1142 Type 2 diabetes mellitus with diabetic polyneuropathy: Secondary | ICD-10-CM | POA: Diagnosis not present

## 2015-06-19 DIAGNOSIS — Z01812 Encounter for preprocedural laboratory examination: Secondary | ICD-10-CM | POA: Insufficient documentation

## 2015-06-19 DIAGNOSIS — K219 Gastro-esophageal reflux disease without esophagitis: Secondary | ICD-10-CM | POA: Diagnosis not present

## 2015-06-19 DIAGNOSIS — Z0183 Encounter for blood typing: Secondary | ICD-10-CM | POA: Insufficient documentation

## 2015-06-19 DIAGNOSIS — Z79899 Other long term (current) drug therapy: Secondary | ICD-10-CM | POA: Insufficient documentation

## 2015-06-19 DIAGNOSIS — Z87891 Personal history of nicotine dependence: Secondary | ICD-10-CM | POA: Diagnosis not present

## 2015-06-19 DIAGNOSIS — E785 Hyperlipidemia, unspecified: Secondary | ICD-10-CM | POA: Diagnosis not present

## 2015-06-19 DIAGNOSIS — I10 Essential (primary) hypertension: Secondary | ICD-10-CM | POA: Insufficient documentation

## 2015-06-19 DIAGNOSIS — M5030 Other cervical disc degeneration, unspecified cervical region: Secondary | ICD-10-CM | POA: Diagnosis not present

## 2015-06-19 HISTORY — DX: Personal history of colon polyps, unspecified: Z86.0100

## 2015-06-19 HISTORY — DX: Personal history of colonic polyps: Z86.010

## 2015-06-19 HISTORY — DX: Pain in unspecified joint: M25.50

## 2015-06-19 LAB — BASIC METABOLIC PANEL
Anion gap: 12 (ref 5–15)
BUN: 23 mg/dL — ABNORMAL HIGH (ref 6–20)
CO2: 25 mmol/L (ref 22–32)
Calcium: 9.6 mg/dL (ref 8.9–10.3)
Chloride: 104 mmol/L (ref 101–111)
Creatinine, Ser: 1.49 mg/dL — ABNORMAL HIGH (ref 0.61–1.24)
GFR calc Af Amer: 50 mL/min — ABNORMAL LOW (ref >60)
GFR calc non Af Amer: 43 mL/min — ABNORMAL LOW (ref >60)
Glucose, Bld: 103 mg/dL — ABNORMAL HIGH (ref 65–99)
Potassium: 4.3 mmol/L (ref 3.5–5.1)
Sodium: 141 mmol/L (ref 135–145)

## 2015-06-19 LAB — CBC WITH DIFFERENTIAL/PLATELET
Basophils Absolute: 0 10*3/uL (ref 0.0–0.1)
Basophils Relative: 1 %
Eosinophils Absolute: 0.1 10*3/uL (ref 0.0–0.7)
Eosinophils Relative: 3 %
HCT: 39.3 % (ref 39.0–52.0)
Hemoglobin: 12.6 g/dL — ABNORMAL LOW (ref 13.0–17.0)
Lymphocytes Relative: 35 %
Lymphs Abs: 1.2 10*3/uL (ref 0.7–4.0)
MCH: 27.2 pg (ref 26.0–34.0)
MCHC: 32.1 g/dL (ref 30.0–36.0)
MCV: 84.9 fL (ref 78.0–100.0)
Monocytes Absolute: 0.2 10*3/uL (ref 0.1–1.0)
Monocytes Relative: 7 %
Neutro Abs: 2 10*3/uL (ref 1.7–7.7)
Neutrophils Relative %: 54 %
Platelets: 205 10*3/uL (ref 150–400)
RBC: 4.63 MIL/uL (ref 4.22–5.81)
RDW: 15.5 % (ref 11.5–15.5)
WBC: 3.5 10*3/uL — ABNORMAL LOW (ref 4.0–10.5)

## 2015-06-19 LAB — TYPE AND SCREEN
ABO/RH(D): O POS
Antibody Screen: NEGATIVE

## 2015-06-19 LAB — SURGICAL PCR SCREEN
MRSA, PCR: NEGATIVE
Staphylococcus aureus: NEGATIVE

## 2015-06-19 LAB — PROTIME-INR
INR: 1.12 (ref 0.00–1.49)
Prothrombin Time: 14.5 seconds (ref 11.6–15.2)

## 2015-06-19 LAB — ABO/RH: ABO/RH(D): O POS

## 2015-06-19 LAB — GLUCOSE, CAPILLARY: Glucose-Capillary: 103 mg/dL — ABNORMAL HIGH (ref 65–99)

## 2015-06-19 NOTE — Progress Notes (Addendum)
Cardiologist denies  Medical Md is Dr.James John  EKG and CXR in epic from 08-13-14  Echo report in epic from 2016  Stress test reports in epic from 2000/2016  Heart cath denies

## 2015-06-19 NOTE — Pre-Procedure Instructions (Signed)
Nicholas Patton  06/19/2015      [place] [place] [address] Phone: [phone] Fax: [phone]    Your procedure is scheduled on Thurs, April 20 @ 9:30 AM  Report to [facility] Admitting at 7:30 AM  Call this number if you have problems the morning of surgery:  [phone]   Remember:  Do not eat food or drink liquids after midnight.  Take these medicines the morning of surgery with A SIP OF WATER Amlodipine(Norvasc) and Pantoprazole(Protonix)              Stop taking your Aspirin along with any Vitamins or Herbal Medications. No Goody's,BC's,Aleve,Advil,Motrin,Fish Oil,or any Herbal Medications.    Do not wear jewelry.  Do not wear lotions, powders, or colognes.  You may wear deodorant.  Men may shave face and neck.  Do not bring valuables to the hospital.  [facility] is not responsible for any belongings or valuables.  Contacts, dentures or bridgework may not be worn into surgery.  Leave your suitcase in the car.  After surgery it may be brought to your room.  For patients admitted to the hospital, discharge time will be determined by your treatment team.  Patients discharged the day of surgery will not be allowed to drive home.    Special instructions:  [facility] - Preparing for Surgery  Before surgery, you can play an important role.  Because skin is not sterile, your skin needs to be as free of germs as possible.  You can reduce the number of germs on you skin by washing with CHG (chlorahexidine gluconate) soap before surgery.  CHG is an antiseptic cleaner which kills germs and bonds with the skin to continue killing germs even after washing.  Please DO NOT use if you have an allergy to CHG or antibacterial soaps.  If your skin becomes reddened/irritated stop using the CHG and inform your nurse when you arrive at Short Stay.  Do not shave (including legs and  underarms) for at least 48 hours prior to the first CHG shower.  You may shave your face.  Please follow these instructions carefully:   1.  Shower with CHG Soap the night before surgery and the                                morning of Surgery.  2.  If you choose to wash your hair, wash your hair first as usual with your       normal shampoo.  3.  After you shampoo, rinse your hair and body thoroughly to remove the                      Shampoo.  4.  Use CHG as you would any other liquid soap.  You can apply chg directly       to the skin and wash gently with scrungie or a clean washcloth.  5.  Apply the CHG Soap to your body ONLY FROM THE NECK DOWN.        Do not use on open wounds or open sores.  Avoid contact with your eyes,       ears, mouth and genitals (private parts).  Wash genitals (private parts)       with your normal soap.  6.  Wash thoroughly,  paying special attention to the area where your surgery        will be performed.  7.  Thoroughly rinse your body with warm water from the neck down.  8.  DO NOT shower/wash with your normal soap after using and rinsing off       the CHG Soap.  9.  Pat yourself dry with a clean towel.            10.  Wear clean pajamas.            11.  Place clean sheets on your bed the night of your first shower and do not        sleep with pets.  Day of Surgery  Do not apply any lotions/deoderants the morning of surgery.  Please wear clean clothes to the hospital/surgery center.    Please read over the following fact sheets that you were given. Pain Booklet, Coughing and Deep Breathing, Blood Transfusion Information, MRSA Information and Surgical Site Infection Prevention

## 2015-06-20 LAB — HEMOGLOBIN A1C
Hgb A1c MFr Bld: 6.6 % — ABNORMAL HIGH (ref 4.8–5.6)
Mean Plasma Glucose: 143 mg/dL

## 2015-06-26 NOTE — Anesthesia Preprocedure Evaluation (Addendum)
Anesthesia Evaluation  Patient identified by MRN, date of birth, ID band Patient awake    Reviewed: Allergy & Precautions, H&P , NPO status , Patient's Chart, lab work & pertinent test results  Airway Mallampati: II   Neck ROM: Limited    Dental  (+) Edentulous Upper, Edentulous Lower   Pulmonary former smoker,    breath sounds clear to auscultation       Cardiovascular hypertension, Pt. on medications  Rhythm:Regular  ECHO 08/2014 EF 31%, Grade 2 diastolic dysfunction, normal wall motiion Stress 08/2014 low risk small irreversible defects distal   Neuro/Psych Cervical radiculopathy   Neuromuscular disease    GI/Hepatic GERD  Medicated,  Endo/Other  diabetes, Type 2, Oral Hypoglycemic Agents  Renal/GU Creat 1.49, suspect some renal insufficiency      Musculoskeletal   Abdominal   Peds  Hematology   Anesthesia Other Findings   Reproductive/Obstetrics                            Anesthesia Physical Anesthesia Plan  ASA: III  Anesthesia Plan: General   Post-op Pain Management:    Induction: Intravenous  Airway Management Planned: Oral ETT and Video Laryngoscope Planned  Additional Equipment:   Intra-op Plan:   Post-operative Plan: Extubation in OR  Informed Consent: I have reviewed the patients History and Physical, chart, labs and discussed the procedure including the risks, benefits and alternatives for the proposed anesthesia with the patient or authorized representative who has indicated his/her understanding and acceptance.     Plan Discussed with:   Anesthesia Plan Comments: (Glide to limit neck motion, Creat. 1.49 )        Anesthesia Quick Evaluation

## 2015-06-27 ENCOUNTER — Inpatient Hospital Stay (HOSPITAL_COMMUNITY): Payer: Medicare Other

## 2015-06-27 ENCOUNTER — Encounter (HOSPITAL_COMMUNITY)
Admission: RE | Disposition: A | Discharge status: Home / Self Care | Payer: Self-pay | Source: Ambulatory Visit | Attending: Neurological Surgery

## 2015-06-27 ENCOUNTER — Inpatient Hospital Stay (HOSPITAL_COMMUNITY): Payer: Medicare Other | Admitting: Anesthesiology

## 2015-06-27 ENCOUNTER — Inpatient Hospital Stay (HOSPITAL_COMMUNITY)
Admission: RE | Admit: 2015-06-27 | Discharge: 2015-06-28 | DRG: 473 | Disposition: A | Discharge status: Home / Self Care | Payer: Medicare Other | Source: Ambulatory Visit | Attending: Neurological Surgery | Admitting: Neurological Surgery

## 2015-06-27 ENCOUNTER — Encounter (HOSPITAL_COMMUNITY): Payer: Self-pay | Admitting: *Deleted

## 2015-06-27 DIAGNOSIS — Z981 Arthrodesis status: Secondary | ICD-10-CM

## 2015-06-27 DIAGNOSIS — E785 Hyperlipidemia, unspecified: Secondary | ICD-10-CM | POA: Diagnosis present

## 2015-06-27 DIAGNOSIS — M47812 Spondylosis without myelopathy or radiculopathy, cervical region: Secondary | ICD-10-CM | POA: Diagnosis not present

## 2015-06-27 DIAGNOSIS — Z96652 Presence of left artificial knee joint: Secondary | ICD-10-CM | POA: Diagnosis present

## 2015-06-27 DIAGNOSIS — Z833 Family history of diabetes mellitus: Secondary | ICD-10-CM | POA: Diagnosis not present

## 2015-06-27 DIAGNOSIS — K219 Gastro-esophageal reflux disease without esophagitis: Secondary | ICD-10-CM | POA: Diagnosis present

## 2015-06-27 DIAGNOSIS — M542 Cervicalgia: Secondary | ICD-10-CM | POA: Diagnosis present

## 2015-06-27 DIAGNOSIS — I10 Essential (primary) hypertension: Secondary | ICD-10-CM | POA: Diagnosis present

## 2015-06-27 DIAGNOSIS — Z7984 Long term (current) use of oral hypoglycemic drugs: Secondary | ICD-10-CM | POA: Diagnosis not present

## 2015-06-27 DIAGNOSIS — M4802 Spinal stenosis, cervical region: Secondary | ICD-10-CM | POA: Diagnosis not present

## 2015-06-27 DIAGNOSIS — E1142 Type 2 diabetes mellitus with diabetic polyneuropathy: Secondary | ICD-10-CM | POA: Diagnosis present

## 2015-06-27 DIAGNOSIS — E559 Vitamin D deficiency, unspecified: Secondary | ICD-10-CM | POA: Diagnosis not present

## 2015-06-27 DIAGNOSIS — Z7982 Long term (current) use of aspirin: Secondary | ICD-10-CM | POA: Diagnosis not present

## 2015-06-27 DIAGNOSIS — Z87891 Personal history of nicotine dependence: Secondary | ICD-10-CM

## 2015-06-27 DIAGNOSIS — M1990 Unspecified osteoarthritis, unspecified site: Secondary | ICD-10-CM | POA: Diagnosis not present

## 2015-06-27 DIAGNOSIS — M4322 Fusion of spine, cervical region: Secondary | ICD-10-CM | POA: Diagnosis not present

## 2015-06-27 DIAGNOSIS — Z419 Encounter for procedure for purposes other than remedying health state, unspecified: Secondary | ICD-10-CM

## 2015-06-27 DIAGNOSIS — Z79899 Other long term (current) drug therapy: Secondary | ICD-10-CM

## 2015-06-27 HISTORY — PX: POSTERIOR CERVICAL FUSION/FORAMINOTOMY: SHX5038

## 2015-06-27 LAB — GLUCOSE, CAPILLARY
Glucose-Capillary: 99 mg/dL (ref 65–99)
Glucose-Capillary: 120 mg/dL — ABNORMAL HIGH (ref 65–99)
Glucose-Capillary: 191 mg/dL — ABNORMAL HIGH (ref 65–99)
Glucose-Capillary: 201 mg/dL — ABNORMAL HIGH (ref 65–99)

## 2015-06-27 SURGERY — POSTERIOR CERVICAL FUSION/FORAMINOTOMY LEVEL 4
Anesthesia: General

## 2015-06-27 MED ORDER — THROMBIN 20000 UNITS EX SOLR
CUTANEOUS | Status: DC | PRN
  Administered 2015-06-27: 09:00:00 via TOPICAL

## 2015-06-27 MED ORDER — EPHEDRINE SULFATE 50 MG/ML IJ SOLN
INTRAMUSCULAR | Status: AC
  Filled 2015-06-27: qty 1

## 2015-06-27 MED ORDER — MORPHINE SULFATE (PF) 2 MG/ML IV SOLN
1.0000 mg | INTRAVENOUS | Status: DC | PRN
Start: 2015-06-27 — End: 2015-06-28
  Administered 2015-06-27 (×2): 4 mg via INTRAVENOUS
  Filled 2015-06-27 (×2): qty 2

## 2015-06-27 MED ORDER — POTASSIUM CHLORIDE IN NACL 20-0.9 MEQ/L-% IV SOLN
INTRAVENOUS | Status: DC
  Filled 2015-06-27 (×3): qty 1000

## 2015-06-27 MED ORDER — METFORMIN HCL 500 MG PO TABS
500.0000 mg | ORAL_TABLET | Freq: Every day | ORAL | Status: DC
  Administered 2015-06-28: 500 mg via ORAL
  Filled 2015-06-27: qty 1

## 2015-06-27 MED ORDER — PHENOL 1.4 % MT LIQD
1.0000 | OROMUCOSAL | Status: DC | PRN
  Administered 2015-06-28: 1 via OROMUCOSAL
  Filled 2015-06-27: qty 177

## 2015-06-27 MED ORDER — ARTIFICIAL TEARS OP OINT
TOPICAL_OINTMENT | OPHTHALMIC | Status: AC
  Filled 2015-06-27: qty 3.5

## 2015-06-27 MED ORDER — SODIUM CHLORIDE 0.9 % IV SOLN: 250.0000 mL | INTRAVENOUS | Status: DC

## 2015-06-27 MED ORDER — FENTANYL CITRATE (PF) 100 MCG/2ML IJ SOLN
25.0000 ug | INTRAMUSCULAR | Status: DC | PRN
  Administered 2015-06-27 (×4): 25 ug via INTRAVENOUS

## 2015-06-27 MED ORDER — FENTANYL CITRATE (PF) 250 MCG/5ML IJ SOLN
INTRAMUSCULAR | Status: AC
  Filled 2015-06-27: qty 5

## 2015-06-27 MED ORDER — SODIUM CHLORIDE 0.9 % IR SOLN
Status: DC | PRN
  Administered 2015-06-27: 09:00:00

## 2015-06-27 MED ORDER — PROPOFOL 10 MG/ML IV BOLUS
INTRAVENOUS | Status: DC | PRN
  Administered 2015-06-27: 170 mg via INTRAVENOUS

## 2015-06-27 MED ORDER — LIDOCAINE HCL (CARDIAC) 20 MG/ML IV SOLN
INTRAVENOUS | Status: DC | PRN
  Administered 2015-06-27: 60 mg via INTRAVENOUS
  Administered 2015-06-27: 40 mg via INTRAVENOUS

## 2015-06-27 MED ORDER — SODIUM CHLORIDE 0.9 % IJ SOLN
INTRAMUSCULAR | Status: AC
  Filled 2015-06-27: qty 10

## 2015-06-27 MED ORDER — OXYCODONE-ACETAMINOPHEN 5-325 MG PO TABS
ORAL_TABLET | ORAL | Status: AC
  Filled 2015-06-27: qty 2

## 2015-06-27 MED ORDER — THROMBIN 5000 UNITS EX SOLR
CUTANEOUS | Status: DC | PRN
  Administered 2015-06-27: 09:00:00 via TOPICAL

## 2015-06-27 MED ORDER — SODIUM CHLORIDE 0.9% FLUSH
3.0000 mL | Freq: Two times a day (BID) | INTRAVENOUS | Status: DC
  Administered 2015-06-27: 3 mL via INTRAVENOUS

## 2015-06-27 MED ORDER — ACETAMINOPHEN 325 MG PO TABS: 650.0000 mg | ORAL_TABLET | ORAL | Status: DC | PRN

## 2015-06-27 MED ORDER — 0.9 % SODIUM CHLORIDE (POUR BTL) OPTIME
TOPICAL | Status: DC | PRN
  Administered 2015-06-27: 1000 mL

## 2015-06-27 MED ORDER — DEXAMETHASONE SODIUM PHOSPHATE 10 MG/ML IJ SOLN
INTRAMUSCULAR | Status: AC
  Administered 2015-06-27: 10 mg via INTRAVENOUS
  Filled 2015-06-27: qty 1

## 2015-06-27 MED ORDER — FENTANYL CITRATE (PF) 100 MCG/2ML IJ SOLN
INTRAMUSCULAR | Status: DC | PRN
  Administered 2015-06-27: 100 ug via INTRAVENOUS
  Administered 2015-06-27 (×8): 50 ug via INTRAVENOUS

## 2015-06-27 MED ORDER — PANTOPRAZOLE SODIUM 40 MG PO TBEC: 40.0000 mg | DELAYED_RELEASE_TABLET | Freq: Every day | ORAL | Status: DC

## 2015-06-27 MED ORDER — SUGAMMADEX SODIUM 200 MG/2ML IV SOLN
INTRAVENOUS | Status: DC | PRN
  Administered 2015-06-27: 200 mg via INTRAVENOUS

## 2015-06-27 MED ORDER — ROCURONIUM BROMIDE 100 MG/10ML IV SOLN
INTRAVENOUS | Status: DC | PRN
  Administered 2015-06-27: 10 mg via INTRAVENOUS
  Administered 2015-06-27: 40 mg via INTRAVENOUS
  Administered 2015-06-27: 10 mg via INTRAVENOUS

## 2015-06-27 MED ORDER — SODIUM CHLORIDE 0.9% FLUSH: 3.0000 mL | INTRAVENOUS | Status: DC | PRN

## 2015-06-27 MED ORDER — CEFAZOLIN SODIUM 1-5 GM-% IV SOLN
1.0000 g | Freq: Three times a day (TID) | INTRAVENOUS | Status: AC
  Administered 2015-06-27 – 2015-06-28 (×2): 1 g via INTRAVENOUS
  Filled 2015-06-27 (×2): qty 50

## 2015-06-27 MED ORDER — VANCOMYCIN HCL 1000 MG IV SOLR
INTRAVENOUS | Status: DC | PRN
  Administered 2015-06-27: 1000 mg

## 2015-06-27 MED ORDER — CEFAZOLIN SODIUM-DEXTROSE 2-4 GM/100ML-% IV SOLN
2.0000 g | INTRAVENOUS | Status: AC
  Administered 2015-06-27: 2 g via INTRAVENOUS
  Filled 2015-06-27: qty 100

## 2015-06-27 MED ORDER — ONDANSETRON HCL 4 MG/2ML IJ SOLN
INTRAMUSCULAR | Status: DC | PRN
  Administered 2015-06-27: 4 mg via INTRAVENOUS

## 2015-06-27 MED ORDER — ONDANSETRON HCL 4 MG/2ML IJ SOLN
INTRAMUSCULAR | Status: AC
  Filled 2015-06-27: qty 2

## 2015-06-27 MED ORDER — LOSARTAN POTASSIUM 50 MG PO TABS
100.0000 mg | ORAL_TABLET | Freq: Every day | ORAL | Status: DC
  Administered 2015-06-27: 100 mg via ORAL
  Filled 2015-06-27: qty 2

## 2015-06-27 MED ORDER — METHOCARBAMOL 500 MG PO TABS
ORAL_TABLET | ORAL | Status: AC
  Filled 2015-06-27: qty 1

## 2015-06-27 MED ORDER — ONDANSETRON HCL 4 MG/2ML IJ SOLN: 4.0000 mg | Freq: Once | INTRAMUSCULAR | Status: DC

## 2015-06-27 MED ORDER — LIDOCAINE HCL (CARDIAC) 20 MG/ML IV SOLN
INTRAVENOUS | Status: AC
  Filled 2015-06-27: qty 5

## 2015-06-27 MED ORDER — NEOSTIGMINE METHYLSULFATE 10 MG/10ML IV SOLN
INTRAVENOUS | Status: AC
  Filled 2015-06-27: qty 1

## 2015-06-27 MED ORDER — ROCURONIUM BROMIDE 50 MG/5ML IV SOLN
INTRAVENOUS | Status: AC
  Filled 2015-06-27: qty 2

## 2015-06-27 MED ORDER — MEPERIDINE HCL 25 MG/ML IJ SOLN: 6.2500 mg | INTRAMUSCULAR | Status: DC | PRN

## 2015-06-27 MED ORDER — ACETAMINOPHEN 10 MG/ML IV SOLN
INTRAVENOUS | Status: AC
  Filled 2015-06-27: qty 100

## 2015-06-27 MED ORDER — GLIMEPIRIDE 1 MG PO TABS
1.0000 mg | ORAL_TABLET | Freq: Every day | ORAL | Status: DC
  Administered 2015-06-28: 1 mg via ORAL
  Filled 2015-06-27: qty 1

## 2015-06-27 MED ORDER — VANCOMYCIN HCL 1000 MG IV SOLR
INTRAVENOUS | Status: AC
  Filled 2015-06-27: qty 1000

## 2015-06-27 MED ORDER — METHOCARBAMOL 500 MG PO TABS
500.0000 mg | ORAL_TABLET | Freq: Four times a day (QID) | ORAL | Status: DC | PRN
  Administered 2015-06-27 – 2015-06-28 (×4): 500 mg via ORAL
  Filled 2015-06-27 (×3): qty 1

## 2015-06-27 MED ORDER — PHENYLEPHRINE HCL 10 MG/ML IJ SOLN
10.0000 mg | INTRAMUSCULAR | Status: DC | PRN
  Administered 2015-06-27: 20 ug/min via INTRAVENOUS

## 2015-06-27 MED ORDER — BUPIVACAINE HCL (PF) 0.25 % IJ SOLN
INTRAMUSCULAR | Status: DC | PRN
  Administered 2015-06-27: 7 mL

## 2015-06-27 MED ORDER — ACETAMINOPHEN 650 MG RE SUPP: 650.0000 mg | RECTAL | Status: DC | PRN

## 2015-06-27 MED ORDER — LIDOCAINE HCL 4 % EX SOLN
CUTANEOUS | Status: DC | PRN
  Administered 2015-06-27: 1 mL via TOPICAL

## 2015-06-27 MED ORDER — METHOCARBAMOL 1000 MG/10ML IJ SOLN
500.0000 mg | Freq: Four times a day (QID) | INTRAVENOUS | Status: DC | PRN
  Filled 2015-06-27: qty 5

## 2015-06-27 MED ORDER — EPHEDRINE SULFATE 50 MG/ML IJ SOLN
INTRAMUSCULAR | Status: DC | PRN
  Administered 2015-06-27: 10 mg via INTRAVENOUS

## 2015-06-27 MED ORDER — PROPOFOL 10 MG/ML IV BOLUS
INTRAVENOUS | Status: AC
  Filled 2015-06-27: qty 40

## 2015-06-27 MED ORDER — LOSARTAN POTASSIUM-HCTZ 100-25 MG PO TABS: 1.0000 | ORAL_TABLET | Freq: Every day | ORAL | Status: DC

## 2015-06-27 MED ORDER — ONDANSETRON HCL 4 MG/2ML IJ SOLN: 4.0000 mg | INTRAMUSCULAR | Status: DC | PRN

## 2015-06-27 MED ORDER — FENTANYL CITRATE (PF) 100 MCG/2ML IJ SOLN
INTRAMUSCULAR | Status: AC
  Filled 2015-06-27: qty 2

## 2015-06-27 MED ORDER — LACTATED RINGERS IV SOLN
INTRAVENOUS | Status: DC
  Administered 2015-06-27 (×3): via INTRAVENOUS

## 2015-06-27 MED ORDER — SUCCINYLCHOLINE CHLORIDE 20 MG/ML IJ SOLN
INTRAMUSCULAR | Status: AC
  Filled 2015-06-27: qty 1

## 2015-06-27 MED ORDER — AMLODIPINE BESYLATE 5 MG PO TABS
5.0000 mg | ORAL_TABLET | Freq: Every day | ORAL | Status: DC
  Filled 2015-06-27: qty 1

## 2015-06-27 MED ORDER — GLYCOPYRROLATE 0.2 MG/ML IJ SOLN
INTRAMUSCULAR | Status: AC
  Filled 2015-06-27: qty 2

## 2015-06-27 MED ORDER — MENTHOL 3 MG MT LOZG
1.0000 | LOZENGE | OROMUCOSAL | Status: DC | PRN
Start: 2015-06-27 — End: 2015-06-28
  Filled 2015-06-27: qty 9

## 2015-06-27 MED ORDER — PHENYLEPHRINE HCL 10 MG/ML IJ SOLN
INTRAMUSCULAR | Status: AC
  Filled 2015-06-27: qty 1

## 2015-06-27 MED ORDER — ARTIFICIAL TEARS OP OINT
TOPICAL_OINTMENT | OPHTHALMIC | Status: DC | PRN
  Administered 2015-06-27: 1 via OPHTHALMIC

## 2015-06-27 MED ORDER — HYDROCHLOROTHIAZIDE 25 MG PO TABS
25.0000 mg | ORAL_TABLET | Freq: Every day | ORAL | Status: DC
  Administered 2015-06-27: 25 mg via ORAL
  Filled 2015-06-27: qty 1

## 2015-06-27 MED ORDER — OXYCODONE-ACETAMINOPHEN 5-325 MG PO TABS
1.0000 | ORAL_TABLET | ORAL | Status: DC | PRN
  Administered 2015-06-27 – 2015-06-28 (×4): 2 via ORAL
  Filled 2015-06-27 (×3): qty 2

## 2015-06-27 SURGICAL SUPPLY — 59 items
BAG DECANTER FOR FLEXI CONT (MISCELLANEOUS) ×2 IMPLANT
BENZOIN TINCTURE PRP APPL 2/3 (GAUZE/BANDAGES/DRESSINGS) ×2 IMPLANT
BIT DRILL VUEPOINT II (BIT) ×1 IMPLANT
BLADE CLIPPER SURG (BLADE) IMPLANT
BONE MATRIX OSTEOCEL PRO MED (Bone Implant) ×2 IMPLANT
BUR MATCHSTICK NEURO 3.0 LAGG (BURR) ×4 IMPLANT
CANISTER SUCT 3000ML PPV (MISCELLANEOUS) ×2 IMPLANT
DRAPE C-ARM 42X72 X-RAY (DRAPES) ×4 IMPLANT
DRAPE LAPAROTOMY 100X72 PEDS (DRAPES) ×2 IMPLANT
DRAPE POUCH INSTRU U-SHP 10X18 (DRAPES) ×2 IMPLANT
DRILL BIT VUEPOINT II (BIT) ×1
DRSG OPSITE POSTOP 4X6 (GAUZE/BANDAGES/DRESSINGS) ×2 IMPLANT
DURAPREP 6ML APPLICATOR 50/CS (WOUND CARE) ×2 IMPLANT
ELECT REM PT RETURN 9FT ADLT (ELECTROSURGICAL) ×2
ELECTRODE REM PT RTRN 9FT ADLT (ELECTROSURGICAL) ×1 IMPLANT
EVACUATOR 1/8 PVC DRAIN (DRAIN) IMPLANT
GAUZE SPONGE 4X4 12PLY STRL (GAUZE/BANDAGES/DRESSINGS) ×2 IMPLANT
GAUZE SPONGE 4X4 16PLY XRAY LF (GAUZE/BANDAGES/DRESSINGS) IMPLANT
GLOVE BIO SURGEON STRL SZ8 (GLOVE) ×2 IMPLANT
GLOVE BIOGEL PI IND STRL 6.5 (GLOVE) ×3 IMPLANT
GLOVE BIOGEL PI INDICATOR 6.5 (GLOVE) ×3
GLOVE EXAM NITRILE LRG STRL (GLOVE) IMPLANT
GLOVE EXAM NITRILE MD LF STRL (GLOVE) IMPLANT
GLOVE EXAM NITRILE XL STR (GLOVE) IMPLANT
GLOVE EXAM NITRILE XS STR PU (GLOVE) IMPLANT
GLOVE INDICATOR 6.5 STRL GRN (GLOVE) ×2 IMPLANT
GLOVE INDICATOR 7.5 STRL GRN (GLOVE) ×4 IMPLANT
GLOVE SS N UNI LF 7.0 STRL (GLOVE) ×2 IMPLANT
GOWN STRL REUS W/ TWL LRG LVL3 (GOWN DISPOSABLE) ×1 IMPLANT
GOWN STRL REUS W/ TWL XL LVL3 (GOWN DISPOSABLE) ×2 IMPLANT
GOWN STRL REUS W/TWL 2XL LVL3 (GOWN DISPOSABLE) IMPLANT
GOWN STRL REUS W/TWL LRG LVL3 (GOWN DISPOSABLE) ×1
GOWN STRL REUS W/TWL XL LVL3 (GOWN DISPOSABLE) ×2
HEMOSTAT POWDER KIT SURGIFOAM (HEMOSTASIS) ×2 IMPLANT
KIT BASIN OR (CUSTOM PROCEDURE TRAY) ×2 IMPLANT
KIT ROOM TURNOVER OR (KITS) ×2 IMPLANT
MARKER SKIN DUAL TIP RULER LAB (MISCELLANEOUS) ×2 IMPLANT
NEEDLE HYPO 18GX1.5 BLUNT FILL (NEEDLE) IMPLANT
NEEDLE HYPO 25X1 1.5 SAFETY (NEEDLE) ×2 IMPLANT
NEEDLE SPNL 20GX3.5 QUINCKE YW (NEEDLE) ×2 IMPLANT
NS IRRIG 1000ML POUR BTL (IV SOLUTION) ×2 IMPLANT
PACK LAMINECTOMY NEURO (CUSTOM PROCEDURE TRAY) ×2 IMPLANT
PIN MAYFIELD SKULL DISP (PIN) ×2 IMPLANT
ROD 120MM (Rod) ×1 IMPLANT
ROD SPNL 120X3.5XNS LF TI (Rod) ×1 IMPLANT
SCREW MA MM 3.5X14 (Screw) ×16 IMPLANT
SCREW SET THREADED (Screw) ×16 IMPLANT
SPONGE SURGIFOAM ABS GEL 100 (HEMOSTASIS) ×2 IMPLANT
STRIP CLOSURE SKIN 1/2X4 (GAUZE/BANDAGES/DRESSINGS) ×2 IMPLANT
SUT VIC AB 0 CT1 18XCR BRD8 (SUTURE) ×1 IMPLANT
SUT VIC AB 0 CT1 8-18 (SUTURE) ×1
SUT VIC AB 2-0 CP2 18 (SUTURE) ×2 IMPLANT
SUT VIC AB 3-0 SH 8-18 (SUTURE) ×4 IMPLANT
TOWEL OR 17X24 6PK STRL BLUE (TOWEL DISPOSABLE) ×2 IMPLANT
TOWEL OR 17X26 10 PK STRL BLUE (TOWEL DISPOSABLE) ×2 IMPLANT
TRAY FOLEY W/METER SILVER 14FR (SET/KITS/TRAYS/PACK) IMPLANT
TRAY FOLEY W/METER SILVER 16FR (SET/KITS/TRAYS/PACK) IMPLANT
UNDERPAD 30X30 INCONTINENT (UNDERPADS AND DIAPERS) ×2 IMPLANT
WATER STERILE IRR 1000ML POUR (IV SOLUTION) ×2 IMPLANT

## 2015-06-27 NOTE — Transfer of Care (Signed)
Immediate Anesthesia Transfer of Care Note  Patient: Nicholas Patton  Procedure(s) Performed: Procedure(s): Posterior Cervical Fusion with lateral mass fixation Cervical four to cervical seven (N/A)  Patient Location: PACU  Anesthesia Type:General  Level of Consciousness: awake, alert  and patient cooperative  Airway & Oxygen Therapy: Patient Spontanous Breathing and Patient connected to nasal cannula oxygen  Post-op Assessment: Report given to RN and Post -op Vital signs reviewed and stable  Post vital signs: Reviewed and stable  Last Vitals:  Filed Vitals:   06/27/15 0738  BP: 151/65  Pulse: 71  Temp: 36.6 C  Resp: 20    Complications: No apparent anesthesia complications

## 2015-06-27 NOTE — H&P (Signed)
Subjective:   Patient is a 81 y.o. male admitted for neck pain. The patient first presented to me with complaints of neck pain. Onset of symptoms was several years ago. The pain is described as aching and occurs all day. The pain is rated severe, and is located in the neck. The symptoms have been progressive. Symptoms are exacerbated by extending head backwards, and are relieved by none.  Previous work up includes MRI of cervical spine, results: spondylosis, DDD.  Past Medical History  Diagnosis Date  . HYPERLIPIDEMIA     takes Atorvastatin daily  . PERIPHERAL NEUROPATHY, FEET 10/06/2007  . VITAMIN B12 DEFICIENCY 09/06/2008  . VITAMIN D DEFICIENCY 01/16/2010    takes Vitamin D daily  . Ileus following gastrointestinal surgery 07/22/2012  . Peripheral neuropathy ([place])   . Arthritis   . GERD (gastroesophageal reflux disease)     takes Protonix daily  . HYPERTENSION     takes Amlodipine and Hyzaar daily  . DIABETES MELLITUS, TYPE II     takes Metformin and Glimepiride daily  . Peripheral neuropathy ([place])   . Joint pain   . History of colon polyps     Past Surgical History  Procedure Laterality Date  . Total knee arthroplasty  2001    Left  . Turp vaporization    . Cholecystectomy N/A 07/18/2012    Procedure: LAPAROSCOPIC CHOLECYSTECTOMY WITH INTRAOPERATIVE CHOLANGIOGRAM;  Surgeon: Zenovia Jarred, MD;  Location: [facility];  Service: General;  Laterality: N/A;  . Cataract extraction      both eyes  . Colonoscopy      No Known Allergies  Social History  Substance Use Topics  . Smoking status: Former Smoker -- 0.00 packs/day    Types: Cigarettes  . Smokeless tobacco: Never Used     Comment: quit smoking around 1990  . Alcohol Use: No    Family History  Problem Relation Age of Onset  . Lung cancer Brother   . Diabetes Mellitus II Mother   . Colon cancer Neg Hx   . Esophageal cancer Neg Hx   . Rectal cancer Neg Hx   . Stomach cancer Neg Hx    Prior to Admission medications    Medication Sig Start Date End Date Taking? Authorizing Provider  amLODipine (NORVASC) 5 MG tablet Take 1 tablet by mouth  daily 04/15/15  Yes [name], MD  aspirin 81 MG EC tablet Take 81 mg by mouth daily.     Yes Historical Provider, MD  atorvastatin (LIPITOR) 40 MG tablet Take 1 tablet by mouth  daily 04/15/15  Yes [name], MD  glimepiride (AMARYL) 1 MG tablet Take one-half tablet by  mouth two times daily  before meals 04/15/15  Yes [name], MD  losartan-hydrochlorothiazide [facility]) 100-25 MG tablet Take 1 tablet by mouth  daily 04/15/15  Yes [name], MD  metFORMIN (GLUCOPHAGE) 500 MG tablet Take 2 tablets by mouth  twice a day with meals 04/15/15  Yes [name], MD  Multiple Vitamins-Minerals (MULTIVITAMIN PO) Take 1 tablet by mouth daily.   Yes Historical Provider, MD  pantoprazole (PROTONIX) 40 MG tablet Take 1 tablet by mouth  daily 04/15/15  Yes [name], MD  cetirizine (ZYRTEC) 10 MG tablet Take 1 tablet (10 mg total) by mouth daily. Patient not taking: Reported on 06/17/2015 04/09/15   [name], MD  Cholecalciferol (VITAMIN D3) 1000 UNITS CAPS Take 1 capsule (1,000 Units total) by mouth daily. Patient not  taking: Reported on 06/17/2015 03/22/13   [name], MD  fesoterodine (TOVIAZ) 4 MG TB24 tablet Take 1 tablet (4 mg total) by mouth daily. Patient not taking: Reported on 06/17/2015 05/02/15   [name], MD  sildenafil (VIAGRA) 100 MG tablet Take 0.5-1 tablets (50-100 mg total) by mouth daily as needed for erectile dysfunction. Patient not taking: Reported on 06/17/2015 04/09/15   [name], MD     Review of Systems  Positive ROS: neg  All other systems have been reviewed and were otherwise negative with the exception of those mentioned in the HPI and as above.  Objective: Vital signs in last 24 hours: Temp:  [97.8 F (36.6 C)] 97.8 F (36.6 C) (04/20 0738) Pulse Rate:  [71] 71 (04/20 0738) Resp:  [20] 20 (04/20 0738) BP: (151)/(65) 151/65 mmHg (04/20  0738) SpO2:  [98 %] 98 % (04/20 0738) Weight:  [83.462 kg (184 lb)] 83.462 kg (184 lb) (04/20 0738)  General Appearance: Alert, cooperative, no distress, appears stated age Head: Normocephalic, without obvious abnormality, atraumatic Eyes: PERRL, conjunctiva/corneas clear, EOM's intact      Neck: Supple, symmetrical, trachea midline, Back: Symmetric, no curvature, ROM normal, no CVA tenderness Lungs:  respirations unlabored Heart: Regular rate and rhythm Abdomen: Soft, non-tender Extremities: Extremities normal, atraumatic, no cyanosis or edema Pulses: 2+ and symmetric all extremities Skin: Skin color, texture, turgor normal, no rashes or lesions  NEUROLOGIC:  Mental status: Alert and oriented x4, no aphasia, good attention span, fund of knowledge and memory  Motor Exam - grossly normal Sensory Exam - grossly normal Reflexes: 1+ Coordination - grossly normal Gait - grossly normal Balance - grossly normal Cranial Nerves: I: smell Not tested  II: visual acuity  OS: nl    OD: nl  II: visual fields Full to confrontation  II: pupils Equal, round, reactive to light  III,VII: ptosis None  III,IV,VI: extraocular muscles  Full ROM  V: mastication Normal  V: facial light touch sensation  Normal  V,VII: corneal reflex  Present  VII: facial muscle function - upper  Normal  VII: facial muscle function - lower Normal  VIII: hearing Not tested  IX: soft palate elevation  Normal  IX,X: gag reflex Present  XI: trapezius strength  5/5  XI: sternocleidomastoid strength 5/5  XI: neck flexion strength  5/5  XII: tongue strength  Normal    Data Review Lab Results  Component Value Date   WBC 3.5* 06/19/2015   HGB 12.6* 06/19/2015   HCT 39.3 06/19/2015   MCV 84.9 06/19/2015   PLT 205 06/19/2015   Lab Results  Component Value Date   NA 141 06/19/2015   K 4.3 06/19/2015   CL 104 06/19/2015   CO2 25 06/19/2015   BUN 23* 06/19/2015   CREATININE 1.49* 06/19/2015   GLUCOSE 103*  06/19/2015   Lab Results  Component Value Date   INR 1.12 06/19/2015    Assessment:   Cervical neck pain with herniated nucleus pulposus/ spondylosis/ stenosis at C4-T1. Patient has failed conservative therapy. Planned surgery : PCF C4-T1  Plan:   I explained the condition and procedure to the patient and answered any questions.  Patient wishes to proceed with procedure as planned. Understands risks/ benefits/ and expected or typical outcomes.  Ravinder Lukehart S 06/27/2015 8:51 AM

## 2015-06-27 NOTE — Anesthesia Postprocedure Evaluation (Signed)
Anesthesia Post Note  Patient: Nicholas Patton  Procedure(s) Performed: Procedure(s) (LRB): Posterior Cervical Fusion with lateral mass fixation Cervical four to cervical seven (N/A)  Patient location during evaluation: PACU Anesthesia Type: General Level of consciousness: awake and alert Pain management: pain level controlled Vital Signs Assessment: post-procedure vital signs reviewed and stable Respiratory status: spontaneous breathing, nonlabored ventilation, respiratory function stable and patient connected to nasal cannula oxygen Cardiovascular status: blood pressure returned to baseline and stable Postop Assessment: no signs of nausea or vomiting Anesthetic complications: no    Last Vitals:  Filed Vitals:   06/27/15 1215 06/27/15 1230  BP: 159/62 155/61  Pulse: 50 51  Temp:    Resp: 23 17    Last Pain:  Filed Vitals:   06/27/15 1247  PainSc: 10-Worst pain ever                 Alexis Frock

## 2015-06-27 NOTE — Op Note (Signed)
06/27/2015  11:24 AM  PATIENT:  Nicholas Patton  81 y.o. male  PRE-OPERATIVE DIAGNOSIS:  Cervical spondylosis with neck pain  POST-OPERATIVE DIAGNOSIS:  same  PROCEDURE:  Posterior cervical arthrodesis C4-C7 utilizing morcellized allograft followed by segmental cementation C4-C7 utilizing nuvasive lateral mass screws  SURGEON:  Sherley Bounds, MD  ASSISTANTS: Dr Cyndy Freeze  ANESTHESIA:   General  EBL: 100 ml  Total I/O In: 1000 [I.V.:1000] Out: 100 [Blood:100]  BLOOD ADMINISTERED:none  DRAINS: none   SPECIMEN:  No Specimen  INDICATION FOR PROCEDURE: The patient presented with severe neck pain. The pain was chronic and debilitating. MRI showed autofusion C2-C4 with adjacent level spondylosis and degenerative disc disease C4-C7. He tried conservative measures without relief. Patient understood the risks, benefits, and alternatives and potential outcomes and wished to proceed.  PROCEDURE DETAILS: The patient was brought to the operating room. Generalized endotracheal anesthesia was induced. The patient was affixed a 3 point [place] headrest and rolled into the prone position on chest rolls. All pressure points were padded. The posterior cervical region was cleaned and prepped with DuraPrep and then draped in the usual sterile fashion. 7 cc of local anesthesia was injected and a dorsal midline incision made in the posterior cervical region and carried down to the cervical fascia. The fascia was opened and the paraspinous musculature was taken down to expose C4-T1. Intraoperative fluoroscopy confirmed my level and then the dissection was carried out over the lateral facets. I localized the midpoint of each lateral mass and marked a region 1 mm medial to the midpoint of the lateral mass, and then drilled in an upward and outward direction into the safe zone of each lateral mass. I drilled to a depth of 14 mm and then checked my drill hole with a ball probe. I then placed a 14 mm lateral mass screws  into the safe zone of each lateral mass until they were 2 fingers tight. . I then decorticated the lateral masses and the facet joints and packed them with morcellized allograft to perform arthrodesis from C4-C7. I then placed rods into the multiaxial screw heads of the screws and locked these into position with the locking caps and anti-torque device. I then checked the final construct with AP/Lat fluoroscopy. I irrigated with saline solution containing bacitracin. After hemostasis was achieved I closed the muscle and the fascia with 0 Vicryl, subcutaneous tissue with 2-0 Vicryl, and the subcuticular tissue with 3-0 Vicryl. The skin was closed with benzoin and Steri-Strips. A sterile dressing was applied, the patient was turned to the supine position and taken out of the headrest, awakened from general anesthesia and transferred to the recovery room in stable condition. At the end of the procedure all sponge, needle and instrument counts were correct.   PLAN OF CARE: Admit to inpatient   PATIENT DISPOSITION:  PACU - hemodynamically stable.   Delay start of Pharmacological VTE agent (>24hrs) due to surgical blood loss or risk of bleeding:  yes

## 2015-06-27 NOTE — Anesthesia Procedure Notes (Signed)
Procedure Name: Intubation Date/Time: 06/27/2015 9:24 AM Performed by: Scheryl Darter Pre-anesthesia Checklist: Patient identified Patient Re-evaluated:Patient Re-evaluated prior to inductionOxygen Delivery Method: Circle System Utilized Preoxygenation: Pre-oxygenation with 100% oxygen Intubation Type: IV induction Ventilation: Mask ventilation without difficulty Laryngoscope Size: Mac and 4 Grade View: Grade I Tube type: Oral Tube size: 7.5 mm Number of attempts: 1 Airway Equipment and Method: Stylet and Oral airway Placement Confirmation: ETT inserted through vocal cords under direct vision,  positive ETCO2 and breath sounds checked- equal and bilateral Secured at: 23 cm Tube secured with: Tape Dental Injury: Teeth and Oropharynx as per pre-operative assessment

## 2015-06-28 ENCOUNTER — Encounter (HOSPITAL_COMMUNITY): Payer: Self-pay | Admitting: Neurological Surgery

## 2015-06-28 LAB — GLUCOSE, CAPILLARY: Glucose-Capillary: 128 mg/dL — ABNORMAL HIGH (ref 65–99)

## 2015-06-28 MED ORDER — METHOCARBAMOL 500 MG PO TABS: 500.0000 mg | ORAL_TABLET | Freq: Four times a day (QID) | ORAL | Status: DC | PRN

## 2015-06-28 MED ORDER — OXYCODONE-ACETAMINOPHEN 5-325 MG PO TABS
1.0000 | ORAL_TABLET | ORAL | Status: DC | PRN
Start: 2015-06-28 — End: 2016-02-26

## 2015-06-28 NOTE — Discharge Summary (Signed)
Physician Discharge Summary  Patient ID: Nicholas Patton MRN: [id number] DOB/AGE: [birth year] 81 y.o.  Admit date: 06/27/2015 Discharge date: 06/28/2015  Admission Diagnoses: Cervical spondylosis    Discharge Diagnoses: Same   Discharged Condition: good  Hospital Course: The patient was admitted on 06/27/2015 and taken to the operating room where the patient underwent posterior cervical fusion. The patient tolerated the procedure well and was taken to the recovery room and then to the floor in stable condition. The hospital course was routine. There were no complications. The wound remained clean dry and intact. Pt had appropriate neck soreness. No complaints of arm pain or new N/T/W. The patient remained afebrile with stable vital signs, and tolerated a regular diet. The patient continued to increase activities, and pain was well controlled with oral pain medications.   Consults: None  Significant Diagnostic Studies:  Results for orders placed or performed during the hospital encounter of 06/27/15  Glucose, capillary  Result Value Ref Range   Glucose-Capillary 99 65 - 99 mg/dL   Comment 1 Notify RN    Comment 2 Document in Chart   Glucose, capillary  Result Value Ref Range   Glucose-Capillary 120 (H) 65 - 99 mg/dL   Comment 1 Notify RN    Comment 2 Document in Chart   Glucose, capillary  Result Value Ref Range   Glucose-Capillary 191 (H) 65 - 99 mg/dL  Glucose, capillary  Result Value Ref Range   Glucose-Capillary 201 (H) 65 - 99 mg/dL   Comment 1 Notify RN    Comment 2 Document in Chart   Glucose, capillary  Result Value Ref Range   Glucose-Capillary 128 (H) 65 - 99 mg/dL    Dg Cervical Spine 1 View  06/27/2015  CLINICAL DATA:  Posterior cervical fusion C4-C7. EXAM: DG C-ARM 61-120 MIN; DG CERVICAL SPINE - 1 VIEW COMPARISON:  06/04/2015 cervical spine radiographs FINDINGS: Fluoroscopy time 0 minutes 13 seconds. Single nondiagnostic spot fluoroscopic intraoperative  radiograph demonstrates postsurgical changes from posterior cervical spine fusion from C4-C7. IMPRESSION: Intraoperative fluoroscopic guidance for posterior cervical spine fusion C4-C7. Electronically Signed   By: Ilona Sorrel M.D.   On: 06/27/2015 11:08   Dg C-arm 1-60 Min  06/27/2015  CLINICAL DATA:  Posterior cervical fusion C4-C7. EXAM: DG C-ARM 61-120 MIN; DG CERVICAL SPINE - 1 VIEW COMPARISON:  06/04/2015 cervical spine radiographs FINDINGS: Fluoroscopy time 0 minutes 13 seconds. Single nondiagnostic spot fluoroscopic intraoperative radiograph demonstrates postsurgical changes from posterior cervical spine fusion from C4-C7. IMPRESSION: Intraoperative fluoroscopic guidance for posterior cervical spine fusion C4-C7. Electronically Signed   By: Ilona Sorrel M.D.   On: 06/27/2015 11:08    Antibiotics:  Anti-infectives    Start     Dose/Rate Route Frequency Ordered Stop   06/27/15 1700  ceFAZolin (ANCEF) IVPB 1 g/50 mL premix     1 g 100 mL/hr over 30 Minutes Intravenous Every 8 hours 06/27/15 1246 06/28/15 0130   06/27/15 1034  vancomycin (VANCOCIN) powder  Status:  Discontinued       As needed 06/27/15 1035 06/27/15 1115   06/27/15 0915  vancomycin (VANCOCIN) 1000 MG powder    Comments:  Bryna Colander   : cabinet override      06/27/15 0915 06/27/15 2129   06/27/15 0900  bacitracin 50,000 Units in sodium chloride irrigation 0.9 % 500 mL irrigation  Status:  Discontinued       As needed 06/27/15 1008 06/27/15 1115   06/27/15 0730  ceFAZolin (ANCEF) IVPB 2g/100 mL premix  2 g 200 mL/hr over 30 Minutes Intravenous On call to O.R. 06/27/15 6389 06/27/15 3734      Discharge Exam: Blood pressure 164/71, pulse 63, temperature 98.1 F (36.7 C), temperature source Oral, resp. rate 20, weight 83.462 kg (184 lb), SpO2 94 %. Neurologic: Grossly normal Dressing dry  Discharge Medications:     Medication List    TAKE these medications        amLODipine 5 MG tablet  Commonly known  as:  NORVASC  Take 1 tablet by mouth  daily     aspirin 81 MG EC tablet  Take 81 mg by mouth daily.     atorvastatin 40 MG tablet  Commonly known as:  LIPITOR  Take 1 tablet by mouth  daily     cetirizine 10 MG tablet  Commonly known as:  ZYRTEC  Take 1 tablet (10 mg total) by mouth daily.     fesoterodine 4 MG Tb24 tablet  Commonly known as:  TOVIAZ  Take 1 tablet (4 mg total) by mouth daily.     glimepiride 1 MG tablet  Commonly known as:  AMARYL  Take one-half tablet by  mouth two times daily  before meals     losartan-hydrochlorothiazide 100-25 MG tablet  Commonly known as:  HYZAAR  Take 1 tablet by mouth  daily     metFORMIN 500 MG tablet  Commonly known as:  GLUCOPHAGE  Take 2 tablets by mouth  twice a day with meals     methocarbamol 500 MG tablet  Commonly known as:  ROBAXIN  Take 1 tablet (500 mg total) by mouth every 6 (six) hours as needed for muscle spasms.     MULTIVITAMIN PO  Take 1 tablet by mouth daily.     oxyCODONE-acetaminophen 5-325 MG tablet  Commonly known as:  PERCOCET/ROXICET  Take 1 tablet by mouth every 4 (four) hours as needed for moderate pain.     pantoprazole 40 MG tablet  Commonly known as:  PROTONIX  Take 1 tablet by mouth  daily     sildenafil 100 MG tablet  Commonly known as:  VIAGRA  Take 0.5-1 tablets (50-100 mg total) by mouth daily as needed for erectile dysfunction.     Vitamin D3 1000 units Caps  Take 1 capsule (1,000 Units total) by mouth daily.        Disposition: Home   Final Dx: Posterior cervical fusion C4-C7      Discharge Instructions     Remove dressing in 72 hours    Complete by:  As directed      Call MD for:  difficulty breathing, headache or visual disturbances    Complete by:  As directed      Call MD for:  persistant nausea and vomiting    Complete by:  As directed      Call MD for:  redness, tenderness, or signs of infection (pain, swelling, redness, odor or green/yellow discharge around  incision site)    Complete by:  As directed      Call MD for:  severe uncontrolled pain    Complete by:  As directed      Call MD for:  temperature >100.4    Complete by:  As directed      Diet - low sodium heart healthy    Complete by:  As directed      Discharge instructions    Complete by:  As directed   No bending or twisting or heavy lifting,  may shower     Increase activity slowly    Complete by:  As directed            Follow-up Information    Follow up with JONES,DAVID S, MD In 2 weeks.   Specialty:  Neurosurgery   Contact information:   [address] [place] [address] [phone]        Signed: Eustace Moore 06/28/2015, 9:26 AM

## 2015-06-28 NOTE — Progress Notes (Signed)
Pt doing well. Pt and daughter given D/C instructions with Rx's, verbal understanding was provided. Pt's incision is covered with Honeycomb dressing and has minimal drainage. Pt's IV was removed prior to D/C. Pt D/C'd home via wheelchair @ 1100 per MD order. Pt is stable @ D/C and has no other needs at this time. Holli Humbles, RN

## 2015-06-28 NOTE — Discharge Instructions (Signed)
Wound Care Keep incision covered and dry for one week.  If you shower prior to then, cover incision with plastic wrap.  You may remove outer bandage after one week and shower.  Do not put any creams, lotions, or ointments on incision. Leave steri-strips on neck.  They will fall off by themselves. Activity Walk each and every day, increasing distance each day. No lifting greater than 5 lbs.  Avoid excessive neck motion. No driving for 2 weeks; may ride as a passenger locally. Wear neck brace at all times except when showering or otherwise instructed. Diet Resume your normal diet.  Return to Work Will be discussed at you follow up appointment. Call Your Doctor If Any of These Occur Redness, drainage, or swelling at the wound.  Temperature greater than 101 degrees. Severe pain not relieved by pain medication. Increased difficulty swallowing.  Incision starts to come apart. Follow Up Appt Call today for appointment in 1-2 weeks ([phone]) or for problems.  If you have any hardware placed in your spine, you will need an x-ray before your appointment.

## 2015-08-01 ENCOUNTER — Ambulatory Visit (INDEPENDENT_AMBULATORY_CARE_PROVIDER_SITE_OTHER): Payer: Medicare Other | Admitting: Podiatry

## 2015-08-01 ENCOUNTER — Encounter: Payer: Self-pay | Admitting: Podiatry

## 2015-08-01 VITALS — BP 140/58 | HR 69

## 2015-08-01 DIAGNOSIS — M79606 Pain in leg, unspecified: Secondary | ICD-10-CM

## 2015-08-01 DIAGNOSIS — B351 Tinea unguium: Secondary | ICD-10-CM | POA: Diagnosis not present

## 2015-08-01 DIAGNOSIS — E1142 Type 2 diabetes mellitus with diabetic polyneuropathy: Secondary | ICD-10-CM | POA: Diagnosis not present

## 2015-08-01 NOTE — Patient Instructions (Signed)
Seen for hypertrophic nails. All nails debrided. Return in 3 months or as needed.  

## 2015-08-01 NOTE — Progress Notes (Signed)
Subjective:  81 year old diabetic male presents requesting toe nails and calluses trimmed. Pain in right great toe medial border from callus build up. Blood sugar is under control.   Objective: Pedal pulses are all palpable.  Thick mycotic nails x 10.  Plantar callus under the MPJ right.  No open lesions. No edema or erythema.  Inter digital keratoma 1st and 2nd digit at contact surface left foot.  Severe hallux valgus with bunion bilateral.  Severely contracted 2nd digit left.   Assessment: Diabetic neuropathy.  Hallux valgus with bunion bilateral.  Severe contracted hammer toe deformity 2nd left.  Mycotic nails x 10.   Plan: Debrided all corns and calluses and nails.  Patient will return in 3 months for palliation

## 2015-08-12 DIAGNOSIS — I10 Essential (primary) hypertension: Secondary | ICD-10-CM | POA: Diagnosis not present

## 2015-08-12 DIAGNOSIS — M542 Cervicalgia: Secondary | ICD-10-CM | POA: Diagnosis not present

## 2015-10-08 ENCOUNTER — Ambulatory Visit (INDEPENDENT_AMBULATORY_CARE_PROVIDER_SITE_OTHER): Payer: Medicare Other | Admitting: Internal Medicine

## 2015-10-08 ENCOUNTER — Other Ambulatory Visit (INDEPENDENT_AMBULATORY_CARE_PROVIDER_SITE_OTHER): Payer: Medicare Other

## 2015-10-08 ENCOUNTER — Encounter: Payer: Self-pay | Admitting: Internal Medicine

## 2015-10-08 VITALS — BP 138/70 | HR 80 | Temp 98.2°F | Resp 20 | Wt 182.0 lb

## 2015-10-08 DIAGNOSIS — I10 Essential (primary) hypertension: Secondary | ICD-10-CM

## 2015-10-08 DIAGNOSIS — N183 Chronic kidney disease, stage 3 unspecified: Secondary | ICD-10-CM | POA: Insufficient documentation

## 2015-10-08 DIAGNOSIS — R972 Elevated prostate specific antigen [PSA]: Secondary | ICD-10-CM

## 2015-10-08 DIAGNOSIS — N400 Enlarged prostate without lower urinary tract symptoms: Secondary | ICD-10-CM

## 2015-10-08 DIAGNOSIS — R42 Dizziness and giddiness: Secondary | ICD-10-CM | POA: Diagnosis not present

## 2015-10-08 DIAGNOSIS — M545 Low back pain, unspecified: Secondary | ICD-10-CM | POA: Insufficient documentation

## 2015-10-08 DIAGNOSIS — N1832 Chronic kidney disease, stage 3b: Secondary | ICD-10-CM | POA: Insufficient documentation

## 2015-10-08 DIAGNOSIS — E1140 Type 2 diabetes mellitus with diabetic neuropathy, unspecified: Secondary | ICD-10-CM | POA: Diagnosis not present

## 2015-10-08 LAB — BASIC METABOLIC PANEL
BUN: 26 mg/dL — ABNORMAL HIGH (ref 6–23)
CO2: 28 mEq/L (ref 19–32)
Calcium: 10.4 mg/dL (ref 8.4–10.5)
Chloride: 103 mEq/L (ref 96–112)
Creatinine, Ser: 1.72 mg/dL — ABNORMAL HIGH (ref 0.40–1.50)
GFR: 49.55 mL/min — ABNORMAL LOW (ref >60.00)
Glucose, Bld: 97 mg/dL (ref 70–99)
Potassium: 5.1 mEq/L (ref 3.5–5.1)
Sodium: 140 mEq/L (ref 135–145)

## 2015-10-08 LAB — URINALYSIS, ROUTINE W REFLEX MICROSCOPIC
Bilirubin Urine: NEGATIVE
Hgb urine dipstick: NEGATIVE
Ketones, ur: NEGATIVE
Leukocytes, UA: NEGATIVE
Nitrite: NEGATIVE
RBC / HPF: NONE SEEN (ref 0–?)
Specific Gravity, Urine: <=1.005 — AB (ref 1.000–1.030)
Total Protein, Urine: NEGATIVE
Urine Glucose: NEGATIVE
Urobilinogen, UA: 0.2 (ref 0.0–1.0)
WBC, UA: NONE SEEN (ref 0–?)
pH: 6 (ref 5.0–8.0)

## 2015-10-08 LAB — HEPATIC FUNCTION PANEL
ALT: 11 U/L (ref 0–53)
AST: 20 U/L (ref 0–37)
Albumin: 4.6 g/dL (ref 3.5–5.2)
Alkaline Phosphatase: 95 U/L (ref 39–117)
Bilirubin, Direct: 0.1 mg/dL (ref 0.0–0.3)
Total Bilirubin: 0.5 mg/dL (ref 0.2–1.2)
Total Protein: 7.2 g/dL (ref 6.0–8.3)

## 2015-10-08 LAB — LIPID PANEL
Cholesterol: 127 mg/dL (ref 0–200)
HDL: 64.4 mg/dL (ref >39.00)
LDL Cholesterol: 45 mg/dL (ref 0–99)
NonHDL: 62.34
Total CHOL/HDL Ratio: 2
Triglycerides: 86 mg/dL (ref 0.0–149.0)
VLDL: 17.2 mg/dL (ref 0.0–40.0)

## 2015-10-08 LAB — PSA: PSA: 3.83 ng/mL (ref 0.10–4.00)

## 2015-10-08 LAB — HEMOGLOBIN A1C: Hgb A1c MFr Bld: 6.5 % (ref 4.6–6.5)

## 2015-10-08 MED ORDER — MECLIZINE HCL 12.5 MG PO TABS: 12.5000 mg | ORAL_TABLET | Freq: Three times a day (TID) | ORAL | 2 refills | Status: DC | PRN

## 2015-10-08 MED ORDER — TAMSULOSIN HCL 0.4 MG PO CAPS: 0.4000 mg | ORAL_CAPSULE | Freq: Every day | ORAL | 3 refills | Status: DC

## 2015-10-08 NOTE — Progress Notes (Signed)
Pre visit review using our clinic review tool, if applicable. No additional management support is needed unless otherwise documented below in the visit note.

## 2015-10-08 NOTE — Progress Notes (Signed)
Subjective:    Patient ID: Nicholas Patton, male    DOB: [birth year], 81 y.o.   MRN: [id number]  HPI   Here to f/u; overall doing ok,  Pt denies chest pain, increasing sob or doe, wheezing, orthopnea, PND, increased LE swelling, palpitations, or syncope, but still has occasional vertigo, just a few seconds with laying in bed.  .  Pt denies new neurological symptoms such as new headache, or facial or extremity weakness or numbness.  Pt denies polydipsia, polyuria, or low sugar episode.   Pt denies new neurological symptoms such as new headache, or facial or extremity weakness or numbness.   Pt states overall good compliance with meds, mostly trying to follow appropriate diet, with wt overall stable,  but little exercise however. S/p April 2017 cspine fusion with some improvement in pain, though DJD persists and stil some stiffness and some difficulty to turn the head to left, still hard to see left blind spot with driving, no recent near accidents. Denies urinary symptoms such as dysuria, frequency, urgency, flank pain, hematuria or n/v, fever, chills. Asks to f/u CKD, also .Pt continues to have recurring LBP for several months bilat non midline, bowel or bladder change, fever, wt loss,  worsening LE pain/numbness/weakness, gait change or falls. Past Medical History:  Diagnosis Date  . Arthritis   . DIABETES MELLITUS, TYPE II    takes Metformin and Glimepiride daily  . GERD (gastroesophageal reflux disease)    takes Protonix daily  . History of colon polyps   . HYPERLIPIDEMIA    takes Atorvastatin daily  . HYPERTENSION    takes Amlodipine and Hyzaar daily  . Ileus following gastrointestinal surgery 07/22/2012  . Joint pain   . Peripheral neuropathy ([place])   . Peripheral neuropathy ([place])   . PERIPHERAL NEUROPATHY, FEET 10/06/2007  . VITAMIN B12 DEFICIENCY 09/06/2008  . VITAMIN D DEFICIENCY 01/16/2010   takes Vitamin D daily   Past Surgical History:  Procedure Laterality Date  . CATARACT  EXTRACTION     both eyes  . CHOLECYSTECTOMY N/A 07/18/2012   Procedure: LAPAROSCOPIC CHOLECYSTECTOMY WITH INTRAOPERATIVE CHOLANGIOGRAM;  Surgeon: Zenovia Jarred, MD;  Location: [facility];  Service: General;  Laterality: N/A;  . COLONOSCOPY    . POSTERIOR CERVICAL FUSION/FORAMINOTOMY N/A 06/27/2015   Procedure: Posterior Cervical Fusion with lateral mass fixation Cervical four to cervical seven;  Surgeon: Eustace Moore, MD;  Location: [place] [facility];  Service: Neurosurgery;  Laterality: N/A;  . TOTAL KNEE ARTHROPLASTY  2001   Left  . TURP VAPORIZATION      reports that he has quit smoking. His smoking use included Cigarettes. He smoked 0.00 packs per day. He has never used smokeless tobacco. He reports that he does not drink alcohol or use drugs. family history includes Diabetes Mellitus II in his mother; Lung cancer in his brother. No Known Allergies Current Outpatient Prescriptions on File Prior to Visit  Medication Sig Dispense Refill  . amLODipine (NORVASC) 5 MG tablet Take 1 tablet by mouth  daily 90 tablet 3  . aspirin 81 MG EC tablet Take 81 mg by mouth daily.      Marland Kitchen atorvastatin (LIPITOR) 40 MG tablet Take 1 tablet by mouth  daily 90 tablet 3  . Cholecalciferol (VITAMIN D3) 1000 UNITS CAPS Take 1 capsule (1,000 Units total) by mouth daily. 90 capsule 3  . glimepiride (AMARYL) 1 MG tablet Take one-half tablet by  mouth two times daily  before meals 90 tablet 3  .  losartan-hydrochlorothiazide (HYZAAR) 100-25 MG tablet Take 1 tablet by mouth  daily 90 tablet 3  . metFORMIN (GLUCOPHAGE) 500 MG tablet Take 2 tablets by mouth  twice a day with meals 360 tablet 3  . methocarbamol (ROBAXIN) 500 MG tablet Take 1 tablet (500 mg total) by mouth every 6 (six) hours as needed for muscle spasms. 60 tablet 1  . Multiple Vitamins-Minerals (MULTIVITAMIN PO) Take 1 tablet by mouth daily.    . pantoprazole (PROTONIX) 40 MG tablet Take 1 tablet by mouth  daily 90 tablet 3  . cetirizine (ZYRTEC) 10 MG tablet  Take 1 tablet (10 mg total) by mouth daily. (Patient not taking: Reported on 06/17/2015) 30 tablet 11  . fesoterodine (TOVIAZ) 4 MG TB24 tablet Take 1 tablet (4 mg total) by mouth daily. (Patient not taking: Reported on 06/17/2015) 90 tablet 3  . oxyCODONE-acetaminophen (PERCOCET/ROXICET) 5-325 MG tablet Take 1 tablet by mouth every 4 (four) hours as needed for moderate pain. (Patient not taking: Reported on 10/08/2015) 60 tablet 0  . sildenafil (VIAGRA) 100 MG tablet Take 0.5-1 tablets (50-100 mg total) by mouth daily as needed for erectile dysfunction. (Patient not taking: Reported on 06/17/2015) 2 tablet 11   No current facility-administered medications on file prior to visit.    Review of Systems  Constitutional: Negative for unusual diaphoresis or night sweats HENT: Negative for ear swelling or discharge Eyes: Negative for worsening visual haziness  Respiratory: Negative for choking and stridor.   Gastrointestinal: Negative for distension or worsening eructation Genitourinary: Negative for retention or change in urine volume.  Musculoskeletal: Negative for other MSK pain or swelling Skin: Negative for color change and worsening wound Neurological: Negative for tremors and numbness other than noted  Psychiatric/Behavioral: Negative for decreased concentration or agitation other than above       Objective:   Physical Exam BP 138/70   Pulse 80   Temp 98.2 F (36.8 C) (Oral)   Resp 20   Wt 182 lb (82.6 kg)   SpO2 97%   BMI 23.37 kg/m  VS noted,  Constitutional: Pt appears in no apparent distress HENT: Head: NCAT.  Right Ear: External ear normal.  Left Ear: External ear normal.  Eyes: . Pupils are equal, round, and reactive to light. Conjunctivae and EOM are normal Neck: Normal range of motion. Neck supple.  Cardiovascular: Normal rate and regular rhythm.   Pulmonary/Chest: Effort normal and breath sounds without rales or wheezing.  Abd:  Soft, NT, ND, + BS Neurological: Pt is  alert. Not confused , motor grossly intact Spine: nontender midline or paravertebral Skin: Skin is warm. No rash, no LE edema Psychiatric: Pt behavior is normal. No agitation.     Assessment & Plan:

## 2015-10-08 NOTE — Patient Instructions (Signed)
Please take all new medication as prescribed - the flomax, and the meclizine for dizziness  Please continue all other medications as before, and refills have been done if requested.  Please have the pharmacy call with any other refills you may need.  Please continue your efforts at being more active, low cholesterol diabetic diet, and weight controll  Please keep your appointments with your specialists as you may have planned  Please go to the LAB in the Basement (turn left off the elevator) for the tests to be done today  You will be contacted by phone if any changes need to be made immediately.  Otherwise, you will receive a letter about your results with an explanation, but please check with MyChart first.  Please remember to sign up for MyChart if you have not done so, as this will be important to you in the future with finding out test results, communicating by private email, and scheduling acute appointments online when needed.  Please return in 6 months, or sooner if needed

## 2015-10-13 NOTE — Assessment & Plan Note (Signed)
stable overall by history and exam, recent data reviewed with pt, and pt to continue medical treatment as before,  to f/u any worsening symptoms or concerns BP Readings from Last 3 Encounters:  10/08/15 138/70  08/01/15 (!) 140/58  06/28/15 (!) 164/71

## 2015-10-13 NOTE — Assessment & Plan Note (Signed)
Mild symptomatic, for flomax trial,  to f/u any worsening symptoms or concerns

## 2015-10-13 NOTE — Assessment & Plan Note (Signed)
Chronic recurrent, none today,  to f/u any worsening symptoms or concerns

## 2015-10-13 NOTE — Assessment & Plan Note (Signed)
Lab Results  Component Value Date   CREATININE 1.72 (H) 10/08/2015   stable overall by history and exam, recent data reviewed with pt, and pt to continue medical treatment as before,  to f/u any worsening symptoms or concerns

## 2015-10-13 NOTE — Assessment & Plan Note (Signed)
stable overall by history and exam, recent data reviewed with pt, and pt to continue medical treatment as before,  to f/u any worsening symptoms or concerns Lab Results  Component Value Date   HGBA1C 6.5 10/08/2015    

## 2015-10-13 NOTE — Assessment & Plan Note (Signed)
Mild, peripheral related, for meclizine prn,  to f/u any worsening symptoms or concerns

## 2015-10-13 NOTE — Assessment & Plan Note (Signed)
For f/u psa,  to f/u any worsening symptoms or concerns

## 2015-10-25 ENCOUNTER — Ambulatory Visit (INDEPENDENT_AMBULATORY_CARE_PROVIDER_SITE_OTHER): Payer: Medicare Other | Admitting: Internal Medicine

## 2015-10-25 ENCOUNTER — Encounter: Payer: Self-pay | Admitting: Internal Medicine

## 2015-10-25 ENCOUNTER — Other Ambulatory Visit (INDEPENDENT_AMBULATORY_CARE_PROVIDER_SITE_OTHER): Payer: Medicare Other

## 2015-10-25 VITALS — BP 118/68 | HR 96 | Temp 98.3°F | Resp 20 | Wt 178.0 lb

## 2015-10-25 DIAGNOSIS — I10 Essential (primary) hypertension: Secondary | ICD-10-CM | POA: Diagnosis not present

## 2015-10-25 DIAGNOSIS — N183 Chronic kidney disease, stage 3 unspecified: Secondary | ICD-10-CM

## 2015-10-25 DIAGNOSIS — E1140 Type 2 diabetes mellitus with diabetic neuropathy, unspecified: Secondary | ICD-10-CM | POA: Diagnosis not present

## 2015-10-25 DIAGNOSIS — G629 Polyneuropathy, unspecified: Secondary | ICD-10-CM | POA: Diagnosis not present

## 2015-10-25 DIAGNOSIS — M792 Neuralgia and neuritis, unspecified: Secondary | ICD-10-CM | POA: Insufficient documentation

## 2015-10-25 LAB — BASIC METABOLIC PANEL
BUN: 22 mg/dL (ref 6–23)
CO2: 30 mEq/L (ref 19–32)
Calcium: 10.5 mg/dL (ref 8.4–10.5)
Chloride: 101 mEq/L (ref 96–112)
Creatinine, Ser: 1.68 mg/dL — ABNORMAL HIGH (ref 0.40–1.50)
GFR: 50.91 mL/min — ABNORMAL LOW (ref >60.00)
Glucose, Bld: 99 mg/dL (ref 70–99)
Potassium: 4.6 mEq/L (ref 3.5–5.1)
Sodium: 139 mEq/L (ref 135–145)

## 2015-10-25 MED ORDER — AMITRIPTYLINE HCL 50 MG PO TABS: 50.0000 mg | ORAL_TABLET | Freq: Every day | ORAL | 1 refills | Status: DC

## 2015-10-25 NOTE — Progress Notes (Signed)
Subjective:    Patient ID: Nicholas Patton, male    DOB: September 04, 1934, 81 y.o.   MRN: [id number]  HPI Here to f/u; overall doing ok,  Pt denies chest pain, increasing sob or doe, wheezing, orthopnea, PND, increased LE swelling, palpitations, dizziness or syncope.  Pt denies new neurological symptoms such as new headache, or facial or extremity weakness, but does have a burning type pain to the distal legs very mild during day but seems worse trying to go to sleep.  Pt denies polydipsia, polyuria, but has had several low sugars overnight without bedtime snack, wanting to know if he can reduce the glimeparide..   Pt denies new neurological symptoms such as new headache, or facial or extremity weakness or numbness.   Pt states overall good compliance with meds, mostly trying to follow appropriate diet, with wt overall stable  Wt Readings from Last 3 Encounters:  10/25/15 178 lb (80.7 kg)  10/08/15 182 lb (82.6 kg)  06/27/15 184 lb (83.5 kg)   Past Medical History:  Diagnosis Date  . Arthritis   . DIABETES MELLITUS, TYPE II    takes Metformin and Glimepiride daily  . GERD (gastroesophageal reflux disease)    takes Protonix daily  . History of colon polyps   . HYPERLIPIDEMIA    takes Atorvastatin daily  . HYPERTENSION    takes Amlodipine and Hyzaar daily  . Ileus following gastrointestinal surgery 07/22/2012  . Joint pain   . Peripheral neuropathy ([place])   . Peripheral neuropathy ([place])   . PERIPHERAL NEUROPATHY, FEET 10/06/2007  . VITAMIN B12 DEFICIENCY 09/06/2008  . VITAMIN D DEFICIENCY 01/16/2010   takes Vitamin D daily   Past Surgical History:  Procedure Laterality Date  . CATARACT EXTRACTION     both eyes  . CHOLECYSTECTOMY N/A 07/18/2012   Procedure: LAPAROSCOPIC CHOLECYSTECTOMY WITH INTRAOPERATIVE CHOLANGIOGRAM;  Surgeon: Zenovia Jarred, MD;  Location: [facility];  Service: General;  Laterality: N/A;  . COLONOSCOPY    . POSTERIOR CERVICAL FUSION/FORAMINOTOMY N/A 06/27/2015   Procedure:  Posterior Cervical Fusion with lateral mass fixation Cervical four to cervical seven;  Surgeon: Eustace Moore, MD;  Location: [place] [facility];  Service: Neurosurgery;  Laterality: N/A;  . TOTAL KNEE ARTHROPLASTY  2001   Left  . TURP VAPORIZATION      reports that he has quit smoking. His smoking use included Cigarettes. He smoked 0.00 packs per day. He has never used smokeless tobacco. He reports that he does not drink alcohol or use drugs. family history includes Diabetes Mellitus II in his mother; Lung cancer in his brother. No Known Allergies Current Outpatient Prescriptions on File Prior to Visit  Medication Sig Dispense Refill  . amLODipine (NORVASC) 5 MG tablet Take 1 tablet by mouth  daily 90 tablet 3  . aspirin 81 MG EC tablet Take 81 mg by mouth daily.      Marland Kitchen atorvastatin (LIPITOR) 40 MG tablet Take 1 tablet by mouth  daily 90 tablet 3  . cetirizine (ZYRTEC) 10 MG tablet Take 1 tablet (10 mg total) by mouth daily. 30 tablet 11  . Cholecalciferol (VITAMIN D3) 1000 UNITS CAPS Take 1 capsule (1,000 Units total) by mouth daily. 90 capsule 3  . fesoterodine (TOVIAZ) 4 MG TB24 tablet Take 1 tablet (4 mg total) by mouth daily. 90 tablet 3  . losartan-hydrochlorothiazide (HYZAAR) 100-25 MG tablet Take 1 tablet by mouth  daily 90 tablet 3  . meclizine (ANTIVERT) 12.5 MG tablet Take 1 tablet (12.5  mg total) by mouth 3 (three) times daily as needed for dizziness. 30 tablet 2  . metFORMIN (GLUCOPHAGE) 500 MG tablet Take 2 tablets by mouth  twice a day with meals 360 tablet 3  . methocarbamol (ROBAXIN) 500 MG tablet Take 1 tablet (500 mg total) by mouth every 6 (six) hours as needed for muscle spasms. 60 tablet 1  . Multiple Vitamins-Minerals (MULTIVITAMIN PO) Take 1 tablet by mouth daily.    Marland Kitchen oxyCODONE-acetaminophen (PERCOCET/ROXICET) 5-325 MG tablet Take 1 tablet by mouth every 4 (four) hours as needed for moderate pain. 60 tablet 0  . pantoprazole (PROTONIX) 40 MG tablet Take 1 tablet by mouth   daily 90 tablet 3  . sildenafil (VIAGRA) 100 MG tablet Take 0.5-1 tablets (50-100 mg total) by mouth daily as needed for erectile dysfunction. 2 tablet 11  . tamsulosin (FLOMAX) 0.4 MG CAPS capsule Take 1 capsule (0.4 mg total) by mouth daily. 90 capsule 3   No current facility-administered medications on file prior to visit.    Review of Systems  Constitutional: Negative for unusual diaphoresis or night sweats HENT: Negative for ear swelling or discharge Eyes: Negative for worsening visual haziness  Respiratory: Negative for choking and stridor.   Gastrointestinal: Negative for distension or worsening eructation Genitourinary: Negative for retention or change in urine volume.  Musculoskeletal: Negative for other MSK pain or swelling Skin: Negative for color change and worsening wound Neurological: Negative for tremors and numbness other than noted  Psychiatric/Behavioral: Negative for decreased concentration or agitation other than above       Objective:   Physical Exam BP 118/68   Pulse 96   Temp 98.3 F (36.8 C) (Oral)   Resp 20   Wt 178 lb (80.7 kg)   SpO2 94%   BMI 22.85 kg/m  VS noted,  Constitutional: Pt appears in no apparent distress HENT: Head: NCAT.  Right Ear: External ear normal.  Left Ear: External ear normal.  Eyes: . Pupils are equal, round, and reactive to light. Conjunctivae and EOM are normal Neck: Normal range of motion. Neck supple.  Cardiovascular: Normal rate and regular rhythm.   Pulmonary/Chest: Effort normal and breath sounds without rales or wheezing.  Abd:  Soft, NT, ND, + BS Neurological: Pt is alert. Not confused , motor grossly intact, + decreased sens to LT  Skin: Skin is warm. No rash, no LE edema Psychiatric: Pt behavior is normal. No agitation.    Assessment & Plan:

## 2015-10-25 NOTE — Progress Notes (Signed)
Pre visit review using our clinic review tool, if applicable. No additional management support is needed unless otherwise documented below in the visit note.

## 2015-10-25 NOTE — Patient Instructions (Addendum)
Please take all new medication as prescribed  - the elavil at bedtime for leg pain (and please call if not enough, as the medication can be increased)  OK to stop the glimeparide  Please continue all other medications as before, and refills have been done if requested.  Please have the pharmacy call with any other refills you may need.  Please continue your efforts at being more active, low cholesterol diet, and weight control.  Please keep your appointments with your specialists as you may have planned  Please go to the LAB in the Basement (turn left off the elevator) for the tests to be done today  You will be contacted by phone if any changes need to be made immediately.  Otherwise, you will receive a letter about your results with an explanation, but please check with MyChart first.  Please remember to sign up for MyChart if you have not done so, as this will be important to you in the future with finding out test results, communicating by private email, and scheduling acute appointments online when needed.  Please return in 6 months, or sooner if needed, with Lab testing done 3-5 days before

## 2015-10-26 NOTE — Assessment & Plan Note (Signed)
Mild to mod, for trial elavil qhs,  to f/u any worsening symptoms or concerns

## 2015-10-26 NOTE — Assessment & Plan Note (Signed)
stable overall by history and exam, recent data reviewed with pt, and pt to continue medical treatment as before,  to f/u any worsening symptoms or concerns BP Readings from Last 3 Encounters:  10/25/15 118/68  10/08/15 138/70  08/01/15 (!) 140/58

## 2015-10-26 NOTE — Assessment & Plan Note (Signed)
Mild to mod, for f/u bmp, consider renal referral, to f/u any worsening symptoms or concerns

## 2015-10-26 NOTE — Assessment & Plan Note (Signed)
Ok for d/c glimeparide due to low sugars and a1c well controlled, o/w stable overall by history and exam, recent data reviewed with pt, and pt to continue medical treatment as before,  to f/u any worsening symptoms or concerns Lab Results  Component Value Date   HGBA1C 6.5 10/08/2015

## 2015-10-28 DIAGNOSIS — M542 Cervicalgia: Secondary | ICD-10-CM | POA: Diagnosis not present

## 2015-10-31 ENCOUNTER — Ambulatory Visit: Payer: Medicare Other | Admitting: Podiatry

## 2015-10-31 ENCOUNTER — Encounter: Payer: Self-pay | Admitting: Podiatry

## 2015-10-31 ENCOUNTER — Ambulatory Visit (INDEPENDENT_AMBULATORY_CARE_PROVIDER_SITE_OTHER): Payer: Medicare Other | Admitting: Podiatry

## 2015-10-31 VITALS — BP 138/60 | HR 63

## 2015-10-31 DIAGNOSIS — M79606 Pain in leg, unspecified: Secondary | ICD-10-CM

## 2015-10-31 DIAGNOSIS — M79673 Pain in unspecified foot: Secondary | ICD-10-CM | POA: Diagnosis not present

## 2015-10-31 DIAGNOSIS — B351 Tinea unguium: Secondary | ICD-10-CM

## 2015-10-31 NOTE — Patient Instructions (Signed)
Seen for hypertrophic nails. All nails debrided. Return in 3 months or as needed.  

## 2015-10-31 NOTE — Progress Notes (Signed)
Subjective:  81 year old diabetic male presents requesting toe nails and calluses trimmed. Both big toes are painful at medial border from callus build up. Blood sugar is under control.   Objective: Pedal pulses are all palpable.  Thick mycotic nails x 10.  Plantar callus under the MPJ right.  No open lesions. No edema or erythema.  Inter digital keratoma 1st and 2nd digit at contact surface left foot.  Severe hallux valgus with bunion bilateral.  Severely contracted 2nd digit left.   Assessment: Diabetic neuropathy.  Hallux valgus with bunion bilateral.  Severe contracted hammer toe deformity 2nd left.  Mycotic nails x 10.   Plan: Debrided all corns and calluses and nails.  Patient will return in 3 months for palliation

## 2015-11-01 DIAGNOSIS — M542 Cervicalgia: Secondary | ICD-10-CM | POA: Diagnosis not present

## 2015-11-05 DIAGNOSIS — M542 Cervicalgia: Secondary | ICD-10-CM | POA: Diagnosis not present

## 2015-11-08 DIAGNOSIS — M542 Cervicalgia: Secondary | ICD-10-CM | POA: Diagnosis not present

## 2015-11-12 DIAGNOSIS — H43821 Vitreomacular adhesion, right eye: Secondary | ICD-10-CM | POA: Diagnosis not present

## 2015-11-12 DIAGNOSIS — E119 Type 2 diabetes mellitus without complications: Secondary | ICD-10-CM | POA: Diagnosis not present

## 2015-11-12 DIAGNOSIS — M542 Cervicalgia: Secondary | ICD-10-CM | POA: Diagnosis not present

## 2015-11-12 DIAGNOSIS — H04123 Dry eye syndrome of bilateral lacrimal glands: Secondary | ICD-10-CM | POA: Diagnosis not present

## 2015-11-12 DIAGNOSIS — H4322 Crystalline deposits in vitreous body, left eye: Secondary | ICD-10-CM | POA: Diagnosis not present

## 2015-11-12 DIAGNOSIS — Z961 Presence of intraocular lens: Secondary | ICD-10-CM | POA: Diagnosis not present

## 2015-11-12 DIAGNOSIS — H353131 Nonexudative age-related macular degeneration, bilateral, early dry stage: Secondary | ICD-10-CM | POA: Diagnosis not present

## 2015-11-12 DIAGNOSIS — H10413 Chronic giant papillary conjunctivitis, bilateral: Secondary | ICD-10-CM | POA: Diagnosis not present

## 2015-11-12 DIAGNOSIS — H40013 Open angle with borderline findings, low risk, bilateral: Secondary | ICD-10-CM | POA: Diagnosis not present

## 2015-11-14 DIAGNOSIS — M542 Cervicalgia: Secondary | ICD-10-CM | POA: Diagnosis not present

## 2015-11-19 DIAGNOSIS — M542 Cervicalgia: Secondary | ICD-10-CM | POA: Diagnosis not present

## 2015-11-21 ENCOUNTER — Encounter: Payer: Self-pay | Admitting: Internal Medicine

## 2015-11-21 ENCOUNTER — Other Ambulatory Visit (INDEPENDENT_AMBULATORY_CARE_PROVIDER_SITE_OTHER): Payer: Medicare Other

## 2015-11-21 ENCOUNTER — Ambulatory Visit (INDEPENDENT_AMBULATORY_CARE_PROVIDER_SITE_OTHER): Payer: Medicare Other | Admitting: Internal Medicine

## 2015-11-21 VITALS — BP 134/80 | HR 100 | Temp 98.0°F | Resp 20 | Wt 178.0 lb

## 2015-11-21 DIAGNOSIS — M545 Low back pain, unspecified: Secondary | ICD-10-CM

## 2015-11-21 DIAGNOSIS — E1140 Type 2 diabetes mellitus with diabetic neuropathy, unspecified: Secondary | ICD-10-CM | POA: Diagnosis not present

## 2015-11-21 DIAGNOSIS — I10 Essential (primary) hypertension: Secondary | ICD-10-CM | POA: Diagnosis not present

## 2015-11-21 DIAGNOSIS — Z23 Encounter for immunization: Secondary | ICD-10-CM

## 2015-11-21 DIAGNOSIS — K921 Melena: Secondary | ICD-10-CM | POA: Diagnosis not present

## 2015-11-21 LAB — URINALYSIS, ROUTINE W REFLEX MICROSCOPIC
Bilirubin Urine: NEGATIVE
Hgb urine dipstick: NEGATIVE
Ketones, ur: NEGATIVE
Leukocytes, UA: NEGATIVE
Nitrite: NEGATIVE
RBC / HPF: NONE SEEN (ref 0–?)
Specific Gravity, Urine: <=1.005 — AB (ref 1.000–1.030)
Total Protein, Urine: NEGATIVE
Urine Glucose: NEGATIVE
Urobilinogen, UA: 0.2 (ref 0.0–1.0)
pH: 6.5 (ref 5.0–8.0)

## 2015-11-21 LAB — CBC WITH DIFFERENTIAL/PLATELET
Basophils Absolute: 0 10*3/uL (ref 0.0–0.1)
Basophils Relative: 0.5 % (ref 0.0–3.0)
Eosinophils Absolute: 0.1 10*3/uL (ref 0.0–0.7)
Eosinophils Relative: 1.5 % (ref 0.0–5.0)
HCT: 39.8 % (ref 39.0–52.0)
Hemoglobin: 13.1 g/dL (ref 13.0–17.0)
Lymphocytes Relative: 26.5 % (ref 12.0–46.0)
Lymphs Abs: 1.2 10*3/uL (ref 0.7–4.0)
MCHC: 33 g/dL (ref 30.0–36.0)
MCV: 85 fl (ref 78.0–100.0)
Monocytes Absolute: 0.4 10*3/uL (ref 0.1–1.0)
Monocytes Relative: 7.7 % (ref 3.0–12.0)
Neutro Abs: 3 10*3/uL (ref 1.4–7.7)
Neutrophils Relative %: 63.8 % (ref 43.0–77.0)
Platelets: 236 10*3/uL (ref 150.0–400.0)
RBC: 4.68 Mil/uL (ref 4.22–5.81)
RDW: 15.8 % — ABNORMAL HIGH (ref 11.5–15.5)
WBC: 4.7 10*3/uL (ref 4.0–10.5)

## 2015-11-21 MED ORDER — GABAPENTIN 100 MG PO CAPS: 100.0000 mg | ORAL_CAPSULE | Freq: Three times a day (TID) | ORAL | 0 refills | Status: DC

## 2015-11-21 MED ORDER — GABAPENTIN 300 MG PO CAPS: 300.0000 mg | ORAL_CAPSULE | Freq: Three times a day (TID) | ORAL | 3 refills | Status: DC

## 2015-11-21 NOTE — Patient Instructions (Signed)
Please take all new medication as prescribed - the gabapentin 100 mg three times per day for 1 wk, THEN 300 mg three times per day after that  Please continue all other medications as before, and refills have been done if requested.  Please have the pharmacy call with any other refills you may need..  Please keep your appointments with your specialists as you may have planned  Please go to the LAB in the Basement (turn left off the elevator) for the tests to be done today  You will be contacted by phone if any changes need to be made immediately.  Otherwise, you will receive a letter about your results with an explanation, but please check with MyChart first.  Please remember to sign up for MyChart if you have not done so, as this will be important to you in the future with finding out test results, communicating by private email, and scheduling acute appointments online when needed.

## 2015-11-21 NOTE — Assessment & Plan Note (Signed)
?  Hemorrhoidal vs other, for cbc, f/u Dr Hilarie Fredrickson

## 2015-11-21 NOTE — Progress Notes (Signed)
Subjective:    Patient ID: Nicholas Patton, male    DOB: [birth year], 81 y.o.   MRN: [id number]  HPI  Here to f/u c/o LBP, seemed to start x 2 wks after using bands for arms excercises, which involves some bending and twisting at the lower back.  Mild to mod, intermittent, sharp and dull sometimes, worse to bend and lie down at night., better to sit still.  Has pain also assoc with the bilat lower abd/sides he thinks may be muscle soreness soreness assoc as well. Some pain also radiates to bilat buttocks but without LE pain/numb/weakness worsening, though has hx of peripheral neuropathy with pain..  Denies urinary symptoms such as dysuria, frequency, urgency, flank pain, hematuria or n/v, fever, chills.  Denies worsening reflux, abd pain, dysphagia, n/v, bowel change, but has had intermittent BRBPR 4 times 2 days ago without pain.  Pt denies fever, wt loss, night sweats, loss of appetite, or other constitutional symptoms    Last colonoscopy sept 2011 Past Medical History:  Diagnosis Date  . Arthritis   . DIABETES MELLITUS, TYPE II    takes Metformin and Glimepiride daily  . GERD (gastroesophageal reflux disease)    takes Protonix daily  . History of colon polyps   . HYPERLIPIDEMIA    takes Atorvastatin daily  . HYPERTENSION    takes Amlodipine and Hyzaar daily  . Ileus following gastrointestinal surgery 07/22/2012  . Joint pain   . Peripheral neuropathy ([place])   . Peripheral neuropathy ([place])   . PERIPHERAL NEUROPATHY, FEET 10/06/2007  . VITAMIN B12 DEFICIENCY 09/06/2008  . VITAMIN D DEFICIENCY 01/16/2010   takes Vitamin D daily   Past Surgical History:  Procedure Laterality Date  . CATARACT EXTRACTION     both eyes  . CHOLECYSTECTOMY N/A 07/18/2012   Procedure: LAPAROSCOPIC CHOLECYSTECTOMY WITH INTRAOPERATIVE CHOLANGIOGRAM;  Surgeon: Zenovia Jarred, MD;  Location: [facility];  Service: General;  Laterality: N/A;  . COLONOSCOPY    . POSTERIOR CERVICAL FUSION/FORAMINOTOMY N/A 06/27/2015   Procedure: Posterior Cervical Fusion with lateral mass fixation Cervical four to cervical seven;  Surgeon: Eustace Moore, MD;  Location: [place];  Service: Neurosurgery;  Laterality: N/A;  . TOTAL KNEE ARTHROPLASTY  2001   Left  . TURP VAPORIZATION      reports that he has quit smoking. His smoking use included Cigarettes. He smoked 0.00 packs per day. He has never used smokeless tobacco. He reports that he does not drink alcohol or use drugs. family history includes Diabetes Mellitus II in his mother; Lung cancer in his brother. No Known Allergies Current Outpatient Prescriptions on File Prior to Visit  Medication Sig Dispense Refill  . amitriptyline (ELAVIL) 50 MG tablet Take 1 tablet (50 mg total) by mouth at bedtime. 90 tablet 1  . amLODipine (NORVASC) 5 MG tablet Take 1 tablet by mouth  daily 90 tablet 3  . aspirin 81 MG EC tablet Take 81 mg by mouth daily.      Marland Kitchen atorvastatin (LIPITOR) 40 MG tablet Take 1 tablet by mouth  daily 90 tablet 3  . cetirizine (ZYRTEC) 10 MG tablet Take 1 tablet (10 mg total) by mouth daily. 30 tablet 11  . Cholecalciferol (VITAMIN D3) 1000 UNITS CAPS Take 1 capsule (1,000 Units total) by mouth daily. 90 capsule 3  . fesoterodine (TOVIAZ) 4 MG TB24 tablet Take 1 tablet (4 mg total) by mouth daily. 90 tablet 3  . losartan-hydrochlorothiazide (HYZAAR) 100-25 MG tablet Take 1 tablet by  mouth  daily 90 tablet 3  . meclizine (ANTIVERT) 12.5 MG tablet Take 1 tablet (12.5 mg total) by mouth 3 (three) times daily as needed for dizziness. 30 tablet 2  . metFORMIN (GLUCOPHAGE) 500 MG tablet Take 2 tablets by mouth  twice a day with meals 360 tablet 3  . methocarbamol (ROBAXIN) 500 MG tablet Take 1 tablet (500 mg total) by mouth every 6 (six) hours as needed for muscle spasms. 60 tablet 1  . Multiple Vitamins-Minerals (MULTIVITAMIN PO) Take 1 tablet by mouth daily.    Marland Kitchen oxyCODONE-acetaminophen (PERCOCET/ROXICET) 5-325 MG tablet Take 1 tablet by mouth every 4 (four)  hours as needed for moderate pain. 60 tablet 0  . pantoprazole (PROTONIX) 40 MG tablet Take 1 tablet by mouth  daily 90 tablet 3  . sildenafil (VIAGRA) 100 MG tablet Take 0.5-1 tablets (50-100 mg total) by mouth daily as needed for erectile dysfunction. 2 tablet 11  . tamsulosin (FLOMAX) 0.4 MG CAPS capsule Take 1 capsule (0.4 mg total) by mouth daily. 90 capsule 3   No current facility-administered medications on file prior to visit.    Review of Systems  Constitutional: Negative for unusual diaphoresis or night sweats HENT: Negative for ear swelling or discharge Eyes: Negative for worsening visual haziness  Respiratory: Negative for choking and stridor.   Gastrointestinal: Negative for distension or worsening eructation Genitourinary: Negative for retention or change in urine volume.  Musculoskeletal: Negative for other MSK pain or swelling Skin: Negative for color change and worsening wound Neurological: Negative for tremors and numbness other than noted  Psychiatric/Behavioral: Negative for decreased concentration or agitation other than above       Objective:   Physical Exam BP 134/80   Pulse 100   Temp 98 F (36.7 C) (Oral)   Resp 20   Wt 178 lb (80.7 kg)   SpO2 99%   BMI 22.85 kg/m  VS noted,  Constitutional: Pt appears in no apparent distress HENT: Head: NCAT.  Right Ear: External ear normal.  Left Ear: External ear normal.  Eyes: . Pupils are equal, round, and reactive to light. Conjunctivae and EOM are normal Neck: Normal range of motion. Neck supple.  Cardiovascular: Normal rate and regular rhythm.   Pulmonary/Chest: Effort normal and breath sounds without rales or wheezing.  Abd:  Soft, NT, ND, + BS Spine nontender; has bilat lumbar paravertebral and latissimus dorsi area tenderness Neurological: Pt is alert. Not confused , motor 5/5 intact Skin: Skin is warm. No rash, no LE edema Psychiatric: Pt behavior is normal. No agitation.     Assessment & Plan:

## 2015-11-21 NOTE — Progress Notes (Signed)
Pre visit review using our clinic review tool, if applicable. No additional management support is needed unless otherwise documented below in the visit note.

## 2015-11-24 NOTE — Assessment & Plan Note (Signed)
Etiology unclear, suspect flare of possible underlying lumbar djd/ddd with bilat radiation of pain to buttocks, possibly peripheral LE as well, for UA, also gabapentin trial asd,  to f/u any worsening symptoms or concerns

## 2015-11-24 NOTE — Assessment & Plan Note (Signed)
stable overall by history and exam, recent data reviewed with pt, and pt to continue medical treatment as before,  to f/u any worsening symptoms or concerns Lab Results  Component Value Date   HGBA1C 6.5 10/08/2015

## 2015-11-24 NOTE — Assessment & Plan Note (Signed)
stable overall by history and exam, recent data reviewed with pt, and pt to continue medical treatment as before,  to f/u any worsening symptoms or concerns BP Readings from Last 3 Encounters:  11/21/15 134/80  10/31/15 138/60  10/25/15 118/68

## 2015-11-25 DIAGNOSIS — M542 Cervicalgia: Secondary | ICD-10-CM | POA: Diagnosis not present

## 2015-11-28 DIAGNOSIS — M542 Cervicalgia: Secondary | ICD-10-CM | POA: Diagnosis not present

## 2015-12-02 DIAGNOSIS — M542 Cervicalgia: Secondary | ICD-10-CM | POA: Diagnosis not present

## 2015-12-05 DIAGNOSIS — M542 Cervicalgia: Secondary | ICD-10-CM | POA: Diagnosis not present

## 2015-12-12 DIAGNOSIS — M542 Cervicalgia: Secondary | ICD-10-CM | POA: Diagnosis not present

## 2015-12-13 DIAGNOSIS — M542 Cervicalgia: Secondary | ICD-10-CM | POA: Diagnosis not present

## 2015-12-16 ENCOUNTER — Other Ambulatory Visit: Payer: Self-pay | Admitting: Neurological Surgery

## 2015-12-16 DIAGNOSIS — M542 Cervicalgia: Secondary | ICD-10-CM

## 2015-12-20 ENCOUNTER — Ambulatory Visit
Admission: RE | Admit: 2015-12-20 | Discharge: 2015-12-20 | Disposition: A | Discharge status: Home / Self Care | Payer: Medicare Other | Source: Ambulatory Visit | Attending: Neurological Surgery | Admitting: Neurological Surgery

## 2015-12-20 DIAGNOSIS — M4322 Fusion of spine, cervical region: Secondary | ICD-10-CM | POA: Diagnosis not present

## 2015-12-20 DIAGNOSIS — M542 Cervicalgia: Secondary | ICD-10-CM

## 2015-12-26 DIAGNOSIS — M4692 Unspecified inflammatory spondylopathy, cervical region: Secondary | ICD-10-CM | POA: Diagnosis not present

## 2015-12-29 ENCOUNTER — Encounter (HOSPITAL_COMMUNITY): Payer: Self-pay | Admitting: *Deleted

## 2015-12-29 ENCOUNTER — Ambulatory Visit (HOSPITAL_COMMUNITY)
Admission: EM | Admit: 2015-12-29 | Discharge: 2015-12-29 | Disposition: A | Discharge status: Home / Self Care | Payer: Medicare Other | Attending: Family Medicine | Admitting: Family Medicine

## 2015-12-29 DIAGNOSIS — R040 Epistaxis: Secondary | ICD-10-CM

## 2015-12-29 MED ORDER — OXYMETAZOLINE HCL 0.05 % NA SOLN
NASAL | Status: AC
  Filled 2015-12-29: qty 15

## 2015-12-29 NOTE — Discharge Instructions (Signed)
Use humidifier, saline nose spray and bacitracin ointment as needed.

## 2015-12-29 NOTE — ED Provider Notes (Signed)
[place]    CSN: [id number] Arrival date & time: 12/29/15  1540     History   Chief Complaint Chief Complaint  Patient presents with  . Epistaxis    HPI Nicholas Patton is a 81 y.o. male.   The history is provided by the patient and a relative.  Epistaxis  Location:  L nare Severity:  Moderate Duration:  1 hour Progression:  Improving Chronicity:  New Context: aspirin use and hypertension   Context comment:  Smply bent over at home and nose began bleeding on left., unable to stop. Relieved by:  None tried Worsened by:  Nothing Associated symptoms: no blood in oropharynx, no congestion, no dizziness, no fever, no sinus pain and no sore throat     Past Medical History:  Diagnosis Date  . Arthritis   . DIABETES MELLITUS, TYPE II    takes Metformin and Glimepiride daily  . GERD (gastroesophageal reflux disease)    takes Protonix daily  . History of colon polyps   . HYPERLIPIDEMIA    takes Atorvastatin daily  . HYPERTENSION    takes Amlodipine and Hyzaar daily  . Ileus following gastrointestinal surgery 07/22/2012  . Joint pain   . Peripheral neuropathy ([place])   . Peripheral neuropathy ([place])   . PERIPHERAL NEUROPATHY, FEET 10/06/2007  . VITAMIN B12 DEFICIENCY 09/06/2008  . VITAMIN D DEFICIENCY 01/16/2010   takes Vitamin D daily    Patient Active Problem List   Diagnosis Date Noted  . Hematochezia 11/21/2015  . Peripheral neuropathic pain 10/25/2015  . Low back pain 10/08/2015  . CKD (chronic kidney disease) stage 3, GFR 30-59 ml/min 10/08/2015  . S/P cervical spinal fusion 06/27/2015  . Increased prostate specific antigen (PSA) velocity 04/09/2015  . Chest pain 08/14/2014  . Dyspnea 08/14/2014  . Urinary frequency 06/19/2014  . Overactive bladder 09/27/2013  . Onychomycosis 08/08/2013  . Pain in lower limb 05/15/2013  . Diabetic peripheral neuropathy ([place]) 05/15/2013  . Other hammer toe (acquired) 05/15/2013  . HAV (hallux abducto valgus)  05/15/2013  . Blood in mouth of unknown source 02/09/2013  . Vertigo 12/29/2012  . Erectile dysfunction 09/22/2012  . Left ear hearing loss 09/13/2012  . S/P laparoscopic cholecystectomy 08/03/2012  . Neck pain on left side 04/04/2012  . Trapezoid ligament sprain 04/04/2012  . Bladder neck obstruction 10/06/2011  . Cervical radiculitis 09/26/2010  . Preventative health care 09/14/2010  . VITAMIN D DEFICIENCY 01/16/2010  . PERS HX TOBACCO USE PRESENTING HAZARDS HEALTH 09/17/2009  . VITAMIN B12 DEFICIENCY 09/06/2008  . INSOMNIA 06/21/2008  . LEG PAIN, BILATERAL 10/06/2007  . Diabetes ([place]) 01/24/2007  . Hyperlipidemia 01/24/2007  . Allergic rhinitis 01/24/2007  . BPH (benign prostatic hypertrophy) 01/24/2007  . Essential hypertension 10/05/2006  . GERD 10/05/2006    Past Surgical History:  Procedure Laterality Date  . CATARACT EXTRACTION     both eyes  . CHOLECYSTECTOMY N/A 07/18/2012   Procedure: LAPAROSCOPIC CHOLECYSTECTOMY WITH INTRAOPERATIVE CHOLANGIOGRAM;  Surgeon: Zenovia Jarred, MD;  Location: [place] [facility];  Service: General;  Laterality: N/A;  . COLONOSCOPY    . POSTERIOR CERVICAL FUSION/FORAMINOTOMY N/A 06/27/2015   Procedure: Posterior Cervical Fusion with lateral mass fixation Cervical four to cervical seven;  Surgeon: Eustace Moore, MD;  Location: [place] [facility];  Service: Neurosurgery;  Laterality: N/A;  . TOTAL KNEE ARTHROPLASTY  2001   Left  . TURP VAPORIZATION         Home Medications    Prior to Admission medications  Medication Sig Start Date End Date Taking? Authorizing Provider  amitriptyline (ELAVIL) 50 MG tablet Take 1 tablet (50 mg total) by mouth at bedtime. 10/25/15   [name], MD  amLODipine (NORVASC) 5 MG tablet Take 1 tablet by mouth  daily 04/15/15   [name], MD  aspirin 81 MG EC tablet Take 81 mg by mouth daily.      Historical Provider, MD  atorvastatin (LIPITOR) 40 MG tablet Take 1 tablet by mouth  daily 04/15/15   [name], MD    cetirizine (ZYRTEC) 10 MG tablet Take 1 tablet (10 mg total) by mouth daily. 04/09/15   [name], MD  Cholecalciferol (VITAMIN D3) 1000 UNITS CAPS Take 1 capsule (1,000 Units total) by mouth daily. 03/22/13   [name], MD  fesoterodine (TOVIAZ) 4 MG TB24 tablet Take 1 tablet (4 mg total) by mouth daily. 05/02/15   [name], MD  gabapentin (NEURONTIN) 100 MG capsule Take 1 capsule (100 mg total) by mouth 3 (three) times daily. For 1 week to start 11/21/15   [name], MD  gabapentin (NEURONTIN) 300 MG capsule Take 1 capsule (300 mg total) by mouth 3 (three) times daily. To start after 1 wk at 100 mg dosing 11/21/15   [name], MD  losartan-hydrochlorothiazide [facility]) 100-25 MG tablet Take 1 tablet by mouth  daily 04/15/15   [name], MD  meclizine (ANTIVERT) 12.5 MG tablet Take 1 tablet (12.5 mg total) by mouth 3 (three) times daily as needed for dizziness. 10/08/15 10/07/16  [name], MD  metFORMIN (GLUCOPHAGE) 500 MG tablet Take 2 tablets by mouth  twice a day with meals 04/15/15   [name], MD  methocarbamol (ROBAXIN) 500 MG tablet Take 1 tablet (500 mg total) by mouth every 6 (six) hours as needed for muscle spasms. 06/28/15   Eustace Moore, MD  Multiple Vitamins-Minerals (MULTIVITAMIN PO) Take 1 tablet by mouth daily.    Historical Provider, MD  oxyCODONE-acetaminophen (PERCOCET/ROXICET) 5-325 MG tablet Take 1 tablet by mouth every 4 (four) hours as needed for moderate pain. 06/28/15   Eustace Moore, MD  pantoprazole (PROTONIX) 40 MG tablet Take 1 tablet by mouth  daily 04/15/15   [name], MD  sildenafil (VIAGRA) 100 MG tablet Take 0.5-1 tablets (50-100 mg total) by mouth daily as needed for erectile dysfunction. 04/09/15   [name], MD  tamsulosin (FLOMAX) 0.4 MG CAPS capsule Take 1 capsule (0.4 mg total) by mouth daily. 10/08/15   [name], MD    Family History Family History  Problem Relation Age of Onset  . Diabetes Mellitus II Mother   . Lung cancer Brother    . Colon cancer Neg Hx   . Esophageal cancer Neg Hx   . Rectal cancer Neg Hx   . Stomach cancer Neg Hx     Social History Social History  Substance Use Topics  . Smoking status: Former Smoker    Packs/day: 0.00    Types: Cigarettes  . Smokeless tobacco: Never Used     Comment: quit smoking around 1990  . Alcohol use No     Allergies   Review of patient's allergies indicates no known allergies.   Review of Systems Review of Systems  Constitutional: Negative.  Negative for fever.  HENT: Positive for nosebleeds. Negative for congestion and sore throat.   Neurological: Negative for dizziness.  All other systems reviewed and are negative.  Physical Exam Triage Vital Signs ED Triage Vitals [12/29/15 1555]  Enc Vitals Group     BP 180/96     Pulse Rate 78     Resp 18     Temp 98.6 F (37 C)     Temp src      SpO2 100 %     Weight      Height      Head Circumference      Peak Flow      Pain Score      Pain Loc      Pain Edu?      Excl. in [place]?    No data found.   Updated Vital Signs BP 180/96 (BP Location: Right Arm)   Pulse 78   Temp 98.6 F (37 C)   Resp 18   SpO2 100%   Visual Acuity Right Eye Distance:   Left Eye Distance:   Bilateral Distance:    Right Eye Near:   Left Eye Near:    Bilateral Near:     Physical Exam  Constitutional: He is oriented to person, place, and time. He appears well-developed and well-nourished. No distress.  HENT:  Nose: No rhinorrhea or sinus tenderness. Epistaxis is observed.  Eyes: EOM are normal. Pupils are equal, round, and reactive to light.  Neck: Normal range of motion. Neck supple.  Lymphadenopathy:    He has no cervical adenopathy.  Neurological: He is alert and oriented to person, place, and time.  Skin: Skin is warm and dry.  Nursing note and vitals reviewed.    UC Treatments / Results  Labs (all labs ordered are listed, but only abnormal results are displayed) Labs Reviewed - No data to  display  EKG  EKG Interpretation None       Radiology No results found.  Procedures Procedures (including critical care time)  Medications Ordered in UC Medications - No data to display   Initial Impression / Assessment and Plan / UC Course  I have reviewed the triage vital signs and the nursing notes.  Pertinent labs & imaging results that were available during my care of the patient were reviewed by me and considered in my medical decision making (see chart for details).  Clinical Course    After afrin and rest,, nares were clear and post pharynx clear , sx resolved at d/c. Bacitracin applied to left nares.  Final Clinical Impressions(s) / UC Diagnoses   Final diagnoses:  None    New Prescriptions New Prescriptions   No medications on file     Billy Fischer, MD 12/29/15 1651

## 2015-12-29 NOTE — ED Notes (Signed)
Bleeding  Has   Subsided   Pt is  Sitting  Upright  On the  Exam table  In  No  Acute  distress

## 2015-12-29 NOTE — ED Triage Notes (Signed)
Pt  Reports  A  nosebleed  That  Started  Today      Pt  Is  Not on  Any  Blood  Thinners      denys  Any injury         No  History  Of  Recent  nosebleeds

## 2016-01-08 ENCOUNTER — Encounter (HOSPITAL_COMMUNITY): Payer: Self-pay | Admitting: *Deleted

## 2016-01-08 ENCOUNTER — Emergency Department (HOSPITAL_COMMUNITY)
Admission: EM | Admit: 2016-01-08 | Discharge: 2016-01-08 | Disposition: A | Discharge status: Home / Self Care | Payer: Medicare Other | Attending: Emergency Medicine | Admitting: Emergency Medicine

## 2016-01-08 DIAGNOSIS — Z7984 Long term (current) use of oral hypoglycemic drugs: Secondary | ICD-10-CM | POA: Insufficient documentation

## 2016-01-08 DIAGNOSIS — Z87891 Personal history of nicotine dependence: Secondary | ICD-10-CM | POA: Insufficient documentation

## 2016-01-08 DIAGNOSIS — R040 Epistaxis: Secondary | ICD-10-CM | POA: Diagnosis not present

## 2016-01-08 DIAGNOSIS — E1140 Type 2 diabetes mellitus with diabetic neuropathy, unspecified: Secondary | ICD-10-CM | POA: Insufficient documentation

## 2016-01-08 DIAGNOSIS — Z96652 Presence of left artificial knee joint: Secondary | ICD-10-CM | POA: Diagnosis not present

## 2016-01-08 DIAGNOSIS — N183 Chronic kidney disease, stage 3 (moderate): Secondary | ICD-10-CM | POA: Insufficient documentation

## 2016-01-08 DIAGNOSIS — E1122 Type 2 diabetes mellitus with diabetic chronic kidney disease: Secondary | ICD-10-CM | POA: Diagnosis not present

## 2016-01-08 DIAGNOSIS — Z7982 Long term (current) use of aspirin: Secondary | ICD-10-CM | POA: Diagnosis not present

## 2016-01-08 LAB — CBC WITH DIFFERENTIAL/PLATELET
Basophils Absolute: 0 10*3/uL (ref 0.0–0.1)
Basophils Relative: 1 %
Eosinophils Absolute: 0.1 10*3/uL (ref 0.0–0.7)
Eosinophils Relative: 1 %
HCT: 39.8 % (ref 39.0–52.0)
Hemoglobin: 12.5 g/dL — ABNORMAL LOW (ref 13.0–17.0)
Lymphocytes Relative: 19 %
Lymphs Abs: 0.9 10*3/uL (ref 0.7–4.0)
MCH: 27.4 pg (ref 26.0–34.0)
MCHC: 31.4 g/dL (ref 30.0–36.0)
MCV: 87.1 fL (ref 78.0–100.0)
Monocytes Absolute: 0.3 10*3/uL (ref 0.1–1.0)
Monocytes Relative: 7 %
Neutro Abs: 3.4 10*3/uL (ref 1.7–7.7)
Neutrophils Relative %: 72 %
Platelets: 223 10*3/uL (ref 150–400)
RBC: 4.57 MIL/uL (ref 4.22–5.81)
RDW: 14.5 % (ref 11.5–15.5)
WBC: 4.6 10*3/uL (ref 4.0–10.5)

## 2016-01-08 MED ORDER — PHENYLEPHRINE HCL 0.5 % NA SOLN
1.0000 [drp] | Freq: Once | NASAL | Status: AC
  Administered 2016-01-08: 1 [drp] via NASAL
  Filled 2016-01-08: qty 15

## 2016-01-08 MED ORDER — OXYMETAZOLINE HCL 0.05 % NA SOLN: 1.0000 | Freq: Once | NASAL | Status: DC

## 2016-01-08 NOTE — ED Triage Notes (Signed)
Pt reports having moderate nosebleed x 1 hour. Recent hx of same and was seen at [facility] on 10/22. Denies being on blood thinners.

## 2016-01-08 NOTE — ED Provider Notes (Signed)
[facility] DEPT Provider Note   CSN: [id number] Arrival date & time: 01/08/16  1059     History   Chief Complaint Chief Complaint  Patient presents with  . Epistaxis    HPI Nicholas Patton is a 81 y.o. male.  HPI  81 year old male who presents with nose bleed, started 1 hour prior to arrival.History of nosebleed 2 weeks ago, seen at [facility]. Given afrin and bacitracin ointment. Out of afrin now. Bleeding from left nares today while at rest. Take baby aspirin. No trauma or fall, no syncope near syncope, chest pain or dyspnea. Bleeding now stopped after ED arrival without intervention.  Past Medical History:  Diagnosis Date  . Arthritis   . DIABETES MELLITUS, TYPE II    takes Metformin and Glimepiride daily  . GERD (gastroesophageal reflux disease)    takes Protonix daily  . History of colon polyps   . HYPERLIPIDEMIA    takes Atorvastatin daily  . HYPERTENSION    takes Amlodipine and Hyzaar daily  . Ileus following gastrointestinal surgery 07/22/2012  . Joint pain   . Peripheral neuropathy ([place])   . Peripheral neuropathy ([place])   . PERIPHERAL NEUROPATHY, FEET 10/06/2007  . VITAMIN B12 DEFICIENCY 09/06/2008  . VITAMIN D DEFICIENCY 01/16/2010   takes Vitamin D daily    Patient Active Problem List   Diagnosis Date Noted  . Hematochezia 11/21/2015  . Peripheral neuropathic pain 10/25/2015  . Low back pain 10/08/2015  . CKD (chronic kidney disease) stage 3, GFR 30-59 ml/min 10/08/2015  . S/P cervical spinal fusion 06/27/2015  . Increased prostate specific antigen (PSA) velocity 04/09/2015  . Chest pain 08/14/2014  . Dyspnea 08/14/2014  . Urinary frequency 06/19/2014  . Overactive bladder 09/27/2013  . Onychomycosis 08/08/2013  . Pain in lower limb 05/15/2013  . Diabetic peripheral neuropathy ([place]) 05/15/2013  . Other hammer toe (acquired) 05/15/2013  . HAV (hallux abducto valgus) 05/15/2013  . Blood in mouth of unknown source 02/09/2013  . Vertigo 12/29/2012  .  Erectile dysfunction 09/22/2012  . Left ear hearing loss 09/13/2012  . S/P laparoscopic cholecystectomy 08/03/2012  . Neck pain on left side 04/04/2012  . Trapezoid ligament sprain 04/04/2012  . Bladder neck obstruction 10/06/2011  . Cervical radiculitis 09/26/2010  . Preventative health care 09/14/2010  . VITAMIN D DEFICIENCY 01/16/2010  . PERS HX TOBACCO USE PRESENTING HAZARDS HEALTH 09/17/2009  . VITAMIN B12 DEFICIENCY 09/06/2008  . INSOMNIA 06/21/2008  . LEG PAIN, BILATERAL 10/06/2007  . Diabetes ([place]) 01/24/2007  . Hyperlipidemia 01/24/2007  . Allergic rhinitis 01/24/2007  . BPH (benign prostatic hypertrophy) 01/24/2007  . Essential hypertension 10/05/2006  . GERD 10/05/2006    Past Surgical History:  Procedure Laterality Date  . CATARACT EXTRACTION     both eyes  . CHOLECYSTECTOMY N/A 07/18/2012   Procedure: LAPAROSCOPIC CHOLECYSTECTOMY WITH INTRAOPERATIVE CHOLANGIOGRAM;  Surgeon: Zenovia Jarred, MD;  Location: [place];  Service: General;  Laterality: N/A;  . COLONOSCOPY    . POSTERIOR CERVICAL FUSION/FORAMINOTOMY N/A 06/27/2015   Procedure: Posterior Cervical Fusion with lateral mass fixation Cervical four to cervical seven;  Surgeon: Eustace Moore, MD;  Location: [place] [facility];  Service: Neurosurgery;  Laterality: N/A;  . TOTAL KNEE ARTHROPLASTY  2001   Left  . TURP VAPORIZATION         Home Medications    Prior to Admission medications   Medication Sig Start Date End Date Taking? Authorizing Provider  amitriptyline (ELAVIL) 50 MG tablet Take 1 tablet (50  mg total) by mouth at bedtime. 10/25/15   [name], MD  amLODipine (NORVASC) 5 MG tablet Take 1 tablet by mouth  daily 04/15/15   [name], MD  aspirin 81 MG EC tablet Take 81 mg by mouth daily.      Historical Provider, MD  atorvastatin (LIPITOR) 40 MG tablet Take 1 tablet by mouth  daily 04/15/15   [name], MD  cetirizine (ZYRTEC) 10 MG tablet Take 1 tablet (10 mg total) by mouth daily. 04/09/15   [name], MD  Cholecalciferol (VITAMIN D3) 1000 UNITS CAPS Take 1 capsule (1,000 Units total) by mouth daily. 03/22/13   [name], MD  fesoterodine (TOVIAZ) 4 MG TB24 tablet Take 1 tablet (4 mg total) by mouth daily. 05/02/15   [name], MD  gabapentin (NEURONTIN) 100 MG capsule Take 1 capsule (100 mg total) by mouth 3 (three) times daily. For 1 week to start 11/21/15   [name], MD  gabapentin (NEURONTIN) 300 MG capsule Take 1 capsule (300 mg total) by mouth 3 (three) times daily. To start after 1 wk at 100 mg dosing 11/21/15   [name], MD  losartan-hydrochlorothiazide [facility]) 100-25 MG tablet Take 1 tablet by mouth  daily 04/15/15   [name], MD  meclizine (ANTIVERT) 12.5 MG tablet Take 1 tablet (12.5 mg total) by mouth 3 (three) times daily as needed for dizziness. 10/08/15 10/07/16  [name], MD  metFORMIN (GLUCOPHAGE) 500 MG tablet Take 2 tablets by mouth  twice a day with meals 04/15/15   [name], MD  methocarbamol (ROBAXIN) 500 MG tablet Take 1 tablet (500 mg total) by mouth every 6 (six) hours as needed for muscle spasms. 06/28/15   Eustace Moore, MD  Multiple Vitamins-Minerals (MULTIVITAMIN PO) Take 1 tablet by mouth daily.    Historical Provider, MD  oxyCODONE-acetaminophen (PERCOCET/ROXICET) 5-325 MG tablet Take 1 tablet by mouth every 4 (four) hours as needed for moderate pain. 06/28/15   Eustace Moore, MD  pantoprazole (PROTONIX) 40 MG tablet Take 1 tablet by mouth  daily 04/15/15   [name], MD  sildenafil (VIAGRA) 100 MG tablet Take 0.5-1 tablets (50-100 mg total) by mouth daily as needed for erectile dysfunction. 04/09/15   [name], MD  tamsulosin (FLOMAX) 0.4 MG CAPS capsule Take 1 capsule (0.4 mg total) by mouth daily. 10/08/15   [name], MD    Family History Family History  Problem Relation Age of Onset  . Diabetes Mellitus II Mother   . Lung cancer Brother   . Colon cancer Neg Hx   . Esophageal cancer Neg Hx   . Rectal cancer Neg Hx   . Stomach cancer  Neg Hx     Social History Social History  Substance Use Topics  . Smoking status: Former Smoker    Packs/day: 0.00    Types: Cigarettes  . Smokeless tobacco: Never Used     Comment: quit smoking around 1990  . Alcohol use No     Allergies   Review of patient's allergies indicates no known allergies.   Review of Systems Review of Systems 10/14 systems reviewed and are negative other than those stated in the HPI   Physical Exam Updated Vital Signs BP 172/84 (BP Location: Left Arm)   Pulse 114   Temp 97.9 F (36.6 C) (Oral)   Resp 16   SpO2 98%   Physical Exam Physical Exam  Nursing note and vitals reviewed. Constitutional: Well developed, well nourished, non-toxic, and in no acute distress Head: Normocephalic and atraumatic.  Mouth/Throat: Oropharynx is clear and moist.  Neck: Normal range of motion. Neck supple.  Nose: stigmata of bleeding from left nares with clot Cardiovascular: Normal rate and regular rhythm.   Pulmonary/Chest: Effort normal and breath sounds normal.  Abdominal: Soft. There is no tenderness. There is no rebound and no guarding.  Musculoskeletal: Normal range of motion.  Neurological: Alert, no facial droop, fluent speech, moves all extremities symmetrically Skin: Skin is warm and dry.  Psychiatric: Cooperative   ED Treatments / Results  Labs (all labs ordered are listed, but only abnormal results are displayed) Labs Reviewed  CBC WITH DIFFERENTIAL/PLATELET    EKG  EKG Interpretation None       Radiology No results found.  Procedures Procedures (including critical care time)  Medications Ordered in ED Medications  phenylephrine (NEO-SYNEPHRINE) 0.5 % nasal solution 1 drop (not administered)     Initial Impression / Assessment and Plan / ED Course  I have reviewed the triage vital signs and the nursing notes.  Pertinent labs & imaging results that were available during my care of the patient were reviewed by me and  considered in my medical decision making (see chart for details).  Clinical Course   Presenting with epistaxis, now resolved. Evidence of prior bleeding from left anterior nares. Given neosynphrine for home, and discussed supportive management for recurrent of bleeding. Hgb stable and vital signs normal. Appropriate for discharge home. Discussed return to ED for recurrent bleeding not resolved by pressure and neosynephrine at home.   The patient appears reasonably screened and/or stabilized for discharge and I doubt any other medical condition or other [facility] requiring further screening, evaluation, or treatment in the ED at this time prior to discharge.   Final Clinical Impressions(s) / ED Diagnoses   Final diagnoses:  None    New Prescriptions New Prescriptions   No medications on file     Forde Dandy, MD 01/08/16 2009

## 2016-01-08 NOTE — Discharge Instructions (Signed)
If you have recurrent nosebleed, clear blood from your nostrils, give yourself a spray of neosynephrine or afrin, and hold deep pressure for 15-20 minutes (w/o peeking). If still bleeding, repeat neosynephrine or afrin, and repeat pressure for 15-20 minute. Can repeat one more time. But if persistent bleeding after 1 hour, return to ED for re-evaluation.   Return for worsening symptoms, including passing out, difficulty breathing, chest pains, or any other symptoms concerning to you.  Use bacitracin ointment in nose daily to keep nose lubricated.

## 2016-01-10 ENCOUNTER — Ambulatory Visit (INDEPENDENT_AMBULATORY_CARE_PROVIDER_SITE_OTHER): Payer: Medicare Other

## 2016-01-10 DIAGNOSIS — Z23 Encounter for immunization: Secondary | ICD-10-CM | POA: Diagnosis not present

## 2016-01-13 ENCOUNTER — Encounter: Payer: Self-pay | Admitting: Internal Medicine

## 2016-01-13 ENCOUNTER — Ambulatory Visit (INDEPENDENT_AMBULATORY_CARE_PROVIDER_SITE_OTHER): Payer: Medicare Other | Admitting: Internal Medicine

## 2016-01-13 DIAGNOSIS — R040 Epistaxis: Secondary | ICD-10-CM | POA: Diagnosis not present

## 2016-01-13 DIAGNOSIS — J309 Allergic rhinitis, unspecified: Secondary | ICD-10-CM

## 2016-01-13 DIAGNOSIS — I10 Essential (primary) hypertension: Secondary | ICD-10-CM

## 2016-01-13 DIAGNOSIS — J069 Acute upper respiratory infection, unspecified: Secondary | ICD-10-CM

## 2016-01-13 MED ORDER — HYDROCODONE-HOMATROPINE 5-1.5 MG/5ML PO SYRP: 5.0000 mL | ORAL_SOLUTION | Freq: Four times a day (QID) | ORAL | 0 refills | Status: AC | PRN

## 2016-01-13 MED ORDER — AZITHROMYCIN 250 MG PO TABS: ORAL_TABLET | ORAL | 1 refills | Status: DC

## 2016-01-13 MED ORDER — FEXOFENADINE HCL 180 MG PO TABS: 180.0000 mg | ORAL_TABLET | Freq: Every day | ORAL | 2 refills | Status: DC

## 2016-01-13 NOTE — Progress Notes (Signed)
Pre visit review using our clinic review tool, if applicable. No additional management support is needed unless otherwise documented below in the visit note.

## 2016-01-13 NOTE — Assessment & Plan Note (Signed)
Mild to mod, for change to alelgra otc prn,  to f/u any worsening symptoms or concerns

## 2016-01-13 NOTE — Assessment & Plan Note (Signed)
stable overall by history and exam, recent data reviewed with pt, and pt to continue medical treatment as before,  to f/u any worsening symptoms or concerns BP Readings from Last 3 Encounters:  01/13/16 134/68  01/08/16 140/68  12/29/15 152/76

## 2016-01-13 NOTE — Assessment & Plan Note (Signed)
None recurrent, last nosebleed prior just 2 wks, for ENT if recurs.

## 2016-01-13 NOTE — Assessment & Plan Note (Signed)
Mild to mod, for antibx course,  Cough med prn,to f/u any worsening symptoms or concerns

## 2016-01-13 NOTE — Progress Notes (Signed)
Subjective:    Patient ID: Nicholas Patton, male    DOB: Jan 08, 1935, 81 y.o.   MRN: [id number]  HPI  Here after recent nosebleed, seen at ED, without further bleed since then.  Has worsened however with sinus pain and mild congestion, feels warm but no high temps, but with 3-4 days worsening dry cough and hoarseness, without ST, and Pt denies chest pain, increased sob or doe, wheezing, orthopnea, PND, increased LE swelling, palpitations, dizziness or syncope.  Tried OTC claritin but no help. Does have several wks ongoing nasal allergy symptoms with clearish congestion, itch and sneezing, without fever, pain, ST, cough, swelling or wheezing. Had a prior left side nosebleed 2 wks ago Past Medical History:  Diagnosis Date  . Arthritis   . DIABETES MELLITUS, TYPE II    takes Metformin and Glimepiride daily  . GERD (gastroesophageal reflux disease)    takes Protonix daily  . History of colon polyps   . HYPERLIPIDEMIA    takes Atorvastatin daily  . HYPERTENSION    takes Amlodipine and Hyzaar daily  . Ileus following gastrointestinal surgery 07/22/2012  . Joint pain   . Peripheral neuropathy ([place])   . Peripheral neuropathy ([place])   . PERIPHERAL NEUROPATHY, FEET 10/06/2007  . VITAMIN B12 DEFICIENCY 09/06/2008  . VITAMIN D DEFICIENCY 01/16/2010   takes Vitamin D daily   Past Surgical History:  Procedure Laterality Date  . CATARACT EXTRACTION     both eyes  . CHOLECYSTECTOMY N/A 07/18/2012   Procedure: LAPAROSCOPIC CHOLECYSTECTOMY WITH INTRAOPERATIVE CHOLANGIOGRAM;  Surgeon: Zenovia Jarred, MD;  Location: [facility];  Service: General;  Laterality: N/A;  . COLONOSCOPY    . POSTERIOR CERVICAL FUSION/FORAMINOTOMY N/A 06/27/2015   Procedure: Posterior Cervical Fusion with lateral mass fixation Cervical four to cervical seven;  Surgeon: Eustace Moore, MD;  Location: [facility];  Service: Neurosurgery;  Laterality: N/A;  . TOTAL KNEE ARTHROPLASTY  2001   Left  . TURP VAPORIZATION      reports that  he has quit smoking. His smoking use included Cigarettes. He smoked 0.00 packs per day. He has never used smokeless tobacco. He reports that he does not drink alcohol or use drugs. family history includes Diabetes Mellitus II in his mother; Lung cancer in his brother. No Known Allergies Current Outpatient Prescriptions on File Prior to Visit  Medication Sig Dispense Refill  . amitriptyline (ELAVIL) 50 MG tablet Take 1 tablet (50 mg total) by mouth at bedtime. 90 tablet 1  . amLODipine (NORVASC) 5 MG tablet Take 1 tablet by mouth  daily 90 tablet 3  . aspirin 81 MG EC tablet Take 81 mg by mouth daily.      Marland Kitchen atorvastatin (LIPITOR) 40 MG tablet Take 1 tablet by mouth  daily 90 tablet 3  . cetirizine (ZYRTEC) 10 MG tablet Take 1 tablet (10 mg total) by mouth daily. 30 tablet 11  . Cholecalciferol (VITAMIN D3) 1000 UNITS CAPS Take 1 capsule (1,000 Units total) by mouth daily. 90 capsule 3  . fesoterodine (TOVIAZ) 4 MG TB24 tablet Take 1 tablet (4 mg total) by mouth daily. 90 tablet 3  . gabapentin (NEURONTIN) 100 MG capsule Take 1 capsule (100 mg total) by mouth 3 (three) times daily. For 1 week to start 21 capsule 0  . gabapentin (NEURONTIN) 300 MG capsule Take 1 capsule (300 mg total) by mouth 3 (three) times daily. To start after 1 wk at 100 mg dosing 90 capsule 3  . losartan-hydrochlorothiazide (HYZAAR)  100-25 MG tablet Take 1 tablet by mouth  daily 90 tablet 3  . meclizine (ANTIVERT) 12.5 MG tablet Take 1 tablet (12.5 mg total) by mouth 3 (three) times daily as needed for dizziness. 30 tablet 2  . metFORMIN (GLUCOPHAGE) 500 MG tablet Take 2 tablets by mouth  twice a day with meals 360 tablet 3  . methocarbamol (ROBAXIN) 500 MG tablet Take 1 tablet (500 mg total) by mouth every 6 (six) hours as needed for muscle spasms. 60 tablet 1  . Multiple Vitamins-Minerals (MULTIVITAMIN PO) Take 1 tablet by mouth daily.    Marland Kitchen oxyCODONE-acetaminophen (PERCOCET/ROXICET) 5-325 MG tablet Take 1 tablet by mouth  every 4 (four) hours as needed for moderate pain. 60 tablet 0  . pantoprazole (PROTONIX) 40 MG tablet Take 1 tablet by mouth  daily 90 tablet 3  . sildenafil (VIAGRA) 100 MG tablet Take 0.5-1 tablets (50-100 mg total) by mouth daily as needed for erectile dysfunction. 2 tablet 11  . tamsulosin (FLOMAX) 0.4 MG CAPS capsule Take 1 capsule (0.4 mg total) by mouth daily. 90 capsule 3   No current facility-administered medications on file prior to visit.    Review of Systems  Constitutional: Negative for unusual diaphoresis or night sweats HENT: Negative for ear swelling or discharge Eyes: Negative for worsening visual haziness  Respiratory: Negative for choking and stridor.   Gastrointestinal: Negative for distension or worsening eructation Genitourinary: Negative for retention or change in urine volume.  Musculoskeletal: Negative for other MSK pain or swelling Skin: Negative for color change and worsening wound Neurological: Negative for tremors and numbness other than noted  Psychiatric/Behavioral: Negative for decreased concentration or agitation other than above   All other system non contributory    Objective:   Physical Exam BP 134/68   Pulse 85   Temp 98.8 F (37.1 C) (Oral)   Resp 20   Wt 179 lb (81.2 kg)   SpO2 91%   BMI 22.98 kg/m  VS noted,  Constitutional: Pt appears in no apparent distress HENT: Head: NCAT.  Right Ear: External ear normal.  Left Ear: External ear normal.  Eyes: . Pupils are equal, round, and reactive to light. Conjunctivae and EOM are normal Bilat tm's with mild erythema.  Max sinus areas non tender.  Pharynx with mild erythema, no exudate Neck: Normal range of motion. Neck supple.  Cardiovascular: Normal rate and regular rhythm.   Pulmonary/Chest: Effort normal and breath sounds decrased without rales or wheezing.  Neurological: Pt is alert. Not confused , motor grossly intact Skin: Skin is warm. No rash, no LE edema Psychiatric: Pt behavior is  normal. No agitation.  No other new exam findings    Assessment & Plan:

## 2016-01-13 NOTE — Patient Instructions (Signed)
Please take all new medication as prescribed - the antibiotic, and cough medicine  Please also consider taking allegra 180 mg per day for the allergies  You can also take Mucinex (or it's generic off brand) for congestion, and tylenol as needed for pain.  Please continue all other medications as before, and refills have been done if requested.  Please have the pharmacy call with any other refills you may need.  Please keep your appointments with your specialists as you may have planned

## 2016-01-21 ENCOUNTER — Encounter: Payer: Self-pay | Admitting: *Deleted

## 2016-01-21 DIAGNOSIS — M47812 Spondylosis without myelopathy or radiculopathy, cervical region: Secondary | ICD-10-CM | POA: Diagnosis not present

## 2016-01-29 ENCOUNTER — Encounter: Payer: Self-pay | Admitting: Podiatry

## 2016-01-29 ENCOUNTER — Ambulatory Visit (INDEPENDENT_AMBULATORY_CARE_PROVIDER_SITE_OTHER): Payer: Medicare Other | Admitting: Podiatry

## 2016-01-29 VITALS — BP 131/52 | HR 71

## 2016-01-29 DIAGNOSIS — M79606 Pain in leg, unspecified: Secondary | ICD-10-CM

## 2016-01-29 DIAGNOSIS — E1142 Type 2 diabetes mellitus with diabetic polyneuropathy: Secondary | ICD-10-CM

## 2016-01-29 DIAGNOSIS — B351 Tinea unguium: Secondary | ICD-10-CM

## 2016-01-29 NOTE — Patient Instructions (Signed)
Seen for hypertrophic nails. All nails debrided. Return in 3 months or as needed.  

## 2016-01-29 NOTE — Progress Notes (Signed)
Subjective:  81 year old diabetic male presents requesting toe nails and calluses trimmed.  Calluses and toe nails are painful. Blood sugar is under control.   Objective: Dermatologic: Thick mycotic nails x 10.  Plantar callus under the MPJ right.  No open lesions. No edema or erythema.  Inter digital keratoma 1st and 2nd digit at contact surface left foot.  Vascular: All pedal pulses are palpable. Neurologic: All epicritic and tactile sensations grossly intact. Severe hallux valgus with bunion bilateral.  Severely contracted 2nd digit left.   Assessment: Diabetic neuropathy.  Hallux valgus with bunion bilateral.  Hammer toe deformity 2nd left.  Plantar and digital calluses, painful. Mycotic nails x 10.   Plan: Debrided all corns and calluses and nails.  Patient will return in 3 months for palliation

## 2016-02-11 DIAGNOSIS — R040 Epistaxis: Secondary | ICD-10-CM | POA: Diagnosis not present

## 2016-02-16 ENCOUNTER — Encounter (HOSPITAL_COMMUNITY): Payer: Self-pay | Admitting: *Deleted

## 2016-02-16 ENCOUNTER — Emergency Department (HOSPITAL_COMMUNITY)
Admission: EM | Admit: 2016-02-16 | Discharge: 2016-02-16 | Disposition: A | Discharge status: Home / Self Care | Payer: Medicare Other | Attending: Emergency Medicine | Admitting: Emergency Medicine

## 2016-02-16 DIAGNOSIS — Z79899 Other long term (current) drug therapy: Secondary | ICD-10-CM | POA: Diagnosis not present

## 2016-02-16 DIAGNOSIS — R040 Epistaxis: Secondary | ICD-10-CM | POA: Diagnosis present

## 2016-02-16 DIAGNOSIS — Z7982 Long term (current) use of aspirin: Secondary | ICD-10-CM | POA: Diagnosis not present

## 2016-02-16 DIAGNOSIS — E1140 Type 2 diabetes mellitus with diabetic neuropathy, unspecified: Secondary | ICD-10-CM | POA: Insufficient documentation

## 2016-02-16 DIAGNOSIS — I129 Hypertensive chronic kidney disease with stage 1 through stage 4 chronic kidney disease, or unspecified chronic kidney disease: Secondary | ICD-10-CM | POA: Diagnosis not present

## 2016-02-16 DIAGNOSIS — E1122 Type 2 diabetes mellitus with diabetic chronic kidney disease: Secondary | ICD-10-CM | POA: Diagnosis not present

## 2016-02-16 DIAGNOSIS — N183 Chronic kidney disease, stage 3 (moderate): Secondary | ICD-10-CM | POA: Insufficient documentation

## 2016-02-16 DIAGNOSIS — Z7984 Long term (current) use of oral hypoglycemic drugs: Secondary | ICD-10-CM | POA: Diagnosis not present

## 2016-02-16 DIAGNOSIS — Z96652 Presence of left artificial knee joint: Secondary | ICD-10-CM | POA: Diagnosis not present

## 2016-02-16 DIAGNOSIS — Z87891 Personal history of nicotine dependence: Secondary | ICD-10-CM | POA: Insufficient documentation

## 2016-02-16 MED ORDER — ACETAMINOPHEN 325 MG PO TABS
650.0000 mg | ORAL_TABLET | Freq: Once | ORAL | Status: AC
  Administered 2016-02-16: 650 mg via ORAL
  Filled 2016-02-16: qty 2

## 2016-02-16 NOTE — ED Provider Notes (Signed)
[facility] DEPT Provider Note   CSN: [id number] Arrival date & time: 02/16/16  9702     History   Chief Complaint Chief Complaint  Patient presents with  . Epistaxis    HPI Nicholas Patton is a 81 y.o. male.  81 year old male presents with acute onset of epistaxis today. Was seen by his ENT Dr. this week and had the left anterior septum cauterized. States that the days that he noted bright red blood from the left nose and possibly the right side. He had some bleeding in the back of his throat and applied direct pressure which is slowed on his symptoms. Denies any trauma to his nose. Has had recurrent nosebleeds past.      Past Medical History:  Diagnosis Date  . Arthritis   . DIABETES MELLITUS, TYPE II    takes Metformin and Glimepiride daily  . Diverticulosis   . GERD (gastroesophageal reflux disease)    takes Protonix daily  . History of colon polyps   . HYPERLIPIDEMIA    takes Atorvastatin daily  . HYPERTENSION    takes Amlodipine and Hyzaar daily  . Ileus following gastrointestinal surgery 07/22/2012  . Joint pain   . Peripheral neuropathy ([place])   . Peripheral neuropathy ([place])   . PERIPHERAL NEUROPATHY, FEET 10/06/2007  . VITAMIN B12 DEFICIENCY 09/06/2008  . VITAMIN D DEFICIENCY 01/16/2010   takes Vitamin D daily    Patient Active Problem List   Diagnosis Date Noted  . Left-sided nosebleed 01/13/2016  . Hematochezia 11/21/2015  . Peripheral neuropathic pain 10/25/2015  . Low back pain 10/08/2015  . CKD (chronic kidney disease) stage 3, GFR 30-59 ml/min 10/08/2015  . S/P cervical spinal fusion 06/27/2015  . Increased prostate specific antigen (PSA) velocity 04/09/2015  . Chest pain 08/14/2014  . Dyspnea 08/14/2014  . Urinary frequency 06/19/2014  . Overactive bladder 09/27/2013  . Onychomycosis 08/08/2013  . Pain in lower limb 05/15/2013  . Diabetic peripheral neuropathy ([place]) 05/15/2013  . Other hammer toe (acquired) 05/15/2013  . HAV (hallux  abducto valgus) 05/15/2013  . Blood in mouth of unknown source 02/09/2013  . Vertigo 12/29/2012  . Acute upper respiratory infection 12/29/2012  . Erectile dysfunction 09/22/2012  . Left ear hearing loss 09/13/2012  . S/P laparoscopic cholecystectomy 08/03/2012  . Neck pain on left side 04/04/2012  . Trapezoid ligament sprain 04/04/2012  . Bladder neck obstruction 10/06/2011  . Cervical radiculitis 09/26/2010  . Preventative health care 09/14/2010  . VITAMIN D DEFICIENCY 01/16/2010  . PERS HX TOBACCO USE PRESENTING HAZARDS HEALTH 09/17/2009  . VITAMIN B12 DEFICIENCY 09/06/2008  . INSOMNIA 06/21/2008  . LEG PAIN, BILATERAL 10/06/2007  . Diabetes ([place]) 01/24/2007  . Hyperlipidemia 01/24/2007  . Allergic rhinitis 01/24/2007  . BPH (benign prostatic hypertrophy) 01/24/2007  . Essential hypertension 10/05/2006  . GERD 10/05/2006    Past Surgical History:  Procedure Laterality Date  . CATARACT EXTRACTION     both eyes  . CHOLECYSTECTOMY N/A 07/18/2012   Procedure: LAPAROSCOPIC CHOLECYSTECTOMY WITH INTRAOPERATIVE CHOLANGIOGRAM;  Surgeon: Zenovia Jarred, MD;  Location: [facility];  Service: General;  Laterality: N/A;  . COLONOSCOPY    . POSTERIOR CERVICAL FUSION/FORAMINOTOMY N/A 06/27/2015   Procedure: Posterior Cervical Fusion with lateral mass fixation Cervical four to cervical seven;  Surgeon: Eustace Moore, MD;  Location: [facility];  Service: Neurosurgery;  Laterality: N/A;  . TOTAL KNEE ARTHROPLASTY  2001   Left  . [place]  Medications    Prior to Admission medications   Medication Sig Start Date End Date Taking? Authorizing Provider  amitriptyline (ELAVIL) 50 MG tablet Take 1 tablet (50 mg total) by mouth at bedtime. 10/25/15   [name], MD  amLODipine (NORVASC) 5 MG tablet Take 1 tablet by mouth  daily 04/15/15   [name], MD  aspirin 81 MG EC tablet Take 81 mg by mouth daily.      Historical Provider, MD  atorvastatin (LIPITOR) 40 MG tablet  Take 1 tablet by mouth  daily 04/15/15   [name], MD  azithromycin (ZITHROMAX Z-PAK) 250 MG tablet 2 by mouth day 1, then 1 per day 01/13/16   [name], MD  cetirizine (ZYRTEC) 10 MG tablet Take 1 tablet (10 mg total) by mouth daily. 04/09/15   [name], MD  Cholecalciferol (VITAMIN D3) 1000 UNITS CAPS Take 1 capsule (1,000 Units total) by mouth daily. 03/22/13   [name], MD  fesoterodine (TOVIAZ) 4 MG TB24 tablet Take 1 tablet (4 mg total) by mouth daily. 05/02/15   [name], MD  fexofenadine (ALLEGRA) 180 MG tablet Take 1 tablet (180 mg total) by mouth daily. 01/13/16 01/12/17  [name], MD  gabapentin (NEURONTIN) 100 MG capsule Take 1 capsule (100 mg total) by mouth 3 (three) times daily. For 1 week to start 11/21/15   [name], MD  gabapentin (NEURONTIN) 300 MG capsule Take 1 capsule (300 mg total) by mouth 3 (three) times daily. To start after 1 wk at 100 mg dosing 11/21/15   [name], MD  losartan-hydrochlorothiazide [facility]) 100-25 MG tablet Take 1 tablet by mouth  daily 04/15/15   [name], MD  meclizine (ANTIVERT) 12.5 MG tablet Take 1 tablet (12.5 mg total) by mouth 3 (three) times daily as needed for dizziness. 10/08/15 10/07/16  [name], MD  metFORMIN (GLUCOPHAGE) 500 MG tablet Take 2 tablets by mouth  twice a day with meals 04/15/15   [name], MD  methocarbamol (ROBAXIN) 500 MG tablet Take 1 tablet (500 mg total) by mouth every 6 (six) hours as needed for muscle spasms. 06/28/15   Eustace Moore, MD  Multiple Vitamins-Minerals (MULTIVITAMIN PO) Take 1 tablet by mouth daily.    Historical Provider, MD  oxyCODONE-acetaminophen (PERCOCET/ROXICET) 5-325 MG tablet Take 1 tablet by mouth every 4 (four) hours as needed for moderate pain. 06/28/15   Eustace Moore, MD  pantoprazole (PROTONIX) 40 MG tablet Take 1 tablet by mouth  daily 04/15/15   [name], MD  sildenafil (VIAGRA) 100 MG tablet Take 0.5-1 tablets (50-100 mg total) by mouth daily as needed for erectile  dysfunction. 04/09/15   [name], MD  tamsulosin (FLOMAX) 0.4 MG CAPS capsule Take 1 capsule (0.4 mg total) by mouth daily. 10/08/15   [name], MD    Family History Family History  Problem Relation Age of Onset  . Diabetes Mellitus II Mother   . Lung cancer Brother   . Colon cancer Neg Hx   . Esophageal cancer Neg Hx   . Rectal cancer Neg Hx   . Stomach cancer Neg Hx     Social History Social History  Substance Use Topics  . Smoking status: Former Smoker    Packs/day: 0.00    Types: Cigarettes  . Smokeless tobacco: Never Used     Comment: quit smoking around 1990  . Alcohol use No  Allergies   Patient has no known allergies.   Review of Systems Review of Systems  All other systems reviewed and are negative.    Physical Exam Updated Vital Signs BP 164/83 (BP Location: Right Arm)   Pulse 110   Temp 97.6 F (36.4 C) (Oral)   Resp 18   SpO2 95%   Physical Exam  Constitutional: He is oriented to person, place, and time. He appears well-developed and well-nourished.  Non-toxic appearance. No distress.  HENT:  Head: Normocephalic and atraumatic.  Nose: Epistaxis is observed.  Mild epistaxis noted from left anterior septum  Eyes: Conjunctivae, EOM and lids are normal. Pupils are equal, round, and reactive to light.  Neck: Normal range of motion. Neck supple. No tracheal deviation present. No thyroid mass present.  Cardiovascular: Normal rate, regular rhythm and normal heart sounds.  Exam reveals no gallop.   No murmur heard. Pulmonary/Chest: Effort normal and breath sounds normal. No stridor. No respiratory distress. He has no decreased breath sounds. He has no wheezes. He has no rhonchi. He has no rales.  Abdominal: Soft. Normal appearance and bowel sounds are normal. He exhibits no distension. There is no tenderness. There is no rebound and no CVA tenderness.  Musculoskeletal: Normal range of motion. He exhibits no edema or tenderness.  Neurological: He  is alert and oriented to person, place, and time. He has normal strength. No cranial nerve deficit or sensory deficit. GCS eye subscore is 4. GCS verbal subscore is 5. GCS motor subscore is 6.  Skin: Skin is warm and dry. No abrasion and no rash noted.  Psychiatric: He has a normal mood and affect. His speech is normal and behavior is normal.  Nursing note and vitals reviewed.    ED Treatments / Results  Labs (all labs ordered are listed, but only abnormal results are displayed) Labs Reviewed - No data to display  EKG  EKG Interpretation None       Radiology No results found.  Procedures .Epistaxis Management Date/Time: 02/16/2016 11:29 AM Performed by: Lacretia Patton Authorized by: Lacretia Patton   Consent:    Consent obtained:  Verbal   Consent given by:  Patient   Risks discussed:  Infection Anesthesia (see MAR for exact dosages):    Anesthesia method:  None Procedure details:    Treatment site:  L anterior and L septum   Treatment method:  Nasal balloon   Treatment complexity:  Limited   Treatment episode: initial   Post-procedure details:    Assessment:  Bleeding stopped   Patient tolerance of procedure:  Tolerated well, no immediate complications   (including critical care time)  Medications Ordered in ED Medications - No data to display   Initial Impression / Assessment and Plan / ED Course  I have reviewed the triage vital signs and the nursing notes.  Pertinent labs & imaging results that were available during my care of the patient were reviewed by me and considered in my medical decision making (see chart for details).  Clinical Course     Patient had a rapid Rhino placed into the left nares. No further bleeding noted. Patient will follow-up with his ENT doctor tomorrow  Final Clinical Impressions(s) / ED Diagnoses   Final diagnoses:  None    New Prescriptions New Prescriptions   No medications on file     Lacretia Leigh, MD 02/16/16  1130

## 2016-02-16 NOTE — ED Notes (Signed)
Dr. Zenia Resides at bedside to place RhinoRocket in right nare.

## 2016-02-16 NOTE — Discharge Instructions (Signed)
Follow-up with Dr. Lucia Gaskins tomorrow

## 2016-02-16 NOTE — ED Notes (Signed)
Pt comfortable with discharge and follow up instructions. Pt declines wheelchair, escorted to waiting area by this RN. No Rx

## 2016-02-16 NOTE — ED Triage Notes (Signed)
Pt reports nosebleed yesterday and today from both nares. Has been here several times over past two months due to same. Denies being on blood thinners.

## 2016-02-17 DIAGNOSIS — R040 Epistaxis: Secondary | ICD-10-CM | POA: Diagnosis not present

## 2016-02-19 ENCOUNTER — Encounter: Payer: Self-pay | Admitting: Internal Medicine

## 2016-02-19 DIAGNOSIS — I10 Essential (primary) hypertension: Secondary | ICD-10-CM | POA: Diagnosis not present

## 2016-02-19 DIAGNOSIS — M47812 Spondylosis without myelopathy or radiculopathy, cervical region: Secondary | ICD-10-CM | POA: Diagnosis not present

## 2016-02-26 ENCOUNTER — Encounter: Payer: Self-pay | Admitting: Internal Medicine

## 2016-02-26 ENCOUNTER — Ambulatory Visit (INDEPENDENT_AMBULATORY_CARE_PROVIDER_SITE_OTHER): Payer: Medicare Other | Admitting: Internal Medicine

## 2016-02-26 VITALS — BP 130/60 | HR 70 | Ht 74.0 in | Wt 174.0 lb

## 2016-02-26 DIAGNOSIS — R195 Other fecal abnormalities: Secondary | ICD-10-CM | POA: Diagnosis not present

## 2016-02-26 DIAGNOSIS — K625 Hemorrhage of anus and rectum: Secondary | ICD-10-CM

## 2016-02-26 DIAGNOSIS — K648 Other hemorrhoids: Secondary | ICD-10-CM | POA: Diagnosis not present

## 2016-02-26 DIAGNOSIS — Z86010 Personal history of colonic polyps: Secondary | ICD-10-CM | POA: Diagnosis not present

## 2016-02-26 MED ORDER — DIPHENOXYLATE-ATROPINE 2.5-0.025 MG PO TABS: 1.0000 | ORAL_TABLET | Freq: Four times a day (QID) | ORAL | 2 refills | Status: DC | PRN

## 2016-02-26 NOTE — Progress Notes (Signed)
Patient ID: Nicholas Patton, male   DOB: August 21, 1934, 81 y.o.   MRN: [id number] HPI: Nicholas Patton is a 81 year old male with a history of adenomatous colon polyps, internal hemorrhoids, GERD, colonic diverticulosis who is seen in consultation at the request of Dr. Jenny Patton to evaluate rectal bleeding. He is here alone today. He also has a history of diabetes, hyperlipidemia, hypertension and peripheral neuropathy.  He states that in October he had 2-3 days of painless rectal bleeding with bowel movement. This occurred with otherwise normal-appearing brown stool. It has not recurred since late October. He reports his stools as occasionally loose which is been persistent since cholecystectomy. He denies melena. He denies abdominal pain. Appetite fluctuates but he denies nausea, vomiting, early satiety and trouble swallowing. No fever or chills. No chest pain. He takes pantoprazole 40 mg daily which controls his reflux "very well".  His last colonoscopy was 12/13/2014. This showed 4 sessile polyps ranging in size from 3-7 mm. There was moderate diverticulosis in the left colon. Retroflex view revealed internal hemorrhoids. Polyps were found to be a tubulovillous adenoma, tubular adenoma, an inflammatory polyp. There was no high-grade dysplasia.  Past Medical History:  Diagnosis Date  . Arthritis   . DIABETES MELLITUS, TYPE II    takes Metformin and Glimepiride daily  . Diverticulosis   . GERD (gastroesophageal reflux disease)    takes Protonix daily  . History of colon polyps   . HYPERLIPIDEMIA    takes Atorvastatin daily  . HYPERTENSION    takes Amlodipine and Hyzaar daily  . Ileus following gastrointestinal surgery 07/22/2012  . Joint pain   . Peripheral neuropathy ([place])   . Peripheral neuropathy ([place])   . PERIPHERAL NEUROPATHY, FEET 10/06/2007  . VITAMIN B12 DEFICIENCY 09/06/2008  . VITAMIN D DEFICIENCY 01/16/2010   takes Vitamin D daily    Past Surgical History:  Procedure Laterality Date  .  CATARACT EXTRACTION     both eyes  . CHOLECYSTECTOMY N/A 07/18/2012   Procedure: LAPAROSCOPIC CHOLECYSTECTOMY WITH INTRAOPERATIVE CHOLANGIOGRAM;  Surgeon: Nicholas Jarred, MD;  Location: [facility];  Service: General;  Laterality: N/A;  . COLONOSCOPY    . POSTERIOR CERVICAL FUSION/FORAMINOTOMY N/A 06/27/2015   Procedure: Posterior Cervical Fusion with lateral mass fixation Cervical four to cervical seven;  Surgeon: Nicholas Moore, MD;  Location: [facility];  Service: Neurosurgery;  Laterality: N/A;  . TOTAL KNEE ARTHROPLASTY  2001   Left  . TURP VAPORIZATION      Outpatient Medications Prior to Visit  Medication Sig Dispense Refill  . amLODipine (NORVASC) 5 MG tablet Take 1 tablet by mouth  daily 90 tablet 3  . aspirin 81 MG EC tablet Take 81 mg by mouth daily.      Marland Kitchen atorvastatin (LIPITOR) 40 MG tablet Take 1 tablet by mouth  daily 90 tablet 3  . cetirizine (ZYRTEC) 10 MG tablet Take 1 tablet (10 mg total) by mouth daily. 30 tablet 11  . Cholecalciferol (VITAMIN D3) 1000 UNITS CAPS Take 1 capsule (1,000 Units total) by mouth daily. 90 capsule 3  . fexofenadine (ALLEGRA) 180 MG tablet Take 1 tablet (180 mg total) by mouth daily. 30 tablet 2  . glimepiride (AMARYL) 1 MG tablet Take 0.5 mg by mouth 2 (two) times daily.    Marland Kitchen losartan-hydrochlorothiazide (HYZAAR) 100-25 MG tablet Take 1 tablet by mouth  daily 90 tablet 3  . meclizine (ANTIVERT) 12.5 MG tablet Take 1 tablet (12.5 mg total) by mouth 3 (three) times daily as  needed for dizziness. 30 tablet 2  . metFORMIN (GLUCOPHAGE) 500 MG tablet Take 2 tablets by mouth  twice a day with meals 360 tablet 3  . methocarbamol (ROBAXIN) 500 MG tablet Take 1 tablet (500 mg total) by mouth every 6 (six) hours as needed for muscle spasms. 60 tablet 1  . Multiple Vitamins-Minerals (MULTIVITAMIN PO) Take 1 tablet by mouth daily.    . pantoprazole (PROTONIX) 40 MG tablet Take 1 tablet by mouth  daily 90 tablet 3  . sildenafil (VIAGRA) 100 MG tablet Take 0.5-1  tablets (50-100 mg total) by mouth daily as needed for erectile dysfunction. 2 tablet 11  . tamsulosin (FLOMAX) 0.4 MG CAPS capsule Take 1 capsule (0.4 mg total) by mouth daily. 90 capsule 3  . topiramate (TOPAMAX) 25 MG capsule Take 25 mg by mouth 2 (two) times daily.    Marland Kitchen amitriptyline (ELAVIL) 50 MG tablet Take 1 tablet (50 mg total) by mouth at bedtime. (Patient not taking: Reported on 02/26/2016) 90 tablet 1  . azithromycin (ZITHROMAX Z-PAK) 250 MG tablet 2 by mouth day 1, then 1 per day (Patient not taking: Reported on 02/26/2016) 6 tablet 1  . fesoterodine (TOVIAZ) 4 MG TB24 tablet Take 1 tablet (4 mg total) by mouth daily. (Patient not taking: Reported on 02/26/2016) 90 tablet 3  . gabapentin (NEURONTIN) 100 MG capsule Take 1 capsule (100 mg total) by mouth 3 (three) times daily. For 1 week to start (Patient not taking: Reported on 02/26/2016) 21 capsule 0  . gabapentin (NEURONTIN) 300 MG capsule Take 1 capsule (300 mg total) by mouth 3 (three) times daily. To start after 1 wk at 100 mg dosing (Patient not taking: Reported on 02/26/2016) 90 capsule 3  . oxyCODONE-acetaminophen (PERCOCET/ROXICET) 5-325 MG tablet Take 1 tablet by mouth every 4 (four) hours as needed for moderate pain. (Patient not taking: Reported on 02/26/2016) 60 tablet 0   No facility-administered medications prior to visit.     No Known Allergies  Family History  Problem Relation Age of Onset  . Diabetes Mellitus II Mother   . Lung cancer Brother   . Colon cancer Neg Hx   . Esophageal cancer Neg Hx   . Rectal cancer Neg Hx   . Stomach cancer Neg Hx     Social History  Substance Use Topics  . Smoking status: Former Smoker    Packs/day: 0.00    Types: Cigarettes  . Smokeless tobacco: Never Used     Comment: quit smoking around 1990  . Alcohol use No    ROS: As per history of present illness, otherwise negative  BP 130/60   Pulse 70   Ht _0  (1.88 m)   Wt 174 lb (78.9 kg)   SpO2 96%   BMI 22.34  kg/m  Constitutional: Well-developed and well-nourished. No distress. HEENT: Normocephalic and atraumatic.  Conjunctivae are normal.  No scleral icterus. Neck: Neck supple. Trachea midline. Cardiovascular: Normal rate, regular rhythm and intact distal pulses.  Pulmonary/chest: Effort normal and breath sounds normal. Abdominal: Soft, nontender, nondistended. Bowel sounds active throughout. There are no masses palpable. No hepatosplenomegaly. Extremities: no clubbing, cyanosis, or edema Neurological: Alert and oriented to person place and time. Skin: Skin is warm and dry. No rashes noted. Psychiatric: Normal mood and affect. Behavior is normal.  RELEVANT LABS AND IMAGING: CBC    Component Value Date/Time   WBC 4.6 01/08/2016 1002   RBC 4.57 01/08/2016 1002   HGB 12.5 (L) 01/08/2016 1002  HCT 39.8 01/08/2016 1002   PLT 223 01/08/2016 1002   MCV 87.1 01/08/2016 1002   MCH 27.4 01/08/2016 1002   MCHC 31.4 01/08/2016 1002   RDW 14.5 01/08/2016 1002   LYMPHSABS 0.9 01/08/2016 1002   MONOABS 0.3 01/08/2016 1002   EOSABS 0.1 01/08/2016 1002   BASOSABS 0.0 01/08/2016 1002    CMP     Component Value Date/Time   NA 139 10/25/2015 1446   K 4.6 10/25/2015 1446   CL 101 10/25/2015 1446   CO2 30 10/25/2015 1446   GLUCOSE 99 10/25/2015 1446   BUN 22 10/25/2015 1446   CREATININE 1.68 (H) 10/25/2015 1446   CALCIUM 10.5 10/25/2015 1446   PROT 7.2 10/08/2015 1029   ALBUMIN 4.6 10/08/2015 1029   AST 20 10/08/2015 1029   ALT 11 10/08/2015 1029   ALKPHOS 95 10/08/2015 1029   BILITOT 0.5 10/08/2015 1029   GFRNONAA 43 (L) 06/19/2015 1533   GFRAA 50 (L) 06/19/2015 1533    ASSESSMENT/PLAN: 81 year old male with a history of adenomatous colon polyps, internal hemorrhoids, GERD, colonic diverticulosis who is seen in consultation at the request of Dr. Jenny Patton to evaluate rectal bleeding.  1. Painless rectal bleeding/internal hemorrhoids -- pelvis rectal bleeding has resolved for over 2 months  now. Likely this was internal hemorrhoidal bleeding. He does report intermittent issues with prolapse symptoms but are not currently troublesome to him. Diverticular bleeding felt less likely. We discussed hemorrhoidal banding, but given the lack of symptoms presently we will defer this for now. I asked that he notify me should he develop recurrent rectal bleeding.  2. History of colon polyps -- up-to-date with screening/surveillance colonoscopy with last exam 14 months ago. He had a less than 1 cm tubulovillous adenoma on the last colonoscopy and I recommended repeat exam in October 2019 depending on his overall health at that time.  3. Intermittent loose stools -- likely related to cholecystectomy. Prescription for Lomotil to be used up to 4 times a day as needed. Call if this is ineffective.  Follow-up in 3-4 months otherwise as needed    FR:TMYTR [address], [address] [place], [place] [address]

## 2016-02-26 NOTE — Patient Instructions (Addendum)
We have sent the following medications to your pharmacy for you to pick up at your convenience: Lomotil four times daily as needed for loose stool  Call our office should you have any recurrent rectal bleeding.  Follow up with Dr Hilarie Fredrickson in 4 months.  If you are age 79 or older, your body mass index should be between 23-30. Your Body mass index is 22.34 kg/m. If this is out of the aforementioned range listed, please consider follow up with your Primary Care Provider.  If you are age 47 or younger, your body mass index should be between 19-25. Your Body mass index is 22.34 kg/m. If this is out of the aformentioned range listed, please consider follow up with your Primary Care Provider.

## 2016-03-16 ENCOUNTER — Telehealth: Payer: Self-pay | Admitting: Internal Medicine

## 2016-03-16 NOTE — Telephone Encounter (Signed)
Attempted to call patient to schedule awv, but phone was busy. Will attempt to call patient again.

## 2016-03-24 DIAGNOSIS — M47812 Spondylosis without myelopathy or radiculopathy, cervical region: Secondary | ICD-10-CM | POA: Diagnosis not present

## 2016-04-02 DIAGNOSIS — M47812 Spondylosis without myelopathy or radiculopathy, cervical region: Secondary | ICD-10-CM | POA: Diagnosis not present

## 2016-04-02 DIAGNOSIS — I10 Essential (primary) hypertension: Secondary | ICD-10-CM | POA: Diagnosis not present

## 2016-04-09 ENCOUNTER — Encounter: Payer: Self-pay | Admitting: Internal Medicine

## 2016-04-09 ENCOUNTER — Ambulatory Visit: Payer: Medicare Other | Admitting: Internal Medicine

## 2016-04-09 ENCOUNTER — Other Ambulatory Visit (INDEPENDENT_AMBULATORY_CARE_PROVIDER_SITE_OTHER): Payer: Medicare Other

## 2016-04-09 ENCOUNTER — Ambulatory Visit (INDEPENDENT_AMBULATORY_CARE_PROVIDER_SITE_OTHER): Payer: Medicare Other | Admitting: Internal Medicine

## 2016-04-09 VITALS — BP 134/78 | HR 87 | Temp 98.4°F | Resp 20 | Wt 179.0 lb

## 2016-04-09 DIAGNOSIS — E785 Hyperlipidemia, unspecified: Secondary | ICD-10-CM | POA: Diagnosis not present

## 2016-04-09 DIAGNOSIS — I10 Essential (primary) hypertension: Secondary | ICD-10-CM

## 2016-04-09 DIAGNOSIS — E1140 Type 2 diabetes mellitus with diabetic neuropathy, unspecified: Secondary | ICD-10-CM

## 2016-04-09 DIAGNOSIS — N320 Bladder-neck obstruction: Secondary | ICD-10-CM

## 2016-04-09 LAB — URINALYSIS, ROUTINE W REFLEX MICROSCOPIC
Bilirubin Urine: NEGATIVE
Hgb urine dipstick: NEGATIVE
Ketones, ur: NEGATIVE
Leukocytes, UA: NEGATIVE
Nitrite: NEGATIVE
RBC / HPF: NONE SEEN (ref 0–?)
Specific Gravity, Urine: <=1.005 — AB (ref 1.000–1.030)
Total Protein, Urine: NEGATIVE
Urine Glucose: NEGATIVE
Urobilinogen, UA: 0.2 (ref 0.0–1.0)
pH: 6.5 (ref 5.0–8.0)

## 2016-04-09 LAB — CBC WITH DIFFERENTIAL/PLATELET
Basophils Absolute: 0 10*3/uL (ref 0.0–0.1)
Basophils Relative: 0.3 % (ref 0.0–3.0)
Eosinophils Absolute: 0.1 10*3/uL (ref 0.0–0.7)
Eosinophils Relative: 2.6 % (ref 0.0–5.0)
HCT: 35.9 % — ABNORMAL LOW (ref 39.0–52.0)
Hemoglobin: 11.6 g/dL — ABNORMAL LOW (ref 13.0–17.0)
Lymphocytes Relative: 32 % (ref 12.0–46.0)
Lymphs Abs: 1.4 10*3/uL (ref 0.7–4.0)
MCHC: 32.2 g/dL (ref 30.0–36.0)
MCV: 83.1 fl (ref 78.0–100.0)
Monocytes Absolute: 0.4 10*3/uL (ref 0.1–1.0)
Monocytes Relative: 9.9 % (ref 3.0–12.0)
Neutro Abs: 2.5 10*3/uL (ref 1.4–7.7)
Neutrophils Relative %: 55.2 % (ref 43.0–77.0)
Platelets: 234 10*3/uL (ref 150.0–400.0)
RBC: 4.32 Mil/uL (ref 4.22–5.81)
RDW: 15.3 % (ref 11.5–15.5)
WBC: 4.5 10*3/uL (ref 4.0–10.5)

## 2016-04-09 LAB — HEPATIC FUNCTION PANEL
ALT: 11 U/L (ref 0–53)
AST: 17 U/L (ref 0–37)
Albumin: 4.6 g/dL (ref 3.5–5.2)
Alkaline Phosphatase: 99 U/L (ref 39–117)
Bilirubin, Direct: 0.1 mg/dL (ref 0.0–0.3)
Total Bilirubin: 0.4 mg/dL (ref 0.2–1.2)
Total Protein: 7.1 g/dL (ref 6.0–8.3)

## 2016-04-09 LAB — LIPID PANEL
Cholesterol: 140 mg/dL (ref 0–200)
HDL: 66.7 mg/dL (ref >39.00)
LDL Cholesterol: 60 mg/dL (ref 0–99)
NonHDL: 73.65
Total CHOL/HDL Ratio: 2
Triglycerides: 69 mg/dL (ref 0.0–149.0)
VLDL: 13.8 mg/dL (ref 0.0–40.0)

## 2016-04-09 LAB — BASIC METABOLIC PANEL
BUN: 21 mg/dL (ref 6–23)
CO2: 32 mEq/L (ref 19–32)
Calcium: 10.2 mg/dL (ref 8.4–10.5)
Chloride: 102 mEq/L (ref 96–112)
Creatinine, Ser: 1.48 mg/dL (ref 0.40–1.50)
GFR: 58.86 mL/min — ABNORMAL LOW (ref >60.00)
Glucose, Bld: 96 mg/dL (ref 70–99)
Potassium: 4.7 mEq/L (ref 3.5–5.1)
Sodium: 140 mEq/L (ref 135–145)

## 2016-04-09 LAB — PSA: PSA: 3.82 ng/mL (ref 0.10–4.00)

## 2016-04-09 LAB — MICROALBUMIN / CREATININE URINE RATIO
Creatinine,U: 39.6 mg/dL
Microalb Creat Ratio: 0.8 mg/g (ref 0.0–30.0)
Microalb, Ur: 0.3 mg/dL (ref 0.0–1.9)

## 2016-04-09 LAB — HEMOGLOBIN A1C: Hgb A1c MFr Bld: 6.6 % — ABNORMAL HIGH (ref 4.6–6.5)

## 2016-04-09 LAB — TSH: TSH: 3.32 u[IU]/mL (ref 0.35–4.50)

## 2016-04-09 MED ORDER — GLIMEPIRIDE 1 MG PO TABS: 0.5000 mg | ORAL_TABLET | Freq: Two times a day (BID) | ORAL | 3 refills | Status: DC

## 2016-04-09 MED ORDER — LOSARTAN POTASSIUM-HCTZ 100-25 MG PO TABS: 1.0000 | ORAL_TABLET | Freq: Every day | ORAL | 3 refills | Status: DC

## 2016-04-09 MED ORDER — TAMSULOSIN HCL 0.4 MG PO CAPS: 0.4000 mg | ORAL_CAPSULE | Freq: Every day | ORAL | 3 refills | Status: DC

## 2016-04-09 MED ORDER — ATORVASTATIN CALCIUM 40 MG PO TABS: 40.0000 mg | ORAL_TABLET | Freq: Every day | ORAL | 3 refills | Status: DC

## 2016-04-09 MED ORDER — PANTOPRAZOLE SODIUM 40 MG PO TBEC: 40.0000 mg | DELAYED_RELEASE_TABLET | Freq: Every day | ORAL | 3 refills | Status: DC

## 2016-04-09 MED ORDER — METFORMIN HCL 500 MG PO TABS: ORAL_TABLET | ORAL | 3 refills | Status: DC

## 2016-04-09 MED ORDER — AMLODIPINE BESYLATE 5 MG PO TABS: 5.0000 mg | ORAL_TABLET | Freq: Every day | ORAL | 3 refills | Status: DC

## 2016-04-09 NOTE — Progress Notes (Signed)
Subjective:    Patient ID: Nicholas Patton, male    DOB: June 23, 1934, 82 y.o.   MRN: [id number]  HPI  Here to f/u; overall doing ok,  Pt denies chest pain, increasing sob or doe, wheezing, orthopnea, PND, increased LE swelling, palpitations, dizziness or syncope.  Pt denies new neurological symptoms such as new headache, or facial or extremity weakness or numbness.  Pt denies polydipsia, polyuria, or low sugar episode.   Pt denies new neurological symptoms such as new headache, or facial or extremity weakness or numbness.   Pt states overall good compliance with meds, mostly trying to follow appropriate diet, with wt overall stable,  but little exercise however. Due for optho f/u exam mar 2018.  No other new hx .lastbpe Wt Readings from Last 3 Encounters:  04/09/16 179 lb (81.2 kg)  02/26/16 174 lb (78.9 kg)  01/13/16 179 lb (81.2 kg)   BP Readings from Last 3 Encounters:  04/09/16 134/78  02/26/16 130/60  02/16/16 129/67   Past Medical History:  Diagnosis Date  . Arthritis   . DIABETES MELLITUS, TYPE II    takes Metformin and Glimepiride daily  . Diverticulosis   . GERD (gastroesophageal reflux disease)    takes Protonix daily  . History of colon polyps   . HYPERLIPIDEMIA    takes Atorvastatin daily  . HYPERTENSION    takes Amlodipine and Hyzaar daily  . Ileus following gastrointestinal surgery 07/22/2012  . Joint pain   . Peripheral neuropathy ([place])   . Peripheral neuropathy ([place])   . PERIPHERAL NEUROPATHY, FEET 10/06/2007  . VITAMIN B12 DEFICIENCY 09/06/2008  . VITAMIN D DEFICIENCY 01/16/2010   takes Vitamin D daily   Past Surgical History:  Procedure Laterality Date  . CATARACT EXTRACTION     both eyes  . CHOLECYSTECTOMY N/A 07/18/2012   Procedure: LAPAROSCOPIC CHOLECYSTECTOMY WITH INTRAOPERATIVE CHOLANGIOGRAM;  Surgeon: Zenovia Jarred, MD;  Location: [facility];  Service: General;  Laterality: N/A;  . COLONOSCOPY    . POSTERIOR CERVICAL FUSION/FORAMINOTOMY N/A 06/27/2015   Procedure: Posterior Cervical Fusion with lateral mass fixation Cervical four to cervical seven;  Surgeon: Eustace Moore, MD;  Location: [facility];  Service: Neurosurgery;  Laterality: N/A;  . TOTAL KNEE ARTHROPLASTY  2001   Left  . TURP VAPORIZATION      reports that he has quit smoking. His smoking use included Cigarettes. He smoked 0.00 packs per day. He has never used smokeless tobacco. He reports that he does not drink alcohol or use drugs. family history includes Diabetes Mellitus II in his mother; Lung cancer in his brother. No Known Allergies Current Outpatient Prescriptions on File Prior to Visit  Medication Sig Dispense Refill  . aspirin 81 MG EC tablet Take 81 mg by mouth daily.      . cetirizine (ZYRTEC) 10 MG tablet Take 1 tablet (10 mg total) by mouth daily. 30 tablet 11  . Cholecalciferol (VITAMIN D3) 1000 UNITS CAPS Take 1 capsule (1,000 Units total) by mouth daily. 90 capsule 3  . diphenoxylate-atropine (LOMOTIL) 2.5-0.025 MG tablet Take 1 tablet by mouth 4 (four) times daily as needed for diarrhea or loose stools. 120 tablet 2  . fexofenadine (ALLEGRA) 180 MG tablet Take 1 tablet (180 mg total) by mouth daily. 30 tablet 2  . meclizine (ANTIVERT) 12.5 MG tablet Take 1 tablet (12.5 mg total) by mouth 3 (three) times daily as needed for dizziness. 30 tablet 2  . methocarbamol (ROBAXIN) 500 MG tablet Take 1  tablet (500 mg total) by mouth every 6 (six) hours as needed for muscle spasms. 60 tablet 1  . Multiple Vitamins-Minerals (MULTIVITAMIN PO) Take 1 tablet by mouth daily.    . sildenafil (VIAGRA) 100 MG tablet Take 0.5-1 tablets (50-100 mg total) by mouth daily as needed for erectile dysfunction. 2 tablet 11  . topiramate (TOPAMAX) 25 MG capsule Take 25 mg by mouth 2 (two) times daily.     No current facility-administered medications on file prior to visit.    Review of Systems  Constitutional: Negative for unusual diaphoresis or night sweats HENT: Negative for ear  swelling or discharge Eyes: Negative for worsening visual haziness  Respiratory: Negative for choking and stridor.   Gastrointestinal: Negative for distension or worsening eructation Genitourinary: Negative for retention or change in urine volume.  Musculoskeletal: Negative for other MSK pain or swelling Skin: Negative for color change and worsening wound Neurological: Negative for tremors and numbness other than noted  Psychiatric/Behavioral: Negative for decreased concentration or agitation other than above   All other system neg per pt    Objective:   Physical Exam BP 134/78   Pulse 87   Temp 98.4 F (36.9 C) (Oral)   Resp 20   Wt 179 lb (81.2 kg)   SpO2 92%   BMI 22.98 kg/m  VS noted,  Constitutional: Pt appears in no apparent distress HENT: Head: NCAT.  Right Ear: External ear normal.  Left Ear: External ear normal.  Eyes: . Pupils are equal, round, and reactive to light. Conjunctivae and EOM are normal Neck: Normal range of motion. Neck supple.  Cardiovascular: Normal rate and regular rhythm.   Pulmonary/Chest: Effort normal and breath sounds without rales or wheezing.  Neurological: Pt is alert. Not confused , motor grossly intact Skin: Skin is warm. No rash, no LE edema Psychiatric: Pt behavior is normal. No agitation.  No other new exam findings      Assessment & Plan:

## 2016-04-09 NOTE — Patient Instructions (Signed)
Please continue all other medications as before, and refills have been done if requested.  Please have the pharmacy call with any other refills you may need.  Please continue your efforts at being more active, low cholesterol diet, and weight control.  You are otherwise up to date with prevention measures today.  Please keep your appointments with your specialists as you may have planned  Please go to the LAB in the Basement (turn left off the elevator) for the tests to be done today  You will be contacted by phone if any changes need to be made immediately.  Otherwise, you will receive a letter about your results with an explanation, but please check with MyChart first.  Please remember to sign up for MyChart if you have not done so, as this will be important to you in the future with finding out test results, communicating by private email, and scheduling acute appointments online when needed.  Please return in 6 months, or sooner if needed

## 2016-04-09 NOTE — Progress Notes (Signed)
Pre visit review using our clinic review tool, if applicable. No additional management support is needed unless otherwise documented below in the visit note.

## 2016-04-10 ENCOUNTER — Encounter: Payer: Self-pay | Admitting: Internal Medicine

## 2016-04-10 ENCOUNTER — Other Ambulatory Visit: Payer: Self-pay | Admitting: Internal Medicine

## 2016-04-10 ENCOUNTER — Other Ambulatory Visit (INDEPENDENT_AMBULATORY_CARE_PROVIDER_SITE_OTHER): Payer: Medicare Other

## 2016-04-10 DIAGNOSIS — D649 Anemia, unspecified: Secondary | ICD-10-CM

## 2016-04-10 DIAGNOSIS — D509 Iron deficiency anemia, unspecified: Secondary | ICD-10-CM | POA: Insufficient documentation

## 2016-04-10 LAB — IBC PANEL
Iron: 30 ug/dL — ABNORMAL LOW (ref 42–165)
Saturation Ratios: 6.5 % — ABNORMAL LOW (ref 20.0–50.0)
Transferrin: 331 mg/dL (ref 212.0–360.0)

## 2016-04-10 NOTE — Assessment & Plan Note (Signed)
Also for psa as he is due

## 2016-04-10 NOTE — Assessment & Plan Note (Signed)
stable overall by history and exam, recent data reviewed with pt, and pt to continue medical treatment as before,  to f/u any worsening symptoms or concerns Lab Results  Component Value Date   LDLCALC 60 04/09/2016

## 2016-04-10 NOTE — Assessment & Plan Note (Signed)
stable overall by history and exam, recent data reviewed with pt, and pt to continue medical treatment as before,  to f/u any worsening symptoms or concerns  Lab Results  Component Value Date   HGBA1C 6.6 (H) 04/09/2016

## 2016-04-10 NOTE — Assessment & Plan Note (Signed)
stable overall by history and exam, recent data reviewed with pt, and pt to continue medical treatment as before,  to f/u any worsening symptoms or concerns

## 2016-04-14 ENCOUNTER — Telehealth: Payer: Self-pay | Admitting: Emergency Medicine

## 2016-04-14 ENCOUNTER — Ambulatory Visit (INDEPENDENT_AMBULATORY_CARE_PROVIDER_SITE_OTHER): Payer: Medicare Other | Admitting: Internal Medicine

## 2016-04-14 ENCOUNTER — Encounter: Payer: Self-pay | Admitting: *Deleted

## 2016-04-14 VITALS — BP 162/76 | HR 72 | Ht 72.5 in | Wt 175.4 lb

## 2016-04-14 DIAGNOSIS — Z8719 Personal history of other diseases of the digestive system: Secondary | ICD-10-CM | POA: Diagnosis not present

## 2016-04-14 DIAGNOSIS — D509 Iron deficiency anemia, unspecified: Secondary | ICD-10-CM

## 2016-04-14 DIAGNOSIS — Z86010 Personal history of colonic polyps: Secondary | ICD-10-CM

## 2016-04-14 MED ORDER — NA SULFATE-K SULFATE-MG SULF 17.5-3.13-1.6 GM/177ML PO SOLN: ORAL | 0 refills | Status: DC

## 2016-04-14 NOTE — Progress Notes (Signed)
Subjective:    Patient ID: Nicholas Patton, male    DOB: [birth year], 82 y.o.   MRN: [id number]  HPI Nicholas Patton is a 82 yo male with PMH of adenomatous colon polyps, internal hemorrhoids, colonic diverticulosis and GERD who is seen in follow-up at the request of Dr. [name] for new iron deficiency anemia. He is here alone today. I saw him in December to evaluate rectal bleeding which was felt at that time to have been related to internal hemorrhoids. It had resolved.  He reports seeing no further blood in the stool or melena. Stools are at times loose and Lomotil was helping with this. He has subsequently started oral iron and stools have darkened and been slightly more formed. No constipation. He denies abdominal pain. Appetite is good. No trouble swallowing. No nausea or vomiting. No early satiety. He denies chest pain and shortness of breath.  He has continued pantoprazole which she reports works very well for his reflux.   Review of Systems As per history of present illness, otherwise negative  Current Medications, Allergies, Past Medical History, Past Surgical History, Family History and Social History were reviewed in [employer] record.     Objective:   Physical Exam BP (!) 162/76 (BP Location: Left Arm, Patient Position: Sitting, Cuff Size: Normal)   Pulse 72   Ht 6' 0.5" (1.842 m) Comment: height measured without shoes  Wt 175 lb 6 oz (79.5 kg)   BMI 23.46 kg/m  Constitutional: Well-developed and well-nourished. No distress. HEENT: Normocephalic and atraumatic.  Conjunctivae are normal.  No scleral icterus. Neck: Neck supple. Trachea midline. Cardiovascular: Normal rate, regular rhythm and intact distal pulses. No M/R/G Pulmonary/chest: Effort normal and breath sounds normal. No wheezing, rales or rhonchi. Abdominal: Soft, nontender, nondistended. Bowel sounds active throughout.  Extremities: no clubbing, cyanosis, or edema Neurological: Alert and  oriented to person place and time. Skin: Skin is warm and dry.  Psychiatric: Normal mood and affect. Behavior is normal.  CBC    Component Value Date/Time   WBC 4.5 04/09/2016 1625   RBC 4.32 04/09/2016 1625   HGB 11.6 Repeated and verified X2. (L) 04/09/2016 1625   HCT 35.9 (L) 04/09/2016 1625   PLT 234.0 04/09/2016 1625   MCV 83.1 04/09/2016 1625   MCH 27.4 01/08/2016 1002   MCHC 32.2 04/09/2016 1625   RDW 15.3 04/09/2016 1625   LYMPHSABS 1.4 04/09/2016 1625   MONOABS 0.4 04/09/2016 1625   EOSABS 0.1 04/09/2016 1625   BASOSABS 0.0 04/09/2016 1625   CMP     Component Value Date/Time   NA 140 04/09/2016 1625   K 4.7 04/09/2016 1625   CL 102 04/09/2016 1625   CO2 32 04/09/2016 1625   GLUCOSE 96 04/09/2016 1625   BUN 21 04/09/2016 1625   CREATININE 1.48 04/09/2016 1625   CALCIUM 10.2 04/09/2016 1625   PROT 7.1 04/09/2016 1625   ALBUMIN 4.6 04/09/2016 1625   AST 17 04/09/2016 1625   ALT 11 04/09/2016 1625   ALKPHOS 99 04/09/2016 1625   BILITOT 0.4 04/09/2016 1625   GFRNONAA 43 (L) 06/19/2015 1533   GFRAA 50 (L) 06/19/2015 1533   Iron/TIBC/Ferritin/ %Sat    Component Value Date/Time   IRON 30 (L) 04/10/2016 1556   IRONPCTSAT 6.5 (L) 04/10/2016 1556       Assessment & Plan:  82 yo male with PMH of adenomatous colon polyps, internal hemorrhoids, colonic diverticulosis and GERD who is seen in follow-up at the request of  Dr. [name] for new iron deficiency anemia.   1. New IDA/hx of GERD/hx of colon polyps -- In light of iron deficiency anemia I have recommended we proceed to upper endoscopy and colonoscopy. We discussed the risks, benefits and alternatives and he wishes to proceed. Rule out upper GI bleeding source, rule out recurrent polyps/neoplasm. Continue oral iron replacement and repeat CBC and iron studies in 3 months.  25 minutes spent with the patient today. Greater than 50% was spent in counseling and coordination of care with the patient

## 2016-04-14 NOTE — Telephone Encounter (Signed)
Pt called stating he was returning your call. Please give him a call back thanks.

## 2016-04-14 NOTE — Patient Instructions (Signed)
You have been scheduled for an endoscopy and colonoscopy. Please follow the written instructions given to you at your visit today. Please pick up your prep supplies at the pharmacy within the next 1-3 days. If you use inhalers (even only as needed), please bring them with you on the day of your procedure. Your physician has requested that you go to www.startemmi.com and enter the access code given to you at your visit today. This web site gives a general overview about your procedure. However, you should still follow specific instructions given to you by our office regarding your preparation for the procedure.  If you are age 105 or older, your body mass index should be between 23-30. Your Body mass index is 23.46 kg/m. If this is out of the aforementioned range listed, please consider follow up with your Primary Care Provider.  If you are age 34 or younger, your body mass index should be between 19-25. Your Body mass index is 23.46 kg/m. If this is out of the aformentioned range listed, please consider follow up with your Primary Care Provider.

## 2016-04-15 NOTE — Telephone Encounter (Signed)
Patient called back.  Gave MD response on labs.  Patient states he seen Dr. Hilarie Fredrickson yesterday in regard to iron.

## 2016-04-29 ENCOUNTER — Ambulatory Visit (INDEPENDENT_AMBULATORY_CARE_PROVIDER_SITE_OTHER): Payer: Medicare Other | Admitting: Podiatry

## 2016-04-29 ENCOUNTER — Encounter: Payer: Self-pay | Admitting: Podiatry

## 2016-04-29 VITALS — BP 148/57 | HR 63

## 2016-04-29 DIAGNOSIS — B351 Tinea unguium: Secondary | ICD-10-CM | POA: Diagnosis not present

## 2016-04-29 DIAGNOSIS — M79671 Pain in right foot: Secondary | ICD-10-CM

## 2016-04-29 DIAGNOSIS — M79672 Pain in left foot: Secondary | ICD-10-CM | POA: Diagnosis not present

## 2016-04-29 DIAGNOSIS — E1142 Type 2 diabetes mellitus with diabetic polyneuropathy: Secondary | ICD-10-CM

## 2016-04-29 NOTE — Progress Notes (Signed)
Subjective:  82 year old diabetic male presents requesting toe nails and calluses trimmed.  Calluses and toe nails are painful. Blood sugar is under control.   Objective: Dermatologic: Thick mycotic nails x 10.  Positive of hyperkeratosis at medial border of ungual labia.  Plantar callus under the MPJ right.  No open lesions. No edema or erythema.  Inter digital keratoma 1st and 2nd digit at contact surface left foot, painful.  Vascular: All pedal pulses are palpable. Neurologic: All epicritic and tactile sensations grossly intact. Severe hallux valgus with bunion bilateral.  Severely contracted 2nd digit left.   Assessment: Diabetic neuropathy.  Hallux valgus with bunion bilateral.  Hammer toe deformity 2nd left.  Plantar and digital calluses, painful. Mycotic nails x 10.   Plan: Debrided all corns and calluses and nails.  Patient will return in 3 months for palliation

## 2016-04-29 NOTE — Patient Instructions (Signed)
Seen for hypertrophic nails and calluses. All nails and calluses debrided. Return in 3 months or as needed.

## 2016-05-04 DIAGNOSIS — H9123 Sudden idiopathic hearing loss, bilateral: Secondary | ICD-10-CM | POA: Diagnosis not present

## 2016-05-07 DIAGNOSIS — H9123 Sudden idiopathic hearing loss, bilateral: Secondary | ICD-10-CM | POA: Diagnosis not present

## 2016-05-07 DIAGNOSIS — H903 Sensorineural hearing loss, bilateral: Secondary | ICD-10-CM | POA: Diagnosis not present

## 2016-05-12 DIAGNOSIS — H40013 Open angle with borderline findings, low risk, bilateral: Secondary | ICD-10-CM | POA: Diagnosis not present

## 2016-05-12 DIAGNOSIS — E119 Type 2 diabetes mellitus without complications: Secondary | ICD-10-CM | POA: Diagnosis not present

## 2016-05-12 DIAGNOSIS — H04123 Dry eye syndrome of bilateral lacrimal glands: Secondary | ICD-10-CM | POA: Diagnosis not present

## 2016-05-12 DIAGNOSIS — H43821 Vitreomacular adhesion, right eye: Secondary | ICD-10-CM | POA: Diagnosis not present

## 2016-05-12 DIAGNOSIS — H10413 Chronic giant papillary conjunctivitis, bilateral: Secondary | ICD-10-CM | POA: Diagnosis not present

## 2016-05-13 DIAGNOSIS — M4692 Unspecified inflammatory spondylopathy, cervical region: Secondary | ICD-10-CM | POA: Diagnosis not present

## 2016-05-13 DIAGNOSIS — M542 Cervicalgia: Secondary | ICD-10-CM | POA: Diagnosis not present

## 2016-05-13 DIAGNOSIS — I10 Essential (primary) hypertension: Secondary | ICD-10-CM | POA: Diagnosis not present

## 2016-05-13 DIAGNOSIS — M47812 Spondylosis without myelopathy or radiculopathy, cervical region: Secondary | ICD-10-CM | POA: Diagnosis not present

## 2016-05-15 DIAGNOSIS — H9123 Sudden idiopathic hearing loss, bilateral: Secondary | ICD-10-CM | POA: Diagnosis not present

## 2016-05-18 ENCOUNTER — Encounter: Payer: Self-pay | Admitting: Internal Medicine

## 2016-05-18 ENCOUNTER — Ambulatory Visit (AMBULATORY_SURGERY_CENTER): Payer: Medicare Other | Admitting: Internal Medicine

## 2016-05-18 VITALS — BP 110/62 | HR 51 | Temp 97.8°F | Resp 17 | Ht 72.05 in | Wt 175.0 lb

## 2016-05-18 DIAGNOSIS — Z86010 Personal history of colonic polyps: Secondary | ICD-10-CM | POA: Diagnosis not present

## 2016-05-18 DIAGNOSIS — D124 Benign neoplasm of descending colon: Secondary | ICD-10-CM

## 2016-05-18 DIAGNOSIS — D649 Anemia, unspecified: Secondary | ICD-10-CM | POA: Diagnosis not present

## 2016-05-18 DIAGNOSIS — K3189 Other diseases of stomach and duodenum: Secondary | ICD-10-CM | POA: Diagnosis not present

## 2016-05-18 DIAGNOSIS — D508 Other iron deficiency anemias: Secondary | ICD-10-CM | POA: Diagnosis not present

## 2016-05-18 DIAGNOSIS — K2950 Unspecified chronic gastritis without bleeding: Secondary | ICD-10-CM | POA: Diagnosis not present

## 2016-05-18 MED ORDER — SODIUM CHLORIDE 0.9 % IV SOLN: 500.0000 mL | INTRAVENOUS | Status: DC

## 2016-05-18 NOTE — Patient Instructions (Signed)
Upper endoscopy: hiatal hernia, biopsies taken to evaluate for celiac disease. Colonoscopy: diverticulosis, hemorrhoids and 1 polyp found, removed and sent to pathology.   Await pathology for final recommendations.   YOU HAD AN ENDOSCOPIC PROCEDURE TODAY AT [facility]:   Refer to the procedure report that was given to you for any specific questions about what was found during the examination.  If the procedure report does not answer your questions, please call your gastroenterologist to clarify.  If you requested that your care partner not be given the details of your procedure findings, then the procedure report has been included in a sealed envelope for you to review at your convenience later.  YOU SHOULD EXPECT: Some feelings of bloating in the abdomen. Passage of more gas than usual.  Walking can help get rid of the air that was put into your GI tract during the procedure and reduce the bloating. If you had a lower endoscopy (such as a colonoscopy or flexible sigmoidoscopy) you may notice spotting of blood in your stool or on the toilet paper. If you underwent a bowel prep for your procedure, you may not have a normal bowel movement for a few days.  Please Note:  You might notice some irritation and congestion in your nose or some drainage.  This is from the oxygen used during your procedure.  There is no need for concern and it should clear up in a day or so.  SYMPTOMS TO REPORT IMMEDIATELY:   Following lower endoscopy (colonoscopy or flexible sigmoidoscopy):  Excessive amounts of blood in the stool  Significant tenderness or worsening of abdominal pains  Swelling of the abdomen that is new, acute  Fever of 100F or higher   Following upper endoscopy (EGD)  Vomiting of blood or coffee ground material  New chest pain or pain under the shoulder blades  Painful or persistently difficult swallowing  New shortness of breath  Fever of 100F or higher  Black, tarry-looking  stools  For urgent or emergent issues, a gastroenterologist can be reached at any hour by calling [phone].   DIET:  We do recommend a small meal at first, but then you may proceed to your regular diet.  Drink plenty of fluids but you should avoid alcoholic beverages for 24 hours.  ACTIVITY:  You should plan to take it easy for the rest of today and you should NOT DRIVE or use heavy machinery until tomorrow (because of the sedation medicines used during the test).    FOLLOW UP: Our staff will call the number listed on your records the next business day following your procedure to check on you and address any questions or concerns that you may have regarding the information given to you following your procedure. If we do not reach you, we will leave a message.  However, if you are feeling well and you are not experiencing any problems, there is no need to return our call.  We will assume that you have returned to your regular daily activities without incident.  If any biopsies were taken you will be contacted by phone or by letter within the next 1-3 weeks.  Please call us at [phone] if you have not heard about the biopsies in 3 weeks.    SIGNATURES/CONFIDENTIALITY: You and/or your care partner have signed paperwork which will be entered into your electronic medical record.  These signatures attest to the fact that that the information above on your After Visit Summary has been  reviewed and is understood.  Full responsibility of the confidentiality of this discharge information lies with you and/or your care-partner.

## 2016-05-18 NOTE — Op Note (Signed)
[facility] Patient Name: Nicholas Patton Procedure Date: 05/18/2016 12:09 PM MRN: [id number] Endoscopist: Jerene Bears , MD Age: 82 Referring MD:  Date of Birth: 28-Apr-1934 Gender: Male Account #: [id number] Procedure:                Upper GI endoscopy Indications:              Iron deficiency anemia, Gastro-esophageal reflux                            disease Medicines:                Monitored Anesthesia Care Procedure:                Pre-Anesthesia Assessment:                           - Prior to the procedure, a History and Physical                            was performed, and patient medications and                            allergies were reviewed. The patient's tolerance of                            previous anesthesia was also reviewed. The risks                            and benefits of the procedure and the sedation                            options and risks were discussed with the patient.                            All questions were answered, and informed consent                            was obtained. Prior Anticoagulants: The patient has                            taken no previous anticoagulant or antiplatelet                            agents. ASA Grade Assessment: III - A patient with                            severe systemic disease. After reviewing the risks                            and benefits, the patient was deemed in                            satisfactory condition to undergo the procedure.  After obtaining informed consent, the endoscope was                            passed under direct vision. Throughout the                            procedure, the patient's blood pressure, pulse, and                            oxygen saturations were monitored continuously. The                            Model GIF-HQ190 [id number]) scope was introduced                            through the mouth, and advanced to the second  part                            of duodenum. The upper GI endoscopy was                            accomplished without difficulty. The patient                            tolerated the procedure well. Scope In: Scope Out: Findings:                 The examined esophagus was normal.                           A 2 cm hiatal hernia was present.                           The entire examined stomach was normal. Biopsies                            were taken with a cold forceps for histology and                            Helicobacter pylori testing (body, antrum,                            incisura).                           A single diminutive angiodysplastic lesion without                            bleeding was found in the second portion of the                            duodenum.                           The exam of the duodenum was otherwise normal.  Biopsies for histology were taken with a cold                            forceps in the second portion of the duodenum for                            evaluation of celiac disease. Complications:            No immediate complications. Estimated Blood Loss:     Estimated blood loss was minimal. Impression:               - Normal esophagus.                           - 2 cm hiatal hernia.                           - Normal stomach. Biopsied.                           - A single non-bleeding angiodysplastic lesion in                            the duodenum.                           - Biopsies were taken with a cold forceps for                            evaluation of celiac disease. Recommendation:           - Patient has a contact number available for                            emergencies. The signs and symptoms of potential                            delayed complications were discussed with the                            patient. Return to normal activities tomorrow.                            Written discharge  instructions were provided to the                            patient.                           - Resume previous diet.                           - Continue present medications. Continue oral iron                            with plans to repeat CBC and iron studies in at 3  months.                           - Await pathology results.                           - See the other procedure note for documentation of                            additional recommendations. Jerene Bears, MD 05/18/2016 1:21:27 PM This report has been signed electronically.

## 2016-05-18 NOTE — Progress Notes (Signed)
Called to room to assist during endoscopic procedure.  Patient ID and intended procedure confirmed with present staff. Received instructions for my participation in the procedure from the performing physician.

## 2016-05-18 NOTE — Progress Notes (Signed)
To PACU VSS Report to RN

## 2016-05-18 NOTE — Op Note (Signed)
[name] Patient Name: Nicholas Patton Procedure Date: 05/18/2016 12:01 PM MRN: [id number] Endoscopist: Jerene Bears , MD Age: 82 Referring MD:  Date of Birth: 01-Mar-1935 Gender: Male Account #: [id number] Procedure:                Colonoscopy Indications:              new iron deficiency anemia, Personal history of                            colonic polyps Medicines:                Monitored Anesthesia Care Procedure:                Pre-Anesthesia Assessment:                           - Prior to the procedure, a History and Physical                            was performed, and patient medications and                            allergies were reviewed. The patient's tolerance of                            previous anesthesia was also reviewed. The risks                            and benefits of the procedure and the sedation                            options and risks were discussed with the patient.                            All questions were answered, and informed consent                            was obtained. Prior Anticoagulants: The patient has                            taken no previous anticoagulant or antiplatelet                            agents. ASA Grade Assessment: III - A patient with                            severe systemic disease. After reviewing the risks                            and benefits, the patient was deemed in                            satisfactory condition to undergo the procedure.  After obtaining informed consent, the colonoscope                            was passed under direct vision. Throughout the                            procedure, the patient's blood pressure, pulse, and                            oxygen saturations were monitored continuously. The                            Model CF-HQ190L [id number]) scope was introduced                            through the anus and advanced to the the  terminal                            ileum. The colonoscopy was performed without                            difficulty. The patient tolerated the procedure                            well. The quality of the bowel preparation was                            excellent. The terminal ileum, ileocecal valve,                            appendiceal orifice, and rectum were photographed. Scope In: 1:00:42 PM Scope Out: 1:11:56 PM Scope Withdrawal Time: 0 hours 8 minutes 43 seconds  Total Procedure Duration: 0 hours 11 minutes 14 seconds  Findings:                 The digital rectal exam was normal.                           A 4 mm polyp was found in the descending colon. The                            polyp was sessile. The polyp was removed with a                            cold snare. Resection and retrieval were complete.                           Multiple small and large-mouthed diverticula were                            found from ascending colon to sigmoid colon.                           Internal hemorrhoids were found during  retroflexion. The hemorrhoids were small. Complications:            No immediate complications. Estimated Blood Loss:     Estimated blood loss was minimal. Impression:               - One 4 mm polyp in the descending colon, removed                            with a cold snare. Resected and retrieved.                           - Moderate diverticulosis from ascending colon to                            sigmoid colon.                           - Small internal hemorrhoids. Recommendation:           - Patient has a contact number available for                            emergencies. The signs and symptoms of potential                            delayed complications were discussed with the                            patient. Return to normal activities tomorrow.                            Written discharge instructions were provided to the                             patient.                           - Resume previous diet.                           - Continue present medications. Continue iron                            replacement and follow-up iron labs. Suspect slow                            blood loss from angiodysplastic lesions in the                            small bowel.                           - Await pathology results.                           - No recommendation at this time regarding repeat  colonoscopy due to age.                           - If after iron replacement there is persistent                            anemia or further decline in Hgb then consider                            video capsule endoscopy. Jerene Bears, MD 05/18/2016 1:27:30 PM This report has been signed electronically.

## 2016-05-19 ENCOUNTER — Other Ambulatory Visit: Payer: Self-pay

## 2016-05-19 ENCOUNTER — Telehealth: Payer: Self-pay

## 2016-05-19 DIAGNOSIS — D509 Iron deficiency anemia, unspecified: Secondary | ICD-10-CM

## 2016-05-19 NOTE — Telephone Encounter (Signed)
  Follow up Call-  Call back number 05/18/2016 12/13/2014  Post procedure Call Back phone  # [phone]  Permission to leave phone message Yes Yes  Some recent data might be hidden     Patient questions:  Do you have a fever, pain , or abdominal swelling? No. Pain Score  0 *  Have you tolerated food without any problems? Yes.    Have you been able to return to your normal activities? Yes.    Do you have any questions about your discharge instructions: Diet   No. Medications  No. Follow up visit  No.  Do you have questions or concerns about your Care? No.  Actions: * If pain score is 4 or above: No action needed, pain <4.

## 2016-05-21 ENCOUNTER — Encounter: Payer: Self-pay | Admitting: Internal Medicine

## 2016-05-25 DIAGNOSIS — H9123 Sudden idiopathic hearing loss, bilateral: Secondary | ICD-10-CM | POA: Diagnosis not present

## 2016-05-25 DIAGNOSIS — H903 Sensorineural hearing loss, bilateral: Secondary | ICD-10-CM | POA: Diagnosis not present

## 2016-05-27 ENCOUNTER — Ambulatory Visit (INDEPENDENT_AMBULATORY_CARE_PROVIDER_SITE_OTHER): Payer: Medicare Other | Admitting: Podiatry

## 2016-05-27 ENCOUNTER — Telehealth: Payer: Self-pay | Admitting: Internal Medicine

## 2016-05-27 ENCOUNTER — Encounter: Payer: Self-pay | Admitting: Podiatry

## 2016-05-27 DIAGNOSIS — B351 Tinea unguium: Secondary | ICD-10-CM | POA: Diagnosis not present

## 2016-05-27 DIAGNOSIS — E1142 Type 2 diabetes mellitus with diabetic polyneuropathy: Secondary | ICD-10-CM

## 2016-05-27 DIAGNOSIS — L600 Ingrowing nail: Secondary | ICD-10-CM | POA: Diagnosis not present

## 2016-05-27 NOTE — Progress Notes (Signed)
Subjective:  82 year old diabetic male presents with a concern on dark discolored toe nail and dry skin on 1st and 2nd toe right.  Objective: Dermatologic: Positive of hyperkeratosis at medial border of ungual labia 2nd toe right.  Dark discolored toe nail on both border right great toe without acute lesions. Vascular: All pedal pulses are palpable. Neurologic: All epicritic and tactile sensations grossly intact. Severe hallux valgus with bunion bilateral.  Severely contracted 2nd digit left.   Assessment: Diabetic neuropathy.  Dry ungual labia 2nd toe right following previous ingrown nail with inflamed border. No active inflammation at this time.  Plan: Debrided dry skin from medial nail border on 2nd toe right. Assured no abnormal findings other than hard dry skin from old ingrown nail 2nd right. Patient will return in 3 months for palliation

## 2016-05-27 NOTE — Telephone Encounter (Signed)
Called patient to schedule awv. Patient stated that he would like to give office a call back to schedule appt.

## 2016-05-27 NOTE — Patient Instructions (Signed)
Seen for dry discolored skin and nail right 1st and 2nd. Noted of old dry skin from previous ingrown nail problem. Excess dry skin removed from the 2nd toe. No abnormal finding on the great toe. Return for RFC.

## 2016-06-02 ENCOUNTER — Encounter: Payer: Medicare Other | Admitting: Internal Medicine

## 2016-06-15 DIAGNOSIS — R040 Epistaxis: Secondary | ICD-10-CM | POA: Diagnosis not present

## 2016-06-16 ENCOUNTER — Encounter: Payer: Self-pay | Admitting: Internal Medicine

## 2016-06-17 IMAGING — CR DG CHEST 2V
2 series · 2 of 2 positions shown · non-contrast
Comparison: 05/06/2013

CLINICAL DATA: Shortness of breath.

EXAM:
CHEST  2 VIEW

[chest pa]
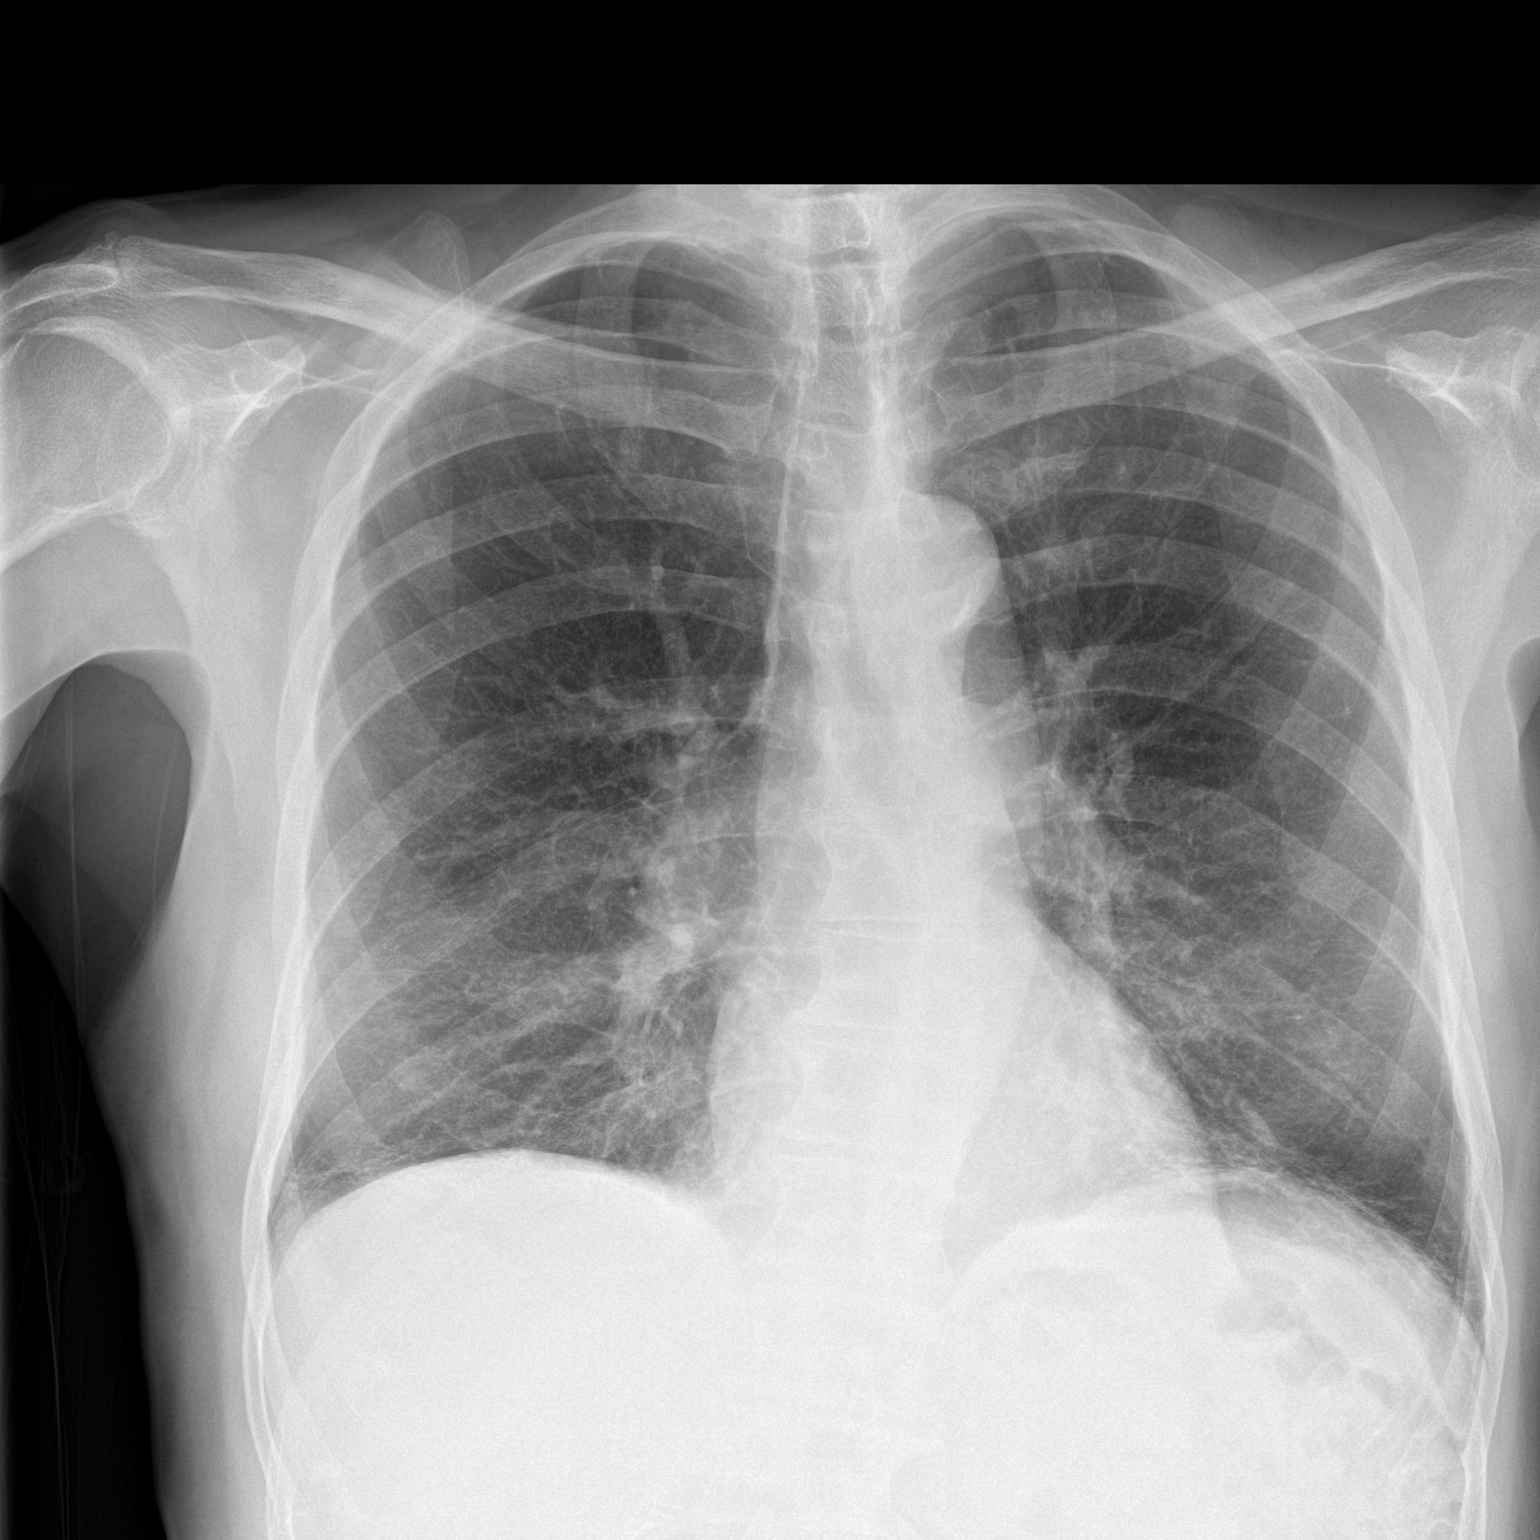

[chest lat]
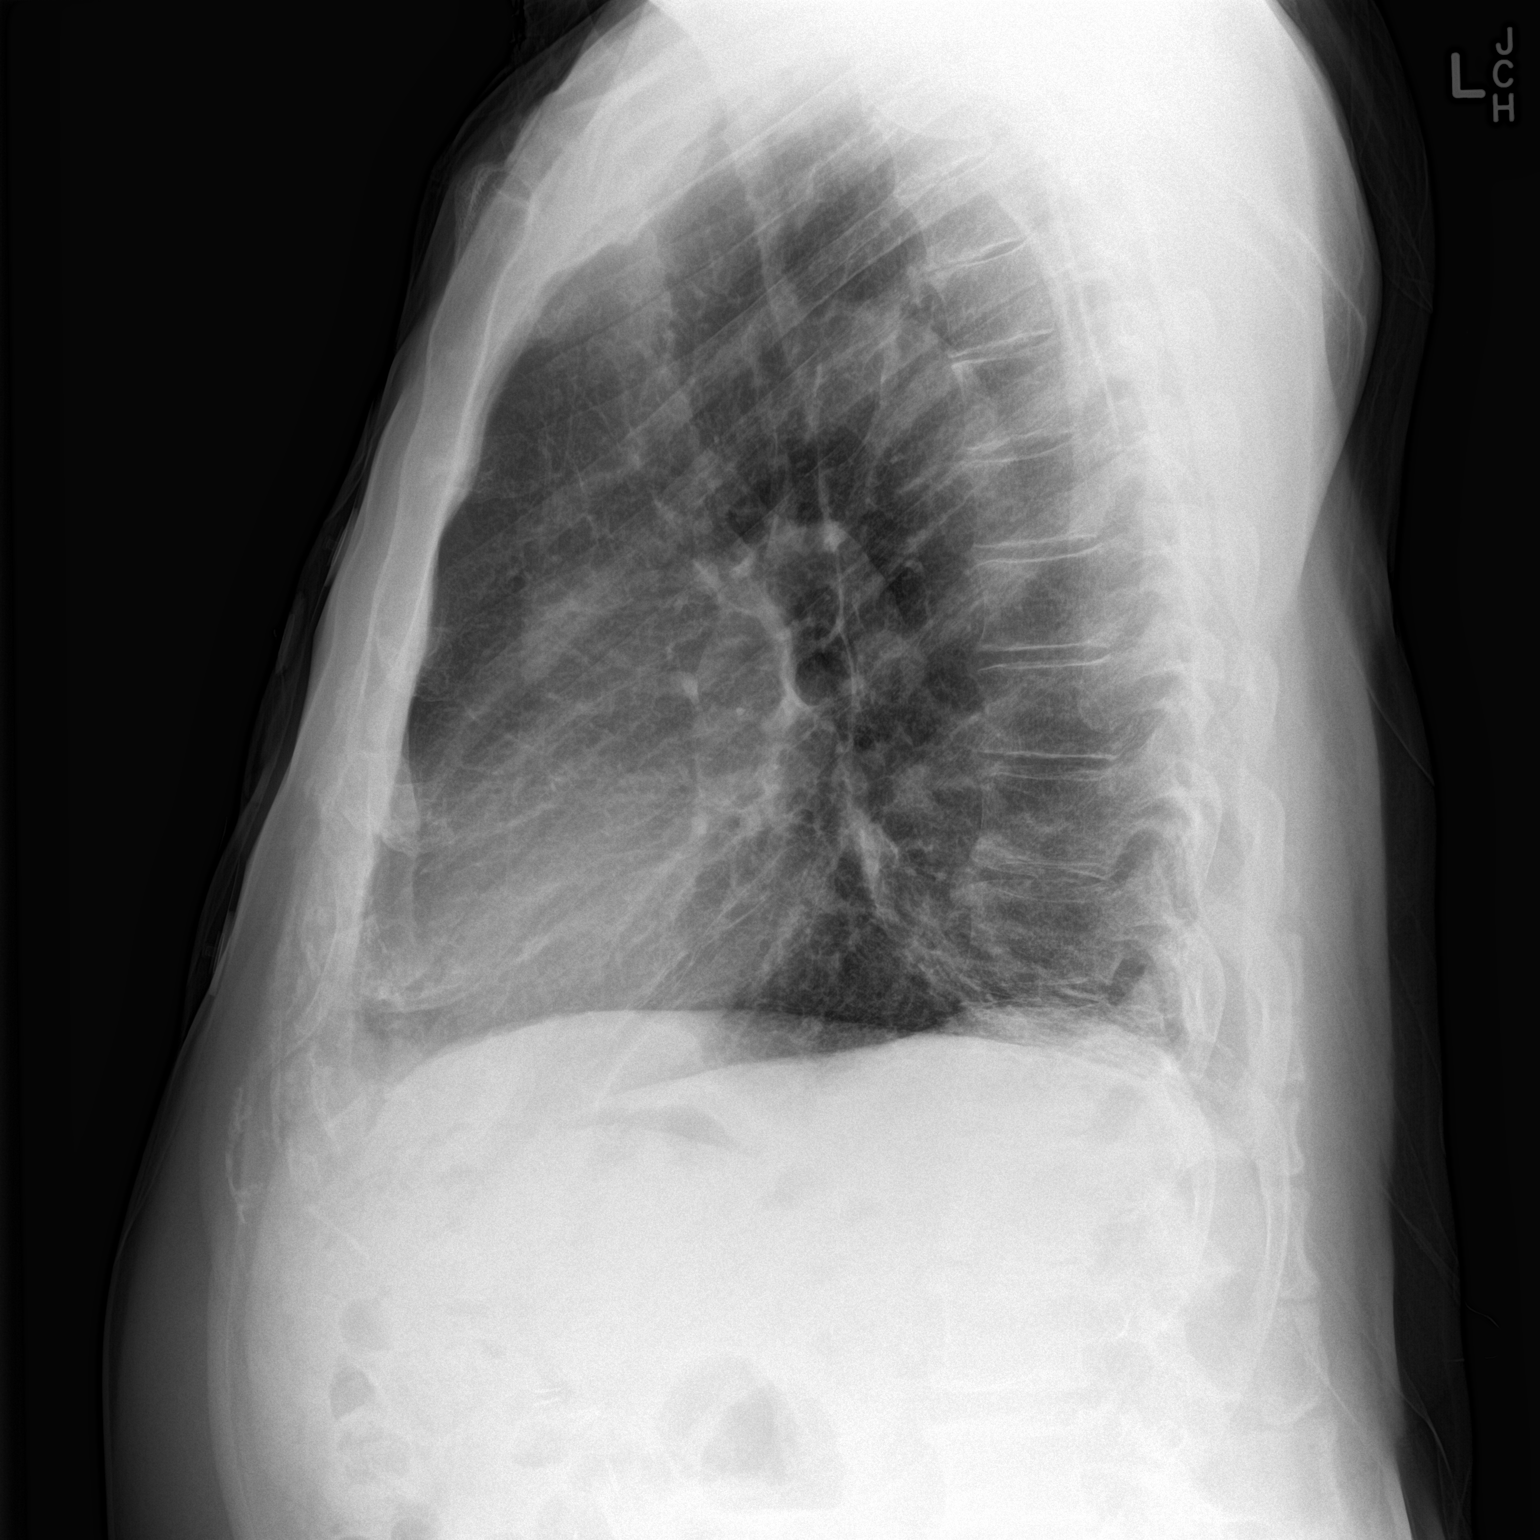

[2 of 2 positions shown; findings below may reference images not displayed]

FINDINGS: Normal heart size. There is no pleural effusion or edema. There is
no airspace consolidation identified. Interstitial coarsening is
identified bilaterally. Basilar predominant interstitial
reticulation is noted.
IMPRESSION: 1. No acute cardiopulmonary abnormalities.

## 2016-07-28 ENCOUNTER — Ambulatory Visit: Payer: Medicare Other | Admitting: Podiatry

## 2016-07-29 ENCOUNTER — Ambulatory Visit (INDEPENDENT_AMBULATORY_CARE_PROVIDER_SITE_OTHER): Payer: Medicare Other | Admitting: Podiatry

## 2016-07-29 ENCOUNTER — Encounter: Payer: Self-pay | Admitting: Podiatry

## 2016-07-29 DIAGNOSIS — M79671 Pain in right foot: Secondary | ICD-10-CM

## 2016-07-29 DIAGNOSIS — E1142 Type 2 diabetes mellitus with diabetic polyneuropathy: Secondary | ICD-10-CM

## 2016-07-29 DIAGNOSIS — B351 Tinea unguium: Secondary | ICD-10-CM

## 2016-07-29 DIAGNOSIS — M79672 Pain in left foot: Secondary | ICD-10-CM

## 2016-07-29 NOTE — Progress Notes (Signed)
Subjective:  82 year old diabetic male presents with a concern on dark discolored toe nail and dry skin on 1st and 2nd toe right. Denies any new problems.  Objective: Dermatologic: Positive of hyperkeratosis at medial border of ungual labia 2nd toe right.  Dark discolored toe nail on both border right great toe without acute lesions. Vascular: All pedal pulses are palpable. Neurologic: All epicritic and tactile sensations grossly intact. Severe hallux valgus with bunion bilateral.  Severely contracted 2nd digit left.   Assessment: Diabetic neuropathy.  Dry ungual labia 2nd toe right following previous ingrown nail with inflamed border. No active inflammation at this time.  Plan: Debrided dry skin from medial nail border on 2nd toe right. Assured no abnormal findings other than hard dry skin from old ingrown nail 2nd right. Patient will return in 3 months for palliation

## 2016-07-29 NOTE — Patient Instructions (Signed)
Seen for hypertrophic nails and calluses. All nails and calluses debrided. Return in 3 months or as needed.  

## 2016-08-14 ENCOUNTER — Other Ambulatory Visit (INDEPENDENT_AMBULATORY_CARE_PROVIDER_SITE_OTHER): Payer: Medicare Other

## 2016-08-14 DIAGNOSIS — D509 Iron deficiency anemia, unspecified: Secondary | ICD-10-CM

## 2016-08-14 LAB — CBC WITH DIFFERENTIAL/PLATELET
Basophils Absolute: 0 10*3/uL (ref 0.0–0.1)
Basophils Relative: 1 % (ref 0.0–3.0)
Eosinophils Absolute: 0.2 10*3/uL (ref 0.0–0.7)
Eosinophils Relative: 3.9 % (ref 0.0–5.0)
HCT: 40 % (ref 39.0–52.0)
Hemoglobin: 12.8 g/dL — ABNORMAL LOW (ref 13.0–17.0)
Lymphocytes Relative: 31.6 % (ref 12.0–46.0)
Lymphs Abs: 1.5 10*3/uL (ref 0.7–4.0)
MCHC: 31.9 g/dL (ref 30.0–36.0)
MCV: 84.9 fl (ref 78.0–100.0)
Monocytes Absolute: 0.6 10*3/uL (ref 0.1–1.0)
Monocytes Relative: 12.2 % — ABNORMAL HIGH (ref 3.0–12.0)
Neutro Abs: 2.4 10*3/uL (ref 1.4–7.7)
Neutrophils Relative %: 51.3 % (ref 43.0–77.0)
Platelets: 208 10*3/uL (ref 150.0–400.0)
RBC: 4.72 Mil/uL (ref 4.22–5.81)
RDW: 16.4 % — ABNORMAL HIGH (ref 11.5–15.5)
WBC: 4.6 10*3/uL (ref 4.0–10.5)

## 2016-08-14 LAB — IBC PANEL
Iron: 52 ug/dL (ref 42–165)
Saturation Ratios: 13.3 % — ABNORMAL LOW (ref 20.0–50.0)
Transferrin: 279 mg/dL (ref 212.0–360.0)

## 2016-08-14 LAB — FERRITIN: Ferritin: 38.9 ng/mL (ref 22.0–322.0)

## 2016-08-17 ENCOUNTER — Other Ambulatory Visit: Payer: Self-pay

## 2016-08-17 DIAGNOSIS — D509 Iron deficiency anemia, unspecified: Secondary | ICD-10-CM

## 2016-09-10 DIAGNOSIS — H4322 Crystalline deposits in vitreous body, left eye: Secondary | ICD-10-CM | POA: Diagnosis not present

## 2016-09-10 DIAGNOSIS — H353131 Nonexudative age-related macular degeneration, bilateral, early dry stage: Secondary | ICD-10-CM | POA: Diagnosis not present

## 2016-09-10 DIAGNOSIS — E119 Type 2 diabetes mellitus without complications: Secondary | ICD-10-CM | POA: Diagnosis not present

## 2016-09-10 DIAGNOSIS — Z961 Presence of intraocular lens: Secondary | ICD-10-CM | POA: Diagnosis not present

## 2016-09-10 DIAGNOSIS — H04123 Dry eye syndrome of bilateral lacrimal glands: Secondary | ICD-10-CM | POA: Diagnosis not present

## 2016-09-10 DIAGNOSIS — H40013 Open angle with borderline findings, low risk, bilateral: Secondary | ICD-10-CM | POA: Diagnosis not present

## 2016-10-07 ENCOUNTER — Telehealth: Payer: Self-pay

## 2016-10-07 ENCOUNTER — Other Ambulatory Visit: Payer: Self-pay | Admitting: Internal Medicine

## 2016-10-07 ENCOUNTER — Other Ambulatory Visit (INDEPENDENT_AMBULATORY_CARE_PROVIDER_SITE_OTHER): Payer: Medicare Other

## 2016-10-07 ENCOUNTER — Ambulatory Visit (INDEPENDENT_AMBULATORY_CARE_PROVIDER_SITE_OTHER): Payer: Medicare Other | Admitting: Internal Medicine

## 2016-10-07 ENCOUNTER — Encounter: Payer: Self-pay | Admitting: Internal Medicine

## 2016-10-07 VITALS — BP 126/70 | HR 60 | Ht 72.25 in | Wt 175.0 lb

## 2016-10-07 DIAGNOSIS — R972 Elevated prostate specific antigen [PSA]: Secondary | ICD-10-CM

## 2016-10-07 DIAGNOSIS — I10 Essential (primary) hypertension: Secondary | ICD-10-CM

## 2016-10-07 DIAGNOSIS — E785 Hyperlipidemia, unspecified: Secondary | ICD-10-CM | POA: Diagnosis not present

## 2016-10-07 DIAGNOSIS — E1140 Type 2 diabetes mellitus with diabetic neuropathy, unspecified: Secondary | ICD-10-CM

## 2016-10-07 DIAGNOSIS — J309 Allergic rhinitis, unspecified: Secondary | ICD-10-CM

## 2016-10-07 LAB — BASIC METABOLIC PANEL
BUN: 21 mg/dL (ref 6–23)
CO2: 28 mEq/L (ref 19–32)
Calcium: 9.9 mg/dL (ref 8.4–10.5)
Chloride: 104 mEq/L (ref 96–112)
Creatinine, Ser: 1.46 mg/dL (ref 0.40–1.50)
GFR: 59.71 mL/min — ABNORMAL LOW (ref >60.00)
Glucose, Bld: 109 mg/dL — ABNORMAL HIGH (ref 70–99)
Potassium: 4.3 mEq/L (ref 3.5–5.1)
Sodium: 142 mEq/L (ref 135–145)

## 2016-10-07 LAB — HEPATIC FUNCTION PANEL
ALT: 12 U/L (ref 0–53)
AST: 18 U/L (ref 0–37)
Albumin: 4.5 g/dL (ref 3.5–5.2)
Alkaline Phosphatase: 94 U/L (ref 39–117)
Bilirubin, Direct: 0.1 mg/dL (ref 0.0–0.3)
Total Bilirubin: 0.6 mg/dL (ref 0.2–1.2)
Total Protein: 6.8 g/dL (ref 6.0–8.3)

## 2016-10-07 LAB — LIPID PANEL
Cholesterol: 138 mg/dL (ref 0–200)
HDL: 63.5 mg/dL (ref >39.00)
LDL Cholesterol: 59 mg/dL (ref 0–99)
NonHDL: 74.04
Total CHOL/HDL Ratio: 2
Triglycerides: 73 mg/dL (ref 0.0–149.0)
VLDL: 14.6 mg/dL (ref 0.0–40.0)

## 2016-10-07 LAB — PSA: PSA: 4.46 ng/mL — ABNORMAL HIGH (ref 0.10–4.00)

## 2016-10-07 LAB — HEMOGLOBIN A1C: Hgb A1c MFr Bld: 6.4 % (ref 4.6–6.5)

## 2016-10-07 NOTE — Telephone Encounter (Signed)
-----  Message from [name], MD sent at 10/07/2016 12:14 PM EDT ----- See below

## 2016-10-07 NOTE — Telephone Encounter (Signed)
Called pt, LVM.   

## 2016-10-07 NOTE — Progress Notes (Signed)
Subjective:    Patient ID: Nicholas Patton, male    DOB: 1934-10-23, 82 y.o.   MRN: [id number]  HPI  Here to f/u; overall doing ok,  Pt denies chest pain, increasing sob or doe, wheezing, orthopnea, PND, increased LE swelling, palpitations, dizziness or syncope.  Pt denies new neurological symptoms such as new headache, or facial or extremity weakness or numbness.  Pt denies polydipsia, polyuria, or low sugar episode.  Pt states overall good compliance with meds, mostly trying to follow appropriate diet, with wt overall stable, no regular exercise  Has no further nosebleeds since started humidifier at home. Does have several wks ongoing nasal allergy symptoms with clearish congestion, itch and sneezing, without fever, pain, ST, cough, swelling or wheezing. Mentions no real change in chronic persistent left hearing loss, has declined hearing aid after ENT eval in the past.  Also with chronic left neck pain s/p cervical surgury and gabapentin, some better with tylenol arthritis prn.  Denies urinary symptoms such as dysuria, frequency, urgency, flank pain, hematuria or n/v, fever, chills. Due for PSA Past Medical History:  Diagnosis Date  . Anemia   . Arthritis   . Cataract    REMOVED BILATERAL  . Colon polyps   . DIABETES MELLITUS, TYPE II    takes Metformin and Glimepiride daily  . Diverticulosis   . GERD (gastroesophageal reflux disease)    takes Protonix daily  . History of colon polyps   . HYPERLIPIDEMIA    takes Atorvastatin daily  . HYPERTENSION    takes Amlodipine and Hyzaar daily  . Ileus following gastrointestinal surgery 07/22/2012  . Joint pain   . Peripheral neuropathy   . Peripheral neuropathy   . PERIPHERAL NEUROPATHY, FEET 10/06/2007  . VITAMIN B12 DEFICIENCY 09/06/2008  . VITAMIN D DEFICIENCY 01/16/2010   takes Vitamin D daily   Past Surgical History:  Procedure Laterality Date  . CATARACT EXTRACTION     both eyes  . CHOLECYSTECTOMY N/A 07/18/2012   Procedure:  LAPAROSCOPIC CHOLECYSTECTOMY WITH INTRAOPERATIVE CHOLANGIOGRAM;  Surgeon: Zenovia Jarred, MD;  Location: [facility];  Service: General;  Laterality: N/A;  . COLONOSCOPY    . POLYPECTOMY    . POSTERIOR CERVICAL FUSION/FORAMINOTOMY N/A 06/27/2015   Procedure: Posterior Cervical Fusion with lateral mass fixation Cervical four to cervical seven;  Surgeon: Eustace Moore, MD;  Location: [facility];  Service: Neurosurgery;  Laterality: N/A;  . TOTAL KNEE ARTHROPLASTY  2001   Left  . TURP VAPORIZATION      reports that he has quit smoking. His smoking use included Cigarettes. He smoked 0.00 packs per day. He has never used smokeless tobacco. He reports that he does not drink alcohol or use drugs. family history includes Diabetes Mellitus II in his mother; Lung cancer in his brother. No Known Allergies Current Outpatient Prescriptions on File Prior to Visit  Medication Sig Dispense Refill  . amLODipine (NORVASC) 5 MG tablet Take 1 tablet (5 mg total) by mouth daily. 90 tablet 3  . aspirin 81 MG EC tablet Take 81 mg by mouth daily.      Marland Kitchen atorvastatin (LIPITOR) 40 MG tablet Take 1 tablet (40 mg total) by mouth daily. 90 tablet 3  . Cholecalciferol (VITAMIN D3) 1000 UNITS CAPS Take 1 capsule (1,000 Units total) by mouth daily. 90 capsule 3  . ferrous sulfate 325 (65 FE) MG tablet Take 325 mg by mouth daily with breakfast.    . glimepiride (AMARYL) 1 MG tablet Take 0.5  tablets (0.5 mg total) by mouth 2 (two) times daily. 90 tablet 3  . losartan-hydrochlorothiazide (HYZAAR) 100-25 MG tablet Take 1 tablet by mouth daily. 90 tablet 3  . metFORMIN (GLUCOPHAGE) 500 MG tablet Take 2 tablets by mouth  twice a day with meals 360 tablet 3  . Multiple Vitamins-Minerals (MULTIVITAMIN PO) Take 1 tablet by mouth daily.    . pantoprazole (PROTONIX) 40 MG tablet Take 1 tablet (40 mg total) by mouth daily. 90 tablet 3  . tamsulosin (FLOMAX) 0.4 MG CAPS capsule Take 1 capsule (0.4 mg total) by mouth daily. 90 capsule 3    No current facility-administered medications on file prior to visit.     Review of Systems  Constitutional: Negative for other unusual diaphoresis or sweats HENT: Negative for ear discharge or swelling Eyes: Negative for other worsening visual disturbances Respiratory: Negative for stridor or other swelling  Gastrointestinal: Negative for worsening distension or other blood Genitourinary: Negative for retention or other urinary change Musculoskeletal: Negative for other MSK pain or swelling Skin: Negative for color change or other new lesions Neurological: Negative for worsening tremors and other numbness  Psychiatric/Behavioral: Negative for worsening agitation or other fatigue All other system neg per pt    Objective:   Physical Exam BP 126/70   Pulse 60   Ht 6' 0.25" (1.835 m)   Wt 175 lb (79.4 kg)   SpO2 96%   BMI 23.57 kg/m  VS noted, not ill appearing Constitutional: Pt appears in NAD HENT: Head: NCAT.  Right Ear: External ear normal.  Left Ear: External ear normal.  Eyes: . Pupils are equal, round, and reactive to light. Conjunctivae and EOM are normal Nose: without d/c or deformity Bilat tm's with mild erythema.  Max sinus areas non tender.  Pharynx with mild erythema, no exudate Neck: Neck supple. Gross normal ROM Cardiovascular: Normal rate and regular rhythm.   Pulmonary/Chest: Effort normal and breath sounds without rales or wheezing.  Abd:  Soft, NT, ND, + BS, no organomegaly Neurological: Pt is alert. At baseline orientation, motor grossly intact. + severe left hearing loss Skin: Skin is warm. No rashes, other new lesions, no LE edema Psychiatric: Pt behavior is normal without agitation  No other exam findings    Assessment & Plan:

## 2016-10-07 NOTE — Patient Instructions (Signed)
Please continue all other medications as before, and refills have been done if requested.  Please have the pharmacy call with any other refills you may need.  Please continue your efforts at being more active, low cholesterol diabetic diet, and weight control.  Please keep your appointments with your specialists as you may have planned  Please go to the LAB in the Basement (turn left off the elevator) for the tests to be done today  You will be contacted by phone if any changes need to be made immediately.  Otherwise, you will receive a letter about your results with an explanation, but please check with MyChart first.  Please remember to sign up for MyChart if you have not done so, as this will be important to you in the future with finding out test results, communicating by private email, and scheduling acute appointments online when needed.  Please return in 6 months, or sooner

## 2016-10-10 NOTE — Assessment & Plan Note (Signed)
Lab Results  Component Value Date   LDLCALC 59 10/07/2016  stable overall by history and exam, recent data reviewed with pt, and pt to continue medical treatment as before,  to f/u any worsening symptoms or concerns

## 2016-10-10 NOTE — Assessment & Plan Note (Signed)
Mild to mod, for zyrtec prn,  to f/u any worsening symptoms or concerns 

## 2016-10-10 NOTE — Assessment & Plan Note (Signed)
D/w pt, will need f/u psa, and to urology if elevated

## 2016-10-10 NOTE — Assessment & Plan Note (Signed)
Lab Results  Component Value Date   HGBA1C 6.4 10/07/2016  ,stable overall by history and exam, recent data reviewed with pt, and pt to continue medical treatment as before,  to f/u any worsening symptoms or concerns

## 2016-10-10 NOTE — Assessment & Plan Note (Signed)
stable overall by history and exam, recent data reviewed with pt, and pt to continue medical treatment as before,  to f/u any worsening symptoms or concerns BP Readings from Last 3 Encounters:  10/07/16 126/70  05/18/16 110/62  04/29/16 (!) 148/57

## 2016-10-29 ENCOUNTER — Encounter: Payer: Self-pay | Admitting: Podiatry

## 2016-10-29 ENCOUNTER — Ambulatory Visit: Payer: Medicare Other | Admitting: Podiatry

## 2016-10-29 ENCOUNTER — Ambulatory Visit (INDEPENDENT_AMBULATORY_CARE_PROVIDER_SITE_OTHER): Payer: Medicare Other | Admitting: Podiatry

## 2016-10-29 DIAGNOSIS — E1142 Type 2 diabetes mellitus with diabetic polyneuropathy: Secondary | ICD-10-CM

## 2016-10-29 DIAGNOSIS — B351 Tinea unguium: Secondary | ICD-10-CM

## 2016-10-29 NOTE — Progress Notes (Signed)
Subjective:  82 year old diabetic male presents requesting toe nails and calluses around toe nails and 2nd toe left foot trimmed.  Denies any new problems.  Objective: Dermatologic: Positive of hyperkeratosis at medial border of ungual labia2nd toe right.  Dystrophic hypertrophic nails x 10. Digital corn at contact surface of the 1st and 2nd toe left. Vascular: All pedal pulses are palpable. Neurologic: All epicritic and tactile sensations grossly intact. Severe hallux valgus with bunion bilateral.  Severely contracted 2nd digit left.   Assessment: Diabetic neuropathy.  Onychomycosis x 10. Digital corn 1st and 2nd left.  Plan: Debrided all nails, corns, and calluses. Patient will return in 3 months for palliation

## 2016-10-29 NOTE — Patient Instructions (Signed)
Seen for hypertrophic nails. All nails debrided. Return in 3 months or as needed.  

## 2016-11-18 ENCOUNTER — Other Ambulatory Visit (INDEPENDENT_AMBULATORY_CARE_PROVIDER_SITE_OTHER): Payer: Medicare Other

## 2016-11-18 DIAGNOSIS — D509 Iron deficiency anemia, unspecified: Secondary | ICD-10-CM | POA: Diagnosis not present

## 2016-11-18 LAB — CBC WITH DIFFERENTIAL/PLATELET
Basophils Absolute: 0.1 10*3/uL (ref 0.0–0.1)
Basophils Relative: 1.2 % (ref 0.0–3.0)
Eosinophils Absolute: 0.1 10*3/uL (ref 0.0–0.7)
Eosinophils Relative: 2.5 % (ref 0.0–5.0)
HCT: 40.4 % (ref 39.0–52.0)
Hemoglobin: 13.2 g/dL (ref 13.0–17.0)
Lymphocytes Relative: 33.8 % (ref 12.0–46.0)
Lymphs Abs: 1.6 10*3/uL (ref 0.7–4.0)
MCHC: 32.5 g/dL (ref 30.0–36.0)
MCV: 85.9 fl (ref 78.0–100.0)
Monocytes Absolute: 0.5 10*3/uL (ref 0.1–1.0)
Monocytes Relative: 10.7 % (ref 3.0–12.0)
Neutro Abs: 2.5 10*3/uL (ref 1.4–7.7)
Neutrophils Relative %: 51.8 % (ref 43.0–77.0)
Platelets: 188 10*3/uL (ref 150.0–400.0)
RBC: 4.7 Mil/uL (ref 4.22–5.81)
RDW: 16.6 % — ABNORMAL HIGH (ref 11.5–15.5)
WBC: 4.9 10*3/uL (ref 4.0–10.5)

## 2016-11-18 LAB — IBC PANEL
Iron: 56 ug/dL (ref 42–165)
Saturation Ratios: 14.4 % — ABNORMAL LOW (ref 20.0–50.0)
Transferrin: 277 mg/dL (ref 212.0–360.0)

## 2016-11-18 LAB — FERRITIN: Ferritin: 54 ng/mL (ref 22.0–322.0)

## 2016-11-19 ENCOUNTER — Other Ambulatory Visit: Payer: Self-pay

## 2016-11-19 DIAGNOSIS — D509 Iron deficiency anemia, unspecified: Secondary | ICD-10-CM

## 2016-12-01 ENCOUNTER — Ambulatory Visit (INDEPENDENT_AMBULATORY_CARE_PROVIDER_SITE_OTHER): Payer: Medicare Other | Admitting: General Practice

## 2016-12-01 DIAGNOSIS — Z23 Encounter for immunization: Secondary | ICD-10-CM | POA: Diagnosis not present

## 2016-12-09 DIAGNOSIS — N401 Enlarged prostate with lower urinary tract symptoms: Secondary | ICD-10-CM | POA: Diagnosis not present

## 2016-12-09 DIAGNOSIS — R351 Nocturia: Secondary | ICD-10-CM | POA: Diagnosis not present

## 2016-12-09 DIAGNOSIS — R972 Elevated prostate specific antigen [PSA]: Secondary | ICD-10-CM | POA: Diagnosis not present

## 2017-01-19 ENCOUNTER — Other Ambulatory Visit: Payer: Self-pay | Admitting: Internal Medicine

## 2017-01-26 ENCOUNTER — Ambulatory Visit (INDEPENDENT_AMBULATORY_CARE_PROVIDER_SITE_OTHER): Payer: Medicare Other | Admitting: Podiatry

## 2017-01-26 ENCOUNTER — Encounter: Payer: Self-pay | Admitting: Podiatry

## 2017-01-26 DIAGNOSIS — E1142 Type 2 diabetes mellitus with diabetic polyneuropathy: Secondary | ICD-10-CM

## 2017-01-26 DIAGNOSIS — M79672 Pain in left foot: Secondary | ICD-10-CM

## 2017-01-26 DIAGNOSIS — M79671 Pain in right foot: Secondary | ICD-10-CM

## 2017-01-26 DIAGNOSIS — B351 Tinea unguium: Secondary | ICD-10-CM | POA: Diagnosis not present

## 2017-01-26 NOTE — Progress Notes (Signed)
Subjective: 82 y.o. year old male patient presents complaining of painful nails and pain under the ball of right foot. Patient requests toe nails, corns and calluses trimmed. Having problem with burning toes. Blood glucose reading was 100 this morning.  Objective: Dermatologic: Thick yellow deformed nails x 10. Painful plantar callus sub 5 right foot. Vascular: Pedal pulses are all palpable. Orthopedic: Contracted lesser digits  Neurologic: Subjective burning feet.  Assessment: Dystrophic mycotic nails x 10. Painful callus sub 5 right. Diabetic neuropathy.  Treatment: All mycotic nails and calluses debrided.  Advised to try Compounding cream for burning feet. Patient will wait on this. Return in 3 months or as needed.

## 2017-01-26 NOTE — Patient Instructions (Signed)
Seen for hypertrophic nails and painful calluses. All nails and calluses debrided. Tube foam gauze dispensed for the 3rd toe left. May benefit from Compounding cream if neuropathy pain gets worse. Return in 3 months or as needed.

## 2017-03-16 ENCOUNTER — Encounter: Payer: Self-pay | Admitting: Internal Medicine

## 2017-04-09 ENCOUNTER — Other Ambulatory Visit (INDEPENDENT_AMBULATORY_CARE_PROVIDER_SITE_OTHER): Payer: Medicare Other

## 2017-04-09 ENCOUNTER — Ambulatory Visit (INDEPENDENT_AMBULATORY_CARE_PROVIDER_SITE_OTHER): Payer: Medicare Other | Admitting: Internal Medicine

## 2017-04-09 ENCOUNTER — Other Ambulatory Visit: Payer: Self-pay | Admitting: Internal Medicine

## 2017-04-09 ENCOUNTER — Ambulatory Visit: Payer: Medicare Other | Admitting: Internal Medicine

## 2017-04-09 ENCOUNTER — Encounter: Payer: Self-pay | Admitting: Internal Medicine

## 2017-04-09 ENCOUNTER — Telehealth: Payer: Self-pay

## 2017-04-09 VITALS — BP 126/84 | HR 71 | Temp 97.8°F | Ht 72.25 in | Wt 178.0 lb

## 2017-04-09 DIAGNOSIS — N183 Chronic kidney disease, stage 3 unspecified: Secondary | ICD-10-CM

## 2017-04-09 DIAGNOSIS — E1140 Type 2 diabetes mellitus with diabetic neuropathy, unspecified: Secondary | ICD-10-CM

## 2017-04-09 DIAGNOSIS — I10 Essential (primary) hypertension: Secondary | ICD-10-CM

## 2017-04-09 DIAGNOSIS — E785 Hyperlipidemia, unspecified: Secondary | ICD-10-CM

## 2017-04-09 DIAGNOSIS — R972 Elevated prostate specific antigen [PSA]: Secondary | ICD-10-CM

## 2017-04-09 LAB — LIPID PANEL
Cholesterol: 122 mg/dL (ref 0–200)
HDL: 68.4 mg/dL (ref >39.00)
LDL Cholesterol: 42 mg/dL (ref 0–99)
NonHDL: 54.08
Total CHOL/HDL Ratio: 2
Triglycerides: 60 mg/dL (ref 0.0–149.0)
VLDL: 12 mg/dL (ref 0.0–40.0)

## 2017-04-09 LAB — CBC WITH DIFFERENTIAL/PLATELET
Basophils Absolute: 0 10*3/uL (ref 0.0–0.1)
Basophils Relative: 0.7 % (ref 0.0–3.0)
Eosinophils Absolute: 0.1 10*3/uL (ref 0.0–0.7)
Eosinophils Relative: 2.4 % (ref 0.0–5.0)
HCT: 40.6 % (ref 39.0–52.0)
Hemoglobin: 13.3 g/dL (ref 13.0–17.0)
Lymphocytes Relative: 26.9 % (ref 12.0–46.0)
Lymphs Abs: 1.1 10*3/uL (ref 0.7–4.0)
MCHC: 32.6 g/dL (ref 30.0–36.0)
MCV: 87.9 fl (ref 78.0–100.0)
Monocytes Absolute: 0.3 10*3/uL (ref 0.1–1.0)
Monocytes Relative: 7.8 % (ref 3.0–12.0)
Neutro Abs: 2.6 10*3/uL (ref 1.4–7.7)
Neutrophils Relative %: 62.2 % (ref 43.0–77.0)
Platelets: 203 10*3/uL (ref 150.0–400.0)
RBC: 4.62 Mil/uL (ref 4.22–5.81)
RDW: 14.6 % (ref 11.5–15.5)
WBC: 4.2 10*3/uL (ref 4.0–10.5)

## 2017-04-09 LAB — BASIC METABOLIC PANEL
BUN: 32 mg/dL — ABNORMAL HIGH (ref 6–23)
CO2: 27 mEq/L (ref 19–32)
Calcium: 10 mg/dL (ref 8.4–10.5)
Chloride: 106 mEq/L (ref 96–112)
Creatinine, Ser: 1.72 mg/dL — ABNORMAL HIGH (ref 0.40–1.50)
GFR: 49.36 mL/min — ABNORMAL LOW (ref >60.00)
Glucose, Bld: 103 mg/dL — ABNORMAL HIGH (ref 70–99)
Potassium: 4.9 mEq/L (ref 3.5–5.1)
Sodium: 143 mEq/L (ref 135–145)

## 2017-04-09 LAB — URINALYSIS, ROUTINE W REFLEX MICROSCOPIC
Bilirubin Urine: NEGATIVE
Hgb urine dipstick: NEGATIVE
Ketones, ur: NEGATIVE
Leukocytes, UA: NEGATIVE
Nitrite: NEGATIVE
RBC / HPF: NONE SEEN (ref 0–?)
Specific Gravity, Urine: 1.015 (ref 1.000–1.030)
Total Protein, Urine: NEGATIVE
Urine Glucose: NEGATIVE
Urobilinogen, UA: 0.2 (ref 0.0–1.0)
pH: 6 (ref 5.0–8.0)

## 2017-04-09 LAB — HEPATIC FUNCTION PANEL
ALT: 14 U/L (ref 0–53)
AST: 20 U/L (ref 0–37)
Albumin: 4.6 g/dL (ref 3.5–5.2)
Alkaline Phosphatase: 94 U/L (ref 39–117)
Bilirubin, Direct: 0.1 mg/dL (ref 0.0–0.3)
Total Bilirubin: 0.5 mg/dL (ref 0.2–1.2)
Total Protein: 6.9 g/dL (ref 6.0–8.3)

## 2017-04-09 LAB — MICROALBUMIN / CREATININE URINE RATIO
Creatinine,U: 89.9 mg/dL
Microalb Creat Ratio: 0.8 mg/g (ref 0.0–30.0)
Microalb, Ur: <0.7 mg/dL (ref 0.0–1.9)

## 2017-04-09 LAB — TSH: TSH: 3.84 u[IU]/mL (ref 0.35–4.50)

## 2017-04-09 LAB — PSA: PSA: 4.61 ng/mL — ABNORMAL HIGH (ref 0.10–4.00)

## 2017-04-09 LAB — HEMOGLOBIN A1C: Hgb A1c MFr Bld: 6.7 % — ABNORMAL HIGH (ref 4.6–6.5)

## 2017-04-09 MED ORDER — GLIMEPIRIDE 1 MG PO TABS: ORAL_TABLET | ORAL | 3 refills | Status: DC

## 2017-04-09 NOTE — Telephone Encounter (Signed)
-----  Message from [name], MD sent at 04/09/2017  1:01 PM EST ----- Left message on MyChart, pt to cont same tx except  The test results show that your current treatment is OK, except the kidney function appears to be mild worse, and to the point now that we should refer you to Nephrology to further consideration,  You should hear from the office soon. Redmond Baseman to please inform pt, I will do referral

## 2017-04-09 NOTE — Assessment & Plan Note (Signed)
Getting mildlly overcontrolled though no low sugars yet, last a1c was excellent, will recheck today but ok to decrease the glimeparide to 1 mg - 1/2 tab in AM only, cont all other meds, diet.

## 2017-04-09 NOTE — Progress Notes (Signed)
Subjective:    Patient ID: Nicholas Patton, male    DOB: 08/06/1934, 83 y.o.   MRN: [id number]  HPI  Here to f/u; overall doing ok,  Pt denies chest pain, increasing sob or doe, wheezing, orthopnea, PND, increased LE swelling, palpitations, dizziness or syncope.  Pt denies new neurological symptoms such as new headache, or facial or extremity weakness or numbness.  Pt denies polydipsia, polyuria, but has had several low sugar episodes in the past several wks, mild, improved with eating.  Pt states overall good compliance with meds, mostly trying to follow appropriate diet, with wt overall stable,  but little exercise however.  Has iron labs planned for about mar per GI. No overt bleeding.  Seen per urology, exam benign per pt, with plan to f/u at 6 mo.  Also c/o bilat feet cramping mild intermittent, usually at night, sometimes during the day, without obvious overuse such prolonged walking or standing  Denies urinary symptoms such as dysuria, frequency, urgency, flank pain, hematuria or n/v, fever, chills.  Past Medical History:  Diagnosis Date  . Anemia   . Arthritis   . Cataract    REMOVED BILATERAL  . Colon polyps   . DIABETES MELLITUS, TYPE II    takes Metformin and Glimepiride daily  . Diverticulosis   . GERD (gastroesophageal reflux disease)    takes Protonix daily  . History of colon polyps   . HYPERLIPIDEMIA    takes Atorvastatin daily  . HYPERTENSION    takes Amlodipine and Hyzaar daily  . Ileus following gastrointestinal surgery 07/22/2012  . Joint pain   . Peripheral neuropathy   . Peripheral neuropathy   . PERIPHERAL NEUROPATHY, FEET 10/06/2007  . VITAMIN B12 DEFICIENCY 09/06/2008  . VITAMIN D DEFICIENCY 01/16/2010   takes Vitamin D daily   Past Surgical History:  Procedure Laterality Date  . CATARACT EXTRACTION     both eyes  . CHOLECYSTECTOMY N/A 07/18/2012   Procedure: LAPAROSCOPIC CHOLECYSTECTOMY WITH INTRAOPERATIVE CHOLANGIOGRAM;  Surgeon: Zenovia Jarred, MD;   Location: [facility];  Service: General;  Laterality: N/A;  . COLONOSCOPY    . POLYPECTOMY    . POSTERIOR CERVICAL FUSION/FORAMINOTOMY N/A 06/27/2015   Procedure: Posterior Cervical Fusion with lateral mass fixation Cervical four to cervical seven;  Surgeon: Eustace Moore, MD;  Location: [facility];  Service: Neurosurgery;  Laterality: N/A;  . TOTAL KNEE ARTHROPLASTY  2001   Left  . TURP VAPORIZATION      reports that he has quit smoking. His smoking use included cigarettes. He smoked 0.00 packs per day. he has never used smokeless tobacco. He reports that he does not drink alcohol or use drugs. family history includes Diabetes Mellitus II in his mother; Lung cancer in his brother. No Known Allergies Current Outpatient Medications on File Prior to Visit  Medication Sig Dispense Refill  . amLODipine (NORVASC) 5 MG tablet TAKE 1 TABLET BY MOUTH  DAILY 90 tablet 0  . aspirin 81 MG EC tablet Take 81 mg by mouth daily.      Marland Kitchen atorvastatin (LIPITOR) 40 MG tablet TAKE 1 TABLET BY MOUTH  DAILY 90 tablet 0  . Cholecalciferol (VITAMIN D3) 1000 UNITS CAPS Take 1 capsule (1,000 Units total) by mouth daily. 90 capsule 3  . ferrous sulfate 325 (65 FE) MG tablet Take 325 mg by mouth daily with breakfast.    . losartan-hydrochlorothiazide (HYZAAR) 100-25 MG tablet TAKE 1 TABLET BY MOUTH  DAILY 90 tablet 0  . metFORMIN (  GLUCOPHAGE) 500 MG tablet TAKE 2 TABLETS BY MOUTH  TWICE A DAY WITH MEALS 360 tablet 0  . Multiple Vitamins-Minerals (MULTIVITAMIN PO) Take 1 tablet by mouth daily.    . pantoprazole (PROTONIX) 40 MG tablet TAKE 1 TABLET BY MOUTH  DAILY 90 tablet 0  . tamsulosin (FLOMAX) 0.4 MG CAPS capsule TAKE 1 CAPSULE BY MOUTH  DAILY 90 capsule 0   No current facility-administered medications on file prior to visit.    Review of Systems  Constitutional: Negative for other unusual diaphoresis or sweats HENT: Negative for ear discharge or swelling Eyes: Negative for other worsening visual  disturbances Respiratory: Negative for stridor or other swelling  Gastrointestinal: Negative for worsening distension or other blood Genitourinary: Negative for retention or other urinary change Musculoskeletal: Negative for other MSK pain or swelling Skin: Negative for color change or other new lesions Neurological: Negative for worsening tremors and other numbness  Psychiatric/Behavioral: Negative for worsening agitation or other fatigue All other system neg per pt    Objective:   Physical Exam BP 126/84   Pulse 71   Temp 97.8 F (36.6 C) (Oral)   Ht 6' 0.25" (1.835 m)   Wt 178 lb (80.7 kg)   SpO2 97%   BMI 23.97 kg/m  VS noted,  Constitutional: Pt appears in NAD HENT: Head: NCAT.  Right Ear: External ear normal.  Left Ear: External ear normal.  Eyes: . Pupils are equal, round, and reactive to light. Conjunctivae and EOM are normal Nose: without d/c or deformity Neck: Neck supple. Gross normal ROM Cardiovascular: Normal rate and regular rhythm.   Pulmonary/Chest: Effort normal and breath sounds without rales or wheezing.  Abd:  Soft, NT, ND, + BS, no organomegaly Neurological: Pt is alert. At baseline orientation, motor grossly intact Skin: Skin is warm. No rashes, other new lesions, no LE edema Psychiatric: Pt behavior is normal without agitation  No other exam findings    Assessment & Plan:

## 2017-04-09 NOTE — Telephone Encounter (Signed)
Called pt, LVM  Created CRM

## 2017-04-09 NOTE — Patient Instructions (Addendum)
OK to decrease the Glimeparide to 1/2 pill in the AM only  Please continue all other medications as before  Please have the pharmacy call with any other refills you may need.  Please continue your efforts at being more active, low cholesterol diet, and weight control.  You are otherwise up to date with prevention measures today.  Please keep your appointments with your specialists as you may have planned  Please go to the LAB in the Basement (turn left off the elevator) for the tests to be done today  You will be contacted by phone if any changes need to be made immediately.  Otherwise, you will receive a letter about your results with an explanation, but please check with MyChart first.  Please remember to sign up for MyChart if you have not done so, as this will be important to you in the future with finding out test results, communicating by private email, and scheduling acute appointments online when needed.  Please return in 6 months, or sooner if needed

## 2017-04-11 NOTE — Assessment & Plan Note (Signed)
Lab Results  Component Value Date   LDLCALC 42 04/09/2017  stable overall by history and exam, recent data reviewed with pt, and pt to continue medical treatment as before,  to f/u any worsening symptoms or concerns

## 2017-04-11 NOTE — Assessment & Plan Note (Signed)
stable overall by history and exam, recent data reviewed with pt, and pt to continue medical treatment as before,  to f/u any worsening symptoms or concerns

## 2017-04-11 NOTE — Assessment & Plan Note (Signed)
Also for f/u psa today, asympt,  to f/u any worsening symptoms or concerns

## 2017-04-11 NOTE — Assessment & Plan Note (Signed)
stable overall by history and exam, recent data reviewed with pt, and pt to continue medical treatment as before,  to f/u any worsening symptoms or concerns BP Readings from Last 3 Encounters:  04/09/17 126/84  10/07/16 126/70  05/18/16 110/62

## 2017-04-20 ENCOUNTER — Encounter: Payer: Self-pay | Admitting: Internal Medicine

## 2017-04-20 ENCOUNTER — Ambulatory Visit (INDEPENDENT_AMBULATORY_CARE_PROVIDER_SITE_OTHER): Payer: Medicare Other | Admitting: Internal Medicine

## 2017-04-20 DIAGNOSIS — J069 Acute upper respiratory infection, unspecified: Secondary | ICD-10-CM

## 2017-04-20 MED ORDER — BENZONATATE 100 MG PO CAPS: 100.0000 mg | ORAL_CAPSULE | Freq: Three times a day (TID) | ORAL | 0 refills | Status: DC | PRN

## 2017-04-20 NOTE — Progress Notes (Signed)
   Subjective:    Patient ID: Nicholas Patton, male    DOB: 21-Nov-1934, 83 y.o.   MRN: [id number]  HPI The patient is an 83 YO man coming in for sore throat and running nose. Started about 3 days ago. Overall improving gradually. Not tried anything for it. Cough is mild and non-productive. Denies fevers or chills. Denies SOB. Denies known sick contacts. Has tried mucinex but this did not work. Denies muscle aches.   Review of Systems  Constitutional: Negative for activity change, appetite change, chills, fatigue, fever and unexpected weight change.  HENT: Positive for congestion, postnasal drip, rhinorrhea and sinus pressure. Negative for ear discharge, ear pain, sinus pain, sneezing, sore throat, tinnitus, trouble swallowing and voice change.   Eyes: Negative.   Respiratory: Positive for cough. Negative for chest tightness, shortness of breath and wheezing.   Cardiovascular: Negative.   Gastrointestinal: Negative.   Musculoskeletal: Negative for myalgias.  Neurological: Negative.       Objective:   Physical Exam  Constitutional: He is oriented to person, place, and time. He appears well-developed and well-nourished.  HENT:  Head: Normocephalic and atraumatic.  Oropharynx with redness and clear drainage, nose with swollen turbinates, TMs normal bilaterally  Eyes: EOM are normal.  Neck: Normal range of motion. No thyromegaly present.  Cardiovascular: Normal rate and regular rhythm.  Pulmonary/Chest: Effort normal and breath sounds normal. No respiratory distress. He has no wheezes. He has no rales.  Abdominal: Soft.  Musculoskeletal: He exhibits tenderness.  Lymphadenopathy:    He has no cervical adenopathy.  Neurological: He is alert and oriented to person, place, and time.  Skin: Skin is warm and dry.   Vitals:   04/20/17 1449  BP: 140/70  Pulse: 100  Temp: 98.5 F (36.9 C)  TempSrc: Oral  SpO2: 96%  Weight: 176 lb (79.8 kg)  Height: 6' 0.25" (1.835 m)      Assessment &  Plan:

## 2017-04-20 NOTE — Patient Instructions (Signed)
We have sent in a cough medicine called tessalon perles to use up to 3 times a day for cough.  Start taking zyrtec (cetirizine) for the drainage 1 pill daily to help you feel better quicker.

## 2017-04-20 NOTE — Assessment & Plan Note (Signed)
Rx for tessalon perles for cough. No indication for antibiotics or steroids.

## 2017-04-23 DIAGNOSIS — H40013 Open angle with borderline findings, low risk, bilateral: Secondary | ICD-10-CM | POA: Diagnosis not present

## 2017-04-23 DIAGNOSIS — H04123 Dry eye syndrome of bilateral lacrimal glands: Secondary | ICD-10-CM | POA: Diagnosis not present

## 2017-04-23 DIAGNOSIS — E119 Type 2 diabetes mellitus without complications: Secondary | ICD-10-CM | POA: Diagnosis not present

## 2017-04-23 DIAGNOSIS — Z961 Presence of intraocular lens: Secondary | ICD-10-CM | POA: Diagnosis not present

## 2017-04-23 DIAGNOSIS — H4322 Crystalline deposits in vitreous body, left eye: Secondary | ICD-10-CM | POA: Diagnosis not present

## 2017-04-23 LAB — HM DIABETES EYE EXAM

## 2017-04-28 ENCOUNTER — Encounter: Payer: Self-pay | Admitting: Podiatry

## 2017-04-28 ENCOUNTER — Ambulatory Visit (INDEPENDENT_AMBULATORY_CARE_PROVIDER_SITE_OTHER): Payer: Medicare Other | Admitting: Podiatry

## 2017-04-28 DIAGNOSIS — B351 Tinea unguium: Secondary | ICD-10-CM | POA: Diagnosis not present

## 2017-04-28 DIAGNOSIS — E1142 Type 2 diabetes mellitus with diabetic polyneuropathy: Secondary | ICD-10-CM

## 2017-04-28 NOTE — Patient Instructions (Signed)
Seen for hypertrophic nails. All nails debrided. Return in 3 months or as needed.  

## 2017-04-28 NOTE — Progress Notes (Signed)
Subjective: 83 y.o. year old male patient presents complaining of painful nails. Patient requests toe nails and calluses trimmed.   Objective: Dermatologic: Thick yellow deformed nails x 10. Interdigital corn painful in between the first and 2nd toe left foot. Plantar medial hallucal callus bilateral. Vascular: Pedal pulses are all palpable. Orthopedic: Contracted lesser digits bilateral. HAV with bunion bilateral. Neurologic: Diagnosed with Diabetic neuropathy.  Assessment: Dystrophic mycotic nails x 10. Plantar calluses. Painful interdigital corn 1st and 2nd left. Severe HAV with bunion bilateral. Pain in both feet. Diabetic neuropathy.  Treatment: All mycotic nails, corns, calluses debrided.  Return in 3 months or as needed.

## 2017-05-01 IMAGING — RF DG C-ARM 61-120 MIN
1 series · 1 of 1 positions shown · non-contrast
Comparison: 06/04/2015 cervical spine radiographs

CLINICAL DATA: Posterior cervical fusion C4-C7.

EXAM:
DG C-ARM 61-120 MIN; DG CERVICAL SPINE - 1 VIEW

[Series 1: run · 1 of 1 slices shown]
[im 1/1]
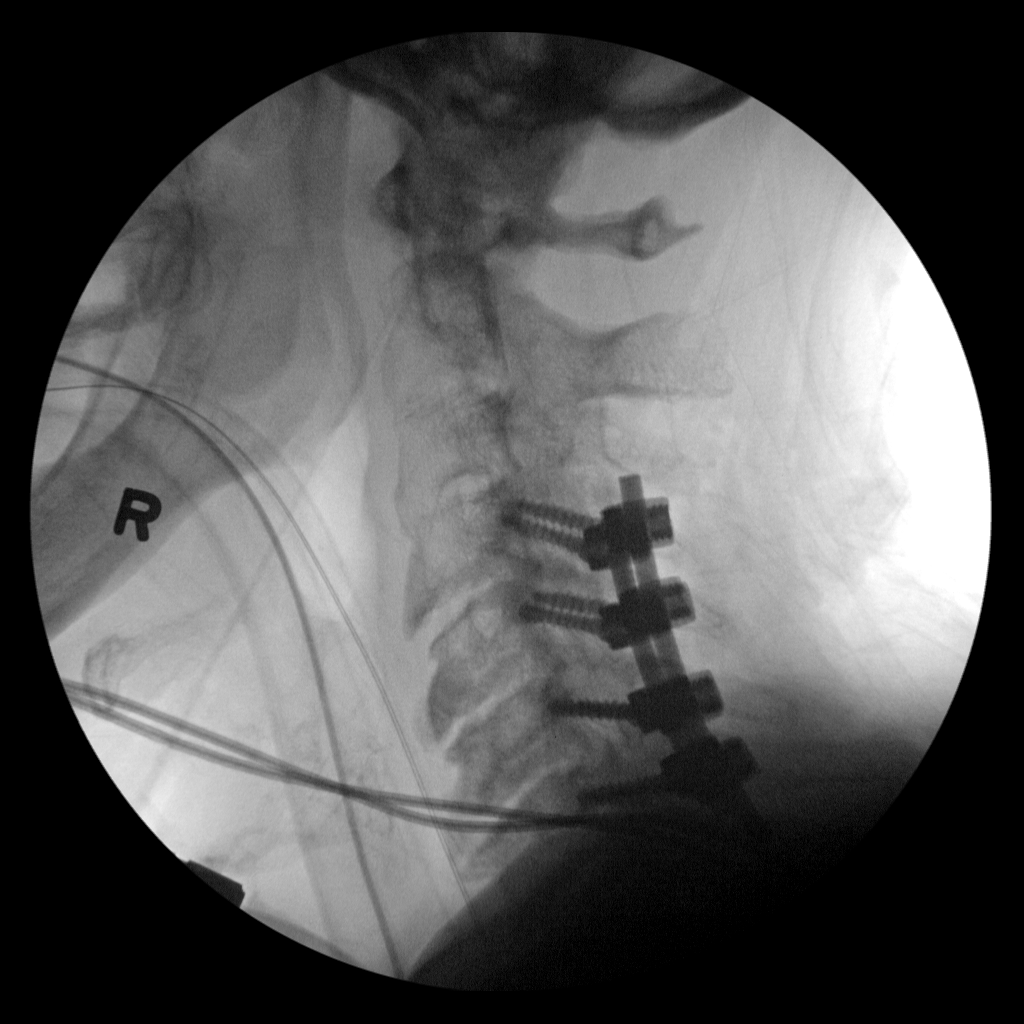

[1 of 1 positions shown; findings below may reference images not displayed]

FINDINGS: Fluoroscopy time 0 minutes 13 seconds. Single nondiagnostic spot
fluoroscopic intraoperative radiograph demonstrates postsurgical
changes from posterior cervical spine fusion from C4-C7.
IMPRESSION: Intraoperative fluoroscopic guidance for posterior cervical spine
fusion C4-C7.

## 2017-05-03 ENCOUNTER — Encounter: Payer: Self-pay | Admitting: Internal Medicine

## 2017-05-03 DIAGNOSIS — R040 Epistaxis: Secondary | ICD-10-CM | POA: Diagnosis not present

## 2017-05-04 ENCOUNTER — Encounter: Payer: Self-pay | Admitting: Internal Medicine

## 2017-05-04 DIAGNOSIS — R040 Epistaxis: Secondary | ICD-10-CM | POA: Diagnosis not present

## 2017-05-18 ENCOUNTER — Other Ambulatory Visit (INDEPENDENT_AMBULATORY_CARE_PROVIDER_SITE_OTHER): Payer: Medicare Other

## 2017-05-18 DIAGNOSIS — D509 Iron deficiency anemia, unspecified: Secondary | ICD-10-CM

## 2017-05-18 LAB — CBC WITH DIFFERENTIAL/PLATELET
Basophils Absolute: 0 10*3/uL (ref 0.0–0.1)
Basophils Relative: 0.7 % (ref 0.0–3.0)
Eosinophils Absolute: 0.1 10*3/uL (ref 0.0–0.7)
Eosinophils Relative: 2.7 % (ref 0.0–5.0)
HCT: 39.5 % (ref 39.0–52.0)
Hemoglobin: 13.1 g/dL (ref 13.0–17.0)
Lymphocytes Relative: 40.9 % (ref 12.0–46.0)
Lymphs Abs: 2.1 10*3/uL (ref 0.7–4.0)
MCHC: 33.3 g/dL (ref 30.0–36.0)
MCV: 87.3 fl (ref 78.0–100.0)
Monocytes Absolute: 0.4 10*3/uL (ref 0.1–1.0)
Monocytes Relative: 8.2 % (ref 3.0–12.0)
Neutro Abs: 2.5 10*3/uL (ref 1.4–7.7)
Neutrophils Relative %: 47.5 % (ref 43.0–77.0)
Platelets: 195 10*3/uL (ref 150.0–400.0)
RBC: 4.52 Mil/uL (ref 4.22–5.81)
RDW: 14.9 % (ref 11.5–15.5)
WBC: 5.2 10*3/uL (ref 4.0–10.5)

## 2017-05-18 LAB — IBC PANEL
Iron: 46 ug/dL (ref 42–165)
Saturation Ratios: 11.8 % — ABNORMAL LOW (ref 20.0–50.0)
Transferrin: 279 mg/dL (ref 212.0–360.0)

## 2017-05-18 LAB — FERRITIN: Ferritin: 74.4 ng/mL (ref 22.0–322.0)

## 2017-05-31 DIAGNOSIS — R972 Elevated prostate specific antigen [PSA]: Secondary | ICD-10-CM | POA: Diagnosis not present

## 2017-06-07 ENCOUNTER — Other Ambulatory Visit: Payer: Self-pay | Admitting: Internal Medicine

## 2017-06-09 DIAGNOSIS — N401 Enlarged prostate with lower urinary tract symptoms: Secondary | ICD-10-CM | POA: Diagnosis not present

## 2017-06-09 DIAGNOSIS — R351 Nocturia: Secondary | ICD-10-CM | POA: Diagnosis not present

## 2017-06-09 DIAGNOSIS — R972 Elevated prostate specific antigen [PSA]: Secondary | ICD-10-CM | POA: Diagnosis not present

## 2017-06-18 ENCOUNTER — Other Ambulatory Visit: Payer: Self-pay | Admitting: Internal Medicine

## 2017-06-18 ENCOUNTER — Other Ambulatory Visit (INDEPENDENT_AMBULATORY_CARE_PROVIDER_SITE_OTHER): Payer: Medicare Other

## 2017-06-18 ENCOUNTER — Ambulatory Visit (INDEPENDENT_AMBULATORY_CARE_PROVIDER_SITE_OTHER)
Admission: RE | Admit: 2017-06-18 | Discharge: 2017-06-18 | Disposition: A | Discharge status: Home / Self Care | Payer: Medicare Other | Source: Ambulatory Visit | Attending: Internal Medicine | Admitting: Internal Medicine

## 2017-06-18 ENCOUNTER — Ambulatory Visit (INDEPENDENT_AMBULATORY_CARE_PROVIDER_SITE_OTHER): Payer: Medicare Other | Admitting: Internal Medicine

## 2017-06-18 ENCOUNTER — Encounter: Payer: Self-pay | Admitting: Internal Medicine

## 2017-06-18 VITALS — BP 124/78 | HR 64 | Temp 98.0°F | Ht 72.25 in | Wt 178.0 lb

## 2017-06-18 DIAGNOSIS — M545 Low back pain, unspecified: Secondary | ICD-10-CM

## 2017-06-18 DIAGNOSIS — E1140 Type 2 diabetes mellitus with diabetic neuropathy, unspecified: Secondary | ICD-10-CM | POA: Diagnosis not present

## 2017-06-18 DIAGNOSIS — N183 Chronic kidney disease, stage 3 unspecified: Secondary | ICD-10-CM

## 2017-06-18 DIAGNOSIS — M546 Pain in thoracic spine: Secondary | ICD-10-CM | POA: Diagnosis not present

## 2017-06-18 DIAGNOSIS — S299XXA Unspecified injury of thorax, initial encounter: Secondary | ICD-10-CM | POA: Diagnosis not present

## 2017-06-18 DIAGNOSIS — D509 Iron deficiency anemia, unspecified: Secondary | ICD-10-CM | POA: Diagnosis not present

## 2017-06-18 LAB — URINALYSIS, ROUTINE W REFLEX MICROSCOPIC
Bilirubin Urine: NEGATIVE
Hgb urine dipstick: NEGATIVE
Ketones, ur: NEGATIVE
Leukocytes, UA: NEGATIVE
Nitrite: NEGATIVE
RBC / HPF: NONE SEEN (ref 0–?)
Specific Gravity, Urine: 1.01 (ref 1.000–1.030)
Total Protein, Urine: NEGATIVE
Urine Glucose: NEGATIVE
Urobilinogen, UA: 0.2 (ref 0.0–1.0)
WBC, UA: NONE SEEN (ref 0–?)
pH: 6 (ref 5.0–8.0)

## 2017-06-18 LAB — BASIC METABOLIC PANEL
BUN: 31 mg/dL — ABNORMAL HIGH (ref 6–23)
CO2: 26 mEq/L (ref 19–32)
Calcium: 9.9 mg/dL (ref 8.4–10.5)
Chloride: 106 mEq/L (ref 96–112)
Creatinine, Ser: 1.69 mg/dL — ABNORMAL HIGH (ref 0.40–1.50)
GFR: 50.35 mL/min — ABNORMAL LOW (ref >60.00)
Glucose, Bld: 103 mg/dL — ABNORMAL HIGH (ref 70–99)
Potassium: 4.7 mEq/L (ref 3.5–5.1)
Sodium: 143 mEq/L (ref 135–145)

## 2017-06-18 MED ORDER — TRAMADOL HCL 50 MG PO TABS: 50.0000 mg | ORAL_TABLET | Freq: Four times a day (QID) | ORAL | 0 refills | Status: AC | PRN

## 2017-06-18 MED ORDER — TIZANIDINE HCL 2 MG PO TABS: 2.0000 mg | ORAL_TABLET | Freq: Four times a day (QID) | ORAL | 1 refills | Status: DC | PRN

## 2017-06-18 NOTE — Patient Instructions (Addendum)
Please take all new medication as prescribed - the pain medication, and muscle relaxer as needed  Please continue all other medications as before, and refills have been done if requested.  Please have the pharmacy call with any other refills you may need.  Please keep your appointments with your specialists as you may have planned  You will be contacted regarding the referral for: MRI for the lower back  I will ask the [facility] in the office to inquire about your Nephrology referral  Please go to the [facility] Department in the Basement (go straight as you get off the elevator) for the x-ray testing  Please go to the LAB in the Basement (turn left off the elevator) for the tests to be done today  You will be contacted by phone if any changes need to be made immediately.  Otherwise, you will receive a letter about your results with an explanation, but please check with MyChart first.  Please remember to sign up for MyChart if you have not done so, as this will be important to you in the future with finding out test results, communicating by private email, and scheduling acute appointments online when needed.

## 2017-06-18 NOTE — Progress Notes (Signed)
Subjective:    Patient ID: Nicholas Patton, male    DOB: 10-22-1934, 83 y.o.   MRN: [id number]  HPI  Here to f/u with c/o lower back pain, actually beginning onset about 2017, mild intermmitent for several years, but now more severe (at least moderate most of the time), more frequent, seems to radaite to the lower thoracic areas right more than left without bowel or bladder change, fever, wt loss,  worsening LE pain/numbness/weakness, gait change or falls, except he incidentally had a trip and fall doing yard work 2 days ago, but states had pain prior and really not worse afterwards.   Pt denies fever, wt loss, night sweats, loss of appetite, or other constitutional symptoms  Did go bowling with fair game yesterday, and not really making it worse.  Nothing else makes better or worse.  He is concerned b/c he has CKD (not yet seen per nephrology after referral from me feb 1). Denies urinary symptoms such as dysuria, frequency, urgency, flank pain, hematuria or n/v, fever, chills.  Also has not heard about referral to nephrology yet.  Denies urinary symptoms such as dysuria, frequency, urgency, flank pain, hematuria or n/v, fever, chills.   Pt denies polydipsia, polyuria Past Medical History:  Diagnosis Date  . Anemia   . Arthritis   . Cataract    REMOVED BILATERAL  . Colon polyps   . DIABETES MELLITUS, TYPE II    takes Metformin and Glimepiride daily  . Diverticulosis   . GERD (gastroesophageal reflux disease)    takes Protonix daily  . History of colon polyps   . HYPERLIPIDEMIA    takes Atorvastatin daily  . HYPERTENSION    takes Amlodipine and Hyzaar daily  . Ileus following gastrointestinal surgery ([place]) 07/22/2012  . Joint pain   . Peripheral neuropathy   . Peripheral neuropathy   . PERIPHERAL NEUROPATHY, FEET 10/06/2007  . VITAMIN B12 DEFICIENCY 09/06/2008  . VITAMIN D DEFICIENCY 01/16/2010   takes Vitamin D daily   Past Surgical History:  Procedure Laterality Date  . CATARACT  EXTRACTION     both eyes  . CHOLECYSTECTOMY N/A 07/18/2012   Procedure: LAPAROSCOPIC CHOLECYSTECTOMY WITH INTRAOPERATIVE CHOLANGIOGRAM;  Surgeon: Zenovia Jarred, MD;  Location: [facility];  Service: General;  Laterality: N/A;  . COLONOSCOPY    . POLYPECTOMY    . POSTERIOR CERVICAL FUSION/FORAMINOTOMY N/A 06/27/2015   Procedure: Posterior Cervical Fusion with lateral mass fixation Cervical four to cervical seven;  Surgeon: Eustace Moore, MD;  Location: [facility];  Service: Neurosurgery;  Laterality: N/A;  . TOTAL KNEE ARTHROPLASTY  2001   Left  . TURP VAPORIZATION      reports that he has quit smoking. His smoking use included cigarettes. He smoked 0.00 packs per day. He has never used smokeless tobacco. He reports that he does not drink alcohol or use drugs. family history includes Diabetes Mellitus II in his mother; Lung cancer in his brother. No Known Allergies Current Outpatient Medications on File Prior to Visit  Medication Sig Dispense Refill  . amLODipine (NORVASC) 5 MG tablet TAKE 1 TABLET BY MOUTH  DAILY 90 tablet 1  . aspirin 81 MG EC tablet Take 81 mg by mouth daily.      Marland Kitchen atorvastatin (LIPITOR) 40 MG tablet TAKE 1 TABLET BY MOUTH  DAILY 90 tablet 1  . benzonatate (TESSALON PERLES) 100 MG capsule Take 1 capsule (100 mg total) by mouth 3 (three) times daily as needed for cough. 60 capsule  0  . Cholecalciferol (VITAMIN D3) 1000 UNITS CAPS Take 1 capsule (1,000 Units total) by mouth daily. 90 capsule 3  . ferrous sulfate 325 (65 FE) MG tablet Take 325 mg by mouth daily with breakfast.    . glimepiride (AMARYL) 1 MG tablet TAKE ONE-HALF TABLET BY  MOUTH once daily 45 tablet 3  . losartan-hydrochlorothiazide (HYZAAR) 100-25 MG tablet TAKE 1 TABLET BY MOUTH  DAILY 90 tablet 1  . metFORMIN (GLUCOPHAGE) 500 MG tablet TAKE 2 TABLETS BY MOUTH  TWICE A DAY WITH MEALS 360 tablet 1  . Multiple Vitamins-Minerals (MULTIVITAMIN PO) Take 1 tablet by mouth daily.    . pantoprazole (PROTONIX) 40 MG  tablet TAKE 1 TABLET BY MOUTH  DAILY 90 tablet 1  . tamsulosin (FLOMAX) 0.4 MG CAPS capsule TAKE 1 CAPSULE BY MOUTH  DAILY 90 capsule 1   No current facility-administered medications on file prior to visit.    Review of Systems  Constitutional: Negative for other unusual diaphoresis or sweats HENT: Negative for ear discharge or swelling Eyes: Negative for other worsening visual disturbances Respiratory: Negative for stridor or other swelling  Gastrointestinal: Negative for worsening distension or other blood Genitourinary: Negative for retention or other urinary change Musculoskeletal: Negative for other MSK pain or swelling Skin: Negative for color change or other new lesions Neurological: Negative for worsening tremors and other numbness  Psychiatric/Behavioral: Negative for worsening agitation or other fatigue All other system neg per pt    Objective:   Physical Exam BP 124/78   Pulse 64   Temp 98 F (36.7 C) (Oral)   Ht 6' 0.25" (1.835 m)   Wt 178 lb (80.7 kg)   SpO2 96%   BMI 23.97 kg/m  VS noted,  Constitutional: Pt appears in NAD HENT: Head: NCAT.  Right Ear: External ear normal.  Left Ear: External ear normal.  Eyes: . Pupils are equal, round, and reactive to light. Conjunctivae and EOM are normal Nose: without d/c or deformity Neck: Neck supple. Gross normal ROM Cardiovascular: Normal rate and regular rhythm.   Pulmonary/Chest: Effort normal and breath sounds without rales or wheezing.  Abd:  Soft, NT, ND, + BS, no organomegaly, has tenderness to bilat lumbar and lower thoracic paravertebral spasm Neurological: Pt is alert. At baseline orientation, motor grossly intact Skin: Skin is warm. No rashes, other new lesions, no LE edema Psychiatric: Pt behavior is normal without agitation  No other exam findings       Assessment & Plan:

## 2017-06-19 NOTE — Assessment & Plan Note (Signed)
stable overall by history and exam, recent data reviewed with pt, and pt to continue medical treatment as before,  to f/u any worsening symptoms or concerns Lab Results  Component Value Date   HGBA1C 6.7 (H) 04/09/2017

## 2017-06-19 NOTE — Assessment & Plan Note (Signed)
Also for f/u lab today

## 2017-06-19 NOTE — Assessment & Plan Note (Signed)
C/w msk strain vs underlying lumbar DJD/DDD, has not responded to conservative management for > 1 yr, ok for MRI to further evaluate as had no prior imaging

## 2017-06-19 NOTE — Assessment & Plan Note (Signed)
For f/u lab today, will check on referral to renal

## 2017-06-28 ENCOUNTER — Telehealth: Payer: Self-pay | Admitting: Internal Medicine

## 2017-06-28 NOTE — Telephone Encounter (Signed)
Called pharmacy to verify Tramadol prescription.

## 2017-06-28 NOTE — Telephone Encounter (Signed)
Copied from [place] [id number]. Topic: Quick Communication - Rx Refill/Question >> Jun 28, 2017  9:49 AM Neva Seat wrote: Tramadol 50 mg  [facility] pharmacy is needing to know what pt is taking the Rx for - days  [place], [place] - [address] [place] [address] Phone: [phone] Fax: [phone]

## 2017-07-09 ENCOUNTER — Other Ambulatory Visit: Payer: Self-pay | Admitting: Internal Medicine

## 2017-07-09 ENCOUNTER — Ambulatory Visit
Admission: RE | Admit: 2017-07-09 | Discharge: 2017-07-09 | Disposition: A | Discharge status: Home / Self Care | Payer: Medicare Other | Source: Ambulatory Visit | Attending: Internal Medicine | Admitting: Internal Medicine

## 2017-07-09 DIAGNOSIS — M5416 Radiculopathy, lumbar region: Secondary | ICD-10-CM

## 2017-07-09 DIAGNOSIS — M545 Low back pain, unspecified: Secondary | ICD-10-CM

## 2017-07-09 DIAGNOSIS — M48061 Spinal stenosis, lumbar region without neurogenic claudication: Secondary | ICD-10-CM

## 2017-07-12 ENCOUNTER — Telehealth: Payer: Self-pay

## 2017-07-12 NOTE — Telephone Encounter (Signed)
-----  Message from [name], MD sent at 07/09/2017  6:53 PM EDT ----- Left message on MyChart, pt to cont same tx except  The test results show that your current treatment is OK, except there is evidence for spinal stenosis and also a possible pinch on the right lower spine.  We should refer you to Neurosurgury for further evaluation.  You should hear from the office as well    Shirron to please inform pt, I will do referral

## 2017-07-12 NOTE — Telephone Encounter (Signed)
Pt has viewed results via MyChart  

## 2017-07-23 ENCOUNTER — Encounter: Payer: Self-pay | Admitting: Internal Medicine

## 2017-07-23 DIAGNOSIS — I129 Hypertensive chronic kidney disease with stage 1 through stage 4 chronic kidney disease, or unspecified chronic kidney disease: Secondary | ICD-10-CM | POA: Diagnosis not present

## 2017-07-23 DIAGNOSIS — N183 Chronic kidney disease, stage 3 (moderate): Secondary | ICD-10-CM | POA: Diagnosis not present

## 2017-07-24 MED ORDER — LINAGLIPTIN 5 MG PO TABS: 5.0000 mg | ORAL_TABLET | Freq: Every day | ORAL | 3 refills | Status: DC

## 2017-07-24 MED ORDER — GLIMEPIRIDE 1 MG PO TABS: ORAL_TABLET | ORAL | 3 refills | Status: DC

## 2017-07-27 ENCOUNTER — Encounter: Payer: Self-pay | Admitting: Podiatry

## 2017-07-27 ENCOUNTER — Ambulatory Visit (INDEPENDENT_AMBULATORY_CARE_PROVIDER_SITE_OTHER): Payer: Medicare Other | Admitting: Podiatry

## 2017-07-27 DIAGNOSIS — E1142 Type 2 diabetes mellitus with diabetic polyneuropathy: Secondary | ICD-10-CM | POA: Diagnosis not present

## 2017-07-27 DIAGNOSIS — B351 Tinea unguium: Secondary | ICD-10-CM

## 2017-07-27 NOTE — Patient Instructions (Signed)
Seen for hypertrophic nails. All nails debrided. Return in 3 months or as needed.  

## 2017-07-27 NOTE — Progress Notes (Signed)
Subjective: 83 y.o. year old male patient presents complaining of painful nails. Patient requests toe nails, corns and calluses trimmed.   Objective: Dermatologic: Thick yellow deformed nails x 10. Interdigital corn 1st and 2nd left. Vascular: Pedal pulses are all palpable. Orthopedic: Contracted lesser digits  Neurologic: All epicritic and tactile sensations grossly intact.  Assessment: Dystrophic mycotic nails x 10. Painful corns 1st and 2nd left. Severe HAV with bunion bilateral. Diabetic neuropathy.  Treatment: All mycotic nails, corns, calluses debrided.  Return in 3 months or as needed.

## 2017-07-28 ENCOUNTER — Encounter: Payer: Self-pay | Admitting: Internal Medicine

## 2017-07-28 ENCOUNTER — Ambulatory Visit (INDEPENDENT_AMBULATORY_CARE_PROVIDER_SITE_OTHER): Payer: Medicare Other | Admitting: Internal Medicine

## 2017-07-28 VITALS — BP 134/84 | HR 70 | Temp 97.8°F | Ht 72.25 in | Wt 177.0 lb

## 2017-07-28 DIAGNOSIS — E785 Hyperlipidemia, unspecified: Secondary | ICD-10-CM

## 2017-07-28 DIAGNOSIS — E1140 Type 2 diabetes mellitus with diabetic neuropathy, unspecified: Secondary | ICD-10-CM

## 2017-07-28 DIAGNOSIS — I10 Essential (primary) hypertension: Secondary | ICD-10-CM

## 2017-07-28 LAB — POCT GLYCOSYLATED HEMOGLOBIN (HGB A1C): Hemoglobin A1C: 6.2 % — AB (ref 4.0–5.6)

## 2017-07-28 MED ORDER — LINAGLIPTIN 5 MG PO TABS: 5.0000 mg | ORAL_TABLET | Freq: Every day | ORAL | 3 refills | Status: DC

## 2017-07-28 MED ORDER — GLIMEPIRIDE 2 MG PO TABS: 2.0000 mg | ORAL_TABLET | Freq: Every day | ORAL | 3 refills | Status: DC

## 2017-07-28 NOTE — Assessment & Plan Note (Signed)
stable overall by history and exam, recent data reviewed with pt, and pt to continue medical treatment as before,  to f/u any worsening symptoms or concerns Lab Results  Component Value Date   LDLCALC 42 04/09/2017

## 2017-07-28 NOTE — Assessment & Plan Note (Signed)
stable overall by history and exam, recent data reviewed with pt, and pt to continue medical treatment as before,  to f/u any worsening symptoms or concerns Lab Results  Component Value Date   HGBA1C 6.7 (H) 04/09/2017

## 2017-07-28 NOTE — Progress Notes (Signed)
Subjective:    Patient ID: Nicholas Patton, male    DOB: 1934/04/23, 83 y.o.   MRN: [id number]  HPI  Here to f/u; overall doing ok,  Pt denies chest pain, increasing sob or doe, wheezing, orthopnea, PND, increased LE swelling, palpitations, dizziness or syncope.  Pt denies new neurological symptoms such as new headache, or facial or extremity weakness or numbness.  Pt denies polydipsia, polyuria, or low sugar episode.  Pt states overall good compliance with meds, mostly trying to follow appropriate diet, with wt overall stable,  but little exercise however.  Did start to walk a few blocks yesterday and plans to start doing better with activity.  Never started the tradjenta due to cost, has been off of metformin due to CKD for 5 days.  Willing to try increased glimeparide.    No new complaints or interval hx Past Medical History:  Diagnosis Date  . Anemia   . Arthritis   . Cataract    REMOVED BILATERAL  . Colon polyps   . DIABETES MELLITUS, TYPE II    takes Metformin and Glimepiride daily  . Diverticulosis   . GERD (gastroesophageal reflux disease)    takes Protonix daily  . History of colon polyps   . HYPERLIPIDEMIA    takes Atorvastatin daily  . HYPERTENSION    takes Amlodipine and Hyzaar daily  . Ileus following gastrointestinal surgery ([place]) 07/22/2012  . Joint pain   . Peripheral neuropathy   . Peripheral neuropathy   . PERIPHERAL NEUROPATHY, FEET 10/06/2007  . VITAMIN B12 DEFICIENCY 09/06/2008  . VITAMIN D DEFICIENCY 01/16/2010   takes Vitamin D daily   Past Surgical History:  Procedure Laterality Date  . CATARACT EXTRACTION     both eyes  . CHOLECYSTECTOMY N/A 07/18/2012   Procedure: LAPAROSCOPIC CHOLECYSTECTOMY WITH INTRAOPERATIVE CHOLANGIOGRAM;  Surgeon: Zenovia Jarred, MD;  Location: [facility];  Service: General;  Laterality: N/A;  . COLONOSCOPY    . POLYPECTOMY    . POSTERIOR CERVICAL FUSION/FORAMINOTOMY N/A 06/27/2015   Procedure: Posterior Cervical Fusion with lateral  mass fixation Cervical four to cervical seven;  Surgeon: Eustace Moore, MD;  Location: [facility];  Service: Neurosurgery;  Laterality: N/A;  . TOTAL KNEE ARTHROPLASTY  2001   Left  . TURP VAPORIZATION      reports that he has quit smoking. His smoking use included cigarettes. He smoked 0.00 packs per day. He has never used smokeless tobacco. He reports that he does not drink alcohol or use drugs. family history includes Diabetes Mellitus II in his mother; Lung cancer in his brother. No Known Allergies Current Outpatient Medications on File Prior to Visit  Medication Sig Dispense Refill  . amLODipine (NORVASC) 5 MG tablet TAKE 1 TABLET BY MOUTH  DAILY 90 tablet 1  . aspirin 81 MG EC tablet Take 81 mg by mouth daily.      Marland Kitchen atorvastatin (LIPITOR) 40 MG tablet TAKE 1 TABLET BY MOUTH  DAILY 90 tablet 1  . benzonatate (TESSALON PERLES) 100 MG capsule Take 1 capsule (100 mg total) by mouth 3 (three) times daily as needed for cough. 60 capsule 0  . Cholecalciferol (VITAMIN D3) 1000 UNITS CAPS Take 1 capsule (1,000 Units total) by mouth daily. 90 capsule 3  . ferrous sulfate 325 (65 FE) MG tablet Take 325 mg by mouth daily with breakfast.    . glimepiride (AMARYL) 1 MG tablet TAKE ONE TABLET BY  MOUTH once daily 90 tablet 3  . losartan-hydrochlorothiazide (  HYZAAR) 100-25 MG tablet TAKE 1 TABLET BY MOUTH  DAILY 90 tablet 1  . Multiple Vitamins-Minerals (MULTIVITAMIN PO) Take 1 tablet by mouth daily.    . pantoprazole (PROTONIX) 40 MG tablet TAKE 1 TABLET BY MOUTH  DAILY 90 tablet 1  . tamsulosin (FLOMAX) 0.4 MG CAPS capsule TAKE 1 CAPSULE BY MOUTH  DAILY 90 capsule 1   No current facility-administered medications on file prior to visit.    Review of Systems  Constitutional: Negative for other unusual diaphoresis or sweats HENT: Negative for ear discharge or swelling Eyes: Negative for other worsening visual disturbances Respiratory: Negative for stridor or other swelling  Gastrointestinal:  Negative for worsening distension or other blood Genitourinary: Negative for retention or other urinary change Musculoskeletal: Negative for other MSK pain or swelling Skin: Negative for color change or other new lesions Neurological: Negative for worsening tremors and other numbness  Psychiatric/Behavioral: Negative for worsening agitation or other fatigue All other system neg per pt    Objective:   Physical Exam BP 134/84   Pulse 70   Temp 97.8 F (36.6 C) (Oral)   Ht 6' 0.25" (1.835 m)   Wt 177 lb (80.3 kg)   SpO2 96%   BMI 23.84 kg/m  VS noted,  Constitutional: Pt appears in NAD HENT: Head: NCAT.  Right Ear: External ear normal.  Left Ear: External ear normal.  Eyes: . Pupils are equal, round, and reactive to light. Conjunctivae and EOM are normal Nose: without d/c or deformity Neck: Neck supple. Gross normal ROM Cardiovascular: Normal rate and regular rhythm.   Pulmonary/Chest: Effort normal and breath sounds without rales or wheezing.  Neurological: Pt is alert. At baseline orientation, motor grossly intact Skin: Skin is warm. No rashes, other new lesions, no LE edema Psychiatric: Pt behavior is normal without agitation  No other exam findings  Lab Results  Component Value Date   HGBA1C 6.7 (H) 04/09/2017   Lab Results  Component Value Date   CREATININE 1.69 (H) 06/18/2017   A1c - today - 6.2     Assessment & Plan:

## 2017-07-28 NOTE — Assessment & Plan Note (Signed)
stable overall by history and exam, recent data reviewed with pt, and pt to continue medical treatment as before,  to f/u any worsening symptoms or concerns BP Readings from Last 3 Encounters:  07/28/17 134/84  06/18/17 124/78  04/20/17 140/70

## 2017-07-28 NOTE — Patient Instructions (Addendum)
Your A1c was Excellent today at 6.2  Ok to stay off the tradjenta  OK to stop the metformin as you have  OK to increase the glimeparide to 2 mg in the AM  Please continue to monitor your blood sugars at home, and call if any recurring lower sugars < 110, as this may mean the glimeparide may be becoming too strong if the kidneys get any slower  Please continue all other medications as before, and refills have been done if requested.  Please have the pharmacy call with any other refills you may need.  Please continue your efforts at being more active, low cholesterol diabetic diet, and weight control.  Please keep your appointments with your specialists as you may have planned

## 2017-08-03 ENCOUNTER — Encounter: Payer: Self-pay | Admitting: Internal Medicine

## 2017-08-03 ENCOUNTER — Other Ambulatory Visit: Payer: Self-pay | Admitting: Nephrology

## 2017-08-03 DIAGNOSIS — M47816 Spondylosis without myelopathy or radiculopathy, lumbar region: Secondary | ICD-10-CM | POA: Diagnosis not present

## 2017-08-03 DIAGNOSIS — I10 Essential (primary) hypertension: Secondary | ICD-10-CM | POA: Diagnosis not present

## 2017-08-03 DIAGNOSIS — N183 Chronic kidney disease, stage 3 unspecified: Secondary | ICD-10-CM

## 2017-08-03 DIAGNOSIS — I129 Hypertensive chronic kidney disease with stage 1 through stage 4 chronic kidney disease, or unspecified chronic kidney disease: Secondary | ICD-10-CM

## 2017-08-04 ENCOUNTER — Ambulatory Visit
Admission: RE | Admit: 2017-08-04 | Discharge: 2017-08-04 | Disposition: A | Discharge status: Home / Self Care | Payer: Medicare Other | Source: Ambulatory Visit | Attending: Nephrology | Admitting: Nephrology

## 2017-08-04 DIAGNOSIS — N183 Chronic kidney disease, stage 3 unspecified: Secondary | ICD-10-CM

## 2017-08-04 DIAGNOSIS — I129 Hypertensive chronic kidney disease with stage 1 through stage 4 chronic kidney disease, or unspecified chronic kidney disease: Secondary | ICD-10-CM

## 2017-08-05 DIAGNOSIS — R293 Abnormal posture: Secondary | ICD-10-CM | POA: Diagnosis not present

## 2017-08-05 DIAGNOSIS — M545 Low back pain: Secondary | ICD-10-CM | POA: Diagnosis not present

## 2017-08-05 DIAGNOSIS — M6281 Muscle weakness (generalized): Secondary | ICD-10-CM | POA: Diagnosis not present

## 2017-08-05 DIAGNOSIS — M2560 Stiffness of unspecified joint, not elsewhere classified: Secondary | ICD-10-CM | POA: Diagnosis not present

## 2017-08-10 ENCOUNTER — Encounter: Payer: Self-pay | Admitting: Internal Medicine

## 2017-08-13 ENCOUNTER — Encounter: Payer: Self-pay | Admitting: Internal Medicine

## 2017-08-17 DIAGNOSIS — D631 Anemia in chronic kidney disease: Secondary | ICD-10-CM | POA: Diagnosis not present

## 2017-08-17 DIAGNOSIS — I129 Hypertensive chronic kidney disease with stage 1 through stage 4 chronic kidney disease, or unspecified chronic kidney disease: Secondary | ICD-10-CM | POA: Diagnosis not present

## 2017-09-12 ENCOUNTER — Encounter: Payer: Self-pay | Admitting: Internal Medicine

## 2017-09-14 DIAGNOSIS — H04123 Dry eye syndrome of bilateral lacrimal glands: Secondary | ICD-10-CM | POA: Diagnosis not present

## 2017-09-14 DIAGNOSIS — Z961 Presence of intraocular lens: Secondary | ICD-10-CM | POA: Diagnosis not present

## 2017-09-14 DIAGNOSIS — H43821 Vitreomacular adhesion, right eye: Secondary | ICD-10-CM | POA: Diagnosis not present

## 2017-09-14 DIAGNOSIS — H40013 Open angle with borderline findings, low risk, bilateral: Secondary | ICD-10-CM | POA: Diagnosis not present

## 2017-09-14 LAB — HM DIABETES EYE EXAM

## 2017-09-17 ENCOUNTER — Encounter: Payer: Self-pay | Admitting: Internal Medicine

## 2017-09-17 ENCOUNTER — Ambulatory Visit (INDEPENDENT_AMBULATORY_CARE_PROVIDER_SITE_OTHER): Payer: Medicare Other | Admitting: Internal Medicine

## 2017-09-17 VITALS — BP 140/60 | HR 71 | Ht 72.25 in | Wt 182.0 lb

## 2017-09-17 DIAGNOSIS — N183 Chronic kidney disease, stage 3 unspecified: Secondary | ICD-10-CM

## 2017-09-17 DIAGNOSIS — R609 Edema, unspecified: Secondary | ICD-10-CM

## 2017-09-17 DIAGNOSIS — E1140 Type 2 diabetes mellitus with diabetic neuropathy, unspecified: Secondary | ICD-10-CM

## 2017-09-17 DIAGNOSIS — I10 Essential (primary) hypertension: Secondary | ICD-10-CM | POA: Diagnosis not present

## 2017-09-17 DIAGNOSIS — R600 Localized edema: Secondary | ICD-10-CM | POA: Insufficient documentation

## 2017-09-17 MED ORDER — GLUCOSE BLOOD VI STRP: ORAL_STRIP | 12 refills | Status: DC

## 2017-09-17 NOTE — Assessment & Plan Note (Signed)
stable overall by history and exam, recent data reviewed with pt, and pt to continue medical treatment as before,  to f/u any worsening symptoms or concerns

## 2017-09-17 NOTE — Progress Notes (Signed)
Subjective:    Patient ID: Nicholas Patton, male    DOB: [birth year], 83 y.o.   MRN: [id number]  HPIn  Here to f/u after seeing renal with HCT 25 mg discontinued, but losartan 100 mg continued.  F/u renal fxn some improved per pt report per renal.  Unfortunately also has had mild bilat ankle edema left > right, worse in the evening, better in the AM.  Swelling some improved with support hose.  Wt is up several lbs. Wt Readings from Last 3 Encounters:  09/17/17 182 lb (82.6 kg)  07/28/17 177 lb (80.3 kg)  06/18/17 178 lb (80.7 kg)  Pt denies chest pain, increased sob or doe, wheezing, orthopnea, PND, palpitations, dizziness or syncope.   Pt denies polydipsia, polyuria Past Medical History:  Diagnosis Date  . Anemia   . Arthritis   . Cataract    REMOVED BILATERAL  . Colon polyps   . DIABETES MELLITUS, TYPE II    takes Metformin and Glimepiride daily  . Diverticulosis   . GERD (gastroesophageal reflux disease)    takes Protonix daily  . History of colon polyps   . HYPERLIPIDEMIA    takes Atorvastatin daily  . HYPERTENSION    takes Amlodipine and Hyzaar daily  . Ileus following gastrointestinal surgery ([place]) 07/22/2012  . Joint pain   . Peripheral neuropathy   . Peripheral neuropathy   . PERIPHERAL NEUROPATHY, FEET 10/06/2007  . VITAMIN B12 DEFICIENCY 09/06/2008  . VITAMIN D DEFICIENCY 01/16/2010   takes Vitamin D daily   Past Surgical History:  Procedure Laterality Date  . CATARACT EXTRACTION     both eyes  . CHOLECYSTECTOMY N/A 07/18/2012   Procedure: LAPAROSCOPIC CHOLECYSTECTOMY WITH INTRAOPERATIVE CHOLANGIOGRAM;  Surgeon: Zenovia Jarred, MD;  Location: [facility];  Service: General;  Laterality: N/A;  . COLONOSCOPY    . POLYPECTOMY    . POSTERIOR CERVICAL FUSION/FORAMINOTOMY N/A 06/27/2015   Procedure: Posterior Cervical Fusion with lateral mass fixation Cervical four to cervical seven;  Surgeon: Eustace Moore, MD;  Location: [facility];  Service: Neurosurgery;  Laterality: N/A;   . TOTAL KNEE ARTHROPLASTY  2001   Left  . TURP VAPORIZATION      reports that he has quit smoking. His smoking use included cigarettes. He smoked 0.00 packs per day. He has never used smokeless tobacco. He reports that he does not drink alcohol or use drugs. family history includes Diabetes Mellitus II in his mother; Lung cancer in his brother. No Known Allergies Current Outpatient Medications on File Prior to Visit  Medication Sig Dispense Refill  . amLODipine (NORVASC) 5 MG tablet TAKE 1 TABLET BY MOUTH  DAILY 90 tablet 1  . aspirin 81 MG EC tablet Take 81 mg by mouth daily.      Marland Kitchen atorvastatin (LIPITOR) 40 MG tablet TAKE 1 TABLET BY MOUTH  DAILY 90 tablet 1  . benzonatate (TESSALON PERLES) 100 MG capsule Take 1 capsule (100 mg total) by mouth 3 (three) times daily as needed for cough. 60 capsule 0  . Cholecalciferol (VITAMIN D3) 1000 UNITS CAPS Take 1 capsule (1,000 Units total) by mouth daily. 90 capsule 3  . ferrous sulfate 325 (65 FE) MG tablet Take 325 mg by mouth daily with breakfast.    . glimepiride (AMARYL) 2 MG tablet Take 1 tablet (2 mg total) by mouth daily before breakfast. 90 tablet 3  . Multiple Vitamins-Minerals (MULTIVITAMIN PO) Take 1 tablet by mouth daily.    . pantoprazole (PROTONIX) 40  MG tablet TAKE 1 TABLET BY MOUTH  DAILY 90 tablet 1  . tamsulosin (FLOMAX) 0.4 MG CAPS capsule TAKE 1 CAPSULE BY MOUTH  DAILY 90 capsule 1  . losartan (COZAAR) 100 MG tablet Take 1 tablet by mouth daily.  6   No current facility-administered medications on file prior to visit.    Review of Systems  Constitutional: Negative for other unusual diaphoresis or sweats HENT: Negative for ear discharge or swelling Eyes: Negative for other worsening visual disturbances Respiratory: Negative for stridor or other swelling  Gastrointestinal: Negative for worsening distension or other blood Genitourinary: Negative for retention or other urinary change Musculoskeletal: Negative for other MSK  pain or swelling Skin: Negative for color change or other new lesions Neurological: Negative for worsening tremors and other numbness  Psychiatric/Behavioral: Negative for worsening agitation or other fatigue All other system neg per pt    Objective:   Physical Exam BP 140/60 (BP Location: Left Arm, Patient Position: Sitting, Cuff Size: Normal)   Pulse 71   Ht 6' 0.25" (1.835 m)   Wt 182 lb (82.6 kg)   SpO2 96%   BMI 24.51 kg/m  VS noted,  Constitutional: Pt appears in NAD HENT: Head: NCAT.  Right Ear: External ear normal.  Left Ear: External ear normal.  Eyes: . Pupils are equal, round, and reactive to light. Conjunctivae and EOM are normal Nose: without d/c or deformity Neck: Neck supple. Gross normal ROM Cardiovascular: Normal rate and regular rhythm.   Pulmonary/Chest: Effort normal and breath sounds without rales or wheezing.  Neurological: Pt is alert. At baseline orientation, motor grossly intact Skin: Skin is warm. No rashes, other new lesions, trace to 1+ bilat ankle edema left > right Psychiatric: Pt behavior is normal without agitation  No other exam findings    Assessment & Plan:

## 2017-09-17 NOTE — Assessment & Plan Note (Signed)
stable overall by history and exam, recent data reviewed with pt, and pt to continue medical treatment as before,  to f/u any worsening symptoms or concerns  

## 2017-09-17 NOTE — Patient Instructions (Addendum)
Please continue all other medications as before, and refills have been done if requested.  Please have the pharmacy call with any other refills you may need.  Please continue your efforts at being more active, low cholesterol diet, and weight control.  Please keep your appointments with your specialists as you may have planned - renal on Monday, and let them know about the wt increase and leg swelling

## 2017-09-17 NOTE — Assessment & Plan Note (Signed)
C/w likely venous insufficiency exac by stopping the hct recently; d/w pt who has appt Monday with renal, so he is asked to address further need for diuretic in light of recent hct stop and renal fxn improved, for leg elevation, low salt diet, compression stocking and wt control

## 2017-09-20 DIAGNOSIS — D631 Anemia in chronic kidney disease: Secondary | ICD-10-CM | POA: Diagnosis not present

## 2017-09-20 DIAGNOSIS — I129 Hypertensive chronic kidney disease with stage 1 through stage 4 chronic kidney disease, or unspecified chronic kidney disease: Secondary | ICD-10-CM | POA: Diagnosis not present

## 2017-09-20 DIAGNOSIS — R600 Localized edema: Secondary | ICD-10-CM | POA: Diagnosis not present

## 2017-09-20 DIAGNOSIS — N183 Chronic kidney disease, stage 3 (moderate): Secondary | ICD-10-CM | POA: Diagnosis not present

## 2017-09-20 DIAGNOSIS — E559 Vitamin D deficiency, unspecified: Secondary | ICD-10-CM | POA: Diagnosis not present

## 2017-10-04 DIAGNOSIS — I129 Hypertensive chronic kidney disease with stage 1 through stage 4 chronic kidney disease, or unspecified chronic kidney disease: Secondary | ICD-10-CM | POA: Diagnosis not present

## 2017-10-04 DIAGNOSIS — N183 Chronic kidney disease, stage 3 (moderate): Secondary | ICD-10-CM | POA: Diagnosis not present

## 2017-10-07 ENCOUNTER — Telehealth: Payer: Self-pay | Admitting: Hematology

## 2017-10-07 NOTE — Telephone Encounter (Signed)
New hematology referral received from Dr. Carolin Sicks for Iga monoclonal protein with kappa light chains. Cld and spoke to the pt and scheduled him to see Dr. Burr Medico on 8/20 at 1230pm. Pt aware to arrive 30 minutes early. Letter mailed.

## 2017-10-08 ENCOUNTER — Other Ambulatory Visit (INDEPENDENT_AMBULATORY_CARE_PROVIDER_SITE_OTHER): Payer: Medicare Other

## 2017-10-08 ENCOUNTER — Ambulatory Visit (INDEPENDENT_AMBULATORY_CARE_PROVIDER_SITE_OTHER): Payer: Medicare Other | Admitting: Internal Medicine

## 2017-10-08 ENCOUNTER — Telehealth: Payer: Self-pay | Admitting: Hematology and Oncology

## 2017-10-08 ENCOUNTER — Encounter: Payer: Self-pay | Admitting: Hematology and Oncology

## 2017-10-08 ENCOUNTER — Encounter: Payer: Self-pay | Admitting: Internal Medicine

## 2017-10-08 VITALS — BP 138/70 | HR 55 | Temp 98.3°F | Ht 72.25 in | Wt 177.0 lb

## 2017-10-08 DIAGNOSIS — E1140 Type 2 diabetes mellitus with diabetic neuropathy, unspecified: Secondary | ICD-10-CM

## 2017-10-08 DIAGNOSIS — I10 Essential (primary) hypertension: Secondary | ICD-10-CM | POA: Diagnosis not present

## 2017-10-08 DIAGNOSIS — R779 Abnormality of plasma protein, unspecified: Secondary | ICD-10-CM | POA: Insufficient documentation

## 2017-10-08 DIAGNOSIS — E8809 Other disorders of plasma-protein metabolism, not elsewhere classified: Secondary | ICD-10-CM | POA: Diagnosis not present

## 2017-10-08 DIAGNOSIS — E785 Hyperlipidemia, unspecified: Secondary | ICD-10-CM | POA: Diagnosis not present

## 2017-10-08 DIAGNOSIS — N183 Chronic kidney disease, stage 3 unspecified: Secondary | ICD-10-CM

## 2017-10-08 LAB — LIPID PANEL
Cholesterol: 121 mg/dL (ref 0–200)
HDL: 56.9 mg/dL (ref >39.00)
LDL Cholesterol: 52 mg/dL (ref 0–99)
NonHDL: 64.1
Total CHOL/HDL Ratio: 2
Triglycerides: 60 mg/dL (ref 0.0–149.0)
VLDL: 12 mg/dL (ref 0.0–40.0)

## 2017-10-08 LAB — BASIC METABOLIC PANEL
BUN: 27 mg/dL — ABNORMAL HIGH (ref 6–23)
CO2: 25 mEq/L (ref 19–32)
Calcium: 10.1 mg/dL (ref 8.4–10.5)
Chloride: 108 mEq/L (ref 96–112)
Creatinine, Ser: 1.74 mg/dL — ABNORMAL HIGH (ref 0.40–1.50)
GFR: 48.64 mL/min — ABNORMAL LOW (ref >60.00)
Glucose, Bld: 119 mg/dL — ABNORMAL HIGH (ref 70–99)
Potassium: 4.3 mEq/L (ref 3.5–5.1)
Sodium: 142 mEq/L (ref 135–145)

## 2017-10-08 LAB — HEMOGLOBIN A1C: Hgb A1c MFr Bld: 6.7 % — ABNORMAL HIGH (ref 4.6–6.5)

## 2017-10-08 LAB — HEPATIC FUNCTION PANEL
ALT: 18 U/L (ref 0–53)
AST: 23 U/L (ref 0–37)
Albumin: 4.4 g/dL (ref 3.5–5.2)
Alkaline Phosphatase: 92 U/L (ref 39–117)
Bilirubin, Direct: 0.1 mg/dL (ref 0.0–0.3)
Total Bilirubin: 0.6 mg/dL (ref 0.2–1.2)
Total Protein: 7 g/dL (ref 6.0–8.3)

## 2017-10-08 NOTE — Assessment & Plan Note (Signed)
stable overall by history and exam, recent data reviewed with pt, and pt to continue medical treatment as before,  to f/u any worsening symptoms or concerns, for f/u lab and f/u renal as planned

## 2017-10-08 NOTE — Telephone Encounter (Signed)
Mr. Blankenhorn called to reschedule his hematology appt to 8/22 to see Dr. Lindi Adie at 345pm. Pt's daughter brings him to his appts and need the latest afternoon appt. Pt has been made aware to arrive 30 minutes early. Letter mailed.

## 2017-10-08 NOTE — Assessment & Plan Note (Signed)
Also to add the SPEP

## 2017-10-08 NOTE — Assessment & Plan Note (Signed)
stable overall by history and exam, recent data reviewed with pt, and pt to continue medical treatment as before,  to f/u any worsening symptoms or concerns, for f/u lab today, goal ldl < 70

## 2017-10-08 NOTE — Assessment & Plan Note (Signed)
stable overall by history and exam, recent data reviewed with pt, and pt to continue medical treatment as before,  to f/u any worsening symptoms or concerns, for f/u a1c today with labs

## 2017-10-08 NOTE — Patient Instructions (Signed)

## 2017-10-08 NOTE — Assessment & Plan Note (Signed)
stable overall by history and exam, recent data reviewed with pt, and pt to continue medical treatment as before,  to f/u any worsening symptoms or concerns

## 2017-10-08 NOTE — Progress Notes (Signed)
Subjective:    Patient ID: Nicholas Patton, male    DOB: [birth year], 83 y.o.   MRN: [id number]  HPI  Here to f/u; overall doing ok,  Pt denies chest pain, increasing sob or doe, wheezing, orthopnea, PND, increased LE swelling, palpitations, dizziness or syncope.  Pt denies new neurological symptoms such as new headache, or facial or extremity weakness or numbness.  Pt denies polydipsia, polyuria, or low sugar episode.  Pt states overall good compliance with meds, mostly trying to follow appropriate diet, with wt overall stable,  but little exercise however.  Pt states does have recent elevated protein in his system and asks for evaluation.  No hx of MGUS or MM.  No new complaints.  Edema appears to have resolved with stopping the amlodipine  BP at home usually 140[-145 sbp - ok per pt per renal Past Medical History:  Diagnosis Date  . Anemia   . Arthritis   . Cataract    REMOVED BILATERAL  . Colon polyps   . DIABETES MELLITUS, TYPE II    takes Metformin and Glimepiride daily  . Diverticulosis   . GERD (gastroesophageal reflux disease)    takes Protonix daily  . History of colon polyps   . HYPERLIPIDEMIA    takes Atorvastatin daily  . HYPERTENSION    takes Amlodipine and Hyzaar daily  . Ileus following gastrointestinal surgery ([place]) 07/22/2012  . Joint pain   . Peripheral neuropathy   . Peripheral neuropathy   . PERIPHERAL NEUROPATHY, FEET 10/06/2007  . VITAMIN B12 DEFICIENCY 09/06/2008  . VITAMIN D DEFICIENCY 01/16/2010   takes Vitamin D daily   Past Surgical History:  Procedure Laterality Date  . CATARACT EXTRACTION     both eyes  . CHOLECYSTECTOMY N/A 07/18/2012   Procedure: LAPAROSCOPIC CHOLECYSTECTOMY WITH INTRAOPERATIVE CHOLANGIOGRAM;  Surgeon: Zenovia Jarred, MD;  Location: [facility];  Service: General;  Laterality: N/A;  . COLONOSCOPY    . POLYPECTOMY    . POSTERIOR CERVICAL FUSION/FORAMINOTOMY N/A 06/27/2015   Procedure: Posterior Cervical Fusion with lateral mass fixation  Cervical four to cervical seven;  Surgeon: Eustace Moore, MD;  Location: [facility];  Service: Neurosurgery;  Laterality: N/A;  . TOTAL KNEE ARTHROPLASTY  2001   Left  . TURP VAPORIZATION      reports that he has quit smoking. His smoking use included cigarettes. He smoked 0.00 packs per day. He has never used smokeless tobacco. He reports that he does not drink alcohol or use drugs. family history includes Diabetes Mellitus II in his mother; Lung cancer in his brother. No Known Allergies Current Outpatient Medications on File Prior to Visit  Medication Sig Dispense Refill  . aspirin 81 MG EC tablet Take 81 mg by mouth daily.      Marland Kitchen atorvastatin (LIPITOR) 40 MG tablet TAKE 1 TABLET BY MOUTH  DAILY 90 tablet 1  . Cholecalciferol (VITAMIN D3) 1000 UNITS CAPS Take 1 capsule (1,000 Units total) by mouth daily. 90 capsule 3  . ferrous sulfate 325 (65 FE) MG tablet Take 325 mg by mouth daily with breakfast.    . glimepiride (AMARYL) 2 MG tablet Take 1 tablet (2 mg total) by mouth daily before breakfast. 90 tablet 3  . glucose blood (ONE TOUCH ULTRA TEST) test strip Use as instructed E11.9 200 each 12  . losartan (COZAAR) 100 MG tablet Take 1 tablet by mouth daily.  6  . Multiple Vitamins-Minerals (MULTIVITAMIN PO) Take 1 tablet by mouth daily.    Marland Kitchen  pantoprazole (PROTONIX) 40 MG tablet TAKE 1 TABLET BY MOUTH  DAILY 90 tablet 1  . tamsulosin (FLOMAX) 0.4 MG CAPS capsule TAKE 1 CAPSULE BY MOUTH  DAILY 90 capsule 1  . amLODipine (NORVASC) 5 MG tablet TAKE 1 TABLET BY MOUTH  DAILY (Patient not taking: Reported on 10/08/2017) 90 tablet 1  . benzonatate (TESSALON PERLES) 100 MG capsule Take 1 capsule (100 mg total) by mouth 3 (three) times daily as needed for cough. (Patient not taking: Reported on 10/08/2017) 60 capsule 0   No current facility-administered medications on file prior to visit.    Review of Systems  Constitutional: Negative for other unusual diaphoresis or sweats HENT: Negative for ear  discharge or swelling Eyes: Negative for other worsening visual disturbances Respiratory: Negative for stridor or other swelling  Gastrointestinal: Negative for worsening distension or other blood Genitourinary: Negative for retention or other urinary change Musculoskeletal: Negative for other MSK pain or swelling Skin: Negative for color change or other new lesions Neurological: Negative for worsening tremors and other numbness  Psychiatric/Behavioral: Negative for worsening agitation or other fatigue All other system neg per pt    Objective:   Physical Exam BP 138/70 (BP Location: Left Arm, Patient Position: Sitting, Cuff Size: Normal)   Pulse (!) 55   Temp 98.3 F (36.8 C) (Oral)   Ht 6' 0.25" (1.835 m)   Wt 177 lb (80.3 kg)   SpO2 95%   BMI 23.84 kg/m  VS noted,  Constitutional: Pt appears in NAD HENT: Head: NCAT.  Right Ear: External ear normal.  Left Ear: External ear normal.  Eyes: . Pupils are equal, round, and reactive to light. Conjunctivae and EOM are normal Nose: without d/c or deformity Neck: Neck supple. Gross normal ROM Cardiovascular: Normal rate and regular rhythm.   Pulmonary/Chest: Effort normal and breath sounds without rales or wheezing.  Neurological: Pt is alert. At baseline orientation, motor grossly intact Skin: Skin is warm. No rashes, other new lesions, no LE edema Psychiatric: Pt behavior is normal without agitation  No other exam findings  Lab Results  Component Value Date   WBC 5.2 05/18/2017   HGB 13.1 05/18/2017   HCT 39.5 05/18/2017   PLT 195.0 05/18/2017   GLUCOSE 103 (H) 06/18/2017   CHOL 122 04/09/2017   TRIG 60.0 04/09/2017   HDL 68.40 04/09/2017   LDLCALC 42 04/09/2017   ALT 14 04/09/2017   AST 20 04/09/2017   NA 143 06/18/2017   K 4.7 06/18/2017   CL 106 06/18/2017   CREATININE 1.69 (H) 06/18/2017   BUN 31 (H) 06/18/2017   CO2 26 06/18/2017   TSH 3.84 04/09/2017   PSA 4.61 (H) 04/09/2017   INR 1.12 06/19/2015   HGBA1C  6.2 (A) 07/28/2017   MICROALBUR <0.7 04/09/2017       Assessment & Plan:

## 2017-10-26 ENCOUNTER — Encounter: Payer: Medicare Other | Admitting: Hematology

## 2017-10-27 ENCOUNTER — Encounter: Payer: Self-pay | Admitting: Podiatry

## 2017-10-27 ENCOUNTER — Ambulatory Visit (INDEPENDENT_AMBULATORY_CARE_PROVIDER_SITE_OTHER): Payer: Medicare Other | Admitting: Podiatry

## 2017-10-27 DIAGNOSIS — B351 Tinea unguium: Secondary | ICD-10-CM

## 2017-10-27 DIAGNOSIS — E1142 Type 2 diabetes mellitus with diabetic polyneuropathy: Secondary | ICD-10-CM | POA: Diagnosis not present

## 2017-10-27 DIAGNOSIS — M79671 Pain in right foot: Secondary | ICD-10-CM | POA: Diagnosis not present

## 2017-10-27 DIAGNOSIS — M79672 Pain in left foot: Secondary | ICD-10-CM

## 2017-10-27 DIAGNOSIS — M2042 Other hammer toe(s) (acquired), left foot: Secondary | ICD-10-CM | POA: Diagnosis not present

## 2017-10-27 DIAGNOSIS — L84 Corns and callosities: Secondary | ICD-10-CM | POA: Diagnosis not present

## 2017-10-27 NOTE — Progress Notes (Signed)
Subjective: 83 y.o. year old male patient presents complaining of painful nails. Patient requests toe nails, corns and calluses trimmed.  Between the toes on left foot has been very painful for a couple of weeks.  Objective: Dermatologic: Thick yellow deformed nails x 10. Painful corn 1st and 2nd at contact surface left foot. Digital callus at medial nail border right great toe painful. Vascular: Pedal pulses are all palpable. Orthopedic: Contracted lesser digits with Hallux valgus and bunion deformity bilateral. Neurologic: Diagnosed with Diabetic peripheral neuropathy.  Assessment: Dystrophic mycotic nails x 10. Contracted digits with interdigital lesions symptomatic. Diabetic peripheral neuropathy.  Treatment: All mycotic nails, corns, calluses debrided.  Return in 3 months or as needed.

## 2017-10-27 NOTE — Patient Instructions (Signed)
Seen for hypertrophic nails and painful corns and calluses. All nails, corns, and calluses debrided. Return in 3 months or sooner if needed.

## 2017-10-28 ENCOUNTER — Inpatient Hospital Stay: Payer: Medicare Other | Attending: Hematology and Oncology | Admitting: Hematology and Oncology

## 2017-10-28 DIAGNOSIS — D472 Monoclonal gammopathy: Secondary | ICD-10-CM | POA: Insufficient documentation

## 2017-10-28 NOTE — Progress Notes (Signed)
[facility] NOTE  Patient Care Team: [name], MD as PCP - General  CHIEF COMPLAINTS/PURPOSE OF CONSULTATION:  Elevation of monoclonal protein  HISTORY OF PRESENTING ILLNESS:  Nicholas Patton 83 y.o. male is here because of recent diagnosis of elevated monoclonal protein.  Patient has diabetes and hypertension and chronic kidney disease.  As part of the work-up he had serum protein electrophoresis which revealed 0.2 g of IgG kappa monoclonal protein.  Because of this he was referred to see me for further evaluation.  He is working very hard to control his blood pressure and diabetes.  His medications were recently changed and it appears that his blood sugar and blood pressures have improved.  He does not have any bone pain.  I reviewed her records extensively and collaborated the history with the patient.  MEDICAL HISTORY:  Past Medical History:  Diagnosis Date  . Anemia   . Arthritis   . Cataract    REMOVED BILATERAL  . Colon polyps   . DIABETES MELLITUS, TYPE II    takes Metformin and Glimepiride daily  . Diverticulosis   . GERD (gastroesophageal reflux disease)    takes Protonix daily  . History of colon polyps   . HYPERLIPIDEMIA    takes Atorvastatin daily  . HYPERTENSION    takes Amlodipine and Hyzaar daily  . Ileus following gastrointestinal surgery ([place]) 07/22/2012  . Joint pain   . Peripheral neuropathy   . Peripheral neuropathy   . PERIPHERAL NEUROPATHY, FEET 10/06/2007  . VITAMIN B12 DEFICIENCY 09/06/2008  . VITAMIN D DEFICIENCY 01/16/2010   takes Vitamin D daily    SURGICAL HISTORY: Past Surgical History:  Procedure Laterality Date  . CATARACT EXTRACTION     both eyes  . CHOLECYSTECTOMY N/A 07/18/2012   Procedure: LAPAROSCOPIC CHOLECYSTECTOMY WITH INTRAOPERATIVE CHOLANGIOGRAM;  Surgeon: Zenovia Jarred, MD;  Location: [facility];  Service: General;  Laterality: N/A;  . COLONOSCOPY    . POLYPECTOMY    . POSTERIOR CERVICAL  FUSION/FORAMINOTOMY N/A 06/27/2015   Procedure: Posterior Cervical Fusion with lateral mass fixation Cervical four to cervical seven;  Surgeon: Eustace Moore, MD;  Location: [facility];  Service: Neurosurgery;  Laterality: N/A;  . TOTAL KNEE ARTHROPLASTY  2001   Left  . TURP VAPORIZATION      SOCIAL HISTORY: Social History   Socioeconomic History  . Marital status: Married    Spouse name: Not on file  . Number of children: Not on file  . Years of education: Not on file  . Highest education level: Not on file  Occupational History  . Not on file  Social Needs  . Financial resource strain: Not on file  . Food insecurity:    Worry: Not on file    Inability: Not on file  . Transportation needs:    Medical: Not on file    Non-medical: Not on file  Tobacco Use  . Smoking status: Former Smoker    Packs/day: 0.00    Types: Cigarettes  . Smokeless tobacco: Never Used  . Tobacco comment: quit smoking around 1990  Substance and Sexual Activity  . Alcohol use: No    Alcohol/week: 0.0 standard drinks  . Drug use: No  . Sexual activity: Never  Lifestyle  . Physical activity:    Days per week: Not on file    Minutes per session: Not on file  . Stress: Not on file  Relationships  . Social connections:    Talks  on phone: Not on file    Gets together: Not on file    Attends religious service: Not on file    Active member of club or organization: Not on file    Attends meetings of clubs or organizations: Not on file    Relationship status: Not on file  . Intimate partner violence:    Fear of current or ex partner: Not on file    Emotionally abused: Not on file    Physically abused: Not on file    Forced sexual activity: Not on file  Other Topics Concern  . Not on file  Social History Narrative  . Not on file    FAMILY HISTORY: Family History  Problem Relation Age of Onset  . Diabetes Mellitus II Mother   . Lung cancer Brother   . Colon cancer Neg Hx   . Esophageal  cancer Neg Hx   . Rectal cancer Neg Hx   . Stomach cancer Neg Hx     ALLERGIES:  has No Known Allergies.  MEDICATIONS:  Current Outpatient Medications  Medication Sig Dispense Refill  . amLODipine (NORVASC) 5 MG tablet TAKE 1 TABLET BY MOUTH  DAILY (Patient not taking: Reported on 10/08/2017) 90 tablet 1  . aspirin 81 MG EC tablet Take 81 mg by mouth daily.      Marland Kitchen atorvastatin (LIPITOR) 40 MG tablet TAKE 1 TABLET BY MOUTH  DAILY 90 tablet 1  . benzonatate (TESSALON PERLES) 100 MG capsule Take 1 capsule (100 mg total) by mouth 3 (three) times daily as needed for cough. (Patient not taking: Reported on 10/08/2017) 60 capsule 0  . Cholecalciferol (VITAMIN D3) 1000 UNITS CAPS Take 1 capsule (1,000 Units total) by mouth daily. 90 capsule 3  . ferrous sulfate 325 (65 FE) MG tablet Take 325 mg by mouth daily with breakfast.    . glimepiride (AMARYL) 2 MG tablet Take 1 tablet (2 mg total) by mouth daily before breakfast. 90 tablet 3  . glucose blood (ONE TOUCH ULTRA TEST) test strip Use as instructed E11.9 200 each 12  . losartan (COZAAR) 100 MG tablet Take 1 tablet by mouth daily.  6  . Multiple Vitamins-Minerals (MULTIVITAMIN PO) Take 1 tablet by mouth daily.    . pantoprazole (PROTONIX) 40 MG tablet TAKE 1 TABLET BY MOUTH  DAILY 90 tablet 1  . tamsulosin (FLOMAX) 0.4 MG CAPS capsule TAKE 1 CAPSULE BY MOUTH  DAILY 90 capsule 1   No current facility-administered medications for this visit.     REVIEW OF SYSTEMS:   Constitutional: Denies fevers, chills or abnormal night sweats Eyes: Denies blurriness of vision, double vision or watery eyes Ears, nose, mouth, throat, and face: Denies mucositis or sore throat Respiratory: Denies cough, dyspnea or wheezes Cardiovascular: Denies palpitation, chest discomfort or lower extremity swelling Gastrointestinal:  Denies nausea, heartburn or change in bowel habits Skin: Denies abnormal skin rashes Lymphatics: Denies new lymphadenopathy or easy  bruising Neurological:Denies numbness, tingling or new weaknesses Behavioral/Psych: Mood is stable, no new changes    All other systems were reviewed with the patient and are negative.  PHYSICAL EXAMINATION: ECOG PERFORMANCE STATUS: 1 - Symptomatic but completely ambulatory  Vitals:   10/28/17 1600  BP: (!) 138/51  Pulse: (!) 56  Resp: 17  Temp: 97.9 F (36.6 C)  SpO2: 95%   Filed Weights   10/28/17 1600  Weight: 178 lb 11.2 oz (81.1 kg)    GENERAL:alert, no distress and comfortable SKIN: skin color, texture, turgor are  normal, no rashes or significant lesions EYES: normal, conjunctiva are pink and non-injected, sclera clear OROPHARYNX:no exudate, no erythema and lips, buccal mucosa, and tongue normal  NECK: supple, thyroid normal size, non-tender, without nodularity LYMPH:  no palpable lymphadenopathy in the cervical, axillary or inguinal LUNGS: clear to auscultation and percussion with normal breathing effort HEART: regular rate & rhythm and no murmurs and no lower extremity edema ABDOMEN:abdomen soft, non-tender and normal bowel sounds Musculoskeletal:no cyanosis of digits and no clubbing  PSYCH: alert & oriented x 3 with fluent speech NEURO: no focal motor/sensory deficits   LABORATORY DATA:  I have reviewed the data as listed Lab Results  Component Value Date   WBC 5.2 05/18/2017   HGB 13.1 05/18/2017   HCT 39.5 05/18/2017   MCV 87.3 05/18/2017   PLT 195.0 05/18/2017   Lab Results  Component Value Date   NA 142 10/08/2017   K 4.3 10/08/2017   CL 108 10/08/2017   CO2 25 10/08/2017    RADIOGRAPHIC STUDIES: I have personally reviewed the radiological reports and agreed with the findings in the report.  ASSESSMENT AND PLAN:  MGUS (monoclonal gammopathy of unknown significance) Blood work review 09/20/2017: M protein 0.2 g (IgA kappa by immunofixation) Quantitative immunoglobulins: IgG 569, IgA 219, IgM 28 Serum creatinine 1.73, albumin 4.3, calcium  9.9 Hemoglobin 12.5 MCV 86 Iron saturation 20%, TIBC 313  Counseling: I discussed with the patient the spectrum of disorders from MGUS to multiple myeloma. We discussed the role of plasma cells in producing immunoglobulins. We discussed structure of immunoglobulins on how they make up the heavy chains and the light chains. The light chains are Kappa and lambda. I discussed the difference between MGUS and multiple myeloma. MGUS is characterized by elevation monoclonal protein without any end organ damage. Multiple myeloma is associated with elevation monoclonal protein and end organ damage (hypercalcemia, renal dysfunction, anemia, bone lytic lesions ) along with a bone marrow showing greater than 10% plasma cells.  Workup recommended: Bone survey I do not recommend bone marrow biopsy at this time unless he has additional findings suggestive of myeloma. I will call the patient with the results of the bone survey. Return to clinic in 1 year with labs done 1 week ahead of time and follow-up.  All questions were answered. The patient knows to call the clinic with any problems, questions or concerns.    Harriette Ohara, MD 10/28/17

## 2017-10-28 NOTE — Assessment & Plan Note (Signed)
Blood work review 09/20/2017: M protein 0.2 g (IgA kappa by immunofixation) Quantitative immunoglobulins: IgG 569, IgA 219, IgM 28 Serum creatinine 1.73, albumin 4.3, calcium 9.9 Hemoglobin 12.5 MCV 86 Iron saturation 20%, TIBC 313  Counseling: I discussed with the patient the spectrum of disorders from MGUS to multiple myeloma. We discussed the role of plasma cells in producing immunoglobulins. We discussed structure of immunoglobulins on how they make up the heavy chains and the light chains. The light chains are Kappa and lambda. I discussed the difference between MGUS and multiple myeloma. MGUS is characterized by elevation monoclonal protein without any end organ damage. Multiple myeloma is associated with elevation monoclonal protein and end organ damage (hypercalcemia, renal dysfunction, anemia, bone lytic lesions ) along with a bone marrow showing greater than 10% plasma cells.  Workup recommended: Bone survey I do not recommend bone marrow biopsy at this time unless he has additional findings suggestive of myeloma.

## 2017-10-29 ENCOUNTER — Telehealth: Payer: Self-pay | Admitting: Hematology and Oncology

## 2017-10-29 NOTE — Telephone Encounter (Signed)
Per 8/22 los.  Mailed patient calendar for Aug 2020 appts.

## 2017-11-09 ENCOUNTER — Telehealth: Payer: Self-pay | Admitting: Hematology and Oncology

## 2017-11-09 ENCOUNTER — Ambulatory Visit (HOSPITAL_COMMUNITY)
Admission: RE | Admit: 2017-11-09 | Discharge: 2017-11-09 | Disposition: A | Discharge status: Home / Self Care | Payer: Medicare Other | Source: Ambulatory Visit | Attending: Hematology and Oncology | Admitting: Hematology and Oncology

## 2017-11-09 DIAGNOSIS — D472 Monoclonal gammopathy: Secondary | ICD-10-CM | POA: Diagnosis not present

## 2017-11-09 DIAGNOSIS — I700 Atherosclerosis of aorta: Secondary | ICD-10-CM | POA: Insufficient documentation

## 2017-11-09 DIAGNOSIS — I2510 Atherosclerotic heart disease of native coronary artery without angina pectoris: Secondary | ICD-10-CM | POA: Diagnosis not present

## 2017-11-09 NOTE — Telephone Encounter (Signed)
I left a message for the patient that his bone survey was normal and did not show any evidence of myeloma.

## 2017-11-22 ENCOUNTER — Ambulatory Visit (INDEPENDENT_AMBULATORY_CARE_PROVIDER_SITE_OTHER): Payer: Medicare Other | Admitting: Emergency Medicine

## 2017-11-22 DIAGNOSIS — Z23 Encounter for immunization: Secondary | ICD-10-CM | POA: Diagnosis not present

## 2017-12-06 ENCOUNTER — Ambulatory Visit (INDEPENDENT_AMBULATORY_CARE_PROVIDER_SITE_OTHER): Payer: Medicare Other | Admitting: Internal Medicine

## 2017-12-06 ENCOUNTER — Encounter: Payer: Self-pay | Admitting: Internal Medicine

## 2017-12-06 VITALS — BP 154/82 | HR 76 | Temp 98.0°F | Ht 72.25 in | Wt 180.0 lb

## 2017-12-06 DIAGNOSIS — J309 Allergic rhinitis, unspecified: Secondary | ICD-10-CM | POA: Diagnosis not present

## 2017-12-06 DIAGNOSIS — H6982 Other specified disorders of Eustachian tube, left ear: Secondary | ICD-10-CM

## 2017-12-06 DIAGNOSIS — R42 Dizziness and giddiness: Secondary | ICD-10-CM

## 2017-12-06 DIAGNOSIS — I10 Essential (primary) hypertension: Secondary | ICD-10-CM

## 2017-12-06 MED ORDER — GLIMEPIRIDE 2 MG PO TABS: 2.0000 mg | ORAL_TABLET | Freq: Every day | ORAL | 3 refills | Status: DC

## 2017-12-06 MED ORDER — MECLIZINE HCL 12.5 MG PO TABS: 12.5000 mg | ORAL_TABLET | Freq: Three times a day (TID) | ORAL | 1 refills | Status: DC | PRN

## 2017-12-06 MED ORDER — TRIAMCINOLONE ACETONIDE 55 MCG/ACT NA AERO: 2.0000 | INHALATION_SPRAY | Freq: Every day | NASAL | 12 refills | Status: DC

## 2017-12-06 NOTE — Assessment & Plan Note (Signed)
For meclizine prn,  to f/u any worsening symptoms or concerns

## 2017-12-06 NOTE — Assessment & Plan Note (Signed)
For mucinex otc asd

## 2017-12-06 NOTE — Assessment & Plan Note (Signed)
Mild to mod, for zyrtec and nasacort asd,  to f/u any worsening symptoms or concerns

## 2017-12-06 NOTE — Assessment & Plan Note (Signed)
Mild elevated likely reactive, cont same tx

## 2017-12-06 NOTE — Progress Notes (Signed)
Subjective:    Patient ID: Nicholas Patton, male    DOB: 17-May-1934, 83 y.o.   MRN: [id number]  HPI Here to f/u; overall doing ok,  Pt denies chest pain, increasing sob or doe, wheezing, orthopnea, PND, increased LE swelling, palpitations, dizziness or syncope.  Pt denies new neurological symptoms such as new headache, or facial or extremity weakness or numbness.  Pt denies polydipsia, polyuria, or low sugar episode.  Pt states overall good compliance with meds, mostly trying to follow appropriate diet, with wt overall stable  Bp at home < 140/90  Does have several wks ongoing nasal allergy symptoms with clearish congestion, itch and sneezing, without fever, pain, ST, cough, swelling or wheezing, but does have left ear pressure, muffled hearing and vertigo intermittent as well.  No fever. Past Medical History:  Diagnosis Date  . Anemia   . Arthritis   . Cataract    REMOVED BILATERAL  . Colon polyps   . DIABETES MELLITUS, TYPE II    takes Metformin and Glimepiride daily  . Diverticulosis   . GERD (gastroesophageal reflux disease)    takes Protonix daily  . History of colon polyps   . HYPERLIPIDEMIA    takes Atorvastatin daily  . HYPERTENSION    takes Amlodipine and Hyzaar daily  . Ileus following gastrointestinal surgery ([place]) 07/22/2012  . Joint pain   . Peripheral neuropathy   . Peripheral neuropathy   . PERIPHERAL NEUROPATHY, FEET 10/06/2007  . VITAMIN B12 DEFICIENCY 09/06/2008  . VITAMIN D DEFICIENCY 01/16/2010   takes Vitamin D daily   Past Surgical History:  Procedure Laterality Date  . CATARACT EXTRACTION     both eyes  . CHOLECYSTECTOMY N/A 07/18/2012   Procedure: LAPAROSCOPIC CHOLECYSTECTOMY WITH INTRAOPERATIVE CHOLANGIOGRAM;  Surgeon: Zenovia Jarred, MD;  Location: [facility];  Service: General;  Laterality: N/A;  . COLONOSCOPY    . POLYPECTOMY    . POSTERIOR CERVICAL FUSION/FORAMINOTOMY N/A 06/27/2015   Procedure: Posterior Cervical Fusion with lateral mass fixation Cervical  four to cervical seven;  Surgeon: Eustace Moore, MD;  Location: [facility];  Service: Neurosurgery;  Laterality: N/A;  . TOTAL KNEE ARTHROPLASTY  2001   Left  . TURP VAPORIZATION      reports that he has quit smoking. His smoking use included cigarettes. He smoked 0.00 packs per day. He has never used smokeless tobacco. He reports that he does not drink alcohol or use drugs. family history includes Diabetes Mellitus II in his mother; Lung cancer in his brother. No Known Allergies Current Outpatient Medications on File Prior to Visit  Medication Sig Dispense Refill  . aspirin 81 MG EC tablet Take 81 mg by mouth daily.      Marland Kitchen atorvastatin (LIPITOR) 40 MG tablet TAKE 1 TABLET BY MOUTH  DAILY 90 tablet 1  . benzonatate (TESSALON PERLES) 100 MG capsule Take 1 capsule (100 mg total) by mouth 3 (three) times daily as needed for cough. 60 capsule 0  . Cholecalciferol (VITAMIN D3) 1000 UNITS CAPS Take 1 capsule (1,000 Units total) by mouth daily. 90 capsule 3  . ferrous sulfate 325 (65 FE) MG tablet Take 325 mg by mouth daily with breakfast.    . glucose blood (ONE TOUCH ULTRA TEST) test strip Use as instructed E11.9 200 each 12  . losartan (COZAAR) 100 MG tablet Take 1 tablet by mouth daily.  6  . Multiple Vitamins-Minerals (MULTIVITAMIN PO) Take 1 tablet by mouth daily.    . pantoprazole (PROTONIX)  40 MG tablet TAKE 1 TABLET BY MOUTH  DAILY 90 tablet 1  . tamsulosin (FLOMAX) 0.4 MG CAPS capsule TAKE 1 CAPSULE BY MOUTH  DAILY 90 capsule 1   No current facility-administered medications on file prior to visit.    Review of Systems  Constitutional: Negative for other unusual diaphoresis or sweats HENT: Negative for ear discharge or swelling Eyes: Negative for other worsening visual disturbances Respiratory: Negative for stridor or other swelling  Gastrointestinal: Negative for worsening distension or other blood Genitourinary: Negative for retention or other urinary change Musculoskeletal:  Negative for other MSK pain or swelling Skin: Negative for color change or other new lesions Neurological: Negative for worsening tremors and other numbness  Psychiatric/Behavioral: Negative for worsening agitation or other fatigue All other system neg per pt    Objective:   Physical Exam BP (!) 154/82   Pulse 76   Temp 98 F (36.7 C) (Oral)   Ht 6' 0.25" (1.835 m)   Wt 180 lb (81.6 kg)   SpO2 93%   BMI 24.24 kg/m  VS noted,  Constitutional: Pt appears in NAD HENT: Head: NCAT.  Right Ear: External ear normal.  Left Ear: External ear normal.  Left tm's with mild erythema and effusion.  Max sinus areas non tender.  Pharynx with mild erythema, no exudate Eyes: . Pupils are equal, round, and reactive to light. Conjunctivae and EOM are normal Nose: without d/c or deformity Neck: Neck supple. Gross normal ROM Cardiovascular: Normal rate and regular rhythm.   Pulmonary/Chest: Effort normal and breath sounds without rales or wheezing.  Neurological: Pt is alert. At baseline orientation, motor grossly intact Skin: Skin is warm. No rashes, other new lesions, no LE edema Psychiatric: Pt behavior is normal without agitation  No other exam findings    Assessment & Plan:

## 2017-12-06 NOTE — Patient Instructions (Signed)
Please take all new medication as prescribed - the nasacort for allergies and meclizine as needed for dizziness  Please continue all other medications as before, including the zyrtec  You can also take Mucinex (or it's generic off brand) for ear congestion, and tylenol as needed for pain.  Please have the pharmacy call with any other refills you may need.  Please keep your appointments with your specialists as you may have planned

## 2017-12-13 DIAGNOSIS — R972 Elevated prostate specific antigen [PSA]: Secondary | ICD-10-CM | POA: Diagnosis not present

## 2017-12-14 ENCOUNTER — Encounter: Payer: Self-pay | Admitting: Internal Medicine

## 2017-12-14 DIAGNOSIS — R202 Paresthesia of skin: Secondary | ICD-10-CM

## 2017-12-15 ENCOUNTER — Other Ambulatory Visit (INDEPENDENT_AMBULATORY_CARE_PROVIDER_SITE_OTHER): Payer: Medicare Other

## 2017-12-15 DIAGNOSIS — R202 Paresthesia of skin: Secondary | ICD-10-CM | POA: Diagnosis not present

## 2017-12-15 LAB — VITAMIN B12: Vitamin B-12: 289 pg/mL (ref 211–911)

## 2017-12-20 DIAGNOSIS — R351 Nocturia: Secondary | ICD-10-CM | POA: Diagnosis not present

## 2017-12-20 DIAGNOSIS — N401 Enlarged prostate with lower urinary tract symptoms: Secondary | ICD-10-CM | POA: Diagnosis not present

## 2017-12-20 DIAGNOSIS — R972 Elevated prostate specific antigen [PSA]: Secondary | ICD-10-CM | POA: Diagnosis not present

## 2017-12-27 ENCOUNTER — Ambulatory Visit (INDEPENDENT_AMBULATORY_CARE_PROVIDER_SITE_OTHER): Payer: Medicare Other | Admitting: Family

## 2017-12-27 ENCOUNTER — Encounter: Payer: Self-pay | Admitting: Family

## 2017-12-27 VITALS — BP 134/72 | HR 68 | Temp 97.8°F | Ht 72.25 in | Wt 182.0 lb

## 2017-12-27 DIAGNOSIS — J209 Acute bronchitis, unspecified: Secondary | ICD-10-CM | POA: Diagnosis not present

## 2017-12-27 DIAGNOSIS — N183 Chronic kidney disease, stage 3 (moderate): Secondary | ICD-10-CM | POA: Diagnosis not present

## 2017-12-27 MED ORDER — BENZONATATE 100 MG PO CAPS: 100.0000 mg | ORAL_CAPSULE | Freq: Three times a day (TID) | ORAL | 0 refills | Status: DC | PRN

## 2017-12-27 MED ORDER — AZITHROMYCIN 250 MG PO TABS: ORAL_TABLET | ORAL | 0 refills | Status: DC

## 2017-12-27 MED ORDER — GLIMEPIRIDE 2 MG PO TABS: 2.0000 mg | ORAL_TABLET | Freq: Every day | ORAL | 3 refills | Status: DC

## 2017-12-27 NOTE — Progress Notes (Signed)
Nicholas Patton is a 83 y.o. male with the following history as recorded in Epic[employer]:  Patient Active Problem List   Diagnosis Date Noted  . Eustachian tube dysfunction, left 12/06/2017  . MGUS (monoclonal gammopathy of unknown significance) 10/28/2017  . Elevated blood protein 10/08/2017  . Peripheral edema 09/17/2017  . Anemia, iron deficiency 04/10/2016  . Left-sided nosebleed 01/13/2016  . Hematochezia 11/21/2015  . Peripheral neuropathic pain 10/25/2015  . Low back pain 10/08/2015  . CKD (chronic kidney disease) stage 3, GFR 30-59 ml/min (HCC) 10/08/2015  . S/P cervical spinal fusion 06/27/2015  . Increased prostate specific antigen (PSA) velocity 04/09/2015  . Chest pain 08/14/2014  . Dyspnea 08/14/2014  . Urinary frequency 06/19/2014  . Overactive bladder 09/27/2013  . Onychomycosis 08/08/2013  . Pain in lower limb 05/15/2013  . Diabetic peripheral neuropathy ([place]) 05/15/2013  . Other hammer toe (acquired) 05/15/2013  . HAV (hallux abducto valgus) 05/15/2013  . Blood in mouth of unknown source 02/09/2013  . Vertigo 12/29/2012  . Acute upper respiratory infection 12/29/2012  . Erectile dysfunction 09/22/2012  . Left ear hearing loss 09/13/2012  . S/P laparoscopic cholecystectomy 08/03/2012  . Neck pain on left side 04/04/2012  . Trapezoid ligament sprain 04/04/2012  . Bladder neck obstruction 10/06/2011  . Cervical radiculitis 09/26/2010  . Preventative health care 09/14/2010  . VITAMIN D DEFICIENCY 01/16/2010  . PERS HX TOBACCO USE PRESENTING HAZARDS HEALTH 09/17/2009  . VITAMIN B12 DEFICIENCY 09/06/2008  . INSOMNIA 06/21/2008  . LEG PAIN, BILATERAL 10/06/2007  . Diabetes ([place]) 01/24/2007  . Hyperlipidemia 01/24/2007  . Allergic rhinitis 01/24/2007  . BPH (benign prostatic hypertrophy) 01/24/2007  . Essential hypertension 10/05/2006  . GERD 10/05/2006    Current Outpatient Medications  Medication Sig Dispense Refill  . aspirin 81 MG EC tablet Take 81 mg by  mouth daily.      Marland Kitchen atorvastatin (LIPITOR) 40 MG tablet TAKE 1 TABLET BY MOUTH  DAILY 90 tablet 1  . Cholecalciferol (VITAMIN D3) 1000 UNITS CAPS Take 1 capsule (1,000 Units total) by mouth daily. 90 capsule 3  . ferrous sulfate 325 (65 FE) MG tablet Take 325 mg by mouth daily with breakfast.    . glimepiride (AMARYL) 2 MG tablet Take 1 tablet (2 mg total) by mouth daily before breakfast. 90 tablet 3  . glucose blood (ONE TOUCH ULTRA TEST) test strip Use as instructed E11.9 200 each 12  . losartan (COZAAR) 100 MG tablet Take 1 tablet by mouth daily.  6  . meclizine (ANTIVERT) 12.5 MG tablet Take 1 tablet (12.5 mg total) by mouth 3 (three) times daily as needed for dizziness. 30 tablet 1  . Multiple Vitamins-Minerals (MULTIVITAMIN PO) Take 1 tablet by mouth daily.    . pantoprazole (PROTONIX) 40 MG tablet TAKE 1 TABLET BY MOUTH  DAILY 90 tablet 1  . tamsulosin (FLOMAX) 0.4 MG CAPS capsule TAKE 1 CAPSULE BY MOUTH  DAILY 90 capsule 1  . triamcinolone (NASACORT) 55 MCG/ACT AERO nasal inhaler Place 2 sprays into the nose daily. 1 Inhaler 12  . azithromycin (ZITHROMAX) 250 MG tablet 2 tabs po qd x 1 day; 1 tablet per day x 4 days; 6 tablet 0  . benzonatate (TESSALON) 100 MG capsule Take 1 capsule (100 mg total) by mouth 3 (three) times daily as needed. 20 capsule 0   No current facility-administered medications for this visit.     Allergies: Patient has no known allergies.  Past Medical History:  Diagnosis Date  . Anemia   .  Arthritis   . Cataract    REMOVED BILATERAL  . Colon polyps   . DIABETES MELLITUS, TYPE II    takes Metformin and Glimepiride daily  . Diverticulosis   . GERD (gastroesophageal reflux disease)    takes Protonix daily  . History of colon polyps   . HYPERLIPIDEMIA    takes Atorvastatin daily  . HYPERTENSION    takes Amlodipine and Hyzaar daily  . Ileus following gastrointestinal surgery ([place]) 07/22/2012  . Joint pain   . Peripheral neuropathy   . Peripheral  neuropathy   . PERIPHERAL NEUROPATHY, FEET 10/06/2007  . VITAMIN B12 DEFICIENCY 09/06/2008  . VITAMIN D DEFICIENCY 01/16/2010   takes Vitamin D daily    Past Surgical History:  Procedure Laterality Date  . CATARACT EXTRACTION     both eyes  . CHOLECYSTECTOMY N/A 07/18/2012   Procedure: LAPAROSCOPIC CHOLECYSTECTOMY WITH INTRAOPERATIVE CHOLANGIOGRAM;  Surgeon: Zenovia Jarred, MD;  Location: [facility];  Service: General;  Laterality: N/A;  . COLONOSCOPY    . POLYPECTOMY    . POSTERIOR CERVICAL FUSION/FORAMINOTOMY N/A 06/27/2015   Procedure: Posterior Cervical Fusion with lateral mass fixation Cervical four to cervical seven;  Surgeon: Eustace Moore, MD;  Location: [facility];  Service: Neurosurgery;  Laterality: N/A;  . TOTAL KNEE ARTHROPLASTY  2001   Left  . TURP VAPORIZATION      Family History  Problem Relation Age of Onset  . Diabetes Mellitus II Mother   . Lung cancer Brother   . Colon cancer Neg Hx   . Esophageal cancer Neg Hx   . Rectal cancer Neg Hx   . Stomach cancer Neg Hx     Social History   Tobacco Use  . Smoking status: Former Smoker    Packs/day: 0.00    Types: Cigarettes  . Smokeless tobacco: Never Used  . Tobacco comment: quit smoking around 1990  Substance Use Topics  . Alcohol use: No    Alcohol/week: 0.0 standard drinks    Subjective:  Patient presents with cough x 2 weeks; mostly dry cough; + coughing fits; prone to bronchitis; no fever, no chest pain, no shortness of breath; has tried Robitussin DM/ Tessalon perles with no relief.     Objective:  Vitals:   12/27/17 0905  BP: 134/72  Pulse: 68  Temp: 97.8 F (36.6 C)  TempSrc: Oral  SpO2: 95%  Weight: 182 lb (82.6 kg)  Height: 6' 0.25" (1.835 m)    General: Well developed, well nourished, in no acute distress  Skin : Warm and dry.  Head: Normocephalic and atraumatic  Eyes: Sclera and conjunctiva clear; pupils round and reactive to light; extraocular movements intact  Ears: External normal;  canals clear; tympanic membranes normal  Oropharynx: Pink, supple. No suspicious lesions  Neck: Supple without thyromegaly, adenopathy  Lungs: Respirations unlabored; clear to auscultation bilaterally without wheeze, rales, rhonchi  CVS exam: normal rate and regular rhythm.  Neurologic: Alert and oriented; speech intact; face symmetrical; moves all extremities well; CNII-XII intact without focal deficit  Assessment:   1. Acute bronchitis, unspecified organism     Plan:  Rx for Z-pak #1 take as directed; continue Tessalon Perles; increase fluids, rest and follow-up worse, no better.  Refill updated on his Glimepiride- he notes he did not receive Rx updated by his PCP at last OV.  No follow-ups on file.  No orders of the defined types were placed in this encounter.   Requested Prescriptions   Signed Prescriptions Disp Refills  .  glimepiride (AMARYL) 2 MG tablet 90 tablet 3    Sig: Take 1 tablet (2 mg total) by mouth daily before breakfast.  . azithromycin (ZITHROMAX) 250 MG tablet 6 tablet 0    Sig: 2 tabs po qd x 1 day; 1 tablet per day x 4 days;  . benzonatate (TESSALON) 100 MG capsule 20 capsule 0    Sig: Take 1 capsule (100 mg total) by mouth 3 (three) times daily as needed.

## 2018-01-06 ENCOUNTER — Encounter: Payer: Self-pay | Admitting: Internal Medicine

## 2018-01-06 DIAGNOSIS — N183 Chronic kidney disease, stage 3 (moderate): Secondary | ICD-10-CM | POA: Diagnosis not present

## 2018-01-06 DIAGNOSIS — D472 Monoclonal gammopathy: Secondary | ICD-10-CM | POA: Diagnosis not present

## 2018-01-06 DIAGNOSIS — D631 Anemia in chronic kidney disease: Secondary | ICD-10-CM | POA: Diagnosis not present

## 2018-01-06 DIAGNOSIS — I129 Hypertensive chronic kidney disease with stage 1 through stage 4 chronic kidney disease, or unspecified chronic kidney disease: Secondary | ICD-10-CM | POA: Diagnosis not present

## 2018-01-27 ENCOUNTER — Ambulatory Visit: Payer: Medicare Other | Admitting: Podiatry

## 2018-02-02 ENCOUNTER — Other Ambulatory Visit (INDEPENDENT_AMBULATORY_CARE_PROVIDER_SITE_OTHER): Payer: Medicare Other

## 2018-02-02 ENCOUNTER — Ambulatory Visit (INDEPENDENT_AMBULATORY_CARE_PROVIDER_SITE_OTHER): Payer: Medicare Other | Admitting: Internal Medicine

## 2018-02-02 ENCOUNTER — Encounter: Payer: Self-pay | Admitting: Internal Medicine

## 2018-02-02 VITALS — BP 142/70 | HR 56 | Temp 97.5°F | Resp 14 | Ht 72.25 in | Wt 185.0 lb

## 2018-02-02 DIAGNOSIS — M10372 Gout due to renal impairment, left ankle and foot: Secondary | ICD-10-CM

## 2018-02-02 LAB — COMPREHENSIVE METABOLIC PANEL
ALT: 17 U/L (ref 0–53)
AST: 21 U/L (ref 0–37)
Albumin: 4.4 g/dL (ref 3.5–5.2)
Alkaline Phosphatase: 98 U/L (ref 39–117)
BUN: 22 mg/dL (ref 6–23)
CO2: 28 mEq/L (ref 19–32)
Calcium: 10.1 mg/dL (ref 8.4–10.5)
Chloride: 106 mEq/L (ref 96–112)
Creatinine, Ser: 1.58 mg/dL — ABNORMAL HIGH (ref 0.40–1.50)
GFR: 54.33 mL/min — ABNORMAL LOW (ref >60.00)
Glucose, Bld: 123 mg/dL — ABNORMAL HIGH (ref 70–99)
Potassium: 4.3 mEq/L (ref 3.5–5.1)
Sodium: 142 mEq/L (ref 135–145)
Total Bilirubin: 0.4 mg/dL (ref 0.2–1.2)
Total Protein: 7.1 g/dL (ref 6.0–8.3)

## 2018-02-02 LAB — URIC ACID: Uric Acid, Serum: 6.8 mg/dL (ref 4.0–7.8)

## 2018-02-02 MED ORDER — COLCHICINE 0.6 MG PO TABS: ORAL_TABLET | ORAL | 0 refills | Status: DC

## 2018-02-02 MED ORDER — PREDNISONE 10 MG PO TABS: ORAL_TABLET | ORAL | 0 refills | Status: DC

## 2018-02-02 NOTE — Progress Notes (Signed)
Subjective:    Patient ID: Nicholas Patton, male    DOB: 08/08/1934, 83 y.o.   MRN: [id number]  HPI The patient is here for an acute visit.   Big toe pain:  His left big toe has been hurting a little for the past week or so and this monring it is severely painful.  It hurt last night just to have the sheet on it.  There was no iinjuries.  He has swelling and it is warm.  He denies any erythema.  He has not had any fevers or chills.  He has never had a previous episode similar to this.  He is a diabetic and his sugars are well controlled.  He does have chronic kidney disease.  He denies any changes in medication or diet recently.       Medications and allergies reviewed with patient and updated if appropriate.  Patient Active Problem List   Diagnosis Date Noted  . Eustachian tube dysfunction, left 12/06/2017  . MGUS (monoclonal gammopathy of unknown significance) 10/28/2017  . Elevated blood protein 10/08/2017  . Peripheral edema 09/17/2017  . Anemia, iron deficiency 04/10/2016  . Left-sided nosebleed 01/13/2016  . Hematochezia 11/21/2015  . Peripheral neuropathic pain 10/25/2015  . Low back pain 10/08/2015  . CKD (chronic kidney disease) stage 3, GFR 30-59 ml/min (HCC) 10/08/2015  . S/P cervical spinal fusion 06/27/2015  . Increased prostate specific antigen (PSA) velocity 04/09/2015  . Chest pain 08/14/2014  . Dyspnea 08/14/2014  . Urinary frequency 06/19/2014  . Overactive bladder 09/27/2013  . Onychomycosis 08/08/2013  . Pain in lower limb 05/15/2013  . Diabetic peripheral neuropathy ([place]) 05/15/2013  . Other hammer toe (acquired) 05/15/2013  . HAV (hallux abducto valgus) 05/15/2013  . Blood in mouth of unknown source 02/09/2013  . Vertigo 12/29/2012  . Acute upper respiratory infection 12/29/2012  . Erectile dysfunction 09/22/2012  . Left ear hearing loss 09/13/2012  . S/P laparoscopic cholecystectomy 08/03/2012  . Neck pain on left side 04/04/2012  . Trapezoid  ligament sprain 04/04/2012  . Bladder neck obstruction 10/06/2011  . Cervical radiculitis 09/26/2010  . Preventative health care 09/14/2010  . VITAMIN D DEFICIENCY 01/16/2010  . PERS HX TOBACCO USE PRESENTING HAZARDS HEALTH 09/17/2009  . VITAMIN B12 DEFICIENCY 09/06/2008  . INSOMNIA 06/21/2008  . LEG PAIN, BILATERAL 10/06/2007  . Diabetes ([place]) 01/24/2007  . Hyperlipidemia 01/24/2007  . Allergic rhinitis 01/24/2007  . BPH (benign prostatic hypertrophy) 01/24/2007  . Essential hypertension 10/05/2006  . GERD 10/05/2006    Current Outpatient Medications on File Prior to Visit  Medication Sig Dispense Refill  . aspirin 81 MG EC tablet Take 81 mg by mouth daily.      Marland Kitchen atorvastatin (LIPITOR) 40 MG tablet TAKE 1 TABLET BY MOUTH  DAILY 90 tablet 1  . Cholecalciferol (VITAMIN D3) 1000 UNITS CAPS Take 1 capsule (1,000 Units total) by mouth daily. 90 capsule 3  . ferrous sulfate 325 (65 FE) MG tablet Take 325 mg by mouth daily with breakfast.    . glimepiride (AMARYL) 2 MG tablet Take 1 tablet (2 mg total) by mouth daily before breakfast. 90 tablet 3  . glucose blood (ONE TOUCH ULTRA TEST) test strip Use as instructed E11.9 200 each 12  . losartan (COZAAR) 100 MG tablet Take 1 tablet by mouth daily.  6  . meclizine (ANTIVERT) 12.5 MG tablet Take 1 tablet (12.5 mg total) by mouth 3 (three) times daily as needed for dizziness. 30 tablet 1  .  Multiple Vitamins-Minerals (MULTIVITAMIN PO) Take 1 tablet by mouth daily.    . pantoprazole (PROTONIX) 40 MG tablet TAKE 1 TABLET BY MOUTH  DAILY 90 tablet 1  . tamsulosin (FLOMAX) 0.4 MG CAPS capsule TAKE 1 CAPSULE BY MOUTH  DAILY 90 capsule 1  . triamcinolone (NASACORT) 55 MCG/ACT AERO nasal inhaler Place 2 sprays into the nose daily. 1 Inhaler 12   No current facility-administered medications on file prior to visit.     Past Medical History:  Diagnosis Date  . Anemia   . Arthritis   . Cataract    REMOVED BILATERAL  . Colon polyps   . DIABETES  MELLITUS, TYPE II    takes Metformin and Glimepiride daily  . Diverticulosis   . GERD (gastroesophageal reflux disease)    takes Protonix daily  . History of colon polyps   . HYPERLIPIDEMIA    takes Atorvastatin daily  . HYPERTENSION    takes Amlodipine and Hyzaar daily  . Ileus following gastrointestinal surgery ([place]) 07/22/2012  . Joint pain   . Peripheral neuropathy   . Peripheral neuropathy   . PERIPHERAL NEUROPATHY, FEET 10/06/2007  . VITAMIN B12 DEFICIENCY 09/06/2008  . VITAMIN D DEFICIENCY 01/16/2010   takes Vitamin D daily    Past Surgical History:  Procedure Laterality Date  . CATARACT EXTRACTION     both eyes  . CHOLECYSTECTOMY N/A 07/18/2012   Procedure: LAPAROSCOPIC CHOLECYSTECTOMY WITH INTRAOPERATIVE CHOLANGIOGRAM;  Surgeon: Zenovia Jarred, MD;  Location: [facility];  Service: General;  Laterality: N/A;  . COLONOSCOPY    . POLYPECTOMY    . POSTERIOR CERVICAL FUSION/FORAMINOTOMY N/A 06/27/2015   Procedure: Posterior Cervical Fusion with lateral mass fixation Cervical four to cervical seven;  Surgeon: Eustace Moore, MD;  Location: [facility];  Service: Neurosurgery;  Laterality: N/A;  . TOTAL KNEE ARTHROPLASTY  2001   Left  . TURP VAPORIZATION      Social History   Socioeconomic History  . Marital status: Married    Spouse name: Not on file  . Number of children: Not on file  . Years of education: Not on file  . Highest education level: Not on file  Occupational History  . Not on file  Social Needs  . Financial resource strain: Not on file  . Food insecurity:    Worry: Not on file    Inability: Not on file  . Transportation needs:    Medical: Not on file    Non-medical: Not on file  Tobacco Use  . Smoking status: Former Smoker    Packs/day: 0.00    Types: Cigarettes  . Smokeless tobacco: Never Used  . Tobacco comment: quit smoking around 1990  Substance and Sexual Activity  . Alcohol use: No    Alcohol/week: 0.0 standard drinks  . Drug use: No  .  Sexual activity: Never  Lifestyle  . Physical activity:    Days per week: Not on file    Minutes per session: Not on file  . Stress: Not on file  Relationships  . Social connections:    Talks on phone: Not on file    Gets together: Not on file    Attends religious service: Not on file    Active member of club or organization: Not on file    Attends meetings of clubs or organizations: Not on file    Relationship status: Not on file  Other Topics Concern  . Not on file  Social History Narrative  . Not on  file    Family History  Problem Relation Age of Onset  . Diabetes Mellitus II Mother   . Lung cancer Brother   . Colon cancer Neg Hx   . Esophageal cancer Neg Hx   . Rectal cancer Neg Hx   . Stomach cancer Neg Hx     Review of Systems  Constitutional: Negative for chills and fever.  Musculoskeletal:       Left big toe pain and swelling  Skin: Negative for color change and wound.       Increased warmth left big toe       Objective:   Vitals:   02/02/18 0935  BP: (!) 142/70  Pulse: (!) 56  Resp: 14  Temp: (!) 97.5 F (36.4 C)  SpO2: 97%   BP Readings from Last 3 Encounters:  02/02/18 (!) 142/70  12/27/17 134/72  12/06/17 (!) 154/82   Wt Readings from Last 3 Encounters:  02/02/18 185 lb (83.9 kg)  12/27/17 182 lb (82.6 kg)  12/06/17 180 lb (81.6 kg)   Body mass index is 24.92 kg/m.   Physical Exam  Constitutional: He appears well-developed and well-nourished. No distress.  HENT:  Head: Normocephalic and atraumatic.  Musculoskeletal:  Left MTP joint with mild swelling, mild erythema, warmth and significant tenderness with light palpation.  Increased pain with active and passive movement of toe.  Sensation intact left toes  Skin: Skin is warm and dry. He is not diaphoretic.           Assessment & Plan:    See Problem List for Assessment and Plan of chronic medical problems.

## 2018-02-02 NOTE — Patient Instructions (Addendum)
Take the colchicine as directed.  Hopefully this will get rid of your toe pain.  If you still have severe pain tomorrow start the prednisone taper.  If you have any questions please call.      Gout Gout is painful swelling that can occur in some of your joints. Gout is a type of arthritis. This condition is caused by having too much uric acid in your body. Uric acid is a chemical that forms when your body breaks down substances called purines. Purines are important for building body proteins. When your body has too much uric acid, sharp crystals can form and build up inside your joints. This causes pain and swelling. Gout attacks can happen quickly and be very painful (acute gout). Over time, the attacks can affect more joints and become more frequent (chronic gout). Gout can also cause uric acid to build up under your skin and inside your kidneys. What are the causes? This condition is caused by too much uric acid in your blood. This can occur because:  Your kidneys do not remove enough uric acid from your blood. This is the most common cause.  Your body makes too much uric acid. This can occur with some cancers and cancer treatments. It can also occur if your body is breaking down too many red blood cells (hemolytic anemia).  You eat too many foods that are high in purines. These foods include organ meats and some seafood. Alcohol, especially beer, is also high in purines.  A gout attack may be triggered by trauma or stress. What increases the risk? This condition is more likely to develop in people who:  Have a family history of gout.  Are male and middle-aged.  Are male and have gone through menopause.  Are obese.  Frequently drink alcohol, especially beer.  Are dehydrated.  Lose weight too quickly.  Have an organ transplant.  Have lead poisoning.  Take certain medicines, including aspirin, cyclosporine, diuretics, levodopa, and niacin.  Have kidney disease or  psoriasis.  What are the signs or symptoms? An attack of acute gout happens quickly. It usually occurs in just one joint. The most common place is the big toe. Attacks often start at night. Other joints that may be affected include joints of the feet, ankle, knee, fingers, wrist, or elbow. Symptoms may include:  Severe pain.  Warmth.  Swelling.  Stiffness.  Tenderness. The affected joint may be very painful to touch.  Shiny, red, or purple skin.  Chills and fever.  Chronic gout may cause symptoms more frequently. More joints may be involved. You may also have white or yellow lumps (tophi) on your hands or feet or in other areas near your joints. How is this diagnosed? This condition is diagnosed based on your symptoms, medical history, and physical exam. You may have tests, such as:  Blood tests to measure uric acid levels.  Removal of joint fluid with a needle (aspiration) to look for uric acid crystals.  X-rays to look for joint damage.  How is this treated? Treatment for this condition has two phases: treating an acute attack and preventing future attacks. Acute gout treatment may include medicines to reduce pain and swelling, including:  NSAIDs.  Steroids. These are strong anti-inflammatory medicines that can be taken by mouth (orally) or injected into a joint.  Colchicine. This medicine relieves pain and swelling when it is taken soon after an attack. It can be given orally or through an IV tube.  Preventive treatment may  include:  Daily use of smaller doses of NSAIDs or colchicine.  Use of a medicine that reduces uric acid levels in your blood.  Changes to your diet. You may need to see a specialist about healthy eating (dietitian).  Follow these instructions at home: During a Gout Attack  If directed, apply ice to the affected area: ? Put ice in a plastic bag. ? Place a towel between your skin and the bag. ? Leave the ice on for 20 minutes, 2-3 times a  day.  Rest the joint as much as possible. If the affected joint is in your leg, you may be given crutches to use.  Raise (elevate) the affected joint above the level of your heart as often as possible.  Drink enough fluids to keep your urine clear or pale yellow.  Take over-the-counter and prescription medicines only as told by your health care provider.  Do not drive or operate heavy machinery while taking prescription pain medicine.  Follow instructions from your health care provider about eating or drinking restrictions.  Return to your normal activities as told by your health care provider. Ask your health care provider what activities are safe for you. Avoiding Future Gout Attacks  Follow a low-purine diet as told by your dietitian or health care provider. Avoid foods and drinks that are high in purines, including liver, kidney, anchovies, asparagus, herring, mushrooms, mussels, and beer.  Limit alcohol intake to no more than 1 drink a day for nonpregnant women and 2 drinks a day for men. One drink equals 12 oz of beer, 5 oz of wine, or 1 oz of hard liquor.  Maintain a healthy weight or lose weight if you are overweight. If you want to lose weight, talk with your health care provider. It is important that you do not lose weight too quickly.  Start or maintain an exercise program as told by your health care provider.  Drink enough fluids to keep your urine clear or pale yellow.  Take over-the-counter and prescription medicines only as told by your health care provider.  Keep all follow-up visits as told by your health care provider. This is important. Contact a health care provider if:  You have another gout attack.  You continue to have symptoms of a gout attack after10 days of treatment.  You have side effects from your medicines.  You have chills or a fever.  You have burning pain when you urinate.  You have pain in your lower back or belly. Get help right away  if:  You have severe or uncontrolled pain.  You cannot urinate. This information is not intended to replace advice given to you by your health care provider. Make sure you discuss any questions you have with your health care provider. Document Released: 02/21/2000 Document Revised: 08/01/2015 Document Reviewed: 12/06/2014 Elsevier Interactive Patient Education  [employer].

## 2018-02-02 NOTE — Assessment & Plan Note (Signed)
First episode of gout --left MTP joint Likely related to renal impairment Colchicine 1.2 mg x 1, 0.6 mg 1 hour later If this is not effective I have given him a prednisone taper to initiate given the holiday tomorrow-he will start this tomorrow if his pain is still severe He will call with any questions or concerns-he understands there is a doctor on call We will check CMP, uric acid level Should have uric acid level rechecked at his next visit Discussed that this can recur and if it does he may need to be placed on preventative medication or if his uric acid level is high in the future he should consider preventative medication Information given regarding gout He will call with any questions or concerns

## 2018-02-09 ENCOUNTER — Ambulatory Visit: Payer: Self-pay | Admitting: *Deleted

## 2018-02-09 NOTE — Telephone Encounter (Signed)
Ok to come for nurse visit for depomedrol 80 IM x 1 for acute gouty arthritis

## 2018-02-09 NOTE — Telephone Encounter (Signed)
"  Patient called to let doctor know that he is having a flare up of his gout in his big toe on the left foot. He saw Dr. Quay Burow on 02/02/18, but his PCP is Dr. [name]. Patient wanted to know if he should come in or can the doctor call in something for his pain".  Called patient back regarding his pain in his toe. He saw a provider in the office on 11/27 and was prescribed prednisone and colchicine. He has one more day left on the steroid.  He has denied having a fever. He wanted to know if something else could be call in for him. He is still having the pain esp when he is trying to walk or have to put his shoes on.  He has asked for his pcp to give him a call back regarding his request.  Advised him if the pain gets worst or increase in symptoms, to be seen at an urgent care. Pt voiced understanding.  Routing to flow at [facility] at [facility].  Reason for Disposition . Pain in the big toe joint  Answer Assessment - Initial Assessment Questions 1. ONSET: "When did the pain start?"      01/31/18 2. LOCATION: "Where is the pain located?"   (e.g., around nail, entire toe, at foot joint)      Left foot big toe, around the joint 3. PAIN: "How bad is the pain?"    (Scale 1-10; or mild, moderate, severe)   -  MILD (1-3): doesn't interfere with normal activities    -  MODERATE (4-7): interferes with normal activities (e.g., work or school) or awakens from sleep, limping    -  SEVERE (8-10): excruciating pain, unable to do any normal activities, unable to walk     Pain # 7/8  with shoe or walking 4. APPEARANCE: "What does the toe look like?" (e.g., redness, swelling, bruising, pallor)     Some swelling 5. CAUSE: "What do you think is causing the toe pain?"     gout 6. OTHER SYMPTOMS: "Do you have any other symptoms?" (e.g., leg pain, rash, fever, numbness)     no  Protocols used: TOE PAIN-A-AH

## 2018-02-10 NOTE — Telephone Encounter (Signed)
Left message for patient to call back to schedule.

## 2018-02-21 ENCOUNTER — Other Ambulatory Visit: Payer: Self-pay | Admitting: Internal Medicine

## 2018-03-02 ENCOUNTER — Encounter: Payer: Self-pay | Admitting: Internal Medicine

## 2018-03-02 DIAGNOSIS — M79673 Pain in unspecified foot: Secondary | ICD-10-CM

## 2018-03-06 ENCOUNTER — Encounter (HOSPITAL_COMMUNITY): Payer: Self-pay | Admitting: Emergency Medicine

## 2018-03-06 ENCOUNTER — Emergency Department (HOSPITAL_COMMUNITY)
Admission: EM | Admit: 2018-03-06 | Discharge: 2018-03-06 | Disposition: A | Discharge status: Home / Self Care | Payer: Medicare Other | Attending: Emergency Medicine | Admitting: Emergency Medicine

## 2018-03-06 DIAGNOSIS — B029 Zoster without complications: Secondary | ICD-10-CM | POA: Diagnosis not present

## 2018-03-06 DIAGNOSIS — E1122 Type 2 diabetes mellitus with diabetic chronic kidney disease: Secondary | ICD-10-CM | POA: Diagnosis not present

## 2018-03-06 DIAGNOSIS — N183 Chronic kidney disease, stage 3 (moderate): Secondary | ICD-10-CM | POA: Diagnosis not present

## 2018-03-06 DIAGNOSIS — Z7982 Long term (current) use of aspirin: Secondary | ICD-10-CM | POA: Insufficient documentation

## 2018-03-06 DIAGNOSIS — I129 Hypertensive chronic kidney disease with stage 1 through stage 4 chronic kidney disease, or unspecified chronic kidney disease: Secondary | ICD-10-CM | POA: Insufficient documentation

## 2018-03-06 DIAGNOSIS — Z7984 Long term (current) use of oral hypoglycemic drugs: Secondary | ICD-10-CM | POA: Diagnosis not present

## 2018-03-06 DIAGNOSIS — Z96652 Presence of left artificial knee joint: Secondary | ICD-10-CM | POA: Diagnosis not present

## 2018-03-06 DIAGNOSIS — Z79899 Other long term (current) drug therapy: Secondary | ICD-10-CM | POA: Insufficient documentation

## 2018-03-06 DIAGNOSIS — R21 Rash and other nonspecific skin eruption: Secondary | ICD-10-CM | POA: Diagnosis present

## 2018-03-06 DIAGNOSIS — Z87891 Personal history of nicotine dependence: Secondary | ICD-10-CM | POA: Insufficient documentation

## 2018-03-06 MED ORDER — GABAPENTIN 300 MG PO CAPS: ORAL_CAPSULE | ORAL | 0 refills | Status: DC

## 2018-03-06 MED ORDER — HYDROCODONE-ACETAMINOPHEN 5-325 MG PO TABS
2.0000 | ORAL_TABLET | Freq: Once | ORAL | Status: AC
  Administered 2018-03-06: 2 via ORAL
  Filled 2018-03-06: qty 2

## 2018-03-06 MED ORDER — VALACYCLOVIR HCL 1 G PO TABS: 1000.0000 mg | ORAL_TABLET | Freq: Three times a day (TID) | ORAL | 0 refills | Status: AC

## 2018-03-06 MED ORDER — HYDROCODONE-ACETAMINOPHEN 5-325 MG PO TABS: 1.0000 | ORAL_TABLET | Freq: Four times a day (QID) | ORAL | 0 refills | Status: DC | PRN

## 2018-03-06 MED ORDER — VALACYCLOVIR HCL 500 MG PO TABS
1000.0000 mg | ORAL_TABLET | Freq: Once | ORAL | Status: AC
  Administered 2018-03-06: 1000 mg via ORAL
  Filled 2018-03-06: qty 2

## 2018-03-06 NOTE — ED Notes (Signed)
Bed: [facility] Expected date:  Expected time:  Means of arrival:  Comments:

## 2018-03-06 NOTE — ED Triage Notes (Signed)
Pt reports that been having pains on right side for couple days and noticed rash today. Pt has drainage from them today.

## 2018-03-06 NOTE — Discharge Instructions (Signed)
Take valtrex three times daily for 10 days for shingles.   Expect more rash. It will start crusting over.   Take vicodin for severe pain.   Take gabapentin as prescribed for pain. Expect pain even after rash resolves   See your doctor next week   Return to ER if you have fever, vomiting, severe pain

## 2018-03-06 NOTE — ED Provider Notes (Signed)
[facility] DEPT Provider Note   CSN: [id number] Arrival date & time: 03/06/18  1829     History   Chief Complaint Chief Complaint  Patient presents with  . Herpes Zoster  . Rash    HPI Nicholas Patton is a 83 y.o. male hx of DM, GERD, HL, HTN, here presenting with possible shingles.  Patient states that he did have chickenpox as a kid.  He started having right flank pain since yesterday.  He does have stage III renal failure and he thought his kidneys were hurting.  He was watching the ball game today, and noticed a rash on the right flank area.  States that the rash is itchy and burning sensation.  Denies any fevers or chills or vomiting.  Patient never had shingles before.   The history is provided by the patient.    Past Medical History:  Diagnosis Date  . Anemia   . Arthritis   . Cataract    REMOVED BILATERAL  . Colon polyps   . DIABETES MELLITUS, TYPE II    takes Metformin and Glimepiride daily  . Diverticulosis   . GERD (gastroesophageal reflux disease)    takes Protonix daily  . History of colon polyps   . HYPERLIPIDEMIA    takes Atorvastatin daily  . HYPERTENSION    takes Amlodipine and Hyzaar daily  . Ileus following gastrointestinal surgery ([place]) 07/22/2012  . Joint pain   . Peripheral neuropathy   . Peripheral neuropathy   . PERIPHERAL NEUROPATHY, FEET 10/06/2007  . VITAMIN B12 DEFICIENCY 09/06/2008  . VITAMIN D DEFICIENCY 01/16/2010   takes Vitamin D daily    Patient Active Problem List   Diagnosis Date Noted  . Acute gout due to renal impairment involving toe of left foot 02/02/2018  . Eustachian tube dysfunction, left 12/06/2017  . MGUS (monoclonal gammopathy of unknown significance) 10/28/2017  . Elevated blood protein 10/08/2017  . Peripheral edema 09/17/2017  . Anemia, iron deficiency 04/10/2016  . Left-sided nosebleed 01/13/2016  . Hematochezia 11/21/2015  . Peripheral neuropathic pain 10/25/2015  . Low back  pain 10/08/2015  . CKD (chronic kidney disease) stage 3, GFR 30-59 ml/min (HCC) 10/08/2015  . S/P cervical spinal fusion 06/27/2015  . Increased prostate specific antigen (PSA) velocity 04/09/2015  . Chest pain 08/14/2014  . Dyspnea 08/14/2014  . Urinary frequency 06/19/2014  . Overactive bladder 09/27/2013  . Onychomycosis 08/08/2013  . Pain in lower limb 05/15/2013  . Diabetic peripheral neuropathy ([place]) 05/15/2013  . Other hammer toe (acquired) 05/15/2013  . HAV (hallux abducto valgus) 05/15/2013  . Blood in mouth of unknown source 02/09/2013  . Vertigo 12/29/2012  . Acute upper respiratory infection 12/29/2012  . Erectile dysfunction 09/22/2012  . Left ear hearing loss 09/13/2012  . S/P laparoscopic cholecystectomy 08/03/2012  . Neck pain on left side 04/04/2012  . Trapezoid ligament sprain 04/04/2012  . Bladder neck obstruction 10/06/2011  . Cervical radiculitis 09/26/2010  . Preventative health care 09/14/2010  . VITAMIN D DEFICIENCY 01/16/2010  . PERS HX TOBACCO USE PRESENTING HAZARDS HEALTH 09/17/2009  . VITAMIN B12 DEFICIENCY 09/06/2008  . INSOMNIA 06/21/2008  . LEG PAIN, BILATERAL 10/06/2007  . Diabetes ([place]) 01/24/2007  . Hyperlipidemia 01/24/2007  . Allergic rhinitis 01/24/2007  . BPH (benign prostatic hypertrophy) 01/24/2007  . Essential hypertension 10/05/2006  . GERD 10/05/2006    Past Surgical History:  Procedure Laterality Date  . CATARACT EXTRACTION     both eyes  . CHOLECYSTECTOMY N/A 07/18/2012  Procedure: LAPAROSCOPIC CHOLECYSTECTOMY WITH INTRAOPERATIVE CHOLANGIOGRAM;  Surgeon: Zenovia Jarred, MD;  Location: [facility];  Service: General;  Laterality: N/A;  . COLONOSCOPY    . POLYPECTOMY    . POSTERIOR CERVICAL FUSION/FORAMINOTOMY N/A 06/27/2015   Procedure: Posterior Cervical Fusion with lateral mass fixation Cervical four to cervical seven;  Surgeon: Eustace Moore, MD;  Location: [facility];  Service: Neurosurgery;  Laterality: N/A;  . TOTAL KNEE  ARTHROPLASTY  2001   Left  . TURP VAPORIZATION          Home Medications    Prior to Admission medications   Medication Sig Start Date End Date Taking? Authorizing Provider  aspirin 81 MG EC tablet Take 81 mg by mouth daily.      [provider]  atorvastatin (LIPITOR) 40 MG tablet TAKE 1 TABLET BY MOUTH  DAILY 02/21/18   [name], MD  Cholecalciferol (VITAMIN D3) 1000 UNITS CAPS Take 1 capsule (1,000 Units total) by mouth daily. 03/22/13   [name], MD  colchicine 0.6 MG tablet Take 2 tabs and then one hour later take 02/02/18   Binnie Rail, MD  ferrous sulfate 325 (65 FE) MG tablet Take 325 mg by mouth daily with breakfast.    [provider]  glimepiride (AMARYL) 2 MG tablet Take 1 tablet (2 mg total) by mouth daily before breakfast. 12/27/17   Marrian Salvage, FNP  glucose blood (ONE TOUCH ULTRA TEST) test strip Use as instructed E11.9 09/17/17   [name], MD  losartan (COZAAR) 100 MG tablet Take 1 tablet by mouth daily. 08/04/17   [provider]  meclizine (ANTIVERT) 12.5 MG tablet Take 1 tablet (12.5 mg total) by mouth 3 (three) times daily as needed for dizziness. 12/06/17 12/06/18  [name], MD  Multiple Vitamins-Minerals (MULTIVITAMIN PO) Take 1 tablet by mouth daily.    [provider]  pantoprazole (PROTONIX) 40 MG tablet TAKE 1 TABLET BY MOUTH  DAILY 02/21/18   [name], MD  predniSONE (DELTASONE) 10 MG tablet 3 tabs po qd x 3 days, then 2 tabs po qd x 3 days, then 1 tab po qd x 3 days 02/02/18   Binnie Rail, MD  tamsulosin (FLOMAX) 0.4 MG CAPS capsule TAKE 1 CAPSULE BY MOUTH  DAILY 02/21/18   [name], MD  triamcinolone (NASACORT) 55 MCG/ACT AERO nasal inhaler Place 2 sprays into the nose daily. 12/06/17   [name], MD    Family History Family History  Problem Relation Age of Onset  . Diabetes Mellitus II Mother   . Lung cancer Brother   . Colon cancer Neg Hx   . Esophageal cancer Neg Hx   .  Rectal cancer Neg Hx   . Stomach cancer Neg Hx     Social History Social History   Tobacco Use  . Smoking status: Former Smoker    Packs/day: 0.00    Types: Cigarettes  . Smokeless tobacco: Never Used  . Tobacco comment: quit smoking around 1990  Substance Use Topics  . Alcohol use: No    Alcohol/week: 0.0 standard drinks  . Drug use: No     Allergies   Patient has no known allergies.   Review of Systems Review of Systems  Skin: Positive for rash.  All other systems reviewed and are negative.    Physical Exam Updated Vital Signs BP (!) 168/64 (BP Location: Left Arm)   Pulse 70   Temp  98.5 F (36.9 C) (Oral)   Resp 18   SpO2 98%   Physical Exam Vitals signs and nursing note reviewed.  Constitutional:      Appearance: Normal appearance.     Comments: Slightly uncomfortable   HENT:     Head: Normocephalic.     Mouth/Throat:     Mouth: Mucous membranes are moist.  Eyes:     Extraocular Movements: Extraocular movements intact.     Pupils: Pupils are equal, round, and reactive to light.  Neck:     Musculoskeletal: Normal range of motion.  Cardiovascular:     Rate and Rhythm: Normal rate.  Pulmonary:     Effort: Pulmonary effort is normal.     Breath sounds: Normal breath sounds.  Abdominal:     General: Abdomen is flat.     Palpations: Abdomen is soft.  Musculoskeletal: Normal range of motion.  Skin:    Capillary Refill: Capillary refill takes less than 2 seconds.     Comments: Vesicular rash T10 dermatome. Most of the rash is at the R flank area. No incrustation yet. There is some rash at the RLQ as well. No abdominal tenderness   Neurological:     General: No focal deficit present.     Mental Status: He is alert.  Psychiatric:        Mood and Affect: Mood normal.        Behavior: Behavior normal.      ED Treatments / Results  Labs (all labs ordered are listed, but only abnormal results are displayed) Labs Reviewed - No data to  display  EKG None  Radiology No results found.  Procedures Procedures (including critical care time)  Medications Ordered in ED Medications  HYDROcodone-acetaminophen (NORCO/VICODIN) 5-325 MG per tablet 2 tablet (2 tablets Oral Given 03/06/18 1904)  valACYclovir (VALTREX) tablet 1,000 mg (1,000 mg Oral Given 03/06/18 1904)     Initial Impression / Assessment and Plan / ED Course  I have reviewed the triage vital signs and the nursing notes.  Pertinent labs & imaging results that were available during my care of the patient were reviewed by me and considered in my medical decision making (see chart for details).    Nicholas Patton is a 83 y.o. male here with rash. He has chicken pox before. I think likely shingles. Well appearing, afebrile. Will dc home with valtrex, pain meds, gabapentin. Gave strict return precautions.    Final Clinical Impressions(s) / ED Diagnoses   Final diagnoses:  None    ED Discharge Orders    None       Drenda Freeze, MD 03/06/18 1910

## 2018-03-11 ENCOUNTER — Encounter: Payer: Self-pay | Admitting: Internal Medicine

## 2018-03-11 ENCOUNTER — Telehealth: Payer: Self-pay | Admitting: Internal Medicine

## 2018-03-11 ENCOUNTER — Ambulatory Visit (INDEPENDENT_AMBULATORY_CARE_PROVIDER_SITE_OTHER): Payer: Medicare Other | Admitting: Internal Medicine

## 2018-03-11 DIAGNOSIS — I10 Essential (primary) hypertension: Secondary | ICD-10-CM | POA: Diagnosis not present

## 2018-03-11 DIAGNOSIS — N183 Chronic kidney disease, stage 3 unspecified: Secondary | ICD-10-CM

## 2018-03-11 DIAGNOSIS — B029 Zoster without complications: Secondary | ICD-10-CM | POA: Diagnosis not present

## 2018-03-11 MED ORDER — LIDOCAINE 5 % EX PTCH: 1.0000 | MEDICATED_PATCH | CUTANEOUS | 1 refills | Status: DC

## 2018-03-11 MED ORDER — GABAPENTIN 300 MG PO CAPS: 300.0000 mg | ORAL_CAPSULE | Freq: Three times a day (TID) | ORAL | 2 refills | Status: DC

## 2018-03-11 MED ORDER — TRAMADOL HCL 50 MG PO TABS: 50.0000 mg | ORAL_TABLET | Freq: Four times a day (QID) | ORAL | 0 refills | Status: DC | PRN

## 2018-03-11 NOTE — Assessment & Plan Note (Signed)
stable overall by history and exam, recent data reviewed with pt, and pt to continue medical treatment as before,  to f/u any worsening symptoms or concerns  

## 2018-03-11 NOTE — Patient Instructions (Signed)
Ok to stop the hydrocodone as you have done  Please take all new medication as prescribed - the tramadol as needed  Please call if you change your mind about the lidocaine pain patch  Please continue all other medications as before, including the antibiotic (valtrex) and the gabapentin for pain (we sent a refill)  Please have the pharmacy call with any other refills you may need.  Please keep your appointments with your specialists as you may have planned

## 2018-03-11 NOTE — Assessment & Plan Note (Addendum)
Pain uncontrolled overall mostly due to not wanting to take the hydrocodone, declines lidoderm patch, ok to cont the valtrex and gabapentin, add tramadol prn, natural hx of disease d/w pt, to consider shingles vaccination after resolved

## 2018-03-11 NOTE — Progress Notes (Signed)
Subjective:    Patient ID: Nicholas Patton, male    DOB: Sep 14, 1934, 84 y.o.   MRN: [id number]  HPI   Here to f/u recent ED visit with right flank area pain with rash, with the pain starting onset last Saturday, then seen in ED sun evening with onset of the rash.  Dx with acute shingles, tx with hydrocodone, gabapentin, and valtrex.  He does not want to take the hydrocodone about concern for side effects, and not sure if gabapentin has helped the neuritic pain.  Pt denies chest pain, increased sob or doe, wheezing, orthopnea, PND, increased LE swelling, palpitations, dizziness or syncope.  Pt denies new neurological symptoms such as new headache, or facial or extremity weakness or numbness   Pt denies fever, wt loss, night sweats, loss of appetite, or other constitutional symptoms   Pt denies polydipsia, polyuria Past Medical History:  Diagnosis Date  . Anemia   . Arthritis   . Cataract    REMOVED BILATERAL  . Colon polyps   . DIABETES MELLITUS, TYPE II    takes Metformin and Glimepiride daily  . Diverticulosis   . GERD (gastroesophageal reflux disease)    takes Protonix daily  . History of colon polyps   . HYPERLIPIDEMIA    takes Atorvastatin daily  . HYPERTENSION    takes Amlodipine and Hyzaar daily  . Ileus following gastrointestinal surgery ([place]) 07/22/2012  . Joint pain   . Peripheral neuropathy   . Peripheral neuropathy   . PERIPHERAL NEUROPATHY, FEET 10/06/2007  . VITAMIN B12 DEFICIENCY 09/06/2008  . VITAMIN D DEFICIENCY 01/16/2010   takes Vitamin D daily   Past Surgical History:  Procedure Laterality Date  . CATARACT EXTRACTION     both eyes  . CHOLECYSTECTOMY N/A 07/18/2012   Procedure: LAPAROSCOPIC CHOLECYSTECTOMY WITH INTRAOPERATIVE CHOLANGIOGRAM;  Surgeon: Zenovia Jarred, MD;  Location: [facility];  Service: General;  Laterality: N/A;  . COLONOSCOPY    . POLYPECTOMY    . POSTERIOR CERVICAL FUSION/FORAMINOTOMY N/A 06/27/2015   Procedure: Posterior Cervical Fusion with  lateral mass fixation Cervical four to cervical seven;  Surgeon: Eustace Moore, MD;  Location: [facility];  Service: Neurosurgery;  Laterality: N/A;  . TOTAL KNEE ARTHROPLASTY  2001   Left  . TURP VAPORIZATION      reports that he has quit smoking. His smoking use included cigarettes. He smoked 0.00 packs per day. He has never used smokeless tobacco. He reports that he does not drink alcohol or use drugs. family history includes Diabetes Mellitus II in his mother; Lung cancer in his brother. No Known Allergies Current Outpatient Medications on File Prior to Visit  Medication Sig Dispense Refill  . aspirin 81 MG EC tablet Take 81 mg by mouth daily.      Marland Kitchen atorvastatin (LIPITOR) 40 MG tablet TAKE 1 TABLET BY MOUTH  DAILY 90 tablet 1  . Cholecalciferol (VITAMIN D3) 1000 UNITS CAPS Take 1 capsule (1,000 Units total) by mouth daily. 90 capsule 3  . colchicine 0.6 MG tablet Take 2 tabs and then one hour later take 3 tablet 0  . ferrous sulfate 325 (65 FE) MG tablet Take 325 mg by mouth daily with breakfast.    . glimepiride (AMARYL) 2 MG tablet Take 1 tablet (2 mg total) by mouth daily before breakfast. 90 tablet 3  . glucose blood (ONE TOUCH ULTRA TEST) test strip Use as instructed E11.9 200 each 12  . losartan (COZAAR) 100 MG tablet Take 1  tablet by mouth daily.  6  . meclizine (ANTIVERT) 12.5 MG tablet Take 1 tablet (12.5 mg total) by mouth 3 (three) times daily as needed for dizziness. 30 tablet 1  . Multiple Vitamins-Minerals (MULTIVITAMIN PO) Take 1 tablet by mouth daily.    . pantoprazole (PROTONIX) 40 MG tablet TAKE 1 TABLET BY MOUTH  DAILY 90 tablet 1  . predniSONE (DELTASONE) 10 MG tablet 3 tabs po qd x 3 days, then 2 tabs po qd x 3 days, then 1 tab po qd x 3 days 18 tablet 0  . tamsulosin (FLOMAX) 0.4 MG CAPS capsule TAKE 1 CAPSULE BY MOUTH  DAILY 90 capsule 1  . triamcinolone (NASACORT) 55 MCG/ACT AERO nasal inhaler Place 2 sprays into the nose daily. 1 Inhaler 12  . valACYclovir  (VALTREX) 1000 MG tablet Take 1 tablet (1,000 mg total) by mouth 3 (three) times daily for 10 days. 30 tablet 0   No current facility-administered medications on file prior to visit.    Review of Systems  Constitutional: Negative for other unusual diaphoresis or sweats HENT: Negative for ear discharge or swelling Eyes: Negative for other worsening visual disturbances Respiratory: Negative for stridor or other swelling  Gastrointestinal: Negative for worsening distension or other blood Genitourinary: Negative for retention or other urinary change Musculoskeletal: Negative for other MSK pain or swelling Skin: Negative for color change or other new lesions Neurological: Negative for worsening tremors and other numbness  Psychiatric/Behavioral: Negative for worsening agitation or other fatigue All other system neg per pt    Objective:   Physical Exam BP 132/84   Pulse 88   Temp 97.7 F (36.5 C) (Oral)   Ht 6' 0.25" (1.835 m)   Wt 183 lb (83 kg)   SpO2 94%   BMI 24.65 kg/m  VS noted, not ill appearing but uncomfortable Constitutional: Pt appears in NAD HENT: Head: NCAT.  Right Ear: External ear normal.  Left Ear: External ear normal.  Eyes: . Pupils are equal, round, and reactive to light. Conjunctivae and EOM are normal Nose: without d/c or deformity Neck: Neck supple. Gross normal ROM Cardiovascular: Normal rate and regular rhythm.   Pulmonary/Chest: Effort normal and breath sounds without rales or wheezing.  Abd:  Soft, NT, ND, + BS, no organomegaly Neurological: Pt is alert. At baseline orientation, motor grossly intact Skin: Skin is warm. + typical shingles lesions c/w grouped vesicles on erythem base large area right flank, no LE edema Psychiatric: Pt behavior is normal without agitation  No other exam findings Lab Results  Component Value Date   WBC 5.2 05/18/2017   HGB 13.1 05/18/2017   HCT 39.5 05/18/2017   PLT 195.0 05/18/2017   GLUCOSE 123 (H) 02/02/2018   CHOL  121 10/08/2017   TRIG 60.0 10/08/2017   HDL 56.90 10/08/2017   LDLCALC 52 10/08/2017   ALT 17 02/02/2018   AST 21 02/02/2018   NA 142 02/02/2018   K 4.3 02/02/2018   CL 106 02/02/2018   CREATININE 1.58 (H) 02/02/2018   BUN 22 02/02/2018   CO2 28 02/02/2018   TSH 3.84 04/09/2017   PSA 4.61 (H) 04/09/2017   INR 1.12 06/19/2015   HGBA1C 6.7 (H) 10/08/2017   MICROALBUR <0.7 04/09/2017       Assessment & Plan:

## 2018-03-11 NOTE — Telephone Encounter (Signed)
Ok, this is done 

## 2018-03-11 NOTE — Assessment & Plan Note (Signed)
stable overall by history and exam, recent data reviewed with pt, and pt to continue medical treatment as before,  to f/u any worsening symptoms or concerns

## 2018-03-11 NOTE — Addendum Note (Signed)
Addended by: [name] on: 03/11/2018 03:33 PM   Modules accepted: Orders

## 2018-03-11 NOTE — Telephone Encounter (Signed)
Copied from [facility] [id number]. Topic: Quick Communication - Rx Refill/Question >> Mar 11, 2018  2:35 PM Margot Ables wrote: Medication: lidocaine pain patch - pt changed his mind and would like RX sent in for pain patches  Preferred Pharmacy (with phone number or street name): [facility], [place] - [address] [phone] (Phone) [phone] (Fax)

## 2018-03-15 ENCOUNTER — Encounter: Payer: Self-pay | Admitting: Internal Medicine

## 2018-03-16 ENCOUNTER — Ambulatory Visit: Payer: Medicare Other | Admitting: Internal Medicine

## 2018-03-16 NOTE — Telephone Encounter (Signed)
Ok to let pt know, ok to take 600 mg gabapentin (2 of the 300s) to see iff this helps the pain, and let me know if he needs a refill for this when starts to run out;  He should watch for sleepiness or dizziness but should be ok if he has been taking the 300 mg tid as per last visit

## 2018-03-28 ENCOUNTER — Encounter: Payer: Self-pay | Admitting: Internal Medicine

## 2018-03-29 MED ORDER — LIDOCAINE 5 % EX PTCH: 1.0000 | MEDICATED_PATCH | CUTANEOUS | 1 refills | Status: DC

## 2018-04-04 ENCOUNTER — Other Ambulatory Visit (INDEPENDENT_AMBULATORY_CARE_PROVIDER_SITE_OTHER): Payer: Medicare Other

## 2018-04-04 ENCOUNTER — Other Ambulatory Visit: Payer: Self-pay | Admitting: Internal Medicine

## 2018-04-04 ENCOUNTER — Ambulatory Visit (INDEPENDENT_AMBULATORY_CARE_PROVIDER_SITE_OTHER): Payer: Medicare Other | Admitting: Internal Medicine

## 2018-04-04 ENCOUNTER — Encounter: Payer: Self-pay | Admitting: Internal Medicine

## 2018-04-04 VITALS — BP 144/88 | HR 89 | Temp 97.9°F | Ht 72.25 in | Wt 183.0 lb

## 2018-04-04 DIAGNOSIS — N183 Chronic kidney disease, stage 3 unspecified: Secondary | ICD-10-CM

## 2018-04-04 DIAGNOSIS — B0229 Other postherpetic nervous system involvement: Secondary | ICD-10-CM

## 2018-04-04 DIAGNOSIS — E785 Hyperlipidemia, unspecified: Secondary | ICD-10-CM

## 2018-04-04 DIAGNOSIS — E1140 Type 2 diabetes mellitus with diabetic neuropathy, unspecified: Secondary | ICD-10-CM

## 2018-04-04 LAB — HEPATIC FUNCTION PANEL
ALT: 24 U/L (ref 0–53)
AST: 21 U/L (ref 0–37)
Albumin: 4.3 g/dL (ref 3.5–5.2)
Alkaline Phosphatase: 117 U/L (ref 39–117)
Bilirubin, Direct: 0.2 mg/dL (ref 0.0–0.3)
Total Bilirubin: 0.5 mg/dL (ref 0.2–1.2)
Total Protein: 7.2 g/dL (ref 6.0–8.3)

## 2018-04-04 LAB — CBC WITH DIFFERENTIAL/PLATELET
Basophils Absolute: 0.1 10*3/uL (ref 0.0–0.1)
Basophils Relative: 1.3 % (ref 0.0–3.0)
Eosinophils Absolute: 0.2 10*3/uL (ref 0.0–0.7)
Eosinophils Relative: 3.4 % (ref 0.0–5.0)
HCT: 43.1 % (ref 39.0–52.0)
Hemoglobin: 14.1 g/dL (ref 13.0–17.0)
Lymphocytes Relative: 24.2 % (ref 12.0–46.0)
Lymphs Abs: 1.1 10*3/uL (ref 0.7–4.0)
MCHC: 32.8 g/dL (ref 30.0–36.0)
MCV: 90.2 fl (ref 78.0–100.0)
Monocytes Absolute: 0.6 10*3/uL (ref 0.1–1.0)
Monocytes Relative: 12.1 % — ABNORMAL HIGH (ref 3.0–12.0)
Neutro Abs: 2.8 10*3/uL (ref 1.4–7.7)
Neutrophils Relative %: 59 % (ref 43.0–77.0)
Platelets: 202 10*3/uL (ref 150.0–400.0)
RBC: 4.78 Mil/uL (ref 4.22–5.81)
RDW: 14.8 % (ref 11.5–15.5)
WBC: 4.7 10*3/uL (ref 4.0–10.5)

## 2018-04-04 LAB — BASIC METABOLIC PANEL
BUN: 29 mg/dL — ABNORMAL HIGH (ref 6–23)
CO2: 27 mEq/L (ref 19–32)
Calcium: 10.6 mg/dL — ABNORMAL HIGH (ref 8.4–10.5)
Chloride: 102 mEq/L (ref 96–112)
Creatinine, Ser: 1.99 mg/dL — ABNORMAL HIGH (ref 0.40–1.50)
GFR: 39.15 mL/min — ABNORMAL LOW (ref >60.00)
Glucose, Bld: 89 mg/dL (ref 70–99)
Potassium: 4.3 mEq/L (ref 3.5–5.1)
Sodium: 139 mEq/L (ref 135–145)

## 2018-04-04 LAB — URINALYSIS, ROUTINE W REFLEX MICROSCOPIC
Bilirubin Urine: NEGATIVE
Hgb urine dipstick: NEGATIVE
Ketones, ur: NEGATIVE
Leukocytes, UA: NEGATIVE
Nitrite: NEGATIVE
RBC / HPF: NONE SEEN (ref 0–?)
Specific Gravity, Urine: <=1.005 — AB (ref 1.000–1.030)
Total Protein, Urine: NEGATIVE
Urine Glucose: NEGATIVE
Urobilinogen, UA: 0.2 (ref 0.0–1.0)
pH: 6 (ref 5.0–8.0)

## 2018-04-04 LAB — LIPID PANEL
Cholesterol: 117 mg/dL (ref 0–200)
HDL: 50.2 mg/dL (ref >39.00)
LDL Cholesterol: 53 mg/dL (ref 0–99)
NonHDL: 66.38
Total CHOL/HDL Ratio: 2
Triglycerides: 69 mg/dL (ref 0.0–149.0)
VLDL: 13.8 mg/dL (ref 0.0–40.0)

## 2018-04-04 LAB — MICROALBUMIN / CREATININE URINE RATIO
Creatinine,U: 54.3 mg/dL
Microalb Creat Ratio: 1.3 mg/g (ref 0.0–30.0)
Microalb, Ur: <0.7 mg/dL (ref 0.0–1.9)

## 2018-04-04 LAB — HEMOGLOBIN A1C: Hgb A1c MFr Bld: 7 % — ABNORMAL HIGH (ref 4.6–6.5)

## 2018-04-04 LAB — TSH: TSH: 5.59 u[IU]/mL — ABNORMAL HIGH (ref 0.35–4.50)

## 2018-04-04 MED ORDER — HYDROCODONE-ACETAMINOPHEN 5-325 MG PO TABS: 1.0000 | ORAL_TABLET | Freq: Four times a day (QID) | ORAL | 0 refills | Status: DC | PRN

## 2018-04-04 MED ORDER — ZOSTER VAC RECOMB ADJUVANTED 50 MCG/0.5ML IM SUSR: 0.5000 mL | Freq: Once | INTRAMUSCULAR | 1 refills | Status: AC

## 2018-04-04 MED ORDER — DULOXETINE HCL 30 MG PO CPEP: 30.0000 mg | ORAL_CAPSULE | Freq: Every day | ORAL | 11 refills | Status: DC

## 2018-04-04 MED ORDER — LIDOCAINE 5 % EX PTCH: 1.0000 | MEDICATED_PATCH | CUTANEOUS | 1 refills | Status: DC

## 2018-04-04 NOTE — Assessment & Plan Note (Signed)
stable overall by history and exam, recent data reviewed with pt, and pt to continue medical treatment as before,  to f/u any worsening symptoms or concerns

## 2018-04-04 NOTE — Assessment & Plan Note (Signed)
stable overall by history and exam, recent data reviewed with pt, and pt to continue medical treatment as before,  to f/u any worsening symptoms or concerns, for f/u lab today, consider renal referral for any worsening

## 2018-04-04 NOTE — Progress Notes (Signed)
Subjective:    Patient ID: Nicholas Patton, male    DOB: Mar 05, 1935, 84 y.o.   MRN: [id number]  HPI  Here to f/u with c/o persistent pain along the dermatome from the recent shingles outbreak rash;  Rash is resolved, but skin area remains mod to severe painful and dysesthetic about 8/10 severity, constant, persistent x > 3 wks; Tramadol and gabapentin no help, lidoderm too expensive and otc patches for pain did not help.  Also still has a fatty vs bursitis like swelling to the tip of the right elbow, no change but persists, denies red, tender, or drainage or recent trauma.  Pt denies chest pain, increased sob or doe, wheezing, orthopnea, PND, increased LE swelling, palpitations, dizziness or syncope.   Pt denies polydipsia, polyuria.  Pt denies new neurological symptoms such as new headache, or facial or extremity weakness Past Medical History:  Diagnosis Date  . Anemia   . Arthritis   . Cataract    REMOVED BILATERAL  . Colon polyps   . DIABETES MELLITUS, TYPE II    takes Metformin and Glimepiride daily  . Diverticulosis   . GERD (gastroesophageal reflux disease)    takes Protonix daily  . History of colon polyps   . HYPERLIPIDEMIA    takes Atorvastatin daily  . HYPERTENSION    takes Amlodipine and Hyzaar daily  . Ileus following gastrointestinal surgery ([place]) 07/22/2012  . Joint pain   . Peripheral neuropathy   . Peripheral neuropathy   . PERIPHERAL NEUROPATHY, FEET 10/06/2007  . VITAMIN B12 DEFICIENCY 09/06/2008  . VITAMIN D DEFICIENCY 01/16/2010   takes Vitamin D daily   Past Surgical History:  Procedure Laterality Date  . CATARACT EXTRACTION     both eyes  . CHOLECYSTECTOMY N/A 07/18/2012   Procedure: LAPAROSCOPIC CHOLECYSTECTOMY WITH INTRAOPERATIVE CHOLANGIOGRAM;  Surgeon: Zenovia Jarred, MD;  Location: [facility];  Service: General;  Laterality: N/A;  . COLONOSCOPY    . POLYPECTOMY    . POSTERIOR CERVICAL FUSION/FORAMINOTOMY N/A 06/27/2015   Procedure: Posterior Cervical Fusion  with lateral mass fixation Cervical four to cervical seven;  Surgeon: Eustace Moore, MD;  Location: [facility];  Service: Neurosurgery;  Laterality: N/A;  . TOTAL KNEE ARTHROPLASTY  2001   Left  . TURP VAPORIZATION      reports that he has quit smoking. His smoking use included cigarettes. He smoked 0.00 packs per day. He has never used smokeless tobacco. He reports that he does not drink alcohol or use drugs. family history includes Diabetes Mellitus II in his mother; Lung cancer in his brother. No Known Allergies Current Outpatient Medications on File Prior to Visit  Medication Sig Dispense Refill  . aspirin 81 MG EC tablet Take 81 mg by mouth daily.      Marland Kitchen atorvastatin (LIPITOR) 40 MG tablet TAKE 1 TABLET BY MOUTH  DAILY 90 tablet 1  . Cholecalciferol (VITAMIN D3) 1000 UNITS CAPS Take 1 capsule (1,000 Units total) by mouth daily. 90 capsule 3  . colchicine 0.6 MG tablet Take 2 tabs and then one hour later take 3 tablet 0  . ferrous sulfate 325 (65 FE) MG tablet Take 325 mg by mouth daily with breakfast.    . gabapentin (NEURONTIN) 300 MG capsule Take 1 capsule (300 mg total) by mouth 3 (three) times daily. 90 capsule 2  . glimepiride (AMARYL) 2 MG tablet Take 1 tablet (2 mg total) by mouth daily before breakfast. 90 tablet 3  . glucose blood (ONE  TOUCH ULTRA TEST) test strip Use as instructed E11.9 200 each 12  . losartan (COZAAR) 100 MG tablet Take 1 tablet by mouth daily.  6  . meclizine (ANTIVERT) 12.5 MG tablet Take 1 tablet (12.5 mg total) by mouth 3 (three) times daily as needed for dizziness. 30 tablet 1  . Multiple Vitamins-Minerals (MULTIVITAMIN PO) Take 1 tablet by mouth daily.    . pantoprazole (PROTONIX) 40 MG tablet TAKE 1 TABLET BY MOUTH  DAILY 90 tablet 1  . predniSONE (DELTASONE) 10 MG tablet 3 tabs po qd x 3 days, then 2 tabs po qd x 3 days, then 1 tab po qd x 3 days 18 tablet 0  . tamsulosin (FLOMAX) 0.4 MG CAPS capsule TAKE 1 CAPSULE BY MOUTH  DAILY 90 capsule 1  .  traMADol (ULTRAM) 50 MG tablet Take 1 tablet (50 mg total) by mouth every 6 (six) hours as needed. 30 tablet 0  . triamcinolone (NASACORT) 55 MCG/ACT AERO nasal inhaler Place 2 sprays into the nose daily. 1 Inhaler 12   No current facility-administered medications on file prior to visit.    Review of Systems  Constitutional: Negative for other unusual diaphoresis or sweats HENT: Negative for ear discharge or swelling Eyes: Negative for other worsening visual disturbances Respiratory: Negative for stridor or other swelling  Gastrointestinal: Negative for worsening distension or other blood Genitourinary: Negative for retention or other urinary change Musculoskeletal: Negative for other MSK pain or swelling Skin: Negative for color change or other new lesions Neurological: Negative for worsening tremors and other numbness  Psychiatric/Behavioral: Negative for worsening agitation or other fatigue All other system neg per pt    Objective:   Physical Exam BP (!) 144/88   Pulse 89   Temp 97.9 F (36.6 C) (Oral)   Ht 6' 0.25" (1.835 m)   Wt 183 lb (83 kg)   SpO2 93%   BMI 24.65 kg/m  VS noted,  Constitutional: Pt appears in NAD HENT: Head: NCAT.  Right Ear: External ear normal.  Left Ear: External ear normal.  Eyes: . Pupils are equal, round, and reactive to light. Conjunctivae and EOM are normal Nose: without d/c or deformity Neck: Neck supple. Gross normal ROM Cardiovascular: Normal rate and regular rhythm.   Pulmonary/Chest: Effort normal and breath sounds without rales or wheezing.  Abd:  Soft, NT, ND, + BS, no organomegaly Neurological: Pt is alert. At baseline orientation, motor grossly intact Skin: Skin is warm. No rashes, other new lesions, no LE edema but marked dysesthesia along the right about t10 dermatome, also right elbow tip with fatty mass nontender but about 2 cm Psychiatric: Pt behavior is normal without agitation  No other exam findings Lab Results  Component  Value Date   WBC 4.7 04/04/2018   HGB 14.1 04/04/2018   HCT 43.1 04/04/2018   PLT 202.0 04/04/2018   GLUCOSE 89 04/04/2018   CHOL 117 04/04/2018   TRIG 69.0 04/04/2018   HDL 50.20 04/04/2018   LDLCALC 53 04/04/2018   ALT 24 04/04/2018   AST 21 04/04/2018   NA 139 04/04/2018   K 4.3 04/04/2018   CL 102 04/04/2018   CREATININE 1.99 (H) 04/04/2018   BUN 29 (H) 04/04/2018   CO2 27 04/04/2018   TSH 5.59 (H) 04/04/2018   PSA 4.61 (H) 04/09/2017   INR 1.12 06/19/2015   HGBA1C 7.0 (H) 04/04/2018   MICROALBUR <0.7 04/04/2018        Assessment & Plan:

## 2018-04-04 NOTE — Assessment & Plan Note (Signed)
With uncontrolled and persistent pain not improved with tramadol and gabapentin; did complete the valtrex as well; gave rx hardcopy for lidoderm as he wants to shop around; o/w tx with hydrocodone 5 325 prn asd, cymbalta 30 qd, and also sent shingrix rx to pharmacy

## 2018-04-04 NOTE — Patient Instructions (Addendum)
Please take all new medication as prescribed - the lidoderm (given in hardcopy), Cymbalta 30 mg for nerve pain every day (sent to [facility]), and hydrocodone as needed for nerve pain as well  Please note that by law we are only able to send 1 week of hydrocodone for pain at this time.  After this time, please let us know if you feel this was helping and need a new prescription sent to [facility]  Your shingles shot prescription was sent to [facility] on [address]  Please continue all other medications as before, and refills have been done if requested.  Please have the pharmacy call with any other refills you may need.  Please continue your efforts at being more active, low cholesterol diet, and weight control.  You are otherwise up to date with prevention measures today.  Please keep your appointments with your specialists as you may have planned  Please go to the LAB in the Basement (turn left off the elevator) for the tests to be done today  You will be contacted by phone if any changes need to be made immediately.  Otherwise, you will receive a letter about your results with an explanation, but please check with MyChart first.  Please remember to sign up for MyChart if you have not done so, as this will be important to you in the future with finding out test results, communicating by private email, and scheduling acute appointments online when needed.  Please return in 6 months, or sooner if needed  OK to cancel the Feb 5 appt

## 2018-04-05 ENCOUNTER — Telehealth: Payer: Self-pay

## 2018-04-05 NOTE — Telephone Encounter (Signed)
-----  Message from [name], MD sent at 04/04/2018  6:11 PM EST ----- Left message on MyChart, pt to cont same tx except  The test results show that your current treatment is OK, except the kidney function is worsening, and we should refer you to Nephrology (kidney doctor) for further evaluation and treatment if needed.  I will place the referral, and you should hear within a few months at least, as it takes this amount of time for this kind of referral.    Nicholas Patton to please inform pt, I will do referral

## 2018-04-05 NOTE — Telephone Encounter (Signed)
Pt has viewed results via MyChart

## 2018-04-07 ENCOUNTER — Ambulatory Visit: Payer: Self-pay

## 2018-04-07 NOTE — Telephone Encounter (Signed)
Ok for pt to return to previous gabapentin tx and lidoderm, thanks

## 2018-04-07 NOTE — Telephone Encounter (Signed)
Pt called to say he has just Monday started to take pain medication for his shingles. He is taking Hydrocodone- acetaminophen. And he was also prescribed Cymbalta.  He says he has not been able to sleep since starting these medications and he feels nauseous and has dry heaves every morning. He has no appitite. He want to stop these two new medications and restart his gabapentin. He has received his lidocaine patches from [facility]. Pt advised that the Hydrocodone is only as needed and if he does not need it it is ok to not take.  NT will route the Cymbalta question and Gabapentin to office for advice.  Answer Assessment - Initial Assessment Questions 1. SYMPTOMS: "Do you have any symptoms?"     Nausea no appitite with new medication unable to sleep 2. SEVERITY: If symptoms are present, ask "Are they mild, moderate or severe?"     Severe  Pt requesting to change back to Gabapentin He has pain patches now  Protocols used: MEDICATION QUESTION CALL-A-AH

## 2018-04-07 NOTE — Telephone Encounter (Signed)
Pt has been informed and expressed understanding.  

## 2018-04-13 ENCOUNTER — Ambulatory Visit: Payer: Medicare Other | Admitting: Internal Medicine

## 2018-04-20 ENCOUNTER — Ambulatory Visit (INDEPENDENT_AMBULATORY_CARE_PROVIDER_SITE_OTHER): Payer: Medicare Other | Admitting: Podiatry

## 2018-04-20 ENCOUNTER — Encounter: Payer: Self-pay | Admitting: Podiatry

## 2018-04-20 VITALS — BP 150/70 | HR 60

## 2018-04-20 DIAGNOSIS — M79675 Pain in left toe(s): Secondary | ICD-10-CM | POA: Diagnosis not present

## 2018-04-20 DIAGNOSIS — L84 Corns and callosities: Secondary | ICD-10-CM

## 2018-04-20 DIAGNOSIS — M2042 Other hammer toe(s) (acquired), left foot: Secondary | ICD-10-CM | POA: Diagnosis not present

## 2018-04-20 DIAGNOSIS — M79674 Pain in right toe(s): Secondary | ICD-10-CM | POA: Diagnosis not present

## 2018-04-20 DIAGNOSIS — B351 Tinea unguium: Secondary | ICD-10-CM

## 2018-04-20 DIAGNOSIS — M2041 Other hammer toe(s) (acquired), right foot: Secondary | ICD-10-CM

## 2018-04-20 DIAGNOSIS — E1142 Type 2 diabetes mellitus with diabetic polyneuropathy: Secondary | ICD-10-CM | POA: Diagnosis not present

## 2018-04-20 NOTE — Patient Instructions (Signed)

## 2018-04-24 NOTE — Progress Notes (Signed)
Subjective: Nicholas Patton presents today for diabetic foot examination, referred by [name], MD. He presents with history of diabetes and diabetic neuropathy. He has cc of painful, discolored, thick toenails 1-5 b/l and painful lesions left hallux and left 2nd digit which interfere with daily activities.  Pain is aggravated when wearing enclosed shoe gear. Pain is relieved with periodic professional debridement. He has seen a Podiatrist in the past and the Podiatrist retired. He is looking to establish routine care with our clinic on today.  He does have h/o neuropathic pain.   [name] is his PCP and last visit was 04/04/2018.  He states his blood sugar was 117 mg/dl.  Past Medical History:  Diagnosis Date  . Anemia   . Arthritis   . Cataract    REMOVED BILATERAL  . Colon polyps   . DIABETES MELLITUS, TYPE II    takes Metformin and Glimepiride daily  . Diverticulosis   . GERD (gastroesophageal reflux disease)    takes Protonix daily  . History of colon polyps   . HYPERLIPIDEMIA    takes Atorvastatin daily  . HYPERTENSION    takes Amlodipine and Hyzaar daily  . Ileus following gastrointestinal surgery ([place]) 07/22/2012  . Joint pain   . Peripheral neuropathy   . Peripheral neuropathy   . PERIPHERAL NEUROPATHY, FEET 10/06/2007  . VITAMIN B12 DEFICIENCY 09/06/2008  . VITAMIN D DEFICIENCY 01/16/2010   takes Vitamin D daily    Patient Active Problem List   Diagnosis Date Noted  . PHN (postherpetic neuralgia) 04/04/2018  . Shingles 03/11/2018  . Acute gout due to renal impairment involving toe of left foot 02/02/2018  . Eustachian tube dysfunction, left 12/06/2017  . MGUS (monoclonal gammopathy of unknown significance) 10/28/2017  . Elevated blood protein 10/08/2017  . Peripheral edema 09/17/2017  . Anemia, iron deficiency 04/10/2016  . Left-sided nosebleed 01/13/2016  . Hematochezia 11/21/2015  . Peripheral neuropathic pain 10/25/2015  . Low back pain 10/08/2015   . CKD (chronic kidney disease) stage 3, GFR 30-59 ml/min (HCC) 10/08/2015  . S/P cervical spinal fusion 06/27/2015  . Increased prostate specific antigen (PSA) velocity 04/09/2015  . Chest pain 08/14/2014  . Dyspnea 08/14/2014  . Urinary frequency 06/19/2014  . Overactive bladder 09/27/2013  . Onychomycosis 08/08/2013  . Pain in lower limb 05/15/2013  . Diabetic peripheral neuropathy ([place]) 05/15/2013  . Other hammer toe (acquired) 05/15/2013  . HAV (hallux abducto valgus) 05/15/2013  . Blood in mouth of unknown source 02/09/2013  . Vertigo 12/29/2012  . Acute upper respiratory infection 12/29/2012  . Erectile dysfunction 09/22/2012  . Left ear hearing loss 09/13/2012  . S/P laparoscopic cholecystectomy 08/03/2012  . Neck pain on left side 04/04/2012  . Trapezoid ligament sprain 04/04/2012  . Bladder neck obstruction 10/06/2011  . Cervical radiculitis 09/26/2010  . Preventative health care 09/14/2010  . VITAMIN D DEFICIENCY 01/16/2010  . PERS HX TOBACCO USE PRESENTING HAZARDS HEALTH 09/17/2009  . VITAMIN B12 DEFICIENCY 09/06/2008  . INSOMNIA 06/21/2008  . LEG PAIN, BILATERAL 10/06/2007  . Diabetes ([place]) 01/24/2007  . Hyperlipidemia 01/24/2007  . Allergic rhinitis 01/24/2007  . BPH (benign prostatic hypertrophy) 01/24/2007  . Essential hypertension 10/05/2006  . GERD 10/05/2006    Past Surgical History:  Procedure Laterality Date  . CATARACT EXTRACTION     both eyes  . CHOLECYSTECTOMY N/A 07/18/2012   Procedure: LAPAROSCOPIC CHOLECYSTECTOMY WITH INTRAOPERATIVE CHOLANGIOGRAM;  Surgeon: Zenovia Jarred, MD;  Location: [facility];  Service: General;  Laterality:  N/A;  . COLONOSCOPY    . POLYPECTOMY    . POSTERIOR CERVICAL FUSION/FORAMINOTOMY N/A 06/27/2015   Procedure: Posterior Cervical Fusion with lateral mass fixation Cervical four to cervical seven;  Surgeon: Eustace Moore, MD;  Location: [facility];  Service: Neurosurgery;  Laterality: N/A;  . TOTAL KNEE ARTHROPLASTY  2001    Left  . TURP VAPORIZATION       Current Outpatient Medications:  .  aspirin 81 MG EC tablet, Take 81 mg by mouth daily.  , Disp: , Rfl:  .  atorvastatin (LIPITOR) 40 MG tablet, TAKE 1 TABLET BY MOUTH  DAILY, Disp: 90 tablet, Rfl: 1 .  Cholecalciferol (VITAMIN D3) 1000 UNITS CAPS, Take 1 capsule (1,000 Units total) by mouth daily., Disp: 90 capsule, Rfl: 3 .  colchicine 0.6 MG tablet, Take 2 tabs and then one hour later take, Disp: 3 tablet, Rfl: 0 .  DULoxetine (CYMBALTA) 30 MG capsule, Take 1 capsule (30 mg total) by mouth daily., Disp: 30 capsule, Rfl: 11 .  ferrous sulfate 325 (65 FE) MG tablet, Take 325 mg by mouth daily with breakfast., Disp: , Rfl:  .  gabapentin (NEURONTIN) 300 MG capsule, Take 1 capsule (300 mg total) by mouth 3 (three) times daily., Disp: 90 capsule, Rfl: 2 .  glimepiride (AMARYL) 2 MG tablet, Take 1 tablet (2 mg total) by mouth daily before breakfast., Disp: 90 tablet, Rfl: 3 .  glucose blood (ONE TOUCH ULTRA TEST) test strip, Use as instructed E11.9, Disp: 200 each, Rfl: 12 .  HYDROcodone-acetaminophen (NORCO/VICODIN) 5-325 MG tablet, Take 1 tablet by mouth every 6 (six) hours as needed for moderate pain., Disp: 30 tablet, Rfl: 0 .  lidocaine (LIDODERM) 5 %, Place 1 patch onto the skin daily. Remove & Discard patch within 12 hours or as directed by MD, Disp: 30 patch, Rfl: 1 .  losartan (COZAAR) 100 MG tablet, Take 1 tablet by mouth daily., Disp: , Rfl: 6 .  meclizine (ANTIVERT) 12.5 MG tablet, Take 1 tablet (12.5 mg total) by mouth 3 (three) times daily as needed for dizziness., Disp: 30 tablet, Rfl: 1 .  Multiple Vitamins-Minerals (MULTIVITAMIN PO), Take 1 tablet by mouth daily., Disp: , Rfl:  .  pantoprazole (PROTONIX) 40 MG tablet, TAKE 1 TABLET BY MOUTH  DAILY, Disp: 90 tablet, Rfl: 1 .  predniSONE (DELTASONE) 10 MG tablet, 3 tabs po qd x 3 days, then 2 tabs po qd x 3 days, then 1 tab po qd x 3 days, Disp: 18 tablet, Rfl: 0 .  tamsulosin (FLOMAX) 0.4 MG CAPS  capsule, TAKE 1 CAPSULE BY MOUTH  DAILY, Disp: 90 capsule, Rfl: 1 .  traMADol (ULTRAM) 50 MG tablet, Take 1 tablet (50 mg total) by mouth every 6 (six) hours as needed., Disp: 30 tablet, Rfl: 0 .  triamcinolone (NASACORT) 55 MCG/ACT AERO nasal inhaler, Place 2 sprays into the nose daily., Disp: 1 Inhaler, Rfl: 12  No Known Allergies  Social History   Occupational History  . Not on file  Tobacco Use  . Smoking status: Former Smoker    Packs/day: 0.00    Types: Cigarettes  . Smokeless tobacco: Never Used  . Tobacco comment: quit smoking around 1990  Substance and Sexual Activity  . Alcohol use: No    Alcohol/week: 0.0 standard drinks  . Drug use: No  . Sexual activity: Never    Family History  Problem Relation Age of Onset  . Diabetes Mellitus II Mother   . Lung cancer  Brother   . Colon cancer Neg Hx   . Esophageal cancer Neg Hx   . Rectal cancer Neg Hx   . Stomach cancer Neg Hx     Immunization History  Administered Date(s) Administered  . Influenza Whole 11/21/2015  . Influenza, High Dose Seasonal PF 01/24/2013, 12/01/2016, 11/22/2017  . Influenza,inj,Quad PF,6+ Mos 12/08/2013  . Influenza-Unspecified 02/08/2015  . Pneumococcal Conjugate-13 03/22/2013  . Pneumococcal Polysaccharide-23 01/10/2016  . Td 10/05/2014   Review of systems: Positive Findings in bold print.  Constitutional:  chills, fatigue, fever, sweats, weight change Communication: Optometrist, sign Ecologist, hand writing, iPad/Android device Head: headaches, head injury Eyes: changes in vision, eye pain, glaucoma, cataracts, macular degeneration, diplopia, glare,  light sensitivity, eyeglasses or contacts, blindness Ears nose mouth throat: Hard of hearing, ringing in ears, deaf, sign language,  vertigo,   nosebleeds,  rhinitis,  cold sores, snoring, swollen glands Cardiovascular: HTN, edema, arrhythmia, pacemaker in place, defibrillator in place,  chest pain/tightness, chronic anticoagulation,  blood clot, heart failure Peripheral Vascular: leg cramps, varicose veins, blood clots, lymphedema Respiratory:  difficulty breathing, denies congestion, SOB, wheezing, cough, emphysema Gastrointestinal: change in appetite or weight, abdominal pain, constipation, diarrhea, nausea, vomiting, vomiting blood, change in bowel habits, abdominal pain, jaundice, rectal bleeding, hemorrhoids, Genitourinary:  nocturia,  pain on urination,  blood in urine, Foley catheter, urinary urgency Musculoskeletal: uses mobility aid,  cramping, stiff joints, painful joints, decreased joint motion, fractures, OA, gout Skin: +changes in toenails, color change, dryness, itching, mole changes,  rash  Neurological: headaches, numbness in feet, paresthesias in feet, burning in feet, fainting,  seizures, change in speech. denies headaches, memory problems/poor historian, cerebral palsy, weakness, paralysis Endocrine: diabetes, hypothyroidism, hyperthyroidism,  goiter, dry mouth, flushing, heat intolerance,  cold intolerance,  excessive thirst, denies polyuria,  nocturia Hematological:  easy bleeding, excessive bleeding, easy bruising, enlarged lymph nodes, on long term blood thinner, history of past transusions Allergy/immunological:  hives, eczema, frequent infections, multiple drug allergies, seasonal allergies, transplant recipient Psychiatric:  anxiety, depression, mood disorder, suicidal ideations, hallucinations   Objective: Vitals:   04/20/18 0956  BP: (!) 150/70  Pulse: 60   Vascular Examination: Capillary refill time immediate x 10 digits Dorsalis pedis and posterior tibial pulses present b/l No digital hair x 10 digits Skin temperature gradient WNL b/l  Dermatological Examination: Skin with normal turgor, texture and tone b/l  Toenails 1-5 b/l discolored, thick, dystrophic with subungual debris and pain with palpation to nailbeds due to thickness of nails.  Interdigital corn lateral left great toe,  medial left 2nd digit and medial right great toe with tenderness to palpation. No erythema, no edema, no drainage. No flocculence.  Musculoskeletal: Muscle strength 5/5 to all LE muscle groups  HAV with bunion b/l  Hammertoes 2-5 b/l  Neurological: Subjective symptoms/history of diabetic neuropathy Sensation intact with 10 gram monofilament Vibratory sensation intact.  Assessment: 1. Painful onychomycosis toenails 1-5 b/l  2. Corns/callus pared lateral left great toe, medial left 2nd digit and medial right great toe 3. NIDDM with neuropathy  Plan: 1. Discussed diabetic foot care principles. Literature dispensed on today. 2. Toenails 1-5 b/l were debrided in length and girth without iatrogenic bleeding. 3. Corns/callus pared lateral left great toe, medial left 2nd digit and medial right great toe without incident. 4. Patient to continue soft, supportive shoe gear 5. Patient to report any pedal injuries to medical professional immediately. 6. Follow up 3 months.  7. Patient/POA to call should there be a concern in the  interim.

## 2018-06-14 ENCOUNTER — Telehealth: Payer: Self-pay | Admitting: *Deleted

## 2018-06-14 NOTE — Telephone Encounter (Signed)
LVM for patient to call back in regards to scheduling AWV with our health coach.

## 2018-07-01 ENCOUNTER — Encounter: Payer: Self-pay | Admitting: Internal Medicine

## 2018-07-05 DIAGNOSIS — E559 Vitamin D deficiency, unspecified: Secondary | ICD-10-CM | POA: Diagnosis not present

## 2018-07-05 DIAGNOSIS — N183 Chronic kidney disease, stage 3 (moderate): Secondary | ICD-10-CM | POA: Diagnosis not present

## 2018-07-05 DIAGNOSIS — D631 Anemia in chronic kidney disease: Secondary | ICD-10-CM | POA: Diagnosis not present

## 2018-07-05 DIAGNOSIS — D472 Monoclonal gammopathy: Secondary | ICD-10-CM | POA: Diagnosis not present

## 2018-07-05 DIAGNOSIS — R600 Localized edema: Secondary | ICD-10-CM | POA: Diagnosis not present

## 2018-07-05 DIAGNOSIS — N189 Chronic kidney disease, unspecified: Secondary | ICD-10-CM | POA: Diagnosis not present

## 2018-07-05 DIAGNOSIS — I129 Hypertensive chronic kidney disease with stage 1 through stage 4 chronic kidney disease, or unspecified chronic kidney disease: Secondary | ICD-10-CM | POA: Diagnosis not present

## 2018-07-20 ENCOUNTER — Encounter: Payer: Self-pay | Admitting: Podiatry

## 2018-07-20 ENCOUNTER — Ambulatory Visit (INDEPENDENT_AMBULATORY_CARE_PROVIDER_SITE_OTHER): Payer: Medicare Other | Admitting: Podiatry

## 2018-07-20 ENCOUNTER — Other Ambulatory Visit: Payer: Self-pay

## 2018-07-20 VITALS — Temp 97.5°F

## 2018-07-20 DIAGNOSIS — E1142 Type 2 diabetes mellitus with diabetic polyneuropathy: Secondary | ICD-10-CM

## 2018-07-20 DIAGNOSIS — L84 Corns and callosities: Secondary | ICD-10-CM

## 2018-07-20 DIAGNOSIS — M79675 Pain in left toe(s): Secondary | ICD-10-CM

## 2018-07-20 DIAGNOSIS — B351 Tinea unguium: Secondary | ICD-10-CM

## 2018-07-20 DIAGNOSIS — M79674 Pain in right toe(s): Secondary | ICD-10-CM

## 2018-07-27 NOTE — Progress Notes (Signed)
Subjective: Nicholas Patton presents today with history of peripheral neuropathy with cc of painful, mycotic toenails.  Pain is aggravated when wearing enclosed shoe gear and relieved with periodic professional debridement.  Patient has peripheral neuropathy managed with gabapentin.  [name], MD is his PCP and last visit was 04/04/2018.   Current Outpatient Medications:  .  amLODipine (NORVASC) 5 MG tablet, Take 5 mg by mouth daily., Disp: , Rfl:  .  aspirin 81 MG EC tablet, Take 81 mg by mouth daily.  , Disp: , Rfl:  .  atorvastatin (LIPITOR) 40 MG tablet, TAKE 1 TABLET BY MOUTH  DAILY, Disp: 90 tablet, Rfl: 1 .  Cholecalciferol (VITAMIN D3) 1000 UNITS CAPS, Take 1 capsule (1,000 Units total) by mouth daily., Disp: 90 capsule, Rfl: 3 .  colchicine 0.6 MG tablet, Take 2 tabs and then one hour later take, Disp: 3 tablet, Rfl: 0 .  DULoxetine (CYMBALTA) 30 MG capsule, Take 1 capsule (30 mg total) by mouth daily., Disp: 30 capsule, Rfl: 11 .  ferrous sulfate 325 (65 FE) MG tablet, Take 325 mg by mouth daily with breakfast., Disp: , Rfl:  .  gabapentin (NEURONTIN) 300 MG capsule, Take 1 capsule (300 mg total) by mouth 3 (three) times daily., Disp: 90 capsule, Rfl: 2 .  glimepiride (AMARYL) 2 MG tablet, Take 1 tablet (2 mg total) by mouth daily before breakfast., Disp: 90 tablet, Rfl: 3 .  glucose blood (ONE TOUCH ULTRA TEST) test strip, Use as instructed E11.9, Disp: 200 each, Rfl: 12 .  HYDROcodone-acetaminophen (NORCO/VICODIN) 5-325 MG tablet, Take 1 tablet by mouth every 6 (six) hours as needed for moderate pain., Disp: 30 tablet, Rfl: 0 .  lidocaine (LIDODERM) 5 %, Place 1 patch onto the skin daily. Remove & Discard patch within 12 hours or as directed by MD, Disp: 30 patch, Rfl: 1 .  losartan (COZAAR) 100 MG tablet, Take 1 tablet by mouth daily., Disp: , Rfl: 6 .  meclizine (ANTIVERT) 12.5 MG tablet, Take 1 tablet (12.5 mg total) by mouth 3 (three) times daily as needed for dizziness.,  Disp: 30 tablet, Rfl: 1 .  Multiple Vitamins-Minerals (MULTIVITAMIN PO), Take 1 tablet by mouth daily., Disp: , Rfl:  .  pantoprazole (PROTONIX) 40 MG tablet, TAKE 1 TABLET BY MOUTH  DAILY, Disp: 90 tablet, Rfl: 1 .  predniSONE (DELTASONE) 10 MG tablet, 3 tabs po qd x 3 days, then 2 tabs po qd x 3 days, then 1 tab po qd x 3 days, Disp: 18 tablet, Rfl: 0 .  tamsulosin (FLOMAX) 0.4 MG CAPS capsule, TAKE 1 CAPSULE BY MOUTH  DAILY, Disp: 90 capsule, Rfl: 1 .  traMADol (ULTRAM) 50 MG tablet, Take 1 tablet (50 mg total) by mouth every 6 (six) hours as needed., Disp: 30 tablet, Rfl: 0 .  triamcinolone (NASACORT) 55 MCG/ACT AERO nasal inhaler, Place 2 sprays into the nose daily., Disp: 1 Inhaler, Rfl: 12  Allergies  Allergen Reactions  . Cymbalta [Duloxetine Hcl]   . Oxycodone     Objective: Vitals:   07/20/18 1024  Temp: (!) 97.5 F (36.4 C)   Vascular Examination: Capillary refill time immediate x 10 digits.  Dorsalis pedis and Posterior tibial pulses present b/l.  Digital hair x 10 digits was absent.  Skin temperature gradient WNL b/l.  Dermatological Examination: Skin with normal turgor, texture and tone b/l.  Toenails 1-5 b/l discolored, thick, dystrophic with subungual debris and pain with palpation to nailbeds due to thickness of nails.  Hyperkeratotic lesion lateral left great toe, medial aspect left 2nd toe, medial right great toe. Tenderness to palpation. No erythema, no edema, no drainage, no flocculence noted.  Musculoskeletal: Muscle strength 5/5 to all muscle groups b/l.  HAV with bunion b/l.  Hammertoes 2-5 b/l.  Neurological: Subjective symptoms of peripheral neuropathy.  Sensation with 10 gram monofilament is intact b/l.  Vibratory sensation intact b/l.  Assessment: 1. Painful onychomycosis toenails 1-5 b/l 2. Corns/callus lateral left great toe, medial left 2nd digit and medial right hallux 3. NIDDM with neuropathy  Plan: 1. Toenails 1-5 b/l were  debrided in length and girth without iatrogenic bleeding. 2. Corns/callus pared lateral left great toe, medial left 2nd digit and medial right hallux utilizing sterile scalpel blade without incident. 3. Patient to continue soft, supportive shoe gear 4. Patient to report any pedal injuries to medical professional  5. Follow up 3 months.  6. Patient/POA to call should there be a concern in the interim.

## 2018-08-02 ENCOUNTER — Encounter: Payer: Self-pay | Admitting: Internal Medicine

## 2018-08-15 ENCOUNTER — Encounter: Payer: Self-pay | Admitting: Internal Medicine

## 2018-08-15 ENCOUNTER — Other Ambulatory Visit: Payer: Self-pay | Admitting: *Deleted

## 2018-08-15 MED ORDER — PANTOPRAZOLE SODIUM 40 MG PO TBEC: 40.0000 mg | DELAYED_RELEASE_TABLET | Freq: Every day | ORAL | 1 refills | Status: DC

## 2018-08-15 MED ORDER — TAMSULOSIN HCL 0.4 MG PO CAPS: 0.4000 mg | ORAL_CAPSULE | Freq: Every day | ORAL | 1 refills | Status: DC

## 2018-08-15 MED ORDER — ATORVASTATIN CALCIUM 40 MG PO TABS: 40.0000 mg | ORAL_TABLET | Freq: Every day | ORAL | 1 refills | Status: DC

## 2018-08-16 ENCOUNTER — Other Ambulatory Visit: Payer: Self-pay

## 2018-08-16 MED ORDER — TAMSULOSIN HCL 0.4 MG PO CAPS: 0.4000 mg | ORAL_CAPSULE | Freq: Every day | ORAL | 1 refills | Status: DC

## 2018-08-16 MED ORDER — ATORVASTATIN CALCIUM 40 MG PO TABS: 40.0000 mg | ORAL_TABLET | Freq: Every day | ORAL | 1 refills | Status: DC

## 2018-08-16 MED ORDER — PANTOPRAZOLE SODIUM 40 MG PO TBEC: 40.0000 mg | DELAYED_RELEASE_TABLET | Freq: Every day | ORAL | 1 refills | Status: DC

## 2018-08-23 DIAGNOSIS — N183 Chronic kidney disease, stage 3 (moderate): Secondary | ICD-10-CM | POA: Diagnosis not present

## 2018-09-15 DIAGNOSIS — H04123 Dry eye syndrome of bilateral lacrimal glands: Secondary | ICD-10-CM | POA: Diagnosis not present

## 2018-09-15 DIAGNOSIS — H4322 Crystalline deposits in vitreous body, left eye: Secondary | ICD-10-CM | POA: Diagnosis not present

## 2018-09-15 DIAGNOSIS — E119 Type 2 diabetes mellitus without complications: Secondary | ICD-10-CM | POA: Diagnosis not present

## 2018-09-15 DIAGNOSIS — H43821 Vitreomacular adhesion, right eye: Secondary | ICD-10-CM | POA: Diagnosis not present

## 2018-09-15 DIAGNOSIS — H40013 Open angle with borderline findings, low risk, bilateral: Secondary | ICD-10-CM | POA: Diagnosis not present

## 2018-09-15 DIAGNOSIS — Z961 Presence of intraocular lens: Secondary | ICD-10-CM | POA: Diagnosis not present

## 2018-09-23 ENCOUNTER — Telehealth: Payer: Self-pay | Admitting: Hematology and Oncology

## 2018-09-23 NOTE — Telephone Encounter (Signed)
Call day 8/25 moved f/u from PM to AM. Confirmed with patient. Also confirmed 8/18 lab.

## 2018-09-28 ENCOUNTER — Other Ambulatory Visit: Payer: Self-pay

## 2018-09-28 DIAGNOSIS — R6889 Other general symptoms and signs: Secondary | ICD-10-CM | POA: Diagnosis not present

## 2018-09-28 DIAGNOSIS — Z20822 Contact with and (suspected) exposure to covid-19: Secondary | ICD-10-CM

## 2018-10-02 LAB — NOVEL CORONAVIRUS, NAA: SARS-CoV-2, NAA: NOT DETECTED

## 2018-10-04 ENCOUNTER — Encounter: Payer: Self-pay | Admitting: Internal Medicine

## 2018-10-04 ENCOUNTER — Other Ambulatory Visit: Payer: Self-pay

## 2018-10-04 ENCOUNTER — Ambulatory Visit (INDEPENDENT_AMBULATORY_CARE_PROVIDER_SITE_OTHER): Payer: Medicare Other | Admitting: Internal Medicine

## 2018-10-04 ENCOUNTER — Other Ambulatory Visit (INDEPENDENT_AMBULATORY_CARE_PROVIDER_SITE_OTHER): Payer: Medicare Other

## 2018-10-04 VITALS — BP 126/86 | HR 81 | Temp 97.6°F | Ht 72.0 in | Wt 179.0 lb

## 2018-10-04 DIAGNOSIS — T7840XA Allergy, unspecified, initial encounter: Secondary | ICD-10-CM | POA: Diagnosis not present

## 2018-10-04 DIAGNOSIS — E1140 Type 2 diabetes mellitus with diabetic neuropathy, unspecified: Secondary | ICD-10-CM | POA: Diagnosis not present

## 2018-10-04 DIAGNOSIS — J309 Allergic rhinitis, unspecified: Secondary | ICD-10-CM

## 2018-10-04 DIAGNOSIS — R7989 Other specified abnormal findings of blood chemistry: Secondary | ICD-10-CM | POA: Diagnosis not present

## 2018-10-04 DIAGNOSIS — R519 Headache, unspecified: Secondary | ICD-10-CM | POA: Insufficient documentation

## 2018-10-04 DIAGNOSIS — R51 Headache: Secondary | ICD-10-CM | POA: Diagnosis not present

## 2018-10-04 DIAGNOSIS — G4489 Other headache syndrome: Secondary | ICD-10-CM

## 2018-10-04 DIAGNOSIS — N183 Chronic kidney disease, stage 3 unspecified: Secondary | ICD-10-CM

## 2018-10-04 LAB — LIPID PANEL
Cholesterol: 138 mg/dL (ref 0–200)
HDL: 65.5 mg/dL (ref >39.00)
LDL Cholesterol: 59 mg/dL (ref 0–99)
NonHDL: 72.78
Total CHOL/HDL Ratio: 2
Triglycerides: 70 mg/dL (ref 0.0–149.0)
VLDL: 14 mg/dL (ref 0.0–40.0)

## 2018-10-04 LAB — HEPATIC FUNCTION PANEL
ALT: 20 U/L (ref 0–53)
AST: 21 U/L (ref 0–37)
Albumin: 4.9 g/dL (ref 3.5–5.2)
Alkaline Phosphatase: 116 U/L (ref 39–117)
Bilirubin, Direct: 0.1 mg/dL (ref 0.0–0.3)
Total Bilirubin: 0.6 mg/dL (ref 0.2–1.2)
Total Protein: 7.7 g/dL (ref 6.0–8.3)

## 2018-10-04 LAB — BASIC METABOLIC PANEL
BUN: 29 mg/dL — ABNORMAL HIGH (ref 6–23)
CO2: 28 mEq/L (ref 19–32)
Calcium: 10.3 mg/dL (ref 8.4–10.5)
Chloride: 105 mEq/L (ref 96–112)
Creatinine, Ser: 1.6 mg/dL — ABNORMAL HIGH (ref 0.40–1.50)
GFR: 50.3 mL/min — ABNORMAL LOW (ref >60.00)
Glucose, Bld: 117 mg/dL — ABNORMAL HIGH (ref 70–99)
Potassium: 4.2 mEq/L (ref 3.5–5.1)
Sodium: 141 mEq/L (ref 135–145)

## 2018-10-04 LAB — HEMOGLOBIN A1C: Hgb A1c MFr Bld: 6.8 % — ABNORMAL HIGH (ref 4.6–6.5)

## 2018-10-04 MED ORDER — METHYLPREDNISOLONE ACETATE 80 MG/ML IJ SUSP
80.0000 mg | Freq: Once | INTRAMUSCULAR | Status: AC
  Administered 2018-10-04: 80 mg via INTRAMUSCULAR

## 2018-10-04 MED ORDER — TRIAMCINOLONE ACETONIDE 55 MCG/ACT NA AERO: 2.0000 | INHALATION_SPRAY | Freq: Every day | NASAL | 12 refills | Status: DC

## 2018-10-04 MED ORDER — CETIRIZINE HCL 10 MG PO TABS: 10.0000 mg | ORAL_TABLET | Freq: Every day | ORAL | 11 refills | Status: DC

## 2018-10-04 NOTE — Assessment & Plan Note (Signed)
stable overall by history and exam, recent data reviewed with pt, and pt to continue medical treatment as before,  to f/u any worsening symptoms or concerns

## 2018-10-04 NOTE — Progress Notes (Signed)
Subjective:    Patient ID: Nicholas Patton, male    DOB: 07-27-1934, 84 y.o.   MRN: [id number]  HPI    Here to f/u; overall doing ok,  Pt denies chest pain, increasing sob or doe, wheezing, orthopnea, PND, increased LE swelling, palpitations, dizziness or syncope.  Pt denies new neurological symptoms such as new headache, or facial or extremity weakness or numbness.  Pt denies polydipsia, polyuria, or low sugar episode.  Pt states overall good compliance with meds, mostly trying to follow appropriate diet, with wt overall stable,  but little exercise however.   Not exercise recently due to pandemic and center is close. Also c/o HA x 2 mo with dizziness, mild, intermittent, bifrontal, dull assoc with nose and ears stopped up possibly, Does have several wks ongoing nasal allergy symptoms with clearish congestion, itch and sneezing, without fever, pain, ST, cough, swelling or wheezing. Denies hyper or hypo thyroid symptoms such as voice, skin or hair change. Denies hyper or hypo thyroid symptoms such as voice, skin or hair change. Past Medical History:  Diagnosis Date  . Anemia   . Arthritis   . Cataract    REMOVED BILATERAL  . Colon polyps   . DIABETES MELLITUS, TYPE II    takes Metformin and Glimepiride daily  . Diverticulosis   . GERD (gastroesophageal reflux disease)    takes Protonix daily  . History of colon polyps   . HYPERLIPIDEMIA    takes Atorvastatin daily  . HYPERTENSION    takes Amlodipine and Hyzaar daily  . Ileus following gastrointestinal surgery ([facility]) 07/22/2012  . Joint pain   . Peripheral neuropathy   . Peripheral neuropathy   . PERIPHERAL NEUROPATHY, FEET 10/06/2007  . VITAMIN B12 DEFICIENCY 09/06/2008  . VITAMIN D DEFICIENCY 01/16/2010   takes Vitamin D daily   Past Surgical History:  Procedure Laterality Date  . CATARACT EXTRACTION     both eyes  . CHOLECYSTECTOMY N/A 07/18/2012   Procedure: LAPAROSCOPIC CHOLECYSTECTOMY WITH INTRAOPERATIVE CHOLANGIOGRAM;  Surgeon:  Zenovia Jarred, MD;  Location: [facility];  Service: General;  Laterality: N/A;  . COLONOSCOPY    . POLYPECTOMY    . POSTERIOR CERVICAL FUSION/FORAMINOTOMY N/A 06/27/2015   Procedure: Posterior Cervical Fusion with lateral mass fixation Cervical four to cervical seven;  Surgeon: Eustace Moore, MD;  Location: [facility];  Service: Neurosurgery;  Laterality: N/A;  . TOTAL KNEE ARTHROPLASTY  2001   Left  . TURP VAPORIZATION      reports that he has quit smoking. His smoking use included cigarettes. He smoked 0.00 packs per day. He has never used smokeless tobacco. He reports that he does not drink alcohol or use drugs. family history includes Diabetes Mellitus II in his mother; Lung cancer in his brother. Allergies  Allergen Reactions  . Cymbalta [Duloxetine Hcl]   . Oxycodone    Current Outpatient Medications on File Prior to Visit  Medication Sig Dispense Refill  . amLODipine (NORVASC) 5 MG tablet Take 5 mg by mouth daily.    Marland Kitchen aspirin 81 MG EC tablet Take 81 mg by mouth daily.      Marland Kitchen atorvastatin (LIPITOR) 40 MG tablet Take 1 tablet (40 mg total) by mouth daily. 90 tablet 1  . Cholecalciferol (VITAMIN D3) 1000 UNITS CAPS Take 1 capsule (1,000 Units total) by mouth daily. 90 capsule 3  . colchicine 0.6 MG tablet Take 2 tabs and then one hour later take 3 tablet 0  . DULoxetine (CYMBALTA) 30  MG capsule Take 1 capsule (30 mg total) by mouth daily. 30 capsule 11  . ferrous sulfate 325 (65 FE) MG tablet Take 325 mg by mouth daily with breakfast.    . gabapentin (NEURONTIN) 300 MG capsule Take 1 capsule (300 mg total) by mouth 3 (three) times daily. 90 capsule 2  . glimepiride (AMARYL) 2 MG tablet Take 1 tablet (2 mg total) by mouth daily before breakfast. 90 tablet 3  . glucose blood (ONE TOUCH ULTRA TEST) test strip Use as instructed E11.9 200 each 12  . HYDROcodone-acetaminophen (NORCO/VICODIN) 5-325 MG tablet Take 1 tablet by mouth every 6 (six) hours as needed for moderate pain. 30 tablet 0   . lidocaine (LIDODERM) 5 % Place 1 patch onto the skin daily. Remove & Discard patch within 12 hours or as directed by MD 30 patch 1  . losartan (COZAAR) 100 MG tablet Take 1 tablet by mouth daily.  6  . meclizine (ANTIVERT) 12.5 MG tablet Take 1 tablet (12.5 mg total) by mouth 3 (three) times daily as needed for dizziness. 30 tablet 1  . Multiple Vitamins-Minerals (MULTIVITAMIN PO) Take 1 tablet by mouth daily.    . pantoprazole (PROTONIX) 40 MG tablet Take 1 tablet (40 mg total) by mouth daily. 90 tablet 1  . predniSONE (DELTASONE) 10 MG tablet 3 tabs po qd x 3 days, then 2 tabs po qd x 3 days, then 1 tab po qd x 3 days 18 tablet 0  . tamsulosin (FLOMAX) 0.4 MG CAPS capsule Take 1 capsule (0.4 mg total) by mouth daily. 90 capsule 1  . traMADol (ULTRAM) 50 MG tablet Take 1 tablet (50 mg total) by mouth every 6 (six) hours as needed. 30 tablet 0  . triamcinolone (NASACORT) 55 MCG/ACT AERO nasal inhaler Place 2 sprays into the nose daily. 1 Inhaler 12   No current facility-administered medications on file prior to visit.    Review of Systems  Constitutional: Negative for other unusual diaphoresis or sweats HENT: Negative for ear discharge or swelling Eyes: Negative for other worsening visual disturbances Respiratory: Negative for stridor or other swelling  Gastrointestinal: Negative for worsening distension or other blood Genitourinary: Negative for retention or other urinary change Musculoskeletal: Negative for other MSK pain or swelling Skin: Negative for color change or other new lesions Neurological: Negative for worsening tremors and other numbness  Psychiatric/Behavioral: Negative for worsening agitation or other fatigue All other system neg per pt    Objective:   Physical Exam BP 126/86   Pulse 81   Temp 97.6 F (36.4 C) (Oral)   Ht 6' (1.829 m)   Wt 179 lb (81.2 kg)   SpO2 94%   BMI 24.28 kg/m  VS noted,  Constitutional: Pt appears in NAD HENT: Head: NCAT.  Right Ear:  External ear normal.  Left Ear: External ear normal.  Bilat tm's with mild erythema.  Max sinus areas non tender.  Pharynx with mild erythema, no exudate Eyes: . Pupils are equal, round, and reactive to light. Conjunctivae and EOM are normal Nose: without d/c or deformity Neck: Neck supple. Gross normal ROM Cardiovascular: Normal rate and regular rhythm.   Pulmonary/Chest: Effort normal and breath sounds without rales or wheezing.  Neurological: Pt is alert. At baseline orientation, motor grossly intact, cn 2-12 intact Skin: Skin is warm. No rashes, other new lesions, no LE edema Psychiatric: Pt behavior is normal without agitation  No other exam findings Lab Results  Component Value Date   WBC 4.7  04/04/2018   HGB 14.1 04/04/2018   HCT 43.1 04/04/2018   PLT 202.0 04/04/2018   GLUCOSE 117 (H) 10/04/2018   CHOL 138 10/04/2018   TRIG 70.0 10/04/2018   HDL 65.50 10/04/2018   LDLCALC 59 10/04/2018   ALT 20 10/04/2018   AST 21 10/04/2018   NA 141 10/04/2018   K 4.2 10/04/2018   CL 105 10/04/2018   CREATININE 1.60 (H) 10/04/2018   BUN 29 (H) 10/04/2018   CO2 28 10/04/2018   TSH 5.59 (H) 04/04/2018   PSA 4.61 (H) 04/09/2017   INR 1.12 06/19/2015   HGBA1C 6.8 (H) 10/04/2018   MICROALBUR <0.7 04/04/2018      Assessment & Plan:

## 2018-10-04 NOTE — Assessment & Plan Note (Signed)
Asympt, for f/u lab today

## 2018-10-04 NOTE — Assessment & Plan Note (Addendum)
Exam benign, persistent pain, for Head MRI  Note:  Total time for pt hx, exam, review of record with pt in the room, determination of diagnoses and plan for further eval and tx is > 40 min, with over 50% spent in coordination and counseling of patient including the differential dx, tx, further evaluation and other management of HA, abnormal TSH, CKD, HTN, DM, allergies

## 2018-10-04 NOTE — Patient Instructions (Signed)
You had the steroid shot today   Please take all new medication as prescribed - the zyrtec and nasacort for allergies  Please continue all other medications as before, and refills have been done if requested.  Please have the pharmacy call with any other refills you may need.  Please continue your efforts at being more active, low cholesterol diet, and weight control.  You are otherwise up to date with prevention measures today.  Please keep your appointments with your specialists as you may have planned  You will be contacted regarding the referral for: MRI for the brain  Please go to the LAB in the Basement (turn left off the elevator) for the tests to be done today  You will be contacted by phone if any changes need to be made immediately.  Otherwise, you will receive a letter about your results with an explanation, but please check with MyChart first.  Please remember to sign up for MyChart if you have not done so, as this will be important to you in the future with finding out test results, communicating by private email, and scheduling acute appointments online when needed.  Please return in 6 months, or sooner if needed

## 2018-10-04 NOTE — Assessment & Plan Note (Signed)
Mild to mod, for zyrtec and nasaocrt asd,  to f/u any worsening symptoms or concerns

## 2018-10-06 ENCOUNTER — Telehealth: Payer: Self-pay | Admitting: Internal Medicine

## 2018-10-06 MED ORDER — TRAMADOL HCL 50 MG PO TABS: 50.0000 mg | ORAL_TABLET | Freq: Four times a day (QID) | ORAL | 0 refills | Status: DC | PRN

## 2018-10-06 MED ORDER — CETIRIZINE HCL 10 MG PO TABS: 10.0000 mg | ORAL_TABLET | Freq: Every day | ORAL | 11 refills | Status: DC

## 2018-10-06 MED ORDER — TRIAMCINOLONE ACETONIDE 55 MCG/ACT NA AERO: 2.0000 | INHALATION_SPRAY | Freq: Every day | NASAL | 12 refills | Status: DC

## 2018-10-06 NOTE — Telephone Encounter (Signed)
Relation to pt: self  Call back number: [phone]  Pharmacy: [facility], [place] [phone] (Phone) [phone] (Fax)     Reason for call:  Patient states he would like the following medication to go to retail not mail order, patient would like mail order request cancelled triamcinolone (NASACORT) 55 MCG/ACT AERO nasal inhaler  traMADol (ULTRAM) 50 MG tablet  cetirizine (ZYRTEC) 10 MG tablet

## 2018-10-06 NOTE — Telephone Encounter (Signed)
Done erx 

## 2018-10-18 DIAGNOSIS — N183 Chronic kidney disease, stage 3 (moderate): Secondary | ICD-10-CM | POA: Diagnosis not present

## 2018-10-18 DIAGNOSIS — N189 Chronic kidney disease, unspecified: Secondary | ICD-10-CM | POA: Diagnosis not present

## 2018-10-19 ENCOUNTER — Encounter: Payer: Self-pay | Admitting: Podiatry

## 2018-10-19 ENCOUNTER — Other Ambulatory Visit: Payer: Self-pay

## 2018-10-19 ENCOUNTER — Ambulatory Visit (INDEPENDENT_AMBULATORY_CARE_PROVIDER_SITE_OTHER): Payer: Medicare Other | Admitting: Podiatry

## 2018-10-19 VITALS — Temp 97.6°F

## 2018-10-19 DIAGNOSIS — E1142 Type 2 diabetes mellitus with diabetic polyneuropathy: Secondary | ICD-10-CM

## 2018-10-19 DIAGNOSIS — M79675 Pain in left toe(s): Secondary | ICD-10-CM | POA: Diagnosis not present

## 2018-10-19 DIAGNOSIS — M79674 Pain in right toe(s): Secondary | ICD-10-CM | POA: Diagnosis not present

## 2018-10-19 DIAGNOSIS — B351 Tinea unguium: Secondary | ICD-10-CM | POA: Diagnosis not present

## 2018-10-19 DIAGNOSIS — L84 Corns and callosities: Secondary | ICD-10-CM

## 2018-10-19 NOTE — Patient Instructions (Signed)
Diabetes Mellitus and Foot Care Foot care is an important part of your health, especially when you have diabetes. Diabetes may cause you to have problems because of poor blood flow (circulation) to your feet and legs, which can cause your skin to:  Become thinner and drier.  Break more easily.  Heal more slowly.  Peel and crack. You may also have nerve damage (neuropathy) in your legs and feet, causing decreased feeling in them. This means that you may not notice minor injuries to your feet that could lead to more serious problems. Noticing and addressing any potential problems early is the best way to prevent future foot problems. How to care for your feet Foot hygiene  Wash your feet daily with warm water and mild soap. Do not use hot water. Then, pat your feet and the areas between your toes until they are completely dry. Do not soak your feet as this can dry your skin.  Trim your toenails straight across. Do not dig under them or around the cuticle. File the edges of your nails with an emery board or nail file.  Apply a moisturizing lotion or petroleum jelly to the skin on your feet and to dry, brittle toenails. Use lotion that does not contain alcohol and is unscented. Do not apply lotion between your toes. Shoes and socks  Wear clean socks or stockings every day. Make sure they are not too tight. Do not wear knee-high stockings since they may decrease blood flow to your legs.  Wear shoes that fit properly and have enough cushioning. Always look in your shoes before you put them on to be sure there are no objects inside.  To break in new shoes, wear them for just a few hours a day. This prevents injuries on your feet. Wounds, scrapes, corns, and calluses  Check your feet daily for blisters, cuts, bruises, sores, and redness. If you cannot see the bottom of your feet, use a mirror or ask someone for help.  Do not cut corns or calluses or try to remove them with medicine.  If you  find a minor scrape, cut, or break in the skin on your feet, keep it and the skin around it clean and dry. You may clean these areas with mild soap and water. Do not clean the area with peroxide, alcohol, or iodine.  If you have a wound, scrape, corn, or callus on your foot, look at it several times a day to make sure it is healing and not infected. Check for: ? Redness, swelling, or pain. ? Fluid or blood. ? Warmth. ? Pus or a bad smell. General instructions  Do not cross your legs. This may decrease blood flow to your feet.  Do not use heating pads or hot water bottles on your feet. They may burn your skin. If you have lost feeling in your feet or legs, you may not know this is happening until it is too late.  Protect your feet from hot and cold by wearing shoes, such as at the beach or on hot pavement.  Schedule a complete foot exam at least once a year (annually) or more often if you have foot problems. If you have foot problems, report any cuts, sores, or bruises to your health care provider immediately. Contact a health care provider if:  You have a medical condition that increases your risk of infection and you have any cuts, sores, or bruises on your feet.  You have an injury that is not   healing.  You have redness on your legs or feet.  You feel burning or tingling in your legs or feet.  You have pain or cramps in your legs and feet.  Your legs or feet are numb.  Your feet always feel cold.  You have pain around a toenail. Get help right away if:  You have a wound, scrape, corn, or callus on your foot and: ? You have pain, swelling, or redness that gets worse. ? You have fluid or blood coming from the wound, scrape, corn, or callus. ? Your wound, scrape, corn, or callus feels warm to the touch. ? You have pus or a bad smell coming from the wound, scrape, corn, or callus. ? You have a fever. ? You have a red line going up your leg. Summary  Check your feet every day  for cuts, sores, red spots, swelling, and blisters.  Moisturize feet and legs daily.  Wear shoes that fit properly and have enough cushioning.  If you have foot problems, report any cuts, sores, or bruises to your health care provider immediately.  Schedule a complete foot exam at least once a year (annually) or more often if you have foot problems. This information is not intended to replace advice given to you by your health care provider. Make sure you discuss any questions you have with your health care provider. Document Released: 02/21/2000 Document Revised: 04/07/2017 Document Reviewed: 03/27/2016 Elsevier Patient Education  2020 [employer]   Onychomycosis/Fungal Toenails  WHAT IS IT? An infection that lies within the keratin of your nail plate that is caused by a fungus.  WHY ME? Fungal infections affect all ages, sexes, races, and creeds.  There may be many factors that predispose you to a fungal infection such as age, coexisting medical conditions such as diabetes, or an autoimmune disease; stress, medications, fatigue, genetics, etc.  Bottom line: fungus thrives in a warm, moist environment and your shoes offer such a location.  IS IT CONTAGIOUS? Theoretically, yes.  You do not want to share shoes, nail clippers or files with someone who has fungal toenails.  Walking around barefoot in the same room or sleeping in the same bed is unlikely to transfer the organism.  It is important to realize, however, that fungus can spread easily from one nail to the next on the same foot.  HOW DO WE TREAT THIS?  There are several ways to treat this condition.  Treatment may depend on many factors such as age, medications, pregnancy, liver and kidney conditions, etc.  It is best to ask your doctor which options are available to you.  1. No treatment.   Unlike many other medical concerns, you can live with this condition.  However for many people this can be a painful condition and may lead to  ingrown toenails or a bacterial infection.  It is recommended that you keep the nails cut short to help reduce the amount of fungal nail. 2. Topical treatment.  These range from herbal remedies to prescription strength nail lacquers.  About 40-50% effective, topicals require twice daily application for approximately 9 to 12 months or until an entirely new nail has grown out.  The most effective topicals are medical grade medications available through physicians offices. 3. Oral antifungal medications.  With an 80-90% cure rate, the most common oral medication requires 3 to 4 months of therapy and stays in your system for a year as the new nail grows out.  Oral antifungal medications do require   blood work to make sure it is a safe drug for you.  A liver function panel will be performed prior to starting the medication and after the first month of treatment.  It is important to have the blood work performed to avoid any harmful side effects.  In general, this medication safe but blood work is required. 4. Laser Therapy.  This treatment is performed by applying a specialized laser to the affected nail plate.  This therapy is noninvasive, fast, and non-painful.  It is not covered by insurance and is therefore, out of pocket.  The results have been very good with a 80-95% cure rate.  The [facility] is the only practice in the area to offer this therapy. 5. Permanent Nail Avulsion.  Removing the entire nail so that a new nail will not grow back. 

## 2018-10-25 ENCOUNTER — Other Ambulatory Visit: Payer: Self-pay

## 2018-10-25 ENCOUNTER — Inpatient Hospital Stay: Payer: Medicare Other | Attending: Hematology and Oncology

## 2018-10-25 DIAGNOSIS — Z79899 Other long term (current) drug therapy: Secondary | ICD-10-CM | POA: Diagnosis not present

## 2018-10-25 DIAGNOSIS — Z7982 Long term (current) use of aspirin: Secondary | ICD-10-CM | POA: Diagnosis not present

## 2018-10-25 DIAGNOSIS — Z7984 Long term (current) use of oral hypoglycemic drugs: Secondary | ICD-10-CM | POA: Insufficient documentation

## 2018-10-25 DIAGNOSIS — D472 Monoclonal gammopathy: Secondary | ICD-10-CM | POA: Diagnosis not present

## 2018-10-25 LAB — CBC WITH DIFFERENTIAL (CANCER CENTER ONLY)
Abs Immature Granulocytes: 0.01 10*3/uL (ref 0.00–0.07)
Basophils Absolute: 0 10*3/uL (ref 0.0–0.1)
Basophils Relative: 1 %
Eosinophils Absolute: 0.1 10*3/uL (ref 0.0–0.5)
Eosinophils Relative: 2 %
HCT: 43.9 % (ref 39.0–52.0)
Hemoglobin: 14 g/dL (ref 13.0–17.0)
Immature Granulocytes: 0 %
Lymphocytes Relative: 26 %
Lymphs Abs: 1 10*3/uL (ref 0.7–4.0)
MCH: 29.7 pg (ref 26.0–34.0)
MCHC: 31.9 g/dL (ref 30.0–36.0)
MCV: 93.2 fL (ref 80.0–100.0)
Monocytes Absolute: 0.3 10*3/uL (ref 0.1–1.0)
Monocytes Relative: 9 %
Neutro Abs: 2.3 10*3/uL (ref 1.7–7.7)
Neutrophils Relative %: 62 %
Platelet Count: 162 10*3/uL (ref 150–400)
RBC: 4.71 MIL/uL (ref 4.22–5.81)
RDW: 13.6 % (ref 11.5–15.5)
WBC Count: 3.8 10*3/uL — ABNORMAL LOW (ref 4.0–10.5)
nRBC: 0 % (ref 0.0–0.2)

## 2018-10-25 LAB — CMP (CANCER CENTER ONLY)
ALT: 22 U/L (ref 0–44)
AST: 20 U/L (ref 15–41)
Albumin: 4.2 g/dL (ref 3.5–5.0)
Alkaline Phosphatase: 126 U/L (ref 38–126)
Anion gap: 11 (ref 5–15)
BUN: 26 mg/dL — ABNORMAL HIGH (ref 8–23)
CO2: 25 mmol/L (ref 22–32)
Calcium: 9.8 mg/dL (ref 8.9–10.3)
Chloride: 107 mmol/L (ref 98–111)
Creatinine: 1.67 mg/dL — ABNORMAL HIGH (ref 0.61–1.24)
GFR, Est AFR Am: 44 mL/min — ABNORMAL LOW (ref >60)
GFR, Estimated: 38 mL/min — ABNORMAL LOW (ref >60)
Glucose, Bld: 110 mg/dL — ABNORMAL HIGH (ref 70–99)
Potassium: 4.1 mmol/L (ref 3.5–5.1)
Sodium: 143 mmol/L (ref 135–145)
Total Bilirubin: 0.5 mg/dL (ref 0.3–1.2)
Total Protein: 7.1 g/dL (ref 6.5–8.1)

## 2018-10-25 NOTE — Assessment & Plan Note (Addendum)
Blood work review 09/20/2017: M protein 0.2 g (IgA kappa by immunofixation) Quantitative immunoglobulins: IgG 569, IgA 219, IgM 28 Serum creatinine 1.73, albumin 4.3, calcium 9.9, hemoglobin 12.5 Bone survey 11/09/2017: Benign  10/25/2018: M protein 0.2 g (same as before) IgA kappa Serum free kappa light chain: 1409, KL ratio 136.8 (these labs were not done previously) Hemoglobin 14  Return to clinic in 1 year with labs done 1 week ahead of time and follow-up.

## 2018-10-26 DIAGNOSIS — E559 Vitamin D deficiency, unspecified: Secondary | ICD-10-CM | POA: Diagnosis not present

## 2018-10-26 DIAGNOSIS — D631 Anemia in chronic kidney disease: Secondary | ICD-10-CM | POA: Diagnosis not present

## 2018-10-26 DIAGNOSIS — D472 Monoclonal gammopathy: Secondary | ICD-10-CM | POA: Diagnosis not present

## 2018-10-26 DIAGNOSIS — I129 Hypertensive chronic kidney disease with stage 1 through stage 4 chronic kidney disease, or unspecified chronic kidney disease: Secondary | ICD-10-CM | POA: Diagnosis not present

## 2018-10-26 DIAGNOSIS — N183 Chronic kidney disease, stage 3 (moderate): Secondary | ICD-10-CM | POA: Diagnosis not present

## 2018-10-26 DIAGNOSIS — R600 Localized edema: Secondary | ICD-10-CM | POA: Diagnosis not present

## 2018-10-26 LAB — KAPPA/LAMBDA LIGHT CHAINS
Kappa free light chain: 1409 mg/L — ABNORMAL HIGH (ref 3.3–19.4)
Kappa, lambda light chain ratio: 136.8 — ABNORMAL HIGH (ref 0.26–1.65)
Lambda free light chains: 10.3 mg/L (ref 5.7–26.3)

## 2018-10-27 LAB — MULTIPLE MYELOMA PANEL, SERUM
Albumin SerPl Elph-Mcnc: 4.3 g/dL (ref 2.9–4.4)
Albumin/Glob SerPl: 1.9 — ABNORMAL HIGH (ref 0.7–1.7)
Alpha 1: 0.2 g/dL (ref 0.0–0.4)
Alpha2 Glob SerPl Elph-Mcnc: 0.7 g/dL (ref 0.4–1.0)
B-Globulin SerPl Elph-Mcnc: 0.8 g/dL (ref 0.7–1.3)
Gamma Glob SerPl Elph-Mcnc: 0.6 g/dL (ref 0.4–1.8)
Globulin, Total: 2.3 g/dL (ref 2.2–3.9)
IgA: 243 mg/dL (ref 61–437)
IgG (Immunoglobin G), Serum: 608 mg/dL (ref 603–1613)
IgM (Immunoglobulin M), Srm: 30 mg/dL (ref 15–143)
M Protein SerPl Elph-Mcnc: 0.2 g/dL — ABNORMAL HIGH
Total Protein ELP: 6.6 g/dL (ref 6.0–8.5)

## 2018-10-30 NOTE — Progress Notes (Signed)
Subjective: Nicholas Patton is a 84 y.o. y.o. male who presents today for preventative diabetic foot care with cc of painful, discolored, thick toenails and painful interdigital corns which interfere with daily activities. Pain is aggravated when wearing enclosed shoe gear and relieved with periodic professional debridement.    Current Outpatient Medications:  .  amLODipine (NORVASC) 5 MG tablet, Take 5 mg by mouth daily., Disp: , Rfl:  .  aspirin 81 MG EC tablet, Take 81 mg by mouth daily.  , Disp: , Rfl:  .  atorvastatin (LIPITOR) 40 MG tablet, Take 1 tablet (40 mg total) by mouth daily., Disp: 90 tablet, Rfl: 1 .  cetirizine (ZYRTEC) 10 MG tablet, Take 1 tablet (10 mg total) by mouth daily., Disp: 30 tablet, Rfl: 11 .  Cholecalciferol (VITAMIN D3) 1000 UNITS CAPS, Take 1 capsule (1,000 Units total) by mouth daily., Disp: 90 capsule, Rfl: 3 .  colchicine 0.6 MG tablet, Take 2 tabs and then one hour later take, Disp: 3 tablet, Rfl: 0 .  DULoxetine (CYMBALTA) 30 MG capsule, Take 1 capsule (30 mg total) by mouth daily., Disp: 30 capsule, Rfl: 11 .  ferrous sulfate 325 (65 FE) MG tablet, Take 325 mg by mouth daily with breakfast., Disp: , Rfl:  .  gabapentin (NEURONTIN) 300 MG capsule, Take 1 capsule (300 mg total) by mouth 3 (three) times daily., Disp: 90 capsule, Rfl: 2 .  glimepiride (AMARYL) 2 MG tablet, Take 1 tablet (2 mg total) by mouth daily before breakfast., Disp: 90 tablet, Rfl: 3 .  glucose blood (ONE TOUCH ULTRA TEST) test strip, Use as instructed E11.9, Disp: 200 each, Rfl: 12 .  HYDROcodone-acetaminophen (NORCO/VICODIN) 5-325 MG tablet, Take 1 tablet by mouth every 6 (six) hours as needed for moderate pain., Disp: 30 tablet, Rfl: 0 .  lidocaine (LIDODERM) 5 %, Place 1 patch onto the skin daily. Remove & Discard patch within 12 hours or as directed by MD, Disp: 30 patch, Rfl: 1 .  losartan (COZAAR) 100 MG tablet, Take 1 tablet by mouth daily., Disp: , Rfl: 6 .  losartan (COZAAR) 50 MG  tablet, TAKE 1 TABLET BY MOUTH ONCE DAILY STOP LISINOPRIL HCTZ, Disp: , Rfl:  .  meclizine (ANTIVERT) 12.5 MG tablet, Take 1 tablet (12.5 mg total) by mouth 3 (three) times daily as needed for dizziness., Disp: 30 tablet, Rfl: 1 .  Multiple Vitamins-Minerals (MULTIVITAMIN PO), Take 1 tablet by mouth daily., Disp: , Rfl:  .  pantoprazole (PROTONIX) 40 MG tablet, Take 1 tablet (40 mg total) by mouth daily., Disp: 90 tablet, Rfl: 1 .  predniSONE (DELTASONE) 10 MG tablet, 3 tabs po qd x 3 days, then 2 tabs po qd x 3 days, then 1 tab po qd x 3 days, Disp: 18 tablet, Rfl: 0 .  tamsulosin (FLOMAX) 0.4 MG CAPS capsule, Take 1 capsule (0.4 mg total) by mouth daily., Disp: 90 capsule, Rfl: 1 .  traMADol (ULTRAM) 50 MG tablet, Take 1 tablet (50 mg total) by mouth every 6 (six) hours as needed., Disp: 30 tablet, Rfl: 0 .  triamcinolone (NASACORT) 55 MCG/ACT AERO nasal inhaler, Place 2 sprays into the nose daily., Disp: 1 Inhaler, Rfl: 12  Allergies  Allergen Reactions  . Cymbalta [Duloxetine Hcl]   . Oxycodone     Objective: Vitals:   10/19/18 1140  Temp: 97.6 F (36.4 C)    Vascular Examination: Capillary refill time immediate x 10 digits.  Dorsalis pedis pulses palpable b/l.  Posterior tibial pulses palpable  b/l.  Digital hair absent x 10 digits.  Skin temperature gradient WNL b/l.  Dermatological Examination: Skin with normal turgor, texture and tone b/l.  Toenails 1-5 b/l discolored, thick, dystrophic with subungual debris and pain with palpation to nailbeds due to thickness of nails.  Hyperkeratotic lesion lateral aspect left hallux and medial left 2nd digit. No erythema, no edema, no drainage, no flocculence noted.   Musculoskeletal: Muscle strength 5/5 to all LE muscle groups.  HAV with bunion b/l and hammertoes 2-5 b/l.  Neurological: Subjective symptoms of neuropathy.  Sensation intact 5/5 b/l with 10 gram monofilament.  Vibratory sensation intact  b/l.  Assessment: 1. Painful onychomycosis toenails 1-5 b/l 2.  Corn left hallux and left 2nd digit 3.  NIDDM  Plan: 1. Continue diabetic foot care principles. Literature dispensed on today. 2. Toenails 1-5 b/l were debrided in length and girth without iatrogenic bleeding. 3. Hyperkeratotic lesion(s) left great and left 2nd digits  pared with sterile scalpel blade without incident. 4. Patient to continue soft, supportive shoe gear daily. 5. Patient to report any pedal injuries to medical professional immediately. 6. Follow up 3 months..  7. Patient/POA to call should there be a concern in the interim.

## 2018-10-31 NOTE — Progress Notes (Signed)
Patient Care Team: [name], MD as PCP - General  DIAGNOSIS:    ICD-10-CM   1. MGUS (monoclonal gammopathy of unknown significance)  D47.2     CHIEF COMPLIANT: Follow-up of MGUS  INTERVAL HISTORY: Nicholas Patton is a 84 y.o. with above-mentioned history of MGUS. I last saw him a year ago. Bone survey on 11/09/17 showed no lesions or abnormalities suggestive of myeloma. Labs on 8/198/20 showed: WBC 3.8, Hg 14.0, creatinine 1.67, m-protein 0.2, kappa light chains 1,409, ratio 136.8. He presents to the clinic today for annual follow-up.   REVIEW OF SYSTEMS:   Constitutional: Denies fevers, chills or abnormal weight loss Eyes: Denies blurriness of vision Ears, nose, mouth, throat, and face: Denies mucositis or sore throat Respiratory: Denies cough, dyspnea or wheezes Cardiovascular: Denies palpitation, chest discomfort Gastrointestinal: Denies nausea, heartburn or change in bowel habits Skin: Denies abnormal skin rashes Lymphatics: Denies new lymphadenopathy or easy bruising Neurological: Denies numbness, tingling or new weaknesses Behavioral/Psych: Mood is stable, no new changes  Extremities: No lower extremity edema   All other systems were reviewed with the patient and are negative.  I have reviewed the past medical history, past surgical history, social history and family history with the patient and they are unchanged from previous note.  ALLERGIES:  is allergic to cymbalta [duloxetine hcl] and oxycodone.  MEDICATIONS:  Current Outpatient Medications  Medication Sig Dispense Refill  . amLODipine (NORVASC) 5 MG tablet Take 5 mg by mouth daily.    Marland Kitchen aspirin 81 MG EC tablet Take 81 mg by mouth daily.      Marland Kitchen atorvastatin (LIPITOR) 40 MG tablet Take 1 tablet (40 mg total) by mouth daily. 90 tablet 1  . cetirizine (ZYRTEC) 10 MG tablet Take 1 tablet (10 mg total) by mouth daily. 30 tablet 11  . Cholecalciferol (VITAMIN D3) 1000 UNITS CAPS Take 1 capsule (1,000 Units total) by  mouth daily. 90 capsule 3  . colchicine 0.6 MG tablet Take 2 tabs and then one hour later take 3 tablet 0  . DULoxetine (CYMBALTA) 30 MG capsule Take 1 capsule (30 mg total) by mouth daily. 30 capsule 11  . ferrous sulfate 325 (65 FE) MG tablet Take 325 mg by mouth daily with breakfast.    . gabapentin (NEURONTIN) 300 MG capsule Take 1 capsule (300 mg total) by mouth 3 (three) times daily. 90 capsule 2  . glimepiride (AMARYL) 2 MG tablet Take 1 tablet (2 mg total) by mouth daily before breakfast. 90 tablet 3  . glucose blood (ONE TOUCH ULTRA TEST) test strip Use as instructed E11.9 200 each 12  . HYDROcodone-acetaminophen (NORCO/VICODIN) 5-325 MG tablet Take 1 tablet by mouth every 6 (six) hours as needed for moderate pain. 30 tablet 0  . lidocaine (LIDODERM) 5 % Place 1 patch onto the skin daily. Remove & Discard patch within 12 hours or as directed by MD 30 patch 1  . losartan (COZAAR) 100 MG tablet Take 1 tablet by mouth daily.  6  . losartan (COZAAR) 50 MG tablet TAKE 1 TABLET BY MOUTH ONCE DAILY STOP LISINOPRIL HCTZ    . meclizine (ANTIVERT) 12.5 MG tablet Take 1 tablet (12.5 mg total) by mouth 3 (three) times daily as needed for dizziness. 30 tablet 1  . Multiple Vitamins-Minerals (MULTIVITAMIN PO) Take 1 tablet by mouth daily.    . pantoprazole (PROTONIX) 40 MG tablet Take 1 tablet (40 mg total) by mouth daily. 90 tablet 1  . predniSONE (DELTASONE) 10  MG tablet 3 tabs po qd x 3 days, then 2 tabs po qd x 3 days, then 1 tab po qd x 3 days 18 tablet 0  . tamsulosin (FLOMAX) 0.4 MG CAPS capsule Take 1 capsule (0.4 mg total) by mouth daily. 90 capsule 1  . traMADol (ULTRAM) 50 MG tablet Take 1 tablet (50 mg total) by mouth every 6 (six) hours as needed. 30 tablet 0  . triamcinolone (NASACORT) 55 MCG/ACT AERO nasal inhaler Place 2 sprays into the nose daily. 1 Inhaler 12   No current facility-administered medications for this visit.     PHYSICAL EXAMINATION: ECOG PERFORMANCE STATUS: 1 -  Symptomatic but completely ambulatory  Vitals:   11/01/18 1116  BP: (!) 155/79  Pulse: 81  Resp: 17  Temp: 98.3 F (36.8 C)  SpO2: 98%   Filed Weights   11/01/18 1116  Weight: 179 lb 11.2 oz (81.5 kg)    GENERAL: alert, no distress and comfortable SKIN: skin color, texture, turgor are normal, no rashes or significant lesions EYES: normal, Conjunctiva are pink and non-injected, sclera clear OROPHARYNX: no exudate, no erythema and lips, buccal mucosa, and tongue normal  NECK: supple, thyroid normal size, non-tender, without nodularity LYMPH: no palpable lymphadenopathy in the cervical, axillary or inguinal LUNGS: clear to auscultation and percussion with normal breathing effort HEART: regular rate & rhythm and no murmurs and no lower extremity edema ABDOMEN: abdomen soft, non-tender and normal bowel sounds MUSCULOSKELETAL: no cyanosis of digits and no clubbing  NEURO: alert & oriented x 3 with fluent speech, no focal motor/sensory deficits EXTREMITIES: No lower extremity edema  LABORATORY DATA:  I have reviewed the data as listed CMP Latest Ref Rng & Units 10/25/2018 10/04/2018 04/04/2018  Glucose 70 - 99 mg/dL 110(H) 117(H) 89  BUN 8 - 23 mg/dL 26(H) 29(H) 29(H)  Creatinine 0.61 - 1.24 mg/dL 1.67(H) 1.60(H) 1.99(H)  Sodium 135 - 145 mmol/L 143 141 139  Potassium 3.5 - 5.1 mmol/L 4.1 4.2 4.3  Chloride 98 - 111 mmol/L 107 105 102  CO2 22 - 32 mmol/L _0 Calcium 8.9 - 10.3 mg/dL 9.8 10.3 10.6(H)  Total Protein 6.5 - 8.1 g/dL 7.1 7.7 7.2  Total Bilirubin 0.3 - 1.2 mg/dL 0.5 0.6 0.5  Alkaline Phos 38 - 126 U/L 126 116 117  AST 15 - 41 U/L _1 ALT 0 - 44 U/L _2 Lab Results  Component Value Date   WBC 3.8 (L) 10/25/2018   HGB 14.0 10/25/2018   HCT 43.9 10/25/2018   MCV 93.2 10/25/2018   PLT 162 10/25/2018   NEUTROABS 2.3 10/25/2018    ASSESSMENT & PLAN:  MGUS (monoclonal gammopathy of unknown significance) Blood work review 09/20/2017: M protein 0.2  g (IgA kappa by immunofixation) Quantitative immunoglobulins: IgG 569, IgA 219, IgM 28 Serum creatinine 1.73, albumin 4.3, calcium 9.9, hemoglobin 12.5 Bone survey 11/09/2017: Benign  10/25/2018: M protein 0.2 g (same as before) IgA kappa Serum free kappa light chain: 1409, KL ratio 136.8 (these labs were not done previously) Hemoglobin 14  I spoke to the patient's daughter and provided them with the copies of the labs. Return to clinic in 1 year with labs done 1 week ahead of time and follow-up.  No orders of the defined types were placed in this encounter.  The patient has a good understanding of the overall plan. he agrees with it. he will call with any problems that may develop before  the next visit here.  Nicholas Lose, MD 11/01/2018  Julious Oka Dorshimer am acting as scribe for Dr. Nicholas Lose.  I have reviewed the above documentation for accuracy and completeness, and I agree with the above.

## 2018-11-01 ENCOUNTER — Other Ambulatory Visit: Payer: Self-pay

## 2018-11-01 ENCOUNTER — Inpatient Hospital Stay (HOSPITAL_BASED_OUTPATIENT_CLINIC_OR_DEPARTMENT_OTHER): Payer: Medicare Other | Admitting: Hematology and Oncology

## 2018-11-01 DIAGNOSIS — D472 Monoclonal gammopathy: Secondary | ICD-10-CM | POA: Diagnosis not present

## 2018-11-01 DIAGNOSIS — Z7984 Long term (current) use of oral hypoglycemic drugs: Secondary | ICD-10-CM | POA: Diagnosis not present

## 2018-11-01 DIAGNOSIS — Z79899 Other long term (current) drug therapy: Secondary | ICD-10-CM | POA: Diagnosis not present

## 2018-11-01 DIAGNOSIS — Z7982 Long term (current) use of aspirin: Secondary | ICD-10-CM | POA: Diagnosis not present

## 2018-11-02 ENCOUNTER — Telehealth: Payer: Self-pay | Admitting: Hematology and Oncology

## 2018-11-02 NOTE — Telephone Encounter (Signed)
I left a message regarding schedule  

## 2018-11-06 ENCOUNTER — Other Ambulatory Visit: Payer: Self-pay

## 2018-11-06 ENCOUNTER — Encounter (HOSPITAL_COMMUNITY): Payer: Self-pay

## 2018-11-06 ENCOUNTER — Emergency Department (HOSPITAL_COMMUNITY)
Admission: EM | Admit: 2018-11-06 | Discharge: 2018-11-06 | Disposition: A | Discharge status: Home / Self Care | Payer: Medicare Other | Attending: Emergency Medicine | Admitting: Emergency Medicine

## 2018-11-06 DIAGNOSIS — Z7982 Long term (current) use of aspirin: Secondary | ICD-10-CM | POA: Insufficient documentation

## 2018-11-06 DIAGNOSIS — Z87891 Personal history of nicotine dependence: Secondary | ICD-10-CM | POA: Diagnosis not present

## 2018-11-06 DIAGNOSIS — R51 Headache: Secondary | ICD-10-CM | POA: Diagnosis not present

## 2018-11-06 DIAGNOSIS — I129 Hypertensive chronic kidney disease with stage 1 through stage 4 chronic kidney disease, or unspecified chronic kidney disease: Secondary | ICD-10-CM | POA: Diagnosis not present

## 2018-11-06 DIAGNOSIS — E119 Type 2 diabetes mellitus without complications: Secondary | ICD-10-CM | POA: Diagnosis not present

## 2018-11-06 DIAGNOSIS — I160 Hypertensive urgency: Secondary | ICD-10-CM | POA: Diagnosis not present

## 2018-11-06 DIAGNOSIS — N183 Chronic kidney disease, stage 3 (moderate): Secondary | ICD-10-CM | POA: Insufficient documentation

## 2018-11-06 DIAGNOSIS — Z79899 Other long term (current) drug therapy: Secondary | ICD-10-CM | POA: Insufficient documentation

## 2018-11-06 HISTORY — DX: Disorder of kidney and ureter, unspecified: N28.9

## 2018-11-06 LAB — BASIC METABOLIC PANEL
Anion gap: 8 (ref 5–15)
BUN: 22 mg/dL (ref 8–23)
CO2: 27 mmol/L (ref 22–32)
Calcium: 9.5 mg/dL (ref 8.9–10.3)
Chloride: 102 mmol/L (ref 98–111)
Creatinine, Ser: 1.55 mg/dL — ABNORMAL HIGH (ref 0.61–1.24)
GFR calc Af Amer: 48 mL/min — ABNORMAL LOW (ref >60)
GFR calc non Af Amer: 41 mL/min — ABNORMAL LOW (ref >60)
Glucose, Bld: 182 mg/dL — ABNORMAL HIGH (ref 70–99)
Potassium: 3.9 mmol/L (ref 3.5–5.1)
Sodium: 137 mmol/L (ref 135–145)

## 2018-11-06 LAB — CBC
HCT: 44.2 % (ref 39.0–52.0)
Hemoglobin: 14.1 g/dL (ref 13.0–17.0)
MCH: 29.9 pg (ref 26.0–34.0)
MCHC: 31.9 g/dL (ref 30.0–36.0)
MCV: 93.6 fL (ref 80.0–100.0)
Platelets: 149 10*3/uL — ABNORMAL LOW (ref 150–400)
RBC: 4.72 MIL/uL (ref 4.22–5.81)
RDW: 13.6 % (ref 11.5–15.5)
WBC: 3.3 10*3/uL — ABNORMAL LOW (ref 4.0–10.5)
nRBC: 0 % (ref 0.0–0.2)

## 2018-11-06 MED ORDER — ACETAMINOPHEN 500 MG PO TABS
500.0000 mg | ORAL_TABLET | Freq: Once | ORAL | Status: AC
  Administered 2018-11-06: 15:00:00 500 mg via ORAL
  Filled 2018-11-06: qty 1

## 2018-11-06 NOTE — ED Triage Notes (Signed)
Per pt, states his kidney MD wanted him to take BP twice daily-states it was running high this am-no symptoms, no pain

## 2018-11-06 NOTE — Discharge Instructions (Addendum)
As discussed, your evaluation today has been largely reassuring.  But, it is important that you monitor your condition carefully, and do not hesitate to return to the ED if you develop new, or concerning changes in your condition.  Otherwise, please follow-up with your physician for appropriate ongoing care.

## 2018-11-06 NOTE — ED Provider Notes (Signed)
[facility] DEPT Provider Note   CSN: [id number] Arrival date & time: 11/06/18  1322     History   Chief Complaint Chief Complaint  Patient presents with  . Hypertension    HPI Nicholas Patton is a 84 y.o. male.     HPI Patient presents with concern of hypertension and headache. He has a history of CKD, hypertension, and headaches. He notes that he has had a typical headache for him, today, left-sided, sore, but while taking his blood pressure today, is found multiple elevated values, with a systolic greater than 295. No new urinary complaints, no new dyspnea, no chest pain, no nausea, vomiting, diarrhea. He is here with his son who assists with the HPI. Patient has CKD, he is being monitored for progression. Patient takes losartan as his sole antihypertensive agent, having stopped his amlodipine sometime ago due to peripheral edema.  Past Medical History:  Diagnosis Date  . Anemia   . Arthritis   . Cataract    REMOVED BILATERAL  . Colon polyps   . DIABETES MELLITUS, TYPE II    takes Metformin and Glimepiride daily  . Diverticulosis   . GERD (gastroesophageal reflux disease)    takes Protonix daily  . History of colon polyps   . HYPERLIPIDEMIA    takes Atorvastatin daily  . HYPERTENSION    takes Amlodipine and Hyzaar daily  . Ileus following gastrointestinal surgery ([place]) 07/22/2012  . Joint pain   . Peripheral neuropathy   . Peripheral neuropathy   . PERIPHERAL NEUROPATHY, FEET 10/06/2007  . Renal disorder   . VITAMIN B12 DEFICIENCY 09/06/2008  . VITAMIN D DEFICIENCY 01/16/2010   takes Vitamin D daily    Patient Active Problem List   Diagnosis Date Noted  . Abnormal TSH 10/04/2018  . Headache 10/04/2018  . PHN (postherpetic neuralgia) 04/04/2018  . Shingles 03/11/2018  . Acute gout due to renal impairment involving toe of left foot 02/02/2018  . Eustachian tube dysfunction, left 12/06/2017  . MGUS (monoclonal gammopathy of  unknown significance) 10/28/2017  . Elevated blood protein 10/08/2017  . Peripheral edema 09/17/2017  . Anemia, iron deficiency 04/10/2016  . Left-sided nosebleed 01/13/2016  . Hematochezia 11/21/2015  . Peripheral neuropathic pain 10/25/2015  . Low back pain 10/08/2015  . CKD (chronic kidney disease) stage 3, GFR 30-59 ml/min (HCC) 10/08/2015  . S/P cervical spinal fusion 06/27/2015  . Increased prostate specific antigen (PSA) velocity 04/09/2015  . Chest pain 08/14/2014  . Dyspnea 08/14/2014  . Urinary frequency 06/19/2014  . Overactive bladder 09/27/2013  . Onychomycosis 08/08/2013  . Pain in lower limb 05/15/2013  . Diabetic peripheral neuropathy ([place]) 05/15/2013  . Other hammer toe (acquired) 05/15/2013  . HAV (hallux abducto valgus) 05/15/2013  . Blood in mouth of unknown source 02/09/2013  . Vertigo 12/29/2012  . Acute upper respiratory infection 12/29/2012  . Erectile dysfunction 09/22/2012  . Left ear hearing loss 09/13/2012  . S/P laparoscopic cholecystectomy 08/03/2012  . Neck pain on left side 04/04/2012  . Trapezoid ligament sprain 04/04/2012  . Bladder neck obstruction 10/06/2011  . Cervical radiculitis 09/26/2010  . Preventative health care 09/14/2010  . VITAMIN D DEFICIENCY 01/16/2010  . PERS HX TOBACCO USE PRESENTING HAZARDS HEALTH 09/17/2009  . VITAMIN B12 DEFICIENCY 09/06/2008  . INSOMNIA 06/21/2008  . LEG PAIN, BILATERAL 10/06/2007  . Diabetes ([place]) 01/24/2007  . Hyperlipidemia 01/24/2007  . Allergic rhinitis 01/24/2007  . BPH (benign prostatic hypertrophy) 01/24/2007  . Essential hypertension 10/05/2006  .  GERD 10/05/2006    Past Surgical History:  Procedure Laterality Date  . CATARACT EXTRACTION     both eyes  . CHOLECYSTECTOMY N/A 07/18/2012   Procedure: LAPAROSCOPIC CHOLECYSTECTOMY WITH INTRAOPERATIVE CHOLANGIOGRAM;  Surgeon: Zenovia Jarred, MD;  Location: [facility];  Service: General;  Laterality: N/A;  . COLONOSCOPY    . POLYPECTOMY    .  POSTERIOR CERVICAL FUSION/FORAMINOTOMY N/A 06/27/2015   Procedure: Posterior Cervical Fusion with lateral mass fixation Cervical four to cervical seven;  Surgeon: Eustace Moore, MD;  Location: [facility];  Service: Neurosurgery;  Laterality: N/A;  . TOTAL KNEE ARTHROPLASTY  2001   Left  . TURP VAPORIZATION          Home Medications    Prior to Admission medications   Medication Sig Start Date End Date Taking? Authorizing Provider  amLODipine (NORVASC) 5 MG tablet Take 5 mg by mouth daily. 07/05/18   [provider]  aspirin 81 MG EC tablet Take 81 mg by mouth daily.      [provider]  atorvastatin (LIPITOR) 40 MG tablet Take 1 tablet (40 mg total) by mouth daily. 08/16/18   [name], MD  cetirizine (ZYRTEC) 10 MG tablet Take 1 tablet (10 mg total) by mouth daily. 10/06/18 10/06/19  [name], MD  Cholecalciferol (VITAMIN D3) 1000 UNITS CAPS Take 1 capsule (1,000 Units total) by mouth daily. 03/22/13   [name], MD  colchicine 0.6 MG tablet Take 2 tabs and then one hour later take 02/02/18   Binnie Rail, MD  DULoxetine (CYMBALTA) 30 MG capsule Take 1 capsule (30 mg total) by mouth daily. 04/04/18   [name], MD  ferrous sulfate 325 (65 FE) MG tablet Take 325 mg by mouth daily with breakfast.    [provider]  gabapentin (NEURONTIN) 300 MG capsule Take 1 capsule (300 mg total) by mouth 3 (three) times daily. 03/11/18   [name], MD  glimepiride (AMARYL) 2 MG tablet Take 1 tablet (2 mg total) by mouth daily before breakfast. 12/27/17   Marrian Salvage, FNP  glucose blood (ONE TOUCH ULTRA TEST) test strip Use as instructed E11.9 09/17/17   [name], MD  HYDROcodone-acetaminophen (NORCO/VICODIN) 5-325 MG tablet Take 1 tablet by mouth every 6 (six) hours as needed for moderate pain. 04/04/18   [name], MD  lidocaine (LIDODERM) 5 % Place 1 patch onto the skin daily. Remove & Discard patch within 12 hours or as directed by MD  04/04/18   [name], MD  losartan (COZAAR) 100 MG tablet Take 1 tablet by mouth daily. 08/04/17   [provider]  losartan (COZAAR) 50 MG tablet Take 50 mg by mouth daily.  07/05/18   [provider]  meclizine (ANTIVERT) 12.5 MG tablet Take 1 tablet (12.5 mg total) by mouth 3 (three) times daily as needed for dizziness. 12/06/17 12/06/18  [name], MD  Multiple Vitamins-Minerals (MULTIVITAMIN PO) Take 1 tablet by mouth daily.    [provider]  pantoprazole (PROTONIX) 40 MG tablet Take 1 tablet (40 mg total) by mouth daily. 08/16/18   [name], MD  predniSONE (DELTASONE) 10 MG tablet 3 tabs po qd x 3 days, then 2 tabs po qd x 3 days, then 1 tab po qd x 3 days 02/02/18   Binnie Rail, MD  tamsulosin (FLOMAX) 0.4 MG CAPS capsule Take 1 capsule (0.4 mg total) by mouth daily. 08/16/18  [name], MD  traMADol (ULTRAM) 50 MG tablet Take 1 tablet (50 mg total) by mouth every 6 (six) hours as needed. 10/06/18   [name], MD  triamcinolone (NASACORT) 55 MCG/ACT AERO nasal inhaler Place 2 sprays into the nose daily. 10/06/18   [name], MD    Family History Family History  Problem Relation Age of Onset  . Diabetes Mellitus II Mother   . Lung cancer Brother   . Colon cancer Neg Hx   . Esophageal cancer Neg Hx   . Rectal cancer Neg Hx   . Stomach cancer Neg Hx     Social History Social History   Tobacco Use  . Smoking status: Former Smoker    Packs/day: 0.00    Types: Cigarettes  . Smokeless tobacco: Never Used  . Tobacco comment: quit smoking around 1990  Substance Use Topics  . Alcohol use: No    Alcohol/week: 0.0 standard drinks  . Drug use: No     Allergies   Cymbalta [duloxetine hcl] and Oxycodone   Review of Systems Review of Systems  Constitutional:       Per HPI, otherwise negative  HENT:       Per HPI, otherwise negative  Respiratory:       Per HPI, otherwise negative  Cardiovascular:       Per HPI, otherwise  negative  Gastrointestinal: Negative for vomiting.  Endocrine:       Negative aside from HPI  Genitourinary:       Neg aside from HPI   Musculoskeletal:       Per HPI, otherwise negative  Skin: Negative.   Neurological: Negative for syncope.     Physical Exam Updated Vital Signs BP (!) 161/75   Pulse 77   Temp 98.4 F (36.9 C) (Oral)   Resp 16   SpO2 98%   Physical Exam Vitals signs and nursing note reviewed.  Constitutional:      General: He is not in acute distress.    Appearance: He is well-developed.  HENT:     Head: Normocephalic and atraumatic.  Eyes:     Conjunctiva/sclera: Conjunctivae normal.  Cardiovascular:     Rate and Rhythm: Normal rate and regular rhythm.  Pulmonary:     Effort: Pulmonary effort is normal. No respiratory distress.     Breath sounds: No stridor.  Abdominal:     General: There is no distension.  Skin:    General: Skin is warm and dry.  Neurological:     Mental Status: He is alert and oriented to person, place, and time.      ED Treatments / Results  Labs (all labs ordered are listed, but only abnormal results are displayed) Labs Reviewed  BASIC METABOLIC PANEL - Abnormal; Notable for the following components:      Result Value   Glucose, Bld 182 (*)    Creatinine, Ser 1.55 (*)    GFR calc non Af Amer 41 (*)    GFR calc Af Amer 48 (*)    All other components within normal limits  CBC - Abnormal; Notable for the following components:   WBC 3.3 (*)    Platelets 149 (*)    All other components within normal limits    EKG None  Radiology No results found.  Procedures Procedures (including critical care time)  Medications Ordered in ED Medications - No data to display   Initial Impression / Assessment and Plan / ED Course  I  have reviewed the triage vital signs and the nursing notes.  Pertinent labs & imaging results that were available during my care of the patient were reviewed by me and considered in my medical  decision making (see chart for details).        3:13 PM Labs reassuring, potassium unremarkable, creatinine slightly down from most recent study. Patient does have mild hypertension, but no evidence for endorgan effects, no distress, and there is no indication for additional medications.  The patient has a physician with whom we will follow-up closely, for ongoing monitoring of his renal function. Absent other complaints, the patient is appropriate for discharge with close outpatient follow-up.  Final Clinical Impressions(s) / ED Diagnoses   Final diagnoses:  Hypertensive urgency     Carmin Muskrat, MD 11/06/18 [phone]

## 2018-11-07 ENCOUNTER — Ambulatory Visit
Admission: RE | Admit: 2018-11-07 | Discharge: 2018-11-07 | Disposition: A | Discharge status: Home / Self Care | Payer: Medicare Other | Source: Ambulatory Visit | Attending: Internal Medicine | Admitting: Internal Medicine

## 2018-11-07 DIAGNOSIS — G4489 Other headache syndrome: Secondary | ICD-10-CM

## 2018-11-07 DIAGNOSIS — R51 Headache: Secondary | ICD-10-CM | POA: Diagnosis not present

## 2018-11-09 ENCOUNTER — Other Ambulatory Visit: Payer: Self-pay

## 2018-11-09 ENCOUNTER — Encounter: Payer: Self-pay | Admitting: Internal Medicine

## 2018-11-09 ENCOUNTER — Ambulatory Visit (INDEPENDENT_AMBULATORY_CARE_PROVIDER_SITE_OTHER): Payer: Medicare Other | Admitting: Internal Medicine

## 2018-11-09 VITALS — BP 138/84 | HR 76 | Temp 98.0°F | Ht 72.0 in | Wt 180.0 lb

## 2018-11-09 DIAGNOSIS — Z23 Encounter for immunization: Secondary | ICD-10-CM

## 2018-11-09 DIAGNOSIS — N183 Chronic kidney disease, stage 3 unspecified: Secondary | ICD-10-CM

## 2018-11-09 DIAGNOSIS — I10 Essential (primary) hypertension: Secondary | ICD-10-CM

## 2018-11-09 DIAGNOSIS — E1140 Type 2 diabetes mellitus with diabetic neuropathy, unspecified: Secondary | ICD-10-CM | POA: Diagnosis not present

## 2018-11-09 DIAGNOSIS — I129 Hypertensive chronic kidney disease with stage 1 through stage 4 chronic kidney disease, or unspecified chronic kidney disease: Secondary | ICD-10-CM

## 2018-11-09 MED ORDER — CARVEDILOL 3.125 MG PO TABS: 3.1250 mg | ORAL_TABLET | Freq: Two times a day (BID) | ORAL | 3 refills | Status: DC

## 2018-11-09 NOTE — Assessment & Plan Note (Signed)
Uncontrolled overall, to add coreg 2.125 bid,  to f/u any worsening symptoms or concerns, fu BP at home as he des

## 2018-11-09 NOTE — Patient Instructions (Signed)
.  Please take all new medication as prescribed - the low dose coreg (carvedilol) at 3.125 mg twice per day  Please continue to monitor you blood pressures as you do, and call in 2-3 wks if the top number is still more than 140/90 to consider an increased dose to 6.25 mg twice per day  Please continue all other medications as before, and refills have been done if requested.  Please have the pharmacy call with any other refills you may need.  Please continue your efforts at being more active, low cholesterol diet, and weight control.  Please keep your appointments with your specialists as you may have planned

## 2018-11-09 NOTE — Progress Notes (Signed)
Subjective:    Patient ID: Nicholas Patton, male    DOB: [birth year], 84 y.o.   MRN: [id number]  HPI   Here to f/u; overall doing ok,  Pt denies chest pain, increasing sob or doe, wheezing, orthopnea, PND, increased LE swelling, palpitations, dizziness or syncope.  Pt denies new neurological symptoms such as new headache, or facial or extremity weakness or numbness.  Pt denies polydipsia, polyuria, or low sugar episode.  Pt states overall good compliance with meds, mostly trying to follow appropriate diet/.  Saw renal with elevated BP, so has been checking BP twice per day and persistently mild elevated at home as well in the 140's to 150.  Pt thinks may be increased overall due to having to stop the amlodipine due to leg sweling, that has now happened several times in the past.  Highest Bp recently has been Sbp 180.  Pulse has been upt o 100 several times recently as well at home, most recently 61.   BP Readings from Last 3 Encounters:  11/09/18 138/84  11/06/18 (!) 161/75  11/01/18 (!) 155/79  Seen in ED but EDP asked pt to f/u here to allow for continuity of care  Recent head CT neg for acute.  Remains occasional HA, takes tramadol prn.   Past Medical History:  Diagnosis Date  . Anemia   . Arthritis   . Cataract    REMOVED BILATERAL  . Colon polyps   . DIABETES MELLITUS, TYPE II    takes Metformin and Glimepiride daily  . Diverticulosis   . GERD (gastroesophageal reflux disease)    takes Protonix daily  . History of colon polyps   . HYPERLIPIDEMIA    takes Atorvastatin daily  . HYPERTENSION    takes Amlodipine and Hyzaar daily  . Ileus following gastrointestinal surgery ([place]) 07/22/2012  . Joint pain   . Peripheral neuropathy   . Peripheral neuropathy   . PERIPHERAL NEUROPATHY, FEET 10/06/2007  . Renal disorder   . VITAMIN B12 DEFICIENCY 09/06/2008  . VITAMIN D DEFICIENCY 01/16/2010   takes Vitamin D daily   Past Surgical History:  Procedure Laterality Date  . CATARACT  EXTRACTION     both eyes  . CHOLECYSTECTOMY N/A 07/18/2012   Procedure: LAPAROSCOPIC CHOLECYSTECTOMY WITH INTRAOPERATIVE CHOLANGIOGRAM;  Surgeon: Zenovia Jarred, MD;  Location: [facility];  Service: General;  Laterality: N/A;  . COLONOSCOPY    . POLYPECTOMY    . POSTERIOR CERVICAL FUSION/FORAMINOTOMY N/A 06/27/2015   Procedure: Posterior Cervical Fusion with lateral mass fixation Cervical four to cervical seven;  Surgeon: Eustace Moore, MD;  Location: [facility];  Service: Neurosurgery;  Laterality: N/A;  . TOTAL KNEE ARTHROPLASTY  2001   Left  . TURP VAPORIZATION      reports that he has quit smoking. His smoking use included cigarettes. He smoked 0.00 packs per day. He has never used smokeless tobacco. He reports that he does not drink alcohol or use drugs. family history includes Diabetes Mellitus II in his mother; Lung cancer in his brother. Allergies  Allergen Reactions  . Amlodipine Swelling  . Cymbalta [Duloxetine Hcl]   . Oxycodone    Current Outpatient Medications on File Prior to Visit  Medication Sig Dispense Refill  . aspirin 81 MG EC tablet Take 81 mg by mouth daily.      Marland Kitchen atorvastatin (LIPITOR) 40 MG tablet Take 1 tablet (40 mg total) by mouth daily. 90 tablet 1  . cetirizine (ZYRTEC) 10 MG tablet Take  1 tablet (10 mg total) by mouth daily. 30 tablet 11  . Cholecalciferol (VITAMIN D3) 1000 UNITS CAPS Take 1 capsule (1,000 Units total) by mouth daily. 90 capsule 3  . glimepiride (AMARYL) 2 MG tablet Take 1 tablet (2 mg total) by mouth daily before breakfast. 90 tablet 3  . glucose blood (ONE TOUCH ULTRA TEST) test strip Use as instructed E11.9 200 each 12  . HYDROcodone-acetaminophen (NORCO/VICODIN) 5-325 MG tablet Take 1 tablet by mouth every 6 (six) hours as needed for moderate pain. 30 tablet 0  . lidocaine (LIDODERM) 5 % Place 1 patch onto the skin daily. Remove & Discard patch within 12 hours or as directed by MD 30 patch 1  . losartan (COZAAR) 100 MG tablet Take 1  tablet by mouth daily.  6  . Multiple Vitamins-Minerals (MULTIVITAMIN PO) Take 1 tablet by mouth daily.    . pantoprazole (PROTONIX) 40 MG tablet Take 1 tablet (40 mg total) by mouth daily. 90 tablet 1  . tamsulosin (FLOMAX) 0.4 MG CAPS capsule Take 1 capsule (0.4 mg total) by mouth daily. 90 capsule 1  . traMADol (ULTRAM) 50 MG tablet Take 1 tablet (50 mg total) by mouth every 6 (six) hours as needed. 30 tablet 0  . triamcinolone (NASACORT) 55 MCG/ACT AERO nasal inhaler Place 2 sprays into the nose daily. 1 Inhaler 12   No current facility-administered medications on file prior to visit.    Review of Systems  Constitutional: Negative for other unusual diaphoresis or sweats HENT: Negative for ear discharge or swelling Eyes: Negative for other worsening visual disturbances Respiratory: Negative for stridor or other swelling  Gastrointestinal: Negative for worsening distension or other blood Genitourinary: Negative for retention or other urinary change Musculoskeletal: Negative for other MSK pain or swelling Skin: Negative for color change or other new lesions Neurological: Negative for worsening tremors and other numbness  Psychiatric/Behavioral: Negative for worsening agitation or other fatigue All other system neg per pt    Objective:   Physical Exam BP 138/84   Pulse 76   Temp 98 F (36.7 C) (Oral)   Ht 6' (1.829 m)   Wt 180 lb (81.6 kg)   SpO2 98%   BMI 24.41 kg/m  VS noted,  Constitutional: Pt appears in NAD HENT: Head: NCAT.  Right Ear: External ear normal.  Left Ear: External ear normal.  Eyes: . Pupils are equal, round, and reactive to light. Conjunctivae and EOM are normal Nose: without d/c or deformity Neck: Neck supple. Gross normal ROM Cardiovascular: Normal rate and regular rhythm.   Pulmonary/Chest: Effort normal and breath sounds without rales or wheezing.  Abd:  Soft, NT, ND, + BS, no organomegaly Neurological: Pt is alert. At baseline orientation, motor  grossly intact Skin: Skin is warm. No rashes, other new lesions, no LE edema Psychiatric: Pt behavior is normal without agitation  No other exam findings  Lab Results  Component Value Date   WBC 3.3 (L) 11/06/2018   HGB 14.1 11/06/2018   HCT 44.2 11/06/2018   PLT 149 (L) 11/06/2018   GLUCOSE 182 (H) 11/06/2018   CHOL 138 10/04/2018   TRIG 70.0 10/04/2018   HDL 65.50 10/04/2018   LDLCALC 59 10/04/2018   ALT 22 10/25/2018   AST 20 10/25/2018   NA 137 11/06/2018   K 3.9 11/06/2018   CL 102 11/06/2018   CREATININE 1.55 (H) 11/06/2018   BUN 22 11/06/2018   CO2 27 11/06/2018   TSH 5.59 (H) 04/04/2018   PSA  4.61 (H) 04/09/2017   INR 1.12 06/19/2015   HGBA1C 6.8 (H) 10/04/2018   MICROALBUR <0.7 04/04/2018      Assessment & Plan:

## 2018-11-09 NOTE — Assessment & Plan Note (Signed)
stable overall by history and exam, recent data reviewed with pt, and pt to continue medical treatment as before,  to f/u any worsening symptoms or concerns  

## 2018-11-24 ENCOUNTER — Other Ambulatory Visit: Payer: Self-pay | Admitting: Family

## 2018-11-27 ENCOUNTER — Encounter: Payer: Self-pay | Admitting: Internal Medicine

## 2018-11-28 MED ORDER — LOSARTAN POTASSIUM 100 MG PO TABS: 100.0000 mg | ORAL_TABLET | Freq: Every day | ORAL | 1 refills | Status: DC

## 2018-11-28 MED ORDER — GLIMEPIRIDE 2 MG PO TABS: ORAL_TABLET | ORAL | 1 refills | Status: DC

## 2018-12-19 ENCOUNTER — Telehealth: Payer: Self-pay | Admitting: Internal Medicine

## 2018-12-19 MED ORDER — CARVEDILOL 6.25 MG PO TABS: 6.2500 mg | ORAL_TABLET | Freq: Two times a day (BID) | ORAL | 3 refills | Status: DC

## 2018-12-19 NOTE — Telephone Encounter (Signed)
Patient calling and states that he was seen on 11/09/2018 for BP issues. States that Dr [name] started him on a medication and told him to call back in a few weeks and let him know what his BP has been running. States that this morning his blood pressure was 160/75. Denies any new symptoms. Patient would like to know what Dr [name] would like for him to do since the blood pressure has not gotten below the 140 mark? Please advise.  CB#: [phone]

## 2018-12-19 NOTE — Telephone Encounter (Signed)
Ok to increase the coreg to 6.25 mg bid - done [facility], and call in 2-3 wks with further BP measure, AND HR if possible

## 2018-12-20 ENCOUNTER — Other Ambulatory Visit: Payer: Self-pay | Admitting: Internal Medicine

## 2018-12-20 MED ORDER — CARVEDILOL 6.25 MG PO TABS: 6.2500 mg | ORAL_TABLET | Freq: Two times a day (BID) | ORAL | 2 refills | Status: DC

## 2018-12-20 NOTE — Telephone Encounter (Signed)
Pt has been informed. 30 day supply sent to local pharmacy.

## 2018-12-28 ENCOUNTER — Other Ambulatory Visit: Payer: Self-pay | Admitting: Internal Medicine

## 2018-12-28 MED ORDER — TRAMADOL HCL 50 MG PO TABS: 50.0000 mg | ORAL_TABLET | Freq: Four times a day (QID) | ORAL | 1 refills | Status: DC | PRN

## 2019-01-02 DIAGNOSIS — R972 Elevated prostate specific antigen [PSA]: Secondary | ICD-10-CM | POA: Diagnosis not present

## 2019-01-09 DIAGNOSIS — N401 Enlarged prostate with lower urinary tract symptoms: Secondary | ICD-10-CM | POA: Diagnosis not present

## 2019-01-09 DIAGNOSIS — R972 Elevated prostate specific antigen [PSA]: Secondary | ICD-10-CM | POA: Diagnosis not present

## 2019-01-09 DIAGNOSIS — R351 Nocturia: Secondary | ICD-10-CM | POA: Diagnosis not present

## 2019-01-18 ENCOUNTER — Other Ambulatory Visit: Payer: Self-pay

## 2019-01-18 ENCOUNTER — Ambulatory Visit (INDEPENDENT_AMBULATORY_CARE_PROVIDER_SITE_OTHER): Payer: Medicare Other | Admitting: Podiatry

## 2019-01-18 ENCOUNTER — Encounter: Payer: Self-pay | Admitting: Podiatry

## 2019-01-18 DIAGNOSIS — B351 Tinea unguium: Secondary | ICD-10-CM

## 2019-01-18 DIAGNOSIS — M79674 Pain in right toe(s): Secondary | ICD-10-CM | POA: Diagnosis not present

## 2019-01-18 DIAGNOSIS — E119 Type 2 diabetes mellitus without complications: Secondary | ICD-10-CM

## 2019-01-18 DIAGNOSIS — M79675 Pain in left toe(s): Secondary | ICD-10-CM

## 2019-01-18 DIAGNOSIS — L84 Corns and callosities: Secondary | ICD-10-CM

## 2019-01-18 NOTE — Patient Instructions (Signed)
Diabetes Mellitus and Foot Care Foot care is an important part of your health, especially when you have diabetes. Diabetes may cause you to have problems because of poor blood flow (circulation) to your feet and legs, which can cause your skin to:  Become thinner and drier.  Break more easily.  Heal more slowly.  Peel and crack. You may also have nerve damage (neuropathy) in your legs and feet, causing decreased feeling in them. This means that you may not notice minor injuries to your feet that could lead to more serious problems. Noticing and addressing any potential problems early is the best way to prevent future foot problems. How to care for your feet Foot hygiene  Wash your feet daily with warm water and mild soap. Do not use hot water. Then, pat your feet and the areas between your toes until they are completely dry. Do not soak your feet as this can dry your skin.  Trim your toenails straight across. Do not dig under them or around the cuticle. File the edges of your nails with an emery board or nail file.  Apply a moisturizing lotion or petroleum jelly to the skin on your feet and to dry, brittle toenails. Use lotion that does not contain alcohol and is unscented. Do not apply lotion between your toes. Shoes and socks  Wear clean socks or stockings every day. Make sure they are not too tight. Do not wear knee-high stockings since they may decrease blood flow to your legs.  Wear shoes that fit properly and have enough cushioning. Always look in your shoes before you put them on to be sure there are no objects inside.  To break in new shoes, wear them for just a few hours a day. This prevents injuries on your feet. Wounds, scrapes, corns, and calluses  Check your feet daily for blisters, cuts, bruises, sores, and redness. If you cannot see the bottom of your feet, use a mirror or ask someone for help.  Do not cut corns or calluses or try to remove them with medicine.  If you  find a minor scrape, cut, or break in the skin on your feet, keep it and the skin around it clean and dry. You may clean these areas with mild soap and water. Do not clean the area with peroxide, alcohol, or iodine.  If you have a wound, scrape, corn, or callus on your foot, look at it several times a day to make sure it is healing and not infected. Check for: ? Redness, swelling, or pain. ? Fluid or blood. ? Warmth. ? Pus or a bad smell. General instructions  Do not cross your legs. This may decrease blood flow to your feet.  Do not use heating pads or hot water bottles on your feet. They may burn your skin. If you have lost feeling in your feet or legs, you may not know this is happening until it is too late.  Protect your feet from hot and cold by wearing shoes, such as at the beach or on hot pavement.  Schedule a complete foot exam at least once a year (annually) or more often if you have foot problems. If you have foot problems, report any cuts, sores, or bruises to your health care provider immediately. Contact a health care provider if:  You have a medical condition that increases your risk of infection and you have any cuts, sores, or bruises on your feet.  You have an injury that is not   healing.  You have redness on your legs or feet.  You feel burning or tingling in your legs or feet.  You have pain or cramps in your legs and feet.  Your legs or feet are numb.  Your feet always feel cold.  You have pain around a toenail. Get help right away if:  You have a wound, scrape, corn, or callus on your foot and: ? You have pain, swelling, or redness that gets worse. ? You have fluid or blood coming from the wound, scrape, corn, or callus. ? Your wound, scrape, corn, or callus feels warm to the touch. ? You have pus or a bad smell coming from the wound, scrape, corn, or callus. ? You have a fever. ? You have a red line going up your leg. Summary  Check your feet every day  for cuts, sores, red spots, swelling, and blisters.  Moisturize feet and legs daily.  Wear shoes that fit properly and have enough cushioning.  If you have foot problems, report any cuts, sores, or bruises to your health care provider immediately.  Schedule a complete foot exam at least once a year (annually) or more often if you have foot problems. This information is not intended to replace advice given to you by your health care provider. Make sure you discuss any questions you have with your health care provider. Document Released: 02/21/2000 Document Revised: 04/07/2017 Document Reviewed: 03/27/2016 Elsevier Patient Education  2020 [employer].

## 2019-01-25 NOTE — Progress Notes (Signed)
Subjective: Nicholas Patton is seen today for diabetic foot care follow up painful interdigital corns and elongated, thickened toenails 1-5 b/l feet that he cannot cut. Pain interferes with daily activities. Aggravating factor includes wearing enclosed shoe gear and relieved with periodic debridement.  He voices no new pedal concerns on today's visit.  Current Outpatient Medications on File Prior to Visit  Medication Sig  . aspirin 81 MG EC tablet Take 81 mg by mouth daily.    Marland Kitchen atorvastatin (LIPITOR) 40 MG tablet TAKE 1 TABLET BY MOUTH  DAILY  . carvedilol (COREG) 6.25 MG tablet Take 1 tablet (6.25 mg total) by mouth 2 (two) times daily with a meal.  . cetirizine (ZYRTEC) 10 MG tablet Take 1 tablet (10 mg total) by mouth daily.  . Cholecalciferol (VITAMIN D3) 1000 UNITS CAPS Take 1 capsule (1,000 Units total) by mouth daily.  Marland Kitchen glimepiride (AMARYL) 2 MG tablet TAKE 1 TABLET BY MOUTH  DAILY BEFORE BREAKFAST  . glucose blood (ONE TOUCH ULTRA TEST) test strip Use as instructed E11.9  . HYDROcodone-acetaminophen (NORCO/VICODIN) 5-325 MG tablet Take 1 tablet by mouth every 6 (six) hours as needed for moderate pain.  Marland Kitchen lidocaine (LIDODERM) 5 % Place 1 patch onto the skin daily. Remove & Discard patch within 12 hours or as directed by MD  . losartan (COZAAR) 100 MG tablet Take 1 tablet (100 mg total) by mouth daily. Take 1 tablet by mouth daily.  . Multiple Vitamins-Minerals (MULTIVITAMIN PO) Take 1 tablet by mouth daily.  . pantoprazole (PROTONIX) 40 MG tablet TAKE 1 TABLET BY MOUTH  DAILY  . tamsulosin (FLOMAX) 0.4 MG CAPS capsule TAKE 1 CAPSULE BY MOUTH  DAILY  . traMADol (ULTRAM) 50 MG tablet Take 1 tablet (50 mg total) by mouth every 6 (six) hours as needed.  . triamcinolone (NASACORT) 55 MCG/ACT AERO nasal inhaler Place 2 sprays into the nose daily.   No current facility-administered medications on file prior to visit.      Allergies  Allergen Reactions  . Amlodipine Swelling  . Cymbalta  [Duloxetine Hcl]   . Oxycodone      Objective:  Vascular Examination: Capillary refill time immediate x 10 digits.  Dorsalis pedis present b/l.  Posterior tibial pulses present b/l.  Digital hair absent b/l.  No pedal edema b/l.   Skin temperature gradient WNL b/l.   Dermatological Examination: Skin with normal turgor, texture and tone b/l.  Hyperkeratotic lesions submetatarsal head 1 b/l, medial aspect left 2nd digit and lateral aspect left hallux. No erythema, no edema, no drainage, no flocculence noted.  Toenails 1-5 b/l discolored, thick, dystrophic with subungual debris and pain with palpation to nailbeds due to thickness of nails.  Musculoskeletal: Muscle strength 5/5 b/l to all LE muscle groups.  Bunion deformity b/l.  Hammertoes lesser digits b/l.  No pain, crepitus or joint limitation noted with ROM.   Neurological Examination: Subjective symptoms of neuropathy.   Protective sensation intact 5/5 with 10 gram monofilament bilaterally.  Assessment: Painful onychomycosis toenails 1-5 b/l  Calluses submet head 1 b/l Interdigital corns left hallux, left 2nd digit NIDDM  Plan: 1. Toenails 1-5 b/l were debrided in length and girth without iatrogenic bleeding.  2. Hyperkeratotic lesions pared submet head 1 b/l, left hallux and left 2nd digit utilizing sterile scalpel blade without incident. 3. Patient to continue soft, supportive shoe gear daily. 4. Patient to report any pedal injuries to medical professional immediately. 5. Follow up 3 months.  6. Patient/POA to call should  there be a concern in the interim.

## 2019-01-30 ENCOUNTER — Telehealth: Payer: Self-pay | Admitting: Internal Medicine

## 2019-01-30 NOTE — Telephone Encounter (Signed)
Pt stated his Blood Sugar was 150 this morning.  He wants to know if the provider would like for him to come in for an appt.

## 2019-01-31 NOTE — Telephone Encounter (Signed)
Pt has been informed and expressed understanding.  

## 2019-01-31 NOTE — Telephone Encounter (Signed)
Lab Results  Component Value Date   HGBA1C 6.8 (H) 10/04/2018   Recent a1c was oK  Please check cbgs at least twice per day and call Mon next wk with results

## 2019-02-06 ENCOUNTER — Telehealth: Payer: Self-pay | Admitting: Internal Medicine

## 2019-02-06 MED ORDER — GLIMEPIRIDE 2 MG PO TABS: ORAL_TABLET | ORAL | 3 refills | Status: DC

## 2019-02-06 NOTE — Telephone Encounter (Signed)
I agree, it does seem like the PM sugars are too high.  This means a bit more medication in the AM should be good  Ok to increase the glimeparide 2 mg pill to 1 AND 1/2 pills in the AM  I will send a new rx  OK to continue to monitor sugars and let us know again in 1 wk how it worked out

## 2019-02-06 NOTE — Telephone Encounter (Signed)
Pt was to be keeping Blood sugars as follows  Starting 24th am 133 117 126 154 147 132 Today 130 before eating on all  Evening around 10:00 after eating at 6:30 24th pm 282               284               136               194               146     29th   203   FU at [phone]

## 2019-02-06 NOTE — Addendum Note (Signed)
Addended by: [name] on: 02/06/2019 01:09 PM   Modules accepted: Orders

## 2019-02-08 NOTE — Telephone Encounter (Signed)
Pt has been informed and will call back in 1 week to give new sugar readings.

## 2019-02-15 MED ORDER — GLIMEPIRIDE 2 MG PO TABS: ORAL_TABLET | ORAL | 3 refills | Status: DC

## 2019-02-15 NOTE — Telephone Encounter (Signed)
PM sugars still high  Ok to increase the AM glimeparide to 4 mg per day (2 of the 2 mg pills in the am)  I [facility]

## 2019-02-15 NOTE — Telephone Encounter (Signed)
Pt has been informed and expressed understanding.  

## 2019-02-15 NOTE — Telephone Encounter (Signed)
Patient called in with blood sugars readings. Pt reported he takes AM around 7:30 and PM around 10:00. Please advise.   12/2: AM-121, PM-268 12/3: AM-117, PM-244 12/4: AM- 134, PM-184 12/5: AM- 131, PM-1775 12/6: AM-120, PM-158 12/7: AM 130, PM-149 12/8: AM 122, PM-211 12/9: AM 137.

## 2019-02-15 NOTE — Addendum Note (Signed)
Addended by: [name] on: 02/15/2019 12:21 PM   Modules accepted: Orders

## 2019-03-20 ENCOUNTER — Ambulatory Visit: Payer: Medicare Other | Attending: Internal Medicine

## 2019-03-20 DIAGNOSIS — Z20822 Contact with and (suspected) exposure to covid-19: Secondary | ICD-10-CM

## 2019-03-21 LAB — NOVEL CORONAVIRUS, NAA: SARS-CoV-2, NAA: NOT DETECTED

## 2019-04-06 ENCOUNTER — Encounter: Payer: Self-pay | Admitting: Internal Medicine

## 2019-04-06 ENCOUNTER — Ambulatory Visit (INDEPENDENT_AMBULATORY_CARE_PROVIDER_SITE_OTHER): Payer: Medicare Other | Admitting: Internal Medicine

## 2019-04-06 ENCOUNTER — Other Ambulatory Visit: Payer: Self-pay

## 2019-04-06 VITALS — BP 150/82 | HR 71 | Temp 98.3°F | Ht 72.0 in | Wt 183.4 lb

## 2019-04-06 DIAGNOSIS — I10 Essential (primary) hypertension: Secondary | ICD-10-CM

## 2019-04-06 DIAGNOSIS — D509 Iron deficiency anemia, unspecified: Secondary | ICD-10-CM | POA: Diagnosis not present

## 2019-04-06 DIAGNOSIS — E559 Vitamin D deficiency, unspecified: Secondary | ICD-10-CM

## 2019-04-06 DIAGNOSIS — E538 Deficiency of other specified B group vitamins: Secondary | ICD-10-CM

## 2019-04-06 DIAGNOSIS — E1140 Type 2 diabetes mellitus with diabetic neuropathy, unspecified: Secondary | ICD-10-CM | POA: Diagnosis not present

## 2019-04-06 LAB — URINALYSIS, ROUTINE W REFLEX MICROSCOPIC
Bilirubin Urine: NEGATIVE
Hgb urine dipstick: NEGATIVE
Ketones, ur: NEGATIVE
Leukocytes,Ua: NEGATIVE
Nitrite: NEGATIVE
RBC / HPF: NONE SEEN (ref 0–?)
Specific Gravity, Urine: 1.015 (ref 1.000–1.030)
Total Protein, Urine: NEGATIVE
Urine Glucose: NEGATIVE
Urobilinogen, UA: 0.2 (ref 0.0–1.0)
WBC, UA: NONE SEEN (ref 0–?)
pH: 5.5 (ref 5.0–8.0)

## 2019-04-06 LAB — CBC WITH DIFFERENTIAL/PLATELET
Basophils Absolute: 0 10*3/uL (ref 0.0–0.1)
Basophils Relative: 1 % (ref 0.0–3.0)
Eosinophils Absolute: 0.2 10*3/uL (ref 0.0–0.7)
Eosinophils Relative: 4.8 % (ref 0.0–5.0)
HCT: 42.3 % (ref 39.0–52.0)
Hemoglobin: 13.9 g/dL (ref 13.0–17.0)
Lymphocytes Relative: 35.2 % (ref 12.0–46.0)
Lymphs Abs: 1.3 10*3/uL (ref 0.7–4.0)
MCHC: 32.9 g/dL (ref 30.0–36.0)
MCV: 89.9 fl (ref 78.0–100.0)
Monocytes Absolute: 0.4 10*3/uL (ref 0.1–1.0)
Monocytes Relative: 10.6 % (ref 3.0–12.0)
Neutro Abs: 1.7 10*3/uL (ref 1.4–7.7)
Neutrophils Relative %: 48.4 % (ref 43.0–77.0)
Platelets: 164 10*3/uL (ref 150.0–400.0)
RBC: 4.7 Mil/uL (ref 4.22–5.81)
RDW: 14.5 % (ref 11.5–15.5)
WBC: 3.6 10*3/uL — ABNORMAL LOW (ref 4.0–10.5)

## 2019-04-06 LAB — BASIC METABOLIC PANEL
BUN: 27 mg/dL — ABNORMAL HIGH (ref 6–23)
CO2: 28 mEq/L (ref 19–32)
Calcium: 9.9 mg/dL (ref 8.4–10.5)
Chloride: 106 mEq/L (ref 96–112)
Creatinine, Ser: 1.65 mg/dL — ABNORMAL HIGH (ref 0.40–1.50)
GFR: 48.48 mL/min — ABNORMAL LOW (ref >60.00)
Glucose, Bld: 186 mg/dL — ABNORMAL HIGH (ref 70–99)
Potassium: 4.3 mEq/L (ref 3.5–5.1)
Sodium: 141 mEq/L (ref 135–145)

## 2019-04-06 LAB — MICROALBUMIN / CREATININE URINE RATIO
Creatinine,U: 91.6 mg/dL
Microalb Creat Ratio: 0.9 mg/g (ref 0.0–30.0)
Microalb, Ur: 0.8 mg/dL (ref 0.0–1.9)

## 2019-04-06 LAB — HEPATIC FUNCTION PANEL
ALT: 21 U/L (ref 0–53)
AST: 22 U/L (ref 0–37)
Albumin: 4.3 g/dL (ref 3.5–5.2)
Alkaline Phosphatase: 113 U/L (ref 39–117)
Bilirubin, Direct: 0.2 mg/dL (ref 0.0–0.3)
Total Bilirubin: 0.5 mg/dL (ref 0.2–1.2)
Total Protein: 6.8 g/dL (ref 6.0–8.3)

## 2019-04-06 LAB — TSH: TSH: 4.48 u[IU]/mL (ref 0.35–4.50)

## 2019-04-06 LAB — HEMOGLOBIN A1C: Hgb A1c MFr Bld: 7 % — ABNORMAL HIGH (ref 4.6–6.5)

## 2019-04-06 LAB — LIPID PANEL
Cholesterol: 118 mg/dL (ref 0–200)
HDL: 53 mg/dL (ref >39.00)
LDL Cholesterol: 54 mg/dL (ref 0–99)
NonHDL: 65.29
Total CHOL/HDL Ratio: 2
Triglycerides: 55 mg/dL (ref 0.0–149.0)
VLDL: 11 mg/dL (ref 0.0–40.0)

## 2019-04-06 LAB — IBC PANEL
Iron: 73 ug/dL (ref 42–165)
Saturation Ratios: 26.2 % (ref 20.0–50.0)
Transferrin: 199 mg/dL — ABNORMAL LOW (ref 212.0–360.0)

## 2019-04-06 LAB — VITAMIN D 25 HYDROXY (VIT D DEFICIENCY, FRACTURES): VITD: 45.39 ng/mL (ref 30.00–100.00)

## 2019-04-06 LAB — VITAMIN B12: Vitamin B-12: 320 pg/mL (ref 211–911)

## 2019-04-06 NOTE — Assessment & Plan Note (Signed)
Cont replacement

## 2019-04-06 NOTE — Assessment & Plan Note (Addendum)
stable overall by history and exam, recent data reviewed with pt, and pt to continue medical treatment as before,  to f/u any worsening symptoms or concerns  I spent 32 minutes preparing to see the patient by review of recent labs, imaging and procedures, obtaining and reviewing separately obtained history, communicating with the patient and family or caregiver, ordering medications, tests or procedures, and documenting clinical information in the EHR including the differential Dx, treatment, and any further evaluation and other management of DM, Vit d deficiency, HTN, b12 deficiency, anemia - iron defieciency

## 2019-04-06 NOTE — Patient Instructions (Signed)
.  Please continue all other medications as before, and refills have been done if requested.  Please have the pharmacy call with any other refills you may need.  Please continue your efforts at being more active, low cholesterol diet, and weight control.  You are otherwise up to date with prevention measures today.  Please keep your appointments with your specialists as you may have planned  Please go to the LAB at the blood drawing area for the tests to be done  You will be contacted by phone if any changes need to be made immediately.  Otherwise, you will receive a letter about your results with an explanation, but please check with MyChart first.  Please remember to sign up for MyChart if you have not done so, as this will be important to you in the future with finding out test results, communicating by private email, and scheduling acute appointments online when needed.  Please make an Appointment to return in 6 months, or sooner if needed 

## 2019-04-06 NOTE — Assessment & Plan Note (Signed)
stable overall by history and exam, recent data reviewed with pt, and pt to continue medical treatment as before,  to f/u any worsening symptoms or concerns  

## 2019-04-06 NOTE — Assessment & Plan Note (Signed)
Continue replacement

## 2019-04-06 NOTE — Progress Notes (Signed)
Subjective:    Patient ID: Nicholas Patton, male    DOB: 1934/07/06, 85 y.o.   MRN: [id number]  HPI  Here to f/u; overall doing ok,  Pt denies chest pain, increasing sob or doe, wheezing, orthopnea, PND, increased LE swelling, palpitations, dizziness or syncope.  Pt denies new neurological symptoms such as new headache, or facial or extremity weakness or numbness.  Pt denies polydipsia, polyuria, or low sugar episode.  Pt states overall good compliance with meds, mostly trying to follow appropriate diet, with wt overall stable,  but little exercise however.  BP at home < 130/90, HR often in the 50's. Toleraing b12. No overt bleeding Past Medical History:  Diagnosis Date  . Anemia   . Arthritis   . Cataract    REMOVED BILATERAL  . Colon polyps   . DIABETES MELLITUS, TYPE II    takes Metformin and Glimepiride daily  . Diverticulosis   . GERD (gastroesophageal reflux disease)    takes Protonix daily  . History of colon polyps   . HYPERLIPIDEMIA    takes Atorvastatin daily  . HYPERTENSION    takes Amlodipine and Hyzaar daily  . Ileus following gastrointestinal surgery ([place]) 07/22/2012  . Joint pain   . Peripheral neuropathy   . Peripheral neuropathy   . PERIPHERAL NEUROPATHY, FEET 10/06/2007  . Renal disorder   . VITAMIN B12 DEFICIENCY 09/06/2008  . VITAMIN D DEFICIENCY 01/16/2010   takes Vitamin D daily   Past Surgical History:  Procedure Laterality Date  . CATARACT EXTRACTION     both eyes  . CHOLECYSTECTOMY N/A 07/18/2012   Procedure: LAPAROSCOPIC CHOLECYSTECTOMY WITH INTRAOPERATIVE CHOLANGIOGRAM;  Surgeon: Zenovia Jarred, MD;  Location: [facility];  Service: General;  Laterality: N/A;  . COLONOSCOPY    . POLYPECTOMY    . POSTERIOR CERVICAL FUSION/FORAMINOTOMY N/A 06/27/2015   Procedure: Posterior Cervical Fusion with lateral mass fixation Cervical four to cervical seven;  Surgeon: Eustace Moore, MD;  Location: [facility];  Service: Neurosurgery;  Laterality: N/A;  . TOTAL KNEE  ARTHROPLASTY  2001   Left  . TURP VAPORIZATION      reports that he has quit smoking. His smoking use included cigarettes. He smoked 0.00 packs per day. He has never used smokeless tobacco. He reports that he does not drink alcohol or use drugs. family history includes Diabetes Mellitus II in his mother; Lung cancer in his brother. Allergies  Allergen Reactions  . Amlodipine Swelling  . Cymbalta [Duloxetine Hcl]   . Oxycodone    Current Outpatient Medications on File Prior to Visit  Medication Sig Dispense Refill  . aspirin 81 MG EC tablet Take 81 mg by mouth daily.      Marland Kitchen atorvastatin (LIPITOR) 40 MG tablet TAKE 1 TABLET BY MOUTH  DAILY 90 tablet 3  . carvedilol (COREG) 6.25 MG tablet Take 1 tablet (6.25 mg total) by mouth 2 (two) times daily with a meal. 60 tablet 2  . Cholecalciferol (VITAMIN D3) 1000 UNITS CAPS Take 1 capsule (1,000 Units total) by mouth daily. 90 capsule 3  . glimepiride (AMARYL) 2 MG tablet TAKE 2 TABLET BY MOUTH  DAILY BEFORE BREAKFAST 180 tablet 3  . glucose blood (ONE TOUCH ULTRA TEST) test strip Use as instructed E11.9 200 each 12  . losartan (COZAAR) 100 MG tablet Take 1 tablet (100 mg total) by mouth daily. Take 1 tablet by mouth daily. 90 tablet 1  . Multiple Vitamins-Minerals (MULTIVITAMIN PO) Take 1 tablet by mouth  daily.    . pantoprazole (PROTONIX) 40 MG tablet TAKE 1 TABLET BY MOUTH  DAILY 90 tablet 3  . tamsulosin (FLOMAX) 0.4 MG CAPS capsule TAKE 1 CAPSULE BY MOUTH  DAILY 90 capsule 3  . cetirizine (ZYRTEC) 10 MG tablet Take 1 tablet (10 mg total) by mouth daily. (Patient not taking: Reported on 04/06/2019) 30 tablet 11  . HYDROcodone-acetaminophen (NORCO/VICODIN) 5-325 MG tablet Take 1 tablet by mouth every 6 (six) hours as needed for moderate pain. (Patient not taking: Reported on 04/06/2019) 30 tablet 0  . lidocaine (LIDODERM) 5 % Place 1 patch onto the skin daily. Remove & Discard patch within 12 hours or as directed by MD (Patient not taking: Reported  on 04/06/2019) 30 patch 1  . traMADol (ULTRAM) 50 MG tablet Take 1 tablet (50 mg total) by mouth every 6 (six) hours as needed. (Patient not taking: Reported on 04/06/2019) 30 tablet 1  . triamcinolone (NASACORT) 55 MCG/ACT AERO nasal inhaler Place 2 sprays into the nose daily. (Patient not taking: Reported on 04/06/2019) 1 Inhaler 12   No current facility-administered medications on file prior to visit.   Review of Systems All otherwise neg per pt     Objective:   Physical Exam BP (!) 150/82 (BP Location: Left Arm, Patient Position: Sitting, Cuff Size: Normal)   Pulse 71   Temp 98.3 F (36.8 C) (Oral)   Ht 6' (1.829 m)   Wt 183 lb 6.4 oz (83.2 kg)   SpO2 98%   BMI 24.87 kg/m  VS noted,  Constitutional: Pt appears in NAD HENT: Head: NCAT.  Right Ear: External ear normal.  Left Ear: External ear normal.  Eyes: . Pupils are equal, round, and reactive to light. Conjunctivae and EOM are normal Nose: without d/c or deformity Neck: Neck supple. Gross normal ROM Cardiovascular: Normal rate and regular rhythm.   Pulmonary/Chest: Effort normal and breath sounds without rales or wheezing.  Abd:  Soft, NT, ND, + BS, no organomegaly Neurological: Pt is alert. At baseline orientation, motor grossly intact Skin: Skin is warm. No rashes, other new lesions, no LE edema Psychiatric: Pt behavior is normal without agitation  All otherwise neg per pt Lab Results  Component Value Date   WBC 3.6 (L) 04/06/2019   HGB 13.9 04/06/2019   HCT 42.3 04/06/2019   PLT 164.0 04/06/2019   GLUCOSE 186 (H) 04/06/2019   CHOL 118 04/06/2019   TRIG 55.0 04/06/2019   HDL 53.00 04/06/2019   LDLCALC 54 04/06/2019   ALT 21 04/06/2019   AST 22 04/06/2019   NA 141 04/06/2019   K 4.3 04/06/2019   CL 106 04/06/2019   CREATININE 1.65 (H) 04/06/2019   BUN 27 (H) 04/06/2019   CO2 28 04/06/2019   TSH 4.48 04/06/2019   PSA 4.61 (H) 04/09/2017   INR 1.12 06/19/2015   HGBA1C 7.0 (H) 04/06/2019   MICROALBUR 0.8  04/06/2019      Assessment & Plan:

## 2019-04-06 NOTE — Assessment & Plan Note (Signed)
stable overall by history and exam, recent data reviewed with pt, and pt to continue medical treatment as before,  to f/u any worsening symptoms or concerns, for lab f/u

## 2019-04-21 ENCOUNTER — Other Ambulatory Visit: Payer: Self-pay

## 2019-04-21 ENCOUNTER — Encounter: Payer: Self-pay | Admitting: Podiatry

## 2019-04-21 ENCOUNTER — Ambulatory Visit (INDEPENDENT_AMBULATORY_CARE_PROVIDER_SITE_OTHER): Payer: Medicare Other | Admitting: Podiatry

## 2019-04-21 DIAGNOSIS — L84 Corns and callosities: Secondary | ICD-10-CM | POA: Diagnosis not present

## 2019-04-21 DIAGNOSIS — M79674 Pain in right toe(s): Secondary | ICD-10-CM | POA: Diagnosis not present

## 2019-04-21 DIAGNOSIS — B351 Tinea unguium: Secondary | ICD-10-CM

## 2019-04-21 DIAGNOSIS — E1142 Type 2 diabetes mellitus with diabetic polyneuropathy: Secondary | ICD-10-CM

## 2019-04-21 DIAGNOSIS — M79675 Pain in left toe(s): Secondary | ICD-10-CM

## 2019-04-21 MED ORDER — NONFORMULARY OR COMPOUNDED ITEM: 3 refills | Status: DC

## 2019-04-21 NOTE — Patient Instructions (Addendum)
[place] [phone] Neuropathy Cream    Diabetes Mellitus and Foot Care Foot care is an important part of your health, especially when you have diabetes. Diabetes may cause you to have problems because of poor blood flow (circulation) to your feet and legs, which can cause your skin to:  Become thinner and drier.  Break more easily.  Heal more slowly.  Peel and crack. You may also have nerve damage (neuropathy) in your legs and feet, causing decreased feeling in them. This means that you may not notice minor injuries to your feet that could lead to more serious problems. Noticing and addressing any potential problems early is the best way to prevent future foot problems. How to care for your feet Foot hygiene  Wash your feet daily with warm water and mild soap. Do not use hot water. Then, pat your feet and the areas between your toes until they are completely dry. Do not soak your feet as this can dry your skin.  Trim your toenails straight across. Do not dig under them or around the cuticle. File the edges of your nails with an emery board or nail file.  Apply a moisturizing lotion or petroleum jelly to the skin on your feet and to dry, brittle toenails. Use lotion that does not contain alcohol and is unscented. Do not apply lotion between your toes. Shoes and socks  Wear clean socks or stockings every day. Make sure they are not too tight. Do not wear knee-high stockings since they may decrease blood flow to your legs.  Wear shoes that fit properly and have enough cushioning. Always look in your shoes before you put them on to be sure there are no objects inside.  To break in new shoes, wear them for just a few hours a day. This prevents injuries on your feet. Wounds, scrapes, corns, and calluses  Check your feet daily for blisters, cuts, bruises, sores, and redness. If you cannot see the bottom of your feet, use a mirror or ask someone for help.  Do not cut corns  or calluses or try to remove them with medicine.  If you find a minor scrape, cut, or break in the skin on your feet, keep it and the skin around it clean and dry. You may clean these areas with mild soap and water. Do not clean the area with peroxide, alcohol, or iodine.  If you have a wound, scrape, corn, or callus on your foot, look at it several times a day to make sure it is healing and not infected. Check for: ? Redness, swelling, or pain. ? Fluid or blood. ? Warmth. ? Pus or a bad smell. General instructions  Do not cross your legs. This may decrease blood flow to your feet.  Do not use heating pads or hot water bottles on your feet. They may burn your skin. If you have lost feeling in your feet or legs, you may not know this is happening until it is too late.  Protect your feet from hot and cold by wearing shoes, such as at the beach or on hot pavement.  Schedule a complete foot exam at least once a year (annually) or more often if you have foot problems. If you have foot problems, report any cuts, sores, or bruises to your health care provider immediately. Contact a health care provider if:  You have a medical condition that increases your risk of infection and you have any cuts, sores, or bruises on your feet.  You have an injury that is not healing.  You have redness on your legs or feet.  You feel burning or tingling in your legs or feet.  You have pain or cramps in your legs and feet.  Your legs or feet are numb.  Your feet always feel cold.  You have pain around a toenail. Get help right away if:  You have a wound, scrape, corn, or callus on your foot and: ? You have pain, swelling, or redness that gets worse. ? You have fluid or blood coming from the wound, scrape, corn, or callus. ? Your wound, scrape, corn, or callus feels warm to the touch. ? You have pus or a bad smell coming from the wound, scrape, corn, or callus. ? You have a fever. ? You have a red  line going up your leg. Summary  Check your feet every day for cuts, sores, red spots, swelling, and blisters.  Moisturize feet and legs daily.  Wear shoes that fit properly and have enough cushioning.  If you have foot problems, report any cuts, sores, or bruises to your health care provider immediately.  Schedule a complete foot exam at least once a year (annually) or more often if you have foot problems. This information is not intended to replace advice given to you by your health care provider. Make sure you discuss any questions you have with your health care provider. Document Revised: 11/16/2018 Document Reviewed: 03/27/2016 Elsevier Patient Education  [place].

## 2019-04-21 NOTE — Progress Notes (Signed)
Subjective: Nicholas Patton presents today for follow up of preventative diabetic foot care and corn(s) interdigitally left foot and painful mycotic toenails b/l that are difficult to trim. Pain interferes with ambulation. Aggravating factors include wearing enclosed shoe gear. Pain is relieved with periodic professional debridement.   He is complaining of increasingly painful feet, tingling to toes and numbness on ball of both feet. He has been applying peroxide.  Allergies  Allergen Reactions  . Amlodipine Swelling  . Cymbalta [Duloxetine Hcl]   . Oxycodone     Objective: There were no vitals filed for this visit.  Vascular Examination:  Capillary refill time to digits immediate b/l, palpable DP pulses b/l, palpable PT pulses b/l, pedal hair absent b/l and skin temperature gradient within normal limits b/l  Dermatological Examination: Pedal skin with normal turgor, texture and tone bilaterally, no open wounds bilaterally, no interdigital macerations bilaterally, toenails 1-5 b/l elongated, dystrophic, thickened, crumbly with subungual debris and hyperkeratotic lesion(s) submet head 1 b/l, medial left 2nd digit and lateral aspect left hallux IPJ.  No erythema, no edema, no drainage, no flocculence  Musculoskeletal: Normal muscle strength 5/5 to all lower extremity muscle groups bilaterally, no pain crepitus or joint limitation noted with ROM b/l, bunion deformity noted b/l and hammertoes noted to the  2-5 bilaterally  Neurological: Protective sensation intact 5/5 intact bilaterally with 10g monofilament b/l and subjective symptoms of neuropathy ball of feet and tips of toes  Assessment: 1. Pain due to onychomycosis of toenails of both feet   2. Corns and callosities   3. Diabetic peripheral neuropathy associated with type 2 diabetes mellitus ([place])    Plan: -Continue diabetic foot care principles. Literature dispensed on today.  -Toenails 1-5 b/l were debrided in length and girth  without iatrogenic bleeding. -corns and calluses were debrided without complication or incident. Total number debrided =4, submet head 1 b/l, left hallux, left 2nd digit -Patient to continue soft, supportive shoe gear daily. -Patient to report any pedal injuries to medical professional immediately. -A prescription was sent to [facility] for peripheral neuropathy cream which consists of: Bupivacaine 1%, doxepin 3%, gabapentin 6%, pentoxifylline 3%, and Topimarate 1%. Apply 1-2 grams to feet 3-4 times daily as needed for foot pain. -Patient/POA to call should there be question/concern in the interim.  Return in about 3 months (around 07/19/2019) for diabetic nail and callus trim.

## 2019-05-09 DIAGNOSIS — N183 Chronic kidney disease, stage 3 unspecified: Secondary | ICD-10-CM | POA: Diagnosis not present

## 2019-05-09 DIAGNOSIS — N189 Chronic kidney disease, unspecified: Secondary | ICD-10-CM | POA: Diagnosis not present

## 2019-05-15 DIAGNOSIS — E559 Vitamin D deficiency, unspecified: Secondary | ICD-10-CM | POA: Diagnosis not present

## 2019-05-15 DIAGNOSIS — N183 Chronic kidney disease, stage 3 unspecified: Secondary | ICD-10-CM | POA: Diagnosis not present

## 2019-05-15 DIAGNOSIS — D631 Anemia in chronic kidney disease: Secondary | ICD-10-CM | POA: Diagnosis not present

## 2019-05-15 DIAGNOSIS — R600 Localized edema: Secondary | ICD-10-CM | POA: Diagnosis not present

## 2019-05-15 DIAGNOSIS — D472 Monoclonal gammopathy: Secondary | ICD-10-CM | POA: Diagnosis not present

## 2019-05-15 DIAGNOSIS — I129 Hypertensive chronic kidney disease with stage 1 through stage 4 chronic kidney disease, or unspecified chronic kidney disease: Secondary | ICD-10-CM | POA: Diagnosis not present

## 2019-05-18 ENCOUNTER — Emergency Department (HOSPITAL_COMMUNITY): Payer: Medicare Other

## 2019-05-18 ENCOUNTER — Emergency Department (HOSPITAL_COMMUNITY)
Admission: EM | Admit: 2019-05-18 | Discharge: 2019-05-18 | Disposition: A | Discharge status: Home / Self Care | Payer: Medicare Other | Attending: Emergency Medicine | Admitting: Emergency Medicine

## 2019-05-18 ENCOUNTER — Encounter (HOSPITAL_COMMUNITY): Payer: Self-pay

## 2019-05-18 ENCOUNTER — Other Ambulatory Visit: Payer: Self-pay

## 2019-05-18 DIAGNOSIS — Z87891 Personal history of nicotine dependence: Secondary | ICD-10-CM | POA: Diagnosis not present

## 2019-05-18 DIAGNOSIS — Z7984 Long term (current) use of oral hypoglycemic drugs: Secondary | ICD-10-CM | POA: Diagnosis not present

## 2019-05-18 DIAGNOSIS — N183 Chronic kidney disease, stage 3 unspecified: Secondary | ICD-10-CM | POA: Diagnosis not present

## 2019-05-18 DIAGNOSIS — E1122 Type 2 diabetes mellitus with diabetic chronic kidney disease: Secondary | ICD-10-CM | POA: Insufficient documentation

## 2019-05-18 DIAGNOSIS — Z96652 Presence of left artificial knee joint: Secondary | ICD-10-CM | POA: Insufficient documentation

## 2019-05-18 DIAGNOSIS — R5383 Other fatigue: Secondary | ICD-10-CM | POA: Diagnosis not present

## 2019-05-18 DIAGNOSIS — I10 Essential (primary) hypertension: Secondary | ICD-10-CM

## 2019-05-18 DIAGNOSIS — R0602 Shortness of breath: Secondary | ICD-10-CM

## 2019-05-18 DIAGNOSIS — Z7982 Long term (current) use of aspirin: Secondary | ICD-10-CM | POA: Diagnosis not present

## 2019-05-18 DIAGNOSIS — Z79899 Other long term (current) drug therapy: Secondary | ICD-10-CM | POA: Insufficient documentation

## 2019-05-18 DIAGNOSIS — I129 Hypertensive chronic kidney disease with stage 1 through stage 4 chronic kidney disease, or unspecified chronic kidney disease: Secondary | ICD-10-CM | POA: Diagnosis not present

## 2019-05-18 LAB — COMPREHENSIVE METABOLIC PANEL
ALT: 24 U/L (ref 0–44)
AST: 26 U/L (ref 15–41)
Albumin: 4.3 g/dL (ref 3.5–5.0)
Alkaline Phosphatase: 95 U/L (ref 38–126)
Anion gap: 9 (ref 5–15)
BUN: 29 mg/dL — ABNORMAL HIGH (ref 8–23)
CO2: 26 mmol/L (ref 22–32)
Calcium: 9.7 mg/dL (ref 8.9–10.3)
Chloride: 109 mmol/L (ref 98–111)
Creatinine, Ser: 1.61 mg/dL — ABNORMAL HIGH (ref 0.61–1.24)
GFR calc Af Amer: 45 mL/min — ABNORMAL LOW (ref >60)
GFR calc non Af Amer: 39 mL/min — ABNORMAL LOW (ref >60)
Glucose, Bld: 83 mg/dL (ref 70–99)
Potassium: 4.6 mmol/L (ref 3.5–5.1)
Sodium: 144 mmol/L (ref 135–145)
Total Bilirubin: 0.9 mg/dL (ref 0.3–1.2)
Total Protein: 7.2 g/dL (ref 6.5–8.1)

## 2019-05-18 LAB — CBC WITH DIFFERENTIAL/PLATELET
Abs Immature Granulocytes: 0 10*3/uL (ref 0.00–0.07)
Basophils Absolute: 0 10*3/uL (ref 0.0–0.1)
Basophils Relative: 1 %
Eosinophils Absolute: 0.2 10*3/uL (ref 0.0–0.5)
Eosinophils Relative: 5 %
HCT: 44.4 % (ref 39.0–52.0)
Hemoglobin: 14.1 g/dL (ref 13.0–17.0)
Immature Granulocytes: 0 %
Lymphocytes Relative: 37 %
Lymphs Abs: 1.6 10*3/uL (ref 0.7–4.0)
MCH: 29.3 pg (ref 26.0–34.0)
MCHC: 31.8 g/dL (ref 30.0–36.0)
MCV: 92.3 fL (ref 80.0–100.0)
Monocytes Absolute: 0.4 10*3/uL (ref 0.1–1.0)
Monocytes Relative: 10 %
Neutro Abs: 2.1 10*3/uL (ref 1.7–7.7)
Neutrophils Relative %: 47 %
Platelets: 166 10*3/uL (ref 150–400)
RBC: 4.81 MIL/uL (ref 4.22–5.81)
RDW: 14.1 % (ref 11.5–15.5)
WBC: 4.3 10*3/uL (ref 4.0–10.5)
nRBC: 0 % (ref 0.0–0.2)

## 2019-05-18 LAB — BRAIN NATRIURETIC PEPTIDE: B Natriuretic Peptide: 48.5 pg/mL (ref 0.0–100.0)

## 2019-05-18 MED ORDER — SODIUM CHLORIDE (PF) 0.9 % IJ SOLN
INTRAMUSCULAR | Status: AC
  Filled 2019-05-18: qty 50

## 2019-05-18 MED ORDER — IOHEXOL 350 MG/ML SOLN
100.0000 mL | Freq: Once | INTRAVENOUS | Status: AC | PRN
  Administered 2019-05-18: 100 mL via INTRAVENOUS

## 2019-05-18 NOTE — ED Triage Notes (Signed)
Patient reports that he has had SOB and fatigue x 2-3 weeks and worse today.

## 2019-05-18 NOTE — Discharge Instructions (Addendum)
Work-up for the shortness of breath without any acute findings.  Including CT scan of the chest without evidence of any blood clots or fluid or evidence of pneumonia.  Labs without any significant abnormalities.  Would recommend follow-up with your primary care doctor for further evaluation of the shortness of breath.  They could consider doing pulmonary function test.  In addition your blood pressures been elevated here.  Take your blood pressure medicine as directed.  And have your primary care doctor recheck this.

## 2019-05-18 NOTE — ED Notes (Signed)
Pt transported to CT

## 2019-05-18 NOTE — ED Notes (Signed)
Patient ambulated to restroom without assistance. Gait steady. Will continue to monitor. 

## 2019-05-18 NOTE — ED Provider Notes (Signed)
[facility] DEPT Provider Note   CSN: [id number] Arrival date & time: 05/18/19  1533     History Chief Complaint  Patient presents with  . Shortness of Breath  . Fatigue    Nicholas Patton is a 85 y.o. male.  Patient reports shortness of breath some fatigue for about 2 to 3 weeks.  Felt it was worse today.  Patient with some exertional shortness of breath.  Room air sats 97%.  Afebrile blood pressure elevated at 171/65.  Patient states that he thinks he has bronchitis.  We have not noticed any coughing.  Patient has a history of hypertension.  Patient denies any chest pain.        Past Medical History:  Diagnosis Date  . Anemia   . Arthritis   . Cataract    REMOVED BILATERAL  . Colon polyps   . DIABETES MELLITUS, TYPE II    takes Metformin and Glimepiride daily  . Diverticulosis   . GERD (gastroesophageal reflux disease)    takes Protonix daily  . History of colon polyps   . HYPERLIPIDEMIA    takes Atorvastatin daily  . HYPERTENSION    takes Amlodipine and Hyzaar daily  . Ileus following gastrointestinal surgery ([place]) 07/22/2012  . Joint pain   . Peripheral neuropathy   . Peripheral neuropathy   . PERIPHERAL NEUROPATHY, FEET 10/06/2007  . Renal disorder   . VITAMIN B12 DEFICIENCY 09/06/2008  . VITAMIN D DEFICIENCY 01/16/2010   takes Vitamin D daily    Patient Active Problem List   Diagnosis Date Noted  . Abnormal TSH 10/04/2018  . Headache 10/04/2018  . PHN (postherpetic neuralgia) 04/04/2018  . Shingles 03/11/2018  . Acute gout due to renal impairment involving toe of left foot 02/02/2018  . Eustachian tube dysfunction, left 12/06/2017  . MGUS (monoclonal gammopathy of unknown significance) 10/28/2017  . Elevated blood protein 10/08/2017  . Peripheral edema 09/17/2017  . Anemia, iron deficiency 04/10/2016  . Left-sided nosebleed 01/13/2016  . Hematochezia 11/21/2015  . Peripheral neuropathic pain 10/25/2015  . Low back pain  10/08/2015  . CKD (chronic kidney disease) stage 3, GFR 30-59 ml/min 10/08/2015  . S/P cervical spinal fusion 06/27/2015  . Increased prostate specific antigen (PSA) velocity 04/09/2015  . Chest pain 08/14/2014  . Dyspnea 08/14/2014  . Urinary frequency 06/19/2014  . Overactive bladder 09/27/2013  . Onychomycosis 08/08/2013  . Pain in lower limb 05/15/2013  . Diabetic peripheral neuropathy ([place]) 05/15/2013  . Other hammer toe (acquired) 05/15/2013  . HAV (hallux abducto valgus) 05/15/2013  . Blood in mouth of unknown source 02/09/2013  . Vertigo 12/29/2012  . Acute upper respiratory infection 12/29/2012  . Erectile dysfunction 09/22/2012  . Left ear hearing loss 09/13/2012  . S/P laparoscopic cholecystectomy 08/03/2012  . Neck pain on left side 04/04/2012  . Trapezoid ligament sprain 04/04/2012  . Bladder neck obstruction 10/06/2011  . Cervical radiculitis 09/26/2010  . Preventative health care 09/14/2010  . Vitamin D deficiency 01/16/2010  . PERS HX TOBACCO USE PRESENTING HAZARDS HEALTH 09/17/2009  . B12 deficiency 09/06/2008  . INSOMNIA 06/21/2008  . LEG PAIN, BILATERAL 10/06/2007  . Diabetes ([place]) 01/24/2007  . Hyperlipidemia 01/24/2007  . Allergic rhinitis 01/24/2007  . BPH (benign prostatic hypertrophy) 01/24/2007  . Essential hypertension 10/05/2006  . GERD 10/05/2006    Past Surgical History:  Procedure Laterality Date  . CATARACT EXTRACTION     both eyes  . CHOLECYSTECTOMY N/A 07/18/2012   Procedure: LAPAROSCOPIC CHOLECYSTECTOMY WITH  INTRAOPERATIVE CHOLANGIOGRAM;  Surgeon: Zenovia Jarred, MD;  Location: [facility];  Service: General;  Laterality: N/A;  . COLONOSCOPY    . POLYPECTOMY    . POSTERIOR CERVICAL FUSION/FORAMINOTOMY N/A 06/27/2015   Procedure: Posterior Cervical Fusion with lateral mass fixation Cervical four to cervical seven;  Surgeon: Eustace Moore, MD;  Location: [facility];  Service: Neurosurgery;  Laterality: N/A;  . TOTAL KNEE ARTHROPLASTY  2001    Left  . TURP VAPORIZATION         Family History  Problem Relation Age of Onset  . Diabetes Mellitus II Mother   . Lung cancer Brother   . Colon cancer Neg Hx   . Esophageal cancer Neg Hx   . Rectal cancer Neg Hx   . Stomach cancer Neg Hx     Social History   Tobacco Use  . Smoking status: Former Smoker    Packs/day: 0.00    Types: Cigarettes  . Smokeless tobacco: Never Used  . Tobacco comment: quit smoking around 1990  Substance Use Topics  . Alcohol use: No    Alcohol/week: 0.0 standard drinks  . Drug use: No    Home Medications Prior to Admission medications   Medication Sig Start Date End Date Taking? Authorizing Provider  aspirin 81 MG EC tablet Take 81 mg by mouth daily.      [provider]  atorvastatin (LIPITOR) 40 MG tablet TAKE 1 TABLET BY MOUTH  DAILY 12/20/18   [name], MD  carvedilol (COREG) 6.25 MG tablet Take 1 tablet (6.25 mg total) by mouth 2 (two) times daily with a meal. 12/20/18   [name], MD  cetirizine (ZYRTEC) 10 MG tablet Take 1 tablet (10 mg total) by mouth daily. Patient not taking: Reported on 04/06/2019 10/06/18 10/06/19  [name], MD  Cholecalciferol (VITAMIN D3) 1000 UNITS CAPS Take 1 capsule (1,000 Units total) by mouth daily. 03/22/13   [name], MD  glimepiride (AMARYL) 2 MG tablet TAKE 2 TABLET BY MOUTH  DAILY BEFORE BREAKFAST 02/15/19   [name], MD  glucose blood (ONE TOUCH ULTRA TEST) test strip Use as instructed E11.9 09/17/17   [name], MD  HYDROcodone-acetaminophen (NORCO/VICODIN) 5-325 MG tablet Take 1 tablet by mouth every 6 (six) hours as needed for moderate pain. Patient not taking: Reported on 04/06/2019 04/04/18   [name], MD  lidocaine (LIDODERM) 5 % Place 1 patch onto the skin daily. Remove & Discard patch within 12 hours or as directed by MD Patient not taking: Reported on 04/06/2019 04/04/18   [name], MD  losartan (COZAAR) 100 MG tablet Take 1 tablet (100 mg total) by mouth  daily. Take 1 tablet by mouth daily. 11/28/18   [name], MD  Multiple Vitamins-Minerals (MULTIVITAMIN PO) Take 1 tablet by mouth daily.    [provider]  NONFORMULARY OR COMPOUNDED ITEM Peripheral Neuropathy Cream: Bupivacaine 1%, Doxepin 3%, Gabapentin 6%, Pentoxifylline 3%, Topiramate 1% Order faxed to [facility] 04/21/19   Marzetta Board, DPM  pantoprazole (PROTONIX) 40 MG tablet TAKE 1 TABLET BY MOUTH  DAILY 12/20/18   [name], MD  tamsulosin (FLOMAX) 0.4 MG CAPS capsule TAKE 1 CAPSULE BY MOUTH  DAILY 12/20/18   [name], MD  traMADol (ULTRAM) 50 MG tablet Take 1 tablet (50 mg total) by mouth every 6 (six) hours as needed. Patient not taking: Reported on 04/06/2019 12/28/18   [name], MD  triamcinolone (NASACORT) 55 MCG/ACT AERO nasal inhaler Place 2 sprays into the nose daily. Patient not taking: Reported on 04/06/2019 10/06/18   [name], MD    Allergies    Amlodipine, Cymbalta [duloxetine hcl], and Oxycodone  Review of Systems   Review of Systems  Constitutional: Negative for chills and fever.  HENT: Negative for rhinorrhea and sore throat.   Eyes: Negative for visual disturbance.  Respiratory: Positive for shortness of breath. Negative for cough.   Cardiovascular: Negative for chest pain and leg swelling.  Gastrointestinal: Negative for abdominal pain, diarrhea, nausea and vomiting.  Genitourinary: Negative for dysuria.  Musculoskeletal: Negative for back pain and neck pain.  Skin: Negative for rash.  Neurological: Negative for dizziness, light-headedness and headaches.  Hematological: Does not bruise/bleed easily.  Psychiatric/Behavioral: Negative for confusion.    Physical Exam Updated Vital Signs BP (!) 194/81 (BP Location: Left Arm)   Pulse (!) 50   Temp 97.8 F (36.6 C) (Oral)   Resp 17   Ht 1.88 m (_0 )   Wt 83 kg   SpO2 98%   BMI 23.50 kg/m   Physical Exam Vitals and nursing note reviewed.  Constitutional:       Appearance: Normal appearance. He is well-developed.  HENT:     Head: Normocephalic and atraumatic.  Eyes:     Extraocular Movements: Extraocular movements intact.     Conjunctiva/sclera: Conjunctivae normal.     Pupils: Pupils are equal, round, and reactive to light.  Cardiovascular:     Rate and Rhythm: Normal rate and regular rhythm.     Heart sounds: No murmur.  Pulmonary:     Effort: Pulmonary effort is normal. No respiratory distress.     Breath sounds: Normal breath sounds.  Abdominal:     Palpations: Abdomen is soft.     Tenderness: There is no abdominal tenderness.  Musculoskeletal:        General: No swelling.     Cervical back: Normal range of motion and neck supple.  Skin:    General: Skin is warm and dry.  Neurological:     General: No focal deficit present.     Mental Status: He is alert and oriented to person, place, and time.     Cranial Nerves: No cranial nerve deficit.     Sensory: No sensory deficit.     Motor: No weakness.     ED Results / Procedures / Treatments   Labs (all labs ordered are listed, but only abnormal results are displayed) Labs Reviewed - No data to display  EKG None  Radiology DG Chest 2 View  Result Date: 05/18/2019 CLINICAL DATA:  Shortness of breath and fatigue for 2-3 weeks EXAM: CHEST - 2 VIEW COMPARISON:  08/13/2014 FINDINGS: Cardiac shadow is within normal limits. Aortic calcifications are noted. Lungs are well aerated bilaterally. Mild scarring is noted in the bases bilaterally stable from the prior exam. No new focal abnormality is seen. Postsurgical changes in the cervical spine are seen. Degenerative changes of thoracic spine are noted. IMPRESSION: Chronic bibasilar scarring.  No acute abnormality noted. Electronically Signed   By: Inez Catalina M.D.   On: 05/18/2019 16:22    Procedures Procedures (including critical care time)  Medications Ordered in ED Medications - No data to display  ED Course  I have  reviewed the triage vital signs and the nursing notes.  Pertinent labs & imaging results that were available during my care of the patient were reviewed by me  and considered in my medical decision making (see chart for details).    MDM Rules/Calculators/A&P                      Stents of work-up for the shortness of breath to include chest x-ray CT angio chest and lab work.  Labs without any significant abnormality.  CT angio chest without evidence of pulmonary embolus pneumonia or any fluid.  Patient's oxygen saturations here have remained in the high 90s.  Patient's blood pressure has remained elevated.  Will have patient follow-up with primary care doctor for the complaint of exertional shortness of breath.  And for follow-up of his blood pressure.  Patient's BNP was not elevated.  Patient in no acute distress very stable. Final Clinical Impression(s) / ED Diagnoses Final diagnoses:  None    Rx / DC Orders ED Discharge Orders    None       Fredia Sorrow, MD 05/18/19 2339

## 2019-05-19 ENCOUNTER — Other Ambulatory Visit: Payer: Self-pay

## 2019-05-19 ENCOUNTER — Encounter: Payer: Self-pay | Admitting: Internal Medicine

## 2019-05-19 ENCOUNTER — Ambulatory Visit (INDEPENDENT_AMBULATORY_CARE_PROVIDER_SITE_OTHER): Payer: Medicare Other | Admitting: Internal Medicine

## 2019-05-19 VITALS — BP 160/70 | HR 86 | Temp 97.5°F | Ht 74.0 in | Wt 183.0 lb

## 2019-05-19 DIAGNOSIS — N183 Chronic kidney disease, stage 3 unspecified: Secondary | ICD-10-CM | POA: Diagnosis not present

## 2019-05-19 DIAGNOSIS — J45909 Unspecified asthma, uncomplicated: Secondary | ICD-10-CM | POA: Insufficient documentation

## 2019-05-19 DIAGNOSIS — J439 Emphysema, unspecified: Secondary | ICD-10-CM

## 2019-05-19 DIAGNOSIS — E1140 Type 2 diabetes mellitus with diabetic neuropathy, unspecified: Secondary | ICD-10-CM | POA: Diagnosis not present

## 2019-05-19 DIAGNOSIS — I10 Essential (primary) hypertension: Secondary | ICD-10-CM

## 2019-05-19 DIAGNOSIS — J449 Chronic obstructive pulmonary disease, unspecified: Secondary | ICD-10-CM | POA: Insufficient documentation

## 2019-05-19 MED ORDER — CARVEDILOL 12.5 MG PO TABS: 12.5000 mg | ORAL_TABLET | Freq: Two times a day (BID) | ORAL | 3 refills | Status: DC

## 2019-05-19 MED ORDER — ALBUTEROL SULFATE HFA 108 (90 BASE) MCG/ACT IN AERS: 2.0000 | INHALATION_SPRAY | Freq: Four times a day (QID) | RESPIRATORY_TRACT | 11 refills | Status: DC | PRN

## 2019-05-19 MED ORDER — SPIRIVA HANDIHALER 18 MCG IN CAPS: 18.0000 ug | ORAL_CAPSULE | Freq: Every day | RESPIRATORY_TRACT | 12 refills | Status: DC

## 2019-05-19 NOTE — Patient Instructions (Signed)
Please take all new medication as prescribed - the spiriva inhaler every day  Please take all new medication as prescribed  - the albuterol inhaler as needed for more shortness of breath  Ok to increase the carvedilol (coreg) to 12.5 mg twice per day  Please continue all other medications as before, and refills have been done if requested.  Please have the pharmacy call with any other refills you may need.  Please continue your efforts at being more active, low cholesterol diet, and weight control.  Please keep your appointments with your specialists as you may have planned

## 2019-05-19 NOTE — Progress Notes (Signed)
Subjective:    Patient ID: Nicholas Patton, male    DOB: 06/04/1934, 85 y.o.   MRN: [id number]  HPI  Here after recent ED visit with dyspnea, exam and labs benign but cxr with copd.  Pt denies chest pain, wheezing, orthopnea, PND, increased LE swelling, palpitations, dizziness or syncope. Pt denies new neurological symptoms such as new headache, or facial or extremity weakness or numbness    Pt denies polydipsia, polyuria, or low sugar symptoms such as weakness or confusion improved with po intake.  Pt states overall good compliance with meds.   Pt denies fever, wt loss, night sweats, loss of appetite, or other constitutional symptoms Past Medical History:  Diagnosis Date  . Anemia   . Arthritis   . Cataract    REMOVED BILATERAL  . Colon polyps   . DIABETES MELLITUS, TYPE II    takes Metformin and Glimepiride daily  . Diverticulosis   . GERD (gastroesophageal reflux disease)    takes Protonix daily  . History of colon polyps   . HYPERLIPIDEMIA    takes Atorvastatin daily  . HYPERTENSION    takes Amlodipine and Hyzaar daily  . Ileus following gastrointestinal surgery ([place]) 07/22/2012  . Joint pain   . Peripheral neuropathy   . Peripheral neuropathy   . PERIPHERAL NEUROPATHY, FEET 10/06/2007  . Renal disorder   . VITAMIN B12 DEFICIENCY 09/06/2008  . VITAMIN D DEFICIENCY 01/16/2010   takes Vitamin D daily   Past Surgical History:  Procedure Laterality Date  . CATARACT EXTRACTION     both eyes  . CHOLECYSTECTOMY N/A 07/18/2012   Procedure: LAPAROSCOPIC CHOLECYSTECTOMY WITH INTRAOPERATIVE CHOLANGIOGRAM;  Surgeon: Zenovia Jarred, MD;  Location: [facility];  Service: General;  Laterality: N/A;  . COLONOSCOPY    . POLYPECTOMY    . POSTERIOR CERVICAL FUSION/FORAMINOTOMY N/A 06/27/2015   Procedure: Posterior Cervical Fusion with lateral mass fixation Cervical four to cervical seven;  Surgeon: Eustace Moore, MD;  Location: [facility];  Service: Neurosurgery;  Laterality: N/A;  . TOTAL KNEE  ARTHROPLASTY  2001   Left  . TURP VAPORIZATION      reports that he has quit smoking. His smoking use included cigarettes. He smoked 0.00 packs per day. He has never used smokeless tobacco. He reports that he does not drink alcohol or use drugs. family history includes Diabetes Mellitus II in his mother; Lung cancer in his brother. Allergies  Allergen Reactions  . Amlodipine Swelling  . Cymbalta [Duloxetine Hcl]   . Oxycodone    Current Outpatient Medications on File Prior to Visit  Medication Sig Dispense Refill  . aspirin 81 MG EC tablet Take 81 mg by mouth daily.      Marland Kitchen atorvastatin (LIPITOR) 40 MG tablet TAKE 1 TABLET BY MOUTH  DAILY (Patient taking differently: Take 40 mg by mouth daily at 6 PM. ) 90 tablet 3  . cetirizine (ZYRTEC) 10 MG tablet Take 1 tablet (10 mg total) by mouth daily. 30 tablet 11  . Cholecalciferol (VITAMIN D3) 1000 UNITS CAPS Take 1 capsule (1,000 Units total) by mouth daily. 90 capsule 3  . glimepiride (AMARYL) 2 MG tablet TAKE 2 TABLET BY MOUTH  DAILY BEFORE BREAKFAST (Patient taking differently: Take 4 mg by mouth daily with breakfast. ) 180 tablet 3  . glucose blood (ONE TOUCH ULTRA TEST) test strip Use as instructed E11.9 200 each 12  . HYDROcodone-acetaminophen (NORCO/VICODIN) 5-325 MG tablet Take 1 tablet by mouth every 6 (six) hours as  needed for moderate pain. 30 tablet 0  . lidocaine (LIDODERM) 5 % Place 1 patch onto the skin daily. Remove & Discard patch within 12 hours or as directed by MD 30 patch 1  . losartan (COZAAR) 100 MG tablet Take 1 tablet (100 mg total) by mouth daily. Take 1 tablet by mouth daily. 90 tablet 1  . Multiple Vitamins-Minerals (MULTIVITAMIN PO) Take 1 tablet by mouth daily.    . NONFORMULARY OR COMPOUNDED ITEM Peripheral Neuropathy Cream: Bupivacaine 1%, Doxepin 3%, Gabapentin 6%, Pentoxifylline 3%, Topiramate 1% Order faxed to [facility] each 3  . pantoprazole (PROTONIX) 40 MG tablet TAKE 1 TABLET BY MOUTH  DAILY  (Patient taking differently: Take 40 mg by mouth daily. ) 90 tablet 3  . tamsulosin (FLOMAX) 0.4 MG CAPS capsule TAKE 1 CAPSULE BY MOUTH  DAILY (Patient taking differently: Take 0.4 mg by mouth daily. ) 90 capsule 3  . traMADol (ULTRAM) 50 MG tablet Take 1 tablet (50 mg total) by mouth every 6 (six) hours as needed. 30 tablet 1  . triamcinolone (NASACORT) 55 MCG/ACT AERO nasal inhaler Place 2 sprays into the nose daily. 1 Inhaler 12   No current facility-administered medications on file prior to visit.   Review of Systems All otherwise neg per pt    Objective:   Physical Exam BP (!) 160/70   Pulse 86   Temp (!) 97.5 F (36.4 C)   Ht 6' 2" (1.88 m)   Wt 183 lb (83 kg)   SpO2 98%   BMI 23.50 kg/m  VS noted,  Constitutional: Pt appears in NAD HENT: Head: NCAT.  Right Ear: External ear normal.  Left Ear: External ear normal.  Eyes: . Pupils are equal, round, and reactive to light. Conjunctivae and EOM are normal Nose: without d/c or deformity Neck: Neck supple. Gross normal ROM Cardiovascular: Normal rate and regular rhythm.   Pulmonary/Chest: Effort normal and breath sounds without rales or wheezing.  Abd:  Soft, NT, ND, + BS, no organomegaly Neurological: Pt is alert. At baseline orientation, motor grossly intact Skin: Skin is warm. No rashes, other new lesions, no LE edema Psychiatric: Pt behavior is normal without agitation  All otherwise neg per pt  Lab Results  Component Value Date   WBC 4.3 05/18/2019   HGB 14.1 05/18/2019   HCT 44.4 05/18/2019   PLT 166 05/18/2019   GLUCOSE 83 05/18/2019   CHOL 118 04/06/2019   TRIG 55.0 04/06/2019   HDL 53.00 04/06/2019   LDLCALC 54 04/06/2019   ALT 24 05/18/2019   AST 26 05/18/2019   NA 144 05/18/2019   K 4.6 05/18/2019   CL 109 05/18/2019   CREATININE 1.61 (H) 05/18/2019   BUN 29 (H) 05/18/2019   CO2 26 05/18/2019   TSH 4.48 04/06/2019   PSA 4.61 (H) 04/09/2017   INR 1.12 06/19/2015   HGBA1C 7.0 (H) 04/06/2019    MICROALBUR 0.8 04/06/2019      Assessment & Plan:

## 2019-05-21 ENCOUNTER — Encounter: Payer: Self-pay | Admitting: Internal Medicine

## 2019-05-21 NOTE — Assessment & Plan Note (Signed)
Uncontrolled, for increased coreg 12.5 bid

## 2019-05-21 NOTE — Assessment & Plan Note (Signed)
stable overall by history and exam, recent data reviewed with pt, and pt to continue medical treatment as before,  to f/u any worsening symptoms or concerns  

## 2019-05-21 NOTE — Assessment & Plan Note (Addendum)
Newly symptomatic, for spiriva daily and albuterol HFA prn,  to f/u any worsening symptoms or concerns  I spent 70mnutes in preparing to see the patient by review of recent labs, imaging and procedures, obtaining and reviewing separately obtained history, communicating with the patient and family or caregiver, ordering medications, tests or procedures, and documenting clinical information in the EHR including the differential Dx, treatment, and any further evaluation and other management of copd, HTN, DM, CKD

## 2019-05-21 NOTE — Assessment & Plan Note (Signed)
stable overall by history and exam, recent data reviewed with pt, and pt to continue medical treatment as before,  to f/u any worsening symptoms or concerns

## 2019-06-07 ENCOUNTER — Encounter: Payer: Self-pay | Admitting: Internal Medicine

## 2019-06-07 DIAGNOSIS — H04123 Dry eye syndrome of bilateral lacrimal glands: Secondary | ICD-10-CM | POA: Diagnosis not present

## 2019-06-07 DIAGNOSIS — H40013 Open angle with borderline findings, low risk, bilateral: Secondary | ICD-10-CM | POA: Diagnosis not present

## 2019-06-07 DIAGNOSIS — Z961 Presence of intraocular lens: Secondary | ICD-10-CM | POA: Diagnosis not present

## 2019-06-07 DIAGNOSIS — E119 Type 2 diabetes mellitus without complications: Secondary | ICD-10-CM | POA: Diagnosis not present

## 2019-06-07 DIAGNOSIS — H4322 Crystalline deposits in vitreous body, left eye: Secondary | ICD-10-CM | POA: Diagnosis not present

## 2019-06-07 DIAGNOSIS — H43821 Vitreomacular adhesion, right eye: Secondary | ICD-10-CM | POA: Diagnosis not present

## 2019-06-07 LAB — HM DIABETES EYE EXAM

## 2019-06-07 NOTE — Progress Notes (Signed)
Outside notes received. Information abstracted. Notes sent to scan. 

## 2019-06-14 DIAGNOSIS — N401 Enlarged prostate with lower urinary tract symptoms: Secondary | ICD-10-CM | POA: Diagnosis not present

## 2019-06-14 DIAGNOSIS — R351 Nocturia: Secondary | ICD-10-CM | POA: Diagnosis not present

## 2019-06-14 DIAGNOSIS — R972 Elevated prostate specific antigen [PSA]: Secondary | ICD-10-CM | POA: Diagnosis not present

## 2019-06-20 ENCOUNTER — Other Ambulatory Visit: Payer: Self-pay | Admitting: Internal Medicine

## 2019-07-20 ENCOUNTER — Ambulatory Visit (INDEPENDENT_AMBULATORY_CARE_PROVIDER_SITE_OTHER): Payer: Medicare Other | Admitting: Podiatry

## 2019-07-20 ENCOUNTER — Other Ambulatory Visit: Payer: Self-pay

## 2019-07-20 ENCOUNTER — Encounter: Payer: Self-pay | Admitting: Podiatry

## 2019-07-20 DIAGNOSIS — L84 Corns and callosities: Secondary | ICD-10-CM | POA: Diagnosis not present

## 2019-07-20 DIAGNOSIS — M79675 Pain in left toe(s): Secondary | ICD-10-CM

## 2019-07-20 DIAGNOSIS — E1142 Type 2 diabetes mellitus with diabetic polyneuropathy: Secondary | ICD-10-CM | POA: Diagnosis not present

## 2019-07-20 DIAGNOSIS — B351 Tinea unguium: Secondary | ICD-10-CM | POA: Diagnosis not present

## 2019-07-20 DIAGNOSIS — M79674 Pain in right toe(s): Secondary | ICD-10-CM | POA: Diagnosis not present

## 2019-07-20 NOTE — Patient Instructions (Signed)
Diabetes Mellitus and Foot Care Foot care is an important part of your health, especially when you have diabetes. Diabetes may cause you to have problems because of poor blood flow (circulation) to your feet and legs, which can cause your skin to:  Become thinner and drier.  Break more easily.  Heal more slowly.  Peel and crack. You may also have nerve damage (neuropathy) in your legs and feet, causing decreased feeling in them. This means that you may not notice minor injuries to your feet that could lead to more serious problems. Noticing and addressing any potential problems early is the best way to prevent future foot problems. How to care for your feet Foot hygiene  Wash your feet daily with warm water and mild soap. Do not use hot water. Then, pat your feet and the areas between your toes until they are completely dry. Do not soak your feet as this can dry your skin.  Trim your toenails straight across. Do not dig under them or around the cuticle. File the edges of your nails with an emery board or nail file.  Apply a moisturizing lotion or petroleum jelly to the skin on your feet and to dry, brittle toenails. Use lotion that does not contain alcohol and is unscented. Do not apply lotion between your toes. Shoes and socks  Wear clean socks or stockings every day. Make sure they are not too tight. Do not wear knee-high stockings since they may decrease blood flow to your legs.  Wear shoes that fit properly and have enough cushioning. Always look in your shoes before you put them on to be sure there are no objects inside.  To break in new shoes, wear them for just a few hours a day. This prevents injuries on your feet. Wounds, scrapes, corns, and calluses  Check your feet daily for blisters, cuts, bruises, sores, and redness. If you cannot see the bottom of your feet, use a mirror or ask someone for help.  Do not cut corns or calluses or try to remove them with medicine.  If you  find a minor scrape, cut, or break in the skin on your feet, keep it and the skin around it clean and dry. You may clean these areas with mild soap and water. Do not clean the area with peroxide, alcohol, or iodine.  If you have a wound, scrape, corn, or callus on your foot, look at it several times a day to make sure it is healing and not infected. Check for: ? Redness, swelling, or pain. ? Fluid or blood. ? Warmth. ? Pus or a bad smell. General instructions  Do not cross your legs. This may decrease blood flow to your feet.  Do not use heating pads or hot water bottles on your feet. They may burn your skin. If you have lost feeling in your feet or legs, you may not know this is happening until it is too late.  Protect your feet from hot and cold by wearing shoes, such as at the beach or on hot pavement.  Schedule a complete foot exam at least once a year (annually) or more often if you have foot problems. If you have foot problems, report any cuts, sores, or bruises to your health care provider immediately. Contact a health care provider if:  You have a medical condition that increases your risk of infection and you have any cuts, sores, or bruises on your feet.  You have an injury that is not   healing.  You have redness on your legs or feet.  You feel burning or tingling in your legs or feet.  You have pain or cramps in your legs and feet.  Your legs or feet are numb.  Your feet always feel cold.  You have pain around a toenail. Get help right away if:  You have a wound, scrape, corn, or callus on your foot and: ? You have pain, swelling, or redness that gets worse. ? You have fluid or blood coming from the wound, scrape, corn, or callus. ? Your wound, scrape, corn, or callus feels warm to the touch. ? You have pus or a bad smell coming from the wound, scrape, corn, or callus. ? You have a fever. ? You have a red line going up your leg. Summary  Check your feet every day  for cuts, sores, red spots, swelling, and blisters.  Moisturize feet and legs daily.  Wear shoes that fit properly and have enough cushioning.  If you have foot problems, report any cuts, sores, or bruises to your health care provider immediately.  Schedule a complete foot exam at least once a year (annually) or more often if you have foot problems. This information is not intended to replace advice given to you by your health care provider. Make sure you discuss any questions you have with your health care provider. Document Revised: 11/16/2018 Document Reviewed: 03/27/2016 Elsevier Patient Education  2020 [employer]  Corns and Calluses Corns are small areas of thickened skin that occur on the top, sides, or tip of a toe. They contain a cone-shaped core with a point that can press on a nerve below. This causes pain.  Calluses are areas of thickened skin that can occur anywhere on the body, including the hands, fingers, palms, soles of the feet, and heels. Calluses are usually larger than corns. What are the causes? Corns and calluses are caused by rubbing (friction) or pressure, such as from shoes that are too tight or do not fit properly. What increases the risk? Corns are more likely to develop in people who have misshapen toes (toe deformities), such as hammer toes. Calluses can occur with friction to any area of the skin. They are more likely to develop in people who:  Work with their hands.  Wear shoes that fit poorly, are too tight, or are high-heeled.  Have toe deformities. What are the signs or symptoms? Symptoms of a corn or callus include:  A hard growth on the skin.  Pain or tenderness under the skin.  Redness and swelling.  Increased discomfort while wearing tight-fitting shoes, if your feet are affected. If a corn or callus becomes infected, symptoms may include:  Redness and swelling that gets worse.  Pain.  Fluid, blood, or pus draining from the corn or  callus. How is this diagnosed? Corns and calluses may be diagnosed based on your symptoms, your medical history, and a physical exam. How is this treated? Treatment for corns and calluses may include:  Removing the cause of the friction or pressure. This may involve: ? Changing your shoes. ? Wearing shoe inserts (orthotics) or other protective layers in your shoes, such as a corn pad. ? Wearing gloves.  Applying medicine to the skin (topical medicine) to help soften skin in the hardened, thickened areas.  Removing layers of dead skin with a file to reduce the size of the corn or callus.  Removing the corn or callus with a scalpel or laser.  Taking   antibiotic medicines, if your corn or callus is infected.  Having surgery, if a toe deformity is the cause. Follow these instructions at home:   Take over-the-counter and prescription medicines only as told by your health care provider.  If you were prescribed an antibiotic, take it as told by your health care provider. Do not stop taking it even if your condition starts to improve.  Wear shoes that fit well. Avoid wearing high-heeled shoes and shoes that are too tight or too loose.  Wear any padding, protective layers, gloves, or orthotics as told by your health care provider.  Soak your hands or feet and then use a file or pumice stone to soften your corn or callus. Do this as told by your health care provider.  Check your corn or callus every day for symptoms of infection. Contact a health care provider if you:  Notice that your symptoms do not improve with treatment.  Have redness or swelling that gets worse.  Notice that your corn or callus becomes painful.  Have fluid, blood, or pus coming from your corn or callus.  Have new symptoms. Summary  Corns are small areas of thickened skin that occur on the top, sides, or tip of a toe.  Calluses are areas of thickened skin that can occur anywhere on the body, including the  hands, fingers, palms, and soles of the feet. Calluses are usually larger than corns.  Corns and calluses are caused by rubbing (friction) or pressure, such as from shoes that are too tight or do not fit properly.  Treatment may include wearing any padding, protective layers, gloves, or orthotics as told by your health care provider. This information is not intended to replace advice given to you by your health care provider. Make sure you discuss any questions you have with your health care provider. Document Revised: 06/15/2018 Document Reviewed: 01/06/2017 Elsevier Patient Education  [place].  Peripheral Neuropathy Peripheral neuropathy is a type of nerve damage. It affects nerves that carry signals between the spinal cord and the arms, legs, and the rest of the body (peripheral nerves). It does not affect nerves in the spinal cord or brain. In peripheral neuropathy, one nerve or a group of nerves may be damaged. Peripheral neuropathy is a broad category that includes many specific nerve disorders, like diabetic neuropathy, hereditary neuropathy, and carpal tunnel syndrome. What are the causes? This condition may be caused by:  Diabetes. This is the most common cause of peripheral neuropathy.  Nerve injury.  Pressure or stress on a nerve that lasts a long time.  Lack (deficiency) of B vitamins. This can result from alcoholism, poor diet, or a restricted diet.  Infections.  Autoimmune diseases, such as rheumatoid arthritis and systemic lupus erythematosus.  Nerve diseases that are passed from parent to child (inherited).  Some medicines, such as cancer medicines (chemotherapy).  Poisonous (toxic) substances, such as lead and mercury.  Too little blood flowing to the legs.  Kidney disease.  Thyroid disease. In some cases, the cause of this condition is not known. What are the signs or symptoms? Symptoms of this condition depend on which of your nerves is damaged.  Common symptoms include:  Loss of feeling (numbness) in the feet, hands, or both.  Tingling in the feet, hands, or both.  Burning pain.  Very sensitive skin.  Weakness.  Not being able to move a part of the body (paralysis).  Muscle twitching.  Clumsiness or poor coordination.  Loss of balance.  Not being able to control your bladder.  Feeling dizzy.  Sexual problems. How is this diagnosed? Diagnosing and finding the cause of peripheral neuropathy can be difficult. Your health care provider will take your medical history and do a physical exam. A neurological exam will also be done. This involves checking things that are affected by your brain, spinal cord, and nerves (nervous system). For example, your health care provider will check your reflexes, how you move, and what you can feel. You may have other tests, such as:  Blood tests.  Electromyogram (EMG) and nerve conduction tests. These tests check nerve function and how well the nerves are controlling the muscles.  Imaging tests, such as CT scans or MRI to rule out other causes of your symptoms.  Removing a small piece of nerve to be examined in a lab (nerve biopsy). This is rare.  Removing and examining a small amount of the fluid that surrounds the brain and spinal cord (lumbar puncture). This is rare. How is this treated? Treatment for this condition may involve:  Treating the underlying cause of the neuropathy, such as diabetes, kidney disease, or vitamin deficiencies.  Stopping medicines that can cause neuropathy, such as chemotherapy.  Medicine to relieve pain. Medicines may include: ? Prescription or over-the-counter pain medicine. ? Antiseizure medicine. ? Antidepressants. ? Pain-relieving patches that are applied to painful areas of skin.  Surgery to relieve pressure on a nerve or to destroy a nerve that is causing pain.  Physical therapy to help improve movement and balance.  Devices to help you  move around (assistive devices). Follow these instructions at home: Medicines  Take over-the-counter and prescription medicines only as told by your health care provider. Do not take any other medicines without first asking your health care provider.  Do not drive or use heavy machinery while taking prescription pain medicine. Lifestyle   Do not use any products that contain nicotine or tobacco, such as cigarettes and e-cigarettes. Smoking keeps blood from reaching damaged nerves. If you need help quitting, ask your health care provider.  Avoid or limit alcohol. Too much alcohol can cause a vitamin B deficiency, and vitamin B is needed for healthy nerves.  Eat a healthy diet. This includes: ? Eating foods that are high in fiber, such as fresh fruits and vegetables, whole grains, and beans. ? Limiting foods that are high in fat and processed sugars, such as fried or sweet foods. General instructions   If you have diabetes, work closely with your health care provider to keep your blood sugar under control.  If you have numbness in your feet: ? Check every day for signs of injury or infection. Watch for redness, warmth, and swelling. ? Wear padded socks and comfortable shoes. These help protect your feet.  Develop a good support system. Living with peripheral neuropathy can be stressful. Consider talking with a mental health specialist or joining a support group.  Use assistive devices and attend physical therapy as told by your health care provider. This may include using a walker or a cane.  Keep all follow-up visits as told by your health care provider. This is important. Contact a health care provider if:  You have new signs or symptoms of peripheral neuropathy.  You are struggling emotionally from dealing with peripheral neuropathy.  Your pain is not well-controlled. Get help right away if:  You have an injury or infection that is not healing normally.  You develop new  weakness in an arm or leg.  You fall frequently. Summary  Peripheral neuropathy is when the nerves in the arms, or legs are damaged, resulting in numbness, weakness, or pain.  There are many causes of peripheral neuropathy, including diabetes, pinched nerves, vitamin deficiencies, autoimmune disease, and hereditary conditions.  Diagnosing and finding the cause of peripheral neuropathy can be difficult. Your health care provider will take your medical history, do a physical exam, and do tests, including blood tests and nerve function tests.  Treatment involves treating the underlying cause of the neuropathy and taking medicines to help control pain. Physical therapy and assistive devices may also help. This information is not intended to replace advice given to you by your health care provider. Make sure you discuss any questions you have with your health care provider. Document Revised: 02/05/2017 Document Reviewed: 05/04/2016 Elsevier Patient Education  2020 [employer].

## 2019-07-21 ENCOUNTER — Ambulatory Visit: Payer: Medicare Other | Admitting: Podiatry

## 2019-07-29 NOTE — Progress Notes (Signed)
Subjective: Nicholas Patton presents today at risk foot care with history of diabetic neuropathy and callus(es) b/l feet and painful mycotic toenails b/l that are difficult to trim. Pain interferes with ambulation. Aggravating factors include wearing enclosed shoe gear. Pain is relieved with periodic professional debridement.  [name], MD is patient's PCP. Last visit was: 05/19/2019.  Past Medical History:  Diagnosis Date  . Anemia   . Arthritis   . Cataract    REMOVED BILATERAL  . Colon polyps   . DIABETES MELLITUS, TYPE II    takes Metformin and Glimepiride daily  . Diverticulosis   . GERD (gastroesophageal reflux disease)    takes Protonix daily  . History of colon polyps   . HYPERLIPIDEMIA    takes Atorvastatin daily  . HYPERTENSION    takes Amlodipine and Hyzaar daily  . Ileus following gastrointestinal surgery ([place]) 07/22/2012  . Joint pain   . Peripheral neuropathy   . Peripheral neuropathy   . PERIPHERAL NEUROPATHY, FEET 10/06/2007  . Renal disorder   . VITAMIN B12 DEFICIENCY 09/06/2008  . VITAMIN D DEFICIENCY 01/16/2010   takes Vitamin D daily     Current Outpatient Medications on File Prior to Visit  Medication Sig Dispense Refill  . losartan (COZAAR) 100 MG tablet TAKE 1 TABLET BY MOUTH  DAILY 90 tablet 2  . albuterol (VENTOLIN HFA) 108 (90 Base) MCG/ACT inhaler Inhale 2 puffs into the lungs every 6 (six) hours as needed for wheezing or shortness of breath. 18 g 11  . aspirin 81 MG EC tablet Take 81 mg by mouth daily.      Marland Kitchen atorvastatin (LIPITOR) 40 MG tablet TAKE 1 TABLET BY MOUTH  DAILY (Patient taking differently: Take 40 mg by mouth daily at 6 PM. ) 90 tablet 3  . carvedilol (COREG) 12.5 MG tablet Take 1 tablet (12.5 mg total) by mouth 2 (two) times daily with a meal. 180 tablet 3  . cetirizine (ZYRTEC) 10 MG tablet Take 1 tablet (10 mg total) by mouth daily. 30 tablet 11  . Cholecalciferol (VITAMIN D3) 1000 UNITS CAPS Take 1 capsule (1,000 Units total) by  mouth daily. 90 capsule 3  . glimepiride (AMARYL) 2 MG tablet TAKE 2 TABLET BY MOUTH  DAILY BEFORE BREAKFAST (Patient taking differently: Take 4 mg by mouth daily with breakfast. ) 180 tablet 3  . glucose blood (ONE TOUCH ULTRA TEST) test strip Use as instructed E11.9 200 each 12  . HYDROcodone-acetaminophen (NORCO/VICODIN) 5-325 MG tablet Take 1 tablet by mouth every 6 (six) hours as needed for moderate pain. 30 tablet 0  . lidocaine (LIDODERM) 5 % Place 1 patch onto the skin daily. Remove & Discard patch within 12 hours or as directed by MD 30 patch 1  . Multiple Vitamins-Minerals (MULTIVITAMIN PO) Take 1 tablet by mouth daily.    . NONFORMULARY OR COMPOUNDED ITEM Peripheral Neuropathy Cream: Bupivacaine 1%, Doxepin 3%, Gabapentin 6%, Pentoxifylline 3%, Topiramate 1% Order faxed to [facility] 1 each 3  . pantoprazole (PROTONIX) 40 MG tablet TAKE 1 TABLET BY MOUTH  DAILY (Patient taking differently: Take 40 mg by mouth daily. ) 90 tablet 3  . tamsulosin (FLOMAX) 0.4 MG CAPS capsule TAKE 1 CAPSULE BY MOUTH  DAILY (Patient taking differently: Take 0.4 mg by mouth daily. ) 90 capsule 3  . tiotropium (SPIRIVA HANDIHALER) 18 MCG inhalation capsule Place 1 capsule (18 mcg total) into inhaler and inhale daily. 30 capsule 12  . traMADol (ULTRAM) 50 MG tablet Take  1 tablet (50 mg total) by mouth every 6 (six) hours as needed. 30 tablet 1  . triamcinolone (NASACORT) 55 MCG/ACT AERO nasal inhaler Place 2 sprays into the nose daily. 1 Inhaler 12   No current facility-administered medications on file prior to visit.     Allergies  Allergen Reactions  . Amlodipine Swelling  . Cymbalta [Duloxetine Hcl]   . Oxycodone     Objective: Nicholas Patton is a pleasant 85 y.o. y.o. Patient Race: Black or African American [2]  male in NAD. AAO x 3.  There were no vitals filed for this visit.  Vascular Examination: Capillary refill time to digits immediate b/l. Palpable DP pulses b/l. Palpable PT  pulses b/l. Pedal hair absent b/l Skin temperature gradient within normal limits b/l. No edema noted b/l.  Dermatological Examination: Pedal skin with normal turgor, texture and tone bilaterally. No open wounds bilaterally. No interdigital macerations bilaterally. Toenails 1-5 b/l elongated, dystrophic, thickened, crumbly with subungual debris and tenderness to dorsal palpation. Hyperkeratotic lesion(s) L hallux, L 2nd toe, submet head 1 left foot and submet head 1 right foot.  No erythema, no edema, no drainage, no flocculence.  Musculoskeletal: Normal muscle strength 5/5 to all lower extremity muscle groups bilaterally. No pain crepitus or joint limitation noted with ROM b/l. Hallux valgus with bunion deformity noted b/l. Hammertoes noted to the 2-5 bilaterally.  Neurological Examination: Protective sensation intact 5/5 intact bilaterally with 10g monofilament b/l. Patient has subjective symptoms of neuropathy. Babinski reflex negative b/l. Clonus negative b/l.  Assessment: No diagnosis found.  Plan: -Examined patient. -No new findings. No new orders. -Continue diabetic foot care principles. Literature dispensed on today.  -Toenails 1-5 b/l were debrided in length and girth with sterile nail nippers and dremel without iatrogenic bleeding.  -Corn(s) L hallux and L 2nd toe and callus(es) submet head 1 left foot and submet head 1 right foot were pared utilizing sterile scalpel blade without incident. Total number debrided =4. -Patient to continue soft, supportive shoe gear daily. -Patient to report any pedal injuries to medical professional immediately. -Dispensed Silipos toe caps for left hallux and second digit for interdigital corns. Apply every morning. Remove every evening. -Patient/POA to call should there be question/concern in the interim.  Return in about 3 months (around 10/20/2019) for diabetic nail and callus trim.  Marzetta Board, DPM

## 2019-09-18 ENCOUNTER — Other Ambulatory Visit: Payer: Self-pay

## 2019-09-18 MED ORDER — SPIRIVA HANDIHALER 18 MCG IN CAPS: 18.0000 ug | ORAL_CAPSULE | Freq: Every day | RESPIRATORY_TRACT | 12 refills | Status: DC

## 2019-10-04 ENCOUNTER — Ambulatory Visit (INDEPENDENT_AMBULATORY_CARE_PROVIDER_SITE_OTHER): Payer: Medicare Other | Admitting: Internal Medicine

## 2019-10-04 ENCOUNTER — Encounter: Payer: Self-pay | Admitting: Internal Medicine

## 2019-10-04 ENCOUNTER — Other Ambulatory Visit: Payer: Self-pay

## 2019-10-04 VITALS — BP 130/62 | HR 72 | Temp 97.8°F | Ht 74.0 in | Wt 182.0 lb

## 2019-10-04 DIAGNOSIS — N183 Chronic kidney disease, stage 3 unspecified: Secondary | ICD-10-CM

## 2019-10-04 DIAGNOSIS — R0600 Dyspnea, unspecified: Secondary | ICD-10-CM

## 2019-10-04 DIAGNOSIS — R21 Rash and other nonspecific skin eruption: Secondary | ICD-10-CM | POA: Diagnosis not present

## 2019-10-04 DIAGNOSIS — I10 Essential (primary) hypertension: Secondary | ICD-10-CM

## 2019-10-04 DIAGNOSIS — E538 Deficiency of other specified B group vitamins: Secondary | ICD-10-CM

## 2019-10-04 DIAGNOSIS — E1140 Type 2 diabetes mellitus with diabetic neuropathy, unspecified: Secondary | ICD-10-CM | POA: Diagnosis not present

## 2019-10-04 DIAGNOSIS — J439 Emphysema, unspecified: Secondary | ICD-10-CM | POA: Diagnosis not present

## 2019-10-04 DIAGNOSIS — E559 Vitamin D deficiency, unspecified: Secondary | ICD-10-CM | POA: Diagnosis not present

## 2019-10-04 DIAGNOSIS — E785 Hyperlipidemia, unspecified: Secondary | ICD-10-CM

## 2019-10-04 MED ORDER — KETOCONAZOLE 2 % EX CREA: 1.0000 "application " | TOPICAL_CREAM | Freq: Every day | CUTANEOUS | 0 refills | Status: DC

## 2019-10-04 NOTE — Progress Notes (Signed)
Subjective:    Patient ID: Nicholas Patton, male    DOB: 1934-08-30, 85 y.o.   MRN: [id number]  HPI  Here to f/u; overall doing ok,  Pt denies chest pain, wheezing, orthopnea, PND, increased LE swelling, palpitations, dizziness or syncope, but has had worsening sob.doe with having to stop now to rest after 100 yds..  Pt denies new neurological symptoms such as new headache, or facial or extremity weakness or numbness.  Pt denies polydipsia, polyuria, or low sugar episode.  Pt states overall good compliance with meds,.  Also has a rash to right scrotum, with slight itching, asks for cream.   Pt denies fever, wt loss, night sweats, loss of appetite, or other constitutional symptoms  Denies worsening depressive symptoms, suicidal ideation, or panic Past Medical History:  Diagnosis Date  . Anemia   . Arthritis   . Cataract    REMOVED BILATERAL  . Colon polyps   . DIABETES MELLITUS, TYPE II    takes Metformin and Glimepiride daily  . Diverticulosis   . GERD (gastroesophageal reflux disease)    takes Protonix daily  . History of colon polyps   . HYPERLIPIDEMIA    takes Atorvastatin daily  . HYPERTENSION    takes Amlodipine and Hyzaar daily  . Ileus following gastrointestinal surgery ([place]) 07/22/2012  . Joint pain   . Peripheral neuropathy   . Peripheral neuropathy   . PERIPHERAL NEUROPATHY, FEET 10/06/2007  . Renal disorder   . VITAMIN B12 DEFICIENCY 09/06/2008  . VITAMIN D DEFICIENCY 01/16/2010   takes Vitamin D daily   Past Surgical History:  Procedure Laterality Date  . CATARACT EXTRACTION     both eyes  . CHOLECYSTECTOMY N/A 07/18/2012   Procedure: LAPAROSCOPIC CHOLECYSTECTOMY WITH INTRAOPERATIVE CHOLANGIOGRAM;  Surgeon: Zenovia Jarred, MD;  Location: [facility];  Service: General;  Laterality: N/A;  . COLONOSCOPY    . POLYPECTOMY    . POSTERIOR CERVICAL FUSION/FORAMINOTOMY N/A 06/27/2015   Procedure: Posterior Cervical Fusion with lateral mass fixation Cervical four to cervical seven;   Surgeon: Eustace Moore, MD;  Location: [facility];  Service: Neurosurgery;  Laterality: N/A;  . TOTAL KNEE ARTHROPLASTY  2001   Left  . TURP VAPORIZATION      reports that he has quit smoking. His smoking use included cigarettes. He smoked 0.00 packs per day. He has never used smokeless tobacco. He reports that he does not drink alcohol and does not use drugs. family history includes Diabetes Mellitus II in his mother; Lung cancer in his brother. Allergies  Allergen Reactions  . Amlodipine Swelling  . Cymbalta [Duloxetine Hcl]   . Oxycodone    Current Outpatient Medications on File Prior to Visit  Medication Sig Dispense Refill  . aspirin 81 MG EC tablet Take 81 mg by mouth daily.      Marland Kitchen atorvastatin (LIPITOR) 40 MG tablet TAKE 1 TABLET BY MOUTH  DAILY (Patient taking differently: Take 40 mg by mouth daily at 6 PM. ) 90 tablet 3  . carvedilol (COREG) 12.5 MG tablet Take 1 tablet (12.5 mg total) by mouth 2 (two) times daily with a meal. 180 tablet 3  . Cholecalciferol (VITAMIN D3) 1000 UNITS CAPS Take 1 capsule (1,000 Units total) by mouth daily. 90 capsule 3  . glimepiride (AMARYL) 2 MG tablet TAKE 2 TABLET BY MOUTH  DAILY BEFORE BREAKFAST (Patient taking differently: Take 4 mg by mouth daily with breakfast. ) 180 tablet 3  . glucose blood (ONE TOUCH ULTRA  TEST) test strip Use as instructed E11.9 200 each 12  . lidocaine (LIDODERM) 5 % Place 1 patch onto the skin daily. Remove & Discard patch within 12 hours or as directed by MD 30 patch 1  . losartan (COZAAR) 100 MG tablet TAKE 1 TABLET BY MOUTH  DAILY 90 tablet 2  . Multiple Vitamins-Minerals (MULTIVITAMIN PO) Take 1 tablet by mouth daily.    . NONFORMULARY OR COMPOUNDED ITEM Peripheral Neuropathy Cream: Bupivacaine 1%, Doxepin 3%, Gabapentin 6%, Pentoxifylline 3%, Topiramate 1% Order faxed to [facility] 1 each 3  . pantoprazole (PROTONIX) 40 MG tablet TAKE 1 TABLET BY MOUTH  DAILY (Patient taking differently: Take 40 mg by  mouth daily. ) 90 tablet 3  . tamsulosin (FLOMAX) 0.4 MG CAPS capsule TAKE 1 CAPSULE BY MOUTH  DAILY (Patient taking differently: Take 0.4 mg by mouth daily. ) 90 capsule 3  . tiotropium (SPIRIVA HANDIHALER) 18 MCG inhalation capsule Place 1 capsule (18 mcg total) into inhaler and inhale daily. 30 capsule 12   No current facility-administered medications on file prior to visit.   Review of Systems All otherwise neg per pt     Objective:   Physical Exam BP (!) 130/62 (BP Location: Left Arm, Patient Position: Sitting, Cuff Size: Large)   Pulse 72   Temp 97.8 F (36.6 C) (Oral)   Ht _0  (1.88 m)   Wt 182 lb (82.6 kg)   SpO2 95%   BMI 23.37 kg/m  VS noted,  Constitutional: Pt appears in NAD HENT: Head: NCAT.  Right Ear: External ear normal.  Left Ear: External ear normal.  Eyes: . Pupils are equal, round, and reactive to light. Conjunctivae and EOM are normal Nose: without d/c or deformity Neck: Neck supple. Gross normal ROM Cardiovascular: Normal rate and regular rhythm.   Pulmonary/Chest: Effort normal and breath sounds without rales or wheezing.  Abd:  Soft, NT, ND, + BS, no organomegaly Neurological: Pt is alert. At baseline orientation, motor grossly intact Skin: Skin is warm. No rashes, other new lesions, no LE edema Psychiatric: Pt behavior is normal without agitation  All otherwise neg per pt Lab Results  Component Value Date   WBC 3.5 (L) 10/04/2019   HGB 14.5 10/04/2019   HCT 44.1 10/04/2019   PLT 152 10/04/2019   GLUCOSE 141 (H) 10/04/2019   CHOL 129 10/04/2019   TRIG 75 10/04/2019   HDL 49 10/04/2019   LDLCALC 64 10/04/2019   ALT 17 10/04/2019   AST 22 10/04/2019   NA 142 10/04/2019   K 4.4 10/04/2019   CL 108 10/04/2019   CREATININE 1.68 (H) 10/04/2019   BUN 25 10/04/2019   CO2 28 10/04/2019   TSH 4.48 04/06/2019   PSA 4.61 (H) 04/09/2017   INR 1.12 06/19/2015   HGBA1C 6.6 (H) 10/04/2019   MICROALBUR 0.8 04/06/2019         Assessment & Plan:

## 2019-10-04 NOTE — Patient Instructions (Signed)
Please take all new medication as prescribed - the anti fungal cream for the itching area  Please continue all other medications as before, and refills have been done if requested.  Please have the pharmacy call with any other refills you may need.  Please continue your efforts at being more active, low cholesterol diet, and weight control.  Please keep your appointments with your specialists as you may have planned  You will be contacted regarding the referral for: Lung testing (called pulmonary function testing) and Pulmonary referral  Please go to the LAB at the blood drawing area for the tests to be done  You will be contacted by phone if any changes need to be made immediately.  Otherwise, you will receive a letter about your results with an explanation, but please check with MyChart first.  Please remember to sign up for MyChart if you have not done so, as this will be important to you in the future with finding out test results, communicating by private email, and scheduling acute appointments online when needed.  Please make an Appointment to return in 6 months, or sooner if needed

## 2019-10-05 ENCOUNTER — Encounter: Payer: Self-pay | Admitting: Internal Medicine

## 2019-10-05 LAB — CBC WITH DIFFERENTIAL/PLATELET
Absolute Monocytes: 340 cells/uL (ref 200–950)
Basophils Absolute: 32 cells/uL (ref 0–200)
Basophils Relative: 0.9 %
Eosinophils Absolute: 161 cells/uL (ref 15–500)
Eosinophils Relative: 4.6 %
HCT: 44.1 % (ref 38.5–50.0)
Hemoglobin: 14.5 g/dL (ref 13.2–17.1)
Lymphs Abs: 1113 cells/uL (ref 850–3900)
MCH: 29.6 pg (ref 27.0–33.0)
MCHC: 32.9 g/dL (ref 32.0–36.0)
MCV: 90 fL (ref 80.0–100.0)
MPV: 11.1 fL (ref 7.5–12.5)
Monocytes Relative: 9.7 %
Neutro Abs: 1855 cells/uL (ref 1500–7800)
Neutrophils Relative %: 53 %
Platelets: 152 10*3/uL (ref 140–400)
RBC: 4.9 10*6/uL (ref 4.20–5.80)
RDW: 13.2 % (ref 11.0–15.0)
Total Lymphocyte: 31.8 %
WBC: 3.5 10*3/uL — ABNORMAL LOW (ref 3.8–10.8)

## 2019-10-05 LAB — COMPLETE METABOLIC PANEL WITH GFR
AG Ratio: 2 (calc) (ref 1.0–2.5)
ALT: 17 U/L (ref 9–46)
AST: 22 U/L (ref 10–35)
Albumin: 4.3 g/dL (ref 3.6–5.1)
Alkaline phosphatase (APISO): 106 U/L (ref 35–144)
BUN/Creatinine Ratio: 15 (calc) (ref 6–22)
BUN: 25 mg/dL (ref 7–25)
CO2: 28 mmol/L (ref 20–32)
Calcium: 9.6 mg/dL (ref 8.6–10.3)
Chloride: 108 mmol/L (ref 98–110)
Creat: 1.68 mg/dL — ABNORMAL HIGH (ref 0.70–1.11)
GFR, Est African American: 43 mL/min/{1.73_m2} — ABNORMAL LOW (ref >=60)
GFR, Est Non African American: 37 mL/min/{1.73_m2} — ABNORMAL LOW (ref >=60)
Globulin: 2.2 g/dL (calc) (ref 1.9–3.7)
Glucose, Bld: 141 mg/dL — ABNORMAL HIGH (ref 65–99)
Potassium: 4.4 mmol/L (ref 3.5–5.3)
Sodium: 142 mmol/L (ref 135–146)
Total Bilirubin: 0.6 mg/dL (ref 0.2–1.2)
Total Protein: 6.5 g/dL (ref 6.1–8.1)

## 2019-10-05 LAB — VITAMIN B12: Vitamin B-12: 435 pg/mL (ref 200–1100)

## 2019-10-05 LAB — HEMOGLOBIN A1C
Hgb A1c MFr Bld: 6.6 % of total Hgb — ABNORMAL HIGH (ref <5.7)
Mean Plasma Glucose: 143 (calc)
eAG (mmol/L): 7.9 (calc)

## 2019-10-05 LAB — LIPID PANEL
Cholesterol: 129 mg/dL
HDL: 49 mg/dL
LDL Cholesterol (Calc): 64 mg/dL
Non-HDL Cholesterol (Calc): 80 mg/dL
Total CHOL/HDL Ratio: 2.6 (calc)
Triglycerides: 75 mg/dL

## 2019-10-05 LAB — VITAMIN D 25 HYDROXY (VIT D DEFICIENCY, FRACTURES): Vit D, 25-Hydroxy: 36 ng/mL (ref 30–100)

## 2019-10-08 ENCOUNTER — Encounter: Payer: Self-pay | Admitting: Internal Medicine

## 2019-10-08 DIAGNOSIS — R21 Rash and other nonspecific skin eruption: Secondary | ICD-10-CM | POA: Insufficient documentation

## 2019-10-08 NOTE — Assessment & Plan Note (Signed)
See above

## 2019-10-08 NOTE — Assessment & Plan Note (Signed)
stable overall by history and exam, recent data reviewed with pt, and pt to continue medical treatment as before,  to f/u any worsening symptoms or concerns

## 2019-10-08 NOTE — Assessment & Plan Note (Signed)
Ok for [employer],  to f/u any worsening symptoms or concerns

## 2019-10-08 NOTE — Assessment & Plan Note (Signed)
stable overall by history and exam, recent data reviewed with pt, and pt to continue medical treatment as before,  to f/u any worsening symptoms or concerns  

## 2019-10-08 NOTE — Assessment & Plan Note (Signed)
For oral replacement

## 2019-10-08 NOTE — Assessment & Plan Note (Addendum)
Likely worsening, for pfTs and refer pulmonary  I spent 31 minutes in preparing to see the patient by review of recent labs, imaging and procedures, obtaining and reviewing separately obtained history, communicating with the patient and family or caregiver, ordering medications, tests or procedures, and documenting clinical information in the EHR including the differential Dx, treatment, and any further evaluation and other management of copd, vit d def, hld, htn, dyspnea, dm, ckd

## 2019-10-08 NOTE — Assessment & Plan Note (Signed)
Cont oral replacement 

## 2019-10-20 ENCOUNTER — Ambulatory Visit (INDEPENDENT_AMBULATORY_CARE_PROVIDER_SITE_OTHER): Payer: Medicare Other | Admitting: Podiatry

## 2019-10-20 ENCOUNTER — Other Ambulatory Visit: Payer: Self-pay

## 2019-10-20 ENCOUNTER — Encounter: Payer: Self-pay | Admitting: Podiatry

## 2019-10-20 DIAGNOSIS — L84 Corns and callosities: Secondary | ICD-10-CM

## 2019-10-20 DIAGNOSIS — M79674 Pain in right toe(s): Secondary | ICD-10-CM

## 2019-10-20 DIAGNOSIS — M79675 Pain in left toe(s): Secondary | ICD-10-CM

## 2019-10-20 DIAGNOSIS — B351 Tinea unguium: Secondary | ICD-10-CM

## 2019-10-20 DIAGNOSIS — M2042 Other hammer toe(s) (acquired), left foot: Secondary | ICD-10-CM

## 2019-10-20 DIAGNOSIS — E1142 Type 2 diabetes mellitus with diabetic polyneuropathy: Secondary | ICD-10-CM | POA: Diagnosis not present

## 2019-10-20 DIAGNOSIS — M2041 Other hammer toe(s) (acquired), right foot: Secondary | ICD-10-CM

## 2019-10-21 NOTE — Progress Notes (Signed)
Subjective: Nicholas Patton presents today at risk foot care with history of diabetic neuropathy and callus(es) b/l feet and painful mycotic toenails b/l that are difficult to trim. Pain interferes with ambulation. Aggravating factors include wearing enclosed shoe gear. Pain is relieved with periodic professional debridement.   He voices no new pedal concerns on today's visit.  Nicholas Borg, MD is patient's PCP. Last visit was:10/04/2019.  Past Medical History:  Diagnosis Date  . Anemia   . Arthritis   . Cataract    REMOVED BILATERAL  . Colon polyps   . DIABETES MELLITUS, TYPE II    takes Metformin and Glimepiride daily  . Diverticulosis   . GERD (gastroesophageal reflux disease)    takes Protonix daily  . History of colon polyps   . HYPERLIPIDEMIA    takes Atorvastatin daily  . HYPERTENSION    takes Amlodipine and Hyzaar daily  . Ileus following gastrointestinal surgery ([place]) 07/22/2012  . Joint pain   . Peripheral neuropathy   . Peripheral neuropathy   . PERIPHERAL NEUROPATHY, FEET 10/06/2007  . Renal disorder   . VITAMIN B12 DEFICIENCY 09/06/2008  . VITAMIN D DEFICIENCY 01/16/2010   takes Vitamin D daily     Current Outpatient Medications on File Prior to Visit  Medication Sig Dispense Refill  . aspirin 81 MG EC tablet Take 81 mg by mouth daily.      Marland Kitchen atorvastatin (LIPITOR) 40 MG tablet TAKE 1 TABLET BY MOUTH  DAILY (Patient taking differently: Take 40 mg by mouth daily at 6 PM. ) 90 tablet 3  . carvedilol (COREG) 12.5 MG tablet Take 1 tablet (12.5 mg total) by mouth 2 (two) times daily with a meal. 180 tablet 3  . Cholecalciferol (VITAMIN D3) 1000 UNITS CAPS Take 1 capsule (1,000 Units total) by mouth daily. 90 capsule 3  . glimepiride (AMARYL) 2 MG tablet TAKE 2 TABLET BY MOUTH  DAILY BEFORE BREAKFAST (Patient taking differently: Take 4 mg by mouth daily with breakfast. ) 180 tablet 3  . glucose blood (ONE TOUCH ULTRA TEST) test strip Use as instructed E11.9 200 each 12   . ketoconazole (NIZORAL) 2 % cream Apply 1 application topically daily. 15 g 0  . lidocaine (LIDODERM) 5 % Place 1 patch onto the skin daily. Remove & Discard patch within 12 hours or as directed by MD 30 patch 1  . losartan (COZAAR) 100 MG tablet TAKE 1 TABLET BY MOUTH  DAILY 90 tablet 2  . Multiple Vitamins-Minerals (MULTIVITAMIN PO) Take 1 tablet by mouth daily.    . NONFORMULARY OR COMPOUNDED ITEM Peripheral Neuropathy Cream: Bupivacaine 1%, Doxepin 3%, Gabapentin 6%, Pentoxifylline 3%, Topiramate 1% Order faxed to [facility] 1 each 3  . pantoprazole (PROTONIX) 40 MG tablet TAKE 1 TABLET BY MOUTH  DAILY (Patient taking differently: Take 40 mg by mouth daily. ) 90 tablet 3  . tamsulosin (FLOMAX) 0.4 MG CAPS capsule TAKE 1 CAPSULE BY MOUTH  DAILY (Patient taking differently: Take 0.4 mg by mouth daily. ) 90 capsule 3  . tiotropium (SPIRIVA HANDIHALER) 18 MCG inhalation capsule Place 1 capsule (18 mcg total) into inhaler and inhale daily. 30 capsule 12   No current facility-administered medications on file prior to visit.     Allergies  Allergen Reactions  . Amlodipine Swelling  . Cymbalta [Duloxetine Hcl]   . Oxycodone     Objective: Nicholas Patton is a pleasant 85 y.o. y.o. Patient Race: Black or African American [2]  male in NAD.  AAO x 3.  There were no vitals filed for this visit.  Vascular Examination: Capillary refill time to digits immediate b/l. Palpable DP pulses b/l. Palpable PT pulses b/l. Pedal hair absent b/l Skin temperature gradient within normal limits b/l. No edema noted b/l.  Dermatological Examination: Pedal skin with normal turgor, texture and tone bilaterally. No open wounds bilaterally. No interdigital macerations bilaterally. Toenails 1-5 b/l elongated, dystrophic, thickened, crumbly with subungual debris and tenderness to dorsal palpation. Hyperkeratotic lesion(s) L hallux, L 2nd toe, submet head 1 left foot and submet head 1 right foot.  No  erythema, no edema, no drainage, no flocculence.  Musculoskeletal: Normal muscle strength 5/5 to all lower extremity muscle groups bilaterally. No pain crepitus or joint limitation noted with ROM b/l. Hallux valgus with bunion deformity noted b/l. Hammertoes noted to the 2-5 bilaterally.  Neurological Examination: Protective sensation intact 5/5 intact bilaterally with 10g monofilament b/l. Patient has subjective symptoms of neuropathy. Babinski reflex negative b/l. Clonus negative b/l.  Assessment: 1. Pain due to onychomycosis of toenails of both feet   2. Corns and callosities   3. Acquired hammertoes of both feet   4. Diabetic peripheral neuropathy associated with type 2 diabetes mellitus ([place])    Plan: -Examined patient. -No new findings. No new orders. -Continue diabetic foot care principles. Literature dispensed on today.  -Toenails 1-5 b/l were debrided in length and girth with sterile nail nippers and dremel without iatrogenic bleeding.  -Corn(s) L hallux and L 2nd toe and callus(es) submet head 1 left foot and submet head 1 right foot were pared utilizing sterile scalpel blade without incident. Total number debrided =4. -Patient to continue soft, supportive shoe gear daily. -Patient to report any pedal injuries to medical professional immediately. -Dispensed Silipos toe caps for left hallux and second digit for interdigital corns. Apply every morning. Remove every evening. -Patient/POA to call should there be question/concern in the interim.  Return in about 3 months (around 01/20/2020) for diabetic nail and callus trim.  Nicholas Patton, DPM

## 2019-10-25 ENCOUNTER — Other Ambulatory Visit: Payer: Self-pay

## 2019-10-25 ENCOUNTER — Inpatient Hospital Stay: Payer: Medicare Other | Attending: Hematology and Oncology

## 2019-10-25 DIAGNOSIS — D472 Monoclonal gammopathy: Secondary | ICD-10-CM | POA: Diagnosis not present

## 2019-10-25 LAB — CMP (CANCER CENTER ONLY)
ALT: 18 U/L (ref 0–44)
AST: 21 U/L (ref 15–41)
Albumin: 3.9 g/dL (ref 3.5–5.0)
Alkaline Phosphatase: 122 U/L (ref 38–126)
Anion gap: 7 (ref 5–15)
BUN: 26 mg/dL — ABNORMAL HIGH (ref 8–23)
CO2: 25 mmol/L (ref 22–32)
Calcium: 10.2 mg/dL (ref 8.9–10.3)
Chloride: 107 mmol/L (ref 98–111)
Creatinine: 1.76 mg/dL — ABNORMAL HIGH (ref 0.61–1.24)
GFR, Est AFR Am: 41 mL/min — ABNORMAL LOW (ref >60)
GFR, Estimated: 35 mL/min — ABNORMAL LOW (ref >60)
Glucose, Bld: 216 mg/dL — ABNORMAL HIGH (ref 70–99)
Potassium: 4 mmol/L (ref 3.5–5.1)
Sodium: 139 mmol/L (ref 135–145)
Total Bilirubin: 0.6 mg/dL (ref 0.3–1.2)
Total Protein: 7 g/dL (ref 6.5–8.1)

## 2019-10-25 LAB — CBC WITH DIFFERENTIAL (CANCER CENTER ONLY)
Abs Immature Granulocytes: 0.01 10*3/uL (ref 0.00–0.07)
Basophils Absolute: 0 10*3/uL (ref 0.0–0.1)
Basophils Relative: 1 %
Eosinophils Absolute: 0.2 10*3/uL (ref 0.0–0.5)
Eosinophils Relative: 4 %
HCT: 44 % (ref 39.0–52.0)
Hemoglobin: 14.4 g/dL (ref 13.0–17.0)
Immature Granulocytes: 0 %
Lymphocytes Relative: 24 %
Lymphs Abs: 0.9 10*3/uL (ref 0.7–4.0)
MCH: 29.8 pg (ref 26.0–34.0)
MCHC: 32.7 g/dL (ref 30.0–36.0)
MCV: 91.1 fL (ref 80.0–100.0)
Monocytes Absolute: 0.4 10*3/uL (ref 0.1–1.0)
Monocytes Relative: 10 %
Neutro Abs: 2.4 10*3/uL (ref 1.7–7.7)
Neutrophils Relative %: 61 %
Platelet Count: 151 10*3/uL (ref 150–400)
RBC: 4.83 MIL/uL (ref 4.22–5.81)
RDW: 13.9 % (ref 11.5–15.5)
WBC Count: 3.9 10*3/uL — ABNORMAL LOW (ref 4.0–10.5)
nRBC: 0 % (ref 0.0–0.2)

## 2019-10-26 LAB — KAPPA/LAMBDA LIGHT CHAINS
Kappa free light chain: 1749 mg/L — ABNORMAL HIGH (ref 3.3–19.4)
Kappa, lambda light chain ratio: 101.1 — ABNORMAL HIGH (ref 0.26–1.65)
Lambda free light chains: 17.3 mg/L (ref 5.7–26.3)

## 2019-10-26 LAB — BETA 2 MICROGLOBULIN, SERUM: Beta-2 Microglobulin: 3 mg/L — ABNORMAL HIGH (ref 0.6–2.4)

## 2019-10-27 ENCOUNTER — Other Ambulatory Visit: Payer: Self-pay

## 2019-10-27 ENCOUNTER — Ambulatory Visit (INDEPENDENT_AMBULATORY_CARE_PROVIDER_SITE_OTHER): Payer: Medicare Other | Admitting: Pulmonary Disease

## 2019-10-27 DIAGNOSIS — J439 Emphysema, unspecified: Secondary | ICD-10-CM | POA: Diagnosis not present

## 2019-10-27 DIAGNOSIS — R0600 Dyspnea, unspecified: Secondary | ICD-10-CM

## 2019-10-27 LAB — PULMONARY FUNCTION TEST
DL/VA % pred: 50 %
DL/VA: 1.91 ml/min/mmHg/L
DLCO cor % pred: 49 %
DLCO cor: 12.94 ml/min/mmHg
DLCO unc % pred: 49 %
DLCO unc: 12.86 ml/min/mmHg
FEF 25-75 Post: 2.27 L/sec
FEF 25-75 Pre: 1.21 L/sec
FEF2575-%Change-Post: 86 %
FEF2575-%Pred-Post: 101 %
FEF2575-%Pred-Pre: 54 %
FEV1-%Change-Post: 16 %
FEV1-%Pred-Post: 85 %
FEV1-%Pred-Pre: 73 %
FEV1-Post: 2.55 L
FEV1-Pre: 2.18 L
FEV1FVC-%Change-Post: 2 %
FEV1FVC-%Pred-Pre: 91 %
FEV6-%Change-Post: 13 %
FEV6-%Pred-Post: 93 %
FEV6-%Pred-Pre: 82 %
FEV6-Post: 3.6 L
FEV6-Pre: 3.15 L
FEV6FVC-%Change-Post: 0 %
FEV6FVC-%Pred-Post: 103 %
FEV6FVC-%Pred-Pre: 104 %
FVC-%Change-Post: 14 %
FVC-%Pred-Post: 90 %
FVC-%Pred-Pre: 78 %
FVC-Post: 3.67 L
FVC-Pre: 3.21 L
Post FEV1/FVC ratio: 69 %
Post FEV6/FVC ratio: 98 %
Pre FEV1/FVC ratio: 68 %
Pre FEV6/FVC Ratio: 98 %
RV % pred: 120 %
RV: 3.47 L
TLC % pred: 95 %
TLC: 7.34 L

## 2019-10-27 NOTE — Progress Notes (Signed)
PFT done today.

## 2019-10-30 LAB — MULTIPLE MYELOMA PANEL, SERUM
Albumin SerPl Elph-Mcnc: 3.8 g/dL (ref 2.9–4.4)
Albumin/Glob SerPl: 1.6 (ref 0.7–1.7)
Alpha 1: 0.2 g/dL (ref 0.0–0.4)
Alpha2 Glob SerPl Elph-Mcnc: 0.7 g/dL (ref 0.4–1.0)
B-Globulin SerPl Elph-Mcnc: 0.9 g/dL (ref 0.7–1.3)
Gamma Glob SerPl Elph-Mcnc: 0.6 g/dL (ref 0.4–1.8)
Globulin, Total: 2.5 g/dL (ref 2.2–3.9)
IgA: 276 mg/dL (ref 61–437)
IgG (Immunoglobin G), Serum: 583 mg/dL — ABNORMAL LOW (ref 603–1613)
IgM (Immunoglobulin M), Srm: 29 mg/dL (ref 15–143)
M Protein SerPl Elph-Mcnc: 0.2 g/dL — ABNORMAL HIGH
Total Protein ELP: 6.3 g/dL (ref 6.0–8.5)

## 2019-10-31 NOTE — Progress Notes (Signed)
Patient Care Team: [name], MD as PCP - General  DIAGNOSIS:    ICD-10-CM   1. MGUS (monoclonal gammopathy of unknown significance)  D47.2     CHIEF COMPLIANT: Follow-up of MGUS  INTERVAL HISTORY: Nicholas Patton is a 85 y.o. with above-mentioned history of MGUS. Labs on 10/25/19 showed: WBC 3.9, Hg 14.4, creatinine 1.76, m-protein 0.2, kappa light chains 1,749, ratio 101.1, beta-2 microglobulin 3.0. He presents to the clinic today for annual follow-up.    ALLERGIES:  is allergic to amlodipine, cymbalta [duloxetine hcl], and oxycodone.  MEDICATIONS:  Current Outpatient Medications  Medication Sig Dispense Refill  . aspirin 81 MG EC tablet Take 81 mg by mouth daily.      Marland Kitchen atorvastatin (LIPITOR) 40 MG tablet TAKE 1 TABLET BY MOUTH  DAILY (Patient taking differently: Take 40 mg by mouth daily at 6 PM. ) 90 tablet 3  . carvedilol (COREG) 12.5 MG tablet Take 1 tablet (12.5 mg total) by mouth 2 (two) times daily with a meal. 180 tablet 3  . Cholecalciferol (VITAMIN D3) 1000 UNITS CAPS Take 1 capsule (1,000 Units total) by mouth daily. 90 capsule 3  . glimepiride (AMARYL) 2 MG tablet TAKE 2 TABLET BY MOUTH  DAILY BEFORE BREAKFAST (Patient taking differently: Take 4 mg by mouth daily with breakfast. ) 180 tablet 3  . glucose blood (ONE TOUCH ULTRA TEST) test strip Use as instructed E11.9 200 each 12  . ketoconazole (NIZORAL) 2 % cream Apply 1 application topically daily. 15 g 0  . lidocaine (LIDODERM) 5 % Place 1 patch onto the skin daily. Remove & Discard patch within 12 hours or as directed by MD 30 patch 1  . losartan (COZAAR) 100 MG tablet TAKE 1 TABLET BY MOUTH  DAILY 90 tablet 2  . Multiple Vitamins-Minerals (MULTIVITAMIN PO) Take 1 tablet by mouth daily.    . NONFORMULARY OR COMPOUNDED ITEM Peripheral Neuropathy Cream: Bupivacaine 1%, Doxepin 3%, Gabapentin 6%, Pentoxifylline 3%, Topiramate 1% Order faxed to [facility] 1 each 3  . pantoprazole (PROTONIX) 40 MG tablet  TAKE 1 TABLET BY MOUTH  DAILY (Patient taking differently: Take 40 mg by mouth daily. ) 90 tablet 3  . tamsulosin (FLOMAX) 0.4 MG CAPS capsule TAKE 1 CAPSULE BY MOUTH  DAILY (Patient taking differently: Take 0.4 mg by mouth daily. ) 90 capsule 3  . tiotropium (SPIRIVA HANDIHALER) 18 MCG inhalation capsule Place 1 capsule (18 mcg total) into inhaler and inhale daily. 30 capsule 12   No current facility-administered medications for this visit.    PHYSICAL EXAMINATION: ECOG PERFORMANCE STATUS: 1 - Symptomatic but completely ambulatory  There were no vitals filed for this visit. There were no vitals filed for this visit.   LABORATORY DATA:  I have reviewed the data as listed CMP Latest Ref Rng & Units 10/25/2019 10/04/2019 05/18/2019  Glucose 70 - 99 mg/dL 216(H) 141(H) 83  BUN 8 - 23 mg/dL 26(H) 25 29(H)  Creatinine 0.61 - 1.24 mg/dL 1.76(H) 1.68(H) 1.61(H)  Sodium 135 - 145 mmol/L 139 142 144  Potassium 3.5 - 5.1 mmol/L 4.0 4.4 4.6  Chloride 98 - 111 mmol/L 107 108 109  CO2 22 - 32 mmol/L _0 Calcium 8.9 - 10.3 mg/dL 10.2 9.6 9.7  Total Protein 6.5 - 8.1 g/dL 7.0 6.5 7.2  Total Bilirubin 0.3 - 1.2 mg/dL 0.6 0.6 0.9  Alkaline Phos 38 - 126 U/L 122 - 95  AST 15 - 41 U/L 21 22 26  ALT 0 - 44 U/L _0 Lab Results  Component Value Date   WBC 3.9 (L) 10/25/2019   HGB 14.4 10/25/2019   HCT 44.0 10/25/2019   MCV 91.1 10/25/2019   PLT 151 10/25/2019   NEUTROABS 2.4 10/25/2019    ASSESSMENT & PLAN:  MGUS (monoclonal gammopathy of unknown significance) Blood work review  09/20/2017: M protein 0.2 g(IgA kappa by immunofixation)Quantitative immunoglobulins: IgG 569, IgA 219, IgM 28 Bone survey 11/09/2017: Benign 10/25/2018: M protein 0.2 g (same as before) IgA kappa, Serum free kappa: 1409, KL ratio 136.8 (these labs were not done previously) 10/25/2019: M protein 0.2 g IgA kappa, kappa light chain: 1749, ratio 101, creatinine 1.76, beta-2 microglobulin: 3  I discussed with  him that the kappa light chain has increased but the ratio is still better. The creatinine clearance is only slightly abnormal. He is not anemic at this time.  His blood calcium level is also normal. Therefore we elected to watch and monitor again.  Return to clinic in 1 year with labs done 1 week ahead of time and follow-up.    No orders of the defined types were placed in this encounter.  The patient has a good understanding of the overall plan. he agrees with it. he will call with any problems that may develop before the next visit here.  Total time spent: 20 mins including face to face time and time spent for planning, charting and coordination of care  Nicholas Lose, MD 11/01/2019  I, Cloyde Reams Dorshimer, am acting as scribe for Dr. Nicholas Lose.  I have reviewed the above documentation for accuracy and completeness, and I agree with the above.    Faythe Ghee may be just verbal or

## 2019-11-01 ENCOUNTER — Inpatient Hospital Stay (HOSPITAL_BASED_OUTPATIENT_CLINIC_OR_DEPARTMENT_OTHER): Payer: Medicare Other | Admitting: Hematology and Oncology

## 2019-11-01 ENCOUNTER — Other Ambulatory Visit: Payer: Self-pay

## 2019-11-01 DIAGNOSIS — D472 Monoclonal gammopathy: Secondary | ICD-10-CM | POA: Diagnosis not present

## 2019-11-01 NOTE — Assessment & Plan Note (Signed)
Blood work review  09/20/2017: M protein 0.2 g(IgA kappa by immunofixation)Quantitative immunoglobulins: IgG 569, IgA 219, IgM 28 Bone survey 11/09/2017: Benign 10/25/2018: M protein 0.2 g (same as before) IgA kappa, Serum free kappa: 1409, KL ratio 136.8 (these labs were not done previously) 10/25/2019: M protein 0.2 g IgA kappa, kappa light chain: 1749, ratio 101, creatinine 1.76, beta-2 microglobulin: 3  I spoke to the patient's daughter and provided them with the copies of the labs. Return to clinic in 1 year with labs done 1 week ahead of time and follow-up.

## 2019-11-14 ENCOUNTER — Ambulatory Visit (INDEPENDENT_AMBULATORY_CARE_PROVIDER_SITE_OTHER): Payer: Medicare Other | Admitting: Pulmonary Disease

## 2019-11-14 ENCOUNTER — Encounter: Payer: Self-pay | Admitting: Pulmonary Disease

## 2019-11-14 ENCOUNTER — Other Ambulatory Visit: Payer: Self-pay

## 2019-11-14 VITALS — BP 140/72 | HR 61 | Temp 97.6°F | Ht 74.5 in | Wt 183.8 lb

## 2019-11-14 DIAGNOSIS — J4520 Mild intermittent asthma, uncomplicated: Secondary | ICD-10-CM | POA: Diagnosis not present

## 2019-11-14 DIAGNOSIS — I272 Pulmonary hypertension, unspecified: Secondary | ICD-10-CM | POA: Diagnosis not present

## 2019-11-14 DIAGNOSIS — R0600 Dyspnea, unspecified: Secondary | ICD-10-CM

## 2019-11-14 DIAGNOSIS — R0609 Other forms of dyspnea: Secondary | ICD-10-CM

## 2019-11-14 MED ORDER — BREO ELLIPTA 100-25 MCG/INH IN AEPB: 1.0000 | INHALATION_SPRAY | Freq: Every day | RESPIRATORY_TRACT | 11 refills | Status: DC

## 2019-11-14 NOTE — Patient Instructions (Signed)
Use Breo 1 puff once a day to treat asthma.  Continue to use albuterol every 6 hours AS NEEDED. Use it before you exercise.  I ordered a repeat heart ultrasound to check on the elevated blood pressure seen going into the lungs back in 2016. We will call you with the results.  Come back in 3 months for follow up with Dr. Silas Flood.

## 2019-11-15 DIAGNOSIS — N183 Chronic kidney disease, stage 3 unspecified: Secondary | ICD-10-CM | POA: Diagnosis not present

## 2019-11-16 ENCOUNTER — Other Ambulatory Visit: Payer: Self-pay | Admitting: Internal Medicine

## 2019-11-16 NOTE — Telephone Encounter (Signed)
Please refill as per office routine med refill policy (all routine meds refilled for 3 mo or monthly per pt preference up to one year from last visit, then month to month grace period for 3 mo, then further med refills will have to be denied)  

## 2019-11-16 NOTE — Progress Notes (Signed)
Patient ID: Nicholas Patton, male    DOB: 1934/03/18, 85 y.o.   MRN: [id number]  Chief Complaint  Patient presents with  . Consult    SOB and some coughing when he gets hot    Referring provider: [name], MD  HPI:   Nicholas Patton is a 85 y.o. man with reported "COPD" with recent spirometry without fixed obstruction but with significant bronchodilator response whom we are seeing at the request of [name], MD for evaluation of DOE.  Notes from referring provider reviewed.  Has had some mild dyspnea exertion going on some time now.  At least months to years.  He has been told he has COPD in the past but has never had spirometry.  Shortness of breath worse walking longer distances or on inclines.  Rest resolve symptoms.  No real shortness of breath at rest.  He has hayfever, seasonal allergies regularly.  Will use medicines to treat this when bad.  No recurrent pneumonias in the past no real bad bouts of bronchitis.  No asthma or exertional limitation as a child.  No syncope or presyncope.  Reportedly has occasionally received prednisone for some breathing difficulties in the past.  Is been at least 2 years or more since last use.  Spirometry recently demonstrates ratio at 68 to 69% but given his age this is over 90% predicted and within normal limits.  Significant bronchodilator response in both FEV1 and FVC.  CT scan 05/2019 personally reviewed and interpreted as diffuse significant upper lobe predominant emphysema without other significant abnormality.  PMH: Diabetes, hypertension Surgical history: Cataracts, neck surgery/fusion next knee replacement, cholecystectomy Family history: No history of lung cancer Social history: Former smoker, quit late 80s, lives in [place] / Pulmonary Flowsheets:   ACT:  No flowsheet data found.  MMRC: No flowsheet data found.  Epworth:  No flowsheet data found.  Tests:   FENO:  No results found for:  NITRICOXIDE  PFT: PFT Results Latest Ref Rng & Units 10/04/2019  FVC-Pre L 3.21  FVC-Predicted Pre % 78  FVC-Post L 3.67  FVC-Predicted Post % 90  Pre FEV1/FVC % % 68  Post FEV1/FCV % % 69  FEV1-Pre L 2.18  FEV1-Predicted Pre % 73  FEV1-Post L 2.55  DLCO uncorrected ml/min/mmHg 12.86  DLCO UNC% % 49  DLCO corrected ml/min/mmHg 12.94  DLCO COR %Predicted % 49  DLVA Predicted % 50  TLC L 7.34  TLC % Predicted % 95  RV % Predicted % 120    WALK:  No flowsheet data found.  Imaging: No results found.  Lab Results:  CBC    Component Value Date/Time   WBC 3.9 (L) 10/25/2019 1052   WBC 3.5 (L) 10/04/2019 1115   RBC 4.83 10/25/2019 1052   HGB 14.4 10/25/2019 1052   HCT 44.0 10/25/2019 1052   PLT 151 10/25/2019 1052   MCV 91.1 10/25/2019 1052   MCH 29.8 10/25/2019 1052   MCHC 32.7 10/25/2019 1052   RDW 13.9 10/25/2019 1052   LYMPHSABS 0.9 10/25/2019 1052   MONOABS 0.4 10/25/2019 1052   EOSABS 0.2 10/25/2019 1052   BASOSABS 0.0 10/25/2019 1052    BMET    Component Value Date/Time   NA 139 10/25/2019 1052   K 4.0 10/25/2019 1052   CL 107 10/25/2019 1052   CO2 25 10/25/2019 1052   GLUCOSE 216 (H) 10/25/2019 1052   BUN 26 (H) 10/25/2019 1052   CREATININE 1.76 (H) 10/25/2019 1052  CREATININE 1.68 (H) 10/04/2019 1115   CALCIUM 10.2 10/25/2019 1052   GFRNONAA 35 (L) 10/25/2019 1052   GFRNONAA 37 (L) 10/04/2019 1115   GFRAA 41 (L) 10/25/2019 1052   GFRAA 43 (L) 10/04/2019 1115    BNP    Component Value Date/Time   BNP 48.5 05/18/2019 1918    ProBNP No results found for: PROBNP  Specialty Problems      Pulmonary Problems   Allergic rhinitis    Qualifier: Diagnosis of  By: [name] MD, Hunt Oris       Acute upper respiratory infection   Dyspnea   Left-sided nosebleed   COPD (chronic obstructive pulmonary disease) (HCC)      Allergies  Allergen Reactions  . Amlodipine Swelling  . Cymbalta [Duloxetine Hcl]   . Oxycodone     Immunization History   Administered Date(s) Administered  . Fluad Quad(high Dose 65+) 11/09/2018  . Influenza Whole 11/21/2015  . Influenza, High Dose Seasonal PF 01/24/2013, 12/01/2016, 11/22/2017  . Influenza,inj,Quad PF,6+ Mos 12/08/2013  . Influenza-Unspecified 02/08/2015  . PFIZER SARS-COV-2 Vaccination 04/03/2019, 04/10/2019  . Pneumococcal Conjugate-13 03/22/2013  . Pneumococcal Polysaccharide-23 01/10/2016  . Td 10/05/2014    Past Medical History:  Diagnosis Date  . Anemia   . Arthritis   . Cataract    REMOVED BILATERAL  . Colon polyps   . DIABETES MELLITUS, TYPE II    takes Metformin and Glimepiride daily  . Diverticulosis   . GERD (gastroesophageal reflux disease)    takes Protonix daily  . History of colon polyps   . HYPERLIPIDEMIA    takes Atorvastatin daily  . HYPERTENSION    takes Amlodipine and Hyzaar daily  . Ileus following gastrointestinal surgery ([place]) 07/22/2012  . Joint pain   . Peripheral neuropathy   . Peripheral neuropathy   . PERIPHERAL NEUROPATHY, FEET 10/06/2007  . Renal disorder   . VITAMIN B12 DEFICIENCY 09/06/2008  . VITAMIN D DEFICIENCY 01/16/2010   takes Vitamin D daily    Tobacco History: Social History   Tobacco Use  Smoking Status Former Smoker  . Packs/day: 0.00  . Types: Cigarettes  Smokeless Tobacco Never Used  Tobacco Comment   quit smoking around 1990   Counseling given: Not Answered Comment: quit smoking around 1990   Continue to not smoke  Outpatient Encounter Medications as of 11/14/2019  Medication Sig  . aspirin 81 MG EC tablet Take 81 mg by mouth daily.    Marland Kitchen atorvastatin (LIPITOR) 40 MG tablet TAKE 1 TABLET BY MOUTH  DAILY (Patient taking differently: Take 40 mg by mouth daily at 6 PM. )  . carvedilol (COREG) 12.5 MG tablet Take 1 tablet (12.5 mg total) by mouth 2 (two) times daily with a meal.  . Cholecalciferol (VITAMIN D3) 1000 UNITS CAPS Take 1 capsule (1,000 Units total) by mouth daily.  Marland Kitchen glimepiride (AMARYL) 2 MG tablet TAKE 2  TABLET BY MOUTH  DAILY BEFORE BREAKFAST (Patient taking differently: Take 4 mg by mouth daily with breakfast. )  . glucose blood (ONE TOUCH ULTRA TEST) test strip Use as instructed E11.9  . ketoconazole (NIZORAL) 2 % cream Apply 1 application topically daily.  Marland Kitchen lidocaine (LIDODERM) 5 % Place 1 patch onto the skin daily. Remove & Discard patch within 12 hours or as directed by MD  . losartan (COZAAR) 100 MG tablet TAKE 1 TABLET BY MOUTH  DAILY  . Multiple Vitamins-Minerals (MULTIVITAMIN PO) Take 1 tablet by mouth daily.  . NONFORMULARY OR COMPOUNDED ITEM Peripheral Neuropathy  Cream: Bupivacaine 1%, Doxepin 3%, Gabapentin 6%, Pentoxifylline 3%, Topiramate 1% Order faxed to [facility]  . pantoprazole (PROTONIX) 40 MG tablet TAKE 1 TABLET BY MOUTH  DAILY (Patient taking differently: Take 40 mg by mouth daily. )  . tamsulosin (FLOMAX) 0.4 MG CAPS capsule TAKE 1 CAPSULE BY MOUTH  DAILY (Patient taking differently: Take 0.4 mg by mouth daily. )  . [DISCONTINUED] tiotropium (SPIRIVA HANDIHALER) 18 MCG inhalation capsule Place 1 capsule (18 mcg total) into inhaler and inhale daily.  . fluticasone furoate-vilanterol (BREO ELLIPTA) 100-25 MCG/INH AEPB Inhale 1 puff into the lungs daily.   No facility-administered encounter medications on file as of 11/14/2019.     Review of Systems  Review of Systems  No chest pain with exertion, no orthopnea or PND, denies any lower extremity swelling.  Comprehensive review of systems otherwise negative. Physical Exam  BP 140/72 (BP Location: Left Arm, Cuff Size: Normal)   Pulse 61   Temp 97.6 F (36.4 C) (Oral)   Ht 6' 2.5" (1.892 m)   Wt 183 lb 12.8 oz (83.4 kg)   SpO2 97%   BMI 23.28 kg/m   Wt Readings from Last 5 Encounters:  11/14/19 183 lb 12.8 oz (83.4 kg)  11/01/19 184 lb (83.5 kg)  10/04/19 182 lb (82.6 kg)  05/19/19 183 lb (83 kg)  05/18/19 183 lb (83 kg)    BMI Readings from Last 5 Encounters:  11/14/19 23.28 kg/m  11/01/19 23.62  kg/m  10/04/19 23.37 kg/m  05/19/19 23.50 kg/m  05/18/19 23.50 kg/m     Physical Exam General: Well-appearing, in no acute distress Eyes: EOMI, no icterus Neck: Supple, no JVP appreciated sitting upright Respiratory: Clear to auscultation bilaterally, normal work of breathing on room air Cardiovascular: Regular rate and rhythm, no murmurs Abdomen: Soft, bowel sounds present Extremities: Warm, no edema MSK: No synovitis, no joint effusions Neuro: Normal gait, no weakness Psych: Normal mood, full affect   Assessment & Plan:   Nicholas Patton is a 85 y.o. man with reported "COPD" with recent spirometry without fixed obstruction but with significant bronchodilator response whom we are seeing at the request of [name], MD for evaluation of DOE.  PFTs are suggestive of asthma and this is likely a driver of symptoms. Will initiate ICS/LABA therapy and assess response. Additionally, DLCO is severely reduced which is related to emphysema seen on CT but also concern for contribution of pulmonary HTN given elevated PA pressures seen on TTE in 2016. Unclear circumstances surrounding that study. Will repeat TTE and if pressures still elevated plan to do serologic work up for pulmonary HTN. Suspect component of group 3 disease but given he is on no oxygen would not expect severe pulmonary HTN from this alone.    Return in about 3 months (around 02/13/2020).   Lanier Clam, MD 11/16/2019

## 2019-11-24 DIAGNOSIS — N1831 Chronic kidney disease, stage 3a: Secondary | ICD-10-CM | POA: Diagnosis not present

## 2019-11-24 DIAGNOSIS — D631 Anemia in chronic kidney disease: Secondary | ICD-10-CM | POA: Diagnosis not present

## 2019-11-24 DIAGNOSIS — D472 Monoclonal gammopathy: Secondary | ICD-10-CM | POA: Diagnosis not present

## 2019-11-24 DIAGNOSIS — E559 Vitamin D deficiency, unspecified: Secondary | ICD-10-CM | POA: Diagnosis not present

## 2019-11-24 DIAGNOSIS — I129 Hypertensive chronic kidney disease with stage 1 through stage 4 chronic kidney disease, or unspecified chronic kidney disease: Secondary | ICD-10-CM | POA: Diagnosis not present

## 2019-11-24 DIAGNOSIS — R600 Localized edema: Secondary | ICD-10-CM | POA: Diagnosis not present

## 2019-11-30 ENCOUNTER — Other Ambulatory Visit: Payer: Self-pay

## 2019-11-30 ENCOUNTER — Ambulatory Visit (HOSPITAL_COMMUNITY): Payer: Medicare Other | Attending: Internal Medicine

## 2019-11-30 ENCOUNTER — Ambulatory Visit (INDEPENDENT_AMBULATORY_CARE_PROVIDER_SITE_OTHER): Payer: Medicare Other

## 2019-11-30 DIAGNOSIS — R0609 Other forms of dyspnea: Secondary | ICD-10-CM

## 2019-11-30 DIAGNOSIS — R0600 Dyspnea, unspecified: Secondary | ICD-10-CM | POA: Insufficient documentation

## 2019-11-30 DIAGNOSIS — I272 Pulmonary hypertension, unspecified: Secondary | ICD-10-CM | POA: Diagnosis not present

## 2019-11-30 DIAGNOSIS — Z23 Encounter for immunization: Secondary | ICD-10-CM

## 2019-11-30 LAB — ECHOCARDIOGRAM COMPLETE
Area-P 1/2: 1.7 cm2
S' Lateral: 2.4 cm

## 2019-11-30 NOTE — Progress Notes (Signed)
High Dose flu vaccine given w/o complications.

## 2019-12-05 ENCOUNTER — Encounter: Payer: Self-pay | Admitting: Internal Medicine

## 2019-12-06 DIAGNOSIS — R972 Elevated prostate specific antigen [PSA]: Secondary | ICD-10-CM | POA: Diagnosis not present

## 2019-12-18 DIAGNOSIS — R351 Nocturia: Secondary | ICD-10-CM | POA: Diagnosis not present

## 2019-12-18 DIAGNOSIS — N401 Enlarged prostate with lower urinary tract symptoms: Secondary | ICD-10-CM | POA: Diagnosis not present

## 2019-12-18 DIAGNOSIS — R972 Elevated prostate specific antigen [PSA]: Secondary | ICD-10-CM | POA: Diagnosis not present

## 2019-12-23 ENCOUNTER — Ambulatory Visit: Payer: Medicare Other | Attending: Internal Medicine

## 2019-12-23 DIAGNOSIS — Z23 Encounter for immunization: Secondary | ICD-10-CM

## 2019-12-23 NOTE — Progress Notes (Signed)
   Covid-19 Vaccination Clinic  Name:  Nicholas Patton    MRN: [id number] DOB: [birth year]  12/23/2019  [name] was observed post Covid-19 immunization for 15 minutes without incident. He was provided with Vaccine Information Sheet and instruction to access the V-Safe system.   Nicholas Patton was instructed to call 911 with any severe reactions post vaccine: Marland Kitchen Difficulty breathing  . Swelling of face and throat  . A fast heartbeat  . A bad rash all over body  . Dizziness and weakness

## 2020-01-26 ENCOUNTER — Encounter: Payer: Self-pay | Admitting: Podiatry

## 2020-01-26 ENCOUNTER — Ambulatory Visit (INDEPENDENT_AMBULATORY_CARE_PROVIDER_SITE_OTHER): Payer: Medicare Other | Admitting: Podiatry

## 2020-01-26 ENCOUNTER — Other Ambulatory Visit: Payer: Self-pay

## 2020-01-26 DIAGNOSIS — B351 Tinea unguium: Secondary | ICD-10-CM

## 2020-01-26 DIAGNOSIS — L84 Corns and callosities: Secondary | ICD-10-CM

## 2020-01-26 DIAGNOSIS — M79675 Pain in left toe(s): Secondary | ICD-10-CM | POA: Diagnosis not present

## 2020-01-26 DIAGNOSIS — M79674 Pain in right toe(s): Secondary | ICD-10-CM | POA: Diagnosis not present

## 2020-01-26 DIAGNOSIS — M2041 Other hammer toe(s) (acquired), right foot: Secondary | ICD-10-CM

## 2020-01-26 DIAGNOSIS — E1142 Type 2 diabetes mellitus with diabetic polyneuropathy: Secondary | ICD-10-CM | POA: Diagnosis not present

## 2020-01-26 DIAGNOSIS — M2042 Other hammer toe(s) (acquired), left foot: Secondary | ICD-10-CM

## 2020-01-28 NOTE — Progress Notes (Signed)
Subjective: Nicholas Patton presents today at risk foot care with history of diabetic neuropathy and callus(es) b/l feet and painful mycotic toenails b/l that are difficult to trim. Pain interferes with ambulation. Aggravating factors include wearing enclosed shoe gear. Pain is relieved with periodic professional debridement.   Nicholas Patton voices no new pedal concerns on today's visit. Nicholas Patton states blood glucose was 135 mg/dl this morning.  Nicholas Borg, MD is patient's PCP. Last visit was: 10/04/2019.  Past Medical History:  Diagnosis Date  . Anemia   . Arthritis   . Cataract    REMOVED BILATERAL  . Colon polyps   . DIABETES MELLITUS, TYPE II    takes Metformin and Glimepiride daily  . Diverticulosis   . GERD (gastroesophageal reflux disease)    takes Protonix daily  . History of colon polyps   . HYPERLIPIDEMIA    takes Atorvastatin daily  . HYPERTENSION    takes Amlodipine and Hyzaar daily  . Ileus following gastrointestinal surgery ([place]) 07/22/2012  . Joint pain   . Peripheral neuropathy   . Peripheral neuropathy   . PERIPHERAL NEUROPATHY, FEET 10/06/2007  . Renal disorder   . VITAMIN B12 DEFICIENCY 09/06/2008  . VITAMIN D DEFICIENCY 01/16/2010   takes Vitamin D daily     Current Outpatient Medications on File Prior to Visit  Medication Sig Dispense Refill  . aspirin 81 MG EC tablet Take 81 mg by mouth daily.      Nicholas Patton Kitchen atorvastatin (LIPITOR) 40 MG tablet TAKE 1 TABLET BY MOUTH  DAILY 90 tablet 3  . carvedilol (COREG) 12.5 MG tablet Take 1 tablet (12.5 mg total) by mouth 2 (two) times daily with a meal. 180 tablet 3  . Cholecalciferol (VITAMIN D3) 1000 UNITS CAPS Take 1 capsule (1,000 Units total) by mouth daily. 90 capsule 3  . fluticasone furoate-vilanterol (BREO ELLIPTA) 100-25 MCG/INH AEPB Inhale 1 puff into the lungs daily. 60 each 11  . glimepiride (AMARYL) 2 MG tablet TAKE 2 TABLET BY MOUTH  DAILY BEFORE BREAKFAST (Patient taking differently: Take 4 mg by mouth daily with breakfast. )  180 tablet 3  . glucose blood (ONE TOUCH ULTRA TEST) test strip Use as instructed E11.9 200 each 12  . ketoconazole (NIZORAL) 2 % cream Apply 1 application topically daily. 15 g 0  . lidocaine (LIDODERM) 5 % Place 1 patch onto the skin daily. Remove & Discard patch within 12 hours or as directed by MD 30 patch 1  . losartan (COZAAR) 100 MG tablet TAKE 1 TABLET BY MOUTH  DAILY 90 tablet 2  . Multiple Vitamins-Minerals (MULTIVITAMIN PO) Take 1 tablet by mouth daily.    . NONFORMULARY OR COMPOUNDED ITEM Peripheral Neuropathy Cream: Bupivacaine 1%, Doxepin 3%, Gabapentin 6%, Pentoxifylline 3%, Topiramate 1% Order faxed to [place] 1 each 3  . pantoprazole (PROTONIX) 40 MG tablet TAKE 1 TABLET BY MOUTH  DAILY 90 tablet 3  . tamsulosin (FLOMAX) 0.4 MG CAPS capsule TAKE 1 CAPSULE BY MOUTH  DAILY 90 capsule 3   No current facility-administered medications on file prior to visit.     Allergies  Allergen Reactions  . Amlodipine Swelling  . Cymbalta [Duloxetine Hcl]   . Oxycodone     Objective: Nicholas Patton is a pleasant 85 y.o. y.o. Patient Race: Black or African American [2]  male in NAD. AAO x 3.  There were no vitals filed for this visit.  Vascular Examination: Capillary refill time to digits immediate b/l. Palpable DP pulses b/l. Palpable  PT pulses b/l. Pedal hair absent b/l Skin temperature gradient within normal limits b/l. No edema noted b/l.  Dermatological Examination: Pedal skin with normal turgor, texture and tone bilaterally. No open wounds bilaterally. No interdigital macerations bilaterally. Toenails 1-5 b/l elongated, dystrophic, thickened, crumbly with subungual debris and tenderness to dorsal palpation. Hyperkeratotic lesion(s) L hallux, L 2nd toe, submet head 1 left foot and submet head 1 right foot.  No erythema, no edema, no drainage, no flocculence.  Musculoskeletal: Normal muscle strength 5/5 to all lower extremity muscle groups bilaterally. No pain  crepitus or joint limitation noted with ROM b/l. Hallux valgus with bunion deformity noted b/l. Hammertoes noted to the 2-5 bilaterally.  Neurological Examination: Protective sensation intact 5/5 intact bilaterally with 10g monofilament b/l. Patient has subjective symptoms of neuropathy. Babinski reflex negative b/l. Clonus negative b/l.  Assessment: 1. Pain due to onychomycosis of toenails of both feet   2. Corns and callosities   3. Acquired hammertoes of both feet   4. Diabetic peripheral neuropathy associated with type 2 diabetes mellitus ([place])    Plan: -Examined patient. -No new findings. No new orders. -Continue diabetic foot care principles. Literature dispensed on today.  -Toenails 1-5 b/l were debrided in length and girth with sterile nail nippers and dremel without iatrogenic bleeding.  -Corn(s) L hallux and L 2nd toe and callus(es) submet head 1 left foot and submet head 1 right foot were pared utilizing sterile scalpel blade without incident. Total number debrided =4. -Patient to continue soft, supportive shoe gear daily. -Patient to report any pedal injuries to medical professional immediately. -Dispensed Silipos toe caps for left hallux and second digit for interdigital corns. Apply every morning. Remove every evening. -Patient/POA to call should there be question/concern in the interim.  Return in about 3 months (around 04/27/2020) for diabetic foot care.  Marzetta Board, DPM

## 2020-01-29 ENCOUNTER — Other Ambulatory Visit: Payer: Self-pay

## 2020-01-29 ENCOUNTER — Ambulatory Visit (INDEPENDENT_AMBULATORY_CARE_PROVIDER_SITE_OTHER): Payer: Medicare Other | Admitting: Internal Medicine

## 2020-01-29 ENCOUNTER — Encounter: Payer: Self-pay | Admitting: Internal Medicine

## 2020-01-29 VITALS — BP 180/90 | HR 54 | Temp 97.8°F | Ht 74.5 in | Wt 187.0 lb

## 2020-01-29 DIAGNOSIS — M25571 Pain in right ankle and joints of right foot: Secondary | ICD-10-CM | POA: Diagnosis not present

## 2020-01-29 DIAGNOSIS — G8929 Other chronic pain: Secondary | ICD-10-CM | POA: Diagnosis not present

## 2020-01-29 DIAGNOSIS — J439 Emphysema, unspecified: Secondary | ICD-10-CM | POA: Diagnosis not present

## 2020-01-29 DIAGNOSIS — E1140 Type 2 diabetes mellitus with diabetic neuropathy, unspecified: Secondary | ICD-10-CM

## 2020-01-29 DIAGNOSIS — I10 Essential (primary) hypertension: Secondary | ICD-10-CM

## 2020-01-29 DIAGNOSIS — L299 Pruritus, unspecified: Secondary | ICD-10-CM | POA: Diagnosis not present

## 2020-01-29 MED ORDER — PERMETHRIN 5 % EX CREA: 1.0000 "application " | TOPICAL_CREAM | Freq: Once | CUTANEOUS | 0 refills | Status: AC

## 2020-01-29 NOTE — Progress Notes (Signed)
Subjective:    Patient ID: Nicholas Patton, male    DOB: 11-Dec-1934, 85 y.o.   MRN: [id number]  HPI Here to f/u; overall doing ok,  Pt denies chest pain, increasing sob or doe, wheezing, orthopnea, PND, increased LE swelling, palpitations, dizziness or syncope.  Pt denies new neurological symptoms such as new headache, or facial or extremity weakness or numbness.  Pt denies polydipsia, polyuria, or low sugar episode.  Pt states overall good compliance with meds, mostly trying to follow appropriate diet, with wt overall stable,  but little exercise however.  Also has intermittent mild pain swelling of right ankle with walking over the past few wks without being unstable or falls or fever.  BP < 140/90 at home per pt.  Does also c/o marked itchiniess to groin area and pubic hair, convinced he has lice with knits Past Medical History:  Diagnosis Date  . Anemia   . Arthritis   . Cataract    REMOVED BILATERAL  . Colon polyps   . DIABETES MELLITUS, TYPE II    takes Metformin and Glimepiride daily  . Diverticulosis   . GERD (gastroesophageal reflux disease)    takes Protonix daily  . History of colon polyps   . HYPERLIPIDEMIA    takes Atorvastatin daily  . HYPERTENSION    takes Amlodipine and Hyzaar daily  . Ileus following gastrointestinal surgery ([place]) 07/22/2012  . Joint pain   . Peripheral neuropathy   . Peripheral neuropathy   . PERIPHERAL NEUROPATHY, FEET 10/06/2007  . Renal disorder   . VITAMIN B12 DEFICIENCY 09/06/2008  . VITAMIN D DEFICIENCY 01/16/2010   takes Vitamin D daily   Past Surgical History:  Procedure Laterality Date  . CATARACT EXTRACTION     both eyes  . CHOLECYSTECTOMY N/A 07/18/2012   Procedure: LAPAROSCOPIC CHOLECYSTECTOMY WITH INTRAOPERATIVE CHOLANGIOGRAM;  Surgeon: Zenovia Jarred, MD;  Location: [facility];  Service: General;  Laterality: N/A;  . COLONOSCOPY    . POLYPECTOMY    . POSTERIOR CERVICAL FUSION/FORAMINOTOMY N/A 06/27/2015   Procedure: Posterior Cervical  Fusion with lateral mass fixation Cervical four to cervical seven;  Surgeon: Eustace Moore, MD;  Location: [facility];  Service: Neurosurgery;  Laterality: N/A;  . TOTAL KNEE ARTHROPLASTY  2001   Left  . TURP VAPORIZATION      reports that he has quit smoking. His smoking use included cigarettes. He smoked 0.00 packs per day. He has never used smokeless tobacco. He reports that he does not drink alcohol and does not use drugs. family history includes Diabetes Mellitus II in his mother; Lung cancer in his brother. Allergies  Allergen Reactions  . Amlodipine Swelling  . Cymbalta [Duloxetine Hcl]   . Oxycodone    Current Outpatient Medications on File Prior to Visit  Medication Sig Dispense Refill  . aspirin 81 MG EC tablet Take 81 mg by mouth daily.      Marland Kitchen atorvastatin (LIPITOR) 40 MG tablet TAKE 1 TABLET BY MOUTH  DAILY 90 tablet 3  . carvedilol (COREG) 12.5 MG tablet Take 1 tablet (12.5 mg total) by mouth 2 (two) times daily with a meal. 180 tablet 3  . Cholecalciferol (VITAMIN D3) 1000 UNITS CAPS Take 1 capsule (1,000 Units total) by mouth daily. 90 capsule 3  . fluticasone furoate-vilanterol (BREO ELLIPTA) 100-25 MCG/INH AEPB Inhale 1 puff into the lungs daily. 60 each 11  . glimepiride (AMARYL) 2 MG tablet TAKE 2 TABLET BY MOUTH  DAILY BEFORE BREAKFAST (Patient taking  differently: Take 4 mg by mouth daily with breakfast. ) 180 tablet 3  . glucose blood (ONE TOUCH ULTRA TEST) test strip Use as instructed E11.9 200 each 12  . losartan (COZAAR) 100 MG tablet TAKE 1 TABLET BY MOUTH  DAILY 90 tablet 2  . Multiple Vitamins-Minerals (MULTIVITAMIN PO) Take 1 tablet by mouth daily.    . pantoprazole (PROTONIX) 40 MG tablet TAKE 1 TABLET BY MOUTH  DAILY 90 tablet 3  . tamsulosin (FLOMAX) 0.4 MG CAPS capsule TAKE 1 CAPSULE BY MOUTH  DAILY 90 capsule 3  . ketoconazole (NIZORAL) 2 % cream Apply 1 application topically daily. (Patient not taking: Reported on 01/29/2020) 15 g 0  . lidocaine  (LIDODERM) 5 % Place 1 patch onto the skin daily. Remove & Discard patch within 12 hours or as directed by MD (Patient not taking: Reported on 01/29/2020) 30 patch 1  . NONFORMULARY OR COMPOUNDED ITEM Peripheral Neuropathy Cream: Bupivacaine 1%, Doxepin 3%, Gabapentin 6%, Pentoxifylline 3%, Topiramate 1% Order faxed to [place] (Patient not taking: Reported on 01/29/2020) 1 each 3   No current facility-administered medications on file prior to visit.   Review of Systems     Objective:   Physical Exam        Assessment & Plan:

## 2020-01-29 NOTE — Patient Instructions (Signed)
Please take all new medication as prescribed - the stronger permethrin prescription  Please continue all other medications as before, and refills have been done if requested.  Please have the pharmacy call with any other refills you may need.  Please continue your efforts at being more active, low cholesterol diet, and weight control.  Please keep your appointments with your specialists as you may have planned

## 2020-02-04 ENCOUNTER — Encounter: Payer: Self-pay | Admitting: Internal Medicine

## 2020-02-04 DIAGNOSIS — L299 Pruritus, unspecified: Secondary | ICD-10-CM | POA: Insufficient documentation

## 2020-02-04 DIAGNOSIS — M25571 Pain in right ankle and joints of right foot: Secondary | ICD-10-CM | POA: Insufficient documentation

## 2020-02-04 NOTE — Assessment & Plan Note (Signed)
stable overall by history and exam, recent data reviewed with pt, and pt to continue medical treatment as before,  to f/u any worsening symptoms or concerns  

## 2020-02-04 NOTE — Assessment & Plan Note (Signed)
stable overall by history and exam, recent data reviewed with pt, and pt to continue medical treatment as before,  to f/u any worsening symptoms or concerns

## 2020-02-04 NOTE — Assessment & Plan Note (Addendum)
Groin area significant, ? Lice - for permethrin asd  I spent 31 minutes in preparing to see the patient by review of recent labs, imaging and procedures, obtaining and reviewing separately obtained history, communicating with the patient and family or caregiver, ordering medications, tests or procedures, and documenting clinical information in the EHR including the differential Dx, treatment, and any further evaluation and other management of pruritis, right ankle pain, dm, htn, copd

## 2020-02-04 NOTE — Assessment & Plan Note (Signed)
C/w djd, for tylenol prn,  to f/u any worsening symptoms or concerns

## 2020-02-15 ENCOUNTER — Ambulatory Visit (INDEPENDENT_AMBULATORY_CARE_PROVIDER_SITE_OTHER): Payer: Medicare Other | Admitting: Pulmonary Disease

## 2020-02-15 ENCOUNTER — Other Ambulatory Visit: Payer: Self-pay

## 2020-02-15 ENCOUNTER — Encounter: Payer: Self-pay | Admitting: Pulmonary Disease

## 2020-02-15 VITALS — BP 136/76 | HR 62 | Temp 97.4°F | Ht 75.0 in | Wt 186.0 lb

## 2020-02-15 DIAGNOSIS — L309 Dermatitis, unspecified: Secondary | ICD-10-CM

## 2020-02-15 DIAGNOSIS — R0600 Dyspnea, unspecified: Secondary | ICD-10-CM | POA: Diagnosis not present

## 2020-02-15 DIAGNOSIS — J4520 Mild intermittent asthma, uncomplicated: Secondary | ICD-10-CM

## 2020-02-15 DIAGNOSIS — R0609 Other forms of dyspnea: Secondary | ICD-10-CM

## 2020-02-15 MED ORDER — BREO ELLIPTA 200-25 MCG/INH IN AEPB: 1.0000 | INHALATION_SPRAY | Freq: Every day | RESPIRATORY_TRACT | 0 refills | Status: DC

## 2020-02-15 NOTE — Patient Instructions (Addendum)
Nice to see you again  I am glad the Memory Dance has helped some!  We are going to increase the dose to the high dose - use the samples we provided after you finish the current inhaler you already have  Please fill out paperwork - we will send to drug company and see if they will help pay for the medicine.  Come back in 3 months for follow up with Dr. Silas Flood.

## 2020-02-15 NOTE — Progress Notes (Signed)
Patient ID: Nicholas Patton, male    DOB: [birth year], 85 y.o.   MRN: [id number]  Chief Complaint  Patient presents with  . Shortness of Breath    Pt c/o stable sob with exertion, sometimes prod cough with yellow/green mucus.     Referring provider: [name], MD  HPI:   Nicholas Patton is a 85 y.o. man with reported "COPD" with recent spirometry without fixed obstruction but with significant bronchodilator response whom we are seeing in follow up for asthma, DOE.  Reports mild improvement in symptoms on Breo. Still DOE with heavy objects or carrying things. Reports improved frequency of albuterol use since starting breo. Used a handul of times in 3 months. No cough. Discussed POC with patient and daughter who was on phone.   HPI at initial visit: Has had some mild dyspnea exertion going on some time now.  At least months to years.  He has been told he has COPD in the past but has never had spirometry.  Shortness of breath worse walking longer distances or on inclines.  Rest resolve symptoms.  No real shortness of breath at rest.  He has hayfever, seasonal allergies regularly.  Will use medicines to treat this when bad.  No recurrent pneumonias in the past no real bad bouts of bronchitis.  No asthma or exertional limitation as a child.  No syncope or presyncope.  Reportedly has occasionally received prednisone for some breathing difficulties in the past.  Is been at least 2 years or more since last use.  Spirometry recently demonstrates ratio at 68 to 69% but given his age this is over 90% predicted and within normal limits.  Significant bronchodilator response in both FEV1 and FVC.  CT scan 05/2019 personally reviewed and interpreted as diffuse significant upper lobe predominant emphysema without other significant abnormality.  PMH: Diabetes, hypertension Surgical history: Cataracts, neck surgery/fusion next knee replacement, cholecystectomy Family history: No history of lung  cancer Social history: Former smoker, quit late 80s, lives in [place] / Pulmonary Flowsheets:   ACT:  No flowsheet data found.  MMRC: No flowsheet data found.  Epworth:  No flowsheet data found.  Tests:   FENO:  No results found for: NITRICOXIDE  PFT: PFT Results Latest Ref Rng & Units 10/04/2019  FVC-Pre L 3.21  FVC-Predicted Pre % 78  FVC-Post L 3.67  FVC-Predicted Post % 90  Pre FEV1/FVC % % 68  Post FEV1/FCV % % 69  FEV1-Pre L 2.18  FEV1-Predicted Pre % 73  FEV1-Post L 2.55  DLCO uncorrected ml/min/mmHg 12.86  DLCO UNC% % 49  DLCO corrected ml/min/mmHg 12.94  DLCO COR %Predicted % 49  DLVA Predicted % 50  TLC L 7.34  TLC % Predicted % 95  RV % Predicted % 120    WALK:  No flowsheet data found.  Imaging: No results found.  Lab Results:  CBC    Component Value Date/Time   WBC 3.9 (L) 10/25/2019 1052   WBC 3.5 (L) 10/04/2019 1115   RBC 4.83 10/25/2019 1052   HGB 14.4 10/25/2019 1052   HCT 44.0 10/25/2019 1052   PLT 151 10/25/2019 1052   MCV 91.1 10/25/2019 1052   MCH 29.8 10/25/2019 1052   MCHC 32.7 10/25/2019 1052   RDW 13.9 10/25/2019 1052   LYMPHSABS 0.9 10/25/2019 1052   MONOABS 0.4 10/25/2019 1052   EOSABS 0.2 10/25/2019 1052   BASOSABS 0.0 10/25/2019 1052    BMET    Component Value Date/Time  NA 139 10/25/2019 1052   K 4.0 10/25/2019 1052   CL 107 10/25/2019 1052   CO2 25 10/25/2019 1052   GLUCOSE 216 (H) 10/25/2019 1052   BUN 26 (H) 10/25/2019 1052   CREATININE 1.76 (H) 10/25/2019 1052   CREATININE 1.68 (H) 10/04/2019 1115   CALCIUM 10.2 10/25/2019 1052   GFRNONAA 35 (L) 10/25/2019 1052   GFRNONAA 37 (L) 10/04/2019 1115   GFRAA 41 (L) 10/25/2019 1052   GFRAA 43 (L) 10/04/2019 1115    BNP    Component Value Date/Time   BNP 48.5 05/18/2019 1918    ProBNP No results found for: PROBNP  Specialty Problems      Pulmonary Problems   Allergic rhinitis    Qualifier: Diagnosis of  By: [name] MD, Hunt Oris       Acute upper respiratory infection   Dyspnea   Left-sided nosebleed   COPD (chronic obstructive pulmonary disease) (HCC)      Allergies  Allergen Reactions  . Amlodipine Swelling  . Cymbalta [Duloxetine Hcl]   . Oxycodone     Immunization History  Administered Date(s) Administered  . Fluad Quad(high Dose 65+) 11/09/2018, 11/30/2019  . Influenza Whole 11/21/2015  . Influenza, High Dose Seasonal PF 01/24/2013, 12/01/2016, 11/22/2017  . Influenza,inj,Quad PF,6+ Mos 12/08/2013  . Influenza-Unspecified 02/08/2015  . PFIZER SARS-COV-2 Vaccination 04/03/2019, 04/10/2019, 12/23/2019  . Pneumococcal Conjugate-13 03/22/2013  . Pneumococcal Polysaccharide-23 01/10/2016  . Td 10/05/2014    Past Medical History:  Diagnosis Date  . Anemia   . Arthritis   . Cataract    REMOVED BILATERAL  . Colon polyps   . DIABETES MELLITUS, TYPE II    takes Metformin and Glimepiride daily  . Diverticulosis   . GERD (gastroesophageal reflux disease)    takes Protonix daily  . History of colon polyps   . HYPERLIPIDEMIA    takes Atorvastatin daily  . HYPERTENSION    takes Amlodipine and Hyzaar daily  . Ileus following gastrointestinal surgery ([place]) 07/22/2012  . Joint pain   . Peripheral neuropathy   . Peripheral neuropathy   . PERIPHERAL NEUROPATHY, FEET 10/06/2007  . Renal disorder   . VITAMIN B12 DEFICIENCY 09/06/2008  . VITAMIN D DEFICIENCY 01/16/2010   takes Vitamin D daily    Tobacco History: Social History   Tobacco Use  Smoking Status Former Smoker  . Packs/day: 0.00  . Types: Cigarettes  Smokeless Tobacco Never Used  Tobacco Comment   quit smoking around 1990   Counseling given: Not Answered Comment: quit smoking around 1990   Continue to not smoke  Outpatient Encounter Medications as of 02/15/2020  Medication Sig  . aspirin 81 MG EC tablet Take 81 mg by mouth daily.  Marland Kitchen atorvastatin (LIPITOR) 40 MG tablet TAKE 1 TABLET BY MOUTH  DAILY  . carvedilol (COREG)  12.5 MG tablet Take 1 tablet (12.5 mg total) by mouth 2 (two) times daily with a meal.  . Cholecalciferol (VITAMIN D3) 1000 UNITS CAPS Take 1 capsule (1,000 Units total) by mouth daily.  . fluticasone furoate-vilanterol (BREO ELLIPTA) 100-25 MCG/INH AEPB Inhale 1 puff into the lungs daily.  Marland Kitchen glimepiride (AMARYL) 2 MG tablet TAKE 2 TABLET BY MOUTH  DAILY BEFORE BREAKFAST (Patient taking differently: Take 4 mg by mouth daily with breakfast.)  . glucose blood (ONE TOUCH ULTRA TEST) test strip Use as instructed E11.9  . ketoconazole (NIZORAL) 2 % cream Apply 1 application topically daily.  Marland Kitchen lidocaine (LIDODERM) 5 % Place 1 patch onto the skin  daily. Remove & Discard patch within 12 hours or as directed by MD  . losartan (COZAAR) 100 MG tablet TAKE 1 TABLET BY MOUTH  DAILY  . Multiple Vitamins-Minerals (MULTIVITAMIN PO) Take 1 tablet by mouth daily.  . NONFORMULARY OR COMPOUNDED ITEM Peripheral Neuropathy Cream: Bupivacaine 1%, Doxepin 3%, Gabapentin 6%, Pentoxifylline 3%, Topiramate 1% Order faxed to [facility]  . pantoprazole (PROTONIX) 40 MG tablet TAKE 1 TABLET BY MOUTH  DAILY  . tamsulosin (FLOMAX) 0.4 MG CAPS capsule TAKE 1 CAPSULE BY MOUTH  DAILY   No facility-administered encounter medications on file as of 02/15/2020.     Review of Systems  Review of Systems  No chest pain with exertion, no orthopnea or PND, denies any lower extremity swelling.  Comprehensive review of systems otherwise negative. Physical Exam  BP 136/76 (BP Location: Left Arm, Cuff Size: Normal)   Pulse 62   Temp (!) 97.4 F (36.3 C) (Temporal)   Ht _0  (1.905 m)   Wt 186 lb (84.4 kg)   SpO2 97%   BMI 23.25 kg/m   Wt Readings from Last 5 Encounters:  02/15/20 186 lb (84.4 kg)  01/29/20 187 lb (84.8 kg)  11/14/19 183 lb 12.8 oz (83.4 kg)  11/01/19 184 lb (83.5 kg)  10/04/19 182 lb (82.6 kg)    BMI Readings from Last 5 Encounters:  02/15/20 23.25 kg/m  01/29/20 23.69 kg/m  11/14/19 23.28  kg/m  11/01/19 23.62 kg/m  10/04/19 23.37 kg/m     Physical Exam General: Well-appearing, in no acute distress Eyes: EOMI, no icterus Neck: Supple, no JVP appreciated sitting upright Respiratory: Clear to auscultation bilaterally, normal work of breathing on room air Abdomen: Soft, bowel sounds present Extremities: Warm, no edema MSK: No synovitis, no joint effusions Neuro: Normal gait, no weakness Psych: Normal mood, full affect   Assessment & Plan:   Nicholas Patton is a 85 y.o. man with reported COPD with recent spirometry without fixed obstruction but with significant bronchodilator response whom we are seeing for follow up for evaluation of DOE.  DOE: PFTs are suggestive of asthma and this is likely a driver of symptoms. Improved with ICS/LABA therapy Increase as below. Additionally, DLCO is severely reduced which is related to emphysema seen on CT but also concern for contribution of pulmonary HTN given elevated PA pressures seen on TTE in 2016. Repeat TTE 10/2019 with mildly elevated PASP, RV enlargement but normal RV function and size. Can discuss further work up in future but given age not sure much role for pursuing aggressively at this time.  Asthma: Better with ICS/LABA. Rare albuterol use. Increase breo to high dose to see if helps with DOE.    Return in about 3 months (around 05/15/2020).   Lanier Clam, MD 02/15/2020

## 2020-02-23 ENCOUNTER — Telehealth: Payer: Self-pay | Admitting: Internal Medicine

## 2020-02-23 NOTE — Telephone Encounter (Signed)
   Patient calling to report he is still having discomfort in groin area even with the use of permethrin   Please advise

## 2020-02-23 NOTE — Telephone Encounter (Signed)
I would hold on further permethrin or similar tx since this did not work.  At his last visit, he mentioned I believe the scrotum for what he is calling the groin pain.  At this time I can refer to urology for the scrotum problem, or if he has more groin pain, I can refer to sports medicine on the first floor.  Just let me know

## 2020-02-23 NOTE — Telephone Encounter (Signed)
Sent to Dr. John. 

## 2020-02-26 ENCOUNTER — Other Ambulatory Visit: Payer: Self-pay | Admitting: Internal Medicine

## 2020-02-26 NOTE — Telephone Encounter (Signed)
Please refill as per office routine med refill policy (all routine meds refilled for 3 mo or monthly per pt preference up to one year from last visit, then month to month grace period for 3 mo, then further med refills will have to be denied)  

## 2020-02-27 MED ORDER — HYDROXYZINE HCL 10 MG PO TABS: 10.0000 mg | ORAL_TABLET | Freq: Three times a day (TID) | ORAL | 1 refills | Status: DC | PRN

## 2020-02-27 MED ORDER — KETOCONAZOLE 2 % EX CREA: 1.0000 "application " | TOPICAL_CREAM | Freq: Every day | CUTANEOUS | 1 refills | Status: DC

## 2020-02-27 NOTE — Telephone Encounter (Signed)
Sent to Dr. John. 

## 2020-02-27 NOTE — Telephone Encounter (Signed)
Not sure what to say, though I can give medicine for itching and a cream that is an anti-fungal treatment since that is sometimes a cause  Also I can refer to dermatology if he likes, but this often takes several wks as they are hard to get to see

## 2020-02-27 NOTE — Telephone Encounter (Signed)
Already done

## 2020-03-06 ENCOUNTER — Telehealth: Payer: Self-pay | Admitting: Pulmonary Disease

## 2020-03-06 MED ORDER — BREO ELLIPTA 200-25 MCG/INH IN AEPB: 1.0000 | INHALATION_SPRAY | Freq: Every day | RESPIRATORY_TRACT | 3 refills | Status: DC

## 2020-03-06 NOTE — Telephone Encounter (Signed)
Received completed [place] patient assistance forms for Breo 200. Will print RX for Breo 200 and place in MH's sign folder. Will fax once the RX has been signed.

## 2020-03-11 DIAGNOSIS — H40013 Open angle with borderline findings, low risk, bilateral: Secondary | ICD-10-CM | POA: Diagnosis not present

## 2020-03-11 DIAGNOSIS — H43821 Vitreomacular adhesion, right eye: Secondary | ICD-10-CM | POA: Diagnosis not present

## 2020-03-11 DIAGNOSIS — H4322 Crystalline deposits in vitreous body, left eye: Secondary | ICD-10-CM | POA: Diagnosis not present

## 2020-03-11 DIAGNOSIS — E119 Type 2 diabetes mellitus without complications: Secondary | ICD-10-CM | POA: Diagnosis not present

## 2020-03-11 DIAGNOSIS — H04123 Dry eye syndrome of bilateral lacrimal glands: Secondary | ICD-10-CM | POA: Diagnosis not present

## 2020-03-11 DIAGNOSIS — Z961 Presence of intraocular lens: Secondary | ICD-10-CM | POA: Diagnosis not present

## 2020-03-11 LAB — HM DIABETES EYE EXAM

## 2020-03-13 ENCOUNTER — Encounter: Payer: Self-pay | Admitting: Internal Medicine

## 2020-03-18 ENCOUNTER — Telehealth: Payer: Self-pay | Admitting: Pulmonary Disease

## 2020-03-18 MED ORDER — BREO ELLIPTA 200-25 MCG/INH IN AEPB: 1.0000 | INHALATION_SPRAY | Freq: Every day | RESPIRATORY_TRACT | 3 refills | Status: DC

## 2020-03-18 NOTE — Telephone Encounter (Signed)
Spoke with pt and he is requesting a 3 month supply of Breo 200 be sent to [facility]. Rx sent to pharmacy. Pt verbalized understanding. Nothing further needed.

## 2020-03-22 DIAGNOSIS — Z20822 Contact with and (suspected) exposure to covid-19: Secondary | ICD-10-CM | POA: Diagnosis not present

## 2020-04-05 ENCOUNTER — Encounter: Payer: Self-pay | Admitting: Internal Medicine

## 2020-04-05 ENCOUNTER — Other Ambulatory Visit: Payer: Self-pay

## 2020-04-05 ENCOUNTER — Ambulatory Visit (INDEPENDENT_AMBULATORY_CARE_PROVIDER_SITE_OTHER): Payer: Medicare Other | Admitting: Internal Medicine

## 2020-04-05 VITALS — BP 162/82 | HR 64 | Temp 97.6°F | Ht 75.0 in | Wt 184.0 lb

## 2020-04-05 DIAGNOSIS — I10 Essential (primary) hypertension: Secondary | ICD-10-CM

## 2020-04-05 DIAGNOSIS — R310 Gross hematuria: Secondary | ICD-10-CM | POA: Diagnosis not present

## 2020-04-05 DIAGNOSIS — I700 Atherosclerosis of aorta: Secondary | ICD-10-CM | POA: Diagnosis not present

## 2020-04-05 DIAGNOSIS — E1140 Type 2 diabetes mellitus with diabetic neuropathy, unspecified: Secondary | ICD-10-CM | POA: Diagnosis not present

## 2020-04-05 DIAGNOSIS — L291 Pruritus scroti: Secondary | ICD-10-CM

## 2020-04-05 DIAGNOSIS — E7849 Other hyperlipidemia: Secondary | ICD-10-CM

## 2020-04-05 LAB — BASIC METABOLIC PANEL
BUN: 26 mg/dL — ABNORMAL HIGH (ref 6–23)
CO2: 28 mEq/L (ref 19–32)
Calcium: 9.8 mg/dL (ref 8.4–10.5)
Chloride: 106 mEq/L (ref 96–112)
Creatinine, Ser: 1.72 mg/dL — ABNORMAL HIGH (ref 0.40–1.50)
GFR: 36.25 mL/min — ABNORMAL LOW (ref >60.00)
Glucose, Bld: 147 mg/dL — ABNORMAL HIGH (ref 70–99)
Potassium: 4.2 mEq/L (ref 3.5–5.1)
Sodium: 140 mEq/L (ref 135–145)

## 2020-04-05 LAB — LIPID PANEL
Cholesterol: 123 mg/dL (ref 0–200)
HDL: 47.2 mg/dL (ref >39.00)
LDL Cholesterol: 63 mg/dL (ref 0–99)
NonHDL: 75.38
Total CHOL/HDL Ratio: 3
Triglycerides: 60 mg/dL (ref 0.0–149.0)
VLDL: 12 mg/dL (ref 0.0–40.0)

## 2020-04-05 LAB — URINALYSIS, ROUTINE W REFLEX MICROSCOPIC
Bilirubin Urine: NEGATIVE
Hgb urine dipstick: NEGATIVE
Ketones, ur: NEGATIVE
Leukocytes,Ua: NEGATIVE
Nitrite: NEGATIVE
Specific Gravity, Urine: 1.015 (ref 1.000–1.030)
Total Protein, Urine: NEGATIVE
Urine Glucose: NEGATIVE
Urobilinogen, UA: 0.2 (ref 0.0–1.0)
pH: 6 (ref 5.0–8.0)

## 2020-04-05 LAB — HEMOGLOBIN A1C: Hgb A1c MFr Bld: 7.7 % — ABNORMAL HIGH (ref 4.6–6.5)

## 2020-04-05 LAB — HEPATIC FUNCTION PANEL
ALT: 24 U/L (ref 0–53)
AST: 25 U/L (ref 0–37)
Albumin: 4.2 g/dL (ref 3.5–5.2)
Alkaline Phosphatase: 109 U/L (ref 39–117)
Bilirubin, Direct: 0.1 mg/dL (ref 0.0–0.3)
Total Bilirubin: 0.7 mg/dL (ref 0.2–1.2)
Total Protein: 7.1 g/dL (ref 6.0–8.3)

## 2020-04-05 NOTE — Patient Instructions (Signed)
There is no need for further nix  Please try OTC benadryl cream for the itching  You will be contacted regarding the referral for: Dr Jeffie Pollock  Please continue all other medications as before, and refills have been done if requested.  Please have the pharmacy call with any other refills you may need.  Please continue your efforts at being more active, low cholesterol diabetic diet, and weight control.  Please keep your appointments with your specialists as you may have planned  Please go to the LAB at the blood drawing area for the tests to be done  You will be contacted by phone if any changes need to be made immediately.  Otherwise, you will receive a letter about your results with an explanation, but please check with MyChart first.  Please remember to sign up for MyChart if you have not done so, as this will be important to you in the future with finding out test results, communicating by private email, and scheduling acute appointments online when needed.  Please make an Appointment to return in 6 months, or sooner if needed

## 2020-04-05 NOTE — Progress Notes (Addendum)
Established Patient Office Visit  Subjective:  Patient ID: Nicholas Patton, male    DOB: 13-Jan-1935  Age: 86 y.o. MRN: [id number]   Has not yet seen per dermatology, cant be seen until may, still itching despite nix x 2 and antifungal cream.  .       Chief Complaint: follow up HTN, itching of the scrotum, gross hematuria, hld, and aortic atherosclerosis       HPI:  Nicholas Patton is a 87 y.o. male here with c/o ongoing itching to the bilateral scrotum areas since last visit despite tx with nix x 2 and antifungal cream.  Has appt with dermatology but unable to be seen until may.  He continues to be adamant and fixated on the "lice" that must be present and untreated, and "we got to do something".  Also incidentally noted an episode recently of small volume gross hematuria a few days ago, but Denies urinary symptoms such as dysuria, frequency, urgency, flank pain, or n/v, fever, chills.  Pt denies chest pain, increased sob or doe, wheezing, orthopnea, PND, increased LE swelling, palpitations, dizziness or syncope.  Pt denies new neurological symptoms such as new headache, or facial or extremity weakness or numbness   Pt denies polydipsia, polyuria.  Tolerating statin, and currently non smoker .        Wt Readings from Last 3 Encounters:  04/05/20 184 lb (83.5 kg)  02/15/20 186 lb (84.4 kg)  01/29/20 187 lb (84.8 kg)   BP Readings from Last 3 Encounters:  04/05/20 (!) 162/82  02/15/20 136/76  01/29/20 (!) 180/90        Also noted: Immunization History  Administered Date(s) Administered  . Fluad Quad(high Dose 65+) 11/09/2018, 11/30/2019  . Influenza Whole 11/21/2015  . Influenza, High Dose Seasonal PF 01/24/2013, 12/01/2016, 11/22/2017  . Influenza,inj,Quad PF,6+ Mos 12/08/2013  . Influenza-Unspecified 02/08/2015  . PFIZER(Purple Top)SARS-COV-2 Vaccination 04/03/2019, 04/10/2019, 12/23/2019  . Pneumococcal Conjugate-13 03/22/2013  . Pneumococcal Polysaccharide-23 01/10/2016  . Td  10/05/2014   There are no preventive care reminders to display for this patient.  Past Medical History:  Diagnosis Date  . Anemia   . Arthritis   . Cataract    REMOVED BILATERAL  . Colon polyps   . DIABETES MELLITUS, TYPE II    takes Metformin and Glimepiride daily  . Diverticulosis   . GERD (gastroesophageal reflux disease)    takes Protonix daily  . History of colon polyps   . HYPERLIPIDEMIA    takes Atorvastatin daily  . HYPERTENSION    takes Amlodipine and Hyzaar daily  . Ileus following gastrointestinal surgery ([place]) 07/22/2012  . Joint pain   . Peripheral neuropathy   . Peripheral neuropathy   . PERIPHERAL NEUROPATHY, FEET 10/06/2007  . Renal disorder   . VITAMIN B12 DEFICIENCY 09/06/2008  . VITAMIN D DEFICIENCY 01/16/2010   takes Vitamin D daily   Past Surgical History:  Procedure Laterality Date  . CATARACT EXTRACTION     both eyes  . CHOLECYSTECTOMY N/A 07/18/2012   Procedure: LAPAROSCOPIC CHOLECYSTECTOMY WITH INTRAOPERATIVE CHOLANGIOGRAM;  Surgeon: Zenovia Jarred, MD;  Location: [facility];  Service: General;  Laterality: N/A;  . COLONOSCOPY    . POLYPECTOMY    . POSTERIOR CERVICAL FUSION/FORAMINOTOMY N/A 06/27/2015   Procedure: Posterior Cervical Fusion with lateral mass fixation Cervical four to cervical seven;  Surgeon: Eustace Moore, MD;  Location: [facility];  Service: Neurosurgery;  Laterality: N/A;  . TOTAL KNEE ARTHROPLASTY  2001  Left  . TURP VAPORIZATION      reports that he has quit smoking. His smoking use included cigarettes. He smoked 0.00 packs per day. He has never used smokeless tobacco. He reports that he does not drink alcohol and does not use drugs. family history includes Diabetes Mellitus II in his mother; Lung cancer in his brother. Allergies  Allergen Reactions  . Amlodipine Swelling  . Cymbalta [Duloxetine Hcl]   . Oxycodone    Current Outpatient Medications on File Prior to Visit  Medication Sig Dispense Refill  . aspirin 81 MG EC  tablet Take 81 mg by mouth daily.    Marland Kitchen atorvastatin (LIPITOR) 40 MG tablet TAKE 1 TABLET BY MOUTH  DAILY 90 tablet 3  . carvedilol (COREG) 12.5 MG tablet TAKE 1 TABLET BY MOUTH  TWICE DAILY WITH MEALS 180 tablet 3  . Cholecalciferol (VITAMIN D3) 1000 UNITS CAPS Take 1 capsule (1,000 Units total) by mouth daily. 90 capsule 3  . fluticasone furoate-vilanterol (BREO ELLIPTA) 200-25 MCG/INH AEPB Inhale 1 puff into the lungs daily. 180 each 3  . glimepiride (AMARYL) 2 MG tablet TAKE 2 TABLETS BY MOUTH  DAILY BEFORE BREAKFAST 180 tablet 3  . glucose blood (ONE TOUCH ULTRA TEST) test strip Use as instructed E11.9 200 each 12  . ketoconazole (NIZORAL) 2 % cream Apply 1 application topically daily. 30 g 1  . lidocaine (LIDODERM) 5 % Place 1 patch onto the skin daily. Remove & Discard patch within 12 hours or as directed by MD 30 patch 1  . losartan (COZAAR) 100 MG tablet TAKE 1 TABLET BY MOUTH  DAILY 90 tablet 3  . Multiple Vitamins-Minerals (MULTIVITAMIN PO) Take 1 tablet by mouth daily.    . NONFORMULARY OR COMPOUNDED ITEM Peripheral Neuropathy Cream: Bupivacaine 1%, Doxepin 3%, Gabapentin 6%, Pentoxifylline 3%, Topiramate 1% Order faxed to [facility] 1 each 3  . pantoprazole (PROTONIX) 40 MG tablet TAKE 1 TABLET BY MOUTH  DAILY 90 tablet 3  . tamsulosin (FLOMAX) 0.4 MG CAPS capsule TAKE 1 CAPSULE BY MOUTH  DAILY 90 capsule 3   No current facility-administered medications on file prior to visit.        ROS:  All others reviewed and negative.  Objective        PE:  BP (!) 162/82 (BP Location: Left Arm, Patient Position: Sitting, Cuff Size: Normal)   Pulse 64   Temp 97.6 F (36.4 C) (Oral)   Ht _0  (1.905 m)   Wt 184 lb (83.5 kg)   SpO2 97%   BMI 23.00 kg/m                 Constitutional: Pt appears in NAD               HENT: Head: NCAT.                Right Ear: External ear normal.                 Left Ear: External ear normal.                Eyes: . Pupils are equal, round,  and reactive to light. Conjunctivae and EOM are normal               Nose: without d/c or deformity               Neck: Neck supple. Gross normal ROM  Cardiovascular: Normal rate and regular rhythm.                 Pulmonary/Chest: Effort normal and breath sounds without rales or wheezing.                Abd:  Soft, NT, ND, + BS, no organomegaly               Neurological: Pt is alert. At baseline orientation, motor grossly intact               Skin: Skin is warm. No rashes, no other new lesions, LE edema - none               GU:  Normal appearing bilateral scrotal skin hairless after he has shaved, and no lice seen               Psychiatric: Pt behavior is normal without agitation   Assessment/Plan:  Nicholas Patton is a 86 y.o. Black or African American [2] male with  has a past medical history of Anemia, Arthritis, Cataract, Colon polyps, DIABETES MELLITUS, TYPE II, Diverticulosis, GERD (gastroesophageal reflux disease), History of colon polyps, HYPERLIPIDEMIA, HYPERTENSION, Ileus following gastrointestinal surgery ([place]) (07/22/2012), Joint pain, Peripheral neuropathy, Peripheral neuropathy, PERIPHERAL NEUROPATHY, FEET (10/06/2007), Renal disorder, VITAMIN B12 DEFICIENCY (09/06/2008), and VITAMIN D DEFICIENCY (01/16/2010).   Micro: none  Cardiac tracings I have personally interpreted today:  none  Pertinent Radiological findings (summarize): none   Lab Results  Component Value Date   WBC 3.9 (L) 10/25/2019   HGB 14.4 10/25/2019   HCT 44.0 10/25/2019   PLT 151 10/25/2019   GLUCOSE 147 (H) 04/05/2020   CHOL 123 04/05/2020   TRIG 60.0 04/05/2020   HDL 47.20 04/05/2020   LDLCALC 63 04/05/2020   ALT 24 04/05/2020   AST 25 04/05/2020   NA 140 04/05/2020   K 4.2 04/05/2020   CL 106 04/05/2020   CREATININE 1.72 (H) 04/05/2020   BUN 26 (H) 04/05/2020   CO2 28 04/05/2020   TSH 4.48 04/06/2019   PSA 4.61 (H) 04/09/2017   INR 1.12 06/19/2015   HGBA1C 7.7 (H) 04/05/2020    MICROALBUR 0.8 04/06/2019     Assessment & Plan:   Problem List Items Addressed This Visit      High   Gross hematuria - Primary    Very small volume x 1 a few days ago, o/w asympt, for UA and refer Urology      Relevant Orders   Ambulatory referral to Urology   Urinalysis, Routine w reflex microscopic (Completed)   Aortic atherosclerosis (HCC)    Stable, to continue lipitor and low chol diet        Medium   Scrotal itching    Etiology unclear, exam benign, pt very fixated on this, also for urology referral, to f/u derm if not helpful      Relevant Orders   Ambulatory referral to Urology   Hyperlipidemia    Lab Results  Component Value Date   [place] 63 04/05/2020   Stable, pt to continue current statin lipitor  Current Outpatient Medications (Endocrine & Metabolic):  .  glimepiride (AMARYL) 2 MG tablet, TAKE 2 TABLETS BY MOUTH  DAILY BEFORE BREAKFAST  Current Outpatient Medications (Cardiovascular):  .  atorvastatin (LIPITOR) 40 MG tablet, TAKE 1 TABLET BY MOUTH  DAILY .  carvedilol (COREG) 12.5 MG tablet, TAKE 1 TABLET BY MOUTH  TWICE DAILY WITH MEALS .  losartan (COZAAR) 100 MG tablet,  TAKE 1 TABLET BY MOUTH  DAILY  Current Outpatient Medications (Respiratory):  .  fluticasone furoate-vilanterol (BREO ELLIPTA) 200-25 MCG/INH AEPB, Inhale 1 puff into the lungs daily.  Current Outpatient Medications (Analgesics):  .  aspirin 81 MG EC tablet, Take 81 mg by mouth daily.   Current Outpatient Medications (Other):  Marland Kitchen  Cholecalciferol (VITAMIN D3) 1000 UNITS CAPS, Take 1 capsule (1,000 Units total) by mouth daily. Marland Kitchen  glucose blood (ONE TOUCH ULTRA TEST) test strip, Use as instructed E11.9 .  ketoconazole (NIZORAL) 2 % cream, Apply 1 application topically daily. Marland Kitchen  lidocaine (LIDODERM) 5 %, Place 1 patch onto the skin daily. Remove & Discard patch within 12 hours or as directed by MD .  Multiple Vitamins-Minerals (MULTIVITAMIN PO), Take 1 tablet by mouth daily. .   NONFORMULARY OR COMPOUNDED ITEM, Peripheral Neuropathy Cream: Bupivacaine 1%, Doxepin 3%, Gabapentin 6%, Pentoxifylline 3%, Topiramate 1% Order faxed to [facility] .  pantoprazole (PROTONIX) 40 MG tablet, TAKE 1 TABLET BY MOUTH  DAILY .  tamsulosin (FLOMAX) 0.4 MG CAPS capsule, TAKE 1 CAPSULE BY MOUTH  DAILY       Essential hypertension    BP Readings from Last 3 Encounters:  04/05/20 (!) 162/82  02/15/20 136/76  01/29/20 (!) 180/90   Uncontrolled, pt states itching driving this, declines med change,, pt to continue medical treatment coreg, cozaar, and I asked to check BP at home daily for 10 days and call with average       Diabetes [facility])    Lab Results  Component Value Date   HGBA1C 7.7 (H) 04/05/2020   Stable, pt to continue current medical treatment amaryl       Relevant Orders   Hemoglobin A1c (Completed)   Lipid panel (Completed)   Hepatic function panel (Completed)   Basic metabolic panel (Completed)      No orders of the defined types were placed in this encounter.   Follow-up: Return in about 6 months (around 10/03/2020).    Cathlean Cower, MD 04/06/2020 2:14 PM [facility]

## 2020-04-05 NOTE — Assessment & Plan Note (Signed)
Very small volume x 1 a few days ago, o/w asympt, for UA and refer Urology

## 2020-04-06 ENCOUNTER — Encounter: Payer: Self-pay | Admitting: Internal Medicine

## 2020-04-06 DIAGNOSIS — L291 Pruritus scroti: Secondary | ICD-10-CM | POA: Insufficient documentation

## 2020-04-06 NOTE — Assessment & Plan Note (Addendum)
Lab Results  Component Value Date   HGBA1C 7.7 (H) 04/05/2020   Stable, pt to continue current medical treatment amaryl

## 2020-04-06 NOTE — Assessment & Plan Note (Signed)
Etiology unclear, exam benign, pt very fixated on this, also for urology referral, to f/u derm if not helpful

## 2020-04-06 NOTE — Assessment & Plan Note (Addendum)
BP Readings from Last 3 Encounters:  04/05/20 (!) 162/82  02/15/20 136/76  01/29/20 (!) 180/90   Uncontrolled, pt states itching driving this, declines med change,, pt to continue medical treatment coreg, cozaar, and I asked to check BP at home daily for 10 days and call with average

## 2020-04-06 NOTE — Assessment & Plan Note (Signed)
Lab Results  Component Value Date   LDLCALC 63 04/05/2020   Stable, pt to continue current statin lipitor  Current Outpatient Medications (Endocrine & Metabolic):  .  glimepiride (AMARYL) 2 MG tablet, TAKE 2 TABLETS BY MOUTH  DAILY BEFORE BREAKFAST  Current Outpatient Medications (Cardiovascular):  .  atorvastatin (LIPITOR) 40 MG tablet, TAKE 1 TABLET BY MOUTH  DAILY .  carvedilol (COREG) 12.5 MG tablet, TAKE 1 TABLET BY MOUTH  TWICE DAILY WITH MEALS .  losartan (COZAAR) 100 MG tablet, TAKE 1 TABLET BY MOUTH  DAILY  Current Outpatient Medications (Respiratory):  .  fluticasone furoate-vilanterol (BREO ELLIPTA) 200-25 MCG/INH AEPB, Inhale 1 puff into the lungs daily.  Current Outpatient Medications (Analgesics):  .  aspirin 81 MG EC tablet, Take 81 mg by mouth daily.   Current Outpatient Medications (Other):  Marland Kitchen  Cholecalciferol (VITAMIN D3) 1000 UNITS CAPS, Take 1 capsule (1,000 Units total) by mouth daily. Marland Kitchen  glucose blood (ONE TOUCH ULTRA TEST) test strip, Use as instructed E11.9 .  ketoconazole (NIZORAL) 2 % cream, Apply 1 application topically daily. Marland Kitchen  lidocaine (LIDODERM) 5 %, Place 1 patch onto the skin daily. Remove & Discard patch within 12 hours or as directed by MD .  Multiple Vitamins-Minerals (MULTIVITAMIN PO), Take 1 tablet by mouth daily. .  NONFORMULARY OR COMPOUNDED ITEM, Peripheral Neuropathy Cream: Bupivacaine 1%, Doxepin 3%, Gabapentin 6%, Pentoxifylline 3%, Topiramate 1% Order faxed to [facility] .  pantoprazole (PROTONIX) 40 MG tablet, TAKE 1 TABLET BY MOUTH  DAILY .  tamsulosin (FLOMAX) 0.4 MG CAPS capsule, TAKE 1 CAPSULE BY MOUTH  DAILY

## 2020-04-06 NOTE — Assessment & Plan Note (Signed)
Stable, to continue lipitor and low chol diet

## 2020-04-08 DIAGNOSIS — R972 Elevated prostate specific antigen [PSA]: Secondary | ICD-10-CM | POA: Diagnosis not present

## 2020-04-08 DIAGNOSIS — R310 Gross hematuria: Secondary | ICD-10-CM | POA: Diagnosis not present

## 2020-04-19 NOTE — Telephone Encounter (Signed)
Application has been signed. Will fax to [facility].

## 2020-04-23 DIAGNOSIS — K5730 Diverticulosis of large intestine without perforation or abscess without bleeding: Secondary | ICD-10-CM | POA: Diagnosis not present

## 2020-04-23 DIAGNOSIS — R310 Gross hematuria: Secondary | ICD-10-CM | POA: Diagnosis not present

## 2020-04-23 DIAGNOSIS — N400 Enlarged prostate without lower urinary tract symptoms: Secondary | ICD-10-CM | POA: Diagnosis not present

## 2020-04-29 DIAGNOSIS — L821 Other seborrheic keratosis: Secondary | ICD-10-CM | POA: Diagnosis not present

## 2020-04-29 DIAGNOSIS — L309 Dermatitis, unspecified: Secondary | ICD-10-CM | POA: Diagnosis not present

## 2020-05-06 ENCOUNTER — Ambulatory Visit (INDEPENDENT_AMBULATORY_CARE_PROVIDER_SITE_OTHER): Payer: Medicare Other | Admitting: Podiatry

## 2020-05-06 ENCOUNTER — Encounter: Payer: Self-pay | Admitting: Podiatry

## 2020-05-06 ENCOUNTER — Other Ambulatory Visit: Payer: Self-pay

## 2020-05-06 DIAGNOSIS — M2041 Other hammer toe(s) (acquired), right foot: Secondary | ICD-10-CM

## 2020-05-06 DIAGNOSIS — B351 Tinea unguium: Secondary | ICD-10-CM

## 2020-05-06 DIAGNOSIS — M79675 Pain in left toe(s): Secondary | ICD-10-CM | POA: Diagnosis not present

## 2020-05-06 DIAGNOSIS — L84 Corns and callosities: Secondary | ICD-10-CM | POA: Diagnosis not present

## 2020-05-06 DIAGNOSIS — E1142 Type 2 diabetes mellitus with diabetic polyneuropathy: Secondary | ICD-10-CM

## 2020-05-06 DIAGNOSIS — M2042 Other hammer toe(s) (acquired), left foot: Secondary | ICD-10-CM

## 2020-05-06 DIAGNOSIS — M79674 Pain in right toe(s): Secondary | ICD-10-CM | POA: Diagnosis not present

## 2020-05-06 NOTE — Progress Notes (Signed)
Subjective: Nicholas Patton presents today at risk foot care with history of diabetic neuropathy and callus(es) b/l feet and painful mycotic toenails b/l that are difficult to trim. Pain interferes with ambulation. Aggravating factors include wearing enclosed shoe gear. Pain is relieved with periodic professional debridement.   He voices no new pedal concerns on today's visit. He states blood glucose was 132 mg/dl this morning.  [name], MD is patient's PCP. Last visit was: 04/05/2020.  Allergies  Allergen Reactions  . Amlodipine Swelling  . Cymbalta [Duloxetine Hcl]   . Oxycodone     Objective: Nicholas Patton is a pleasant 86 y.o. African American male, WD, WN in NAD. AAO x 3.   There were no vitals filed for this visit.  Vascular Examination: Capillary refill time to digits immediate b/l. Palpable DP pulses b/l. Palpable PT pulses b/l. Pedal hair absent b/l Skin temperature gradient within normal limits b/l. No edema noted b/l.  Dermatological Examination: Pedal skin with normal turgor, texture and tone bilaterally. No open wounds bilaterally. No interdigital macerations bilaterally. Toenails 1-5 b/l elongated, dystrophic, thickened, crumbly with subungual debris and tenderness to dorsal palpation. Hyperkeratotic lesion(s) L hallux, L 2nd toe, submet head 1 left foot and submet head 1 right foot.  No erythema, no edema, no drainage, no flocculence.  Musculoskeletal: Normal muscle strength 5/5 to all lower extremity muscle groups bilaterally. No pain crepitus or joint limitation noted with ROM b/l. Hallux valgus with bunion deformity noted b/l. Hammertoes noted to the 2-5 bilaterally.  Neurological Examination: Protective sensation intact 5/5 intact bilaterally with 10g monofilament b/l. Patient has subjective symptoms of neuropathy. Babinski reflex negative b/l. Clonus negative b/l.  Assessment: 1. Pain due to onychomycosis of toenails of both feet   2. Corns and callosities    3. Acquired hammertoes of both feet   4. Diabetic peripheral neuropathy associated with type 2 diabetes mellitus ([place])    Plan: -Examined patient. -No new findings. No new orders. -Continue diabetic foot care principles. -Toenails 1-5 b/l were debrided in length and girth with sterile nail nippers and dremel without iatrogenic bleeding.  -Corn(s) L hallux and L 2nd toe and callus(es) submet head 1 left foot and submet head 1 right foot were pared utilizing sterile scalpel blade without incident. Total number debrided =4. -Patient to report any pedal injuries to medical professional immediately. -Patient/POA to call should there be question/concern in the interim.  Return in about 3 months (around 08/03/2020).  Marzetta Board, DPM

## 2020-05-15 ENCOUNTER — Ambulatory Visit (INDEPENDENT_AMBULATORY_CARE_PROVIDER_SITE_OTHER): Payer: Medicare Other | Admitting: Pulmonary Disease

## 2020-05-15 ENCOUNTER — Other Ambulatory Visit: Payer: Self-pay

## 2020-05-15 ENCOUNTER — Encounter: Payer: Self-pay | Admitting: Pulmonary Disease

## 2020-05-15 VITALS — BP 124/70 | HR 60 | Temp 98.1°F | Ht 75.0 in | Wt 183.1 lb

## 2020-05-15 DIAGNOSIS — R0600 Dyspnea, unspecified: Secondary | ICD-10-CM

## 2020-05-15 DIAGNOSIS — J4520 Mild intermittent asthma, uncomplicated: Secondary | ICD-10-CM | POA: Diagnosis not present

## 2020-05-15 NOTE — Progress Notes (Signed)
Patient ID: Nicholas Patton, male    DOB: Jun 02, 1934, 86 y.o.   MRN: [id number]  Chief Complaint  Patient presents with  . Follow-up    3 month f/u. States he has been doing well since last visit.     Referring provider: [name], MD  HPI:   Nicholas Patton is a 86 y.o. man with reported "COPD" with recent spirometry without fixed obstruction but with significant bronchodilator response whom we are seeing in follow up for asthma, DOE.  Breathing stable.  Not sure how much [employer] [place] is helping.  May be some.  Still has dyspnea on exertion.  Having issues with [place] financial assistance.  They sent multiple letter to sign forms.  He says he is done this multiple times.  Trying to troubleshoot in the office today.  No increased cough, sputum production.  No fever or chills.  Notes he is less active than prior.  HPI at initial visit: Has had some mild dyspnea exertion going on some time now.  At least months to years.  He has been told he has COPD in the past but has never had spirometry.  Shortness of breath worse walking longer distances or on inclines.  Rest resolve symptoms.  No real shortness of breath at rest.  He has hayfever, seasonal allergies regularly.  Will use medicines to treat this when bad.  No recurrent pneumonias in the past no real bad bouts of bronchitis.  No asthma or exertional limitation as a child.  No syncope or presyncope.  Reportedly has occasionally received prednisone for some breathing difficulties in the past.  Is been at least 2 years or more since last use.  Spirometry recently demonstrates ratio at 68 to 69% but given his age this is over 90% predicted and within normal limits.  Significant bronchodilator response in both FEV1 and FVC.  CT scan 05/2019 personally reviewed and interpreted as diffuse significant upper lobe predominant emphysema without other significant abnormality.  PMH: Diabetes, hypertension Surgical history: Cataracts, neck surgery/fusion next  knee replacement, cholecystectomy Family history: No history of lung cancer Social history: Former smoker, quit late 80s, lives in [place] / Pulmonary Flowsheets:   ACT:  No flowsheet data found.  MMRC: mMRC Dyspnea Scale mMRC Score  05/15/2020 0    Epworth:  No flowsheet data found.  Tests:   FENO:  No results found for: NITRICOXIDE  PFT: PFT Results Latest Ref Rng & Units 10/04/2019  FVC-Pre L 3.21  FVC-Predicted Pre % 78  FVC-Post L 3.67  FVC-Predicted Post % 90  Pre FEV1/FVC % % 68  Post FEV1/FCV % % 69  FEV1-Pre L 2.18  FEV1-Predicted Pre % 73  FEV1-Post L 2.55  DLCO uncorrected ml/min/mmHg 12.86  DLCO UNC% % 49  DLCO corrected ml/min/mmHg 12.94  DLCO COR %Predicted % 49  DLVA Predicted % 50  TLC L 7.34  TLC % Predicted % 95  RV % Predicted % 120    WALK:  No flowsheet data found.  Imaging: No results found.  Lab Results:  CBC    Component Value Date/Time   WBC 3.9 (L) 10/25/2019 1052   WBC 3.5 (L) 10/04/2019 1115   RBC 4.83 10/25/2019 1052   HGB 14.4 10/25/2019 1052   HCT 44.0 10/25/2019 1052   PLT 151 10/25/2019 1052   MCV 91.1 10/25/2019 1052   MCH 29.8 10/25/2019 1052   MCHC 32.7 10/25/2019 1052   RDW 13.9 10/25/2019 1052   LYMPHSABS 0.9  10/25/2019 1052   MONOABS 0.4 10/25/2019 1052   EOSABS 0.2 10/25/2019 1052   BASOSABS 0.0 10/25/2019 1052    BMET    Component Value Date/Time   NA 140 04/05/2020 1118   K 4.2 04/05/2020 1118   CL 106 04/05/2020 1118   CO2 28 04/05/2020 1118   GLUCOSE 147 (H) 04/05/2020 1118   BUN 26 (H) 04/05/2020 1118   CREATININE 1.72 (H) 04/05/2020 1118   CREATININE 1.76 (H) 10/25/2019 1052   CREATININE 1.68 (H) 10/04/2019 1115   CALCIUM 9.8 04/05/2020 1118   GFRNONAA 35 (L) 10/25/2019 1052   GFRNONAA 37 (L) 10/04/2019 1115   GFRAA 41 (L) 10/25/2019 1052   GFRAA 43 (L) 10/04/2019 1115    BNP    Component Value Date/Time   BNP 48.5 05/18/2019 1918    ProBNP No results found for:  PROBNP  Specialty Problems      Pulmonary Problems   Allergic rhinitis    Qualifier: Diagnosis of  By: [name] MD, Hunt Oris       Acute upper respiratory infection   Dyspnea   Left-sided nosebleed   COPD (chronic obstructive pulmonary disease) (HCC)      Allergies  Allergen Reactions  . Amlodipine Swelling  . Cymbalta [Duloxetine Hcl]   . Oxycodone     Immunization History  Administered Date(s) Administered  . Fluad Quad(high Dose 65+) 11/09/2018, 11/30/2019  . Influenza Whole 11/21/2015  . Influenza, High Dose Seasonal PF 01/24/2013, 12/01/2016, 11/22/2017  . Influenza,inj,Quad PF,6+ Mos 12/08/2013  . Influenza-Unspecified 02/08/2015  . PFIZER(Purple Top)SARS-COV-2 Vaccination 04/03/2019, 04/10/2019, 12/23/2019  . Pneumococcal Conjugate-13 03/22/2013  . Pneumococcal Polysaccharide-23 01/10/2016  . Td 10/05/2014    Past Medical History:  Diagnosis Date  . Anemia   . Arthritis   . Cataract    REMOVED BILATERAL  . Colon polyps   . DIABETES MELLITUS, TYPE II    takes Metformin and Glimepiride daily  . Diverticulosis   . GERD (gastroesophageal reflux disease)    takes Protonix daily  . History of colon polyps   . HYPERLIPIDEMIA    takes Atorvastatin daily  . HYPERTENSION    takes Amlodipine and Hyzaar daily  . Ileus following gastrointestinal surgery ([place]) 07/22/2012  . Joint pain   . Peripheral neuropathy   . Peripheral neuropathy   . PERIPHERAL NEUROPATHY, FEET 10/06/2007  . Renal disorder   . VITAMIN B12 DEFICIENCY 09/06/2008  . VITAMIN D DEFICIENCY 01/16/2010   takes Vitamin D daily    Tobacco History: Social History   Tobacco Use  Smoking Status Former Smoker  . Packs/day: 0.00  . Types: Cigarettes  Smokeless Tobacco Never Used  Tobacco Comment   quit smoking around 1990   Counseling given: Not Answered Comment: quit smoking around 1990   Continue to not smoke  Outpatient Encounter Medications as of 05/15/2020  Medication Sig  . aspirin 81  MG EC tablet Take 81 mg by mouth daily.  Marland Kitchen atorvastatin (LIPITOR) 40 MG tablet TAKE 1 TABLET BY MOUTH  DAILY  . carvedilol (COREG) 12.5 MG tablet TAKE 1 TABLET BY MOUTH  TWICE DAILY WITH MEALS  . Cholecalciferol (VITAMIN D3) 1000 UNITS CAPS Take 1 capsule (1,000 Units total) by mouth daily.  . fluticasone furoate-vilanterol (BREO ELLIPTA) 200-25 MCG/INH AEPB Inhale 1 puff into the lungs daily.  Marland Kitchen glimepiride (AMARYL) 2 MG tablet TAKE 2 TABLETS BY MOUTH  DAILY BEFORE BREAKFAST  . glucose blood (ONE TOUCH ULTRA TEST) test strip Use as instructed E11.9  .  hydrOXYzine (ATARAX/VISTARIL) 10 MG tablet   . ketoconazole (NIZORAL) 2 % cream Apply 1 application topically daily.  Marland Kitchen lidocaine (LIDODERM) 5 % Place 1 patch onto the skin daily. Remove & Discard patch within 12 hours or as directed by MD  . losartan (COZAAR) 100 MG tablet TAKE 1 TABLET BY MOUTH  DAILY  . Multiple Vitamins-Minerals (MULTIVITAMIN PO) Take 1 tablet by mouth daily.  . NONFORMULARY OR COMPOUNDED ITEM Peripheral Neuropathy Cream: Bupivacaine 1%, Doxepin 3%, Gabapentin 6%, Pentoxifylline 3%, Topiramate 1% Order faxed to [facility]  . NYSTATIN powder SMARTSIG:Sparingly Topical 2-3 Times Daily  . pantoprazole (PROTONIX) 40 MG tablet TAKE 1 TABLET BY MOUTH  DAILY  . tamsulosin (FLOMAX) 0.4 MG CAPS capsule TAKE 1 CAPSULE BY MOUTH  DAILY  . triamcinolone (KENALOG) 0.1 % APPLY A SMALL AMOUNT TO AFFECTED AREA TWICE A DAY AS NEEDED  . [DISCONTINUED] BREO ELLIPTA 100-25 MCG/INH AEPB    No facility-administered encounter medications on file as of 05/15/2020.     Review of Systems  Review of Systems  No chest pain with exertion, no orthopnea or PND, denies any lower extremity swelling.  Comprehensive review of systems otherwise negative. Physical Exam  BP 124/70   Pulse 60   Temp 98.1 F (36.7 C) (Temporal)   Ht 6' 3" (1.905 m)   Wt 183 lb 1.6 oz (83.1 kg)   SpO2 100% Comment: on RA  BMI 22.89 kg/m   Wt Readings from  Last 5 Encounters:  05/15/20 183 lb 1.6 oz (83.1 kg)  04/05/20 184 lb (83.5 kg)  02/15/20 186 lb (84.4 kg)  01/29/20 187 lb (84.8 kg)  11/14/19 183 lb 12.8 oz (83.4 kg)    BMI Readings from Last 5 Encounters:  05/15/20 22.89 kg/m  04/05/20 23.00 kg/m  02/15/20 23.25 kg/m  01/29/20 23.69 kg/m  11/14/19 23.28 kg/m     Physical Exam General: Well-appearing, in no acute distress Eyes: EOMI, no icterus Neck: Supple, no JVP appreciated sitting upright Respiratory: Clear to auscultation bilaterally, normal work of breathing on room air Abdomen: Soft, bowel sounds present Extremities: Warm, no edema MSK: No synovitis, no joint effusions Neuro: Normal gait, no weakness Psych: Normal mood, full affect   Assessment & Plan:   DOMINQUE MARLIN is a 86 y.o. man with reported COPD with recent spirometry without fixed obstruction but with significant bronchodilator response whom we are seeing for follow up for evaluation of DOE.  DOE: PFTs are suggestive of asthma and this is likely a driver of symptoms.  Some of deconditioning given he admits to being less active.  Improved with ICS/LABA therapy but still present. Additionally, DLCO is severely reduced which is related to emphysema seen on CT but also concern for contribution of pulmonary HTN given elevated PA pressures seen on TTE in 2016. Repeat TTE 10/2019 with mildly elevated PASP, RV enlargement but normal RV function and size. Can discuss further work up in future but given age not sure much role for pursuing aggressively at this time.  Asthma: Better with ICS/LABA. Rare albuterol use.  Finds albuterol to be helpful.  Seen totally controlled.  Consider increasing to Trelegy in future.   Return in about 3 months (around 08/15/2020).   Nicholas Clam, MD 05/15/2020

## 2020-05-15 NOTE — Patient Instructions (Signed)
Nice to see you again  We had you resigned the paperwork for [place] assistance.  Once this gets approved please let me know.  At that point, we can try a new inhaler called Trelegy.  This may give you more air.  The Trelegy adds an extra medicine to the [facility].  I do not want to make changes until we have assurance of [employer]'s, helps pay for it.   Return to clinic for follow-up with Dr. Silas Flood in 3 months

## 2020-05-17 ENCOUNTER — Telehealth: Payer: Self-pay | Admitting: Pulmonary Disease

## 2020-05-17 NOTE — Telephone Encounter (Signed)
Called and spoke with the pt  He states needing refill on his albuterol inhaler  I do not see this on med list just updated on 05/15/20 He states Dr [name] was the original prescriber  Can we send rx for this Dr Silas Flood?  *original msg from front staff stated pt having SOB- I asked him about this and he states not any increased SOB, breathing unchanged since ov   Also, I called the [place] assistance to check on status of his forms  Was advised that there is still needed- proof of spending must be from 2022 and not 2021- so will not be approved if he has not spent $600 in 2022  Also- need upated [employer] card faxed to them   Will work on this after we get msg back on request for albuterol mdi rx

## 2020-05-24 NOTE — Telephone Encounter (Signed)
Reviewed patient's chart. It looks like the albuterol was D/C by his PCP back in June 2021.   MH, please advise. Thanks.

## 2020-05-26 MED ORDER — ALBUTEROL SULFATE HFA 108 (90 BASE) MCG/ACT IN AERS: 2.0000 | INHALATION_SPRAY | Freq: Four times a day (QID) | RESPIRATORY_TRACT | 6 refills | Status: DC | PRN

## 2020-05-26 NOTE — Telephone Encounter (Signed)
Albuterol script sent to local [facility].

## 2020-05-27 DIAGNOSIS — R21 Rash and other nonspecific skin eruption: Secondary | ICD-10-CM | POA: Diagnosis not present

## 2020-05-27 DIAGNOSIS — L309 Dermatitis, unspecified: Secondary | ICD-10-CM | POA: Diagnosis not present

## 2020-06-08 DIAGNOSIS — R972 Elevated prostate specific antigen [PSA]: Secondary | ICD-10-CM | POA: Diagnosis not present

## 2020-06-10 DIAGNOSIS — R972 Elevated prostate specific antigen [PSA]: Secondary | ICD-10-CM | POA: Diagnosis not present

## 2020-06-10 DIAGNOSIS — R310 Gross hematuria: Secondary | ICD-10-CM | POA: Diagnosis not present

## 2020-06-10 DIAGNOSIS — L291 Pruritus scroti: Secondary | ICD-10-CM | POA: Diagnosis not present

## 2020-06-10 DIAGNOSIS — N401 Enlarged prostate with lower urinary tract symptoms: Secondary | ICD-10-CM | POA: Diagnosis not present

## 2020-06-10 DIAGNOSIS — R351 Nocturia: Secondary | ICD-10-CM | POA: Diagnosis not present

## 2020-07-05 DIAGNOSIS — M7582 Other shoulder lesions, left shoulder: Secondary | ICD-10-CM | POA: Diagnosis not present

## 2020-07-09 DIAGNOSIS — L304 Erythema intertrigo: Secondary | ICD-10-CM | POA: Diagnosis not present

## 2020-07-22 DIAGNOSIS — D3110 Benign neoplasm of unspecified cornea: Secondary | ICD-10-CM | POA: Diagnosis not present

## 2020-07-22 DIAGNOSIS — N183 Chronic kidney disease, stage 3 unspecified: Secondary | ICD-10-CM | POA: Diagnosis not present

## 2020-07-22 DIAGNOSIS — D509 Iron deficiency anemia, unspecified: Secondary | ICD-10-CM | POA: Diagnosis not present

## 2020-07-29 DIAGNOSIS — N1832 Chronic kidney disease, stage 3b: Secondary | ICD-10-CM | POA: Diagnosis not present

## 2020-07-29 DIAGNOSIS — I129 Hypertensive chronic kidney disease with stage 1 through stage 4 chronic kidney disease, or unspecified chronic kidney disease: Secondary | ICD-10-CM | POA: Diagnosis not present

## 2020-07-29 DIAGNOSIS — D631 Anemia in chronic kidney disease: Secondary | ICD-10-CM | POA: Diagnosis not present

## 2020-07-29 DIAGNOSIS — R600 Localized edema: Secondary | ICD-10-CM | POA: Diagnosis not present

## 2020-07-29 DIAGNOSIS — N2581 Secondary hyperparathyroidism of renal origin: Secondary | ICD-10-CM | POA: Diagnosis not present

## 2020-07-29 DIAGNOSIS — D472 Monoclonal gammopathy: Secondary | ICD-10-CM | POA: Diagnosis not present

## 2020-08-05 DIAGNOSIS — I10 Essential (primary) hypertension: Secondary | ICD-10-CM | POA: Diagnosis not present

## 2020-08-05 DIAGNOSIS — E1121 Type 2 diabetes mellitus with diabetic nephropathy: Secondary | ICD-10-CM | POA: Diagnosis not present

## 2020-08-05 DIAGNOSIS — N401 Enlarged prostate with lower urinary tract symptoms: Secondary | ICD-10-CM | POA: Diagnosis not present

## 2020-08-05 DIAGNOSIS — N1832 Chronic kidney disease, stage 3b: Secondary | ICD-10-CM | POA: Diagnosis not present

## 2020-08-13 ENCOUNTER — Ambulatory Visit (INDEPENDENT_AMBULATORY_CARE_PROVIDER_SITE_OTHER): Payer: Medicare Other | Admitting: Podiatry

## 2020-08-13 ENCOUNTER — Encounter: Payer: Self-pay | Admitting: Podiatry

## 2020-08-13 ENCOUNTER — Other Ambulatory Visit: Payer: Self-pay

## 2020-08-13 DIAGNOSIS — M79674 Pain in right toe(s): Secondary | ICD-10-CM | POA: Diagnosis not present

## 2020-08-13 DIAGNOSIS — L84 Corns and callosities: Secondary | ICD-10-CM

## 2020-08-13 DIAGNOSIS — M79675 Pain in left toe(s): Secondary | ICD-10-CM

## 2020-08-13 DIAGNOSIS — B351 Tinea unguium: Secondary | ICD-10-CM | POA: Diagnosis not present

## 2020-08-13 DIAGNOSIS — E1142 Type 2 diabetes mellitus with diabetic polyneuropathy: Secondary | ICD-10-CM | POA: Diagnosis not present

## 2020-08-19 NOTE — Progress Notes (Signed)
Subjective: Nicholas Patton is a pleasant 86 y.o. male patient seen today with h/o diabetic neuropathy, painful interdigital corn left hallux, and painful thick toenails that are difficult to trim. Pain interferes with ambulation. Aggravating factors include wearing enclosed shoe gear. Pain is relieved with periodic professional debridement.  Patient states his blood glucose was 131 mg/dl today.   PCP is [name], MD. Last visit was: 04/05/2020.  Allergies  Allergen Reactions   Amlodipine Swelling   Cymbalta [Duloxetine Hcl]    Oxycodone     Objective: Physical Exam  General: Nicholas Patton is a pleasant 86 y.o. African American male, WD, WN in NAD. AAO x 3.   Vascular:  Capillary refill time to digits immediate b/l. Palpable pedal pulses b/l LE. Pedal hair absent. Lower extremity skin temperature gradient within normal limits. No pain with calf compression b/l. No edema noted b/l lower extremities.  Dermatological:  Pedal skin with normal turgor, texture and tone bilaterally. No open wounds bilaterally. No interdigital macerations bilaterally. Toenails 1-5 b/l elongated, discolored, dystrophic, thickened, crumbly with subungual debris and tenderness to dorsal palpation. Hyperkeratotic lesion(s) interdigitally lateral left hallux and medial left 2nd digit, L hallux, and R hallux.  No erythema, no edema, no drainage, no fluctuance.  Musculoskeletal:  Normal muscle strength 5/5 to all lower extremity muscle groups bilaterally. No pain crepitus or joint limitation noted with ROM b/l. Hallux valgus with bunion deformity noted b/l lower extremities. Hammertoe(s) noted to the 2-5 bilaterally.  Neurological:  Pt has subjective symptoms of neuropathy. Protective sensation intact 5/5 intact bilaterally with 10g monofilament b/l. Vibratory sensation intact b/l.  Assessment and Plan:  1. Pain due to onychomycosis of toenails of both feet   2. Corns and callosities   3. Diabetic peripheral  neuropathy associated with type 2 diabetes mellitus ([place])     = -Examined patient. -Patient to continue soft, supportive shoe gear daily. -Toenails 1-5 b/l were debrided in length and girth with sterile nail nippers and dremel without iatrogenic bleeding.  -Corn(s) L hallux and L 2nd toe pared utilizing sterile scalpel blade without complication or incident. Total number debrided=2. -Callus(es) L hallux and R hallux pared utilizing sterile scalpel blade without complication or incident. Total number debrided =2. -Patient to report any pedal injuries to medical professional immediately. -Patient/POA to call should there be question/concern in the interim.  Return in about 3 months (around 11/13/2020).  Marzetta Board, DPM

## 2020-08-20 DIAGNOSIS — L304 Erythema intertrigo: Secondary | ICD-10-CM | POA: Diagnosis not present

## 2020-08-27 DIAGNOSIS — N1832 Chronic kidney disease, stage 3b: Secondary | ICD-10-CM | POA: Diagnosis not present

## 2020-08-29 ENCOUNTER — Encounter: Payer: Self-pay | Admitting: Pulmonary Disease

## 2020-08-29 ENCOUNTER — Other Ambulatory Visit: Payer: Self-pay

## 2020-08-29 ENCOUNTER — Ambulatory Visit (INDEPENDENT_AMBULATORY_CARE_PROVIDER_SITE_OTHER): Payer: Medicare Other | Admitting: Pulmonary Disease

## 2020-08-29 VITALS — BP 130/80 | HR 72 | Ht 75.0 in | Wt 180.0 lb

## 2020-08-29 DIAGNOSIS — J4520 Mild intermittent asthma, uncomplicated: Secondary | ICD-10-CM

## 2020-08-29 DIAGNOSIS — R0600 Dyspnea, unspecified: Secondary | ICD-10-CM | POA: Diagnosis not present

## 2020-08-29 NOTE — Progress Notes (Signed)
Patient ID: Nicholas Patton, male    DOB: 1934/04/10, 86 y.o.   MRN: [id number]  Chief Complaint  Patient presents with   Follow-up    3 mo f/u. States he was not able to get qualified for patient assistance.     Referring provider: [name], MD  HPI:   Nicholas Patton is a 86 y.o. man with reported "COPD" with recent spirometry without fixed obstruction but with significant bronchodilator response whom we are seeing in follow up for asthma, DOE.  Breathing stable.  Not sure how much Memory Dance is helping.  May be some.  Still has dyspnea on exertion.  Using albuterol 2 times a week. More than last visit. Thinks heat is affecting his breathing. No oral steroid use or exacerbations since last visit.   PFTs reviewed.  HPI at initial visit: Has had some mild dyspnea exertion going on some time now.  At least months to years.  He has been told he has COPD in the past but has never had spirometry.  Shortness of breath worse walking longer distances or on inclines.  Rest resolve symptoms.  No real shortness of breath at rest.  He has hayfever, seasonal allergies regularly.  Will use medicines to treat this when bad.  No recurrent pneumonias in the past no real bad bouts of bronchitis.  No asthma or exertional limitation as a child.  No syncope or presyncope.  Reportedly has occasionally received prednisone for some breathing difficulties in the past.  Is been at least 2 years or more since last use.  Spirometry recently demonstrates ratio at 68 to 69% but given his age this is over 90% predicted and within normal limits.  Significant bronchodilator response in both FEV1 and FVC.  CT scan 05/2019 personally reviewed and interpreted as diffuse significant upper lobe predominant emphysema without other significant abnormality.  PMH: Diabetes, hypertension Surgical history: Cataracts, neck surgery/fusion next knee replacement, cholecystectomy Family history: No history of lung cancer Social history:  Former smoker, quit late 80s, lives in [place] / Pulmonary Flowsheets:   ACT:  No flowsheet data found.  MMRC: mMRC Dyspnea Scale mMRC Score  05/15/2020 0    Epworth:  No flowsheet data found.  Tests:   FENO:  No results found for: NITRICOXIDE  PFT: PFT Results Latest Ref Rng & Units 10/04/2019  FVC-Pre L 3.21  FVC-Predicted Pre % 78  FVC-Post L 3.67  FVC-Predicted Post % 90  Pre FEV1/FVC % % 68  Post FEV1/FCV % % 69  FEV1-Pre L 2.18  FEV1-Predicted Pre % 73  FEV1-Post L 2.55  DLCO uncorrected ml/min/mmHg 12.86  DLCO UNC% % 49  DLCO corrected ml/min/mmHg 12.94  DLCO COR %Predicted % 49  DLVA Predicted % 50  TLC L 7.34  TLC % Predicted % 95  RV % Predicted % 120    WALK:  No flowsheet data found.  Imaging: No results found.  Lab Results:  CBC    Component Value Date/Time   WBC 3.9 (L) 10/25/2019 1052   WBC 3.5 (L) 10/04/2019 1115   RBC 4.83 10/25/2019 1052   HGB 14.4 10/25/2019 1052   HCT 44.0 10/25/2019 1052   PLT 151 10/25/2019 1052   MCV 91.1 10/25/2019 1052   MCH 29.8 10/25/2019 1052   MCHC 32.7 10/25/2019 1052   RDW 13.9 10/25/2019 1052   LYMPHSABS 0.9 10/25/2019 1052   MONOABS 0.4 10/25/2019 1052   EOSABS 0.2 10/25/2019 1052   BASOSABS 0.0 10/25/2019  1052    BMET    Component Value Date/Time   NA 140 04/05/2020 1118   K 4.2 04/05/2020 1118   CL 106 04/05/2020 1118   CO2 28 04/05/2020 1118   GLUCOSE 147 (H) 04/05/2020 1118   BUN 26 (H) 04/05/2020 1118   CREATININE 1.72 (H) 04/05/2020 1118   CREATININE 1.76 (H) 10/25/2019 1052   CREATININE 1.68 (H) 10/04/2019 1115   CALCIUM 9.8 04/05/2020 1118   GFRNONAA 35 (L) 10/25/2019 1052   GFRNONAA 37 (L) 10/04/2019 1115   GFRAA 41 (L) 10/25/2019 1052   GFRAA 43 (L) 10/04/2019 1115    BNP    Component Value Date/Time   BNP 48.5 05/18/2019 1918    ProBNP No results found for: PROBNP  Specialty Problems       Pulmonary Problems   Allergic rhinitis    Qualifier:  Diagnosis of  By: [name] MD, Hunt Oris        Acute upper respiratory infection   Dyspnea   Left-sided nosebleed   COPD (chronic obstructive pulmonary disease) (HCC)    Allergies  Allergen Reactions   Amlodipine Swelling   Cymbalta [Duloxetine Hcl]    Oxycodone     Immunization History  Administered Date(s) Administered   Fluad Quad(high Dose 65+) 11/09/2018, 11/30/2019   Influenza Whole 11/21/2015   Influenza, High Dose Seasonal PF 01/24/2013, 12/01/2016, 11/22/2017   Influenza,inj,Quad PF,6+ Mos 12/08/2013   Influenza-Unspecified 02/08/2015   PFIZER(Purple Top)SARS-COV-2 Vaccination 04/03/2019, 04/10/2019, 12/23/2019   Pneumococcal Conjugate-13 03/22/2013   Pneumococcal Polysaccharide-23 01/10/2016   Td 10/05/2014    Past Medical History:  Diagnosis Date   Anemia    Arthritis    Cataract    REMOVED BILATERAL   Colon polyps    DIABETES MELLITUS, TYPE II    takes Metformin and Glimepiride daily   Diverticulosis    GERD (gastroesophageal reflux disease)    takes Protonix daily   History of colon polyps    HYPERLIPIDEMIA    takes Atorvastatin daily   HYPERTENSION    takes Amlodipine and Hyzaar daily   Ileus following gastrointestinal surgery ([place]) 07/22/2012   Joint pain    Peripheral neuropathy    Peripheral neuropathy    PERIPHERAL NEUROPATHY, FEET 10/06/2007   Renal disorder    VITAMIN B12 DEFICIENCY 09/06/2008   VITAMIN D DEFICIENCY 01/16/2010   takes Vitamin D daily    Tobacco History: Social History   Tobacco Use  Smoking Status Former   Packs/day: 0.00   Pack years: 0.00   Types: Cigarettes  Smokeless Tobacco Never  Tobacco Comments   quit smoking around 1990   Counseling given: Not Answered Tobacco comments: quit smoking around [place] to not smoke  Outpatient Encounter Medications as of 08/29/2020  Medication Sig   albuterol (VENTOLIN HFA) 108 (90 Base) MCG/ACT inhaler Inhale 2 puffs into the lungs every 6 (six) hours as needed for  wheezing or shortness of breath.   aspirin 81 MG EC tablet Take 81 mg by mouth daily.   atorvastatin (LIPITOR) 40 MG tablet TAKE 1 TABLET BY MOUTH  DAILY   carvedilol (COREG) 12.5 MG tablet TAKE 1 TABLET BY MOUTH  TWICE DAILY WITH MEALS   Cholecalciferol (VITAMIN D3) 1000 UNITS CAPS Take 1 capsule (1,000 Units total) by mouth daily.   finasteride (PROSCAR) 5 MG tablet Take 5 mg by mouth daily.   fluticasone (CUTIVATE) 0.005 % ointment SMARTSIG:1 Sparingly Topical Daily   fluticasone furoate-vilanterol (BREO ELLIPTA) 200-25 MCG/INH AEPB Inhale  1 puff into the lungs daily.   glimepiride (AMARYL) 2 MG tablet TAKE 2 TABLETS BY MOUTH  DAILY BEFORE BREAKFAST   glucose blood (ONE TOUCH ULTRA TEST) test strip Use as instructed E11.9   hydrALAZINE (APRESOLINE) 10 MG tablet Take 10 mg by mouth 2 (two) times daily.   hydrocortisone 2.5 % ointment Apply topically 2 (two) times daily.   hydrOXYzine (ATARAX/VISTARIL) 10 MG tablet    ketoconazole (NIZORAL) 2 % cream Apply 1 application topically daily.   lidocaine (LIDODERM) 5 % Place 1 patch onto the skin daily. Remove & Discard patch within 12 hours or as directed by MD   losartan (COZAAR) 100 MG tablet TAKE 1 TABLET BY MOUTH  DAILY   Multiple Vitamins-Minerals (MULTIVITAMIN PO) Take 1 tablet by mouth daily.   NONFORMULARY OR COMPOUNDED ITEM Peripheral Neuropathy Cream: Bupivacaine 1%, Doxepin 3%, Gabapentin 6%, Pentoxifylline 3%, Topiramate 1% Order faxed to [facility]   nystatin ointment (MYCOSTATIN) Apply topically daily.   NYSTATIN powder SMARTSIG:Sparingly Topical 2-3 Times Daily   pantoprazole (PROTONIX) 40 MG tablet TAKE 1 TABLET BY MOUTH  DAILY   tamsulosin (FLOMAX) 0.4 MG CAPS capsule TAKE 1 CAPSULE BY MOUTH  DAILY   triamcinolone (KENALOG) 0.1 % APPLY A SMALL AMOUNT TO AFFECTED AREA TWICE A DAY AS NEEDED   furosemide (LASIX) 20 MG tablet Take 20 mg by mouth daily.   hydrALAZINE (APRESOLINE) 25 MG tablet Take 25 mg by mouth 2 (two)  times daily.   No facility-administered encounter medications on file as of 08/29/2020.     Review of Systems  Review of Systems  N/a Physical Exam  BP 130/80   Pulse 72   Ht _0  (1.905 m)   Wt 180 lb (81.6 kg)   SpO2 97%   BMI 22.50 kg/m   Wt Readings from Last 5 Encounters:  08/29/20 180 lb (81.6 kg)  05/15/20 183 lb 1.6 oz (83.1 kg)  04/05/20 184 lb (83.5 kg)  02/15/20 186 lb (84.4 kg)  01/29/20 187 lb (84.8 kg)    BMI Readings from Last 5 Encounters:  08/29/20 22.50 kg/m  05/15/20 22.89 kg/m  04/05/20 23.00 kg/m  02/15/20 23.25 kg/m  01/29/20 23.69 kg/m     Physical Exam General: Well-appearing, in no acute distress Eyes: EOMI, no icterus Neck: Supple, no JVP appreciated sitting upright Respiratory: Clear to auscultation bilaterally, normal work of breathing on room air Abdomen: Soft, bowel sounds present Extremities: Warm, no edema MSK: No synovitis, no joint effusions Neuro: Normal gait, no weakness Psych: Normal mood, full affect   Assessment & Plan:   PURL CLAYTOR is a 86 y.o. man with reported COPD with recent spirometry without fixed obstruction but with significant bronchodilator response whom we are seeing for follow up for evaluation of DOE.  DOE: PFTs are suggestive of asthma and this is likely a driver of symptoms.  Some of deconditioning given he admits to being less active.  Improved with ICS/LABA therapy but still present. Additionally, DLCO is severely reduced which is related to emphysema seen on CT but also concern for contribution of pulmonary HTN given elevated PA pressures seen on TTE in 2016. Repeat TTE 10/2019 with mildly elevated PASP, RV enlargement but normal RV function and size. Can discuss further work up in future but given age not sure much role for pursuing aggressively at this time.  Asthma: Better with ICS/LABA. Albuterol use has picked up.  Finds albuterol to be helpful.  Maybe related to heat Consider increasing  to  Trelegy in future if not improving or worsens.   Return in about 6 months (around 02/28/2021).   Lanier Clam, MD 08/29/2020

## 2020-08-29 NOTE — Patient Instructions (Addendum)
Nice to see you!  Call [facility] and tell them to stop sending automatic refills of Breo.  Continue Breo. Once you get down to 1 inhaler call us and we can prescribe a stronger inhaler called Trelegy.  If in a few months your albuterol use goes down and feeling fine then we can stick with Breo.   Return in 6 months or sooner as needed

## 2020-09-06 ENCOUNTER — Other Ambulatory Visit: Payer: Self-pay

## 2020-09-06 ENCOUNTER — Ambulatory Visit (HOSPITAL_COMMUNITY)
Admission: EM | Admit: 2020-09-06 | Discharge: 2020-09-06 | Disposition: A | Discharge status: Home / Self Care | Payer: Medicare Other | Attending: Internal Medicine | Admitting: Internal Medicine

## 2020-09-06 ENCOUNTER — Encounter (HOSPITAL_COMMUNITY): Payer: Self-pay | Admitting: Emergency Medicine

## 2020-09-06 ENCOUNTER — Telehealth: Payer: Self-pay | Admitting: Internal Medicine

## 2020-09-06 DIAGNOSIS — Z7984 Long term (current) use of oral hypoglycemic drugs: Secondary | ICD-10-CM | POA: Diagnosis not present

## 2020-09-06 DIAGNOSIS — E1165 Type 2 diabetes mellitus with hyperglycemia: Secondary | ICD-10-CM

## 2020-09-06 DIAGNOSIS — R7309 Other abnormal glucose: Secondary | ICD-10-CM

## 2020-09-06 LAB — CBG MONITORING, ED: Glucose-Capillary: 194 mg/dL — ABNORMAL HIGH (ref 70–99)

## 2020-09-06 NOTE — ED Provider Notes (Signed)
[facility]    CSN: [id number] Arrival date & time: 09/06/20  0905      History   Chief Complaint Chief Complaint  Patient presents with   Hyperglycemia    Not symptomatic- BG was 168 this am - high for him    HPI Nicholas Patton is a 86 y.o. male.   HPI  Hyperglycemia: Pt reports that over the past week his blood sugars have been elevated. His fasting blood sugar was 168 this morning which is very high for him he reports. He states that he recently was switched onto finasteride a few weeks ago. He also notes that he was using a sugar alternative in his coffee but has switched to using about half a packet of sugar. He is feeling well without cough, dysuria, fevers, night sweats. He has follow up with his PCP for his DM2 on the 29th of this month for his next A1C.   Past Medical History:  Diagnosis Date   Anemia    Arthritis    Cataract    REMOVED BILATERAL   Colon polyps    DIABETES MELLITUS, TYPE II    takes Metformin and Glimepiride daily   Diverticulosis    GERD (gastroesophageal reflux disease)    takes Protonix daily   History of colon polyps    HYPERLIPIDEMIA    takes Atorvastatin daily   HYPERTENSION    takes Amlodipine and Hyzaar daily   Ileus following gastrointestinal surgery ([place]) 07/22/2012   Joint pain    Peripheral neuropathy    Peripheral neuropathy    PERIPHERAL NEUROPATHY, FEET 10/06/2007   Renal disorder    VITAMIN B12 DEFICIENCY 09/06/2008   VITAMIN D DEFICIENCY 01/16/2010   takes Vitamin D daily    Patient Active Problem List   Diagnosis Date Noted   Scrotal itching 04/06/2020   Aortic atherosclerosis ([place]) 04/05/2020   Gross hematuria 04/05/2020   Pruritus 02/04/2020   Right ankle pain 02/04/2020   Rash 10/08/2019   COPD (chronic obstructive pulmonary disease) ([place]) 05/19/2019   Abnormal TSH 10/04/2018   Headache 10/04/2018   PHN (postherpetic neuralgia) 04/04/2018   Shingles 03/11/2018   Acute gout due to renal impairment  involving toe of left foot 02/02/2018   Eustachian tube dysfunction, left 12/06/2017   MGUS (monoclonal gammopathy of unknown significance) 10/28/2017   Elevated blood protein 10/08/2017   Peripheral edema 09/17/2017   Anemia, iron deficiency 04/10/2016   Left-sided nosebleed 01/13/2016   Hematochezia 11/21/2015   Peripheral neuropathic pain 10/25/2015   Low back pain 10/08/2015   CKD (chronic kidney disease) stage 3, GFR 30-59 ml/min (HCC) 10/08/2015   S/P cervical spinal fusion 06/27/2015   Increased prostate specific antigen (PSA) velocity 04/09/2015   Chest pain 08/14/2014   Dyspnea 08/14/2014   Urinary frequency 06/19/2014   Overactive bladder 09/27/2013   Onychomycosis 08/08/2013   Pain in lower limb 05/15/2013   Diabetic peripheral neuropathy ([place]) 05/15/2013   Other hammer toe (acquired) 05/15/2013   HAV (hallux abducto valgus) 05/15/2013   Blood in mouth of unknown source 02/09/2013   Vertigo 12/29/2012   Acute upper respiratory infection 12/29/2012   Erectile dysfunction 09/22/2012   Left ear hearing loss 09/13/2012   S/P laparoscopic cholecystectomy 08/03/2012   Neck pain on left side 04/04/2012   Trapezoid ligament sprain 04/04/2012   Bladder neck obstruction 10/06/2011   Cervical radiculitis 09/26/2010   Preventative health care 09/14/2010   Vitamin D deficiency 01/16/2010   PERS HX TOBACCO USE PRESENTING  HAZARDS HEALTH 09/17/2009   B12 deficiency 09/06/2008   INSOMNIA 06/21/2008   LEG PAIN, BILATERAL 10/06/2007   Diabetes ([place]) 01/24/2007   Hyperlipidemia 01/24/2007   Allergic rhinitis 01/24/2007   BPH (benign prostatic hypertrophy) 01/24/2007   Essential hypertension 10/05/2006   GERD 10/05/2006    Past Surgical History:  Procedure Laterality Date   CATARACT EXTRACTION     both eyes   CHOLECYSTECTOMY N/A 07/18/2012   Procedure: LAPAROSCOPIC CHOLECYSTECTOMY WITH INTRAOPERATIVE CHOLANGIOGRAM;  Surgeon: Zenovia Jarred, MD;  Location: [facility];  Service:  General;  Laterality: N/A;   COLONOSCOPY     POLYPECTOMY     POSTERIOR CERVICAL FUSION/FORAMINOTOMY N/A 06/27/2015   Procedure: Posterior Cervical Fusion with lateral mass fixation Cervical four to cervical seven;  Surgeon: Eustace Moore, MD;  Location: [facility];  Service: Neurosurgery;  Laterality: N/A;   TOTAL KNEE ARTHROPLASTY  2001   Left   TURP VAPORIZATION         Home Medications    Prior to Admission medications   Medication Sig Start Date End Date Taking? Authorizing Provider  albuterol (VENTOLIN HFA) 108 (90 Base) MCG/ACT inhaler Inhale 2 puffs into the lungs every 6 (six) hours as needed for wheezing or shortness of breath. 05/26/20   Hunsucker, Bonna Gains, MD  aspirin 81 MG EC tablet Take 81 mg by mouth daily.    [provider]  atorvastatin (LIPITOR) 40 MG tablet TAKE 1 TABLET BY MOUTH  DAILY 11/16/19   [name], MD  carvedilol (COREG) 12.5 MG tablet TAKE 1 TABLET BY MOUTH  TWICE DAILY WITH MEALS 02/26/20   [name], MD  Cholecalciferol (VITAMIN D3) 1000 UNITS CAPS Take 1 capsule (1,000 Units total) by mouth daily. 03/22/13   [name], MD  finasteride (PROSCAR) 5 MG tablet Take 5 mg by mouth daily. 07/11/20   [provider]  fluticasone (CUTIVATE) 0.005 % ointment SMARTSIG:1 Sparingly Topical Daily 07/09/20   [provider]  fluticasone furoate-vilanterol (BREO ELLIPTA) 200-25 MCG/INH AEPB Inhale 1 puff into the lungs daily.    [provider]  furosemide (LASIX) 20 MG tablet Take 20 mg by mouth daily. 08/13/20   [provider]  glimepiride (AMARYL) 2 MG tablet TAKE 2 TABLETS BY MOUTH  DAILY BEFORE BREAKFAST 02/26/20   [name], MD  glucose blood (ONE TOUCH ULTRA TEST) test strip Use as instructed E11.9 09/17/17   [name], MD  hydrALAZINE (APRESOLINE) 10 MG tablet Take 10 mg by mouth 2 (two) times daily. 07/29/20   [provider]  hydrALAZINE (APRESOLINE) 25 MG tablet Take 25 mg by mouth 2 (two) times  daily. 08/13/20   [provider]  hydrocortisone 2.5 % ointment Apply topically 2 (two) times daily. 05/28/20   [provider]  hydrOXYzine (ATARAX/VISTARIL) 10 MG tablet  04/29/20   [provider]  ketoconazole (NIZORAL) 2 % cream Apply 1 application topically daily. 02/27/20   [name], MD  lidocaine (LIDODERM) 5 % Place 1 patch onto the skin daily. Remove & Discard patch within 12 hours or as directed by MD 04/04/18   [name], MD  losartan (COZAAR) 100 MG tablet TAKE 1 TABLET BY MOUTH  DAILY 02/26/20   [name], MD  Multiple Vitamins-Minerals (MULTIVITAMIN PO) Take 1 tablet by mouth daily.    [provider]  NONFORMULARY OR COMPOUNDED ITEM Peripheral Neuropathy Cream: Bupivacaine 1%, Doxepin 3%, Gabapentin 6%, Pentoxifylline 3%, Topiramate 1% Order  faxed to [facility] 04/21/19   [place], Stephani Police, DPM  nystatin ointment (MYCOSTATIN) Apply topically daily. 07/09/20   [provider]  NYSTATIN powder SMARTSIG:Sparingly Topical 2-3 Times Daily 04/08/20   [provider]  pantoprazole (PROTONIX) 40 MG tablet TAKE 1 TABLET BY MOUTH  DAILY 11/16/19   [name], MD  tamsulosin (FLOMAX) 0.4 MG CAPS capsule TAKE 1 CAPSULE BY MOUTH  DAILY 11/16/19   [name], MD  triamcinolone (KENALOG) 0.1 % APPLY A SMALL AMOUNT TO AFFECTED AREA TWICE A DAY AS NEEDED 04/29/20   [provider]    Family History Family History  Problem Relation Age of Onset   Diabetes Mellitus II Mother    Lung cancer Brother    Colon cancer Neg Hx    Esophageal cancer Neg Hx    Rectal cancer Neg Hx    Stomach cancer Neg Hx     Social History Social History   Tobacco Use   Smoking status: Former    Packs/day: 0.00    Pack years: 0.00    Types: Cigarettes   Smokeless tobacco: Never   Tobacco comments:    quit smoking around 1990  Vaping Use   Vaping Use: Never used  Substance Use Topics   Alcohol use: No    Alcohol/week: 0.0  standard drinks   Drug use: No     Allergies   Amlodipine, Cymbalta [duloxetine hcl], and Oxycodone   Review of Systems Review of Systems  As stated above in HPI Physical Exam Triage Vital Signs ED Triage Vitals  Enc Vitals Group     BP 09/06/20 0940 (!) 165/66     Pulse Rate 09/06/20 0940 (!) 56     Resp 09/06/20 0940 16     Temp 09/06/20 0940 98.2 F (36.8 C)     Temp Source 09/06/20 0940 Oral     SpO2 09/06/20 0940 97 %     Weight --      Height --      Head Circumference --      Peak Flow --      Pain Score 09/06/20 0941 0     Pain Loc --      Pain Edu? --      Excl. in [place]? --    No data found.  Updated Vital Signs BP (!) 165/66   Pulse (!) 56   Temp 98.2 F (36.8 C) (Oral)   Resp 16   SpO2 97%    Physical Exam Vitals and nursing note reviewed.  Constitutional:      General: He is not in acute distress.    Appearance: Normal appearance. He is not ill-appearing, toxic-appearing or diaphoretic.  Eyes:     Extraocular Movements: Extraocular movements intact.     Pupils: Pupils are equal, round, and reactive to light.  Neck:     Vascular: No carotid bruit.  Cardiovascular:     Rate and Rhythm: Normal rate and regular rhythm.     Pulses: Normal pulses.     Heart sounds: Normal heart sounds.  Pulmonary:     Effort: Pulmonary effort is normal.     Breath sounds: Normal breath sounds.  Musculoskeletal:     Cervical back: Neck supple.  Skin:    General: Skin is warm.  Neurological:     Mental Status: He is alert.  Psychiatric:        Mood and Affect: Mood normal.        Behavior:  Behavior normal.        Thought Content: Thought content normal.        Judgment: Judgment normal.     UC Treatments / Results  Labs (all labs ordered are listed, but only abnormal results are displayed) Labs Reviewed  CBG MONITORING, ED - Abnormal; Notable for the following components:      Result Value   Glucose-Capillary 194 (*)    All other components within  normal limits    EKG   Radiology No results found.  Procedures Procedures (including critical care time)  Medications Ordered in UC Medications - No data to display  Initial Impression / Assessment and Plan / UC Course  I have reviewed the triage vital signs and the nursing notes.  Pertinent labs & imaging results that were available during my care of the patient were reviewed by me and considered in my medical decision making (see chart for details).     New.  I discussed with patient that the furosemide medication can cause hyperglycemia per up-to-date.  I want him to make some dietary adjustments in the meantime while he awaits for his follow-up and follow-up A1c.  I do not think that at this point patient needs any drastic medication adjustments.  If his A1c is significantly higher than he should discuss with his specialist and his PCP about any medication adjustments that need to be made.  We did discuss red flag signs and symptoms and red flag values for glucose. Final Clinical Impressions(s) / UC Diagnoses   Final diagnoses:  None   Discharge Instructions   None    ED Prescriptions   None    PDMP not reviewed this encounter.   Hughie Closs, [place] 09/06/20 1118

## 2020-09-06 NOTE — Telephone Encounter (Signed)
.   FYI..  Patient states that his sugar has been high lately. His last reading was this morning which was 169. Offered an appointment at another [facility] but patient declined. Preferred to go to urgent care.

## 2020-09-06 NOTE — ED Triage Notes (Signed)
PT here for hyperglycemia. It's been going up over the last week. His fasting glucose was 168 this morning - this is the highest it's been. He is asymptomatic.

## 2020-09-08 ENCOUNTER — Emergency Department (HOSPITAL_COMMUNITY): Payer: Medicare Other

## 2020-09-08 ENCOUNTER — Other Ambulatory Visit: Payer: Self-pay

## 2020-09-08 ENCOUNTER — Encounter (HOSPITAL_COMMUNITY): Payer: Self-pay | Admitting: Emergency Medicine

## 2020-09-08 ENCOUNTER — Emergency Department (HOSPITAL_COMMUNITY)
Admission: EM | Admit: 2020-09-08 | Discharge: 2020-09-09 | Disposition: A | Discharge status: Home / Self Care | Payer: Medicare Other | Attending: Emergency Medicine | Admitting: Emergency Medicine

## 2020-09-08 DIAGNOSIS — J449 Chronic obstructive pulmonary disease, unspecified: Secondary | ICD-10-CM | POA: Diagnosis not present

## 2020-09-08 DIAGNOSIS — E1140 Type 2 diabetes mellitus with diabetic neuropathy, unspecified: Secondary | ICD-10-CM | POA: Insufficient documentation

## 2020-09-08 DIAGNOSIS — Z7982 Long term (current) use of aspirin: Secondary | ICD-10-CM | POA: Insufficient documentation

## 2020-09-08 DIAGNOSIS — Z7984 Long term (current) use of oral hypoglycemic drugs: Secondary | ICD-10-CM | POA: Diagnosis not present

## 2020-09-08 DIAGNOSIS — Z87891 Personal history of nicotine dependence: Secondary | ICD-10-CM | POA: Diagnosis not present

## 2020-09-08 DIAGNOSIS — R200 Anesthesia of skin: Secondary | ICD-10-CM | POA: Insufficient documentation

## 2020-09-08 DIAGNOSIS — I129 Hypertensive chronic kidney disease with stage 1 through stage 4 chronic kidney disease, or unspecified chronic kidney disease: Secondary | ICD-10-CM | POA: Insufficient documentation

## 2020-09-08 DIAGNOSIS — N183 Chronic kidney disease, stage 3 unspecified: Secondary | ICD-10-CM | POA: Diagnosis not present

## 2020-09-08 DIAGNOSIS — E1122 Type 2 diabetes mellitus with diabetic chronic kidney disease: Secondary | ICD-10-CM | POA: Diagnosis not present

## 2020-09-08 DIAGNOSIS — Z96652 Presence of left artificial knee joint: Secondary | ICD-10-CM | POA: Insufficient documentation

## 2020-09-08 DIAGNOSIS — R42 Dizziness and giddiness: Secondary | ICD-10-CM

## 2020-09-08 DIAGNOSIS — Z79899 Other long term (current) drug therapy: Secondary | ICD-10-CM | POA: Insufficient documentation

## 2020-09-08 LAB — CBC WITH DIFFERENTIAL/PLATELET
Abs Immature Granulocytes: 0.01 10*3/uL (ref 0.00–0.07)
Basophils Absolute: 0 10*3/uL (ref 0.0–0.1)
Basophils Relative: 0 %
Eosinophils Absolute: 0.1 10*3/uL (ref 0.0–0.5)
Eosinophils Relative: 2 %
HCT: 42.7 % (ref 39.0–52.0)
Hemoglobin: 13.9 g/dL (ref 13.0–17.0)
Immature Granulocytes: 0 %
Lymphocytes Relative: 18 %
Lymphs Abs: 0.8 10*3/uL (ref 0.7–4.0)
MCH: 30.1 pg (ref 26.0–34.0)
MCHC: 32.6 g/dL (ref 30.0–36.0)
MCV: 92.4 fL (ref 80.0–100.0)
Monocytes Absolute: 0.5 10*3/uL (ref 0.1–1.0)
Monocytes Relative: 11 %
Neutro Abs: 3.1 10*3/uL (ref 1.7–7.7)
Neutrophils Relative %: 69 %
Platelets: 155 10*3/uL (ref 150–400)
RBC: 4.62 MIL/uL (ref 4.22–5.81)
RDW: 14 % (ref 11.5–15.5)
WBC: 4.5 10*3/uL (ref 4.0–10.5)
nRBC: 0 % (ref 0.0–0.2)

## 2020-09-08 LAB — BASIC METABOLIC PANEL
Anion gap: 10 (ref 5–15)
BUN: 37 mg/dL — ABNORMAL HIGH (ref 8–23)
CO2: 22 mmol/L (ref 22–32)
Calcium: 9.2 mg/dL (ref 8.9–10.3)
Chloride: 105 mmol/L (ref 98–111)
Creatinine, Ser: 1.88 mg/dL — ABNORMAL HIGH (ref 0.61–1.24)
GFR, Estimated: 35 mL/min — ABNORMAL LOW (ref >60)
Glucose, Bld: 195 mg/dL — ABNORMAL HIGH (ref 70–99)
Potassium: 5 mmol/L (ref 3.5–5.1)
Sodium: 137 mmol/L (ref 135–145)

## 2020-09-08 LAB — POTASSIUM: Potassium: 3.2 mmol/L — ABNORMAL LOW (ref 3.5–5.1)

## 2020-09-08 LAB — CBG MONITORING, ED: Glucose-Capillary: 205 mg/dL — ABNORMAL HIGH (ref 70–99)

## 2020-09-08 MED ORDER — MECLIZINE HCL 25 MG PO TABS: 25.0000 mg | ORAL_TABLET | Freq: Three times a day (TID) | ORAL | 0 refills | Status: DC | PRN

## 2020-09-08 MED ORDER — MECLIZINE HCL 25 MG PO TABS
25.0000 mg | ORAL_TABLET | Freq: Once | ORAL | Status: AC
  Administered 2020-09-08: 25 mg via ORAL
  Filled 2020-09-08: qty 1

## 2020-09-08 MED ORDER — SODIUM CHLORIDE 0.9 % IV BOLUS
500.0000 mL | Freq: Once | INTRAVENOUS | Status: AC
  Administered 2020-09-08: 500 mL via INTRAVENOUS

## 2020-09-08 NOTE — ED Notes (Signed)
Attempted to call MRI x2 to update them on pt transport requirements. No answer

## 2020-09-08 NOTE — ED Triage Notes (Signed)
Pt reports dizziness since about 3p. Denies pain. Also reports some chills and difficulty ambulating. States that his sugar and BP have been running "high" lately. No LOC. No speech changes or obvious facial droop in triage. A&Ox4.

## 2020-09-08 NOTE — ED Notes (Signed)
Pt ambulated approx 49f with stand by assist. No dizziness noted. Pt states he feels much better than when he came in

## 2020-09-08 NOTE — ED Provider Notes (Signed)
[facility] DEPT Provider Note   CSN: [id number] Arrival date & time: 09/08/20  1859     History Chief Complaint  Patient presents with   Dizziness    Nicholas Patton is a 86 y.o. male.  HPI 86 year old male presents with dizziness.  Started around 230 or 3 PM this afternoon.  Feels like he is unable to stand or walk.  He denies feeling like he is going to pass out.  There is no headache or acute vision changes.  No focal weakness though he has some chronic leg numbness bilaterally.  No chest pain or shortness of breath or fever.  Sitting still makes it better but it is significantly worse when he tries to stand up or sit up.  He was not doing anything in particular when it started. Some ear pressure but no ear ringing.  Past Medical History:  Diagnosis Date   Anemia    Arthritis    Cataract    REMOVED BILATERAL   Colon polyps    DIABETES MELLITUS, TYPE II    takes Metformin and Glimepiride daily   Diverticulosis    GERD (gastroesophageal reflux disease)    takes Protonix daily   History of colon polyps    HYPERLIPIDEMIA    takes Atorvastatin daily   HYPERTENSION    takes Amlodipine and Hyzaar daily   Ileus following gastrointestinal surgery ([place]) 07/22/2012   Joint pain    Peripheral neuropathy    Peripheral neuropathy    PERIPHERAL NEUROPATHY, FEET 10/06/2007   Renal disorder    VITAMIN B12 DEFICIENCY 09/06/2008   VITAMIN D DEFICIENCY 01/16/2010   takes Vitamin D daily    Patient Active Problem List   Diagnosis Date Noted   Scrotal itching 04/06/2020   Aortic atherosclerosis ([place]) 04/05/2020   Gross hematuria 04/05/2020   Pruritus 02/04/2020   Right ankle pain 02/04/2020   Rash 10/08/2019   COPD (chronic obstructive pulmonary disease) ([place]) 05/19/2019   Abnormal TSH 10/04/2018   Headache 10/04/2018   PHN (postherpetic neuralgia) 04/04/2018   Shingles 03/11/2018   Acute gout due to renal impairment involving toe of left foot  02/02/2018   Eustachian tube dysfunction, left 12/06/2017   MGUS (monoclonal gammopathy of unknown significance) 10/28/2017   Elevated blood protein 10/08/2017   Peripheral edema 09/17/2017   Anemia, iron deficiency 04/10/2016   Left-sided nosebleed 01/13/2016   Hematochezia 11/21/2015   Peripheral neuropathic pain 10/25/2015   Low back pain 10/08/2015   CKD (chronic kidney disease) stage 3, GFR 30-59 ml/min (HCC) 10/08/2015   S/P cervical spinal fusion 06/27/2015   Increased prostate specific antigen (PSA) velocity 04/09/2015   Chest pain 08/14/2014   Dyspnea 08/14/2014   Urinary frequency 06/19/2014   Overactive bladder 09/27/2013   Onychomycosis 08/08/2013   Pain in lower limb 05/15/2013   Diabetic peripheral neuropathy ([place]) 05/15/2013   Other hammer toe (acquired) 05/15/2013   HAV (hallux abducto valgus) 05/15/2013   Blood in mouth of unknown source 02/09/2013   Vertigo 12/29/2012   Acute upper respiratory infection 12/29/2012   Erectile dysfunction 09/22/2012   Left ear hearing loss 09/13/2012   S/P laparoscopic cholecystectomy 08/03/2012   Neck pain on left side 04/04/2012   Trapezoid ligament sprain 04/04/2012   Bladder neck obstruction 10/06/2011   Cervical radiculitis 09/26/2010   Preventative health care 09/14/2010   Vitamin D deficiency 01/16/2010   PERS HX TOBACCO USE PRESENTING HAZARDS HEALTH 09/17/2009   B12 deficiency 09/06/2008   INSOMNIA 06/21/2008  LEG PAIN, BILATERAL 10/06/2007   Diabetes ([place]) 01/24/2007   Hyperlipidemia 01/24/2007   Allergic rhinitis 01/24/2007   BPH (benign prostatic hypertrophy) 01/24/2007   Essential hypertension 10/05/2006   GERD 10/05/2006    Past Surgical History:  Procedure Laterality Date   CATARACT EXTRACTION     both eyes   CHOLECYSTECTOMY N/A 07/18/2012   Procedure: LAPAROSCOPIC CHOLECYSTECTOMY WITH INTRAOPERATIVE CHOLANGIOGRAM;  Surgeon: Zenovia Jarred, MD;  Location: [facility];  Service: General;  Laterality: N/A;    COLONOSCOPY     POLYPECTOMY     POSTERIOR CERVICAL FUSION/FORAMINOTOMY N/A 06/27/2015   Procedure: Posterior Cervical Fusion with lateral mass fixation Cervical four to cervical seven;  Surgeon: Eustace Moore, MD;  Location: [facility];  Service: Neurosurgery;  Laterality: N/A;   TOTAL KNEE ARTHROPLASTY  2001   Left   TURP VAPORIZATION         Family History  Problem Relation Age of Onset   Diabetes Mellitus II Mother    Lung cancer Brother    Colon cancer Neg Hx    Esophageal cancer Neg Hx    Rectal cancer Neg Hx    Stomach cancer Neg Hx     Social History   Tobacco Use   Smoking status: Former    Packs/day: 0.00    Pack years: 0.00    Types: Cigarettes   Smokeless tobacco: Never   Tobacco comments:    quit smoking around 1990  Vaping Use   Vaping Use: Never used  Substance Use Topics   Alcohol use: No    Alcohol/week: 0.0 standard drinks   Drug use: No    Home Medications Prior to Admission medications   Medication Sig Start Date End Date Taking? Authorizing Provider  albuterol (VENTOLIN HFA) 108 (90 Base) MCG/ACT inhaler Inhale 2 puffs into the lungs every 6 (six) hours as needed for wheezing or shortness of breath. 05/26/20   Hunsucker, Bonna Gains, MD  aspirin 81 MG EC tablet Take 81 mg by mouth daily.    [provider]  atorvastatin (LIPITOR) 40 MG tablet TAKE 1 TABLET BY MOUTH  DAILY 11/16/19   [name], MD  carvedilol (COREG) 12.5 MG tablet TAKE 1 TABLET BY MOUTH  TWICE DAILY WITH MEALS 02/26/20   [name], MD  Cholecalciferol (VITAMIN D3) 1000 UNITS CAPS Take 1 capsule (1,000 Units total) by mouth daily. 03/22/13   [name], MD  finasteride (PROSCAR) 5 MG tablet Take 5 mg by mouth daily. 07/11/20   [provider]  fluticasone (CUTIVATE) 0.005 % ointment SMARTSIG:1 Sparingly Topical Daily 07/09/20   [provider]  fluticasone furoate-vilanterol (BREO ELLIPTA) 200-25 MCG/INH AEPB Inhale 1 puff into the lungs daily.     [provider]  furosemide (LASIX) 20 MG tablet Take 20 mg by mouth daily. 08/13/20   [provider]  glimepiride (AMARYL) 2 MG tablet TAKE 2 TABLETS BY MOUTH  DAILY BEFORE BREAKFAST 02/26/20   [name], MD  glucose blood (ONE TOUCH ULTRA TEST) test strip Use as instructed E11.9 09/17/17   [name], MD  hydrALAZINE (APRESOLINE) 10 MG tablet Take 10 mg by mouth 2 (two) times daily. 07/29/20   [provider]  hydrALAZINE (APRESOLINE) 25 MG tablet Take 25 mg by mouth 2 (two) times daily. 08/13/20   [provider]  hydrocortisone 2.5 % ointment Apply topically 2 (two) times daily. 05/28/20   [provider]  hydrOXYzine (ATARAX/VISTARIL) 10 MG tablet  04/29/20   [provider]  ketoconazole (NIZORAL) 2 % cream Apply 1 application topically daily. 02/27/20   [name], MD  lidocaine (LIDODERM) 5 % Place 1 patch onto the skin daily. Remove & Discard patch within 12 hours or as directed by MD 04/04/18   [name], MD  losartan (COZAAR) 100 MG tablet TAKE 1 TABLET BY MOUTH  DAILY 02/26/20   [name], MD  Multiple Vitamins-Minerals (MULTIVITAMIN PO) Take 1 tablet by mouth daily.    [provider]  NONFORMULARY OR COMPOUNDED ITEM Peripheral Neuropathy Cream: Bupivacaine 1%, Doxepin 3%, Gabapentin 6%, Pentoxifylline 3%, Topiramate 1% Order faxed to [facility] 04/21/19   Marzetta Board, DPM  nystatin ointment (MYCOSTATIN) Apply topically daily. 07/09/20   [provider]  NYSTATIN powder SMARTSIG:Sparingly Topical 2-3 Times Daily 04/08/20   [provider]  pantoprazole (PROTONIX) 40 MG tablet TAKE 1 TABLET BY MOUTH  DAILY 11/16/19   [name], MD  pimozide (ORAP) 2 MG tablet Take 2 mg by mouth 2 (two) times daily. 08/21/20   [provider]  tamsulosin (FLOMAX) 0.4 MG CAPS capsule TAKE 1 CAPSULE BY MOUTH  DAILY 11/16/19   [name], MD  triamcinolone (KENALOG) 0.1 % APPLY A SMALL AMOUNT  TO AFFECTED AREA TWICE A DAY AS NEEDED 04/29/20   [provider]  triamcinolone ointment (KENALOG) 0.1 % Apply 1 application topically 3 (three) times daily as needed for rash or itching. 08/20/20   [provider]    Allergies    Amlodipine, Cymbalta [duloxetine hcl], and Oxycodone  Review of Systems   Review of Systems  Constitutional:  Negative for fever.  Eyes:  Negative for visual disturbance.  Respiratory:  Negative for shortness of breath.   Cardiovascular:  Negative for chest pain.  Neurological:  Positive for dizziness. Negative for weakness and headaches.  All other systems reviewed and are negative.  Physical Exam Updated Vital Signs BP (!) 201/77   Pulse 77   Temp 98.9 F (37.2 C) (Oral)   Resp 18   Ht _0  (1.905 m)   Wt 81.6 kg   SpO2 91%   BMI 22.50 kg/m   Physical Exam Vitals and nursing note reviewed.  Constitutional:      General: He is not in acute distress.    Appearance: He is well-developed. He is not ill-appearing or diaphoretic.  HENT:     Head: Normocephalic and atraumatic.     Right Ear: External ear normal.     Left Ear: External ear normal.     Nose: Nose normal.  Eyes:     General:        Right eye: No discharge.        Left eye: No discharge.     Extraocular Movements: Extraocular movements intact.     Pupils: Pupils are equal, round, and reactive to light.  Cardiovascular:     Rate and Rhythm: Normal rate and regular rhythm.     Heart sounds: Normal heart sounds.  Pulmonary:     Effort: Pulmonary effort is normal.     Breath sounds: Normal breath sounds.  Abdominal:     Palpations: Abdomen is soft.     Tenderness: There is no abdominal tenderness.  Musculoskeletal:     Cervical back: Neck supple.  Skin:    General: Skin is warm and dry.  Neurological:     Mental Status: He is alert.     Comments: CN  3-12 grossly intact. 5/5 strength in all 4 extremities. Grossly normal sensation. Normal finger to nose. He is  able to get up with assistance but doesn't take a step after due to feeling off balance and needs to sit down  Psychiatric:        Mood and Affect: Mood is not anxious.    ED Results / Procedures / Treatments   Labs (all labs ordered are listed, but only abnormal results are displayed) Labs Reviewed  CBG MONITORING, ED - Abnormal; Notable for the following components:      Result Value   Glucose-Capillary 205 (*)    All other components within normal limits  BASIC METABOLIC PANEL  CBC WITH DIFFERENTIAL/PLATELET    EKG EKG Interpretation  Date/Time:  Sunday September 08 2020 19:48:43 EDT Ventricular Rate:  66 PR Interval:  196 QRS Duration: 164 QT Interval:  434 QTC Calculation: 454 R Axis:   -77 Text Interpretation: Sinus rhythm with Premature atrial complexes Left axis deviation Right bundle branch block Septal infarct , age undetermined Abnormal ECG Confirmed by Sherwood Gambler [phone]) on 09/08/2020 8:45:50 PM  Radiology No results found.  Procedures Procedures   Medications Ordered in ED Medications  sodium chloride 0.9 % bolus 500 mL (has no administration in time range)  meclizine (ANTIVERT) tablet 25 mg (has no administration in time range)    ED Course  I have reviewed the triage vital signs and the nursing notes.  Pertinent labs & imaging results that were available during my care of the patient were reviewed by me and considered in my medical decision making (see chart for details).    MDM Rules/Calculators/A&P                          Presentation is consistent with peripheral vertigo.  Patient had an MRI obtained given the hypertension and difficulty with walking.  MRI is unremarkable.  He is now able to ambulate after some fluids and Antivert.  Potassium was markedly hemolyzed and so repeat was obtained.  Assuming this is okay he can be discharged home.  Care to Dr. Randal Buba.  Final Clinical Impression(s) / ED Diagnoses Final diagnoses:  Vertigo    Rx / DC  Orders ED Discharge Orders     None        Sherwood Gambler, MD 09/08/20 2310

## 2020-09-08 NOTE — ED Notes (Signed)
Patient transported to MRI 

## 2020-09-12 ENCOUNTER — Ambulatory Visit (INDEPENDENT_AMBULATORY_CARE_PROVIDER_SITE_OTHER): Payer: Medicare Other | Admitting: Internal Medicine

## 2020-09-12 ENCOUNTER — Other Ambulatory Visit: Payer: Self-pay

## 2020-09-12 ENCOUNTER — Encounter: Payer: Self-pay | Admitting: Internal Medicine

## 2020-09-12 VITALS — BP 172/74 | HR 62 | Temp 97.8°F | Ht 74.0 in | Wt 184.2 lb

## 2020-09-12 DIAGNOSIS — E1140 Type 2 diabetes mellitus with diabetic neuropathy, unspecified: Secondary | ICD-10-CM | POA: Diagnosis not present

## 2020-09-12 DIAGNOSIS — I10 Essential (primary) hypertension: Secondary | ICD-10-CM | POA: Diagnosis not present

## 2020-09-12 DIAGNOSIS — R42 Dizziness and giddiness: Secondary | ICD-10-CM

## 2020-09-12 DIAGNOSIS — E7800 Pure hypercholesterolemia, unspecified: Secondary | ICD-10-CM | POA: Diagnosis not present

## 2020-09-12 DIAGNOSIS — E559 Vitamin D deficiency, unspecified: Secondary | ICD-10-CM

## 2020-09-12 LAB — POCT GLYCOSYLATED HEMOGLOBIN (HGB A1C): Hemoglobin A1C: 7.6 % — AB (ref 4.0–5.6)

## 2020-09-12 NOTE — Assessment & Plan Note (Signed)
Last vitamin D Lab Results  Component Value Date   VD25OH 36 10/04/2019   Stable, cont oral replacement

## 2020-09-12 NOTE — Assessment & Plan Note (Signed)
Improved symptoms, cont meclizine prn

## 2020-09-12 NOTE — Progress Notes (Signed)
Patient ID: Nicholas Patton, male   DOB: [birth year], 86 y.o.   MRN: [id number]        Chief Complaint: follow up HTN, hyperglycemia , ckd, vertigo       HPI:  Nicholas Patton is a 86 y.o. male here to f/u overall doing well today, but had recent vertigo requiring ED visit with MRI neg, basic labs unrevealing, and meclizine appears to be working quite well.  Lasix added per renal but denies orthostasis.  BP has been well controlled at home < 140/90, does not want to change meds today. Pt denies chest pain, increased sob or doe, wheezing, orthopnea, PND, increased LE swelling, palpitations, or syncope.  Pt denies polydipsia, polyuria, or new focal neuro s/s   Has chronic left ear deafness no change.         Wt Readings from Last 3 Encounters:  09/12/20 184 lb 3.2 oz (83.6 kg)  09/08/20 180 lb (81.6 kg)  08/29/20 180 lb (81.6 kg)   BP Readings from Last 3 Encounters:  09/12/20 (!) 172/74  09/09/20 (!) 155/76  09/06/20 (!) 165/66         Past Medical History:  Diagnosis Date   Anemia    Arthritis    Cataract    REMOVED BILATERAL   Colon polyps    DIABETES MELLITUS, TYPE II    takes Metformin and Glimepiride daily   Diverticulosis    GERD (gastroesophageal reflux disease)    takes Protonix daily   History of colon polyps    HYPERLIPIDEMIA    takes Atorvastatin daily   HYPERTENSION    takes Amlodipine and Hyzaar daily   Ileus following gastrointestinal surgery ([place]) 07/22/2012   Joint pain    Peripheral neuropathy    Peripheral neuropathy    PERIPHERAL NEUROPATHY, FEET 10/06/2007   Renal disorder    VITAMIN B12 DEFICIENCY 09/06/2008   VITAMIN D DEFICIENCY 01/16/2010   takes Vitamin D daily   Past Surgical History:  Procedure Laterality Date   CATARACT EXTRACTION     both eyes   CHOLECYSTECTOMY N/A 07/18/2012   Procedure: LAPAROSCOPIC CHOLECYSTECTOMY WITH INTRAOPERATIVE CHOLANGIOGRAM;  Surgeon: Zenovia Jarred, MD;  Location: [facility];  Service: General;  Laterality: N/A;    COLONOSCOPY     POLYPECTOMY     POSTERIOR CERVICAL FUSION/FORAMINOTOMY N/A 06/27/2015   Procedure: Posterior Cervical Fusion with lateral mass fixation Cervical four to cervical seven;  Surgeon: Eustace Moore, MD;  Location: [place] [facility];  Service: Neurosurgery;  Laterality: N/A;   TOTAL KNEE ARTHROPLASTY  2001   Left   TURP VAPORIZATION      reports that he has quit smoking. His smoking use included cigarettes. He has never used smokeless tobacco. He reports that he does not drink alcohol and does not use drugs. family history includes Diabetes Mellitus II in his mother; Lung cancer in his brother. Allergies  Allergen Reactions   Amlodipine Swelling   Cymbalta [Duloxetine Hcl]    Oxycodone    Current Outpatient Medications on File Prior to Visit  Medication Sig Dispense Refill   aspirin 81 MG EC tablet Take 81 mg by mouth daily.     atorvastatin (LIPITOR) 40 MG tablet TAKE 1 TABLET BY MOUTH  DAILY 90 tablet 3   carvedilol (COREG) 12.5 MG tablet TAKE 1 TABLET BY MOUTH  TWICE DAILY WITH MEALS 180 tablet 3   Cholecalciferol (VITAMIN D3) 1000 UNITS CAPS Take 1 capsule (1,000 Units total) by mouth daily. 90 capsule  3   finasteride (PROSCAR) 5 MG tablet Take 5 mg by mouth daily.     fluticasone furoate-vilanterol (BREO ELLIPTA) 200-25 MCG/INH AEPB Inhale 1 puff into the lungs daily.     furosemide (LASIX) 20 MG tablet Take 20 mg by mouth daily.     glimepiride (AMARYL) 2 MG tablet TAKE 2 TABLETS BY MOUTH  DAILY BEFORE BREAKFAST 180 tablet 3   glucose blood (ONE TOUCH ULTRA TEST) test strip Use as instructed E11.9 200 each 12   hydrALAZINE (APRESOLINE) 25 MG tablet Take 25 mg by mouth 2 (two) times daily.     losartan (COZAAR) 100 MG tablet TAKE 1 TABLET BY MOUTH  DAILY 90 tablet 3   meclizine (ANTIVERT) 25 MG tablet Take 1 tablet (25 mg total) by mouth 3 (three) times daily as needed for dizziness. 15 tablet 0   Multiple Vitamins-Minerals (MULTIVITAMIN PO) Take 1 tablet by mouth daily.      pantoprazole (PROTONIX) 40 MG tablet TAKE 1 TABLET BY MOUTH  DAILY 90 tablet 3   tamsulosin (FLOMAX) 0.4 MG CAPS capsule TAKE 1 CAPSULE BY MOUTH  DAILY 90 capsule 3   triamcinolone ointment (KENALOG) 0.1 % Apply 1 application topically 3 (three) times daily as needed for rash or itching.     No current facility-administered medications on file prior to visit.        ROS:  All others reviewed and negative.  Objective        PE:  BP (!) 172/74 (BP Location: Left Arm, Patient Position: Sitting, Cuff Size: Normal)   Pulse 62   Temp 97.8 F (36.6 C) (Oral)   Ht _0  (1.88 m)   Wt 184 lb 3.2 oz (83.6 kg)   SpO2 99%   BMI 23.65 kg/m                 Constitutional: Pt appears in NAD               HENT: Head: NCAT.                Right Ear: External ear normal.                 Left Ear: External ear normal.                Eyes: . Pupils are equal, round, and reactive to light. Conjunctivae and EOM are normal               Nose: without d/c or deformity               Neck: Neck supple. Gross normal ROM               Cardiovascular: Normal rate and regular rhythm.                 Pulmonary/Chest: Effort normal and breath sounds without rales or wheezing.                Abd:  Soft, NT, ND, + BS, no organomegaly               Neurological: Pt is alert. At baseline orientation, motor grossly intact               Skin: Skin is warm. No rashes, no other new lesions, LE edema - none               Psychiatric: Pt behavior is normal without agitation   Micro: none  Cardiac tracings I have personally interpreted today:  none  Pertinent Radiological findings (summarize): 09/08/20 MRI brain   IMPRESSION: 1. No acute intracranial abnormality. 2. Findings of chronic small vessel ischemia   Lab Results  Component Value Date   WBC 4.5 09/08/2020   HGB 13.9 09/08/2020   HCT 42.7 09/08/2020   PLT 155 09/08/2020   GLUCOSE 195 (H) 09/08/2020   CHOL 123 04/05/2020   TRIG 60.0 04/05/2020   HDL  47.20 04/05/2020   LDLCALC 63 04/05/2020   ALT 24 04/05/2020   AST 25 04/05/2020   NA 137 09/08/2020   K 3.2 (L) 09/08/2020   CL 105 09/08/2020   CREATININE 1.88 (H) 09/08/2020   BUN 37 (H) 09/08/2020   CO2 22 09/08/2020   TSH 4.48 04/06/2019   PSA 4.61 (H) 04/09/2017   INR 1.12 06/19/2015   HGBA1C 7.6 (A) 09/12/2020   MICROALBUR 0.8 04/06/2019   POCT HgB A1C Order: [id number] Status: Final result   Visible to patient: No (scheduled for 09/12/2020  5:35 PM)   Dx: Type 2 diabetes mellitus with diabeti...   0 Result Notes  Component Ref Range & Units 16:35  (09/12/20) 5 mo ago  (04/05/20) 11 mo ago  (10/04/19) 1 yr ago  (04/06/19) 1 yr ago  (10/04/18) 2 yr ago  (04/04/18) 2 yr ago  (10/08/17)  Hemoglobin A1C 4.0 - 5.6 % 7.6 Abnormal   7.7 High  R, CM  6.6 High  R, CM  7.0 High  R, CM  6.8 High  R, CM  7.0 High  R, CM  6.7 High  R, CM   HbA1c POC (<> result,               Assessment/Plan:  Nicholas Patton is a 86 y.o. Black or African American [2] male with  has a past medical history of Anemia, Arthritis, Cataract, Colon polyps, DIABETES MELLITUS, TYPE II, Diverticulosis, GERD (gastroesophageal reflux disease), History of colon polyps, HYPERLIPIDEMIA, HYPERTENSION, Ileus following gastrointestinal surgery ([place]) (07/22/2012), Joint pain, Peripheral neuropathy, Peripheral neuropathy, PERIPHERAL NEUROPATHY, FEET (10/06/2007), Renal disorder, VITAMIN B12 DEFICIENCY (09/06/2008), and VITAMIN D DEFICIENCY (01/16/2010).  Diabetes Lab Results  Component Value Date   HGBA1C 7.6 (A) 09/12/2020   Mild uncontrolled, pt to continue current medical treatment with amaryl as goal a1c is 7.5 given age,  to f/u any worsening symptoms or concerns   Essential hypertension BP Readings from Last 3 Encounters:  09/12/20 (!) 172/74  09/09/20 (!) 155/76  09/06/20 (!) 165/66   Stable per pt at home, pt to continue medical treatment as is - coreg, hydralazine   Hyperlipidemia Lab Results  Component Value  Date   LDLCALC 63 04/05/2020   Stable, pt to continue current statin lipitor 40   Vitamin D deficiency Last vitamin D Lab Results  Component Value Date   VD25OH 36 10/04/2019   Stable, cont oral replacement   Vertigo Improved symptoms, cont meclizine prn  Followup: Return if symptoms worsen or fail to improve.  Cathlean Cower, MD 09/12/2020 10:05 PM [facility]

## 2020-09-12 NOTE — Patient Instructions (Addendum)
Your A1c was 7.6 today  Please continue to monitor your blood sugars, try to remain active, and check your sugars in the evening every few days, and call if gets over 200.  Please continue all other medications as before, and refills have been done if requested.  Please have the pharmacy call with any other refills you may need.  Please continue your efforts at being more active, low cholesterol diet, and weight control.  You are otherwise up to date with prevention measures today.  Please keep your appointments with your specialists as you may have planned

## 2020-09-12 NOTE — Assessment & Plan Note (Signed)
Lab Results  Component Value Date   LDLCALC 63 04/05/2020   Stable, pt to continue current statin lipitor 40

## 2020-09-12 NOTE — Assessment & Plan Note (Signed)
Lab Results  Component Value Date   HGBA1C 7.6 (A) 09/12/2020   Mild uncontrolled, pt to continue current medical treatment with amaryl as goal a1c is 7.5 given age,  to f/u any worsening symptoms or concerns

## 2020-09-12 NOTE — Assessment & Plan Note (Addendum)
BP Readings from Last 3 Encounters:  09/12/20 (!) 172/74  09/09/20 (!) 155/76  09/06/20 (!) 165/66   Stable per pt at home but uncontrolled here, pt to continue medical treatment as is - coreg, hydralazine

## 2020-09-17 ENCOUNTER — Telehealth: Payer: Self-pay | Admitting: Internal Medicine

## 2020-09-17 MED ORDER — PIOGLITAZONE HCL 15 MG PO TABS: 15.0000 mg | ORAL_TABLET | Freq: Every day | ORAL | 3 refills | Status: DC

## 2020-09-17 NOTE — Telephone Encounter (Signed)
Ok I sent a rx for low dose actos 15 mg to [facility]  We have to be careful given his age and slowing kidneys ,  ok to take all other medications as he is doing

## 2020-09-17 NOTE — Telephone Encounter (Signed)
  Patient called and said that his blood sugar was 220 yesterday and this morning it was 160. He was wondering if Dr. [name] wanted to change his medication. He can be reached at [phone]

## 2020-09-18 NOTE — Telephone Encounter (Signed)
Patient notified

## 2020-09-25 DIAGNOSIS — L304 Erythema intertrigo: Secondary | ICD-10-CM | POA: Diagnosis not present

## 2020-10-04 ENCOUNTER — Other Ambulatory Visit: Payer: Self-pay

## 2020-10-04 ENCOUNTER — Ambulatory Visit (INDEPENDENT_AMBULATORY_CARE_PROVIDER_SITE_OTHER): Payer: Medicare Other | Admitting: Internal Medicine

## 2020-10-04 VITALS — BP 140/80 | HR 55 | Temp 97.7°F | Ht 74.0 in | Wt 186.0 lb

## 2020-10-04 DIAGNOSIS — M792 Neuralgia and neuritis, unspecified: Secondary | ICD-10-CM | POA: Diagnosis not present

## 2020-10-04 DIAGNOSIS — N1832 Chronic kidney disease, stage 3b: Secondary | ICD-10-CM

## 2020-10-04 DIAGNOSIS — E559 Vitamin D deficiency, unspecified: Secondary | ICD-10-CM | POA: Diagnosis not present

## 2020-10-04 DIAGNOSIS — E1140 Type 2 diabetes mellitus with diabetic neuropathy, unspecified: Secondary | ICD-10-CM | POA: Diagnosis not present

## 2020-10-04 DIAGNOSIS — I10 Essential (primary) hypertension: Secondary | ICD-10-CM | POA: Diagnosis not present

## 2020-10-04 MED ORDER — DOXAZOSIN MESYLATE 2 MG PO TABS: 2.0000 mg | ORAL_TABLET | Freq: Every day | ORAL | 3 refills | Status: AC

## 2020-10-04 NOTE — Progress Notes (Signed)
Chief Complaint: follow up DM, htn, hld, neuropathy, ckd and swelling       HPI:  Nicholas Patton is a 86 y.o. male here with c/o mild worsening tingling numbness to the legs from about mid legs and distal, seems to be just a bit worse before bed, and wondering if this could be a circulation problem.  Pt denies chest pain, increased sob or doe, wheezing, orthopnea, PND, increased LE swelling, palpitations, dizziness or syncope, and denies bilat leg pain worse with ambulation and/or better with rest.   Pt denies polydipsia, polyuria, or low sugar symptoms though just started actos 15 mg on July 18 after last a1c was still 7.6.  BP at home has been < 140/90.  Has had recent worsening pedal edema and stated on 20 mg lasix per renal, and has no edema today       Wt Readings from Last 3 Encounters:  10/04/20 186 lb (84.4 kg)  09/12/20 184 lb 3.2 oz (83.6 kg)  09/08/20 180 lb (81.6 kg)   BP Readings from Last 3 Encounters:  10/04/20 140/80  09/12/20 (!) 172/74  09/09/20 (!) 155/76         Past Medical History:  Diagnosis Date   Anemia    Arthritis    Cataract    REMOVED BILATERAL   Colon polyps    DIABETES MELLITUS, TYPE II    takes Metformin and Glimepiride daily   Diverticulosis    GERD (gastroesophageal reflux disease)    takes Protonix daily   History of colon polyps    HYPERLIPIDEMIA    takes Atorvastatin daily   HYPERTENSION    takes Amlodipine and Hyzaar daily   Ileus following gastrointestinal surgery ([place]) 07/22/2012   Joint pain    Peripheral neuropathy    Peripheral neuropathy    PERIPHERAL NEUROPATHY, FEET 10/06/2007   Renal disorder    VITAMIN B12 DEFICIENCY 09/06/2008   VITAMIN D DEFICIENCY 01/16/2010   takes Vitamin D daily   Past Surgical History:  Procedure Laterality Date   CATARACT EXTRACTION     both eyes   CHOLECYSTECTOMY N/A 07/18/2012   Procedure: LAPAROSCOPIC CHOLECYSTECTOMY WITH INTRAOPERATIVE CHOLANGIOGRAM;  Surgeon: Zenovia Jarred, MD;  Location:  [facility];  Service: General;  Laterality: N/A;   COLONOSCOPY     POLYPECTOMY     POSTERIOR CERVICAL FUSION/FORAMINOTOMY N/A 06/27/2015   Procedure: Posterior Cervical Fusion with lateral mass fixation Cervical four to cervical seven;  Surgeon: Eustace Moore, MD;  Location: [facility];  Service: Neurosurgery;  Laterality: N/A;   TOTAL KNEE ARTHROPLASTY  2001   Left   TURP VAPORIZATION      reports that he has quit smoking. His smoking use included cigarettes. He has never used smokeless tobacco. He reports that he does not drink alcohol and does not use drugs. family history includes Diabetes Mellitus II in his mother; Lung cancer in his brother. Allergies  Allergen Reactions   Amlodipine Swelling   Cymbalta [Duloxetine Hcl]    Oxycodone    Current Outpatient Medications on File Prior to Visit  Medication Sig Dispense Refill   aspirin 81 MG EC tablet Take 81 mg by mouth daily.     atorvastatin (LIPITOR) 40 MG tablet TAKE 1 TABLET BY MOUTH  DAILY 90 tablet 3   carvedilol (COREG) 12.5 MG tablet TAKE 1 TABLET BY MOUTH  TWICE DAILY WITH MEALS 180 tablet 3   Cholecalciferol (VITAMIN D3) 1000 UNITS CAPS Take 1  capsule (1,000 Units total) by mouth daily. 90 capsule 3   finasteride (PROSCAR) 5 MG tablet Take 5 mg by mouth daily.     fluticasone furoate-vilanterol (BREO ELLIPTA) 200-25 MCG/INH AEPB Inhale 1 puff into the lungs daily.     furosemide (LASIX) 20 MG tablet Take 20 mg by mouth daily.     glimepiride (AMARYL) 2 MG tablet TAKE 2 TABLETS BY MOUTH  DAILY BEFORE BREAKFAST 180 tablet 3   glucose blood (ONE TOUCH ULTRA TEST) test strip Use as instructed E11.9 200 each 12   losartan (COZAAR) 100 MG tablet TAKE 1 TABLET BY MOUTH  DAILY 90 tablet 3   Multiple Vitamins-Minerals (MULTIVITAMIN PO) Take 1 tablet by mouth daily.     pantoprazole (PROTONIX) 40 MG tablet TAKE 1 TABLET BY MOUTH  DAILY 90 tablet 3   pioglitazone (ACTOS) 15 MG tablet Take 1 tablet (15 mg total) by mouth daily. 90 tablet 3    tamsulosin (FLOMAX) 0.4 MG CAPS capsule TAKE 1 CAPSULE BY MOUTH  DAILY 90 capsule 3   No current facility-administered medications on file prior to visit.        ROS:  All others reviewed and negative.  Objective        PE:  BP 140/80   Pulse (!) 55   Temp 97.7 F (36.5 C) (Oral)   Ht _0  (1.88 m)   Wt 186 lb (84.4 kg)   SpO2 94%   BMI 23.88 kg/m                 Constitutional: Pt appears in NAD               HENT: Head: NCAT.                Right Ear: External ear normal.                 Left Ear: External ear normal.                Eyes: . Pupils are equal, round, and reactive to light. Conjunctivae and EOM are normal               Nose: without d/c or deformity               Neck: Neck supple. Gross normal ROM               Cardiovascular: Normal rate and regular rhythm.                 Pulmonary/Chest: Effort normal and breath sounds without rales or wheezing.                Abd:  Soft, NT, ND, + BS, no organomegaly               Neurological: Pt is alert. At baseline orientation, motor grossly intact               Skin: Skin is warm. No rashes, no other new lesions, LE edema - none; dorsalis pedis pulse 1-2+ bilateral               Psychiatric: Pt behavior is normal without agitation   Micro: none  Cardiac tracings I have personally interpreted today:  none  Pertinent Radiological findings (summarize): none   Lab Results  Component Value Date   WBC 4.5 09/08/2020   HGB 13.9 09/08/2020   HCT 42.7 09/08/2020   PLT 155 09/08/2020  GLUCOSE 195 (H) 09/08/2020   CHOL 123 04/05/2020   TRIG 60.0 04/05/2020   HDL 47.20 04/05/2020   LDLCALC 63 04/05/2020   ALT 24 04/05/2020   AST 25 04/05/2020   NA 137 09/08/2020   K 3.2 (L) 09/08/2020   CL 105 09/08/2020   CREATININE 1.88 (H) 09/08/2020   BUN 37 (H) 09/08/2020   CO2 22 09/08/2020   TSH 4.48 04/06/2019   PSA 4.61 (H) 04/09/2017   INR 1.12 06/19/2015   HGBA1C 7.6 (A) 09/12/2020   MICROALBUR 0.8 04/06/2019    Assessment/Plan:  Nicholas Patton is a 86 y.o. Black or African American [2] male with  has a past medical history of Anemia, Arthritis, Cataract, Colon polyps, DIABETES MELLITUS, TYPE II, Diverticulosis, GERD (gastroesophageal reflux disease), History of colon polyps, HYPERLIPIDEMIA, HYPERTENSION, Ileus following gastrointestinal surgery ([place]) (07/22/2012), Joint pain, Peripheral neuropathy, Peripheral neuropathy, PERIPHERAL NEUROPATHY, FEET (10/06/2007), Renal disorder, VITAMIN B12 DEFICIENCY (09/06/2008), and VITAMIN D DEFICIENCY (01/16/2010).  Peripheral neuropathic pain D/w pt, with this discomfort some worse recently, declines further med tx such as qhs elavil due to polypharmacy, just wanted to be sure his arterial circulation was adequate  Vitamin D deficiency Last vitamin D Lab Results  Component Value Date   VD25OH 36 10/04/2019   Stable, cont oral replacement  Essential hypertension BP Readings from Last 3 Encounters:  10/04/20 140/80  09/12/20 (!) 172/74  09/09/20 (!) 155/76   Stable, pt to continue medical treatment losartan, coreg   Diabetes Just started actos 15, doubt related to pedal edema, will cont as is with f/u a1c next visit Lab Results  Component Value Date   HGBA1C 7.6 (A) 09/12/2020     CKD (chronic kidney disease) stage 3, GFR 30-59 ml/min Lab Results  Component Value Date   CREATININE 1.88 (H) 09/08/2020   Stable overall, cont to avoid nephrotoxins, and f/u renal as planned  Followup: Return in about 3 months (around 01/04/2021).  Cathlean Cower, MD 10/05/2020 11:31 AM [facility]

## 2020-10-04 NOTE — Patient Instructions (Signed)
Please continue to follow your daily weight and swelling in the legs  Please continue all other medications as before, and refills have been done if requested.  Please have the pharmacy call with any other refills you may need.  Please continue your efforts at being more active, low cholesterol diet, and weight control.  Please keep your appointments with your specialists as you may have planned  Please make an Appointment to return in 3 months, or sooner if needed

## 2020-10-05 ENCOUNTER — Encounter: Payer: Self-pay | Admitting: Internal Medicine

## 2020-10-05 NOTE — Assessment & Plan Note (Signed)
D/w pt, with this discomfort some worse recently, declines further med tx such as qhs elavil due to polypharmacy, just wanted to be sure his arterial circulation was adequate

## 2020-10-05 NOTE — Assessment & Plan Note (Signed)
BP Readings from Last 3 Encounters:  10/04/20 140/80  09/12/20 (!) 172/74  09/09/20 (!) 155/76   Stable, pt to continue medical treatment losartan, coreg

## 2020-10-05 NOTE — Assessment & Plan Note (Signed)
Lab Results  Component Value Date   CREATININE 1.88 (H) 09/08/2020   Stable overall, cont to avoid nephrotoxins, and f/u renal as planned

## 2020-10-05 NOTE — Assessment & Plan Note (Signed)
Last vitamin D Lab Results  Component Value Date   VD25OH 36 10/04/2019   Stable, cont oral replacement  

## 2020-10-05 NOTE — Assessment & Plan Note (Signed)
Just started actos 15, doubt related to pedal edema, will cont as is with f/u a1c next visit Lab Results  Component Value Date   HGBA1C 7.6 (A) 09/12/2020

## 2020-10-10 DIAGNOSIS — Z23 Encounter for immunization: Secondary | ICD-10-CM | POA: Diagnosis not present

## 2020-10-17 DIAGNOSIS — N1832 Chronic kidney disease, stage 3b: Secondary | ICD-10-CM | POA: Diagnosis not present

## 2020-10-23 ENCOUNTER — Inpatient Hospital Stay: Payer: Medicare Other | Attending: Hematology and Oncology

## 2020-10-23 ENCOUNTER — Other Ambulatory Visit: Payer: Self-pay

## 2020-10-23 DIAGNOSIS — D472 Monoclonal gammopathy: Secondary | ICD-10-CM

## 2020-10-23 DIAGNOSIS — M7989 Other specified soft tissue disorders: Secondary | ICD-10-CM | POA: Insufficient documentation

## 2020-10-23 LAB — CMP (CANCER CENTER ONLY)
ALT: 29 U/L (ref 0–44)
AST: 26 U/L (ref 15–41)
Albumin: 3.9 g/dL (ref 3.5–5.0)
Alkaline Phosphatase: 98 U/L (ref 38–126)
Anion gap: 10 (ref 5–15)
BUN: 42 mg/dL — ABNORMAL HIGH (ref 8–23)
CO2: 24 mmol/L (ref 22–32)
Calcium: 9.8 mg/dL (ref 8.9–10.3)
Chloride: 108 mmol/L (ref 98–111)
Creatinine: 2.06 mg/dL — ABNORMAL HIGH (ref 0.61–1.24)
GFR, Estimated: 31 mL/min — ABNORMAL LOW (ref >60)
Glucose, Bld: 197 mg/dL — ABNORMAL HIGH (ref 70–99)
Potassium: 4.2 mmol/L (ref 3.5–5.1)
Sodium: 142 mmol/L (ref 135–145)
Total Bilirubin: 0.7 mg/dL (ref 0.3–1.2)
Total Protein: 6.5 g/dL (ref 6.5–8.1)

## 2020-10-23 LAB — CBC WITH DIFFERENTIAL (CANCER CENTER ONLY)
Abs Immature Granulocytes: 0.01 10*3/uL (ref 0.00–0.07)
Basophils Absolute: 0 10*3/uL (ref 0.0–0.1)
Basophils Relative: 1 %
Eosinophils Absolute: 0.1 10*3/uL (ref 0.0–0.5)
Eosinophils Relative: 4 %
HCT: 39.9 % (ref 39.0–52.0)
Hemoglobin: 12.9 g/dL — ABNORMAL LOW (ref 13.0–17.0)
Immature Granulocytes: 0 %
Lymphocytes Relative: 28 %
Lymphs Abs: 0.8 10*3/uL (ref 0.7–4.0)
MCH: 30.1 pg (ref 26.0–34.0)
MCHC: 32.3 g/dL (ref 30.0–36.0)
MCV: 93 fL (ref 80.0–100.0)
Monocytes Absolute: 0.3 10*3/uL (ref 0.1–1.0)
Monocytes Relative: 11 %
Neutro Abs: 1.7 10*3/uL (ref 1.7–7.7)
Neutrophils Relative %: 56 %
Platelet Count: 150 10*3/uL (ref 150–400)
RBC: 4.29 MIL/uL (ref 4.22–5.81)
RDW: 14.1 % (ref 11.5–15.5)
WBC Count: 3 10*3/uL — ABNORMAL LOW (ref 4.0–10.5)
nRBC: 0 % (ref 0.0–0.2)

## 2020-10-24 LAB — KAPPA/LAMBDA LIGHT CHAINS
Kappa free light chain: 1697 mg/L — ABNORMAL HIGH (ref 3.3–19.4)
Kappa, lambda light chain ratio: 129.54 — ABNORMAL HIGH (ref 0.26–1.65)
Lambda free light chains: 13.1 mg/L (ref 5.7–26.3)

## 2020-10-24 LAB — BETA 2 MICROGLOBULIN, SERUM: Beta-2 Microglobulin: 3.7 mg/L — ABNORMAL HIGH (ref 0.6–2.4)

## 2020-10-27 LAB — MULTIPLE MYELOMA PANEL, SERUM
Albumin SerPl Elph-Mcnc: 3.5 g/dL (ref 2.9–4.4)
Albumin/Glob SerPl: 1.6 (ref 0.7–1.7)
Alpha 1: 0.2 g/dL (ref 0.0–0.4)
Alpha2 Glob SerPl Elph-Mcnc: 0.6 g/dL (ref 0.4–1.0)
B-Globulin SerPl Elph-Mcnc: 0.9 g/dL (ref 0.7–1.3)
Gamma Glob SerPl Elph-Mcnc: 0.6 g/dL (ref 0.4–1.8)
Globulin, Total: 2.3 g/dL (ref 2.2–3.9)
IgA: 249 mg/dL (ref 61–437)
IgG (Immunoglobin G), Serum: 508 mg/dL — ABNORMAL LOW (ref 603–1613)
IgM (Immunoglobulin M), Srm: 28 mg/dL (ref 15–143)
M Protein SerPl Elph-Mcnc: 0.3 g/dL — ABNORMAL HIGH
Total Protein ELP: 5.8 g/dL — ABNORMAL LOW (ref 6.0–8.5)

## 2020-10-29 NOTE — Assessment & Plan Note (Signed)
Blood work review  09/20/2017: M protein 0.2 g(IgA kappa by immunofixation)Quantitative immunoglobulins: IgG 569, IgA 219, IgM 28 Bone survey 11/09/2017: Benign 10/25/2018:M protein 0.2 g (same as before) IgA kappa, Serum free kappa: 1409, KL ratio 136.8 (these labs were not done previously) 10/25/2019: M protein 0.2 g IgA kappa, kappa light chain: 1749, ratio 101, creatinine 1.76, beta-2 microglobulin: 3 10/23/2020: M protein: 0.3 g, Kappa 1697, lambda 13.1, ratio 129.54, hemoglobin 12.9, creatinine 2.06, beta-2 microglobulin 3.7  The M protein has not significantly increased and he does not have any endorgan disease. Therefore we decided to watch and monitor.  Return to clinic in 1 year with labs done 1 week ahead of time and follow-up.

## 2020-10-29 NOTE — Progress Notes (Signed)
Patient Care Team: [name], MD as PCP - General  DIAGNOSIS:    ICD-10-CM   1. MGUS (monoclonal gammopathy of unknown significance)  D47.2       CHIEF COMPLIANT: Follow-up of MGUS  INTERVAL HISTORY: Nicholas Patton is a 86 y.o. with above-mentioned history of  MGUS. Labs on 10/23/2020 showed: WBC 3.0, Hg 12.9, creatinine 2.06, m-protein 0.3, kappa light chains 1,697, ratio 129.54. He presents to the clinic today for annual follow-up.  He had leg swelling for which she was started on furosemide.  ALLERGIES:  is allergic to amlodipine, cymbalta [duloxetine hcl], and oxycodone.  MEDICATIONS:  Current Outpatient Medications  Medication Sig Dispense Refill   aspirin 81 MG EC tablet Take 81 mg by mouth daily.     atorvastatin (LIPITOR) 40 MG tablet TAKE 1 TABLET BY MOUTH  DAILY 90 tablet 3   carvedilol (COREG) 12.5 MG tablet TAKE 1 TABLET BY MOUTH  TWICE DAILY WITH MEALS 180 tablet 3   Cholecalciferol (VITAMIN D3) 1000 UNITS CAPS Take 1 capsule (1,000 Units total) by mouth daily. 90 capsule 3   doxazosin (CARDURA) 2 MG tablet Take 1 tablet (2 mg total) by mouth daily. 90 tablet 3   finasteride (PROSCAR) 5 MG tablet Take 5 mg by mouth daily.     fluticasone furoate-vilanterol (BREO ELLIPTA) 200-25 MCG/INH AEPB Inhale 1 puff into the lungs daily.     furosemide (LASIX) 20 MG tablet Take 20 mg by mouth daily.     glimepiride (AMARYL) 2 MG tablet TAKE 2 TABLETS BY MOUTH  DAILY BEFORE BREAKFAST 180 tablet 3   glucose blood (ONE TOUCH ULTRA TEST) test strip Use as instructed E11.9 200 each 12   losartan (COZAAR) 100 MG tablet TAKE 1 TABLET BY MOUTH  DAILY 90 tablet 3   Multiple Vitamins-Minerals (MULTIVITAMIN PO) Take 1 tablet by mouth daily.     pantoprazole (PROTONIX) 40 MG tablet TAKE 1 TABLET BY MOUTH  DAILY 90 tablet 3   pioglitazone (ACTOS) 15 MG tablet Take 1 tablet (15 mg total) by mouth daily. 90 tablet 3   tamsulosin (FLOMAX) 0.4 MG CAPS capsule TAKE 1 CAPSULE BY MOUTH  DAILY 90  capsule 3   No current facility-administered medications for this visit.    PHYSICAL EXAMINATION: ECOG PERFORMANCE STATUS: 1 - Symptomatic but completely ambulatory  Vitals:   10/30/20 1000  BP: (!) 150/59  Pulse: 72  Resp: 18  Temp: (!) 97.5 F (36.4 C)  SpO2: 95%   Filed Weights   10/30/20 1000  Weight: 187 lb 8 oz (85 kg)     LABORATORY DATA:  I have reviewed the data as listed CMP Latest Ref Rng & Units 10/23/2020 09/08/2020 09/08/2020  Glucose 70 - 99 mg/dL 197(H) - 195(H)  BUN 8 - 23 mg/dL 42(H) - 37(H)  Creatinine 0.61 - 1.24 mg/dL 2.06(H) - 1.88(H)  Sodium 135 - 145 mmol/L 142 - 137  Potassium 3.5 - 5.1 mmol/L 4.2 3.2(L) 5.0  Chloride 98 - 111 mmol/L 108 - 105  CO2 22 - 32 mmol/L 24 - 22  Calcium 8.9 - 10.3 mg/dL 9.8 - 9.2  Total Protein 6.5 - 8.1 g/dL 6.5 - -  Total Bilirubin 0.3 - 1.2 mg/dL 0.7 - -  Alkaline Phos 38 - 126 U/L 98 - -  AST 15 - 41 U/L 26 - -  ALT 0 - 44 U/L 29 - -    Lab Results  Component Value Date   WBC 3.0 (  L) 10/23/2020   HGB 12.9 (L) 10/23/2020   HCT 39.9 10/23/2020   MCV 93.0 10/23/2020   PLT 150 10/23/2020   NEUTROABS 1.7 10/23/2020    ASSESSMENT & PLAN:  MGUS (monoclonal gammopathy of unknown significance) Blood work review  09/20/2017: M protein 0.2 g (IgA kappa by immunofixation)Quantitative immunoglobulins: IgG 569, IgA 219, IgM 28 Bone survey 11/09/2017: Benign 10/25/2018: M protein 0.2 g (same as before) IgA kappa, Serum free kappa: 1409, KL ratio 136.8 (these labs were not done previously) 10/25/2019: M protein 0.2 g IgA kappa, kappa light chain: 1749, ratio 101, creatinine 1.76, beta-2 microglobulin: 3 10/23/2020: M protein: 0.3 g, Kappa 1697, lambda 13.1, ratio 129.54, hemoglobin 12.9, creatinine 2.06, beta-2 microglobulin 3.7   The M protein has not significantly increased and he does not have any endorgan disease. Therefore we decided to watch and monitor. The change in the serum creatinine is related to  furosemide.  Return to clinic in 1 year with labs done 1 week ahead of time and follow-up.   No orders of the defined types were placed in this encounter.  The patient has a good understanding of the overall plan. he agrees with it. he will call with any problems that may develop before the next visit here.  Total time spent: 20 mins including face to face time and time spent for planning, charting and coordination of care  Rulon Eisenmenger, MD, MPH 10/30/2020  I, Thana Ates, am acting as scribe for Dr. Nicholas Lose.  I have reviewed the above documentation for accuracy and completeness, and I agree with the above.

## 2020-10-30 ENCOUNTER — Inpatient Hospital Stay (HOSPITAL_BASED_OUTPATIENT_CLINIC_OR_DEPARTMENT_OTHER): Payer: Medicare Other | Admitting: Hematology and Oncology

## 2020-10-30 ENCOUNTER — Other Ambulatory Visit: Payer: Self-pay

## 2020-10-30 DIAGNOSIS — D472 Monoclonal gammopathy: Secondary | ICD-10-CM | POA: Diagnosis not present

## 2020-10-30 DIAGNOSIS — M7989 Other specified soft tissue disorders: Secondary | ICD-10-CM | POA: Diagnosis not present

## 2020-11-05 DIAGNOSIS — H11823 Conjunctivochalasis, bilateral: Secondary | ICD-10-CM | POA: Diagnosis not present

## 2020-11-05 DIAGNOSIS — H04123 Dry eye syndrome of bilateral lacrimal glands: Secondary | ICD-10-CM | POA: Diagnosis not present

## 2020-11-06 DIAGNOSIS — R600 Localized edema: Secondary | ICD-10-CM | POA: Diagnosis not present

## 2020-11-06 DIAGNOSIS — I129 Hypertensive chronic kidney disease with stage 1 through stage 4 chronic kidney disease, or unspecified chronic kidney disease: Secondary | ICD-10-CM | POA: Diagnosis not present

## 2020-11-06 DIAGNOSIS — D631 Anemia in chronic kidney disease: Secondary | ICD-10-CM | POA: Diagnosis not present

## 2020-11-06 DIAGNOSIS — N2581 Secondary hyperparathyroidism of renal origin: Secondary | ICD-10-CM | POA: Diagnosis not present

## 2020-11-06 DIAGNOSIS — N1832 Chronic kidney disease, stage 3b: Secondary | ICD-10-CM | POA: Diagnosis not present

## 2020-11-06 DIAGNOSIS — D472 Monoclonal gammopathy: Secondary | ICD-10-CM | POA: Diagnosis not present

## 2020-11-18 ENCOUNTER — Other Ambulatory Visit: Payer: Self-pay

## 2020-11-18 ENCOUNTER — Encounter: Payer: Self-pay | Admitting: Internal Medicine

## 2020-11-18 ENCOUNTER — Encounter: Payer: Self-pay | Admitting: Pulmonary Disease

## 2020-11-18 ENCOUNTER — Ambulatory Visit (INDEPENDENT_AMBULATORY_CARE_PROVIDER_SITE_OTHER): Payer: Medicare Other | Admitting: Pulmonary Disease

## 2020-11-18 ENCOUNTER — Ambulatory Visit (INDEPENDENT_AMBULATORY_CARE_PROVIDER_SITE_OTHER): Payer: Medicare Other | Admitting: Internal Medicine

## 2020-11-18 VITALS — BP 168/70 | HR 54 | Temp 97.8°F | Ht 74.0 in | Wt 186.0 lb

## 2020-11-18 VITALS — BP 150/80 | HR 89 | Temp 97.7°F | Ht 74.0 in | Wt 186.8 lb

## 2020-11-18 DIAGNOSIS — J4530 Mild persistent asthma, uncomplicated: Secondary | ICD-10-CM

## 2020-11-18 DIAGNOSIS — R0600 Dyspnea, unspecified: Secondary | ICD-10-CM

## 2020-11-18 DIAGNOSIS — I10 Essential (primary) hypertension: Secondary | ICD-10-CM | POA: Diagnosis not present

## 2020-11-18 DIAGNOSIS — M79669 Pain in unspecified lower leg: Secondary | ICD-10-CM

## 2020-11-18 DIAGNOSIS — N1832 Chronic kidney disease, stage 3b: Secondary | ICD-10-CM | POA: Diagnosis not present

## 2020-11-18 DIAGNOSIS — E7800 Pure hypercholesterolemia, unspecified: Secondary | ICD-10-CM

## 2020-11-18 DIAGNOSIS — M7989 Other specified soft tissue disorders: Secondary | ICD-10-CM

## 2020-11-18 DIAGNOSIS — E1140 Type 2 diabetes mellitus with diabetic neuropathy, unspecified: Secondary | ICD-10-CM | POA: Diagnosis not present

## 2020-11-18 DIAGNOSIS — E559 Vitamin D deficiency, unspecified: Secondary | ICD-10-CM

## 2020-11-18 LAB — HEMOGLOBIN A1C: Hgb A1c MFr Bld: 7.3 % — ABNORMAL HIGH (ref 4.6–6.5)

## 2020-11-18 LAB — LIPID PANEL
Cholesterol: 139 mg/dL (ref 0–200)
HDL: 61.6 mg/dL (ref >39.00)
LDL Cholesterol: 66 mg/dL (ref 0–99)
NonHDL: 77.55
Total CHOL/HDL Ratio: 2
Triglycerides: 60 mg/dL (ref 0.0–149.0)
VLDL: 12 mg/dL (ref 0.0–40.0)

## 2020-11-18 LAB — BASIC METABOLIC PANEL
BUN: 35 mg/dL — ABNORMAL HIGH (ref 6–23)
CO2: 29 mEq/L (ref 19–32)
Calcium: 10 mg/dL (ref 8.4–10.5)
Chloride: 104 mEq/L (ref 96–112)
Creatinine, Ser: 1.74 mg/dL — ABNORMAL HIGH (ref 0.40–1.50)
GFR: 35.6 mL/min — ABNORMAL LOW (ref >60.00)
Glucose, Bld: 125 mg/dL — ABNORMAL HIGH (ref 70–99)
Potassium: 4 mEq/L (ref 3.5–5.1)
Sodium: 142 mEq/L (ref 135–145)

## 2020-11-18 LAB — HEPATIC FUNCTION PANEL
ALT: 23 U/L (ref 0–53)
AST: 23 U/L (ref 0–37)
Albumin: 4.2 g/dL (ref 3.5–5.2)
Alkaline Phosphatase: 89 U/L (ref 39–117)
Bilirubin, Direct: 0.1 mg/dL (ref 0.0–0.3)
Total Bilirubin: 0.6 mg/dL (ref 0.2–1.2)
Total Protein: 6.9 g/dL (ref 6.0–8.3)

## 2020-11-18 NOTE — Patient Instructions (Addendum)
See you again  Continue the [facility] for now.  Try to increase your exercise, be more active over the next 4 weeks.  If still short winded, please call us and I will send in a new prescription for Trelegy which adds a third medicine to the [facility] to help keep your air tubes open.  They can get tight with asthma like your breathing test showed in the past.  We will meet again before Christmas.  We may need to do additional testing if you are still short winded on the new Trelegy inhaler.  Return to clinic in 3 months or sooner as needed with Dr. Silas Flood

## 2020-11-18 NOTE — Progress Notes (Signed)
Patient ID: Nicholas Patton, male   DOB: 09/04/1934, 86 y.o.   MRN: [id number]        Chief Complaint: follow up leg swelling, dm, htn, hld, ckd, low vit d       HPI:  Nicholas Patton is a 86 y.o. male here with c/o worsening leg swelling mild to mod x 1 wk; did have lasix decreased to 20 mg qd prn per renal; recenty, pt concerned about DVTs; Pt denies chest pain, increased sob or doe, wheezing, orthopnea, PND, palpitations, dizziness or syncope.  Pt denies polydipsia, polyuria, or new focal neuro s/s.   Pt denies fever, wt loss, night sweats, loss of appetite, or other constitutional symptoms  No other new complaints.   Pt states BP < 140/90 at home, declines med change for now       Wt Readings from Last 3 Encounters:  11/18/20 186 lb 12.8 oz (84.7 kg)  11/18/20 186 lb (84.4 kg)  10/30/20 187 lb 8 oz (85 kg)   BP Readings from Last 3 Encounters:  11/18/20 (!) 150/80  11/18/20 (!) 168/70  10/30/20 (!) 150/59         Past Medical History:  Diagnosis Date   Anemia    Arthritis    Cataract    REMOVED BILATERAL   Colon polyps    DIABETES MELLITUS, TYPE II    takes Metformin and Glimepiride daily   Diverticulosis    GERD (gastroesophageal reflux disease)    takes Protonix daily   History of colon polyps    HYPERLIPIDEMIA    takes Atorvastatin daily   HYPERTENSION    takes Amlodipine and Hyzaar daily   Ileus following gastrointestinal surgery ([place]) 07/22/2012   Joint pain    Peripheral neuropathy    Peripheral neuropathy    PERIPHERAL NEUROPATHY, FEET 10/06/2007   Renal disorder    VITAMIN B12 DEFICIENCY 09/06/2008   VITAMIN D DEFICIENCY 01/16/2010   takes Vitamin D daily   Past Surgical History:  Procedure Laterality Date   CATARACT EXTRACTION     both eyes   CHOLECYSTECTOMY N/A 07/18/2012   Procedure: LAPAROSCOPIC CHOLECYSTECTOMY WITH INTRAOPERATIVE CHOLANGIOGRAM;  Surgeon: Zenovia Jarred, MD;  Location: [facility];  Service: General;  Laterality: N/A;   COLONOSCOPY      POLYPECTOMY     POSTERIOR CERVICAL FUSION/FORAMINOTOMY N/A 06/27/2015   Procedure: Posterior Cervical Fusion with lateral mass fixation Cervical four to cervical seven;  Surgeon: Eustace Moore, MD;  Location: [facility];  Service: Neurosurgery;  Laterality: N/A;   TOTAL KNEE ARTHROPLASTY  2001   Left   TURP VAPORIZATION      reports that he has quit smoking. His smoking use included cigarettes. He has never used smokeless tobacco. He reports that he does not drink alcohol and does not use drugs. family history includes Diabetes Mellitus II in his mother; Lung cancer in his brother. Allergies  Allergen Reactions   Amlodipine Swelling   Cymbalta [Duloxetine Hcl]    Oxycodone    Current Outpatient Medications on File Prior to Visit  Medication Sig Dispense Refill   aspirin 81 MG EC tablet Take 81 mg by mouth daily.     atorvastatin (LIPITOR) 40 MG tablet TAKE 1 TABLET BY MOUTH  DAILY 90 tablet 3   carvedilol (COREG) 12.5 MG tablet TAKE 1 TABLET BY MOUTH  TWICE DAILY WITH MEALS 180 tablet 3   Cholecalciferol (VITAMIN D3) 1000 UNITS CAPS Take 1 capsule (1,000 Units total) by mouth  daily. 90 capsule 3   doxazosin (CARDURA) 2 MG tablet Take 1 tablet (2 mg total) by mouth daily. 90 tablet 3   finasteride (PROSCAR) 5 MG tablet Take 5 mg by mouth daily.     fluticasone furoate-vilanterol (BREO ELLIPTA) 200-25 MCG/INH AEPB Inhale 1 puff into the lungs daily.     furosemide (LASIX) 20 MG tablet Take 20 mg by mouth daily.     glimepiride (AMARYL) 2 MG tablet TAKE 2 TABLETS BY MOUTH  DAILY BEFORE BREAKFAST 180 tablet 3   glucose blood (ONE TOUCH ULTRA TEST) test strip Use as instructed E11.9 200 each 12   losartan (COZAAR) 100 MG tablet TAKE 1 TABLET BY MOUTH  DAILY 90 tablet 3   Multiple Vitamins-Minerals (MULTIVITAMIN PO) Take 1 tablet by mouth daily.     pantoprazole (PROTONIX) 40 MG tablet TAKE 1 TABLET BY MOUTH  DAILY 90 tablet 3   pioglitazone (ACTOS) 15 MG tablet Take 1 tablet (15 mg total)  by mouth daily. 90 tablet 3   tamsulosin (FLOMAX) 0.4 MG CAPS capsule TAKE 1 CAPSULE BY MOUTH  DAILY 90 capsule 3   No current facility-administered medications on file prior to visit.        ROS:  All others reviewed and negative.  Objective        PE:  BP (!) 168/70 (BP Location: Left Arm, Patient Position: Sitting, Cuff Size: Normal)   Pulse (!) 54   Temp 97.8 F (36.6 C) (Oral)   Ht _0  (1.88 m)   Wt 186 lb (84.4 kg)   SpO2 97%   BMI 23.88 kg/m                 Constitutional: Pt appears in NAD               HENT: Head: NCAT.                Right Ear: External ear normal.                 Left Ear: External ear normal.                Eyes: . Pupils are equal, round, and reactive to light. Conjunctivae and EOM are normal               Nose: without d/c or deformity               Neck: Neck supple. Gross normal ROM               Cardiovascular: Normal rate and regular rhythm.                 Pulmonary/Chest: Effort normal and breath sounds without rales or wheezing.                Abd:  Soft, NT, ND, + BS, no organomegaly               Neurological: Pt is alert. At baseline orientation, motor grossly intact               Skin: Skin is warm. No rashes, no other new lesions, LE edema - 1+ edema  bilat to knees               Psychiatric: Pt behavior is normal without agitation   Micro: none  Cardiac tracings I have personally interpreted today:  none  Pertinent Radiological findings (summarize): none   Lab Results  Component  Value Date   WBC 3.0 (L) 10/23/2020   HGB 12.9 (L) 10/23/2020   HCT 39.9 10/23/2020   PLT 150 10/23/2020   GLUCOSE 125 (H) 11/18/2020   CHOL 139 11/18/2020   TRIG 60.0 11/18/2020   HDL 61.60 11/18/2020   LDLCALC 66 11/18/2020   ALT 23 11/18/2020   AST 23 11/18/2020   NA 142 11/18/2020   K 4.0 11/18/2020   CL 104 11/18/2020   CREATININE 1.74 (H) 11/18/2020   BUN 35 (H) 11/18/2020   CO2 29 11/18/2020   TSH 4.48 04/06/2019   PSA 4.61 (H)  04/09/2017   INR 1.12 06/19/2015   HGBA1C 7.3 (H) 11/18/2020   MICROALBUR 0.8 04/06/2019   Assessment/Plan:  Nicholas Patton is a 86 y.o. Black or African American [2] male with  has a past medical history of Anemia, Arthritis, Cataract, Colon polyps, DIABETES MELLITUS, TYPE II, Diverticulosis, GERD (gastroesophageal reflux disease), History of colon polyps, HYPERLIPIDEMIA, HYPERTENSION, Ileus following gastrointestinal surgery ([place]) (07/22/2012), Joint pain, Peripheral neuropathy, Peripheral neuropathy, PERIPHERAL NEUROPATHY, FEET (10/06/2007), Renal disorder, VITAMIN B12 DEFICIENCY (09/06/2008), and VITAMIN D DEFICIENCY (01/16/2010).  Vitamin D deficiency Last vitamin D Lab Results  Component Value Date   VD25OH 36 10/04/2019   Low normal, to start oral replacement  Hyperlipidemia Lab Results  Component Value Date   LDLCALC 66 11/18/2020   Stable, pt to continue current statin lipitor 40   Essential hypertension BP Readings from Last 3 Encounters:  11/18/20 (!) 150/80  11/18/20 (!) 168/70  10/30/20 (!) 150/59   uncontrolled, pt to continue medical treatment coreg, cardura, losartan as declines change today   Diabetes Lab Results  Component Value Date   HGBA1C 7.3 (H) 11/18/2020   Mild uncontrolled but ok for age, pt to continue current medical treatment amaryl, actos   CKD (chronic kidney disease) stage 3, GFR 30-59 ml/min Lab Results  Component Value Date   CREATININE 1.74 (H) 11/18/2020   Stable overall, cont to avoid nephrotoxins   Pain and swelling of lower leg Most c/w venous insufficiency, cont lasix 20 qd prn, also for compression stockings, leg elevation, low salt, wt control, and bilateral venous dopplers  Followup: Return in about 8 weeks (around 01/14/2021).  Cathlean Cower, MD 11/24/2020 1:27 PM [facility]

## 2020-11-18 NOTE — Patient Instructions (Signed)
Please continue all other medications as before, and refills have been done if requested.  Please have the pharmacy call with any other refills you may need.  Please continue your efforts at being more active, low cholesterol diet, and weight control.  Please keep your appointments with your specialists as you may have planned  You will be contacted regarding the referral for: leg vein testing to make sure no blood clot is there (asap)  Please go to the LAB at the blood drawing area for the tests to be done - on the first floor now  You will be contacted by phone if any changes need to be made immediately.  Otherwise, you will receive a letter about your results with an explanation, but please check with MyChart first.  Please remember to sign up for MyChart if you have not done so, as this will be important to you in the future with finding out test results, communicating by private email, and scheduling acute appointments online when needed.  Please make an Appointment to return in 6 months, or sooner if needed, also with Lab Appointment for testing done 3-5 days before at the [place] (so this is for TWO appointments - please see the scheduling desk as you leave)  Due to the ongoing Covid 19 pandemic, our lab now requires an appointment for any labs done at our office.  If you need labs done and do not have an appointment, please call our office ahead of time to schedule before presenting to the lab for your testing.

## 2020-11-19 ENCOUNTER — Ambulatory Visit (HOSPITAL_COMMUNITY)
Admission: RE | Admit: 2020-11-19 | Discharge: 2020-11-19 | Disposition: A | Discharge status: Home / Self Care | Payer: Medicare Other | Source: Ambulatory Visit | Attending: Internal Medicine | Admitting: Internal Medicine

## 2020-11-19 ENCOUNTER — Encounter: Payer: Self-pay | Admitting: Internal Medicine

## 2020-11-19 DIAGNOSIS — M7989 Other specified soft tissue disorders: Secondary | ICD-10-CM | POA: Diagnosis not present

## 2020-11-19 DIAGNOSIS — M79669 Pain in unspecified lower leg: Secondary | ICD-10-CM | POA: Diagnosis not present

## 2020-11-19 LAB — D-DIMER, QUANTITATIVE: D-Dimer, Quant: 1.69 mcg/mL FEU — ABNORMAL HIGH (ref <0.50)

## 2020-11-19 NOTE — Progress Notes (Signed)
Patient ID: NIGIL BRAMAN, male    DOB: 1934/06/28, 86 y.o.   MRN: [id number]  Chief Complaint  Patient presents with   Follow-up    Pt states getting tired easier.    Referring provider: [name], MD  HPI:   ASIEL CHROSTOWSKI is a 86 y.o. man with reported "COPD" with recent spirometry without fixed obstruction but with significant bronchodilator response whom we are seeing in follow up for asthma, DOE.  Most recent PCP note reviewed.  Most recent hematology/oncology note reviewed.  Thinks breathing is a little off, may be a bit worse.  More fatigued than anything else.  Discussed with him and daughter on phone.  He is less active than he was before.  He has about a month and a half supply Breo left.  Discussed that breathing or fatigue could be related to deconditioning given he is less active.  Discussed increasing inhaler to Trelegy in the future if needed.  PFTs reviewed.  HPI at initial visit: Has had some mild dyspnea exertion going on some time now.  At least months to years.  He has been told he has COPD in the past but has never had spirometry.  Shortness of breath worse walking longer distances or on inclines.  Rest resolve symptoms.  No real shortness of breath at rest.  He has hayfever, seasonal allergies regularly.  Will use medicines to treat this when bad.  No recurrent pneumonias in the past no real bad bouts of bronchitis.  No asthma or exertional limitation as a child.  No syncope or presyncope.  Reportedly has occasionally received prednisone for some breathing difficulties in the past.  Is been at least 2 years or more since last use.  Spirometry recently demonstrates ratio at 68 to 69% but given his age this is over 90% predicted and within normal limits.  Significant bronchodilator response in both FEV1 and FVC.  CT scan 05/2019 personally reviewed and interpreted as diffuse significant upper lobe predominant emphysema without other significant abnormality.  PMH:  Diabetes, hypertension Surgical history: Cataracts, neck surgery/fusion next knee replacement, cholecystectomy Family history: No history of lung cancer Social history: Former smoker, quit late 80s, lives in [place] / Pulmonary Flowsheets:   ACT:  No flowsheet data found.  MMRC: mMRC Dyspnea Scale mMRC Score  11/18/2020 3  05/15/2020 0    Epworth:  No flowsheet data found.  Tests:   FENO:  No results found for: NITRICOXIDE  PFT: PFT Results Latest Ref Rng & Units 10/04/2019  FVC-Pre L 3.21  FVC-Predicted Pre % 78  FVC-Post L 3.67  FVC-Predicted Post % 90  Pre FEV1/FVC % % 68  Post FEV1/FCV % % 69  FEV1-Pre L 2.18  FEV1-Predicted Pre % 73  FEV1-Post L 2.55  DLCO uncorrected ml/min/mmHg 12.86  DLCO UNC% % 49  DLCO corrected ml/min/mmHg 12.94  DLCO COR %Predicted % 49  DLVA Predicted % 50  TLC L 7.34  TLC % Predicted % 95  RV % Predicted % 120    WALK:  No flowsheet data found.  Imaging: No results found.  Lab Results:  CBC    Component Value Date/Time   WBC 3.0 (L) 10/23/2020 1014   WBC 4.5 09/08/2020 2138   RBC 4.29 10/23/2020 1014   HGB 12.9 (L) 10/23/2020 1014   HCT 39.9 10/23/2020 1014   PLT 150 10/23/2020 1014   MCV 93.0 10/23/2020 1014   MCH 30.1 10/23/2020 1014   MCHC 32.3  10/23/2020 1014   RDW 14.1 10/23/2020 1014   LYMPHSABS 0.8 10/23/2020 1014   MONOABS 0.3 10/23/2020 1014   EOSABS 0.1 10/23/2020 1014   BASOSABS 0.0 10/23/2020 1014    BMET    Component Value Date/Time   NA 142 11/18/2020 1204   K 4.0 11/18/2020 1204   CL 104 11/18/2020 1204   CO2 29 11/18/2020 1204   GLUCOSE 125 (H) 11/18/2020 1204   BUN 35 (H) 11/18/2020 1204   CREATININE 1.74 (H) 11/18/2020 1204   CREATININE 2.06 (H) 10/23/2020 1014   CREATININE 1.68 (H) 10/04/2019 1115   CALCIUM 10.0 11/18/2020 1204   GFRNONAA 31 (L) 10/23/2020 1014   GFRNONAA 37 (L) 10/04/2019 1115   GFRAA 41 (L) 10/25/2019 1052   GFRAA 43 (L) 10/04/2019 1115    BNP     Component Value Date/Time   BNP 48.5 05/18/2019 1918    ProBNP No results found for: PROBNP  Specialty Problems       Pulmonary Problems   Allergic rhinitis    Qualifier: Diagnosis of  By: [name] MD, Hunt Oris       Acute upper respiratory infection   Dyspnea   Left-sided nosebleed   COPD (chronic obstructive pulmonary disease) (HCC)    Allergies  Allergen Reactions   Amlodipine Swelling   Cymbalta [Duloxetine Hcl]    Oxycodone     Immunization History  Administered Date(s) Administered   Fluad Quad(high Dose 65+) 11/09/2018, 11/30/2019   Influenza Whole 11/21/2015   Influenza, High Dose Seasonal PF 01/24/2013, 12/01/2016, 11/22/2017   Influenza,inj,Quad PF,6+ Mos 12/08/2013   Influenza-Unspecified 02/08/2015   PFIZER(Purple Top)SARS-COV-2 Vaccination 04/03/2019, 04/10/2019, 12/23/2019   Pneumococcal Conjugate-13 03/22/2013   Pneumococcal Polysaccharide-23 01/10/2016   Td 10/05/2014    Past Medical History:  Diagnosis Date   Anemia    Arthritis    Cataract    REMOVED BILATERAL   Colon polyps    DIABETES MELLITUS, TYPE II    takes Metformin and Glimepiride daily   Diverticulosis    GERD (gastroesophageal reflux disease)    takes Protonix daily   History of colon polyps    HYPERLIPIDEMIA    takes Atorvastatin daily   HYPERTENSION    takes Amlodipine and Hyzaar daily   Ileus following gastrointestinal surgery ([place]) 07/22/2012   Joint pain    Peripheral neuropathy    Peripheral neuropathy    PERIPHERAL NEUROPATHY, FEET 10/06/2007   Renal disorder    VITAMIN B12 DEFICIENCY 09/06/2008   VITAMIN D DEFICIENCY 01/16/2010   takes Vitamin D daily    Tobacco History: Social History   Tobacco Use  Smoking Status Former   Packs/day: 0.00   Types: Cigarettes  Smokeless Tobacco Never  Tobacco Comments   quit smoking around 1990   Counseling given: Not Answered Tobacco comments: quit smoking around [place] to not smoke  Outpatient Encounter  Medications as of 11/18/2020  Medication Sig   aspirin 81 MG EC tablet Take 81 mg by mouth daily.   atorvastatin (LIPITOR) 40 MG tablet TAKE 1 TABLET BY MOUTH  DAILY   carvedilol (COREG) 12.5 MG tablet TAKE 1 TABLET BY MOUTH  TWICE DAILY WITH MEALS   Cholecalciferol (VITAMIN D3) 1000 UNITS CAPS Take 1 capsule (1,000 Units total) by mouth daily.   doxazosin (CARDURA) 2 MG tablet Take 1 tablet (2 mg total) by mouth daily.   finasteride (PROSCAR) 5 MG tablet Take 5 mg by mouth daily.   fluticasone furoate-vilanterol (BREO ELLIPTA) 200-25 MCG/INH  AEPB Inhale 1 puff into the lungs daily.   furosemide (LASIX) 20 MG tablet Take 20 mg by mouth daily.   glimepiride (AMARYL) 2 MG tablet TAKE 2 TABLETS BY MOUTH  DAILY BEFORE BREAKFAST   glucose blood (ONE TOUCH ULTRA TEST) test strip Use as instructed E11.9   losartan (COZAAR) 100 MG tablet TAKE 1 TABLET BY MOUTH  DAILY   Multiple Vitamins-Minerals (MULTIVITAMIN PO) Take 1 tablet by mouth daily.   pantoprazole (PROTONIX) 40 MG tablet TAKE 1 TABLET BY MOUTH  DAILY   pioglitazone (ACTOS) 15 MG tablet Take 1 tablet (15 mg total) by mouth daily.   tamsulosin (FLOMAX) 0.4 MG CAPS capsule TAKE 1 CAPSULE BY MOUTH  DAILY   No facility-administered encounter medications on file as of 11/18/2020.     Review of Systems  Review of Systems  N/a Physical Exam  BP (!) 150/80 (BP Location: Left Arm, Cuff Size: Normal)   Pulse 89   Temp 97.7 F (36.5 C) (Oral)   Ht _0  (1.88 m)   Wt 186 lb 12.8 oz (84.7 kg)   SpO2 94%   BMI 23.98 kg/m   Wt Readings from Last 5 Encounters:  11/18/20 186 lb 12.8 oz (84.7 kg)  11/18/20 186 lb (84.4 kg)  10/30/20 187 lb 8 oz (85 kg)  10/04/20 186 lb (84.4 kg)  09/12/20 184 lb 3.2 oz (83.6 kg)    BMI Readings from Last 5 Encounters:  11/18/20 23.98 kg/m  11/18/20 23.88 kg/m  10/30/20 24.07 kg/m  10/04/20 23.88 kg/m  09/12/20 23.65 kg/m     Physical Exam General: Well-appearing, in no acute distress Eyes:  EOMI, no icterus Neck: Supple, no JVP appreciated sitting upright Respiratory: Clear to auscultation bilaterally, normal work of breathing on room air Abdomen: Soft, bowel sounds present Extremities: Warm, no edema MSK: No synovitis, no joint effusions Neuro: Normal gait, no weakness Psych: Normal mood, full affect   Assessment & Plan:   INEZ ROSATO is a 86 y.o. man with reported COPD with recent spirometry without fixed obstruction but with significant bronchodilator response whom we are seeing for follow up for evaluation of DOE.  DOE: PFTs are suggestive of asthma and this is likely a driver of symptoms.  Some of deconditioning given he admits to being less active.  Improved with ICS/LABA therapy but still present. Additionally, DLCO is severely reduced which is related to emphysema seen on CT but also concern for contribution of pulmonary HTN given elevated PA pressures seen on TTE in 2016. Repeat TTE 10/2019 with mildly elevated PASP, RV enlargement but normal RV function and size.  Also developing element of deconditioning given his lack of activity in the preceding months.  Asked him to gradually increase his activity.  If no better in 4 weeks we will plan to call in Trelegy to escalate from [facility].  If not improving at next visit, consider repeat TTE.  Asthma: Had improved with ICS/LABA.  Encouraged increased exercise.  Escalate to Trelegy if not improving in the coming weeks.   Return in about 3 months (around 02/17/2021).   Lanier Clam, MD 11/19/2020

## 2020-11-20 ENCOUNTER — Other Ambulatory Visit: Payer: Self-pay

## 2020-11-20 ENCOUNTER — Encounter: Payer: Self-pay | Admitting: Podiatry

## 2020-11-20 ENCOUNTER — Ambulatory Visit (INDEPENDENT_AMBULATORY_CARE_PROVIDER_SITE_OTHER): Payer: Medicare Other | Admitting: Podiatry

## 2020-11-20 DIAGNOSIS — B351 Tinea unguium: Secondary | ICD-10-CM

## 2020-11-20 DIAGNOSIS — L84 Corns and callosities: Secondary | ICD-10-CM

## 2020-11-20 DIAGNOSIS — M79675 Pain in left toe(s): Secondary | ICD-10-CM

## 2020-11-20 DIAGNOSIS — E1142 Type 2 diabetes mellitus with diabetic polyneuropathy: Secondary | ICD-10-CM | POA: Diagnosis not present

## 2020-11-20 DIAGNOSIS — M79674 Pain in right toe(s): Secondary | ICD-10-CM | POA: Diagnosis not present

## 2020-11-24 ENCOUNTER — Encounter: Payer: Self-pay | Admitting: Internal Medicine

## 2020-11-24 DIAGNOSIS — M7989 Other specified soft tissue disorders: Secondary | ICD-10-CM | POA: Insufficient documentation

## 2020-11-24 DIAGNOSIS — M79669 Pain in unspecified lower leg: Secondary | ICD-10-CM | POA: Insufficient documentation

## 2020-11-24 NOTE — Assessment & Plan Note (Addendum)
BP Readings from Last 3 Encounters:  11/18/20 (!) 150/80  11/18/20 (!) 168/70  10/30/20 (!) 150/59   uncontrolled, pt to continue medical treatment coreg, cardura, losartan as declines change today

## 2020-11-24 NOTE — Assessment & Plan Note (Signed)
Lab Results  Component Value Date   CREATININE 1.74 (H) 11/18/2020   Stable overall, cont to avoid nephrotoxins

## 2020-11-24 NOTE — Assessment & Plan Note (Signed)
Last vitamin D Lab Results  Component Value Date   VD25OH 36 10/04/2019   Low normal, to start oral replacement

## 2020-11-24 NOTE — Assessment & Plan Note (Signed)
Lab Results  Component Value Date   HGBA1C 7.3 (H) 11/18/2020   Mild uncontrolled but ok for age, pt to continue current medical treatment amaryl, actos

## 2020-11-24 NOTE — Assessment & Plan Note (Signed)
Lab Results  Component Value Date   LDLCALC 66 11/18/2020   Stable, pt to continue current statin lipitor 40

## 2020-11-24 NOTE — Assessment & Plan Note (Signed)
Most c/w venous insufficiency, cont lasix 20 qd prn, also for compression stockings, leg elevation, low salt, wt control, and bilateral venous dopplers

## 2020-11-25 NOTE — Progress Notes (Signed)
  Subjective:  Patient ID: Nicholas Patton, male    DOB: Jul 07, 1934,  MRN: [id number]  Nicholas Patton presents to clinic today for at risk foot care with history of diabetic neuropathy and corn(s) left foot , callus(es) right foot and painful mycotic nails.  Pain interferes with ambulation. Aggravating factors include wearing enclosed shoe gear. Painful toenails interfere with ambulation. Aggravating factors include wearing enclosed shoe gear. Pain is relieved with periodic professional debridement. Painful corns and calluses are aggravated when weightbearing with and without shoegear. Pain is relieved with periodic professional debridement.  Patient states blood glucose was 99 mg/dl today. Last A1c was 7.3%.  PCP is [name], MD , and last visit was 11/18/2020.  He states he had testing done to rule out blood clot in his lower extremities.  Allergies  Allergen Reactions   Amlodipine Swelling   Cymbalta [Duloxetine Hcl]    Oxycodone     Review of Systems: Negative except as noted in the HPI. Objective:   Constitutional Nicholas Patton is a pleasant 86 y.o. African American male, WD, WN in NAD. AAO x 3.   Vascular Capillary refill time to digits immediate b/l. Palpable DP pulse(s) b/l lower extremities Palpable PT pulse(s) b/l lower extremities Pedal hair absent. Lower extremity skin temperature gradient within normal limits. No pain with calf compression b/l. Nonpitting edema noted right lower extremity. No cyanosis or clubbing noted.  Neurologic Normal speech. Oriented to person, place, and time. Pt has subjective symptoms of neuropathy. Protective sensation intact 5/5 intact bilaterally with 10g monofilament b/l. Vibratory sensation intact b/l.  Dermatologic Skin warm and supple b/l lower extremities. No open wounds b/l lower extremities. No interdigital macerations b/l lower extremities. Toenails 1-5 b/l elongated, discolored, dystrophic, thickened, crumbly with subungual debris and  tenderness to dorsal palpation. Hyperkeratotic lesion(s) L hallux, L 2nd toe, and R hallux.  No erythema, no edema, no drainage, no fluctuance.  Orthopedic: Normal muscle strength 5/5 to all lower extremity muscle groups bilaterally. Hallux valgus with bunion deformity noted b/l lower extremities. Hammertoe(s) noted to the b/l lower extremities. Patient ambulates independent of any assistive aids.   Radiographs: None Assessment:   1. Pain due to onychomycosis of toenails of both feet   2. Corns and callosities   3. Diabetic peripheral neuropathy associated with type 2 diabetes mellitus ([place])    Plan:  Patient was evaluated and treated and all questions answered. Consent given for treatment as described below: -Examined patient. -Continue diabetic foot care principles: inspect feet daily, monitor glucose as recommended by PCP and/or Endocrinologist, and follow prescribed diet per PCP, Endocrinologist and/or dietician. -Patient to continue soft, supportive shoe gear daily. -Toenails 1-5 b/l were debrided in length and girth with sterile nail nippers and dremel without iatrogenic bleeding.  -Corn(s) L hallux and L 2nd toe and callus(es) R hallux were pared utilizing sterile scalpel blade without incident. Total number debrided =3. -Patient to report any pedal injuries to medical professional immediately. -Patient/POA to call should there be question/concern in the interim.  Return in about 3 months (around 02/19/2021).  Marzetta Board, DPM

## 2020-11-28 DIAGNOSIS — L304 Erythema intertrigo: Secondary | ICD-10-CM | POA: Diagnosis not present

## 2020-11-28 DIAGNOSIS — R202 Paresthesia of skin: Secondary | ICD-10-CM | POA: Diagnosis not present

## 2020-12-02 DIAGNOSIS — R972 Elevated prostate specific antigen [PSA]: Secondary | ICD-10-CM | POA: Diagnosis not present

## 2020-12-05 ENCOUNTER — Encounter: Payer: Self-pay | Admitting: Internal Medicine

## 2020-12-05 NOTE — Telephone Encounter (Signed)
Hello Dr. Silas Flood, please advise on mychart message below, thanks  Dr. Silas Flood,  I called [facility] and the cost of the medicine Terlegy  is 425.00 for 90 days.  The cost of Memory Dance has gone up also, its 330.00 for 90 days.  I have 30 day supply let on Breo.   You asked that I call you before reordering the [facility].    What is the next course of action for me?     Thanks [employer]

## 2020-12-05 NOTE — Telephone Encounter (Signed)
Is his breathing any better or still the same as last visit? We discussed that if breathing was the same and overall worse than before we would increase to Trelegy. We could investigate cost for [facility] and if cheaper choose that.

## 2020-12-09 DIAGNOSIS — R351 Nocturia: Secondary | ICD-10-CM | POA: Diagnosis not present

## 2020-12-09 DIAGNOSIS — N401 Enlarged prostate with lower urinary tract symptoms: Secondary | ICD-10-CM | POA: Diagnosis not present

## 2020-12-09 DIAGNOSIS — R972 Elevated prostate specific antigen [PSA]: Secondary | ICD-10-CM | POA: Diagnosis not present

## 2020-12-09 DIAGNOSIS — R310 Gross hematuria: Secondary | ICD-10-CM | POA: Diagnosis not present

## 2020-12-14 ENCOUNTER — Ambulatory Visit (INDEPENDENT_AMBULATORY_CARE_PROVIDER_SITE_OTHER): Payer: Medicare Other

## 2020-12-14 DIAGNOSIS — Z23 Encounter for immunization: Secondary | ICD-10-CM

## 2020-12-16 DIAGNOSIS — M7582 Other shoulder lesions, left shoulder: Secondary | ICD-10-CM | POA: Diagnosis not present

## 2020-12-16 DIAGNOSIS — M5030 Other cervical disc degeneration, unspecified cervical region: Secondary | ICD-10-CM | POA: Diagnosis not present

## 2020-12-23 DIAGNOSIS — M47892 Other spondylosis, cervical region: Secondary | ICD-10-CM | POA: Diagnosis not present

## 2020-12-23 DIAGNOSIS — S43422D Sprain of left rotator cuff capsule, subsequent encounter: Secondary | ICD-10-CM | POA: Diagnosis not present

## 2020-12-23 DIAGNOSIS — M5412 Radiculopathy, cervical region: Secondary | ICD-10-CM | POA: Diagnosis not present

## 2020-12-23 DIAGNOSIS — M25612 Stiffness of left shoulder, not elsewhere classified: Secondary | ICD-10-CM | POA: Diagnosis not present

## 2020-12-25 ENCOUNTER — Other Ambulatory Visit: Payer: Self-pay | Admitting: Internal Medicine

## 2020-12-25 NOTE — Telephone Encounter (Signed)
Please refill as per office routine med refill policy (all routine meds to be refilled for 3 mo or monthly (per pt preference) up to one year from last visit, then month to month grace period for 3 mo, then further med refills will have to be denied)

## 2020-12-26 ENCOUNTER — Ambulatory Visit (INDEPENDENT_AMBULATORY_CARE_PROVIDER_SITE_OTHER): Payer: Medicare Other | Admitting: Pulmonary Disease

## 2020-12-26 ENCOUNTER — Other Ambulatory Visit: Payer: Self-pay

## 2020-12-26 ENCOUNTER — Encounter: Payer: Self-pay | Admitting: Pulmonary Disease

## 2020-12-26 VITALS — BP 130/80 | HR 56 | Temp 97.7°F | Ht 74.0 in | Wt 189.0 lb

## 2020-12-26 DIAGNOSIS — J4530 Mild persistent asthma, uncomplicated: Secondary | ICD-10-CM | POA: Diagnosis not present

## 2020-12-26 DIAGNOSIS — R0600 Dyspnea, unspecified: Secondary | ICD-10-CM

## 2020-12-26 MED ORDER — TRELEGY ELLIPTA 200-62.5-25 MCG/ACT IN AEPB: 1.0000 | INHALATION_SPRAY | Freq: Every day | RESPIRATORY_TRACT | 0 refills | Status: DC

## 2020-12-26 NOTE — Patient Instructions (Addendum)
Nice to see you  Use Trelegy 1 puff daily, stop Breo, this replaces Breo.  Rinse her mouth out after every use  I provided a 4-week supply via samples.  Please send me a message in the next 3 to 4 weeks and let me know if it is helpful in terms of your shortness of breath.  If not, I will order ultrasound and we will await the results of that to determine next steps and further evaluation of your breathing.  Return to clinic in 3 months or sooner as needed with Dr. Silas Flood

## 2020-12-26 NOTE — Progress Notes (Signed)
Patient ID: Nicholas Patton, male    DOB: [birth year], 86 y.o.   MRN: [id number]  Chief Complaint  Patient presents with   Follow-up    Patient reports that he is about the same and worse with exertion.     Referring provider: [name], MD  HPI:   Nicholas Patton is a 86 y.o. man with reported "COPD" with recent spirometry without fixed obstruction but with significant bronchodilator response whom we are seeing in follow up for asthma, DOE.    Recently felt breathing was off. Breo less effective. Prescribed Trelegy. Seemed cost was an issue.  Did not obtain.  Continue Breo.  Breathing no different, no better or worse.  Walking 3 times a week.  A little less now that is cold.  Unsure if it is helped his dyspnea.  Reports intermittent, usually once daily or less albuterol use with improved symptoms.  PFTs reviewed.  HPI at initial visit: Has had some mild dyspnea exertion going on some time now.  At least months to years.  He has been told he has COPD in the past but has never had spirometry.  Shortness of breath worse walking longer distances or on inclines.  Rest resolve symptoms.  No real shortness of breath at rest.  He has hayfever, seasonal allergies regularly.  Will use medicines to treat this when bad.  No recurrent pneumonias in the past no real bad bouts of bronchitis.  No asthma or exertional limitation as a child.  No syncope or presyncope.  Reportedly has occasionally received prednisone for some breathing difficulties in the past.  Is been at least 2 years or more since last use.  Spirometry recently demonstrates ratio at 68 to 69% but given his age this is over 90% predicted and within normal limits.  Significant bronchodilator response in both FEV1 and FVC.  CT scan 05/2019 personally reviewed and interpreted as diffuse significant upper lobe predominant emphysema without other significant abnormality.  PMH: Diabetes, hypertension Surgical history: Cataracts, neck  surgery/fusion next knee replacement, cholecystectomy Family history: No history of lung cancer Social history: Former smoker, quit late 80s, lives in [place] / Pulmonary Flowsheets:   ACT:  No flowsheet data found.  MMRC: mMRC Dyspnea Scale mMRC Score  11/18/2020 3  05/15/2020 0    Epworth:  No flowsheet data found.  Tests:   FENO:  No results found for: NITRICOXIDE  PFT: PFT Results Latest Ref Rng & Units 10/04/2019  FVC-Pre L 3.21  FVC-Predicted Pre % 78  FVC-Post L 3.67  FVC-Predicted Post % 90  Pre FEV1/FVC % % 68  Post FEV1/FCV % % 69  FEV1-Pre L 2.18  FEV1-Predicted Pre % 73  FEV1-Post L 2.55  DLCO uncorrected ml/min/mmHg 12.86  DLCO UNC% % 49  DLCO corrected ml/min/mmHg 12.94  DLCO COR %Predicted % 49  DLVA Predicted % 50  TLC L 7.34  TLC % Predicted % 95  RV % Predicted % 120  Personally reviewed and interpreted as normal spirometry with significant bronchodilator response.  TLC within normal limits, RV gas trapping.  DLCO severely reduced.  WALK:  No flowsheet data found.  Imaging: No results found.  Lab Results:  CBC    Component Value Date/Time   WBC 3.0 (L) 10/23/2020 1014   WBC 4.5 09/08/2020 2138   RBC 4.29 10/23/2020 1014   HGB 12.9 (L) 10/23/2020 1014   HCT 39.9 10/23/2020 1014   PLT 150 10/23/2020 1014   MCV 93.0  10/23/2020 1014   MCH 30.1 10/23/2020 1014   MCHC 32.3 10/23/2020 1014   RDW 14.1 10/23/2020 1014   LYMPHSABS 0.8 10/23/2020 1014   MONOABS 0.3 10/23/2020 1014   EOSABS 0.1 10/23/2020 1014   BASOSABS 0.0 10/23/2020 1014    BMET    Component Value Date/Time   NA 142 11/18/2020 1204   K 4.0 11/18/2020 1204   CL 104 11/18/2020 1204   CO2 29 11/18/2020 1204   GLUCOSE 125 (H) 11/18/2020 1204   BUN 35 (H) 11/18/2020 1204   CREATININE 1.74 (H) 11/18/2020 1204   CREATININE 2.06 (H) 10/23/2020 1014   CREATININE 1.68 (H) 10/04/2019 1115   CALCIUM 10.0 11/18/2020 1204   GFRNONAA 31 (L) 10/23/2020 1014    GFRNONAA 37 (L) 10/04/2019 1115   GFRAA 41 (L) 10/25/2019 1052   GFRAA 43 (L) 10/04/2019 1115    BNP    Component Value Date/Time   BNP 48.5 05/18/2019 1918    ProBNP No results found for: PROBNP  Specialty Problems       Pulmonary Problems   Allergic rhinitis    Qualifier: Diagnosis of  By: [name] MD, Hunt Oris       Acute upper respiratory infection   Dyspnea   Left-sided nosebleed   COPD (chronic obstructive pulmonary disease) (HCC)    Allergies  Allergen Reactions   Amlodipine Swelling   Cymbalta [Duloxetine Hcl] Other (See Comments)   Oxycodone Other (See Comments)    Immunization History  Administered Date(s) Administered   Fluad Quad(high Dose 65+) 11/09/2018, 11/30/2019, 12/14/2020   Influenza Whole 11/21/2015   Influenza, High Dose Seasonal PF 01/24/2013, 12/01/2016, 11/22/2017   Influenza,inj,Quad PF,6+ Mos 12/08/2013   Influenza-Unspecified 02/08/2015   PFIZER(Purple Top)SARS-COV-2 Vaccination 04/03/2019, 04/10/2019, 12/23/2019, 10/10/2020   Pneumococcal Conjugate-13 03/22/2013   Pneumococcal Polysaccharide-23 01/10/2016   Td 10/05/2014    Past Medical History:  Diagnosis Date   Anemia    Arthritis    Cataract    REMOVED BILATERAL   Colon polyps    DIABETES MELLITUS, TYPE II    takes Metformin and Glimepiride daily   Diverticulosis    GERD (gastroesophageal reflux disease)    takes Protonix daily   History of colon polyps    HYPERLIPIDEMIA    takes Atorvastatin daily   HYPERTENSION    takes Amlodipine and Hyzaar daily   Ileus following gastrointestinal surgery ([place]) 07/22/2012   Joint pain    Peripheral neuropathy    Peripheral neuropathy    PERIPHERAL NEUROPATHY, FEET 10/06/2007   Renal disorder    VITAMIN B12 DEFICIENCY 09/06/2008   VITAMIN D DEFICIENCY 01/16/2010   takes Vitamin D daily    Tobacco History: Social History   Tobacco Use  Smoking Status Former   Packs/day: 0.00   Types: Cigarettes  Smokeless Tobacco Never   Tobacco Comments   quit smoking around [place] given: Not Answered Tobacco comments: quit smoking around [place] to not smoke  Outpatient Encounter Medications as of 12/26/2020  Medication Sig   aspirin 81 MG EC tablet Take 81 mg by mouth daily.   atorvastatin (LIPITOR) 40 MG tablet TAKE 1 TABLET BY MOUTH  DAILY   carvedilol (COREG) 12.5 MG tablet TAKE 1 TABLET BY MOUTH  TWICE DAILY WITH MEALS   Cholecalciferol (VITAMIN D3) 1000 UNITS CAPS Take 1 capsule (1,000 Units total) by mouth daily.   doxazosin (CARDURA) 2 MG tablet Take 1 tablet (2 mg total) by mouth daily.   finasteride (  PROSCAR) 5 MG tablet Take 5 mg by mouth daily.   fluticasone furoate-vilanterol (BREO ELLIPTA) 200-25 MCG/INH AEPB Inhale 1 puff into the lungs daily.   furosemide (LASIX) 20 MG tablet Take 20 mg by mouth daily.   glimepiride (AMARYL) 2 MG tablet TAKE 2 TABLETS BY MOUTH  DAILY BEFORE BREAKFAST   glucose blood (ONE TOUCH ULTRA TEST) test strip Use as instructed E11.9   losartan (COZAAR) 100 MG tablet TAKE 1 TABLET BY MOUTH  DAILY   Multiple Vitamins-Minerals (MULTIVITAMIN PO) Take 1 tablet by mouth daily.   pantoprazole (PROTONIX) 40 MG tablet TAKE 1 TABLET BY MOUTH  DAILY   PAXIL 10 MG tablet Take 10 mg by mouth daily.   pioglitazone (ACTOS) 15 MG tablet Take 1 tablet (15 mg total) by mouth daily.   tamsulosin (FLOMAX) 0.4 MG CAPS capsule TAKE 1 CAPSULE BY MOUTH  DAILY   No facility-administered encounter medications on file as of 12/26/2020.     Review of Systems  Review of Systems  N/a Physical Exam  BP 130/80 (BP Location: Left Arm, Patient Position: Sitting, Cuff Size: Normal)   Pulse (!) 56   Temp 97.7 F (36.5 C) (Oral)   Ht _0  (1.88 m)   Wt 189 lb (85.7 kg)   SpO2 97%   BMI 24.27 kg/m   Wt Readings from Last 5 Encounters:  12/26/20 189 lb (85.7 kg)  11/18/20 186 lb 12.8 oz (84.7 kg)  11/18/20 186 lb (84.4 kg)  10/30/20 187 lb 8 oz (85 kg)  10/04/20 186 lb  (84.4 kg)    BMI Readings from Last 5 Encounters:  12/26/20 24.27 kg/m  11/18/20 23.98 kg/m  11/18/20 23.88 kg/m  10/30/20 24.07 kg/m  10/04/20 23.88 kg/m     Physical Exam General: Well-appearing, in no acute distress Eyes: EOMI, no icterus Neck: Supple, no JVP appreciated sitting upright Respiratory: Clear to auscultation bilaterally, normal work of breathing on room air Abdomen: Soft, bowel sounds present Extremities: Warm, no edema MSK: No synovitis, no joint effusions Neuro: Normal gait, no weakness Psych: Normal mood, full affect   Assessment & Plan:   Nicholas Patton is a 86 y.o. man with reported COPD with recent spirometry without fixed obstruction but with significant bronchodilator response whom we are seeing for follow up for evaluation of DOE.  DOE: PFTs are suggestive of asthma and this is likely a driver of symptoms.  Some of deconditioning given he admits to being less active.  Improved with ICS/LABA therapy but still present. Additionally, DLCO is severely reduced which is related to emphysema seen on CT but also concern for contribution of pulmonary HTN given elevated PA pressures seen on TTE in 2016. Repeat TTE 10/2019 with mildly elevated PASP, RV enlargement but normal RV function and size.  Also developing element of deconditioning given his lack of activity in the preceding months.  Asked him to gradually increase his activity.  Escalated to Trelegy as below.  If not improved in 3 to 4 weeks anticipate repeat TTE.  It is possible that symptoms are related to worsening pulmonary hypertension.  He is fit, well and likely would tolerate trial of pulmonary vasodilators if felt needed.  Improved with albuterol argues for uncontrolled asthma.  Asthma: Had improved with ICS/LABA.  Encouraged increased exercise.  Breo no longer as effective.  Trelegy therapy delayed given insurance/cost issues.  4-week supply provided in clinic today.  Contact us to let us know how  things are going in the coming  3 to 4 weeks   Return in about 3 months (around 03/28/2021).   Lanier Clam, MD 12/26/2020

## 2020-12-27 DIAGNOSIS — M5412 Radiculopathy, cervical region: Secondary | ICD-10-CM | POA: Diagnosis not present

## 2020-12-27 DIAGNOSIS — M47892 Other spondylosis, cervical region: Secondary | ICD-10-CM | POA: Diagnosis not present

## 2020-12-27 DIAGNOSIS — S43422D Sprain of left rotator cuff capsule, subsequent encounter: Secondary | ICD-10-CM | POA: Diagnosis not present

## 2020-12-27 DIAGNOSIS — M25612 Stiffness of left shoulder, not elsewhere classified: Secondary | ICD-10-CM | POA: Diagnosis not present

## 2020-12-31 DIAGNOSIS — M47892 Other spondylosis, cervical region: Secondary | ICD-10-CM | POA: Diagnosis not present

## 2020-12-31 DIAGNOSIS — S43422D Sprain of left rotator cuff capsule, subsequent encounter: Secondary | ICD-10-CM | POA: Diagnosis not present

## 2020-12-31 DIAGNOSIS — M25612 Stiffness of left shoulder, not elsewhere classified: Secondary | ICD-10-CM | POA: Diagnosis not present

## 2020-12-31 DIAGNOSIS — M5412 Radiculopathy, cervical region: Secondary | ICD-10-CM | POA: Diagnosis not present

## 2021-01-02 DIAGNOSIS — S43422D Sprain of left rotator cuff capsule, subsequent encounter: Secondary | ICD-10-CM | POA: Diagnosis not present

## 2021-01-02 DIAGNOSIS — M25612 Stiffness of left shoulder, not elsewhere classified: Secondary | ICD-10-CM | POA: Diagnosis not present

## 2021-01-02 DIAGNOSIS — M5412 Radiculopathy, cervical region: Secondary | ICD-10-CM | POA: Diagnosis not present

## 2021-01-02 DIAGNOSIS — M47892 Other spondylosis, cervical region: Secondary | ICD-10-CM | POA: Diagnosis not present

## 2021-01-03 ENCOUNTER — Other Ambulatory Visit: Payer: Self-pay | Admitting: Sports Medicine

## 2021-01-03 DIAGNOSIS — M7582 Other shoulder lesions, left shoulder: Secondary | ICD-10-CM | POA: Diagnosis not present

## 2021-01-03 DIAGNOSIS — M25512 Pain in left shoulder: Secondary | ICD-10-CM

## 2021-01-14 ENCOUNTER — Encounter: Payer: Self-pay | Admitting: Internal Medicine

## 2021-01-14 ENCOUNTER — Other Ambulatory Visit: Payer: Self-pay

## 2021-01-14 ENCOUNTER — Ambulatory Visit (INDEPENDENT_AMBULATORY_CARE_PROVIDER_SITE_OTHER): Payer: Medicare Other

## 2021-01-14 ENCOUNTER — Ambulatory Visit (INDEPENDENT_AMBULATORY_CARE_PROVIDER_SITE_OTHER): Payer: Medicare Other | Admitting: Internal Medicine

## 2021-01-14 VITALS — BP 138/72 | HR 56 | Temp 97.8°F | Ht 74.0 in | Wt 189.6 lb

## 2021-01-14 DIAGNOSIS — E559 Vitamin D deficiency, unspecified: Secondary | ICD-10-CM | POA: Diagnosis not present

## 2021-01-14 DIAGNOSIS — E538 Deficiency of other specified B group vitamins: Secondary | ICD-10-CM | POA: Diagnosis not present

## 2021-01-14 DIAGNOSIS — R051 Acute cough: Secondary | ICD-10-CM | POA: Diagnosis not present

## 2021-01-14 DIAGNOSIS — E1140 Type 2 diabetes mellitus with diabetic neuropathy, unspecified: Secondary | ICD-10-CM | POA: Diagnosis not present

## 2021-01-14 DIAGNOSIS — R059 Cough, unspecified: Secondary | ICD-10-CM | POA: Diagnosis not present

## 2021-01-14 DIAGNOSIS — N1832 Chronic kidney disease, stage 3b: Secondary | ICD-10-CM | POA: Diagnosis not present

## 2021-01-14 DIAGNOSIS — M7989 Other specified soft tissue disorders: Secondary | ICD-10-CM | POA: Diagnosis not present

## 2021-01-14 DIAGNOSIS — M79661 Pain in right lower leg: Secondary | ICD-10-CM | POA: Diagnosis not present

## 2021-01-14 LAB — HEMOGLOBIN A1C: Hgb A1c MFr Bld: 7.5 % — ABNORMAL HIGH (ref 4.6–6.5)

## 2021-01-14 LAB — BASIC METABOLIC PANEL
BUN: 35 mg/dL — ABNORMAL HIGH (ref 6–23)
CO2: 28 mEq/L (ref 19–32)
Calcium: 10.1 mg/dL (ref 8.4–10.5)
Chloride: 104 mEq/L (ref 96–112)
Creatinine, Ser: 1.77 mg/dL — ABNORMAL HIGH (ref 0.40–1.50)
GFR: 34.84 mL/min — ABNORMAL LOW (ref >60.00)
Glucose, Bld: 128 mg/dL — ABNORMAL HIGH (ref 70–99)
Potassium: 4 mEq/L (ref 3.5–5.1)
Sodium: 141 mEq/L (ref 135–145)

## 2021-01-14 LAB — LIPID PANEL
Cholesterol: 132 mg/dL (ref 0–200)
HDL: 56.5 mg/dL (ref >39.00)
LDL Cholesterol: 62 mg/dL (ref 0–99)
NonHDL: 75.28
Total CHOL/HDL Ratio: 2
Triglycerides: 65 mg/dL (ref 0.0–149.0)
VLDL: 13 mg/dL (ref 0.0–40.0)

## 2021-01-14 LAB — HEPATIC FUNCTION PANEL
ALT: 18 U/L (ref 0–53)
AST: 24 U/L (ref 0–37)
Albumin: 4.4 g/dL (ref 3.5–5.2)
Alkaline Phosphatase: 91 U/L (ref 39–117)
Bilirubin, Direct: 0.1 mg/dL (ref 0.0–0.3)
Total Bilirubin: 0.6 mg/dL (ref 0.2–1.2)
Total Protein: 6.9 g/dL (ref 6.0–8.3)

## 2021-01-14 LAB — VITAMIN D 25 HYDROXY (VIT D DEFICIENCY, FRACTURES): VITD: 41.4 ng/mL (ref 30.00–100.00)

## 2021-01-14 LAB — VITAMIN B12: Vitamin B-12: 374 pg/mL (ref 211–911)

## 2021-01-14 NOTE — Patient Instructions (Signed)
Please continue all other medications as before, and refills have been done if requested.  Please have the pharmacy call with any other refills you may need.  Please continue your efforts at being more active, low cholesterol diet, and weight control.  Please keep your appointments with your specialists as you may have planned  Please go to the XRAY Department in the first floor for the x-ray testing  Please go to the LAB at the blood drawing area for the tests to be done  You will be contacted by phone if any changes need to be made immediately.  Otherwise, you will receive a letter about your results with an explanation, but please check with MyChart first.  Please remember to sign up for MyChart if you have not done so, as this will be important to you in the future with finding out test results, communicating by private email, and scheduling acute appointments online when needed.  Please make an Appointment to return in 6 months, or sooner if needed

## 2021-01-14 NOTE — Progress Notes (Signed)
Patient ID: Nicholas Patton, male   DOB: [birth year], 86 y.o.   MRN: [id number]        Chief Complaint: follow up cough, copd, Dm, ckd.       HPI:  Nicholas Patton is a 86 y.o. male here overall doing ok; Pt denies chest pain, increased sob or doe, wheezing, orthopnea, PND, increased LE swelling, palpitations, dizziness or syncope.   Pt denies polydipsia, polyuria, or low sugars symptom though did have a low sugar of 70 last wk that had him eating to improve.  Now on trelegy for only about 2 wks and he's not sure if better yet than when taking breo  Does have  a scant prod cough worse in the past wk for unclear reason, though has some nasal congestoin as well.  Denies worsening reflux, abd pain, dysphagia, n/v, bowel change or blood.  Pt denies chest pain, increased sob or doe, wheezing, orthopnea, PND, increased LE swelling, palpitations, dizziness or syncope.Did also see renal fairly recent, states told he was stable and to f/u at 6 mo.       Wt Readings from Last 3 Encounters:  01/14/21 189 lb 9.6 oz (86 kg)  12/26/20 189 lb (85.7 kg)  11/18/20 186 lb 12.8 oz (84.7 kg)   BP Readings from Last 3 Encounters:  01/14/21 138/72  12/26/20 130/80  11/18/20 (!) 150/80         Past Medical History:  Diagnosis Date   Anemia    Arthritis    Cataract    REMOVED BILATERAL   Colon polyps    DIABETES MELLITUS, TYPE II    takes Metformin and Glimepiride daily   Diverticulosis    GERD (gastroesophageal reflux disease)    takes Protonix daily   History of colon polyps    HYPERLIPIDEMIA    takes Atorvastatin daily   HYPERTENSION    takes Amlodipine and Hyzaar daily   Ileus following gastrointestinal surgery ([place]) 07/22/2012   Joint pain    Peripheral neuropathy    Peripheral neuropathy    PERIPHERAL NEUROPATHY, FEET 10/06/2007   Renal disorder    VITAMIN B12 DEFICIENCY 09/06/2008   VITAMIN D DEFICIENCY 01/16/2010   takes Vitamin D daily   Past Surgical History:  Procedure Laterality Date    CATARACT EXTRACTION     both eyes   CHOLECYSTECTOMY N/A 07/18/2012   Procedure: LAPAROSCOPIC CHOLECYSTECTOMY WITH INTRAOPERATIVE CHOLANGIOGRAM;  Surgeon: Zenovia Jarred, MD;  Location: [facility];  Service: General;  Laterality: N/A;   COLONOSCOPY     POLYPECTOMY     POSTERIOR CERVICAL FUSION/FORAMINOTOMY N/A 06/27/2015   Procedure: Posterior Cervical Fusion with lateral mass fixation Cervical four to cervical seven;  Surgeon: Eustace Moore, MD;  Location: [facility];  Service: Neurosurgery;  Laterality: N/A;   TOTAL KNEE ARTHROPLASTY  2001   Left   TURP VAPORIZATION      reports that he has quit smoking. His smoking use included cigarettes. He has never used smokeless tobacco. He reports that he does not drink alcohol and does not use drugs. family history includes Diabetes Mellitus II in his mother; Lung cancer in his brother. Allergies  Allergen Reactions   Amlodipine Swelling   Cymbalta [Duloxetine Hcl] Other (See Comments)   Oxycodone Other (See Comments)   Current Outpatient Medications on File Prior to Visit  Medication Sig Dispense Refill   aspirin 81 MG EC tablet Take 81 mg by mouth daily.     atorvastatin (LIPITOR) 40 MG  tablet TAKE 1 TABLET BY MOUTH  DAILY 90 tablet 3   carvedilol (COREG) 12.5 MG tablet TAKE 1 TABLET BY MOUTH  TWICE DAILY WITH MEALS 180 tablet 3   Cholecalciferol (VITAMIN D3) 1000 UNITS CAPS Take 1 capsule (1,000 Units total) by mouth daily. 90 capsule 3   doxazosin (CARDURA) 2 MG tablet Take 1 tablet (2 mg total) by mouth daily. 90 tablet 3   finasteride (PROSCAR) 5 MG tablet Take 5 mg by mouth daily.     Fluticasone-Umeclidin-Vilant (TRELEGY ELLIPTA) 200-62.5-25 MCG/ACT AEPB Inhale 1 puff into the lungs daily. 1 each 0   furosemide (LASIX) 20 MG tablet Take 20 mg by mouth daily.     glimepiride (AMARYL) 2 MG tablet TAKE 2 TABLETS BY MOUTH  DAILY BEFORE BREAKFAST 180 tablet 3   glucose blood (ONE TOUCH ULTRA TEST) test strip Use as instructed E11.9 200 each 12    losartan (COZAAR) 100 MG tablet TAKE 1 TABLET BY MOUTH  DAILY 90 tablet 3   Multiple Vitamins-Minerals (MULTIVITAMIN PO) Take 1 tablet by mouth daily.     pantoprazole (PROTONIX) 40 MG tablet TAKE 1 TABLET BY MOUTH  DAILY 90 tablet 3   PAXIL 10 MG tablet Take 10 mg by mouth daily.     pioglitazone (ACTOS) 15 MG tablet Take 1 tablet (15 mg total) by mouth daily. 90 tablet 3   tamsulosin (FLOMAX) 0.4 MG CAPS capsule TAKE 1 CAPSULE BY MOUTH  DAILY 90 capsule 3   fluticasone furoate-vilanterol (BREO ELLIPTA) 200-25 MCG/INH AEPB Inhale 1 puff into the lungs daily. (Patient not taking: Reported on 01/14/2021)     No current facility-administered medications on file prior to visit.        ROS:  All others reviewed and negative.  Objective        PE:  BP 138/72 (BP Location: Left Arm, Patient Position: Sitting, Cuff Size: Large)   Pulse (!) 56   Temp 97.8 F (36.6 C) (Oral)   Ht _0  (1.88 m)   Wt 189 lb 9.6 oz (86 kg)   SpO2 96%   BMI 24.34 kg/m                 Constitutional: Pt appears in NAD               HENT: Head: NCAT.                Right Ear: External ear normal.                 Left Ear: External ear normal.                Eyes: . Pupils are equal, round, and reactive to light. Conjunctivae and EOM are normal               Nose: without d/c or deformity               Neck: Neck supple. Gross normal ROM               Cardiovascular: Normal rate and regular rhythm.                 Pulmonary/Chest: Effort normal and breath sounds without rales or wheezing.                Abd:  Soft, NT, ND, + BS, no organomegaly               Neurological: Pt is alert.  At baseline orientation, motor grossly intact               Skin: Skin is warm. No rashes, no other new lesions, LE edema - chronic rle trace to 1+               Psychiatric: Pt behavior is normal without agitation   Micro: none  Cardiac tracings I have personally interpreted today:  none  Pertinent Radiological findings  (summarize): none   Lab Results  Component Value Date   WBC 3.0 (L) 10/23/2020   HGB 12.9 (L) 10/23/2020   HCT 39.9 10/23/2020   PLT 150 10/23/2020   GLUCOSE 128 (H) 01/14/2021   CHOL 132 01/14/2021   TRIG 65.0 01/14/2021   HDL 56.50 01/14/2021   LDLCALC 62 01/14/2021   ALT 18 01/14/2021   AST 24 01/14/2021   NA 141 01/14/2021   K 4.0 01/14/2021   CL 104 01/14/2021   CREATININE 1.77 (H) 01/14/2021   BUN 35 (H) 01/14/2021   CO2 28 01/14/2021   TSH 4.48 04/06/2019   PSA 4.61 (H) 04/09/2017   INR 1.12 06/19/2015   HGBA1C 7.5 (H) 01/14/2021   MICROALBUR 0.8 04/06/2019   Assessment/Plan:  Nicholas Patton is a 86 y.o. Black or African American [2] male with  has a past medical history of Anemia, Arthritis, Cataract, Colon polyps, DIABETES MELLITUS, TYPE II, Diverticulosis, GERD (gastroesophageal reflux disease), History of colon polyps, HYPERLIPIDEMIA, HYPERTENSION, Ileus following gastrointestinal surgery ([place]) (07/22/2012), Joint pain, Peripheral neuropathy, Peripheral neuropathy, PERIPHERAL NEUROPATHY, FEET (10/06/2007), Renal disorder, VITAMIN B12 DEFICIENCY (09/06/2008), and VITAMIN D DEFICIENCY (01/16/2010).  Diabetes Lab Results  Component Value Date   HGBA1C 7.5 (H) 01/14/2021   Uncontrolled but ok for age, pt to continue current medical treatment amaryl, actos   B12 deficiency Lab Results  Component Value Date   PCHEKBTC48 185 01/14/2021   Stable, cont oral replacement - b12 1000 mcg qd   Vitamin D deficiency Last vitamin D Lab Results  Component Value Date   VD25OH 41.40 01/14/2021   Stable, cont oral replacement   Acute cough Exam benign, etiology unclear, for cxr  CKD (chronic kidney disease) stage 3, GFR 30-59 ml/min Lab Results  Component Value Date   CREATININE 1.77 (H) 01/14/2021   Stable overall, cont to avoid nephrotoxins   Pain and swelling of lower leg Chronic stable no change, for RLE compression stocking during day  Followup: Return in  about 6 months (around 07/14/2021).  Cathlean Cower, MD 01/19/2021 7:42 PM [facility]

## 2021-01-15 ENCOUNTER — Encounter: Payer: Self-pay | Admitting: Internal Medicine

## 2021-01-18 ENCOUNTER — Other Ambulatory Visit: Payer: Self-pay

## 2021-01-18 ENCOUNTER — Ambulatory Visit
Admission: RE | Admit: 2021-01-18 | Discharge: 2021-01-18 | Disposition: A | Discharge status: Home / Self Care | Payer: Medicare Other | Source: Ambulatory Visit | Attending: Sports Medicine | Admitting: Sports Medicine

## 2021-01-18 DIAGNOSIS — S46012A Strain of muscle(s) and tendon(s) of the rotator cuff of left shoulder, initial encounter: Secondary | ICD-10-CM | POA: Diagnosis not present

## 2021-01-18 DIAGNOSIS — M25512 Pain in left shoulder: Secondary | ICD-10-CM

## 2021-01-19 ENCOUNTER — Encounter: Payer: Self-pay | Admitting: Internal Medicine

## 2021-01-19 DIAGNOSIS — R051 Acute cough: Secondary | ICD-10-CM | POA: Insufficient documentation

## 2021-01-19 NOTE — Assessment & Plan Note (Signed)
Last vitamin D Lab Results  Component Value Date   VD25OH 41.40 01/14/2021   Stable, cont oral replacement

## 2021-01-19 NOTE — Assessment & Plan Note (Signed)
Chronic stable no change, for RLE compression stocking during day

## 2021-01-19 NOTE — Assessment & Plan Note (Signed)
Exam benign, etiology unclear, for cxr

## 2021-01-19 NOTE — Assessment & Plan Note (Signed)
Lab Results  Component Value Date   VITAMINB12 374 01/14/2021   Stable, cont oral replacement - b12 1000 mcg qd  

## 2021-01-19 NOTE — Assessment & Plan Note (Signed)
Lab Results  Component Value Date   CREATININE 1.77 (H) 01/14/2021   Stable overall, cont to avoid nephrotoxins

## 2021-01-19 NOTE — Assessment & Plan Note (Signed)
Lab Results  Component Value Date   HGBA1C 7.5 (H) 01/14/2021   Uncontrolled but ok for age, pt to continue current medical treatment amaryl, actos

## 2021-01-22 DIAGNOSIS — S46012A Strain of muscle(s) and tendon(s) of the rotator cuff of left shoulder, initial encounter: Secondary | ICD-10-CM | POA: Diagnosis not present

## 2021-02-03 ENCOUNTER — Encounter: Payer: Self-pay | Admitting: Pulmonary Disease

## 2021-02-10 ENCOUNTER — Other Ambulatory Visit: Payer: Self-pay | Admitting: Pulmonary Disease

## 2021-02-19 ENCOUNTER — Other Ambulatory Visit: Payer: Self-pay

## 2021-02-19 ENCOUNTER — Ambulatory Visit (INDEPENDENT_AMBULATORY_CARE_PROVIDER_SITE_OTHER): Payer: Medicare Other | Admitting: Podiatry

## 2021-02-19 ENCOUNTER — Encounter: Payer: Self-pay | Admitting: Podiatry

## 2021-02-19 DIAGNOSIS — M79674 Pain in right toe(s): Secondary | ICD-10-CM | POA: Diagnosis not present

## 2021-02-19 DIAGNOSIS — E1142 Type 2 diabetes mellitus with diabetic polyneuropathy: Secondary | ICD-10-CM | POA: Diagnosis not present

## 2021-02-19 DIAGNOSIS — B351 Tinea unguium: Secondary | ICD-10-CM

## 2021-02-19 DIAGNOSIS — D631 Anemia in chronic kidney disease: Secondary | ICD-10-CM | POA: Diagnosis not present

## 2021-02-19 DIAGNOSIS — N2581 Secondary hyperparathyroidism of renal origin: Secondary | ICD-10-CM | POA: Diagnosis not present

## 2021-02-19 DIAGNOSIS — L84 Corns and callosities: Secondary | ICD-10-CM

## 2021-02-19 DIAGNOSIS — N189 Chronic kidney disease, unspecified: Secondary | ICD-10-CM | POA: Diagnosis not present

## 2021-02-19 DIAGNOSIS — M79675 Pain in left toe(s): Secondary | ICD-10-CM

## 2021-02-19 DIAGNOSIS — I129 Hypertensive chronic kidney disease with stage 1 through stage 4 chronic kidney disease, or unspecified chronic kidney disease: Secondary | ICD-10-CM | POA: Diagnosis not present

## 2021-02-19 DIAGNOSIS — N1832 Chronic kidney disease, stage 3b: Secondary | ICD-10-CM | POA: Diagnosis not present

## 2021-02-19 DIAGNOSIS — R600 Localized edema: Secondary | ICD-10-CM | POA: Diagnosis not present

## 2021-02-23 NOTE — Progress Notes (Signed)
Subjective: Nicholas Patton is a 86 y.o. male patient seen today for follow up of  painful thick toenails that are difficult to trim. Pain interferes with ambulation. Aggravating factors include wearing enclosed shoe gear. Pain is relieved with periodic professional debridement.  New problems reported today: None.  Patient states their blood glucose was 130 mg/dl today.   PCP is [name], MD. Last visit was: 01/14/2021.  Allergies  Allergen Reactions   Amlodipine Swelling   Cymbalta [Duloxetine Hcl] Other (See Comments)   Oxycodone Other (See Comments)    Objective: Physical Exam  General: Patient is a pleasant 86 y.o. African American male WD, WN in NAD. AAO x 3.   Neurovascular Examination: Capillary refill time to digits immediate b/l. Palpable DP pulse(s) b/l LE. Palpable PT pulse(s) b/l LE. Pedal hair absent. Nonpitting edema noted right lower extremity.  Pt has subjective symptoms of neuropathy. Protective sensation intact 5/5 intact bilaterally with 10g monofilament b/l. Vibratory sensation intact b/l.  Dermatological:  Pedal skin is warm and supple b/l LE. No open wounds b/l LE. No interdigital macerations noted b/l LE. Toenails 1-5 b/l elongated, discolored, dystrophic, thickened, crumbly with subungual debris and tenderness to dorsal palpation. Incurvated nailplate medial border left 2nd toe and lateral border left hallux  Nail border hypertrophy minimal. There is tenderness to palpation. Sign(s) of infection: no clinical signs of infection noted on examination today.. Hyperkeratotic lesion(s) bilateral great toes and submet head 1 left foot.  No erythema, no edema, no drainage, no fluctuance.  Musculoskeletal:  Muscle strength 5/5 to all lower extremity muscle groups bilaterally. No pain, crepitus or joint limitation noted with ROM bilateral LE. HAV with bunion deformity noted b/l LE. Hammertoe deformity noted 2-5 b/l.  Assessment: 1. Pain due to onychomycosis of  toenails of both feet   2. Corns and callosities   3. Diabetic peripheral neuropathy associated with type 2 diabetes mellitus ([place])     Plan: Patient was evaluated and treated and all questions answered. Consent given for treatment as described below: -Continue foot and shoe inspections daily. Monitor blood glucose per PCP/Endocrinologist's recommendations. -Mycotic toenails 1-5 bilaterally were debrided in length and girth with sterile nail nippers and dremel without incident. -Offending nail border debrided and curretaged L hallux and L 2nd toe utilizing sterile nail nipper and currette. Border cleansed with alcohol and triple antibiotic applied. No further treatment required by patient/caregiver. -Callus(es) bilateral great toes and submet head 1 left foot pared utilizing sterile scalpel blade without complication or incident. Total number debrided =3. -Patient/POA to call should there be question/concern in the interim.  Return in about 3 months (around 05/20/2021).  Marzetta Board, DPM

## 2021-03-17 ENCOUNTER — Other Ambulatory Visit: Payer: Self-pay

## 2021-03-17 ENCOUNTER — Encounter: Payer: Self-pay | Admitting: Pulmonary Disease

## 2021-03-17 ENCOUNTER — Ambulatory Visit (INDEPENDENT_AMBULATORY_CARE_PROVIDER_SITE_OTHER): Payer: Medicare Other | Admitting: Pulmonary Disease

## 2021-03-17 VITALS — BP 122/64 | HR 68 | Temp 97.9°F | Ht 74.0 in | Wt 184.4 lb

## 2021-03-17 DIAGNOSIS — R0600 Dyspnea, unspecified: Secondary | ICD-10-CM | POA: Diagnosis not present

## 2021-03-17 DIAGNOSIS — J4520 Mild intermittent asthma, uncomplicated: Secondary | ICD-10-CM | POA: Diagnosis not present

## 2021-03-17 NOTE — Progress Notes (Signed)
Patient ID: Nicholas Patton, male    DOB: 05/17/1934, 87 y.o.   MRN: [id number]  Chief Complaint  Patient presents with   Follow-up    Follow up for dyspnea. Pt states that his dyspnea is doing pretty good. Pt is on Breo and he states its working well for him     Referring provider: [name], MD  HPI:   Nicholas Patton is a 87 y.o. man with reported "COPD" with spirometry without fixed obstruction but with significant bronchodilator response whom we are seeing in follow up for asthma, DOE.    Returns for routine follow-up.  Overall, breathing stable.  Rare use of albuterol rescue inhaler.  Has resumed Breo with improvement in overall symptoms.  Occasional dry cough.  Not particular bothersome.  Discussed with him and daughter rationale for trying Trelegy earlier given worsening symptoms.  Unfortunately very expensive.  Discussed we could explore options to help help with expense in the future if symptoms worsen but given no real improvement on triple inhaled therapy compared to ICS/LABA, agreed to continue ICS/LABA therapy versus Breo.  Pleased with slight improvement in symptoms over the last few weeks.  PFTs reviewed.  HPI at initial visit: Has had some mild dyspnea exertion going on some time now.  At least months to years.  He has been told he has COPD in the past but has never had spirometry.  Shortness of breath worse walking longer distances or on inclines.  Rest resolve symptoms.  No real shortness of breath at rest.  He has hayfever, seasonal allergies regularly.  Will use medicines to treat this when bad.  No recurrent pneumonias in the past no real bad bouts of bronchitis.  No asthma or exertional limitation as a child.  No syncope or presyncope.  Reportedly has occasionally received prednisone for some breathing difficulties in the past.  Is been at least 2 years or more since last use.  Spirometry recently demonstrates ratio at 68 to 69% but given his age this is over 90%  predicted and within normal limits.  Significant bronchodilator response in both FEV1 and FVC.  CT scan 05/2019 personally reviewed and interpreted as diffuse significant upper lobe predominant emphysema without other significant abnormality.  PMH: Diabetes, hypertension Surgical history: Cataracts, neck surgery/fusion next knee replacement, cholecystectomy Family history: No history of lung cancer Social history: Former smoker, quit late 85s, lives in [place] / Pulmonary Flowsheets:   ACT:  No flowsheet data found.  MMRC: mMRC Dyspnea Scale mMRC Score  11/18/2020 3  05/15/2020 0    Epworth:  No flowsheet data found.  Tests:   FENO:  No results found for: NITRICOXIDE  PFT: PFT Results Latest Ref Rng & Units 10/04/2019  FVC-Pre L 3.21  FVC-Predicted Pre % 78  FVC-Post L 3.67  FVC-Predicted Post % 90  Pre FEV1/FVC % % 68  Post FEV1/FCV % % 69  FEV1-Pre L 2.18  FEV1-Predicted Pre % 73  FEV1-Post L 2.55  DLCO uncorrected ml/min/mmHg 12.86  DLCO UNC% % 49  DLCO corrected ml/min/mmHg 12.94  DLCO COR %Predicted % 49  DLVA Predicted % 50  TLC L 7.34  TLC % Predicted % 95  RV % Predicted % 120  Personally reviewed and interpreted as normal spirometry with significant bronchodilator response.  TLC within normal limits, RV gas trapping.  DLCO severely reduced.  WALK:  No flowsheet data found.  Imaging: No results found.  Lab Results:  CBC  Component Value Date/Time   WBC 3.0 (L) 10/23/2020 1014   WBC 4.5 09/08/2020 2138   RBC 4.29 10/23/2020 1014   HGB 12.9 (L) 10/23/2020 1014   HCT 39.9 10/23/2020 1014   PLT 150 10/23/2020 1014   MCV 93.0 10/23/2020 1014   MCH 30.1 10/23/2020 1014   MCHC 32.3 10/23/2020 1014   RDW 14.1 10/23/2020 1014   LYMPHSABS 0.8 10/23/2020 1014   MONOABS 0.3 10/23/2020 1014   EOSABS 0.1 10/23/2020 1014   BASOSABS 0.0 10/23/2020 1014    BMET    Component Value Date/Time   NA 141 01/14/2021 1050   K 4.0 01/14/2021  1050   CL 104 01/14/2021 1050   CO2 28 01/14/2021 1050   GLUCOSE 128 (H) 01/14/2021 1050   BUN 35 (H) 01/14/2021 1050   CREATININE 1.77 (H) 01/14/2021 1050   CREATININE 2.06 (H) 10/23/2020 1014   CREATININE 1.68 (H) 10/04/2019 1115   CALCIUM 10.1 01/14/2021 1050   GFRNONAA 31 (L) 10/23/2020 1014   GFRNONAA 37 (L) 10/04/2019 1115   GFRAA 41 (L) 10/25/2019 1052   GFRAA 43 (L) 10/04/2019 1115    BNP    Component Value Date/Time   BNP 48.5 05/18/2019 1918    ProBNP No results found for: PROBNP  Specialty Problems       Pulmonary Problems   Allergic rhinitis    Qualifier: Diagnosis of  By: [name] MD, Hunt Oris       Acute upper respiratory infection   Dyspnea   Left-sided nosebleed   COPD (chronic obstructive pulmonary disease) (HCC)   Acute cough    Allergies  Allergen Reactions   Amlodipine Swelling   Cymbalta [Duloxetine Hcl] Other (See Comments)   Oxycodone Other (See Comments)    Immunization History  Administered Date(s) Administered   Fluad Quad(high Dose 65+) 11/09/2018, 11/30/2019, 12/14/2020   Influenza Whole 11/21/2015   Influenza, High Dose Seasonal PF 01/24/2013, 12/01/2016, 11/22/2017   Influenza,inj,Quad PF,6+ Mos 12/08/2013   Influenza-Unspecified 02/08/2015   PFIZER(Purple Top)SARS-COV-2 Vaccination 04/03/2019, 04/10/2019, 12/23/2019, 10/10/2020   Pneumococcal Conjugate-13 03/22/2013   Pneumococcal Polysaccharide-23 01/10/2016   Td 10/05/2014    Past Medical History:  Diagnosis Date   Anemia    Arthritis    Cataract    REMOVED BILATERAL   Colon polyps    DIABETES MELLITUS, TYPE II    takes Metformin and Glimepiride daily   Diverticulosis    GERD (gastroesophageal reflux disease)    takes Protonix daily   History of colon polyps    HYPERLIPIDEMIA    takes Atorvastatin daily   HYPERTENSION    takes Amlodipine and Hyzaar daily   Ileus following gastrointestinal surgery ([place]) 07/22/2012   Joint pain    Peripheral neuropathy     Peripheral neuropathy    PERIPHERAL NEUROPATHY, FEET 10/06/2007   Renal disorder    VITAMIN B12 DEFICIENCY 09/06/2008   VITAMIN D DEFICIENCY 01/16/2010   takes Vitamin D daily    Tobacco History: Social History   Tobacco Use  Smoking Status Former   Packs/day: 0.00   Types: Cigarettes  Smokeless Tobacco Never  Tobacco Comments   quit smoking around 1990   Counseling given: Not Answered Tobacco comments: quit smoking around [place] to not smoke  Outpatient Encounter Medications as of 03/17/2021  Medication Sig   aspirin 81 MG EC tablet Take 81 mg by mouth daily.   atorvastatin (LIPITOR) 40 MG tablet TAKE 1 TABLET BY MOUTH  DAILY   BREO  ELLIPTA 200-25 MCG/ACT AEPB USE 1 INHALATION BY MOUTH  DAILY   carvedilol (COREG) 12.5 MG tablet TAKE 1 TABLET BY MOUTH  TWICE DAILY WITH MEALS   Cholecalciferol (VITAMIN D3) 1000 UNITS CAPS Take 1 capsule (1,000 Units total) by mouth daily.   doxazosin (CARDURA) 2 MG tablet Take 1 tablet (2 mg total) by mouth daily.   finasteride (PROSCAR) 5 MG tablet Take 5 mg by mouth daily.   furosemide (LASIX) 20 MG tablet Take 20 mg by mouth daily.   glimepiride (AMARYL) 2 MG tablet TAKE 2 TABLETS BY MOUTH  DAILY BEFORE BREAKFAST   glucose blood (ONE TOUCH ULTRA TEST) test strip Use as instructed E11.9   losartan (COZAAR) 100 MG tablet TAKE 1 TABLET BY MOUTH  DAILY   Multiple Vitamins-Minerals (MULTIVITAMIN PO) Take 1 tablet by mouth daily.   pantoprazole (PROTONIX) 40 MG tablet TAKE 1 TABLET BY MOUTH  DAILY   PAXIL 10 MG tablet Take 10 mg by mouth daily.   pioglitazone (ACTOS) 15 MG tablet Take 1 tablet (15 mg total) by mouth daily.   tamsulosin (FLOMAX) 0.4 MG CAPS capsule TAKE 1 CAPSULE BY MOUTH  DAILY   [DISCONTINUED] Fluticasone-Umeclidin-Vilant (TRELEGY ELLIPTA) 200-62.5-25 MCG/ACT AEPB Inhale 1 puff into the lungs daily.   No facility-administered encounter medications on file as of 03/17/2021.     Review of Systems  Review of Systems   N/a Physical Exam  BP 122/64 (BP Location: Left Arm, Patient Position: Sitting, Cuff Size: Normal)    Pulse 68    Temp 97.9 F (36.6 C) (Oral)    Ht _0  (1.88 m)    Wt 184 lb 6.4 oz (83.6 kg)    SpO2 98%    BMI 23.68 kg/m   Wt Readings from Last 5 Encounters:  03/17/21 184 lb 6.4 oz (83.6 kg)  01/14/21 189 lb 9.6 oz (86 kg)  12/26/20 189 lb (85.7 kg)  11/18/20 186 lb 12.8 oz (84.7 kg)  11/18/20 186 lb (84.4 kg)    BMI Readings from Last 5 Encounters:  03/17/21 23.68 kg/m  01/14/21 24.34 kg/m  12/26/20 24.27 kg/m  11/18/20 23.98 kg/m  11/18/20 23.88 kg/m     Physical Exam General: Well-appearing, in no acute distress Eyes: EOMI, no icterus Neck: Supple, no JVP appreciated sitting upright Respiratory: Clear to auscultation bilaterally, normal work of breathing on room air Extremities: Warm, no edema MSK: No synovitis, no joint effusions Neuro: Normal gait, no weakness Psych: Normal mood, full affect   Assessment & Plan:   Nicholas Patton is a 87 y.o. man with reported COPD with recent spirometry without fixed obstruction but with significant bronchodilator response whom we are seeing for follow up for evaluation of DOE.  DOE: PFTs are suggestive of asthma and this is likely a driver of symptoms.  Some of deconditioning given he admits to being less active.  Improved with ICS/LABA therapy but still present. Additionally, DLCO is severely reduced which is related to emphysema seen on CT but also concern for contribution of pulmonary HTN given elevated PA pressures seen on TTE in 2016. Repeat TTE 10/2019 with mildly elevated PASP, RV enlargement but normal RV function and size.  Also developing element of deconditioning given his lack of activity in the preceding months.  Asked him to gradually increase his activity.  Escalated to Trelegy fall 2022 without improvement.  Subsequent improvement after resuming Memory Dance Winter 2022.  It is possible that symptoms are related to  worsening pulmonary hypertension.  Unfortunately,  given his significant emphysematous changes there is no real good options for treatment of this.  Asthma: Had improved with ICS/LABA.  Encouraged increased exercise.  Breo seen and no longer as effective follow-up 2022.  Trial Trelegy without significant improvement.  Back on Breo with adequate control of symptoms.   Return in about 6 months (around 09/14/2021).   Nicholas Clam, MD 03/17/2021

## 2021-03-17 NOTE — Patient Instructions (Addendum)
Nice to see you again  No changes to medications - continue Memory Dance  Let me know if you need any refills  Return to clinic in 6 months or sooner as needed

## 2021-03-24 ENCOUNTER — Other Ambulatory Visit: Payer: Self-pay | Admitting: Internal Medicine

## 2021-03-24 NOTE — Telephone Encounter (Signed)
Please refill as per office routine med refill policy (all routine meds to be refilled for 3 mo or monthly (per pt preference) up to one year from last visit, then month to month grace period for 3 mo, then further med refills will have to be denied) ? ?

## 2021-04-21 ENCOUNTER — Encounter: Payer: Self-pay | Admitting: Internal Medicine

## 2021-04-21 ENCOUNTER — Other Ambulatory Visit: Payer: Self-pay

## 2021-04-21 ENCOUNTER — Ambulatory Visit (INDEPENDENT_AMBULATORY_CARE_PROVIDER_SITE_OTHER): Payer: Medicare Other | Admitting: Internal Medicine

## 2021-04-21 VITALS — BP 118/70 | HR 65 | Temp 97.5°F | Ht 74.0 in | Wt 186.2 lb

## 2021-04-21 DIAGNOSIS — E1140 Type 2 diabetes mellitus with diabetic neuropathy, unspecified: Secondary | ICD-10-CM | POA: Diagnosis not present

## 2021-04-21 DIAGNOSIS — N1832 Chronic kidney disease, stage 3b: Secondary | ICD-10-CM

## 2021-04-21 DIAGNOSIS — M25572 Pain in left ankle and joints of left foot: Secondary | ICD-10-CM | POA: Diagnosis not present

## 2021-04-21 DIAGNOSIS — M25571 Pain in right ankle and joints of right foot: Secondary | ICD-10-CM | POA: Diagnosis not present

## 2021-04-21 DIAGNOSIS — E559 Vitamin D deficiency, unspecified: Secondary | ICD-10-CM

## 2021-04-21 LAB — CBC WITH DIFFERENTIAL/PLATELET
Basophils Absolute: 0 10*3/uL (ref 0.0–0.1)
Basophils Relative: 0.8 % (ref 0.0–3.0)
Eosinophils Absolute: 0.1 10*3/uL (ref 0.0–0.7)
Eosinophils Relative: 3.6 % (ref 0.0–5.0)
HCT: 42.4 % (ref 39.0–52.0)
Hemoglobin: 13.6 g/dL (ref 13.0–17.0)
Lymphocytes Relative: 28.2 % (ref 12.0–46.0)
Lymphs Abs: 1.2 10*3/uL (ref 0.7–4.0)
MCHC: 32.1 g/dL (ref 30.0–36.0)
MCV: 91.3 fl (ref 78.0–100.0)
Monocytes Absolute: 0.5 10*3/uL (ref 0.1–1.0)
Monocytes Relative: 12.6 % — ABNORMAL HIGH (ref 3.0–12.0)
Neutro Abs: 2.2 10*3/uL (ref 1.4–7.7)
Neutrophils Relative %: 54.8 % (ref 43.0–77.0)
Platelets: 152 10*3/uL (ref 150.0–400.0)
RBC: 4.64 Mil/uL (ref 4.22–5.81)
RDW: 15.2 % (ref 11.5–15.5)
WBC: 4.1 10*3/uL (ref 4.0–10.5)

## 2021-04-21 LAB — VITAMIN D 25 HYDROXY (VIT D DEFICIENCY, FRACTURES): VITD: 40.46 ng/mL (ref 30.00–100.00)

## 2021-04-21 LAB — TSH: TSH: 6.4 u[IU]/mL — ABNORMAL HIGH (ref 0.35–5.50)

## 2021-04-21 NOTE — Patient Instructions (Addendum)
Please remember to see your eye doctor every year  Please continue all other medications as before, and refills have been done if requested.  Please have the pharmacy call with any other refills you may need.  Please continue your efforts at being more active, low cholesterol diet, and weight control.  Please keep your appointments with your specialists as you may have planned  You will be contacted regarding the referral for: Sports Medicine on the first floor for the tendonitis to the ankles  Please go to the LAB at the blood drawing area for the tests to be done  You will be contacted by phone if any changes need to be made immediately.  Otherwise, you will receive a letter about your results with an explanation, but please check with MyChart first.  Please remember to sign up for MyChart if you have not done so, as this will be important to you in the future with finding out test results, communicating by private email, and scheduling acute appointments online when needed.  Please make an Appointment to return in 6 months, or sooner if needed

## 2021-04-21 NOTE — Assessment & Plan Note (Signed)
Lab Results  Component Value Date   CREATININE 1.77 (H) 01/14/2021   Stable overall, cont to avoid nephrotoxins  

## 2021-04-21 NOTE — Assessment & Plan Note (Signed)
Lab Results  Component Value Date   HGBA1C 7.5 (H) 01/14/2021   Stable, pt to continue current medical treatment glimeparide, actos

## 2021-04-21 NOTE — Assessment & Plan Note (Signed)
C/w likely bilateral tarsal tunnell syndrome - for sport med referral,  to f/u any worsening symptoms or concerns

## 2021-04-21 NOTE — Assessment & Plan Note (Signed)
Last vitamin D Lab Results  Component Value Date   VD25OH 41.40 01/14/2021   Stable, cont oral replacement

## 2021-04-21 NOTE — Progress Notes (Signed)
Patient ID: Nicholas Patton, male   DOB: 12-30-1934, 87 y.o.   MRN: [id number]        Chief Complaint: follow up HTN, MD, bilateral ankle pain       HPI:  Nicholas Patton is a 87 y.o. male here with c/o bilateral ankle pain at the tarsal tunnel areas, mild to mod, intermittent, sharp, worse to walk, better to sit, ongoing for over 1 mo. Pt denies chest pain, increased sob or doe, wheezing, orthopnea, PND, increased LE swelling, palpitations, dizziness or syncope.   Pt denies polydipsia, polyuria, or new focal neuro s/s.   Pt denies fever, wt loss, night sweats, loss of appetite, or other constitutional symptoms         Wt Readings from Last 3 Encounters:  04/21/21 186 lb 3.2 oz (84.5 kg)  03/17/21 184 lb 6.4 oz (83.6 kg)  01/14/21 189 lb 9.6 oz (86 kg)   BP Readings from Last 3 Encounters:  04/21/21 118/70  03/17/21 122/64  01/14/21 138/72         Past Medical History:  Diagnosis Date   Anemia    Arthritis    Cataract    REMOVED BILATERAL   Colon polyps    DIABETES MELLITUS, TYPE II    takes Metformin and Glimepiride daily   Diverticulosis    GERD (gastroesophageal reflux disease)    takes Protonix daily   History of colon polyps    HYPERLIPIDEMIA    takes Atorvastatin daily   HYPERTENSION    takes Amlodipine and Hyzaar daily   Ileus following gastrointestinal surgery ([place]) 07/22/2012   Joint pain    Peripheral neuropathy    Peripheral neuropathy    PERIPHERAL NEUROPATHY, FEET 10/06/2007   Renal disorder    VITAMIN B12 DEFICIENCY 09/06/2008   VITAMIN D DEFICIENCY 01/16/2010   takes Vitamin D daily   Past Surgical History:  Procedure Laterality Date   CATARACT EXTRACTION     both eyes   CHOLECYSTECTOMY N/A 07/18/2012   Procedure: LAPAROSCOPIC CHOLECYSTECTOMY WITH INTRAOPERATIVE CHOLANGIOGRAM;  Surgeon: Zenovia Jarred, MD;  Location: [facility];  Service: General;  Laterality: N/A;   COLONOSCOPY     POLYPECTOMY     POSTERIOR CERVICAL FUSION/FORAMINOTOMY N/A 06/27/2015    Procedure: Posterior Cervical Fusion with lateral mass fixation Cervical four to cervical seven;  Surgeon: Eustace Moore, MD;  Location: [facility];  Service: Neurosurgery;  Laterality: N/A;   TOTAL KNEE ARTHROPLASTY  2001   Left   TURP VAPORIZATION      reports that he has quit smoking. His smoking use included cigarettes. He has never used smokeless tobacco. He reports that he does not drink alcohol and does not use drugs. family history includes Diabetes Mellitus II in his mother; Lung cancer in his brother. Allergies  Allergen Reactions   Amlodipine Swelling   Cymbalta [Duloxetine Hcl] Other (See Comments)   Oxycodone Other (See Comments)   Current Outpatient Medications on File Prior to Visit  Medication Sig Dispense Refill   aspirin 81 MG EC tablet Take 81 mg by mouth daily.     atorvastatin (LIPITOR) 40 MG tablet TAKE 1 TABLET BY MOUTH  DAILY 90 tablet 3   BREO ELLIPTA 200-25 MCG/ACT AEPB USE 1 INHALATION BY MOUTH  DAILY 180 each 3   carvedilol (COREG) 12.5 MG tablet TAKE 1 TABLET BY MOUTH  TWICE DAILY WITH MEALS 180 tablet 3   Cholecalciferol (VITAMIN D3) 1000 UNITS CAPS Take 1 capsule (1,000 Units total)  by mouth daily. 90 capsule 3   doxazosin (CARDURA) 2 MG tablet Take 1 tablet (2 mg total) by mouth daily. 90 tablet 3   finasteride (PROSCAR) 5 MG tablet Take 5 mg by mouth daily.     furosemide (LASIX) 20 MG tablet Take 20 mg by mouth daily.     glimepiride (AMARYL) 2 MG tablet TAKE 2 TABLETS BY MOUTH  DAILY BEFORE BREAKFAST 180 tablet 3   glucose blood (ONE TOUCH ULTRA TEST) test strip Use as instructed E11.9 200 each 12   losartan (COZAAR) 100 MG tablet TAKE 1 TABLET BY MOUTH  DAILY 90 tablet 3   Multiple Vitamins-Minerals (MULTIVITAMIN PO) Take 1 tablet by mouth daily.     pantoprazole (PROTONIX) 40 MG tablet TAKE 1 TABLET BY MOUTH  DAILY 90 tablet 3   PAXIL 10 MG tablet Take 10 mg by mouth daily.     pioglitazone (ACTOS) 15 MG tablet Take 1 tablet (15 mg total) by mouth  daily. 90 tablet 3   tamsulosin (FLOMAX) 0.4 MG CAPS capsule TAKE 1 CAPSULE BY MOUTH  DAILY 90 capsule 3   No current facility-administered medications on file prior to visit.        ROS:  All others reviewed and negative.  Objective        PE:  BP 118/70 (BP Location: Left Arm, Patient Position: Sitting, Cuff Size: Large)    Pulse 65    Temp (!) 97.5 F (36.4 C) (Oral)    Ht 6' 2" (1.88 m)    Wt 186 lb 3.2 oz (84.5 kg)    SpO2 95%    BMI 23.91 kg/m                 Constitutional: Pt appears in NAD               HENT: Head: NCAT.                Right Ear: External ear normal.                 Left Ear: External ear normal.                Eyes: . Pupils are equal, round, and reactive to light. Conjunctivae and EOM are normal               Nose: without d/c or deformity               Neck: Neck supple. Gross normal ROM               Cardiovascular: Normal rate and regular rhythm.                 Pulmonary/Chest: Effort normal and breath sounds without rales or wheezing.                               Neurological: Pt is alert. At baseline orientation, motor grossly intact               Skin: LE edema - none, but has bilateral medial tarsal tunnel tender with mild swelling nondiscrete               Psychiatric: Pt behavior is normal without agitation   Micro: none  Cardiac tracings I have personally interpreted today:  none  Pertinent Radiological findings (summarize): none   Lab Results  Component Value Date   WBC 3.0 (  L) 10/23/2020   HGB 12.9 (L) 10/23/2020   HCT 39.9 10/23/2020   PLT 150 10/23/2020   GLUCOSE 128 (H) 01/14/2021   CHOL 132 01/14/2021   TRIG 65.0 01/14/2021   HDL 56.50 01/14/2021   LDLCALC 62 01/14/2021   ALT 18 01/14/2021   AST 24 01/14/2021   NA 141 01/14/2021   K 4.0 01/14/2021   CL 104 01/14/2021   CREATININE 1.77 (H) 01/14/2021   BUN 35 (H) 01/14/2021   CO2 28 01/14/2021   TSH 4.48 04/06/2019   PSA 4.61 (H) 04/09/2017   INR 1.12 06/19/2015    HGBA1C 7.5 (H) 01/14/2021   MICROALBUR 0.8 04/06/2019   Assessment/Plan:  Nicholas Patton is a 87 y.o. Black or African American [2] male with  has a past medical history of Anemia, Arthritis, Cataract, Colon polyps, DIABETES MELLITUS, TYPE II, Diverticulosis, GERD (gastroesophageal reflux disease), History of colon polyps, HYPERLIPIDEMIA, HYPERTENSION, Ileus following gastrointestinal surgery ([place]) (07/22/2012), Joint pain, Peripheral neuropathy, Peripheral neuropathy, PERIPHERAL NEUROPATHY, FEET (10/06/2007), Renal disorder, VITAMIN B12 DEFICIENCY (09/06/2008), and VITAMIN D DEFICIENCY (01/16/2010).  Bilateral ankle pain C/w likely bilateral tarsal tunnell syndrome - for sport med referral,  to f/u any worsening symptoms or concerns  CKD (chronic kidney disease) stage 3, GFR 30-59 ml/min Lab Results  Component Value Date   CREATININE 1.77 (H) 01/14/2021   Stable overall, cont to avoid nephrotoxins   Diabetes Lab Results  Component Value Date   HGBA1C 7.5 (H) 01/14/2021   Stable, pt to continue current medical treatment glimeparide, actos   Vitamin D deficiency Last vitamin D Lab Results  Component Value Date   VD25OH 41.40 01/14/2021   Stable, cont oral replacement  Followup: Return in about 6 months (around 10/19/2021).  Cathlean Cower, MD 04/21/2021 8:46 PM [facility]

## 2021-04-22 LAB — URINALYSIS, ROUTINE W REFLEX MICROSCOPIC
Bilirubin Urine: NEGATIVE
Hgb urine dipstick: NEGATIVE
Ketones, ur: NEGATIVE
Leukocytes,Ua: NEGATIVE
Nitrite: NEGATIVE
RBC / HPF: NONE SEEN (ref 0–?)
Specific Gravity, Urine: 1.015 (ref 1.000–1.030)
Total Protein, Urine: NEGATIVE
Urine Glucose: NEGATIVE
Urobilinogen, UA: 0.2 (ref 0.0–1.0)
pH: 6 (ref 5.0–8.0)

## 2021-04-22 LAB — LIPID PANEL
Cholesterol: 139 mg/dL (ref 0–200)
HDL: 56.8 mg/dL (ref >39.00)
LDL Cholesterol: 66 mg/dL (ref 0–99)
NonHDL: 82.07
Total CHOL/HDL Ratio: 2
Triglycerides: 79 mg/dL (ref 0.0–149.0)
VLDL: 15.8 mg/dL (ref 0.0–40.0)

## 2021-04-22 LAB — HEMOGLOBIN A1C: Hgb A1c MFr Bld: 7.5 % — ABNORMAL HIGH (ref 4.6–6.5)

## 2021-04-22 LAB — BASIC METABOLIC PANEL
BUN: 47 mg/dL — ABNORMAL HIGH (ref 6–23)
CO2: 26 mEq/L (ref 19–32)
Calcium: 10.3 mg/dL (ref 8.4–10.5)
Chloride: 108 mEq/L (ref 96–112)
Creatinine, Ser: 2.29 mg/dL — ABNORMAL HIGH (ref 0.40–1.50)
GFR: 25.53 mL/min — ABNORMAL LOW (ref >60.00)
Glucose, Bld: 116 mg/dL — ABNORMAL HIGH (ref 70–99)
Potassium: 4.1 mEq/L (ref 3.5–5.1)
Sodium: 143 mEq/L (ref 135–145)

## 2021-04-22 LAB — HEPATIC FUNCTION PANEL
ALT: 16 U/L (ref 0–53)
AST: 20 U/L (ref 0–37)
Albumin: 4.4 g/dL (ref 3.5–5.2)
Alkaline Phosphatase: 104 U/L (ref 39–117)
Bilirubin, Direct: 0 mg/dL (ref 0.0–0.3)
Total Bilirubin: 0.5 mg/dL (ref 0.2–1.2)
Total Protein: 7.1 g/dL (ref 6.0–8.3)

## 2021-04-22 LAB — MICROALBUMIN / CREATININE URINE RATIO
Creatinine,U: 144.7 mg/dL
Microalb Creat Ratio: 0.8 mg/g (ref 0.0–30.0)
Microalb, Ur: 1.1 mg/dL (ref 0.0–1.9)

## 2021-04-23 ENCOUNTER — Other Ambulatory Visit: Payer: Self-pay | Admitting: Internal Medicine

## 2021-04-23 DIAGNOSIS — N1832 Chronic kidney disease, stage 3b: Secondary | ICD-10-CM

## 2021-04-23 NOTE — Progress Notes (Signed)
Subjective:    CC: B medial ankle pain  I, Molly Weber, LAT, ATC, am serving as scribe for Dr. Lynne Leader.  HPI: Pt is an 87 y/o male c/o B medial ankle pain, L>R, x approximately one month. Pt notes he is currently taking furosemide prescribed by his kidney doctor (Dr Carolin Sicks at [facility]). He locates his pain to the medial aspect of both ankles however his left ankle is more painful than his right.  He notes his left ankle is painful mostly but both swell.  He notes the swelling is a little obnoxious.  The swelling has improved with furosemide but only to some extent.  Ankle swelling: yes Aggravating factors: walking, transitioning to stand Treatments tried: compression stockings- too tight, lasix  Pertinent review of Systems: No fevers or chills  Relevant historical information: CKD.  Diabetes   Objective:    Vitals:   04/24/21 0848  BP: (!) 146/84  Pulse: 60  SpO2: 94%   General: Well Developed, well nourished, and in no acute distress.   MSK: Left foot and ankle: Trace pitting edema. Pronation and bunion formation present. Mildly tender palpation along the course of the posterior tibialis tendon posterior to the medial malleolus and at the tarsal tunnel. Normal foot and ankle motion. Intact strength.  Right foot and ankle: Trace pitting edema.   Mild pronation and bunion formation are present. Not particularly tender to palpation. Normal foot and ankle motion. Intact strength.  Lab and Radiology Results  Diagnostic Limited MSK Ultrasound of: Left medial ankle Posterior tibialis tendon visualized at the medial malleolus. The posterior tibialis tendon is intact however there is hypoechoic fluid tracking within the tendon sheath indicating of posterior tibialis tenosynovitis.  No definitive tendon tear is visible. Soft tissue at the medial ankle does have hypoechoic fluid tracking within it subcutaneously consistent with edema. Impression:  Posterior tibialis tendinitis and subcutaneous edema left ankle.     Results for orders placed or performed in visit on 04/21/21 (from the past 72 hour(s))  VITAMIN D 25 Hydroxy (Vit-D Deficiency, Fractures)     Status: None   Collection Time: 04/21/21  3:51 PM  Result Value Ref Range   VITD 40.46 30.00 - 100.00 ng/mL  Basic metabolic panel     Status: Abnormal   Collection Time: 04/21/21  3:51 PM  Result Value Ref Range   Sodium 143 135 - 145 mEq/L   Potassium 4.1 3.5 - 5.1 mEq/L   Chloride 108 96 - 112 mEq/L   CO2 26 19 - 32 mEq/L   Glucose, Bld 116 (H) 70 - 99 mg/dL   BUN 47 (H) 6 - 23 mg/dL   Creatinine, Ser 2.29 (H) 0.40 - 1.50 mg/dL   GFR 25.53 (L) >60.00 mL/min    Comment: Calculated using the CKD-EPI Creatinine Equation (2021)   Calcium 10.3 8.4 - 10.5 mg/dL  Urinalysis, Routine w reflex microscopic     Status: Abnormal   Collection Time: 04/21/21  3:51 PM  Result Value Ref Range   Color, Urine YELLOW Yellow;Lt. Yellow;Straw;Dark Yellow;Amber;Green;Red;Brown   APPearance Cloudy (A) Clear;Turbid;Slightly Cloudy;Cloudy   Specific Gravity, Urine 1.015 1.000 - 1.030   pH 6.0 5.0 - 8.0   Total Protein, Urine NEGATIVE Negative   Urine Glucose NEGATIVE Negative   Ketones, ur NEGATIVE Negative   Bilirubin Urine NEGATIVE Negative   Hgb urine dipstick NEGATIVE Negative   Urobilinogen, UA 0.2 0.0 - 1.0   Leukocytes,Ua NEGATIVE Negative   Nitrite NEGATIVE Negative  WBC, UA 0-2/hpf 0-2/hpf   RBC / HPF none seen 0-2/hpf   Squamous Epithelial / LPF Rare(0-4/hpf) Rare(0-4/hpf)   Amorphous Present (A) None;Present  TSH     Status: Abnormal   Collection Time: 04/21/21  3:51 PM  Result Value Ref Range   TSH 6.40 (H) 0.35 - 5.50 uIU/mL  CBC with Differential/Platelet     Status: Abnormal   Collection Time: 04/21/21  3:51 PM  Result Value Ref Range   WBC 4.1 4.0 - 10.5 K/uL   RBC 4.64 4.22 - 5.81 Mil/uL   Hemoglobin 13.6 13.0 - 17.0 g/dL   HCT 42.4 39.0 - 52.0 %   MCV 91.3  78.0 - 100.0 fl   MCHC 32.1 30.0 - 36.0 g/dL   RDW 15.2 11.5 - 15.5 %   Platelets 152.0 150.0 - 400.0 K/uL   Neutrophils Relative % 54.8 43.0 - 77.0 %   Lymphocytes Relative 28.2 12.0 - 46.0 %   Monocytes Relative 12.6 (H) 3.0 - 12.0 %   Eosinophils Relative 3.6 0.0 - 5.0 %   Basophils Relative 0.8 0.0 - 3.0 %   Neutro Abs 2.2 1.4 - 7.7 K/uL   Lymphs Abs 1.2 0.7 - 4.0 K/uL   Monocytes Absolute 0.5 0.1 - 1.0 K/uL   Eosinophils Absolute 0.1 0.0 - 0.7 K/uL   Basophils Absolute 0.0 0.0 - 0.1 K/uL  Hepatic function panel     Status: None   Collection Time: 04/21/21  3:51 PM  Result Value Ref Range   Total Bilirubin 0.5 0.2 - 1.2 mg/dL   Bilirubin, Direct 0.0 0.0 - 0.3 mg/dL   Alkaline Phosphatase 104 39 - 117 U/L   AST 20 0 - 37 U/L   ALT 16 0 - 53 U/L   Total Protein 7.1 6.0 - 8.3 g/dL   Albumin 4.4 3.5 - 5.2 g/dL  Lipid panel     Status: None   Collection Time: 04/21/21  3:51 PM  Result Value Ref Range   Cholesterol 139 0 - 200 mg/dL    Comment: ATP III Classification       Desirable:  < 200 mg/dL               Borderline High:  200 - 239 mg/dL          High:  > = 240 mg/dL   Triglycerides 79.0 0.0 - 149.0 mg/dL    Comment: Normal:  <150 mg/dLBorderline High:  150 - 199 mg/dL   HDL 56.80 >39.00 mg/dL   VLDL 15.8 0.0 - 40.0 mg/dL   LDL Cholesterol 66 0 - 99 mg/dL   Total CHOL/HDL Ratio 2     Comment:                Men          Women1/2 Average Risk     3.4          3.3Average Risk          5.0          4.42X Average Risk          9.6          7.13X Average Risk          15.0          11.0                       NonHDL 82.07     Comment: NOTE:  Non-HDL goal  should be 30 mg/dL higher than patient's LDL goal (i.e. LDL goal of < 70 mg/dL, would have non-HDL goal of < 100 mg/dL)  Hemoglobin A1c     Status: Abnormal   Collection Time: 04/21/21  3:51 PM  Result Value Ref Range   Hgb A1c MFr Bld 7.5 (H) 4.6 - 6.5 %    Comment: Glycemic Control Guidelines for People with Diabetes:Non  Diabetic:  <6%Goal of Therapy: <7%Additional Action Suggested:  >8%   Microalbumin / creatinine urine ratio     Status: None   Collection Time: 04/21/21  3:51 PM  Result Value Ref Range   Microalb, Ur 1.1 0.0 - 1.9 mg/dL   Creatinine,U 144.7 mg/dL   Microalb Creat Ratio 0.8 0.0 - 30.0 mg/g   No results found.    Impression and Recommendations:    Assessment and Plan: 87 y.o. male with  Left medial ankle pain and bilateral lower extremity edema.  I think these are 2 discrete issues.  I think the left medial ankle pain is mostly due to posterior tibialis tendinitis secondary to his pronation and bunion.  Plan to provide arch support with scaphoid pads and home exercise program focused on eccentric strengthening of the posterior tibialis tendon.  He should avoid systemic NSAIDs given his kidney disease but I think topical Voltaren gel is reasonably safe and may be helpful.  Plan to check back for this issue in about 6 weeks.  His lower extremity edema is more of a systemic issue and could be addressed with changing his furosemide regimen intentionally.  Additionally we talked about compression stockings which she has found challenging to use previously. He seems pretty well optimized currently.  PDMP not reviewed this encounter. Orders Placed This Encounter  Procedures   [place] LIMITED JOINT SPACE STRUCTURES LOW BILAT(NO LINKED CHARGES)    Standing Status:   Future    Number of Occurrences:   1    Standing Expiration Date:   10/22/2021    Order Specific Question:   Reason for Exam (SYMPTOM  OR DIAGNOSIS REQUIRED)    Answer:   bilateral ankle pain    Order Specific Question:   Preferred imaging location?    Answer:   [place]   No orders of the defined types were placed in this encounter.   Discussed warning signs or symptoms. Please see discharge instructions. Patient expresses understanding.   The above documentation has been reviewed and is accurate  and complete Lynne Leader, M.D.   CC PCP and nephrology.

## 2021-04-24 ENCOUNTER — Ambulatory Visit: Payer: Self-pay

## 2021-04-24 ENCOUNTER — Other Ambulatory Visit: Payer: Self-pay

## 2021-04-24 ENCOUNTER — Ambulatory Visit (INDEPENDENT_AMBULATORY_CARE_PROVIDER_SITE_OTHER): Payer: Medicare Other | Admitting: Family Medicine

## 2021-04-24 VITALS — BP 146/84 | HR 60 | Ht 74.0 in | Wt 183.4 lb

## 2021-04-24 DIAGNOSIS — M25571 Pain in right ankle and joints of right foot: Secondary | ICD-10-CM

## 2021-04-24 DIAGNOSIS — R600 Localized edema: Secondary | ICD-10-CM

## 2021-04-24 DIAGNOSIS — G8929 Other chronic pain: Secondary | ICD-10-CM

## 2021-04-24 DIAGNOSIS — M25572 Pain in left ankle and joints of left foot: Secondary | ICD-10-CM

## 2021-04-24 DIAGNOSIS — M76822 Posterior tibial tendinitis, left leg: Secondary | ICD-10-CM | POA: Diagnosis not present

## 2021-04-24 NOTE — Patient Instructions (Addendum)
Thank you for coming in today.   Please use Voltaren gel (Generic Diclofenac Gel) up to 4x daily for pain as needed.  This is available over-the-counter as both the name brand Voltaren gel and the generic diclofenac gel.   Please complete the exercises that the athletic trainer went over with you:  View at [email or web address] using code: [id number]  Arch support  Recheck back in 6 weeks

## 2021-05-09 ENCOUNTER — Other Ambulatory Visit: Payer: Self-pay

## 2021-05-09 ENCOUNTER — Ambulatory Visit (INDEPENDENT_AMBULATORY_CARE_PROVIDER_SITE_OTHER): Payer: Medicare Other

## 2021-05-09 ENCOUNTER — Ambulatory Visit (INDEPENDENT_AMBULATORY_CARE_PROVIDER_SITE_OTHER): Payer: Medicare Other | Admitting: Podiatry

## 2021-05-09 ENCOUNTER — Encounter: Payer: Self-pay | Admitting: Podiatry

## 2021-05-09 DIAGNOSIS — L03116 Cellulitis of left lower limb: Secondary | ICD-10-CM

## 2021-05-09 DIAGNOSIS — L539 Erythematous condition, unspecified: Secondary | ICD-10-CM | POA: Diagnosis not present

## 2021-05-09 DIAGNOSIS — L03119 Cellulitis of unspecified part of limb: Secondary | ICD-10-CM

## 2021-05-09 DIAGNOSIS — L02612 Cutaneous abscess of left foot: Secondary | ICD-10-CM

## 2021-05-09 DIAGNOSIS — L02619 Cutaneous abscess of unspecified foot: Secondary | ICD-10-CM | POA: Diagnosis not present

## 2021-05-09 DIAGNOSIS — R601 Generalized edema: Secondary | ICD-10-CM | POA: Diagnosis not present

## 2021-05-09 MED ORDER — DOXYCYCLINE HYCLATE 100 MG PO TABS: 100.0000 mg | ORAL_TABLET | Freq: Two times a day (BID) | ORAL | 0 refills | Status: AC

## 2021-05-14 NOTE — Progress Notes (Signed)
?Subjective:  ?Patient ID: Nicholas Patton, male    DOB: 11-21-1934,  MRN: [id number] ? ?Chief Complaint  ?Patient presents with  ? Nail Problem  ?  Diabetic foot care  ? ? ?87 y.o. male presents with the above complaint.  Patient presents today with complaint of left medial ankle pain.  He states that there is some redness associated with it.  He states it is painful to walk painful to touch and he is a diabetic.  He was seen today by Dr. Adah Perl for routine foot care however there was a concern of medial ankle pain.  He is A1c was 7.4.  He denies any other acute issues.  Hurts with ambulation has progressed gotten worse.  He is not taking any antibiotics. ? ? ?Review of Systems: Negative except as noted in the HPI. Denies N/V/F/Ch. ? ?Past Medical History:  ?Diagnosis Date  ? Anemia   ? Arthritis   ? Cataract   ? REMOVED BILATERAL  ? Colon polyps   ? DIABETES MELLITUS, TYPE II   ? takes Metformin and Glimepiride daily  ? Diverticulosis   ? GERD (gastroesophageal reflux disease)   ? takes Protonix daily  ? History of colon polyps   ? HYPERLIPIDEMIA   ? takes Atorvastatin daily  ? HYPERTENSION   ? takes Amlodipine and Hyzaar daily  ? Ileus following gastrointestinal surgery ([place]) 07/22/2012  ? Joint pain   ? Peripheral neuropathy   ? Peripheral neuropathy   ? PERIPHERAL NEUROPATHY, FEET 10/06/2007  ? Renal disorder   ? VITAMIN B12 DEFICIENCY 09/06/2008  ? VITAMIN D DEFICIENCY 01/16/2010  ? takes Vitamin D daily  ? ? ?Current Outpatient Medications:  ?  doxycycline (VIBRA-TABS) 100 MG tablet, Take 1 tablet (100 mg total) by mouth 2 (two) times daily for 14 days., Disp: 28 tablet, Rfl: 0 ?  aspirin 81 MG EC tablet, Take 81 mg by mouth daily., Disp: , Rfl:  ?  atorvastatin (LIPITOR) 40 MG tablet, TAKE 1 TABLET BY MOUTH  DAILY, Disp: 90 tablet, Rfl: 3 ?  BREO ELLIPTA 200-25 MCG/ACT AEPB, USE 1 INHALATION BY MOUTH  DAILY, Disp: 180 each, Rfl: 3 ?  carvedilol (COREG) 12.5 MG tablet, TAKE 1 TABLET BY MOUTH  TWICE DAILY WITH  MEALS, Disp: 180 tablet, Rfl: 3 ?  Cholecalciferol (VITAMIN D3) 1000 UNITS CAPS, Take 1 capsule (1,000 Units total) by mouth daily., Disp: 90 capsule, Rfl: 3 ?  doxazosin (CARDURA) 2 MG tablet, Take 1 tablet (2 mg total) by mouth daily., Disp: 90 tablet, Rfl: 3 ?  finasteride (PROSCAR) 5 MG tablet, Take 5 mg by mouth daily., Disp: , Rfl:  ?  furosemide (LASIX) 20 MG tablet, Take 20 mg by mouth daily., Disp: , Rfl:  ?  glimepiride (AMARYL) 2 MG tablet, TAKE 2 TABLETS BY MOUTH  DAILY BEFORE BREAKFAST, Disp: 180 tablet, Rfl: 3 ?  glucose blood (ONE TOUCH ULTRA TEST) test strip, Use as instructed E11.9, Disp: 200 each, Rfl: 12 ?  losartan (COZAAR) 100 MG tablet, TAKE 1 TABLET BY MOUTH  DAILY, Disp: 90 tablet, Rfl: 3 ?  Multiple Vitamins-Minerals (MULTIVITAMIN PO), Take 1 tablet by mouth daily., Disp: , Rfl:  ?  pantoprazole (PROTONIX) 40 MG tablet, TAKE 1 TABLET BY MOUTH  DAILY, Disp: 90 tablet, Rfl: 3 ?  PAXIL 10 MG tablet, Take 10 mg by mouth daily., Disp: , Rfl:  ?  permethrin (ELIMITE) 5 % cream, Apply topically., Disp: , Rfl:  ?  pioglitazone (ACTOS) 15 MG  tablet, Take 1 tablet (15 mg total) by mouth daily., Disp: 90 tablet, Rfl: 3 ?  tamsulosin (FLOMAX) 0.4 MG CAPS capsule, TAKE 1 CAPSULE BY MOUTH  DAILY, Disp: 90 capsule, Rfl: 3 ?  traMADol (ULTRAM) 50 MG tablet, Take 50 mg by mouth every 6 (six) hours as needed., Disp: , Rfl:  ? ?Social History  ? ?Tobacco Use  ?Smoking Status Former  ? Packs/day: 0.00  ? Types: Cigarettes  ?Smokeless Tobacco Never  ?Tobacco Comments  ? quit smoking around 1990  ? ? ?Allergies  ?Allergen Reactions  ? Amlodipine Swelling  ? Cymbalta [Duloxetine Hcl] Other (See Comments)  ? Oxycodone Other (See Comments)  ? ?Objective:  ?There were no vitals filed for this visit. ?There is no height or weight on file to calculate BMI. ?Constitutional Well developed. ?Well nourished.  ?Vascular Dorsalis pedis pulses palpable bilaterally. ?Posterior tibial pulses palpable bilaterally. ?Capillary  refill normal to all digits.  ?No cyanosis or clubbing noted. ?Pedal hair growth normal.  ?Neurologic Normal speech. ?Oriented to person, place, and time. ?Epicritic sensation to light touch grossly present bilaterally.  ?Dermatologic Swelling noted to the left side with associated redness/erythema.  This is correlating with the medial side of the ankle.  No correlation with the ankle joint.  No normal ankle joint of range of motion noted.  No signs of septic joint noted  ?Orthopedic: Normal joint ROM without pain or crepitus bilaterally. ?No visible deformities. ?No bony tenderness.  ? ?Radiographs: None ?Assessment:  ? ?1. Generalized edema   ?2. Erythema   ? ?Plan:  ?Patient was evaluated and treated and all questions answered. ? ?Left medial ankle erythema with underlying edema/lymphedema ?-All questions and concerns were discussed with the patient in extensive detail.  Given the amount of tenderness is present with redness I believe patient benefit from antibiotics.  I will place him on doxycycline for next 2 weeks and I will see him back again in 3 weeks to reassess.  Patient will also need to aggressively elevate as well as wear compression socks to get the swelling out of the foot.  Patient states understanding ? ?Return in about 3 months (around 08/09/2021). ?

## 2021-05-20 DIAGNOSIS — H16223 Keratoconjunctivitis sicca, not specified as Sjogren's, bilateral: Secondary | ICD-10-CM | POA: Diagnosis not present

## 2021-05-20 DIAGNOSIS — H40013 Open angle with borderline findings, low risk, bilateral: Secondary | ICD-10-CM | POA: Diagnosis not present

## 2021-05-20 DIAGNOSIS — Z961 Presence of intraocular lens: Secondary | ICD-10-CM | POA: Diagnosis not present

## 2021-05-20 DIAGNOSIS — H43821 Vitreomacular adhesion, right eye: Secondary | ICD-10-CM | POA: Diagnosis not present

## 2021-05-20 DIAGNOSIS — E119 Type 2 diabetes mellitus without complications: Secondary | ICD-10-CM | POA: Diagnosis not present

## 2021-05-20 DIAGNOSIS — H11823 Conjunctivochalasis, bilateral: Secondary | ICD-10-CM | POA: Diagnosis not present

## 2021-05-20 DIAGNOSIS — H4322 Crystalline deposits in vitreous body, left eye: Secondary | ICD-10-CM | POA: Diagnosis not present

## 2021-05-20 LAB — HM DIABETES EYE EXAM

## 2021-05-28 ENCOUNTER — Ambulatory Visit: Payer: Medicare Other | Admitting: Podiatry

## 2021-05-30 DIAGNOSIS — D631 Anemia in chronic kidney disease: Secondary | ICD-10-CM | POA: Diagnosis not present

## 2021-05-30 DIAGNOSIS — N189 Chronic kidney disease, unspecified: Secondary | ICD-10-CM | POA: Diagnosis not present

## 2021-05-30 DIAGNOSIS — N2581 Secondary hyperparathyroidism of renal origin: Secondary | ICD-10-CM | POA: Diagnosis not present

## 2021-05-30 DIAGNOSIS — R600 Localized edema: Secondary | ICD-10-CM | POA: Diagnosis not present

## 2021-05-30 DIAGNOSIS — N1832 Chronic kidney disease, stage 3b: Secondary | ICD-10-CM | POA: Diagnosis not present

## 2021-05-30 DIAGNOSIS — I129 Hypertensive chronic kidney disease with stage 1 through stage 4 chronic kidney disease, or unspecified chronic kidney disease: Secondary | ICD-10-CM | POA: Diagnosis not present

## 2021-06-05 DIAGNOSIS — L298 Other pruritus: Secondary | ICD-10-CM | POA: Diagnosis not present

## 2021-06-06 NOTE — Progress Notes (Signed)
? ?  Rito Ehrlich, am serving as a Education administrator for Dr. Lynne Leader. ? ?Nicholas Patton is a 87 y.o. male who presents to [place] at [facility] today for f/u of B ankle pain, L>R, due to posterior tibialis tendinitis secondary to his pronation and bunion.  He was last seen by Dr. Georgina Snell on 04/24/21 and was provided scaphoid pads for arch support and was shown a HEP focusing on post tib eccentrics.  He was also advised to use Voltaren gel.  Today, pt reports that his ankles were doing pretty good but has noticed that his left medial ankle has been hurting and feeling very tight.  ? ?Diagnostic testing: L foot XR- 05/09/21  ? ?Pertinent review of systems: No fevers or chills ? ?Relevant historical information: Lower extremity edema ? ? ?Exam:  ?BP 120/70   Pulse (!) 52   Ht 6' 2" (1.88 m)   Wt 185 lb (83.9 kg)   SpO2 93%   BMI 23.75 kg/m?  ?General: Well Developed, well nourished, and in no acute distress.  ? ?MSK: Left medial ankle skin hyperpigmentation along medial ankle with some skin peeling.  Mildly tender palpation.  Lower extremity edema 1+ nonpitting edema. ? ? ? ?Lab and Radiology Results ? ?X-ray images left foot obtained at podiatry on March 3 personally independently interpreted showing mild ankle DJD. ? ? ? ? ?Assessment and Plan: ?87 y.o. male with left medial ankle pain.  Patient was seen 6 weeks ago for this issue and thought to be multifactorial due to posterior tibialis tendinitis but mostly due to lower extremity edema.  Since then he has been seen by podiatry and thought to have venous stasis dermatitis with cellulitis and treated with doxycycline.  I think at this point the majority of his pain is due to the edema.  Spent a lot of time talking about edema control.  He already is very well medically optimized.  The only next treatment option I think should be compression stockings.  He has had trouble using them in the past.  I discussed with the patient (in person) and his  daughter (over the phone) that a less than medically ideal compression stockings that he can actually wear and use is going to be much better than a perfect compression stocking medically that he can use.  I recommend that he try a variety of light compression stockings and see which when he can actually get on himself and use it as consistently as possible. ? ?His nephrologist has referred him to vascular surgery.  I think it is likely that they are going to recommend compression stockings but I do anticipate and look forward to their recommendations. ? ?Check back with me as needed. ? ? ? ?Discussed warning signs or symptoms. Please see discharge instructions. Patient expresses understanding. ? ? ?The above documentation has been reviewed and is accurate and complete Lynne Leader, M.D. ? ? ?

## 2021-06-09 ENCOUNTER — Ambulatory Visit (INDEPENDENT_AMBULATORY_CARE_PROVIDER_SITE_OTHER): Payer: Medicare Other | Admitting: Family Medicine

## 2021-06-09 VITALS — BP 120/70 | HR 52 | Ht 74.0 in | Wt 185.0 lb

## 2021-06-09 DIAGNOSIS — R600 Localized edema: Secondary | ICD-10-CM | POA: Diagnosis not present

## 2021-06-09 DIAGNOSIS — M25572 Pain in left ankle and joints of left foot: Secondary | ICD-10-CM | POA: Diagnosis not present

## 2021-06-09 DIAGNOSIS — G8929 Other chronic pain: Secondary | ICD-10-CM | POA: Diagnosis not present

## 2021-06-09 NOTE — Patient Instructions (Addendum)
Good to see you  ?Light compression stocking ?If your leg gets red and swollen call and we can get you in really fast  ?Follow up as needed ?

## 2021-06-24 NOTE — Progress Notes (Signed)
? ?  I, Wendy Poet, LAT, ATC, am serving as scribe for Dr. Lynne Leader. ? ?Nicholas Patton is a 87 y.o. male who presents to [place] at [facility] today for f/u of B ankle pain, L>R, due to posterior tibialis tendinitis secondary to his pronation and bunion.  He was last seen by Dr. Georgina Snell on 06/09/21 and noted overall improvement although his L medial ankle was bothering him a bit more.  He was advised to use compression stockings as much as possible to help w/ venous stasis dermatitis with cellulitis as diagnosed by podiatry.  He has also been referred to vascular sx by nephrology.  Today, pt reports both ankles are still painful, L>R. Pt notes the area of pain has increased pain along the medial aspect of the L ankle and lower leg. Pt reports the dark area is moving more proximally.  He notes this become more painful and erythematous. ? ?Diagnostic testing: L foot XR- 05/09/21; LE venous doppler US- 11/19/20 ? ?Pertinent review of systems: No fevers or chills ? ?Relevant historical information: Diabetes ? ? ?Exam:  ?BP (!) 142/78   Pulse (!) 57   Ht _0  (1.88 m)   Wt 190 lb 9.6 oz (86.5 kg)   SpO2 94%   BMI 24.47 kg/m?  ?General: Well Developed, well nourished, and in no acute distress.  ? ?MSK: Left medial ankle edematous with area of hyperpigmentation and now erythema.  Tender to palpation anterior medial ankle. ?Normal ankle motion and strength. ? ? ? ?Assessment and Plan: ?87 y.o. male with left ankle edema with area of prior venous stasis dermatitis with now either exacerbation of venous stasis dermatitis or potential cellulitis.  Plan for short course of doxycycline and also expand work-up with venous reflux vascular ultrasound.  Recheck after the reflux study is back. ? ? ?PDMP not reviewed this encounter. ?No orders of the defined types were placed in this encounter. ? ?Meds ordered this encounter  ?Medications  ? doxycycline (VIBRA-TABS) 100 MG tablet  ?  Sig: Take 1 tablet (100 mg  total) by mouth 2 (two) times daily for 14 days.  ?  Dispense:  28 tablet  ?  Refill:  0  ? ? ? ?Discussed warning signs or symptoms. Please see discharge instructions. Patient expresses understanding. ? ? ?The above documentation has been reviewed and is accurate and complete Lynne Leader, M.D. ? ? ?

## 2021-06-25 ENCOUNTER — Ambulatory Visit (INDEPENDENT_AMBULATORY_CARE_PROVIDER_SITE_OTHER): Payer: Medicare Other | Admitting: Family Medicine

## 2021-06-25 VITALS — BP 142/78 | HR 57 | Ht 74.0 in | Wt 190.6 lb

## 2021-06-25 DIAGNOSIS — R600 Localized edema: Secondary | ICD-10-CM

## 2021-06-25 DIAGNOSIS — I872 Venous insufficiency (chronic) (peripheral): Secondary | ICD-10-CM | POA: Diagnosis not present

## 2021-06-25 DIAGNOSIS — L03116 Cellulitis of left lower limb: Secondary | ICD-10-CM | POA: Diagnosis not present

## 2021-06-25 MED ORDER — DOXYCYCLINE HYCLATE 100 MG PO TABS: 100.0000 mg | ORAL_TABLET | Freq: Two times a day (BID) | ORAL | 0 refills | Status: AC

## 2021-06-25 NOTE — Patient Instructions (Addendum)
Thank you for coming in today.  ? ?I've ordered a reflux study. ? ?Recheck after complete ? ? ?

## 2021-07-03 ENCOUNTER — Other Ambulatory Visit: Payer: Self-pay | Admitting: Internal Medicine

## 2021-07-03 NOTE — Telephone Encounter (Signed)
Please refill as per office routine med refill policy (all routine meds to be refilled for 3 mo or monthly (per pt preference) up to one year from last visit, then month to month grace period for 3 mo, then further med refills will have to be denied) ? ?

## 2021-07-08 ENCOUNTER — Other Ambulatory Visit: Payer: Self-pay

## 2021-07-08 DIAGNOSIS — I8393 Asymptomatic varicose veins of bilateral lower extremities: Secondary | ICD-10-CM

## 2021-07-09 ENCOUNTER — Telehealth: Payer: Self-pay | Admitting: Family Medicine

## 2021-07-09 ENCOUNTER — Ambulatory Visit (INDEPENDENT_AMBULATORY_CARE_PROVIDER_SITE_OTHER): Payer: Medicare Other | Admitting: Physician Assistant

## 2021-07-09 ENCOUNTER — Ambulatory Visit (HOSPITAL_COMMUNITY)
Admission: RE | Admit: 2021-07-09 | Discharge: 2021-07-09 | Disposition: A | Discharge status: Home / Self Care | Payer: Medicare Other | Source: Ambulatory Visit | Attending: Physician Assistant | Admitting: Physician Assistant

## 2021-07-09 VITALS — BP 147/63 | HR 54 | Temp 97.6°F | Resp 20 | Ht 74.0 in | Wt 187.5 lb

## 2021-07-09 DIAGNOSIS — I8393 Asymptomatic varicose veins of bilateral lower extremities: Secondary | ICD-10-CM | POA: Insufficient documentation

## 2021-07-09 DIAGNOSIS — I872 Venous insufficiency (chronic) (peripheral): Secondary | ICD-10-CM

## 2021-07-09 NOTE — Telephone Encounter (Signed)
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 This does not require a pre-cert. Note added to order and left message informing Damita Dunnings. ?

## 2021-07-09 NOTE — Addendum Note (Signed)
Addended by: Pollyann Glen on: 07/09/2021 02:34 PM ? ? Modules accepted: Orders ? ?

## 2021-07-09 NOTE — Progress Notes (Signed)
? ? ?Requested by:  ?Rosita Fire, MD ?[address] ?[place],  [place] [address] ? ?Reason for consultation: edema of extremities  ? ? ?History of Present Illness  ? ?Nicholas Patton is a 87 y.o. (06-26-1934) male who presents for evaluation of left leg swelling, skin changes and tenderness. He presents today with his daughter. He says that his swelling began several months ago. The swelling is mostly around left ankle up to mid calf. He has noticed some darkening of his skin in this area as well as  scaling of his skin and soreness. Initially he was seen by his PCP and was given lasix for the edema. This was somewhat helpful initially, but the swelling continued in Left greater than right leg. He does have CKD so it was attributed to this. He then was seen by Orthopedics who felt it was possibly related to his tendons. Then in follow up had some redness around the left ankle so was treated for 14 days with antibiotics with concerns of cellulitis. He explains that usually the swelling is improved upon first waking but worsens as day goes on. He does not regularly elevate and it is not above level of his heart. He has recently started wearing a light knee high OTC compression sock. He says he feels this is helping a little bit. He does not regularly exercise. He reports no history of DVT. No family history of DVT or venous disease.  ? ?Venous symptoms include: swelling, darkening of skin, tenderness around ankle ?Onset/duration:  several months  ?Occupation:  retired ?Aggravating factors: sitting, standing ?Alleviating factors: elevation, compression ?Compression:  yes Helps:  yes ?Pain medications:  no ?Previous vein procedures:  no ?History of DVT:  no ? ?Past Medical History:  ?Diagnosis Date  ? Anemia   ? Arthritis   ? Cataract   ? REMOVED BILATERAL  ? Colon polyps   ? DIABETES MELLITUS, TYPE II   ? takes Metformin and Glimepiride daily  ? Diverticulosis   ? GERD (gastroesophageal reflux disease)   ? takes  Protonix daily  ? History of colon polyps   ? HYPERLIPIDEMIA   ? takes Atorvastatin daily  ? HYPERTENSION   ? takes Amlodipine and Hyzaar daily  ? Ileus following gastrointestinal surgery ([place]) 07/22/2012  ? Joint pain   ? Peripheral neuropathy   ? Peripheral neuropathy   ? PERIPHERAL NEUROPATHY, FEET 10/06/2007  ? Renal disorder   ? VITAMIN B12 DEFICIENCY 09/06/2008  ? VITAMIN D DEFICIENCY 01/16/2010  ? takes Vitamin D daily  ? ? ?Past Surgical History:  ?Procedure Laterality Date  ? CATARACT EXTRACTION    ? both eyes  ? CHOLECYSTECTOMY N/A 07/18/2012  ? Procedure: LAPAROSCOPIC CHOLECYSTECTOMY WITH INTRAOPERATIVE CHOLANGIOGRAM;  Surgeon: Zenovia Jarred, MD;  Location: [facility];  Service: General;  Laterality: N/A;  ? COLONOSCOPY    ? POLYPECTOMY    ? POSTERIOR CERVICAL FUSION/FORAMINOTOMY N/A 06/27/2015  ? Procedure: Posterior Cervical Fusion with lateral mass fixation Cervical four to cervical seven;  Surgeon: Eustace Moore, MD;  Location: [facility];  Service: Neurosurgery;  Laterality: N/A;  ? TOTAL KNEE ARTHROPLASTY  2001  ? Left  ? TURP VAPORIZATION    ? ? ?Social History  ? ?Socioeconomic History  ? Marital status: Married  ?  Spouse name: Not on file  ? Number of children: Not on file  ? Years of education: Not on file  ? Highest education level: Not on file  ?Occupational History  ?  Not on file  ?Tobacco Use  ? Smoking status: Former  ?  Packs/day: 0.00  ?  Types: Cigarettes  ?  Passive exposure: Never  ? Smokeless tobacco: Never  ? Tobacco comments:  ?  quit smoking around 1990  ?Vaping Use  ? Vaping Use: Never used  ?Substance and Sexual Activity  ? Alcohol use: No  ?  Alcohol/week: 0.0 standard drinks  ? Drug use: No  ? Sexual activity: Never  ?Other Topics Concern  ? Not on file  ?Social History Narrative  ? Not on file  ? ?Social Determinants of Health  ? ?Financial Resource Strain: Not on file  ?Food Insecurity: Not on file  ?Transportation Needs: Not on file  ?Physical Activity: Not on file  ?Stress: Not  on file  ?Social Connections: Not on file  ?Intimate Partner Violence: Not on file  ? ? ?Family History  ?Problem Relation Age of Onset  ? Diabetes Mellitus II Mother   ? Lung cancer Brother   ? Colon cancer Neg Hx   ? Esophageal cancer Neg Hx   ? Rectal cancer Neg Hx   ? Stomach cancer Neg Hx   ? ? ?Current Outpatient Medications  ?Medication Sig Dispense Refill  ? aspirin 81 MG EC tablet Take 81 mg by mouth daily.    ? atorvastatin (LIPITOR) 40 MG tablet TAKE 1 TABLET BY MOUTH  DAILY 90 tablet 3  ? BREO ELLIPTA 200-25 MCG/ACT AEPB USE 1 INHALATION BY MOUTH  DAILY 180 each 3  ? carvedilol (COREG) 12.5 MG tablet TAKE 1 TABLET BY MOUTH  TWICE DAILY WITH MEALS 180 tablet 3  ? Cholecalciferol (VITAMIN D3) 1000 UNITS CAPS Take 1 capsule (1,000 Units total) by mouth daily. 90 capsule 3  ? doxazosin (CARDURA) 2 MG tablet Take 1 tablet (2 mg total) by mouth daily. 90 tablet 3  ? doxycycline (VIBRA-TABS) 100 MG tablet Take 1 tablet (100 mg total) by mouth 2 (two) times daily for 14 days. 28 tablet 0  ? finasteride (PROSCAR) 5 MG tablet Take 5 mg by mouth daily.    ? furosemide (LASIX) 20 MG tablet Take 20 mg by mouth daily.    ? glimepiride (AMARYL) 2 MG tablet TAKE 2 TABLETS BY MOUTH  DAILY BEFORE BREAKFAST 180 tablet 3  ? glucose blood (ONE TOUCH ULTRA TEST) test strip Use as instructed E11.9 200 each 12  ? losartan (COZAAR) 100 MG tablet TAKE 1 TABLET BY MOUTH  DAILY 90 tablet 3  ? Multiple Vitamins-Minerals (MULTIVITAMIN PO) Take 1 tablet by mouth daily.    ? pantoprazole (PROTONIX) 40 MG tablet TAKE 1 TABLET BY MOUTH  DAILY 90 tablet 3  ? PAXIL 10 MG tablet Take 10 mg by mouth daily.    ? permethrin (ELIMITE) 5 % cream Apply topically.    ? pioglitazone (ACTOS) 15 MG tablet TAKE 1 TABLET BY MOUTH  DAILY 90 tablet 0  ? tamsulosin (FLOMAX) 0.4 MG CAPS capsule TAKE 1 CAPSULE BY MOUTH  DAILY 90 capsule 3  ? ?No current facility-administered medications for this visit.  ? ? ?Allergies  ?Allergen Reactions  ? Amlodipine  Swelling  ? Cymbalta [Duloxetine Hcl] Other (See Comments)  ? Oxycodone Other (See Comments)  ? ? ?REVIEW OF SYSTEMS (negative unless checked):  ? ?Cardiac:  ?_0  Chest pain or chest pressure? ?_1  Shortness of breath upon activity? ?_2  Shortness of breath when lying flat? ?_3  Irregular heart rhythm? ? ?Vascular:  ?_4  Pain in calf, thigh, or hip brought  on by walking? ?_0  Pain in feet at night that wakes you up from your sleep? ?_1  Blood clot in your veins? ?_2  Leg swelling? ? ?Pulmonary:  ?_3  Oxygen at home? ?_4  Productive cough? ?_5  Wheezing? ? ?Neurologic:  ?_6  Sudden weakness in arms or legs? ?_7  Sudden numbness in arms or legs? ?_8  Sudden onset of difficult speaking or slurred speech? ?_9  Temporary loss of vision in one eye? ?_10  Problems with dizziness? ? ?Gastrointestinal:  ?_11  Blood in stool? ?_12  Vomited blood? ? ?Genitourinary:  ?_13  Burning when urinating? ?_14  Blood in urine? ? ?Psychiatric:  ?_15  Major depression ? ?Hematologic:  ?_16  Bleeding problems? ?_17  Problems with blood clotting? ? ?Dermatologic:  ?_18  Rashes or ulcers? ? ?Constitutional:  ?_19  Fever or chills? ? ?Ear/Nose/Throat:  ?_20  Change in hearing? ?_21  Nose bleeds? ?_22  Sore throat? ? ?Musculoskeletal:  ?_23  Back pain? ?_24  Joint pain? ?_25  Muscle pain? ? ? ?Physical Examination  ?  ? ?Vitals:  ? 07/09/21 0851  ?BP: (!) 147/63  ?Pulse: (!) 54  ?Resp: 20  ?Temp: 97.6 ?F (36.4 ?C)  ?TempSrc: Temporal  ?SpO2: 94%  ?Weight: 187 lb 8 oz (85 kg)  ?Height: _26  (1.88 m)  ? ?Body mass index is 24.07 kg/m?. ? ?General:  WDWN in NAD; vital signs documented above ?Gait: Not observed ?HENT: WNL, normocephalic ?Pulmonary: normal non-labored breathing , without wheezing ?Cardiac: regular HR, without  Murmurs without carotid bruit ?Abdomen: soft, NT, no masses ?Vascular Exam/Pulses: ? Right Left  ?Radial 2+ (normal) 2+ (normal)  ?Femoral 2+ (normal) 2+ (normal)  ?Popliteal 2+ (normal) 2+ (normal)  ?DP 2+ (normal) 2+ (normal)  ?PT 1+ (weak) 1+ (weak)  ? ?Extremities:  with varicose veins left medial thigh, with  minimal reticular veins, with edema bilateral ankles, with stasis pigmentation left > right ankle, without lipodermatosclerosis, without ulcers ?Musculoskeletal: no

## 2021-07-09 NOTE — Telephone Encounter (Signed)
Message received from [facility] at [facility] called following up on the authorization needed for the VAS [place] LOWER EXTREMITY VENOUS REFLUX that was ordered by Dr Georgina Snell. ?Do you know anything about this or can you help? ? Damita Dunnings # [phone] ? ? ?

## 2021-07-15 ENCOUNTER — Ambulatory Visit: Payer: Medicare Other | Admitting: Internal Medicine

## 2021-07-19 ENCOUNTER — Other Ambulatory Visit: Payer: Self-pay | Admitting: Internal Medicine

## 2021-07-22 ENCOUNTER — Other Ambulatory Visit: Payer: Self-pay | Admitting: Pulmonary Disease

## 2021-08-13 ENCOUNTER — Ambulatory Visit (INDEPENDENT_AMBULATORY_CARE_PROVIDER_SITE_OTHER): Payer: Medicare Other | Admitting: Podiatry

## 2021-08-13 ENCOUNTER — Encounter: Payer: Self-pay | Admitting: Podiatry

## 2021-08-13 DIAGNOSIS — M79674 Pain in right toe(s): Secondary | ICD-10-CM

## 2021-08-13 DIAGNOSIS — E1142 Type 2 diabetes mellitus with diabetic polyneuropathy: Secondary | ICD-10-CM

## 2021-08-13 DIAGNOSIS — M79675 Pain in left toe(s): Secondary | ICD-10-CM | POA: Diagnosis not present

## 2021-08-13 DIAGNOSIS — B351 Tinea unguium: Secondary | ICD-10-CM

## 2021-08-13 NOTE — Progress Notes (Signed)
Subjective: Nicholas Patton is a 87 y.o. male patient seen today for follow up of  painful thick toenails that are difficult to trim. Pain interferes with ambulation. Aggravating factors include wearing enclosed shoe gear. Pain is relieved with periodic professional debridement.  New problems reported today: None.  Patient states their blood glucose was 130 mg/dl today.   PCP is [name], MD. Last visit was: 01/14/2021.  Allergies  Allergen Reactions   Amlodipine Swelling   Cymbalta [Duloxetine Hcl] Other (See Comments)   Oxycodone Other (See Comments)    Objective: Physical Exam  General: Patient is a pleasant 87 y.o. African American male WD, WN in NAD. AAO x 3.   Neurovascular Examination: Capillary refill time to digits immediate b/l. Palpable DP pulse(s) b/l LE. Palpable PT pulse(s) b/l LE. Pedal hair absent. Nonpitting edema noted right lower extremity.  Pt has subjective symptoms of neuropathy. Protective sensation intact 5/5 intact bilaterally with 10g monofilament b/l. Vibratory sensation intact b/l.  Dermatological:  Pedal skin is warm and supple b/l LE. No open wounds b/l LE. No interdigital macerations noted b/l LE. Toenails 1-5 b/l elongated, discolored, dystrophic, thickened, crumbly with subungual debris and tenderness to dorsal palpation. Incurvated nailplate medial border left 2nd toe and lateral border left hallux  Nail border hypertrophy minimal. There is tenderness to palpation. Sign(s) of infection: no clinical signs of infection noted on examination today.. Hyperkeratotic lesion(s) bilateral great toes and submet head 1 left foot.  No erythema, no edema, no drainage, no fluctuance.  Musculoskeletal:  Muscle strength 5/5 to all lower extremity muscle groups bilaterally. No pain, crepitus or joint limitation noted with ROM bilateral LE. HAV with bunion deformity noted b/l LE. Hammertoe deformity noted 2-5 b/l.  Assessment: No diagnosis  found.   Plan: Patient was evaluated and treated and all questions answered. Consent given for treatment as described below: -Continue foot and shoe inspections daily. Monitor blood glucose per PCP/Endocrinologist's recommendations. -Mycotic toenails 1-5 bilaterally were debrided in length and girth with sterile nail nippers and dremel without incident. -Offending nail border debrided and curretaged L hallux and L 2nd toe utilizing sterile nail nipper and currette. Border cleansed with alcohol and triple antibiotic applied. No further treatment required by patient/caregiver. -Callus(es) bilateral great toes and submet head 1 left foot pared utilizing sterile scalpel blade without complication or incident. Total number debrided =3. -Patient/POA to call should there be question/concern in the interim.  Return in about 3 months (around 11/13/2021).  Felipa Furnace, DPM

## 2021-08-19 DIAGNOSIS — N1832 Chronic kidney disease, stage 3b: Secondary | ICD-10-CM | POA: Diagnosis not present

## 2021-08-29 DIAGNOSIS — D631 Anemia in chronic kidney disease: Secondary | ICD-10-CM | POA: Diagnosis not present

## 2021-08-29 DIAGNOSIS — D472 Monoclonal gammopathy: Secondary | ICD-10-CM | POA: Diagnosis not present

## 2021-08-29 DIAGNOSIS — N2581 Secondary hyperparathyroidism of renal origin: Secondary | ICD-10-CM | POA: Diagnosis not present

## 2021-08-29 DIAGNOSIS — I129 Hypertensive chronic kidney disease with stage 1 through stage 4 chronic kidney disease, or unspecified chronic kidney disease: Secondary | ICD-10-CM | POA: Diagnosis not present

## 2021-08-29 DIAGNOSIS — N184 Chronic kidney disease, stage 4 (severe): Secondary | ICD-10-CM | POA: Diagnosis not present

## 2021-08-29 DIAGNOSIS — R600 Localized edema: Secondary | ICD-10-CM | POA: Diagnosis not present

## 2021-09-18 ENCOUNTER — Encounter: Payer: Self-pay | Admitting: Pulmonary Disease

## 2021-09-18 ENCOUNTER — Ambulatory Visit (INDEPENDENT_AMBULATORY_CARE_PROVIDER_SITE_OTHER): Payer: Medicare Other | Admitting: Pulmonary Disease

## 2021-09-18 VITALS — BP 128/68 | HR 51 | Temp 98.8°F | Ht 74.0 in | Wt 193.0 lb

## 2021-09-18 DIAGNOSIS — J4520 Mild intermittent asthma, uncomplicated: Secondary | ICD-10-CM

## 2021-09-18 DIAGNOSIS — R0600 Dyspnea, unspecified: Secondary | ICD-10-CM

## 2021-09-18 NOTE — Patient Instructions (Signed)
Nice to see you again  Continue the breo  Use albuterol as a rescue inhaler - have one with you wherever you go  Return to clinic in 6 months or sooner as needed

## 2021-09-18 NOTE — Progress Notes (Signed)
Patient ID: Nicholas Patton, male    DOB: [birth year], 87 y.o.   MRN: [id number]  Chief Complaint  Patient presents with   Follow-up    Pt is here for follow up for DOE. Pt states that his breathing has stated the same since last visit. Pt is currently on Breo. He states that it does help his DOE and he uses his Albuterol at times when needed     Referring provider: [name], MD  HPI:   Nicholas Patton is a 87 y.o. man with reported "COPD" with spirometry without fixed obstruction but with significant bronchodilator response whom we are seeing in follow up for asthma, DOE.    Returns for routine follow-up.  Overall, breathing stable.  Continues Breo with improvement in overall symptoms.  Not using albuterol very frequently. Reviewed signs of pulmonary HTN on TTE in 2021.  Discussed at length given significant emphysematous changes on CT scan, he is a poor candidate for systemic vasodilators.  No data for Tyvaso in the setting of emphysema.  PFTs reviewed.  HPI at initial visit: Has had some mild dyspnea exertion going on some time now.  At least months to years.  He has been told he has COPD in the past but has never had spirometry.  Shortness of breath worse walking longer distances or on inclines.  Rest resolve symptoms.  No real shortness of breath at rest.  He has hayfever, seasonal allergies regularly.  Will use medicines to treat this when bad.  No recurrent pneumonias in the past no real bad bouts of bronchitis.  No asthma or exertional limitation as a child.  No syncope or presyncope.  Reportedly has occasionally received prednisone for some breathing difficulties in the past.  Is been at least 2 years or more since last use.  Spirometry recently demonstrates ratio at 68 to 69% but given his age this is over 85% predicted and within normal limits.  Significant bronchodilator response in both FEV1 and FVC.  CT scan 05/2019 personally reviewed and interpreted as diffuse significant  upper lobe predominant emphysema without other significant abnormality.  PMH: Diabetes, hypertension Surgical history: Cataracts, neck surgery/fusion next knee replacement, cholecystectomy Family history: No history of lung cancer Social history: Former smoker, quit late 80s, lives in [place] / Pulmonary Flowsheets:   ACT:      No data to display          MMRC: mMRC Dyspnea Scale mMRC Score  11/18/2020  4:28 PM 3  05/15/2020  1:35 PM 0    [place]:      No data to display          Tests:   FENO:  No results found for: "NITRICOXIDE"  PFT:    Latest Ref Rng & Units 10/04/2019   10:53 AM  PFT Results  FVC-Pre L 3.21  P  FVC-Predicted Pre % 78  P  FVC-Post L 3.67  P  FVC-Predicted Post % 90  P  Pre FEV1/FVC % % 68  P  Post FEV1/FCV % % 69  P  FEV1-Pre L 2.18  P  FEV1-Predicted Pre % 73  P  FEV1-Post L 2.55  P  DLCO uncorrected ml/min/mmHg 12.86  P  DLCO UNC% % 49  P  DLCO corrected ml/min/mmHg 12.94  P  DLCO COR %Predicted % 49  P  DLVA Predicted % 50  P  TLC L 7.34  P  TLC % Predicted % 95  P  RV % Predicted % 120  P    P Preliminary result   Personally reviewed and interpreted as normal spirometry with significant bronchodilator response.  TLC within normal limits, RV gas trapping.  DLCO severely reduced.  WALK:      No data to display          Imaging: No results found.  Lab Results:  CBC    Component Value Date/Time   WBC 4.1 04/21/2021 1551   RBC 4.64 04/21/2021 1551   HGB 13.6 04/21/2021 1551   HGB 12.9 (L) 10/23/2020 1014   HCT 42.4 04/21/2021 1551   PLT 152.0 04/21/2021 1551   PLT 150 10/23/2020 1014   MCV 91.3 04/21/2021 1551   MCH 30.1 10/23/2020 1014   MCHC 32.1 04/21/2021 1551   RDW 15.2 04/21/2021 1551   LYMPHSABS 1.2 04/21/2021 1551   MONOABS 0.5 04/21/2021 1551   EOSABS 0.1 04/21/2021 1551   BASOSABS 0.0 04/21/2021 1551    BMET    Component Value Date/Time   NA 143 04/21/2021 1551   K 4.1  04/21/2021 1551   CL 108 04/21/2021 1551   CO2 26 04/21/2021 1551   GLUCOSE 116 (H) 04/21/2021 1551   BUN 47 (H) 04/21/2021 1551   CREATININE 2.29 (H) 04/21/2021 1551   CREATININE 2.06 (H) 10/23/2020 1014   CREATININE 1.68 (H) 10/04/2019 1115   CALCIUM 10.3 04/21/2021 1551   GFRNONAA 31 (L) 10/23/2020 1014   GFRNONAA 37 (L) 10/04/2019 1115   GFRAA 41 (L) 10/25/2019 1052   GFRAA 43 (L) 10/04/2019 1115    BNP    Component Value Date/Time   BNP 48.5 05/18/2019 1918    ProBNP No results found for: "PROBNP"  Specialty Problems       Pulmonary Problems   Allergic rhinitis    Qualifier: Diagnosis of  By: [name] MD, Hunt Oris       Acute upper respiratory infection   Dyspnea   Left-sided nosebleed   Asthma   Acute cough    Allergies  Allergen Reactions   Amlodipine Swelling   Cymbalta [Duloxetine Hcl] Other (See Comments)   Oxycodone Other (See Comments)    Immunization History  Administered Date(s) Administered   Fluad Quad(high Dose 65+) 11/09/2018, 11/30/2019, 12/14/2020   Influenza Whole 11/21/2015   Influenza, High Dose Seasonal PF 01/24/2013, 12/01/2016, 11/22/2017   Influenza,inj,Quad PF,6+ Mos 12/08/2013   Influenza-Unspecified 02/08/2015   PFIZER(Purple Top)SARS-COV-2 Vaccination 04/03/2019, 04/10/2019, 12/23/2019, 10/10/2020   Pneumococcal Conjugate-13 03/22/2013   Pneumococcal Polysaccharide-23 01/10/2016   Td 10/05/2014    Past Medical History:  Diagnosis Date   Anemia    Arthritis    Cataract    REMOVED BILATERAL   Colon polyps    DIABETES MELLITUS, TYPE II    takes Metformin and Glimepiride daily   Diverticulosis    GERD (gastroesophageal reflux disease)    takes Protonix daily   History of colon polyps    HYPERLIPIDEMIA    takes Atorvastatin daily   HYPERTENSION    takes Amlodipine and Hyzaar daily   Ileus following gastrointestinal surgery ([place]) 07/22/2012   Joint pain    Peripheral neuropathy    Peripheral neuropathy    PERIPHERAL  NEUROPATHY, FEET 10/06/2007   Renal disorder    VITAMIN B12 DEFICIENCY 09/06/2008   VITAMIN D DEFICIENCY 01/16/2010   takes Vitamin D daily    Tobacco History: Social History   Tobacco Use  Smoking Status Former   Packs/day: 0.00   Types: Cigarettes  Passive exposure: Never  Smokeless Tobacco Never  Tobacco Comments   quit smoking around 1990   Counseling given: Not Answered Tobacco comments: quit smoking around 1990    Continue to not smoke  Outpatient Encounter Medications as of 09/18/2021  Medication Sig   albuterol (VENTOLIN HFA) 108 (90 Base) MCG/ACT inhaler INHALE 2 PUFFS BY MOUTH EVERY 6 HOURS AS NEEDED FOR WHEEZING FOR SHORTNESS OF BREATH   aspirin 81 MG EC tablet Take 81 mg by mouth daily.   atorvastatin (LIPITOR) 40 MG tablet TAKE 1 TABLET BY MOUTH  DAILY   BREO ELLIPTA 200-25 MCG/ACT AEPB USE 1 INHALATION BY MOUTH  DAILY   carvedilol (COREG) 12.5 MG tablet TAKE 1 TABLET BY MOUTH  TWICE DAILY WITH MEALS   Cholecalciferol (VITAMIN D3) 1000 UNITS CAPS Take 1 capsule (1,000 Units total) by mouth daily.   doxazosin (CARDURA) 2 MG tablet Take 1 tablet (2 mg total) by mouth daily.   finasteride (PROSCAR) 5 MG tablet Take 5 mg by mouth daily.   furosemide (LASIX) 20 MG tablet Take 20 mg by mouth daily.   glimepiride (AMARYL) 2 MG tablet TAKE 2 TABLETS BY MOUTH  DAILY BEFORE BREAKFAST   glucose blood (ONE TOUCH ULTRA TEST) test strip Use as instructed E11.9   losartan (COZAAR) 100 MG tablet TAKE 1 TABLET BY MOUTH  DAILY   Multiple Vitamins-Minerals (MULTIVITAMIN PO) Take 1 tablet by mouth daily.   pantoprazole (PROTONIX) 40 MG tablet TAKE 1 TABLET BY MOUTH  DAILY   PAXIL 10 MG tablet Take 10 mg by mouth daily.   permethrin (ELIMITE) 5 % cream Apply topically.   pioglitazone (ACTOS) 15 MG tablet TAKE 1 TABLET BY MOUTH  DAILY   tamsulosin (FLOMAX) 0.4 MG CAPS capsule TAKE 1 CAPSULE BY MOUTH  DAILY   No facility-administered encounter medications on file as of 09/18/2021.      Review of Systems  Review of Systems  N/a Physical Exam  BP 128/68 (BP Location: Left Arm, Patient Position: Sitting, Cuff Size: Normal)   Pulse (!) 51   Temp 98.8 F (37.1 C) (Oral)   Ht _0  (1.88 m)   Wt 193 lb (87.5 kg)   SpO2 96%   BMI 24.78 kg/m   Wt Readings from Last 5 Encounters:  09/18/21 193 lb (87.5 kg)  07/09/21 187 lb 8 oz (85 kg)  06/25/21 190 lb 9.6 oz (86.5 kg)  06/09/21 185 lb (83.9 kg)  04/24/21 183 lb 6.4 oz (83.2 kg)    BMI Readings from Last 5 Encounters:  09/18/21 24.78 kg/m  07/09/21 24.07 kg/m  06/25/21 24.47 kg/m  06/09/21 23.75 kg/m  04/24/21 23.55 kg/m     Physical Exam General: Well-appearing, in no acute distress Eyes: EOMI, no icterus Neck: Supple, no JVP appreciated sitting upright Respiratory: Clear to auscultation bilaterally, normal work of breathing on room air Extremities: Warm, no edema MSK: No synovitis, no joint effusions Neuro: Normal gait, no weakness Psych: Normal mood, full affect   Assessment & Plan:   Nicholas Patton is a 87 y.o. man with reported COPD with recent spirometry without fixed obstruction but with significant bronchodilator response whom we are seeing for follow up for evaluation of DOE.  DOE: PFTs are suggestive of asthma and this is likely a driver of symptoms.  Some of deconditioning given he admits to being less active.  Improved with ICS/LABA therapy but still present. Additionally, DLCO is severely reduced which is related to emphysema seen on CT but  also concern for contribution of pulmonary HTN given elevated PA pressures seen on TTE in 2016. Repeat TTE 10/2019 with mildly elevated PASP, RV enlargement but normal RV function and size.  Also developing element of deconditioning given his lack of activity in the preceding months.  Asked him to gradually increase his activity.  Escalated to Trelegy fall 2022 without improvement.  Subsequent improvement after resuming [employer] Winter 2022.  It is  possible that symptoms are related to worsening pulmonary hypertension.  Unfortunately, given his significant emphysematous changes there is no real good options for treatment of this.  Consider repeat TTE in the future.  However given relatively stable symptoms, no role for this at this time.  Asthma: Had improved with ICS/LABA.  Encouraged increased exercise. Back on Breo with adequate control of symptoms.   Return in about 6 months (around 03/21/2022).   Lanier Clam, MD 09/18/2021

## 2021-09-23 ENCOUNTER — Other Ambulatory Visit: Payer: Self-pay | Admitting: Internal Medicine

## 2021-09-23 NOTE — Telephone Encounter (Signed)
Please refill as per office routine med refill policy (all routine meds to be refilled for 3 mo or monthly (per pt preference) up to one year from last visit, then month to month grace period for 3 mo, then further med refills will have to be denied)

## 2021-09-25 ENCOUNTER — Ambulatory Visit (HOSPITAL_COMMUNITY)
Admission: EM | Admit: 2021-09-25 | Discharge: 2021-09-25 | Disposition: A | Discharge status: Home / Self Care | Payer: Medicare Other | Attending: Family Medicine | Admitting: Family Medicine

## 2021-09-25 ENCOUNTER — Encounter (HOSPITAL_COMMUNITY): Payer: Self-pay

## 2021-09-25 DIAGNOSIS — M109 Gout, unspecified: Secondary | ICD-10-CM | POA: Diagnosis not present

## 2021-09-25 MED ORDER — PREDNISONE 20 MG PO TABS: 40.0000 mg | ORAL_TABLET | Freq: Every day | ORAL | 0 refills | Status: DC

## 2021-09-25 NOTE — ED Provider Notes (Signed)
[place]   [id number] 09/25/21 Arrival Time: 2707  ASSESSMENT & PLAN:  1. Podagra    No signs of skin infection. No trauma. Unclear trigger.  Discharge Medication List as of 09/25/2021 10:51 AM     START taking these medications   Details  predniSONE (DELTASONE) 20 MG tablet Take 2 tablets (40 mg total) by mouth daily., Starting Thu 09/25/2021, Normal         Follow-up Information     [name], MD.   Specialties: Internal Medicine, Radiology Why: If worsening or failing to improve as anticipated. Contact information: [place] [address] [phone]                 Reviewed expectations re: course of current medical issues. Questions answered. Outlined signs and symptoms indicating need for more acute intervention. Understanding verbalized. After Visit Summary given.   SUBJECTIVE: History from: Patient. Nicholas Patton is a 87 y.o. male. Reports: possible gout. R great toe. Throbbing pain; x 2 days. Distant h/o gout. No tx PTA. Normal ambulation. No extremity sensation changes or weakness.   OBJECTIVE:  Vitals:   09/25/21 1003 09/25/21 1004  BP: (!) 148/68   Pulse: 80   Resp: 16   Temp: (!) 97.5 F (36.4 C)   TempSrc: Oral   SpO2: 90%   Weight:  86.2 kg  Height:  _0  (1.88 m)    General appearance: alert; no distress Skin: warm and dry; mild erythema and TTP over R MTP of great toe Neurologic: normal gait Psychological: alert and cooperative; normal mood and affect    Allergies  Allergen Reactions   Amlodipine Swelling   Cymbalta [Duloxetine Hcl] Other (See Comments)   Oxycodone Other (See Comments)    Past Medical History:  Diagnosis Date   Anemia    Arthritis    Cataract    REMOVED BILATERAL   Colon polyps    DIABETES MELLITUS, TYPE II    takes Metformin and Glimepiride daily   Diverticulosis    GERD (gastroesophageal reflux disease)    takes Protonix daily   History of colon polyps     HYPERLIPIDEMIA    takes Atorvastatin daily   HYPERTENSION    takes Amlodipine and Hyzaar daily   Ileus following gastrointestinal surgery (HCC) 07/22/2012   Joint pain    Peripheral neuropathy    Peripheral neuropathy    PERIPHERAL NEUROPATHY, FEET 10/06/2007   Renal disorder    VITAMIN B12 DEFICIENCY 09/06/2008   VITAMIN D DEFICIENCY 01/16/2010   takes Vitamin D daily   Social History   Socioeconomic History   Marital status: Married    Spouse name: Not on file   Number of children: Not on file   Years of education: Not on file   Highest education level: Not on file  Occupational History   Not on file  Tobacco Use   Smoking status: Former    Packs/day: 0.00    Types: Cigarettes    Passive exposure: Never   Smokeless tobacco: Never   Tobacco comments:    quit smoking around 1990  Vaping Use   Vaping Use: Never used  Substance and Sexual Activity   Alcohol use: No    Alcohol/week: 0.0 standard drinks of alcohol   Drug use: No   Sexual activity: Never  Other Topics Concern   Not on file  Social History Narrative   Not on file   Social Determinants of Health   Financial  Resource Strain: Not on file  Food Insecurity: Not on file  Transportation Needs: Not on file  Physical Activity: Not on file  Stress: Not on file  Social Connections: Not on file  Intimate Partner Violence: Not on file   Family History  Problem Relation Age of Onset   Diabetes Mellitus II Mother    Lung cancer Brother    Colon cancer Neg Hx    Esophageal cancer Neg Hx    Rectal cancer Neg Hx    Stomach cancer Neg Hx    Past Surgical History:  Procedure Laterality Date   CATARACT EXTRACTION     both eyes   CHOLECYSTECTOMY N/A 07/18/2012   Procedure: LAPAROSCOPIC CHOLECYSTECTOMY WITH INTRAOPERATIVE CHOLANGIOGRAM;  Surgeon: Zenovia Jarred, MD;  Location: [facility];  Service: General;  Laterality: N/A;   COLONOSCOPY     POLYPECTOMY     POSTERIOR CERVICAL FUSION/FORAMINOTOMY N/A 06/27/2015    Procedure: Posterior Cervical Fusion with lateral mass fixation Cervical four to cervical seven;  Surgeon: Eustace Moore, MD;  Location: [facility];  Service: Neurosurgery;  Laterality: N/A;   TOTAL KNEE ARTHROPLASTY  2001   Left   TURP Randal Buba, MD 09/25/21 [phone]

## 2021-09-25 NOTE — ED Triage Notes (Signed)
This is day 2 of gout flare up in the big toe of Patient's right foot.  Patient states it is a aching  and throbbing pain.

## 2021-10-13 ENCOUNTER — Ambulatory Visit: Payer: Self-pay | Admitting: Licensed Clinical Social Worker

## 2021-10-13 NOTE — Patient Outreach (Signed)
  Care Coordination   10/13/2021 Name: Nicholas Patton MRN: [id number] DOB: Oct 02, 1934   Care Coordination Outreach Attempts:  Contact was made with the patient today to offer care coordination services as a benefit of their health plan. The patient requested a return call on a later date.   Follow Up Plan:  appointment scheduled 10/14/2021  Encounter Outcome:  Pt. Scheduled  Care Coordination Interventions Activated:  No   Care Coordination Interventions:  No, not indicated    [name], [place] [phone]

## 2021-10-14 ENCOUNTER — Ambulatory Visit: Payer: Self-pay | Admitting: Licensed Clinical Social Worker

## 2021-10-14 DIAGNOSIS — R972 Elevated prostate specific antigen [PSA]: Secondary | ICD-10-CM | POA: Diagnosis not present

## 2021-10-14 NOTE — Patient Instructions (Signed)
Visit Information  Thank you for taking time to visit with me today. Please don't hesitate to contact me if I can be of assistance to you.   Following are the goals we discussed today:   Goals Addressed             This Visit's Progress    COMPLETED: Care Coordination Activities/ No follow up required       Care Coordination Interventions: Made referral to office to schedule AWV         Please call the care guide team at [phone] if you need to cancel or reschedule your appointment.   If you are experiencing a Mental Health or [place] or need someone to talk to, please call [phone]-TALK (toll free, 24 hour hotline)   Patient verbalizes understanding of instructions and care plan provided today and agrees to view in [place]. Active MyChart status and patient understanding of how to access instructions and care plan via MyChart confirmed with patient.     No further follow up required: [place], [place] [phone]

## 2021-10-14 NOTE — Patient Outreach (Signed)
  Care Coordination  Initial Visit Note   10/14/2021 Name: Nicholas Patton MRN: [id number] DOB: January 02, 1935  Nicholas Patton is a 87 y.o. year old male who sees [name], MD for primary care. I spoke with  Wonda Cheng by phone today  What matters to the patients health and wellness today?  "I am doing pretty good"  Patient reports no concerns or needs with health and wellness related to physical or mental heath. .   Recommendation: Patient may benefit from, and is in agreement to To get AWV scheduled.    Goals Addressed             This Visit's Progress    COMPLETED: Care Coordination Activities/ No follow up required       Care Coordination Interventions: Made referral to office to schedule AWV         SDOH assessments and interventions completed:  Yes  SDOH Interventions Today    Flowsheet Row Most Recent Value  SDOH Interventions   Food Insecurity Interventions Intervention Not Indicated  Financial Strain Interventions Intervention Not Indicated  Housing Interventions Intervention Not Indicated  Transportation Interventions Intervention Not Indicated      Care Coordination Interventions Activated:  Yes  Care Coordination Interventions:  Yes, provided   Follow up plan: No further intervention required.   Encounter Outcome:  Pt. Visit Completed   [name], [place] [phone]

## 2021-10-21 DIAGNOSIS — R350 Frequency of micturition: Secondary | ICD-10-CM | POA: Diagnosis not present

## 2021-10-21 DIAGNOSIS — R972 Elevated prostate specific antigen [PSA]: Secondary | ICD-10-CM | POA: Diagnosis not present

## 2021-10-21 DIAGNOSIS — R351 Nocturia: Secondary | ICD-10-CM | POA: Diagnosis not present

## 2021-10-21 DIAGNOSIS — R310 Gross hematuria: Secondary | ICD-10-CM | POA: Diagnosis not present

## 2021-10-21 DIAGNOSIS — N401 Enlarged prostate with lower urinary tract symptoms: Secondary | ICD-10-CM | POA: Diagnosis not present

## 2021-10-22 ENCOUNTER — Ambulatory Visit (INDEPENDENT_AMBULATORY_CARE_PROVIDER_SITE_OTHER): Payer: Medicare Other

## 2021-10-22 ENCOUNTER — Ambulatory Visit (INDEPENDENT_AMBULATORY_CARE_PROVIDER_SITE_OTHER): Payer: Medicare Other | Admitting: Internal Medicine

## 2021-10-22 VITALS — BP 124/70 | HR 59 | Temp 97.7°F | Ht 74.0 in | Wt 189.0 lb

## 2021-10-22 DIAGNOSIS — Z0000 Encounter for general adult medical examination without abnormal findings: Secondary | ICD-10-CM

## 2021-10-22 DIAGNOSIS — E538 Deficiency of other specified B group vitamins: Secondary | ICD-10-CM | POA: Diagnosis not present

## 2021-10-22 DIAGNOSIS — E7800 Pure hypercholesterolemia, unspecified: Secondary | ICD-10-CM

## 2021-10-22 DIAGNOSIS — E559 Vitamin D deficiency, unspecified: Secondary | ICD-10-CM | POA: Diagnosis not present

## 2021-10-22 DIAGNOSIS — I10 Essential (primary) hypertension: Secondary | ICD-10-CM

## 2021-10-22 DIAGNOSIS — E1140 Type 2 diabetes mellitus with diabetic neuropathy, unspecified: Secondary | ICD-10-CM | POA: Diagnosis not present

## 2021-10-22 DIAGNOSIS — N1832 Chronic kidney disease, stage 3b: Secondary | ICD-10-CM

## 2021-10-22 LAB — BASIC METABOLIC PANEL
BUN: 40 mg/dL — ABNORMAL HIGH (ref 6–23)
CO2: 27 mEq/L (ref 19–32)
Calcium: 9.9 mg/dL (ref 8.4–10.5)
Chloride: 106 mEq/L (ref 96–112)
Creatinine, Ser: 1.93 mg/dL — ABNORMAL HIGH (ref 0.40–1.50)
GFR: 31.23 mL/min — ABNORMAL LOW (ref >60.00)
Glucose, Bld: 113 mg/dL — ABNORMAL HIGH (ref 70–99)
Potassium: 3.8 mEq/L (ref 3.5–5.1)
Sodium: 143 mEq/L (ref 135–145)

## 2021-10-22 LAB — LIPID PANEL
Cholesterol: 153 mg/dL (ref 0–200)
HDL: 64.3 mg/dL (ref >39.00)
LDL Cholesterol: 75 mg/dL (ref 0–99)
NonHDL: 88.96
Total CHOL/HDL Ratio: 2
Triglycerides: 70 mg/dL (ref 0.0–149.0)
VLDL: 14 mg/dL (ref 0.0–40.0)

## 2021-10-22 LAB — VITAMIN D 25 HYDROXY (VIT D DEFICIENCY, FRACTURES): VITD: 31.49 ng/mL (ref 30.00–100.00)

## 2021-10-22 LAB — HEPATIC FUNCTION PANEL
ALT: 12 U/L (ref 0–53)
AST: 20 U/L (ref 0–37)
Albumin: 4.3 g/dL (ref 3.5–5.2)
Alkaline Phosphatase: 102 U/L (ref 39–117)
Bilirubin, Direct: 0.1 mg/dL (ref 0.0–0.3)
Total Bilirubin: 0.7 mg/dL (ref 0.2–1.2)
Total Protein: 7.1 g/dL (ref 6.0–8.3)

## 2021-10-22 LAB — HEMOGLOBIN A1C: Hgb A1c MFr Bld: 7.5 % — ABNORMAL HIGH (ref 4.6–6.5)

## 2021-10-22 LAB — VITAMIN B12: Vitamin B-12: 223 pg/mL (ref 211–911)

## 2021-10-22 MED ORDER — GLIMEPIRIDE 2 MG PO TABS: ORAL_TABLET | ORAL | 3 refills | Status: DC

## 2021-10-22 NOTE — Patient Instructions (Addendum)
Ok to decrease the glimeparide to 2 mg per day (1 pill only instead of 2 pills like before)  Please have your Shingrix (shingles) shots done at your local pharmacy.  Please continue all other medications as before, and refills have been done if requested.  Please have the pharmacy call with any other refills you may need.  Please continue your efforts at being more active, low cholesterol diet, and weight control.  You are otherwise up to date with prevention measures today.  Please keep your appointments with your specialists as you may have planned - kidney and oncology   Please go to the LAB at the blood drawing area for the tests to be done  You will be contacted by phone if any changes need to be made immediately.  Otherwise, you will receive a letter about your results with an explanation, but please check with MyChart first.  Please remember to sign up for MyChart if you have not done so, as this will be important to you in the future with finding out test results, communicating by private email, and scheduling acute appointments online when needed.  Please make an Appointment to return in 6 months, or sooner if needed

## 2021-10-22 NOTE — Assessment & Plan Note (Signed)
Lab Results  Component Value Date   HGBA1C 7.5 (H) 04/21/2021   Now overconrolled with several recent low sugars, pt to continue current medical treatment except to decrease the glimeparide to 2 mg qd (down from 4 mg), also for lab today

## 2021-10-22 NOTE — Progress Notes (Signed)
Subjective:   Nicholas FRAPPIER is a 87 y.o. male who presents for an Initial Medicare Annual Wellness Visit.  Review of Systems     Cardiac Risk Factors include: advanced age (>24mn, >>26women);diabetes mellitus;dyslipidemia;hypertension;male gender     Objective:    Today's Vitals   10/22/21 1059  BP: 124/70  Pulse: (!) 59  Temp: 97.7 F (36.5 C)  SpO2: 97%  Weight: 189 lb (85.7 kg)  Height: 6' 2" (1.88 m)  PainSc: 0-No pain   Body mass index is 24.27 kg/m.     10/22/2021   11:03 AM 09/08/2020    7:46 PM 05/18/2019    3:49 PM 03/06/2018    6:39 PM 02/16/2016    9:01 AM 01/08/2016   11:16 AM 06/19/2015    3:11 PM  Advanced Directives  Does Patient Have a Medical Advance Directive? Yes No Yes No No No No  Type of Advance Directive Living will;[place]care [place]      Does patient want to make changes to medical advance directive? No - Patient declined        Copy of [place]? No - copy requested        Would patient like information on creating a medical advance directive?  No - Patient declined     Yes - Educational materials given    Current Medications (verified) Outpatient Encounter Medications as of 10/22/2021  Medication Sig   albuterol (VENTOLIN HFA) 108 (90 Base) MCG/ACT inhaler INHALE 2 PUFFS BY MOUTH EVERY 6 HOURS AS NEEDED FOR WHEEZING FOR SHORTNESS OF BREATH   aspirin 81 MG EC tablet Take 81 mg by mouth daily.   atorvastatin (LIPITOR) 40 MG tablet TAKE 1 TABLET BY MOUTH  DAILY   BREO ELLIPTA 200-25 MCG/ACT AEPB USE 1 INHALATION BY MOUTH  DAILY   carvedilol (COREG) 12.5 MG tablet TAKE 1 TABLET BY MOUTH  TWICE DAILY WITH MEALS   doxazosin (CARDURA) 2 MG tablet Take 1 tablet (2 mg total) by mouth daily.   finasteride (PROSCAR) 5 MG tablet Take 5 mg by mouth daily.   furosemide (LASIX) 20 MG tablet Take 20 mg by mouth daily.   losartan (COZAAR) 100 MG tablet TAKE 1 TABLET BY MOUTH   DAILY   Multiple Vitamins-Minerals (MULTIVITAMIN PO) Take 1 tablet by mouth daily.   PAXIL 10 MG tablet Take 10 mg by mouth daily.   pioglitazone (ACTOS) 15 MG tablet TAKE 1 TABLET BY MOUTH DAILY   predniSONE (DELTASONE) 20 MG tablet Take 2 tablets (40 mg total) by mouth daily.   tamsulosin (FLOMAX) 0.4 MG CAPS capsule TAKE 1 CAPSULE BY MOUTH  DAILY   [DISCONTINUED] glimepiride (AMARYL) 2 MG tablet TAKE 2 TABLETS BY MOUTH  DAILY BEFORE BREAKFAST   No facility-administered encounter medications on file as of 10/22/2021.    Allergies (verified) Amlodipine, Cymbalta [duloxetine hcl], and Oxycodone   History: Past Medical History:  Diagnosis Date   Anemia    Arthritis    Cataract    REMOVED BILATERAL   Colon polyps    DIABETES MELLITUS, TYPE II    takes Metformin and Glimepiride daily   Diverticulosis    GERD (gastroesophageal reflux disease)    takes Protonix daily   History of colon polyps    HYPERLIPIDEMIA    takes Atorvastatin daily   HYPERTENSION    takes Amlodipine and Hyzaar daily   Ileus following gastrointestinal surgery [place] 07/22/2012  Joint pain    Peripheral neuropathy    Peripheral neuropathy    PERIPHERAL NEUROPATHY, FEET 10/06/2007   Renal disorder    VITAMIN B12 DEFICIENCY 09/06/2008   VITAMIN D DEFICIENCY 01/16/2010   takes Vitamin D daily   Past Surgical History:  Procedure Laterality Date   CATARACT EXTRACTION     both eyes   CHOLECYSTECTOMY N/A 07/18/2012   Procedure: LAPAROSCOPIC CHOLECYSTECTOMY WITH INTRAOPERATIVE CHOLANGIOGRAM;  Surgeon: Zenovia Jarred, MD;  Location: [facility];  Service: General;  Laterality: N/A;   COLONOSCOPY     POLYPECTOMY     POSTERIOR CERVICAL FUSION/FORAMINOTOMY N/A 06/27/2015   Procedure: Posterior Cervical Fusion with lateral mass fixation Cervical four to cervical seven;  Surgeon: Eustace Moore, MD;  Location: [facility];  Service: Neurosurgery;  Laterality: N/A;   TOTAL KNEE ARTHROPLASTY  2001   Left   TURP VAPORIZATION      Family History  Problem Relation Age of Onset   Diabetes Mellitus II Mother    Lung cancer Brother    Colon cancer Neg Hx    Esophageal cancer Neg Hx    Rectal cancer Neg Hx    Stomach cancer Neg Hx    Social History   Socioeconomic History   Marital status: Married    Spouse name: Not on file   Number of children: Not on file   Years of education: Not on file   Highest education level: Not on file  Occupational History   Not on file  Tobacco Use   Smoking status: Former    Packs/day: 0.00    Types: Cigarettes    Passive exposure: Never   Smokeless tobacco: Never   Tobacco comments:    quit smoking around 1990  Vaping Use   Vaping Use: Never used  Substance and Sexual Activity   Alcohol use: No    Alcohol/week: 0.0 standard drinks of alcohol   Drug use: No   Sexual activity: Never  Other Topics Concern   Not on file  Social History Narrative   Not on file   Social Determinants of Health   Financial Resource Strain: Medium Risk (10/22/2021)   Overall Financial Resource Strain (CARDIA)    Difficulty of Paying Living Expenses: Somewhat hard  Food Insecurity: No Food Insecurity (10/22/2021)   Hunger Vital Sign    Worried About Running Out of Food in the Last Year: Never true    Ran Out of Food in the Last Year: Never true  Transportation Needs: No Transportation Needs (10/14/2021)   PRAPARE - Hydrologist (Medical): No    Lack of Transportation (Non-Medical): No  Physical Activity: Sufficiently Active (10/22/2021)   Exercise Vital Sign    Days of Exercise per Week: 5 days    Minutes of Exercise per Session: 30 min  Stress: No Stress Concern Present (10/22/2021)   [place]    Feeling of Stress : Not at all  Social Connections: Moderately Integrated (10/22/2021)   Social Connection and Isolation Panel [NHANES]    Frequency of Communication with Friends and Family: More  than three times a week    Frequency of Social Gatherings with Friends and Family: More than three times a week    Attends Religious Services: More than 4 times per year    Active Member of [employer] or Organizations: Yes    Attends Archivist Meetings: More than 4 times per year  Marital Status: Widowed    Tobacco Counseling Counseling given: Not Answered Tobacco comments: quit smoking around 1990   Clinical Intake:  Pre-visit preparation completed: Yes  Pain : No/denies pain Pain Score: 0-No pain     BMI - recorded: 24.27 Nutritional Status: BMI of 19-24  Normal Nutritional Risks: None Diabetes: Yes CBG done?: No Did pt. bring in CBG monitor from home?: No  How often do you need to have someone help you when you read instructions, pamphlets, or other written materials from your doctor or pharmacy?: 1 - Never What is the last grade level you completed in school?: 8th grade  Diabetic? yes  Interpreter Needed?: No  Information entered by :: Lisette Abu, LPN.   Activities of Daily Living    10/22/2021   11:04 AM 11/18/2020   10:58 AM  In your present state of health, do you have any difficulty performing the following activities:  Hearing? 1 1  Vision? 0 0  Difficulty concentrating or making decisions? 0 0  Walking or climbing stairs? 0 1  Dressing or bathing? 0 0  Doing errands, shopping? 0 0  Preparing Food and eating ? N   Using the Toilet? N   In the past six months, have you accidently leaked urine? N   Do you have problems with loss of bowel control? N   Managing your Medications? N   Managing your Finances? N   Housekeeping or managing your Housekeeping? N     Patient Care Team: [name], MD as PCP - Evalina Field, MD as Referring Physician (Ophthalmology)  Indicate any recent Medical Services you may have received from other than [facility] providers in the past year (date may be approximate).     Assessment:   This is a  routine wellness examination for [place].  Hearing/Vision screen Hearing Screening - Comments:: Patient has hearing difficulty and wears hearing aids. Vision Screening - Comments:: Patient does wear corrective lenses/contacts.  Eye exam done by: Clent Jacks, MD.    Dietary issues and exercise activities discussed: Current Exercise Habits: Home exercise routine, Type of exercise: Other - see comments;walking (Stationary bike), Time (Minutes): 30, Frequency (Times/Week): 5, Weekly Exercise (Minutes/Week): 150, Intensity: Mild, Exercise limited by: respiratory conditions(s)   Goals Addressed             This Visit's Progress    To maintain my health and stay independent.        Depression Screen    10/22/2021   11:02 AM 10/22/2021   10:26 AM 04/21/2021    3:07 PM 01/29/2020    3:47 PM 04/06/2019   11:10 AM 04/06/2019   10:18 AM 11/09/2018   10:13 AM  PHQ 2/9 Scores  PHQ - 2 Score 0  0 4 0 0 0  PHQ- 9 Score    4     Exception Documentation  Patient refusal         Fall Risk    10/22/2021   10:26 AM 10/04/2020    1:11 PM 04/05/2020   10:46 AM 01/29/2020    3:46 PM 04/06/2019   11:10 AM  [place] in the past year? 0 0 0 0 0  Number falls in past yr:  0  0   Injury with Fall? 0 0     Risk for fall due to : No Fall Risks   No Fall Risks   Follow up Falls evaluation completed   Falls evaluation completed  FALL RISK PREVENTION PERTAINING TO THE HOME:  Any stairs in or around the home? No  If so, are there any without handrails? No  Home free of loose throw rugs in walkways, pet beds, electrical cords, etc? Yes  Adequate lighting in your home to reduce risk of falls? Yes   ASSISTIVE DEVICES UTILIZED TO PREVENT FALLS:  Life alert? No  Use of a cane, walker or w/c? No  Grab bars in the bathroom? Yes  Shower chair or bench in shower? No  Elevated toilet seat or a handicapped toilet? Yes   TIMED UP AND GO:  Was the test performed? Yes .  Length of time to  ambulate 10 feet: 6 sec.   Gait steady and fast without use of assistive device  Cognitive Function:    10/22/2021   11:05 AM  MMSE - Mini Mental State Exam  Orientation to time 5  Orientation to Place 5  Registration 3  Attention/ Calculation 5  Recall 3  Language- name 2 objects 2  Language- repeat 1  Language- follow 3 step command 3  Language- read & follow direction 1  Write a sentence 1  Copy design 1  Total score 30        Immunizations Immunization History  Administered Date(s) Administered   Fluad Quad(high Dose 65+) 11/09/2018, 11/30/2019, 12/14/2020   Influenza Whole 11/21/2015   Influenza, High Dose Seasonal PF 01/24/2013, 12/01/2016, 11/22/2017   Influenza,inj,Quad PF,6+ Mos 12/08/2013   Influenza-Unspecified 02/08/2015   PFIZER(Purple Top)SARS-COV-2 Vaccination 05/03/2019, 05/04/2019, 12/23/2019, 10/10/2020   Pneumococcal Conjugate-13 03/22/2013   Pneumococcal Polysaccharide-23 01/10/2016   Td 10/05/2014   Zoster Recombinat (Shingrix) 07/25/2021    TDAP status: Up to date  Flu Vaccine status: Up to date  Pneumococcal vaccine status: Up to date  Covid-19 vaccine status: Completed vaccines  Qualifies for Shingles Vaccine? Yes   Zostavax completed No   Shingrix Completed?: Yes  Screening Tests Health Maintenance  Topic Date Due   COVID-19 Vaccine (5 - Pfizer risk series) 11/07/2021 (Originally 12/05/2020)   Zoster Vaccines- Shingrix (2 of 2) 01/22/2022 (Originally 09/19/2021)   INFLUENZA VACCINE  06/07/2022 (Originally 10/07/2021)   Diabetic kidney evaluation - GFR measurement  04/21/2022   Diabetic kidney evaluation - Urine ACR  04/21/2022   FOOT EXAM  04/21/2022   OPHTHALMOLOGY EXAM  05/21/2022   TETANUS/TDAP  10/04/2024   Pneumonia Vaccine 40+ Years old  Completed   HPV VACCINES  Aged Out    Health Maintenance  There are no preventive care reminders to display for this patient.   Colorectal cancer screening: No longer required.   Lung  Cancer Screening: (Low Dose CT Chest recommended if Age 85-80 years, 30 pack-year currently smoking OR have quit w/in 15years.) does not qualify.   Lung Cancer Screening Referral: no  Additional Screening:  Hepatitis C Screening: does not qualify; Completed no  Vision Screening: Recommended annual ophthalmology exams for early detection of glaucoma and other disorders of the eye. Is the patient up to date with their annual eye exam?  yes Who is the provider or what is the name of the office in which the patient attends annual eye exams? Clent Jacks, MD. If pt is not established with a provider, would they like to be referred to a provider to establish care? No .   Dental Screening: Recommended annual dental exams for proper oral hygiene  Community Resource Referral / Chronic Care Management: CRR required this visit?  No   CCM required this visit?  No      Plan:     I have personally reviewed and noted the following in the patient's chart:   Medical and social history Use of alcohol, tobacco or illicit drugs  Current medications and supplements including opioid prescriptions. Patient is not currently taking opioid prescriptions. Functional ability and status Nutritional status Physical activity Advanced directives List of other physicians Hospitalizations, surgeries, and ER visits in previous 12 months Vitals Screenings to include cognitive, depression, and falls Referrals and appointments  In addition, I have reviewed and discussed with patient certain preventive protocols, quality metrics, and best practice recommendations. A written personalized care plan for preventive services as well as general preventive health recommendations were provided to patient.     Sheral Flow, LPN   [id number]   Nurse Notes:  Hearing Screening - Comments:: Patient has hearing difficulty and wears hearing aids. Vision Screening - Comments:: Patient does wear corrective  lenses/contacts.  Eye exam done by: Clent Jacks, MD.

## 2021-10-22 NOTE — Progress Notes (Signed)
Patient ID: ISAC LINCKS, male   DOB: [birth year], 87 y.o.   MRN: [id number]        Chief Complaint: follow up HTN, HLD and DM       HPI:  Nicholas Patton is a 87 y.o. male here overall doing ok, but has had 1-2 mo gradually worsening frequency low blood sugars less than 90 , now about 2-3 times per wk, no falls or complications, just feels weak and better with eating.  Has not yet tried to cut back on his meds.  Has lost several lbs recently.  MGUS stable per pt, sees heme yearly.  Also Sees renal every 6 mo. Pt denies chest pain, increased sob or doe, wheezing, orthopnea, PND, increased LE swelling, palpitations, dizziness or syncope.   Pt denies polydipsia, polyuria, or new focal neuro s/s.    Pt denies fever, wt loss, night sweats, loss of appetite, or other constitutional symptoms  No other new complaints  Wt Readings from Last 3 Encounters:  10/22/21 189 lb (85.7 kg)  10/22/21 189 lb (85.7 kg)  09/25/21 190 lb (86.2 kg)   BP Readings from Last 3 Encounters:  10/22/21 124/70  10/22/21 124/70  09/25/21 (!) 148/68         Past Medical History:  Diagnosis Date   Anemia    Arthritis    Cataract    REMOVED BILATERAL   Colon polyps    DIABETES MELLITUS, TYPE II    takes Metformin and Glimepiride daily   Diverticulosis    GERD (gastroesophageal reflux disease)    takes Protonix daily   History of colon polyps    HYPERLIPIDEMIA    takes Atorvastatin daily   HYPERTENSION    takes Amlodipine and Hyzaar daily   Ileus following gastrointestinal surgery ([place]) 07/22/2012   Joint pain    Peripheral neuropathy    Peripheral neuropathy    PERIPHERAL NEUROPATHY, FEET 10/06/2007   Renal disorder    VITAMIN B12 DEFICIENCY 09/06/2008   VITAMIN D DEFICIENCY 01/16/2010   takes Vitamin D daily   Past Surgical History:  Procedure Laterality Date   CATARACT EXTRACTION     both eyes   CHOLECYSTECTOMY N/A 07/18/2012   Procedure: LAPAROSCOPIC CHOLECYSTECTOMY WITH INTRAOPERATIVE CHOLANGIOGRAM;   Surgeon: Zenovia Jarred, MD;  Location: [facility];  Service: General;  Laterality: N/A;   COLONOSCOPY     POLYPECTOMY     POSTERIOR CERVICAL FUSION/FORAMINOTOMY N/A 06/27/2015   Procedure: Posterior Cervical Fusion with lateral mass fixation Cervical four to cervical seven;  Surgeon: Eustace Moore, MD;  Location: [facility];  Service: Neurosurgery;  Laterality: N/A;   TOTAL KNEE ARTHROPLASTY  2001   Left   TURP VAPORIZATION      reports that he has quit smoking. His smoking use included cigarettes. He has never been exposed to tobacco smoke. He has never used smokeless tobacco. He reports that he does not drink alcohol and does not use drugs. family history includes Diabetes Mellitus II in his mother; Lung cancer in his brother. Allergies  Allergen Reactions   Amlodipine Swelling   Cymbalta [Duloxetine Hcl] Other (See Comments)   Oxycodone Other (See Comments)   Current Outpatient Medications on File Prior to Visit  Medication Sig Dispense Refill   albuterol (VENTOLIN HFA) 108 (90 Base) MCG/ACT inhaler INHALE 2 PUFFS BY MOUTH EVERY 6 HOURS AS NEEDED FOR WHEEZING FOR SHORTNESS OF BREATH 18 g 3   aspirin 81 MG EC tablet Take 81 mg by mouth  daily.     atorvastatin (LIPITOR) 40 MG tablet TAKE 1 TABLET BY MOUTH  DAILY 90 tablet 3   BREO ELLIPTA 200-25 MCG/ACT AEPB USE 1 INHALATION BY MOUTH  DAILY 180 each 3   carvedilol (COREG) 12.5 MG tablet TAKE 1 TABLET BY MOUTH  TWICE DAILY WITH MEALS 180 tablet 3   doxazosin (CARDURA) 2 MG tablet Take 1 tablet (2 mg total) by mouth daily. 90 tablet 3   finasteride (PROSCAR) 5 MG tablet Take 5 mg by mouth daily.     furosemide (LASIX) 20 MG tablet Take 20 mg by mouth daily.     losartan (COZAAR) 100 MG tablet TAKE 1 TABLET BY MOUTH  DAILY 90 tablet 3   Multiple Vitamins-Minerals (MULTIVITAMIN PO) Take 1 tablet by mouth daily.     PAXIL 10 MG tablet Take 10 mg by mouth daily.     pioglitazone (ACTOS) 15 MG tablet TAKE 1 TABLET BY MOUTH DAILY 90 tablet 2    predniSONE (DELTASONE) 20 MG tablet Take 2 tablets (40 mg total) by mouth daily. 10 tablet 0   tamsulosin (FLOMAX) 0.4 MG CAPS capsule TAKE 1 CAPSULE BY MOUTH  DAILY 90 capsule 3   No current facility-administered medications on file prior to visit.        ROS:  All others reviewed and negative.  Objective        PE:  BP 124/70 (BP Location: Left Arm, Patient Position: Sitting, Cuff Size: Large)   Pulse (!) 59   Temp 97.7 F (36.5 C) (Oral)   Ht _0  (1.88 m)   Wt 189 lb (85.7 kg)   SpO2 97%   BMI 24.27 kg/m                 Constitutional: Pt appears in NAD               HENT: Head: NCAT.                Right Ear: External ear normal.                 Left Ear: External ear normal.                Eyes: . Pupils are equal, round, and reactive to light. Conjunctivae and EOM are normal               Nose: without d/c or deformity               Neck: Neck supple. Gross normal ROM               Cardiovascular: Normal rate and regular rhythm.                 Pulmonary/Chest: Effort normal and breath sounds without rales or wheezing.                Abd:  Soft, NT, ND, + BS, no organomegaly               Neurological: Pt is alert. At baseline orientation, motor grossly intact               Skin: Skin is warm. No rashes, no other new lesions, LE edema - none               Psychiatric: Pt behavior is normal without agitation   Micro: none  Cardiac tracings I have personally interpreted today:  none  Pertinent Radiological findings (summarize): none  Lab Results  Component Value Date   WBC 4.1 04/21/2021   HGB 13.6 04/21/2021   HCT 42.4 04/21/2021   PLT 152.0 04/21/2021   GLUCOSE 113 (H) 10/22/2021   CHOL 153 10/22/2021   TRIG 70.0 10/22/2021   HDL 64.30 10/22/2021   LDLCALC 75 10/22/2021   ALT 12 10/22/2021   AST 20 10/22/2021   NA 143 10/22/2021   K 3.8 10/22/2021   CL 106 10/22/2021   CREATININE 1.93 (H) 10/22/2021   BUN 40 (H) 10/22/2021   CO2 27 10/22/2021   TSH  6.40 (H) 04/21/2021   PSA 4.61 (H) 04/09/2017   INR 1.12 06/19/2015   HGBA1C 7.5 (H) 10/22/2021   MICROALBUR 1.1 04/21/2021   Assessment/Plan:  Nicholas Patton is a 87 y.o. Black or African American [2] male with  has a past medical history of Anemia, Arthritis, Cataract, Colon polyps, DIABETES MELLITUS, TYPE II, Diverticulosis, GERD (gastroesophageal reflux disease), History of colon polyps, HYPERLIPIDEMIA, HYPERTENSION, Ileus following gastrointestinal surgery ([place]) (07/22/2012), Joint pain, Peripheral neuropathy, Peripheral neuropathy, PERIPHERAL NEUROPATHY, FEET (10/06/2007), Renal disorder, VITAMIN B12 DEFICIENCY (09/06/2008), and VITAMIN D DEFICIENCY (01/16/2010).  Vitamin D deficiency . Last vitamin D Lab Results  Component Value Date   VD25OH 40.46 04/21/2021   Stable, cont oral replacement   B12 deficiency Lab Results  Component Value Date   VITAMINB12 374 01/14/2021   Stable, cont oral replacement - b12 1000 mcg qd   Diabetes Lab Results  Component Value Date   HGBA1C 7.5 (H) 04/21/2021   Now overconrolled with several recent low sugars, pt to continue current medical treatment except to decrease the glimeparide to 2 mg qd (down from 4 mg), also for lab today   CKD (chronic kidney disease) stage 3, GFR 30-59 ml/min Lab Results  Component Value Date   CREATININE 1.93 (H) 10/22/2021   Stable overall, cont to avoid nephrotoxins   Essential hypertension BP Readings from Last 3 Encounters:  10/22/21 124/70  10/22/21 124/70  09/25/21 (!) 148/68   Stable, pt to continue medical treatment coreg 12.5 bid    Hyperlipidemia Lab Results  Component Value Date   [place] 75 10/22/2021   Uncontrolled, goal ldl < 70, pt to continue current statin lipitor 40 mg as declines change for now  Followup: No follow-ups on file.  Cathlean Cower, MD 10/25/2021 6:20 PM [facility]

## 2021-10-22 NOTE — Patient Instructions (Signed)
Nicholas Patton , Thank you for taking time to come for your Medicare Wellness Visit. I appreciate your ongoing commitment to your health goals. Please review the following plan we discussed and let me know if I can assist you in the future.   Screening recommendations/referrals: Colonoscopy: Discontinued due to age Recommended yearly ophthalmology/optometry visit for glaucoma screening and checkup Recommended yearly dental visit for hygiene and checkup  Vaccinations: Influenza vaccine: 12/14/2020 Pneumococcal vaccine: 03/22/2013, 01/10/2016 Tdap vaccine: 10/04/2013; due every 10 years Shingles vaccine: 07/25/2021   Covid-19: 04/13/2019, 05/04/2019, 12/23/2019, 10/10/2020  Advanced directives: Yes; daughter is aware of wishes  Conditions/risks identified: Yes  Next appointment: Please schedule your next Medicare Wellness Visit with your Nurse Health Advisor in 1 year by calling [phone].  Preventive Care 85 Years and Older, Male Preventive care refers to lifestyle choices and visits with your health care provider that can promote health and wellness. What does preventive care include? A yearly physical exam. This is also called an annual well check. Dental exams once or twice a year. Routine eye exams. Ask your health care provider how often you should have your eyes checked. Personal lifestyle choices, including: Daily care of your teeth and gums. Regular physical activity. Eating a healthy diet. Avoiding tobacco and drug use. Limiting alcohol use. Practicing safe sex. Taking low doses of aspirin every day. Taking vitamin and mineral supplements as recommended by your health care provider. What happens during an annual well check? The services and screenings done by your health care provider during your annual well check will depend on your age, overall health, lifestyle risk factors, and family history of disease. Counseling  Your health care provider may ask you questions about  your: Alcohol use. Tobacco use. Drug use. Emotional well-being. Home and relationship well-being. Sexual activity. Eating habits. History of falls. Memory and ability to understand (cognition). Work and work Statistician. Screening  You may have the following tests or measurements: Height, weight, and BMI. Blood pressure. Lipid and cholesterol levels. These may be checked every 5 years, or more frequently if you are over 72 years old. Skin check. Lung cancer screening. You may have this screening every year starting at age 85 if you have a 30-pack-year history of smoking and currently smoke or have quit within the past 15 years. Fecal occult blood test (FOBT) of the stool. You may have this test every year starting at age 85 Flexible sigmoidoscopy or colonoscopy. You may have a sigmoidoscopy every 5 years or a colonoscopy every 10 years starting at age 65. Prostate cancer screening. Recommendations will vary depending on your family history and other risks. Hepatitis C blood test. Hepatitis B blood test. Sexually transmitted disease (STD) testing. Diabetes screening. This is done by checking your blood sugar (glucose) after you have not eaten for a while (fasting). You may have this done every 1-3 years. Abdominal aortic aneurysm (AAA) screening. You may need this if you are a current or former smoker. Osteoporosis. You may be screened starting at age 85 if you are at high risk. Talk with your health care provider about your test results, treatment options, and if necessary, the need for more tests. Vaccines  Your health care provider may recommend certain vaccines, such as: Influenza vaccine. This is recommended every year. Tetanus, diphtheria, and acellular pertussis (Tdap, Td) vaccine. You may need a Td booster every 10 years. Zoster vaccine. You may need this after age 85. Pneumococcal 13-valent conjugate (PCV13) vaccine. One dose is recommended after age 85.  Pneumococcal  polysaccharide (PPSV23) vaccine. One dose is recommended after age 85. Talk to your health care provider about which screenings and vaccines you need and how often you need them. This information is not intended to replace advice given to you by your health care provider. Make sure you discuss any questions you have with your health care provider. Document Released: 03/22/2015 Document Revised: 11/13/2015 Document Reviewed: 12/25/2014 Elsevier Interactive Patient Education  2017 [employer] Prevention in the Home Falls can cause injuries. They can happen to people of all ages. There are many things you can do to make your home safe and to help prevent falls. What can I do on the outside of my home? Regularly fix the edges of walkways and driveways and fix any cracks. Remove anything that might make you trip as you walk through a door, such as a raised step or threshold. Trim any bushes or trees on the path to your home. Use bright outdoor lighting. Clear any walking paths of anything that might make someone trip, such as rocks or tools. Regularly check to see if handrails are loose or broken. Make sure that both sides of any steps have handrails. Any raised decks and porches should have guardrails on the edges. Have any leaves, snow, or ice cleared regularly. Use sand or salt on walking paths during winter. Clean up any spills in your garage right away. This includes oil or grease spills. What can I do in the bathroom? Use night lights. Install grab bars by the toilet and in the tub and shower. Do not use towel bars as grab bars. Use non-skid mats or decals in the tub or shower. If you need to sit down in the shower, use a plastic, non-slip stool. Keep the floor dry. Clean up any water that spills on the floor as soon as it happens. Remove soap buildup in the tub or shower regularly. Attach bath mats securely with double-sided non-slip rug tape. Do not have throw rugs and other  things on the floor that can make you trip. What can I do in the bedroom? Use night lights. Make sure that you have a light by your bed that is easy to reach. Do not use any sheets or blankets that are too big for your bed. They should not hang down onto the floor. Have a firm chair that has side arms. You can use this for support while you get dressed. Do not have throw rugs and other things on the floor that can make you trip. What can I do in the kitchen? Clean up any spills right away. Avoid walking on wet floors. Keep items that you use a lot in easy-to-reach places. If you need to reach something above you, use a strong step stool that has a grab bar. Keep electrical cords out of the way. Do not use floor polish or wax that makes floors slippery. If you must use wax, use non-skid floor wax. Do not have throw rugs and other things on the floor that can make you trip. What can I do with my stairs? Do not leave any items on the stairs. Make sure that there are handrails on both sides of the stairs and use them. Fix handrails that are broken or loose. Make sure that handrails are as long as the stairways. Check any carpeting to make sure that it is firmly attached to the stairs. Fix any carpet that is loose or worn. Avoid having throw rugs at the  top or bottom of the stairs. If you do have throw rugs, attach them to the floor with carpet tape. Make sure that you have a light switch at the top of the stairs and the bottom of the stairs. If you do not have them, ask someone to add them for you. What else can I do to help prevent falls? Wear shoes that: Do not have high heels. Have rubber bottoms. Are comfortable and fit you well. Are closed at the toe. Do not wear sandals. If you use a stepladder: Make sure that it is fully opened. Do not climb a closed stepladder. Make sure that both sides of the stepladder are locked into place. Ask someone to hold it for you, if possible. Clearly  mark and make sure that you can see: Any grab bars or handrails. First and last steps. Where the edge of each step is. Use tools that help you move around (mobility aids) if they are needed. These include: Canes. Walkers. Scooters. Crutches. Turn on the lights when you go into a dark area. Replace any light bulbs as soon as they burn out. Set up your furniture so you have a clear path. Avoid moving your furniture around. If any of your floors are uneven, fix them. If there are any pets around you, be aware of where they are. Review your medicines with your doctor. Some medicines can make you feel dizzy. This can increase your chance of falling. Ask your doctor what other things that you can do to help prevent falls. This information is not intended to replace advice given to you by your health care provider. Make sure you discuss any questions you have with your health care provider. Document Released: 12/20/2008 Document Revised: 08/01/2015 Document Reviewed: 03/30/2014 Elsevier Interactive Patient Education  2017 [employer].

## 2021-10-22 NOTE — Assessment & Plan Note (Signed)
.   Last vitamin D Lab Results  Component Value Date   VD25OH 40.46 04/21/2021   Stable, cont oral replacement

## 2021-10-22 NOTE — Assessment & Plan Note (Signed)
Lab Results  Component Value Date   ASTMHDQQ22 979 01/14/2021   Stable, cont oral replacement - b12 1000 mcg qd

## 2021-10-25 NOTE — Assessment & Plan Note (Signed)
Lab Results  Component Value Date   CREATININE 1.93 (H) 10/22/2021   Stable overall, cont to avoid nephrotoxins

## 2021-10-25 NOTE — Assessment & Plan Note (Signed)
BP Readings from Last 3 Encounters:  10/22/21 124/70  10/22/21 124/70  09/25/21 (!) 148/68   Stable, pt to continue medical treatment coreg 12.5 bid

## 2021-10-25 NOTE — Assessment & Plan Note (Signed)
Lab Results  Component Value Date   LDLCALC 75 10/22/2021   Uncontrolled, goal ldl < 70, pt to continue current statin lipitor 40 mg as declines change for now

## 2021-10-30 ENCOUNTER — Inpatient Hospital Stay: Payer: Medicare Other | Attending: Hematology and Oncology

## 2021-10-30 ENCOUNTER — Other Ambulatory Visit: Payer: Self-pay

## 2021-10-30 DIAGNOSIS — D472 Monoclonal gammopathy: Secondary | ICD-10-CM | POA: Insufficient documentation

## 2021-10-30 LAB — CMP (CANCER CENTER ONLY)
ALT: 12 U/L (ref 0–44)
AST: 18 U/L (ref 15–41)
Albumin: 4.2 g/dL (ref 3.5–5.0)
Alkaline Phosphatase: 110 U/L (ref 38–126)
Anion gap: 6 (ref 5–15)
BUN: 37 mg/dL — ABNORMAL HIGH (ref 8–23)
CO2: 27 mmol/L (ref 22–32)
Calcium: 9.8 mg/dL (ref 8.9–10.3)
Chloride: 109 mmol/L (ref 98–111)
Creatinine: 1.86 mg/dL — ABNORMAL HIGH (ref 0.61–1.24)
GFR, Estimated: 35 mL/min — ABNORMAL LOW (ref >60)
Glucose, Bld: 209 mg/dL — ABNORMAL HIGH (ref 70–99)
Potassium: 3.6 mmol/L (ref 3.5–5.1)
Sodium: 142 mmol/L (ref 135–145)
Total Bilirubin: 0.6 mg/dL (ref 0.3–1.2)
Total Protein: 6.8 g/dL (ref 6.5–8.1)

## 2021-10-30 LAB — CBC WITH DIFFERENTIAL (CANCER CENTER ONLY)
Abs Immature Granulocytes: 0.01 10*3/uL (ref 0.00–0.07)
Basophils Absolute: 0 10*3/uL (ref 0.0–0.1)
Basophils Relative: 1 %
Eosinophils Absolute: 0.1 10*3/uL (ref 0.0–0.5)
Eosinophils Relative: 3 %
HCT: 39.1 % (ref 39.0–52.0)
Hemoglobin: 13 g/dL (ref 13.0–17.0)
Immature Granulocytes: 0 %
Lymphocytes Relative: 29 %
Lymphs Abs: 0.9 10*3/uL (ref 0.7–4.0)
MCH: 30.2 pg (ref 26.0–34.0)
MCHC: 33.2 g/dL (ref 30.0–36.0)
MCV: 90.9 fL (ref 80.0–100.0)
Monocytes Absolute: 0.3 10*3/uL (ref 0.1–1.0)
Monocytes Relative: 10 %
Neutro Abs: 1.7 10*3/uL (ref 1.7–7.7)
Neutrophils Relative %: 57 %
Platelet Count: 146 10*3/uL — ABNORMAL LOW (ref 150–400)
RBC: 4.3 MIL/uL (ref 4.22–5.81)
RDW: 14.3 % (ref 11.5–15.5)
WBC Count: 3.1 10*3/uL — ABNORMAL LOW (ref 4.0–10.5)
nRBC: 0 % (ref 0.0–0.2)

## 2021-10-31 LAB — KAPPA/LAMBDA LIGHT CHAINS
Kappa free light chain: 2082.5 mg/L — ABNORMAL HIGH (ref 3.3–19.4)
Kappa, lambda light chain ratio: 152.01 — ABNORMAL HIGH (ref 0.26–1.65)
Lambda free light chains: 13.7 mg/L (ref 5.7–26.3)

## 2021-10-31 LAB — BETA 2 MICROGLOBULIN, SERUM: Beta-2 Microglobulin: 3.7 mg/L — ABNORMAL HIGH (ref 0.6–2.4)

## 2021-11-04 LAB — MULTIPLE MYELOMA PANEL, SERUM
Albumin SerPl Elph-Mcnc: 3.6 g/dL (ref 2.9–4.4)
Albumin/Glob SerPl: 1.4 (ref 0.7–1.7)
Alpha 1: 0.2 g/dL (ref 0.0–0.4)
Alpha2 Glob SerPl Elph-Mcnc: 0.7 g/dL (ref 0.4–1.0)
B-Globulin SerPl Elph-Mcnc: 0.9 g/dL (ref 0.7–1.3)
Gamma Glob SerPl Elph-Mcnc: 0.7 g/dL (ref 0.4–1.8)
Globulin, Total: 2.6 g/dL (ref 2.2–3.9)
IgA: 278 mg/dL (ref 61–437)
IgG (Immunoglobin G), Serum: 575 mg/dL — ABNORMAL LOW (ref 603–1613)
IgM (Immunoglobulin M), Srm: 26 mg/dL (ref 15–143)
M Protein SerPl Elph-Mcnc: 0.2 g/dL — ABNORMAL HIGH
Total Protein ELP: 6.2 g/dL (ref 6.0–8.5)

## 2021-11-05 NOTE — Progress Notes (Signed)
Patient Care Team: [name], MD as PCP - Evalina Field, MD as Referring Physician (Ophthalmology)  DIAGNOSIS: No diagnosis found.  SUMMARY OF ONCOLOGIC HISTORY: Oncology History   No history exists.    CHIEF COMPLIANT:  Follow-up of MGUS  INTERVAL HISTORY: Nicholas Patton is a 87 y.o. with above-mentioned history of  MGUS. He presents to the clinic today for a follow-up.    ALLERGIES:  is allergic to amlodipine, cymbalta [duloxetine hcl], and oxycodone.  MEDICATIONS:  Current Outpatient Medications  Medication Sig Dispense Refill   albuterol (VENTOLIN HFA) 108 (90 Base) MCG/ACT inhaler INHALE 2 PUFFS BY MOUTH EVERY 6 HOURS AS NEEDED FOR WHEEZING FOR SHORTNESS OF BREATH 18 g 3   aspirin 81 MG EC tablet Take 81 mg by mouth daily.     atorvastatin (LIPITOR) 40 MG tablet TAKE 1 TABLET BY MOUTH  DAILY 90 tablet 3   BREO ELLIPTA 200-25 MCG/ACT AEPB USE 1 INHALATION BY MOUTH  DAILY 180 each 3   carvedilol (COREG) 12.5 MG tablet TAKE 1 TABLET BY MOUTH  TWICE DAILY WITH MEALS 180 tablet 3   doxazosin (CARDURA) 2 MG tablet Take 1 tablet (2 mg total) by mouth daily. 90 tablet 3   finasteride (PROSCAR) 5 MG tablet Take 5 mg by mouth daily.     furosemide (LASIX) 20 MG tablet Take 20 mg by mouth daily.     glimepiride (AMARYL) 2 MG tablet 1 tab by mouth once daily 90 tablet 3   losartan (COZAAR) 100 MG tablet TAKE 1 TABLET BY MOUTH  DAILY 90 tablet 3   Multiple Vitamins-Minerals (MULTIVITAMIN PO) Take 1 tablet by mouth daily.     PAXIL 10 MG tablet Take 10 mg by mouth daily.     pioglitazone (ACTOS) 15 MG tablet TAKE 1 TABLET BY MOUTH DAILY 90 tablet 2   predniSONE (DELTASONE) 20 MG tablet Take 2 tablets (40 mg total) by mouth daily. 10 tablet 0   tamsulosin (FLOMAX) 0.4 MG CAPS capsule TAKE 1 CAPSULE BY MOUTH  DAILY 90 capsule 3   No current facility-administered medications for this visit.    PHYSICAL EXAMINATION: ECOG PERFORMANCE STATUS: {CHL ONC ECOG  [id number]}  There were no vitals filed for this visit. There were no vitals filed for this visit.  BREAST:*** No palpable masses or nodules in either right or left breasts. No palpable axillary supraclavicular or infraclavicular adenopathy no breast tenderness or nipple discharge. (exam performed in the presence of a chaperone)  LABORATORY DATA:  I have reviewed the data as listed    Latest Ref Rng & Units 10/30/2021   10:07 AM 10/22/2021   11:18 AM 04/21/2021    3:51 PM  CMP  Glucose 70 - 99 mg/dL 209  113  116   BUN 8 - 23 mg/dL 37  40  47   Creatinine 0.61 - 1.24 mg/dL 1.86  1.93  2.29   Sodium 135 - 145 mmol/L 142  143  143   Potassium 3.5 - 5.1 mmol/L 3.6  3.8  4.1   Chloride 98 - 111 mmol/L 109  106  108   CO2 22 - 32 mmol/L _0 Calcium 8.9 - 10.3 mg/dL 9.8  9.9  10.3   Total Protein 6.5 - 8.1 g/dL 6.8  7.1  7.1   Total Bilirubin 0.3 - 1.2 mg/dL 0.6  0.7  0.5   Alkaline Phos 38 - 126 U/L 110  102  104  AST 15 - 41 U/L _0 ALT 0 - 44 U/L _1 Lab Results  Component Value Date   WBC 3.1 (L) 10/30/2021   HGB 13.0 10/30/2021   HCT 39.1 10/30/2021   MCV 90.9 10/30/2021   PLT 146 (L) 10/30/2021   NEUTROABS 1.7 10/30/2021    ASSESSMENT & PLAN:  No problem-specific Assessment & Plan notes found for this encounter.    No orders of the defined types were placed in this encounter.  The patient has a good understanding of the overall plan. he agrees with it. he will call with any problems that may develop before the next visit here. Total time spent: 30 mins including face to face time and time spent for planning, charting and co-ordination of care   Suzzette Righter, [place] 11/05/21    I Gardiner Coins am scribing for Dr. Lindi Adie  ***

## 2021-11-06 ENCOUNTER — Inpatient Hospital Stay (HOSPITAL_BASED_OUTPATIENT_CLINIC_OR_DEPARTMENT_OTHER): Payer: Medicare Other | Admitting: Hematology and Oncology

## 2021-11-06 ENCOUNTER — Other Ambulatory Visit: Payer: Self-pay

## 2021-11-06 VITALS — BP 154/65 | HR 68 | Temp 97.3°F | Resp 18 | Ht 74.0 in

## 2021-11-06 DIAGNOSIS — D472 Monoclonal gammopathy: Secondary | ICD-10-CM | POA: Diagnosis not present

## 2021-11-06 NOTE — Assessment & Plan Note (Addendum)
Blood work review  09/20/2017: M protein 0.2 g(IgA kappa by immunofixation)Quantitative immunoglobulins: IgG 569, IgA 219, IgM 28 Bone survey 11/09/2017: Benign 10/25/2018:M protein 0.2 g (same as before) IgA kappa,Serum free kappa: 1409, KL ratio 136.8 (these labs were not done previously) 10/25/2019: M protein 0.2 g IgA kappa, kappa light chain: 1749, ratio 101, creatinine 1.76, beta-2 microglobulin: 3 10/23/2020: M protein: 0.3 g, Kappa 1697, lambda 13.1, ratio 129.54, hemoglobin 12.9, creatinine 2.06, beta-2 microglobulin 3.7 10/30/2021: M protein 0.2 g, Kappa 2082, ratio 152, creatinine 1.86, albumin 4.2, hemoglobin 13, beta-2 microglobulin 3.7  The M protein has not significantly increased and he does not have any endorgan disease. Therefore we decided to watch and monitor. The change in the serum creatinine is related to furosemide. The kappa light chain is extremely high and therefore I discussed with him about obtaining a bone survey.  That is normal we do not have to do a bone marrow biopsy.  Return to clinic in 1 year with labs done 1 week ahead of time and follow

## 2021-11-11 ENCOUNTER — Telehealth: Payer: Self-pay | Admitting: Hematology and Oncology

## 2021-11-11 NOTE — Telephone Encounter (Signed)
Scheduled appointment per 8/31 los. Left voicemail.

## 2021-11-17 ENCOUNTER — Ambulatory Visit (HOSPITAL_COMMUNITY)
Admission: RE | Admit: 2021-11-17 | Discharge: 2021-11-17 | Disposition: A | Discharge status: Home / Self Care | Payer: Medicare Other | Source: Ambulatory Visit | Attending: Hematology and Oncology | Admitting: Hematology and Oncology

## 2021-11-17 DIAGNOSIS — D472 Monoclonal gammopathy: Secondary | ICD-10-CM | POA: Insufficient documentation

## 2021-11-19 ENCOUNTER — Inpatient Hospital Stay: Payer: Medicare Other | Attending: Hematology and Oncology | Admitting: Hematology and Oncology

## 2021-11-19 DIAGNOSIS — D472 Monoclonal gammopathy: Secondary | ICD-10-CM | POA: Diagnosis not present

## 2021-11-19 DIAGNOSIS — M79621 Pain in right upper arm: Secondary | ICD-10-CM | POA: Diagnosis not present

## 2021-11-19 NOTE — Progress Notes (Signed)
HEMATOLOGY-ONCOLOGY TELEPHONE VISIT PROGRESS NOTE  I connected with our patient on 11/19/21 at  1:30 PM EDT by telephone and verified that I am speaking with the correct person using two identifiers.  I discussed the limitations, risks, security and privacy concerns of performing an evaluation and management service by telephone and the availability of in person appointments.  I also discussed with the patient that there may be a patient responsible charge related to this service. The patient expressed understanding and agreed to proceed.   History of Present Illness: Telephone visit follow-up to discuss the results of recently concluded bone survey.  His right humerus has an uptake with lytic lesions concerning for myeloma.  The recommendation is for [place] to obtain an MRI of the shoulder.  He reports to be complaining of pain and discomfort in that area.  Although his pain is mostly on the opposite side.    REVIEW OF SYSTEMS:   Constitutional: Denies fevers, chills or abnormal weight loss All other systems were reviewed with the patient and are negative. Observations/Objective:     Assessment Plan:  MGUS (monoclonal gammopathy of unknown significance) Blood work review  09/20/2017: M protein 0.2 g (IgA kappa by immunofixation)Quantitative immunoglobulins: IgG 569, IgA 219, IgM 28 Bone survey 11/09/2017: Benign 10/25/2018: M protein 0.2 g (same as before) IgA kappa, Serum free kappa: 1409, KL ratio 136.8 (these labs were not done previously) 10/25/2019: M protein 0.2 g IgA kappa, kappa light chain: 1749, ratio 101, creatinine 1.76, beta-2 microglobulin: 3 10/23/2020: M protein: 0.3 g, Kappa 1697, lambda 13.1, ratio 129.54, hemoglobin 12.9, creatinine 2.06, beta-2 microglobulin 3.7 10/30/2021: M protein 0.2 g, Kappa 2082, ratio 152, creatinine 1.86, albumin 4.2, hemoglobin 13, beta-2 microglobulin 3.7  11/18/2021: Bone Survey:  Questionable new lucencies noted in the proximal right humeral  diaphysis  Plan: Obtain MRI humerus for further evaluation Telephone visit after to discuss   I discussed the assessment and treatment plan with the patient. The patient was provided an opportunity to ask questions and all were answered. The patient agreed with the plan and demonstrated an understanding of the instructions. The patient was advised to call back or seek an in-person evaluation if the symptoms worsen or if the condition fails to improve as anticipated.   I provided 12 minutes of non-face-to-face time during this encounter.  This includes time for charting and coordination of care   Harriette Ohara, MD

## 2021-11-19 NOTE — Assessment & Plan Note (Signed)
Blood work review  09/20/2017: M protein 0.2 g(IgA kappa by immunofixation)Quantitative immunoglobulins: IgG 569, IgA 219, IgM 28 Bone survey 11/09/2017: Benign 10/25/2018:M protein 0.2 g (same as before) IgA kappa,Serum free kappa: 1409, KL ratio 136.8 (these labs were not done previously) 10/25/2019: M protein 0.2 g IgA kappa, kappa light chain: 1749, ratio 101, creatinine 1.76, beta-2 microglobulin: 3 10/23/2020: M protein: 0.3 g, Kappa 1697, lambda 13.1, ratio 129.54, hemoglobin 12.9, creatinine 2.06, beta-2 microglobulin 3.7 10/30/2021: M protein 0.2 g, Kappa 2082, ratio 152, creatinine 1.86, albumin 4.2, hemoglobin 13, beta-2 microglobulin 3.7  11/18/2021: Bone Survey: Questionable new lucencies noted in the proximal right humeral diaphysis  Plan: Obtain MRI humerus for further evaluation Telephone visit after to discuss

## 2021-11-20 ENCOUNTER — Telehealth: Payer: Self-pay | Admitting: Hematology and Oncology

## 2021-11-20 NOTE — Telephone Encounter (Signed)
Contacted patient to scheduled appointments. Left message with appointment details and a call back number if patient had any questions or could not accommodate the time we provided.

## 2021-11-21 ENCOUNTER — Telehealth: Payer: Self-pay

## 2021-11-21 ENCOUNTER — Encounter: Payer: Self-pay | Admitting: Podiatry

## 2021-11-21 ENCOUNTER — Ambulatory Visit (INDEPENDENT_AMBULATORY_CARE_PROVIDER_SITE_OTHER): Payer: Medicare Other | Admitting: Podiatry

## 2021-11-21 DIAGNOSIS — M79674 Pain in right toe(s): Secondary | ICD-10-CM

## 2021-11-21 DIAGNOSIS — M79675 Pain in left toe(s): Secondary | ICD-10-CM

## 2021-11-21 DIAGNOSIS — L84 Corns and callosities: Secondary | ICD-10-CM

## 2021-11-21 DIAGNOSIS — B351 Tinea unguium: Secondary | ICD-10-CM

## 2021-11-21 DIAGNOSIS — E1142 Type 2 diabetes mellitus with diabetic polyneuropathy: Secondary | ICD-10-CM | POA: Diagnosis not present

## 2021-11-21 NOTE — Telephone Encounter (Signed)
Pt called and states he was scheduled for MRI 9/29 but MD visit is 9/22. Per MD order, expected date of MRI is 9/20. Contacted [facility] to change appt. He will go 9/20 at 1230 for MRI. Pt is aware and verbalized thanks.

## 2021-11-26 ENCOUNTER — Ambulatory Visit
Admission: RE | Admit: 2021-11-26 | Discharge: 2021-11-26 | Disposition: A | Discharge status: Home / Self Care | Payer: Medicare Other | Source: Ambulatory Visit | Attending: Hematology and Oncology | Admitting: Hematology and Oncology

## 2021-11-26 DIAGNOSIS — M79621 Pain in right upper arm: Secondary | ICD-10-CM

## 2021-11-26 DIAGNOSIS — M19011 Primary osteoarthritis, right shoulder: Secondary | ICD-10-CM | POA: Diagnosis not present

## 2021-11-26 DIAGNOSIS — M25411 Effusion, right shoulder: Secondary | ICD-10-CM | POA: Diagnosis not present

## 2021-11-26 MED ORDER — GADOBENATE DIMEGLUMINE 529 MG/ML IV SOLN
17.0000 mL | Freq: Once | INTRAVENOUS | Status: AC | PRN
  Administered 2021-11-26: 17 mL via INTRAVENOUS

## 2021-11-26 NOTE — Progress Notes (Signed)
  Subjective:  Patient ID: Nicholas Patton, male    DOB: [birth year],  MRN: [id number]  [name] presents to clinic today for at risk foot care with history of diabetic neuropathy and corn(s) left lower extremity, callus(es) right lower extremity and painful mycotic nails.  Pain interferes with ambulation. Aggravating factors include wearing enclosed shoe gear. Painful toenails interfere with ambulation. Aggravating factors include wearing enclosed shoe gear. Pain is relieved with periodic professional debridement. Painful corns and calluses are aggravated when weightbearing with and without shoegear. Pain is relieved with periodic professional debridement.  Last known  HgA1c was 7.5%.  Patient did not check blood glucose this morning.  New problem(s): None.   PCP is [name], MD , and last visit was  October 22, 2021.  Allergies  Allergen Reactions   Amlodipine Swelling   Cymbalta [Duloxetine Hcl] Other (See Comments)   Oxycodone Other (See Comments)    Review of Systems: Negative except as noted in the HPI.  Objective: No changes noted in today's physical examination. [name] is a pleasant 87 y.o. male in NAD. AAO x 3.  Neurovascular Examination: Capillary refill time to digits immediate b/l. Palpable DP pulse(s) b/l LE. Palpable PT pulse(s) b/l LE. Pedal hair absent. Nonpitting edema noted right lower extremity.  Pt has subjective symptoms of neuropathy. Protective sensation intact 5/5 intact bilaterally with 10g monofilament b/l. Vibratory sensation intact b/l.  Dermatological:  Pedal skin is warm and supple b/l LE. No open wounds b/l LE. No interdigital macerations noted b/l LE. Toenails 1-5 b/l elongated, discolored, dystrophic, thickened, crumbly with subungual debris and tenderness to dorsal palpation.   Hyperkeratotic lesion(s) lateral IPJ of L hallux, medial PIPJ of L 2nd toe, and 1st metatarsal head right foot. No erythema, no edema, no drainage, no  fluctuance.  Musculoskeletal:  Muscle strength 5/5 to all lower extremity muscle groups bilaterally. No pain, crepitus or joint limitation noted with ROM bilateral LE. HAV with bunion deformity noted b/l LE. Hammertoe deformity noted 2-5 b/l.  Assessment/Plan: 1. Pain due to onychomycosis of toenails of both feet   2. Corns and callosities   3. Diabetic peripheral neuropathy associated with type 2 diabetes mellitus ([place])   -Examined patient. -Continue foot and shoe inspections daily. Monitor blood glucose per PCP/Endocrinologist's recommendations. -Toenails 1-5 b/l were debrided in length and girth with sterile nail nippers and dremel without iatrogenic bleeding.  -Corn(s) left great toe and L 2nd toe and callus(es) right lower extremity were pared utilizing sterile scalpel blade without incident. Total number debrided =3. -Continue padding to afftected digits daily for protection. -Patient/POA to call should there be question/concern in the interim.   Return in about 3 months (around 02/20/2022).  Marzetta Board, DPM

## 2021-11-27 ENCOUNTER — Encounter: Payer: Self-pay | Admitting: Pulmonary Disease

## 2021-11-27 NOTE — Progress Notes (Signed)
HEMATOLOGY-ONCOLOGY TELEPHONE VISIT PROGRESS NOTE  I connected with our patient on 11/28/21 at 12:15 PM EDT by telephone and verified that I am speaking with the correct person using two identifiers.  I discussed the limitations, risks, security and privacy concerns of performing an evaluation and management service by telephone and the availability of in person appointments.  I also discussed with the patient that there may be a patient responsible charge related to this service. The patient expressed understanding and agreed to proceed.   History of Present Illness:  Nicholas Patton is 87 Year-old- with Mgus. He presents to the clinic today via phone follow-up to discuss Mri humerus for further evaluation.     REVIEW OF SYSTEMS:   Constitutional: Denies fevers, chills or abnormal weight loss All other systems were reviewed with the patient and are negative. Observations/Objective:     Assessment Plan:  MGUS (monoclonal gammopathy of unknown significance) Blood work review  09/20/2017: M protein 0.2 g (IgA kappa by immunofixation)Quantitative immunoglobulins: IgG 569, IgA 219, IgM 28 Bone survey 11/09/2017: Benign 10/25/2018: M protein 0.2 g (same as before) IgA kappa, Serum free kappa: 1409, KL ratio 136.8 (these labs were not done previously) 10/25/2019: M protein 0.2 g IgA kappa, kappa light chain: 1749, ratio 101, creatinine 1.76, beta-2 microglobulin: 3 10/23/2020: M protein: 0.3 g, Kappa 1697, lambda 13.1, ratio 129.54, hemoglobin 12.9, creatinine 2.06, beta-2 microglobulin 3.7 10/30/2021: M protein 0.2 g, Kappa 2082, ratio 152, creatinine 1.86, albumin 4.2, hemoglobin 13, beta-2 microglobulin 3.7  11/18/2021: Bone Survey:  Questionable new lucencies noted in the proximal right humeral diaphysis    MRI right humerus 11/26/2021:No suspicious or acute osseous findings within the proximal right humerus.  Probable full-thickness tear of the supraspinatus tendon  I disc within the results of the MRI.  He  does not complain of any pain or discomfort or difficulty with elevation of the arm.  Therefore we decided not to do anything further with the supraspinatus tendon tear.  Return to clinic in 1 year with labs Follow-up 1 week later to discuss results    I discussed the assessment and treatment plan with the patient. The patient was provided an opportunity to ask questions and all were answered. The patient agreed with the plan and demonstrated an understanding of the instructions. The patient was advised to call back or seek an in-person evaluation if the symptoms worsen or if the condition fails to improve as anticipated.   I provided 12 minutes of non-face-to-face time during this encounter.  This includes time for charting and coordination of care   Harriette Ohara, MD  I Gardiner Coins am scribing for Dr. Lindi Adie  I have reviewed the above documentation for accuracy and completeness, and I agree with the above.

## 2021-11-28 ENCOUNTER — Inpatient Hospital Stay (HOSPITAL_BASED_OUTPATIENT_CLINIC_OR_DEPARTMENT_OTHER): Payer: Medicare Other | Admitting: Hematology and Oncology

## 2021-11-28 DIAGNOSIS — D472 Monoclonal gammopathy: Secondary | ICD-10-CM | POA: Diagnosis not present

## 2021-11-28 MED ORDER — PREDNISONE 20 MG PO TABS: 40.0000 mg | ORAL_TABLET | Freq: Every day | ORAL | 0 refills | Status: DC

## 2021-11-28 NOTE — Telephone Encounter (Signed)
Dr. Silas Flood, please advise on pt's message regarding his cough and ShOB. Thanks!

## 2021-11-28 NOTE — Telephone Encounter (Signed)
Recommend a covid test. Please send prednisone 40 mg daily for 5 days for presumed asthma exacerbation. If not improving over the weekend please seek emergency care. Can we schedule appt with APP for next week?

## 2021-11-28 NOTE — Assessment & Plan Note (Signed)
Blood work review  09/20/2017: M protein 0.2 g(IgA kappa by immunofixation)Quantitative immunoglobulins: IgG 569, IgA 219, IgM 28 Bone survey 11/09/2017: Benign 10/25/2018:M protein 0.2 g (same as before) IgA kappa,Serum free kappa: 1409, KL ratio 136.8 (these labs were not done previously) 10/25/2019: M protein 0.2 g IgA kappa, kappa light chain: 1749, ratio 101, creatinine 1.76, beta-2 microglobulin: 3 10/23/2020: M protein: 0.3 g, Kappa 1697, lambda 13.1, ratio 129.54, hemoglobin 12.9, creatinine 2.06, beta-2 microglobulin 3.7 10/30/2021: M protein 0.2 g, Kappa 2082, ratio 152, creatinine 1.86, albumin 4.2, hemoglobin 13, beta-2 microglobulin 3.7  11/18/2021: Bone Survey: Questionable new lucencies noted in the proximal right humeral diaphysis   MRI right humerus 11/26/2021:

## 2021-12-02 ENCOUNTER — Encounter: Payer: Self-pay | Admitting: Adult Health

## 2021-12-02 ENCOUNTER — Ambulatory Visit (INDEPENDENT_AMBULATORY_CARE_PROVIDER_SITE_OTHER): Payer: Medicare Other | Admitting: Adult Health

## 2021-12-02 ENCOUNTER — Ambulatory Visit (INDEPENDENT_AMBULATORY_CARE_PROVIDER_SITE_OTHER): Payer: Medicare Other

## 2021-12-02 VITALS — BP 140/60 | HR 66 | Temp 97.8°F | Ht 74.0 in | Wt 193.2 lb

## 2021-12-02 DIAGNOSIS — J440 Chronic obstructive pulmonary disease with acute lower respiratory infection: Secondary | ICD-10-CM

## 2021-12-02 DIAGNOSIS — J45909 Unspecified asthma, uncomplicated: Secondary | ICD-10-CM | POA: Diagnosis not present

## 2021-12-02 DIAGNOSIS — J209 Acute bronchitis, unspecified: Secondary | ICD-10-CM

## 2021-12-02 DIAGNOSIS — J439 Emphysema, unspecified: Secondary | ICD-10-CM | POA: Diagnosis not present

## 2021-12-02 MED ORDER — AZITHROMYCIN 250 MG PO TABS: ORAL_TABLET | ORAL | 0 refills | Status: AC

## 2021-12-02 NOTE — Progress Notes (Signed)
_0  ID: Nicholas Patton, male    DOB: Jan 25, 1935, 87 y.o.   MRN: [id number]  Chief Complaint  Patient presents with   Follow-up    Referring provider: [name], MD  HPI: 87 year old male former smoker followed for asthma and emphysema   TEST/EVENTS :   12/02/2021 Follow up : Asthma/Emphysema  Patient presents for follow-up visit.  Patient complains that about a week ago he started feeling poorly with increased cough, congestion, postnasal drainage, hoarseness, general malaise.  He was called in prednisone 40 mg x 5 days.  Patient states he is on day 3 of 5.  Patient says he is starting to feel better but continues to have thick mucus that is difficult to get up at times.  He remains on Breo daily.  He denies any hemoptysis, chest pain, orthopnea.  No fever.  Appetite is good with no nausea vomiting or diarrhea.  Patient did take to COVID-19 home test that were both negative.    Allergies  Allergen Reactions   Amlodipine Swelling   Cymbalta [Duloxetine Hcl] Other (See Comments)   Oxycodone Other (See Comments)    Immunization History  Administered Date(s) Administered   Fluad Quad(high Dose 65+) 11/09/2018, 11/30/2019, 12/14/2020   Influenza Whole 11/21/2015   Influenza, High Dose Seasonal PF 01/24/2013, 12/01/2016, 11/22/2017   Influenza,inj,Quad PF,6+ Mos 12/08/2013   Influenza-Unspecified 02/08/2015   PFIZER(Purple Top)SARS-COV-2 Vaccination 05/03/2019, 05/04/2019, 12/23/2019, 10/10/2020   Pneumococcal Conjugate-13 03/22/2013   Pneumococcal Polysaccharide-23 01/10/2016   Td 10/05/2014   Zoster Recombinat (Shingrix) 07/25/2021    Past Medical History:  Diagnosis Date   Anemia    Arthritis    Cataract    REMOVED BILATERAL   Colon polyps    DIABETES MELLITUS, TYPE II    takes Metformin and Glimepiride daily   Diverticulosis    GERD (gastroesophageal reflux disease)    takes Protonix daily   History of colon polyps    HYPERLIPIDEMIA    takes  Atorvastatin daily   HYPERTENSION    takes Amlodipine and Hyzaar daily   Ileus following gastrointestinal surgery ([place]) 07/22/2012   Joint pain    Peripheral neuropathy    Peripheral neuropathy    PERIPHERAL NEUROPATHY, FEET 10/06/2007   Renal disorder    VITAMIN B12 DEFICIENCY 09/06/2008   VITAMIN D DEFICIENCY 01/16/2010   takes Vitamin D daily    Tobacco History: Social History   Tobacco Use  Smoking Status Former   Packs/day: 0.00   Types: Cigarettes   Passive exposure: Never  Smokeless Tobacco Never  Tobacco Comments   quit smoking around 1990   Counseling given: Not Answered Tobacco comments: quit smoking around 1990   Outpatient Medications Prior to Visit  Medication Sig Dispense Refill   albuterol (VENTOLIN HFA) 108 (90 Base) MCG/ACT inhaler INHALE 2 PUFFS BY MOUTH EVERY 6 HOURS AS NEEDED FOR WHEEZING FOR SHORTNESS OF BREATH 18 g 3   aspirin 81 MG EC tablet Take 81 mg by mouth daily.     atorvastatin (LIPITOR) 40 MG tablet TAKE 1 TABLET BY MOUTH  DAILY 90 tablet 3   BREO ELLIPTA 200-25 MCG/ACT AEPB USE 1 INHALATION BY MOUTH  DAILY 180 each 3   carvedilol (COREG) 12.5 MG tablet TAKE 1 TABLET BY MOUTH  TWICE DAILY WITH MEALS 180 tablet 3   doxazosin (CARDURA) 2 MG tablet Take 1 tablet (2 mg total) by mouth daily. 90 tablet 3   finasteride (PROSCAR) 5 MG tablet Take 5 mg by mouth  daily.     furosemide (LASIX) 20 MG tablet Take 20 mg by mouth daily.     glimepiride (AMARYL) 2 MG tablet 1 tab by mouth once daily 90 tablet 3   losartan (COZAAR) 100 MG tablet TAKE 1 TABLET BY MOUTH  DAILY 90 tablet 3   Multiple Vitamins-Minerals (MULTIVITAMIN PO) Take 1 tablet by mouth daily.     pantoprazole (PROTONIX) 40 MG tablet Take 40 mg by mouth daily.     PAXIL 10 MG tablet Take 10 mg by mouth daily.     pioglitazone (ACTOS) 15 MG tablet TAKE 1 TABLET BY MOUTH DAILY 90 tablet 2   predniSONE (DELTASONE) 20 MG tablet Take 2 tablets (40 mg total) by mouth daily with breakfast. 10 tablet  0   tamsulosin (FLOMAX) 0.4 MG CAPS capsule TAKE 1 CAPSULE BY MOUTH  DAILY 90 capsule 3   predniSONE (DELTASONE) 20 MG tablet Take 2 tablets (40 mg total) by mouth daily. (Patient not taking: Reported on 12/02/2021) 10 tablet 0   No facility-administered medications prior to visit.     Review of Systems:   Constitutional:   No  weight loss, night sweats,  Fevers, chills,  +fatigue, or  lassitude.  HEENT:   No headaches,  Difficulty swallowing,  Tooth/dental problems, or  Sore throat,                No sneezing, itching, ear ache,  +nasal congestion, post nasal drip,   CV:  No chest pain,  Orthopnea, PND, swelling in lower extremities, anasarca, dizziness, palpitations, syncope.   GI  No heartburn, indigestion, abdominal pain, nausea, vomiting, diarrhea, change in bowel habits, loss of appetite, bloody stools.   Resp:  .  No chest wall deformity  Skin: no rash or lesions.  GU: no dysuria, change in color of urine, no urgency or frequency.  No flank pain, no hematuria   MS:  No joint pain or swelling.  No decreased range of motion.  No back pain.    Physical Exam  BP (!) 140/60 (BP Location: Left Arm, Patient Position: Sitting, Cuff Size: Normal)   Pulse 66   Temp 97.8 F (36.6 C) (Oral)   Ht _0  (1.88 m)   Wt 193 lb 3.2 oz (87.6 kg)   SpO2 93%   BMI 24.81 kg/m   GEN: A/Ox3; pleasant , NAD, elderly    HEENT:  [place]/AT,  NOSE-clear, THROAT-clear, no lesions, no postnasal drip or exudate noted.   NECK:  Supple w/ fair ROM; no JVD; normal carotid impulses w/o bruits; no thyromegaly or nodules palpated; no lymphadenopathy.    RESP few scattered rhonchi no accessory muscle use, no dullness to percussion  CARD:  RRR, no m/r/g, no peripheral edema, pulses intact, no cyanosis or clubbing.  GI:   Soft & nt; nml bowel sounds; no organomegaly or masses detected.   Musco: Warm bil, no deformities or joint swelling noted.   Neuro: alert, no focal deficits noted.    Skin:  Warm, no lesions or rashes    Lab Results:    BNP   ProBNP No results found for: "PROBNP"       Latest Ref Rng & Units 10/04/2019   10:53 AM  PFT Results  FVC-Pre L 3.21  P  FVC-Predicted Pre % 78  P  FVC-Post L 3.67  P  FVC-Predicted Post % 90  P  Pre FEV1/FVC % % 68  P  Post FEV1/FCV % % 69  P  FEV1-Pre L 2.18  P  FEV1-Predicted Pre % 73  P  FEV1-Post L 2.55  P  DLCO uncorrected ml/min/mmHg 12.86  P  DLCO UNC% % 49  P  DLCO corrected ml/min/mmHg 12.94  P  DLCO COR %Predicted % 49  P  DLVA Predicted % 50  P  TLC L 7.34  P  TLC % Predicted % 95  P  RV % Predicted % 120  P    P Preliminary result    No results found for: "NITRICOXIDE"      Assessment & Plan:   Acute asthmatic bronchitis Acute asthmatic bronchitis-patient has some clinical improvement on prednisone.  Continues to have ongoing lingering cough and congestion.  We will treat with empiric antibiotics have him finish up his prednisone burst.  Continue on ICS/LABA combo inhaler. No nebulizer treatment given in office.  Chest x-ray today.  Plan  Patient Instructions  Zpack take as directed  Mucinex DM Twice daily As needed cough/congestion  Finish Prednisone as directed  Albuterol inhaler As needed   Continue on BREO 1 puff daily, rinse after use.  Chest xray today  Follow up with Dr. Silas Flood in 3 months and As needed   Please contact office for sooner follow up if symptoms do not improve or worsen or seek emergency care         Rexene Edison, NP 12/02/2021

## 2021-12-02 NOTE — Assessment & Plan Note (Signed)
Acute asthmatic bronchitis-patient has some clinical improvement on prednisone.  Continues to have ongoing lingering cough and congestion.  We will treat with empiric antibiotics have him finish up his prednisone burst.  Continue on ICS/LABA combo inhaler. No nebulizer treatment given in office.  Chest x-ray today.  Plan  Patient Instructions  Zpack take as directed  Mucinex DM Twice daily As needed cough/congestion  Finish Prednisone as directed  Albuterol inhaler As needed   Continue on BREO 1 puff daily, rinse after use.  Chest xray today  Follow up with Dr. Silas Flood in 3 months and As needed   Please contact office for sooner follow up if symptoms do not improve or worsen or seek emergency care

## 2021-12-02 NOTE — Patient Instructions (Signed)
Zpack take as directed  Mucinex DM Twice daily As needed cough/congestion  Finish Prednisone as directed  Albuterol inhaler As needed   Continue on BREO 1 puff daily, rinse after use.  Chest xray today  Follow up with Dr. Silas Flood in 3 months and As needed   Please contact office for sooner follow up if symptoms do not improve or worsen or seek emergency care

## 2021-12-05 ENCOUNTER — Other Ambulatory Visit: Payer: Medicare Other

## 2021-12-05 MED ORDER — AZITHROMYCIN 250 MG PO TABS: 250.0000 mg | ORAL_TABLET | Freq: Every day | ORAL | 0 refills | Status: DC

## 2021-12-05 NOTE — Progress Notes (Unsigned)
Error

## 2021-12-08 ENCOUNTER — Other Ambulatory Visit: Payer: Self-pay | Admitting: *Deleted

## 2021-12-08 DIAGNOSIS — L298 Other pruritus: Secondary | ICD-10-CM | POA: Diagnosis not present

## 2021-12-08 DIAGNOSIS — J45909 Unspecified asthma, uncomplicated: Secondary | ICD-10-CM

## 2021-12-08 NOTE — Progress Notes (Signed)
Has a f/u OV on 12/22/21.

## 2021-12-22 ENCOUNTER — Ambulatory Visit (INDEPENDENT_AMBULATORY_CARE_PROVIDER_SITE_OTHER): Payer: Medicare Other

## 2021-12-22 ENCOUNTER — Ambulatory Visit (INDEPENDENT_AMBULATORY_CARE_PROVIDER_SITE_OTHER): Payer: Medicare Other | Admitting: Adult Health

## 2021-12-22 ENCOUNTER — Encounter: Payer: Self-pay | Admitting: Adult Health

## 2021-12-22 DIAGNOSIS — J45909 Unspecified asthma, uncomplicated: Secondary | ICD-10-CM

## 2021-12-22 DIAGNOSIS — J439 Emphysema, unspecified: Secondary | ICD-10-CM | POA: Diagnosis not present

## 2021-12-22 DIAGNOSIS — J449 Chronic obstructive pulmonary disease, unspecified: Secondary | ICD-10-CM | POA: Diagnosis not present

## 2021-12-22 NOTE — Progress Notes (Signed)
_0  ID: Nicholas Patton, male    DOB: 11/07/1934, 87 y.o.   MRN: [id number]  Chief Complaint  Patient presents with   Follow-up    Referring provider: [name], MD  HPI: 87 year old male former smoker followed for asthma and emphysema  TEST/EVENTS :   12/22/2021 Follow up : Asthma and Emphysema  Patient presents for a 3-week follow-up.  He was seen for an acute office visit for an asthmatic bronchitic exacerbation plus or minus pneumonia.  Chest x-ray showed increased opacities in the mid and lower lungs. He was treated with a 10-day course of azithromycin and a prednisone taper.  Since last visit patient is feeling better.  He has decreased cough and congestion.  He denies any hemoptysis, chest pain, orthopnea or diarrhea.  Chest x-ray today shows decreased opacities in the bilateral lungs. He remains on Breo daily.  He denies any hemoptysis, chest pain, orthopnea.  Allergies  Allergen Reactions   Amlodipine Swelling   Cymbalta [Duloxetine Hcl] Other (See Comments)   Oxycodone Other (See Comments)    Immunization History  Administered Date(s) Administered   Fluad Quad(high Dose 65+) 11/09/2018, 11/30/2019, 12/14/2020   Influenza Whole 11/21/2015   Influenza, High Dose Seasonal PF 01/24/2013, 12/01/2016, 11/22/2017   Influenza,inj,Quad PF,6+ Mos 12/08/2013   Influenza-Unspecified 02/08/2015   PFIZER(Purple Top)SARS-COV-2 Vaccination 05/03/2019, 05/04/2019, 12/23/2019, 10/10/2020   Pneumococcal Conjugate-13 03/22/2013   Pneumococcal Polysaccharide-23 01/10/2016   Td 10/05/2014   Zoster Recombinat (Shingrix) 07/25/2021    Past Medical History:  Diagnosis Date   Anemia    Arthritis    Cataract    REMOVED BILATERAL   Colon polyps    DIABETES MELLITUS, TYPE II    takes Metformin and Glimepiride daily   Diverticulosis    GERD (gastroesophageal reflux disease)    takes Protonix daily   History of colon polyps    HYPERLIPIDEMIA    takes Atorvastatin daily    HYPERTENSION    takes Amlodipine and Hyzaar daily   Ileus following gastrointestinal surgery ([facility]) 07/22/2012   Joint pain    Peripheral neuropathy    Peripheral neuropathy    PERIPHERAL NEUROPATHY, FEET 10/06/2007   Renal disorder    VITAMIN B12 DEFICIENCY 09/06/2008   VITAMIN D DEFICIENCY 01/16/2010   takes Vitamin D daily    Tobacco History: Social History   Tobacco Use  Smoking Status Former   Packs/day: 1.00   Years: 31.00   Total pack years: 31.00   Types: Cigarettes   Quit date: 12/23/1983   Years since quitting: 38.0   Passive exposure: Never  Smokeless Tobacco Never   Counseling given: Not Answered   Outpatient Medications Prior to Visit  Medication Sig Dispense Refill   albuterol (VENTOLIN HFA) 108 (90 Base) MCG/ACT inhaler INHALE 2 PUFFS BY MOUTH EVERY 6 HOURS AS NEEDED FOR WHEEZING FOR SHORTNESS OF BREATH 18 g 3   aspirin 81 MG EC tablet Take 81 mg by mouth daily.     atorvastatin (LIPITOR) 40 MG tablet TAKE 1 TABLET BY MOUTH  DAILY 90 tablet 3   azithromycin (ZITHROMAX) 250 MG tablet Take 1 tablet (250 mg total) by mouth daily. 5 tablet 0   BREO ELLIPTA 200-25 MCG/ACT AEPB USE 1 INHALATION BY MOUTH  DAILY 180 each 3   carvedilol (COREG) 12.5 MG tablet TAKE 1 TABLET BY MOUTH  TWICE DAILY WITH MEALS 180 tablet 3   doxazosin (CARDURA) 2 MG tablet Take 1 tablet (2 mg total) by mouth daily. 90 tablet  3   finasteride (PROSCAR) 5 MG tablet Take 5 mg by mouth daily.     furosemide (LASIX) 20 MG tablet Take 20 mg by mouth daily.     glimepiride (AMARYL) 2 MG tablet 1 tab by mouth once daily 90 tablet 3   losartan (COZAAR) 100 MG tablet TAKE 1 TABLET BY MOUTH  DAILY 90 tablet 3   Multiple Vitamins-Minerals (MULTIVITAMIN PO) Take 1 tablet by mouth daily.     pantoprazole (PROTONIX) 40 MG tablet Take 40 mg by mouth daily.     PAXIL 10 MG tablet Take 10 mg by mouth daily.     pioglitazone (ACTOS) 15 MG tablet TAKE 1 TABLET BY MOUTH DAILY 90 tablet 2   tamsulosin (FLOMAX)  0.4 MG CAPS capsule TAKE 1 CAPSULE BY MOUTH  DAILY 90 capsule 3   predniSONE (DELTASONE) 20 MG tablet Take 2 tablets (40 mg total) by mouth daily. (Patient not taking: Reported on 12/22/2021) 10 tablet 0   predniSONE (DELTASONE) 20 MG tablet Take 2 tablets (40 mg total) by mouth daily with breakfast. (Patient not taking: Reported on 12/22/2021) 10 tablet 0   No facility-administered medications prior to visit.     Review of Systems:   Constitutional:   No  weight loss, night sweats,  Fevers, chills, fatigue, or  lassitude.  HEENT:   No headaches,  Difficulty swallowing,  Tooth/dental problems, or  Sore throat,                No sneezing, itching, ear ache, nasal congestion, post nasal drip,   CV:  No chest pain,  Orthopnea, PND, swelling in lower extremities, anasarca, dizziness, palpitations, syncope.   GI  No heartburn, indigestion, abdominal pain, nausea, vomiting, diarrhea, change in bowel habits, loss of appetite, bloody stools.   Resp: No shortness of breath with exertion or at rest.  No excess mucus, no productive cough,  No non-productive cough,  No coughing up of blood.  No change in color of mucus.  No wheezing.  No chest wall deformity  Skin: no rash or lesions.  GU: no dysuria, change in color of urine, no urgency or frequency.  No flank pain, no hematuria   MS:  No joint pain or swelling.  No decreased range of motion.  No back pain.    Physical Exam  BP 128/70 (BP Location: Left Arm, Patient Position: Sitting, Cuff Size: Normal)   Pulse 62   Temp (!) 97.4 F (36.3 C) (Oral)   Ht _0  (1.88 m)   Wt 188 lb 12.8 oz (85.6 kg)   SpO2 95%   BMI 24.24 kg/m   GEN: A/Ox3; pleasant , NAD, well nourished    HEENT:  [place]/AT,  NOSE-clear, THROAT-clear, no lesions, no postnasal drip or exudate noted.   NECK:  Supple w/ fair ROM; no JVD; normal carotid impulses w/o bruits; no thyromegaly or nodules palpated; no lymphadenopathy.    RESP  Clear  P & A; w/o, wheezes/ rales/  or rhonchi. no accessory muscle use, no dullness to percussion  CARD:  RRR, no m/r/g, no peripheral edema, pulses intact, no cyanosis or clubbing.  GI:   Soft & nt; nml bowel sounds; no organomegaly or masses detected.   Musco: Warm bil, no deformities or joint swelling noted.   Neuro: alert, no focal deficits noted.    Skin: Warm, no lesions or rashes    Lab Results:  CBC    ProBNP No results found for: "PROBNP"  Imaging: DG  Chest 2 View  Result Date: 12/22/2021 CLINICAL DATA:  Bronchitis with COPD EXAM: CHEST - 2 VIEW COMPARISON:  12/02/2021 FINDINGS: Unchanged cardiac and mediastinal contours. Redemonstrated stigmata of emphysema. Mildly decreased reticulonodular opacities in the bilateral mid and lower lungs, which appears overall similar to 01/14/2021. No pleural effusion or pneumothorax. No acute osseous abnormality. IMPRESSION: Mildly decreased reticulonodular opacities in the bilateral mid and lower lungs compared to 12/02/2021, which now appears overall similar to 01/14/2021. Findings could reflect chronic interstitial lung disease or atypical infection. Electronically Signed   By: Merilyn Baba M.D.   On: 12/22/2021 15:44   DG Chest 2 View  Result Date: 12/03/2021 CLINICAL DATA:  87 year old male with a history of COPD EXAM: CHEST - 2 VIEW COMPARISON:  10/17/2020, 05/18/2019 FINDINGS: Cardiomediastinal silhouette unchanged in size and contour. No evidence of central vascular congestion. No interlobular septal thickening. Stigmata of emphysema, with increased retrosternal airspace, flattened hemidiaphragms, increased AP diameter, and hyperinflation on the AP view. Reticulonodular opacities in the mid and lower lungs, worst than the comparison. No pneumothorax or pleural effusion. No acute displaced fracture. Degenerative changes of the spine. IMPRESSION: Increased reticulonodular opacities, concerning for multifocal infection superimposed on emphysema. Electronically Signed    By: Corrie Mckusick D.O.   On: 12/03/2021 16:23   MR HUMERUS RIGHT W WO CONTRAST  Result Date: 11/28/2021 CLINICAL DATA:  Upper extremity pain. History of MGUS for 5-6 months. Possible new lucent lesions in the proximal right humerus on recent metastatic bone survey. EXAM: MRI OF THE RIGHT HUMERUS WITHOUT AND WITH CONTRAST TECHNIQUE: Multiplanar, multisequence MR imaging of the right upper arm was performed before and after the administration of intravenous contrast. CONTRAST:  77m MULTIHANCE GADOBENATE DIMEGLUMINE 529 MG/ML IV SOLN COMPARISON:  Bone survey 11/09/2017 and 11/17/2021. FINDINGS: Bones/Joint/Cartilage Examination includes the proximal to mid upper arm, including the shoulder. The elbow is not included. No focally suspicious marrow or cortical lesions identified. There is no abnormal osseous enhancement. No evidence of acute fracture or dislocation. Mild glenohumeral degenerative changes with a small shoulder joint effusion which shows diffuse synovial enhancement. There is peripherally enhancing fluid anteriorly in the subcoracoid bursa. Moderate acromioclavicular degenerative changes. Ligaments No ligamentous abnormalities are identified. Muscles and Tendons Evidence of a rotator cuff tear, involving the distal supraspinatus tendon, not optimally evaluated due to large field of view. No focal muscular atrophy or abnormal muscular enhancement identified. Soft tissues No soft tissue masses, fluid collections or enlarged lymph nodes are identified. IMPRESSION: 1. No suspicious or acute osseous findings within the proximal right humerus. 2. Glenohumeral and acromioclavicular degenerative changes with synovial enhancement. 3. Probable full-thickness insertional tear of the supraspinatus tendon. Electronically Signed   By: WRichardean SaleM.D.   On: 11/28/2021 12:19         Latest Ref Rng & Units 10/04/2019   10:53 AM  PFT Results  FVC-Pre L 3.21  P  FVC-Predicted Pre % 78  P  FVC-Post L 3.67  P   FVC-Predicted Post % 90  P  Pre FEV1/FVC % % 68  P  Post FEV1/FCV % % 69  P  FEV1-Pre L 2.18  P  FEV1-Predicted Pre % 73  P  FEV1-Post L 2.55  P  DLCO uncorrected ml/min/mmHg 12.86  P  DLCO UNC% % 49  P  DLCO corrected ml/min/mmHg 12.94  P  DLCO COR %Predicted % 49  P  DLVA Predicted % 50  P  TLC L 7.34  P  TLC % Predicted %  95  P  RV % Predicted % 120  P    P Preliminary result    No results found for: "NITRICOXIDE"      Assessment & Plan:   Acute asthmatic bronchitis Recent flare plus or minus pneumonia now clinically improved.  No further antibiotics or steroids are indicated at this time.  Patient is continue on current maintenance regimen.  Plan Patient Instructions  Mucinex DM Twice daily As needed cough/congestion  Albuterol inhaler As needed   Continue on BREO 1 puff daily, rinse after use.  Follow up with Dr. Silas Flood in 3 months and As needed   Please contact office for sooner follow up if symptoms do not improve or worsen or seek emergency care        Rexene Edison, NP 12/22/2021

## 2021-12-22 NOTE — Patient Instructions (Signed)
Mucinex DM Twice daily As needed cough/congestion  Albuterol inhaler As needed   Continue on BREO 1 puff daily, rinse after use.  Follow up with Dr. Silas Flood in 3 months and As needed   Please contact office for sooner follow up if symptoms do not improve or worsen or seek emergency care

## 2021-12-22 NOTE — Assessment & Plan Note (Signed)
Recent flare plus or minus pneumonia now clinically improved.  No further antibiotics or steroids are indicated at this time.  Patient is continue on current maintenance regimen.  Plan Patient Instructions  Mucinex DM Twice daily As needed cough/congestion  Albuterol inhaler As needed   Continue on BREO 1 puff daily, rinse after use.  Follow up with Dr. Silas Flood in 3 months and As needed   Please contact office for sooner follow up if symptoms do not improve or worsen or seek emergency care

## 2021-12-25 DIAGNOSIS — H11823 Conjunctivochalasis, bilateral: Secondary | ICD-10-CM | POA: Diagnosis not present

## 2021-12-25 DIAGNOSIS — H16223 Keratoconjunctivitis sicca, not specified as Sjogren's, bilateral: Secondary | ICD-10-CM | POA: Diagnosis not present

## 2021-12-25 DIAGNOSIS — H43823 Vitreomacular adhesion, bilateral: Secondary | ICD-10-CM | POA: Diagnosis not present

## 2021-12-25 DIAGNOSIS — H40013 Open angle with borderline findings, low risk, bilateral: Secondary | ICD-10-CM | POA: Diagnosis not present

## 2021-12-25 DIAGNOSIS — Z961 Presence of intraocular lens: Secondary | ICD-10-CM | POA: Diagnosis not present

## 2021-12-25 DIAGNOSIS — H4322 Crystalline deposits in vitreous body, left eye: Secondary | ICD-10-CM | POA: Diagnosis not present

## 2021-12-25 DIAGNOSIS — E119 Type 2 diabetes mellitus without complications: Secondary | ICD-10-CM | POA: Diagnosis not present

## 2021-12-25 DIAGNOSIS — H26491 Other secondary cataract, right eye: Secondary | ICD-10-CM | POA: Diagnosis not present

## 2021-12-31 ENCOUNTER — Encounter: Payer: Self-pay | Admitting: *Deleted

## 2022-01-08 ENCOUNTER — Ambulatory Visit (INDEPENDENT_AMBULATORY_CARE_PROVIDER_SITE_OTHER): Payer: Medicare Other | Admitting: Family Medicine

## 2022-01-08 ENCOUNTER — Ambulatory Visit (INDEPENDENT_AMBULATORY_CARE_PROVIDER_SITE_OTHER): Payer: Medicare Other

## 2022-01-08 ENCOUNTER — Ambulatory Visit: Payer: Self-pay

## 2022-01-08 VITALS — BP 160/72 | Ht 74.0 in | Wt 192.0 lb

## 2022-01-08 DIAGNOSIS — M25571 Pain in right ankle and joints of right foot: Secondary | ICD-10-CM | POA: Diagnosis not present

## 2022-01-08 DIAGNOSIS — M7989 Other specified soft tissue disorders: Secondary | ICD-10-CM | POA: Diagnosis not present

## 2022-01-08 DIAGNOSIS — G8929 Other chronic pain: Secondary | ICD-10-CM | POA: Diagnosis not present

## 2022-01-08 LAB — URIC ACID: Uric Acid, Serum: 8.4 mg/dL — ABNORMAL HIGH (ref 4.0–7.8)

## 2022-01-08 NOTE — Progress Notes (Signed)
I, Nicholas Patton, LAT, ATC acting as a scribe for Nicholas Leader, MD.  HPI: Patient is an 87 year old male presenting with right ankle pain.  Patient was last seen by Dr. Georgina Snell on 04/24/2021 for cellulitis of the left ankle.  Of note, patient was seen at [facility] urgent care on 09/25/2021 for suspected gout in his right great toe.  Today, patient presents with right ankle pain that's been nagging for awhile. Patient locates pain to the medial aspect of the R ankle  Right ankle swelling: yes- into LE Aggravates:  nothing in particular Treatments tried: Prior course of prednisone  Dx testing: 07/09/2021 LE vascular ultrasound reflux study  Pertinent review of systems: No fevers or chills  Relevant historical information: MGUS Diabetic neuropathy. Concern for gout prior visit with urgent care  Exam:  BP (!) 160/72   Ht 6' 2" (1.88 m)   Wt 192 lb (87.1 kg)   BMI 24.65 kg/m  General: Well Developed, well nourished, and in no acute distress.   MSK: Right lower leg 2+ pitting edema.  Tender palpation along the medial lower leg into the medial ankle. Normal ankle motion and strength. Stable ligamentous exam. Intact strength of the ankle.  Capillary fill and sensation are intact distally.    Lab and Radiology Results  Diagnostic Limited MSK Ultrasound of: Right ankle and lower leg Hypoechoic fluid tracks within subcutaneous tissue lower leg consistent with edema. Posterior tibialis tendon visualized.  Intact without severe tenosynovitis changes. No significant joint effusion is present. Impression: Lower extremity edema  X-ray images right ankle obtained today personally and independently interpreted Subtle lucency present within the distal tibia near the medial malleolus and in the distal fibula.  This may represent lesions from multiple myeloma.  No acute fractures or severe degenerative changes are present.  Await formal radiology review. Await formal radiology review     Chemistry      Component Value Date/Time   NA 142 10/30/2021 1007   K 3.6 10/30/2021 1007   CL 109 10/30/2021 1007   CO2 27 10/30/2021 1007   BUN 37 (H) 10/30/2021 1007   CREATININE 1.86 (H) 10/30/2021 1007   CREATININE 1.68 (H) 10/04/2019 1115      Component Value Date/Time   CALCIUM 9.8 10/30/2021 1007   ALKPHOS 110 10/30/2021 1007   AST 18 10/30/2021 1007   ALT 12 10/30/2021 1007   BILITOT 0.6 10/30/2021 1007        Assessment and Plan: 87 y.o. male with right lower leg pain and swelling.  This is thought to be due predominantly to edema.  The underlying cause of the edema is undetermined at this time.  DDD is a possibility.  We will arrange for vascular ultrasound to evaluate for DVT.  Additionally gout is a possibility as well.  He is at risk for gout based on his other medical problems.  We will check uric acid today.  Check back in 1 month.   PDMP not reviewed this encounter. Orders Placed This Encounter  Procedures   [place] LIMITED JOINT SPACE STRUCTURES LOW RIGHT(NO LINKED CHARGES)    Order Specific Question:   Reason for Exam (SYMPTOM  OR DIAGNOSIS REQUIRED)    Answer:   right ankle pain    Order Specific Question:   Preferred imaging location?    Answer:   [facility]   DG Ankle Complete Right    Standing Status:   Future    Number of Occurrences:   1  Standing Expiration Date:   01/09/2023    Order Specific Question:   Reason for Exam (SYMPTOM  OR DIAGNOSIS REQUIRED)    Answer:   right ankle pain    Order Specific Question:   Preferred imaging location?    Answer:   [facility]   Uric acid    Standing Status:   Future    Number of Occurrences:   1    Standing Expiration Date:   01/09/2023   No orders of the defined types were placed in this encounter.    Discussed warning signs or symptoms. Please see discharge instructions. Patient expresses understanding.   The above documentation has been reviewed and is accurate and  complete Nicholas Patton, M.D.

## 2022-01-08 NOTE — Patient Instructions (Addendum)
Thank you for coming in today.   Please get an Xray today before you leave   I ordered a Vascular Ultrasound. You should hear about scheduling soon  Please get labs today before you leave

## 2022-01-09 ENCOUNTER — Ambulatory Visit (HOSPITAL_COMMUNITY)
Admission: RE | Admit: 2022-01-09 | Discharge: 2022-01-09 | Disposition: A | Discharge status: Home / Self Care | Payer: Medicare Other | Source: Ambulatory Visit | Attending: Vascular Surgery | Admitting: Vascular Surgery

## 2022-01-09 DIAGNOSIS — M25571 Pain in right ankle and joints of right foot: Secondary | ICD-10-CM | POA: Insufficient documentation

## 2022-01-09 DIAGNOSIS — G8929 Other chronic pain: Secondary | ICD-10-CM | POA: Insufficient documentation

## 2022-01-09 MED ORDER — ALLOPURINOL 100 MG PO TABS: 100.0000 mg | ORAL_TABLET | Freq: Every day | ORAL | 1 refills | Status: AC

## 2022-01-09 NOTE — Progress Notes (Signed)
Uric acid is elevated at 8.4.  This certainly could cause gout.  Please start allopurinol daily.  This will lower uric acid level.  Recommend recheck in 1 month.

## 2022-01-09 NOTE — Addendum Note (Signed)
Addended by: Gregor Hams on: 01/09/2022 06:40 AM   Modules accepted: Orders

## 2022-01-12 NOTE — Progress Notes (Signed)
Right ankle x-ray had some swelling of the soft tissue in the lower leg but no severe arthritis or fractures are seen.

## 2022-01-12 NOTE — Progress Notes (Signed)
No blood clots were seen in the right leg.

## 2022-01-16 ENCOUNTER — Other Ambulatory Visit: Payer: Self-pay

## 2022-01-16 ENCOUNTER — Other Ambulatory Visit: Payer: Self-pay | Admitting: Internal Medicine

## 2022-01-16 DIAGNOSIS — Z760 Encounter for issue of repeat prescription: Secondary | ICD-10-CM

## 2022-01-16 MED ORDER — TAMSULOSIN HCL 0.4 MG PO CAPS: 0.4000 mg | ORAL_CAPSULE | Freq: Every day | ORAL | 3 refills | Status: DC

## 2022-01-16 MED ORDER — ATORVASTATIN CALCIUM 40 MG PO TABS: 40.0000 mg | ORAL_TABLET | Freq: Every day | ORAL | 3 refills | Status: DC

## 2022-01-16 MED ORDER — PANTOPRAZOLE SODIUM 40 MG PO TBEC: 40.0000 mg | DELAYED_RELEASE_TABLET | Freq: Every day | ORAL | 3 refills | Status: DC

## 2022-01-16 NOTE — Telephone Encounter (Signed)
Please refill as per office routine med refill policy (all routine meds to be refilled for 3 mo or monthly (per pt preference) up to one year from last visit, then month to month grace period for 3 mo, then further med refills will have to be denied)

## 2022-02-02 ENCOUNTER — Telehealth: Payer: Self-pay | Admitting: Pulmonary Disease

## 2022-02-02 MED ORDER — PREDNISONE 20 MG PO TABS: 20.0000 mg | ORAL_TABLET | Freq: Every day | ORAL | 0 refills | Status: DC

## 2022-02-02 NOTE — Telephone Encounter (Signed)
Recommend prednisone 20 mg daily for 5 days

## 2022-02-02 NOTE — Telephone Encounter (Signed)
Called and spoke with pt letting him know recs per [place] and he verbalized understanding. Rx for prednisone sent to preferred pharmacy. Nothing further needed.

## 2022-02-02 NOTE — Telephone Encounter (Signed)
Called and spoke with pt who states he has been having some tightness in his chest and is getting winded more often. Pt also states that he is coughing but states he is not coughing up any phlegm. Does have a mild wheeze.  Pt states he has used his rescue inhaler a couple of times to help with his breathing. Is using his breo every day as directed.  Due to pt's symptoms, he wants to know what could be recommended. Dr. Silas Flood, please advise.

## 2022-02-05 ENCOUNTER — Encounter: Payer: Self-pay | Admitting: Podiatry

## 2022-02-05 ENCOUNTER — Ambulatory Visit (INDEPENDENT_AMBULATORY_CARE_PROVIDER_SITE_OTHER): Payer: Medicare Other | Admitting: Podiatry

## 2022-02-05 DIAGNOSIS — M79674 Pain in right toe(s): Secondary | ICD-10-CM | POA: Diagnosis not present

## 2022-02-05 DIAGNOSIS — B351 Tinea unguium: Secondary | ICD-10-CM

## 2022-02-05 DIAGNOSIS — M79675 Pain in left toe(s): Secondary | ICD-10-CM | POA: Diagnosis not present

## 2022-02-06 NOTE — Progress Notes (Signed)
Subjective:   Patient ID: Nicholas Patton, male   DOB: 87 y.o.   MRN: [id number]   HPI Patient presents with nail disease with discomfort 1-5 both feet that he cannot take care of   ROS      Objective:  Physical Exam  Neurovascular status unchanged thick yellow brittle nailbeds 1-5 both feet painful     Assessment:  Chronic mycotic nail infection with pain 1-5 both feet     Plan:  Debridement nailbeds 1-5 both feet no iatrogenic bleeding presents for routine care

## 2022-02-11 ENCOUNTER — Ambulatory Visit (INDEPENDENT_AMBULATORY_CARE_PROVIDER_SITE_OTHER): Payer: Medicare Other | Admitting: Pulmonary Disease

## 2022-02-11 ENCOUNTER — Encounter: Payer: Self-pay | Admitting: Pulmonary Disease

## 2022-02-11 VITALS — BP 128/76 | HR 66 | Temp 98.4°F | Wt 194.4 lb

## 2022-02-11 DIAGNOSIS — R0609 Other forms of dyspnea: Secondary | ICD-10-CM | POA: Diagnosis not present

## 2022-02-11 MED ORDER — BREZTRI AEROSPHERE 160-9-4.8 MCG/ACT IN AERO: 2.0000 | INHALATION_SPRAY | Freq: Two times a day (BID) | RESPIRATORY_TRACT | 0 refills | Status: DC

## 2022-02-11 MED ORDER — BREZTRI AEROSPHERE 160-9-4.8 MCG/ACT IN AERO: 2.0000 | INHALATION_SPRAY | Freq: Two times a day (BID) | RESPIRATORY_TRACT | 2 refills | Status: DC

## 2022-02-11 NOTE — Patient Instructions (Addendum)
Nice to see you again  Lets try a different inhaler, Breztri 2 puffs twice a day every day.  Rinse your mouth out with water after every use.  This adds an additional medicine that Breo does not have.  Stop Breo once you start Breztri.  Please let me know if is too expensive  I ordered a CT scan of the chest to make sure not missing other reasons for your worsening shortness of breath.  Given that this is worse with cold air and with the wheeze, I think asthma of the right diagnosis is needed better medicines are stronger medicines for this.  Return to clinic in 2 months or sooner as needed with Dr. Silas Flood

## 2022-02-18 DIAGNOSIS — N184 Chronic kidney disease, stage 4 (severe): Secondary | ICD-10-CM | POA: Diagnosis not present

## 2022-02-24 ENCOUNTER — Encounter: Payer: Self-pay | Admitting: Pulmonary Disease

## 2022-02-24 ENCOUNTER — Ambulatory Visit: Payer: Medicare Other | Admitting: Podiatry

## 2022-02-24 DIAGNOSIS — N184 Chronic kidney disease, stage 4 (severe): Secondary | ICD-10-CM | POA: Diagnosis not present

## 2022-02-24 DIAGNOSIS — R600 Localized edema: Secondary | ICD-10-CM | POA: Diagnosis not present

## 2022-02-24 DIAGNOSIS — N2581 Secondary hyperparathyroidism of renal origin: Secondary | ICD-10-CM | POA: Diagnosis not present

## 2022-02-24 DIAGNOSIS — D472 Monoclonal gammopathy: Secondary | ICD-10-CM | POA: Diagnosis not present

## 2022-02-24 DIAGNOSIS — D631 Anemia in chronic kidney disease: Secondary | ICD-10-CM | POA: Diagnosis not present

## 2022-02-24 DIAGNOSIS — I129 Hypertensive chronic kidney disease with stage 1 through stage 4 chronic kidney disease, or unspecified chronic kidney disease: Secondary | ICD-10-CM | POA: Diagnosis not present

## 2022-02-24 DIAGNOSIS — M109 Gout, unspecified: Secondary | ICD-10-CM | POA: Diagnosis not present

## 2022-02-25 NOTE — Telephone Encounter (Signed)
Mychart message sent by pt: [facility] (supporting Lanier Clam, MD)19 hours ago (2:40 PM)    Dr. Silas Flood,    The cost of the medicine Judithann Sauger) [phone] for a 90 day supply.  You ask that I let you know if the medicine was expensive, since I am on fixed income.  Coughing, going to bathroom and being horace more.    Thank you  [name]     Dr. Silas Flood, please advise.

## 2022-02-25 NOTE — Telephone Encounter (Signed)
Can we confirm - it sounds like symptoms are no better, maybe worse on Breztri?

## 2022-03-10 NOTE — Telephone Encounter (Signed)
I am not sure what else to try. Can return to [facility]. Will be looking for results of CT scan later this week.

## 2022-03-13 ENCOUNTER — Ambulatory Visit
Admission: RE | Admit: 2022-03-13 | Discharge: 2022-03-13 | Disposition: A | Discharge status: Home / Self Care | Payer: Medicare Other | Source: Ambulatory Visit | Attending: Pulmonary Disease | Admitting: Pulmonary Disease

## 2022-03-13 DIAGNOSIS — I288 Other diseases of pulmonary vessels: Secondary | ICD-10-CM | POA: Diagnosis not present

## 2022-03-13 DIAGNOSIS — J432 Centrilobular emphysema: Secondary | ICD-10-CM | POA: Diagnosis not present

## 2022-03-13 DIAGNOSIS — R0609 Other forms of dyspnea: Secondary | ICD-10-CM

## 2022-03-13 DIAGNOSIS — I700 Atherosclerosis of aorta: Secondary | ICD-10-CM | POA: Diagnosis not present

## 2022-03-13 DIAGNOSIS — K449 Diaphragmatic hernia without obstruction or gangrene: Secondary | ICD-10-CM | POA: Diagnosis not present

## 2022-03-25 ENCOUNTER — Ambulatory Visit (INDEPENDENT_AMBULATORY_CARE_PROVIDER_SITE_OTHER): Payer: Medicare Other | Admitting: Pulmonary Disease

## 2022-03-25 ENCOUNTER — Encounter: Payer: Self-pay | Admitting: Pulmonary Disease

## 2022-03-25 VITALS — BP 130/68 | HR 63 | Temp 98.6°F | Wt 194.8 lb

## 2022-03-25 DIAGNOSIS — J432 Centrilobular emphysema: Secondary | ICD-10-CM

## 2022-03-25 DIAGNOSIS — R0609 Other forms of dyspnea: Secondary | ICD-10-CM

## 2022-03-25 MED ORDER — FLUTICASONE FUROATE-VILANTEROL 200-25 MCG/ACT IN AEPB: 1.0000 | INHALATION_SPRAY | Freq: Every day | RESPIRATORY_TRACT | 3 refills | Status: DC

## 2022-03-25 NOTE — Patient Instructions (Signed)
Nice to see you again  The CT scan is reassuring, no new findings to worry about  Stop Breztri symptoms making things worse  Resume Breo 1 puff once a day.  I sent a prescription in.  Return to clinic in 6 months or sooner as needed with Dr. Silas Flood

## 2022-03-25 NOTE — Progress Notes (Signed)
Patient ID: Nicholas Patton, male    DOB: Aug 16, 1934, 88 y.o.   MRN: [id number]  Chief Complaint  Patient presents with   Acute Visit    Pt is here for acute visit. He states for a few months now he is having issues catching his breath. He saw TP in October for the same issues. He states that it is still occurring. Pt states he is taking the Breo every morning but he is having to use his albuterol in the evening. He states he feel like the Breo does not last long.     Referring provider: [name], MD  HPI:   Nicholas Patton is a 88 y.o. man with reported "COPD" with spirometry without fixed obstruction but with significant bronchodilator response whom we are seeing in follow up for asthma, DOE and acute visit for similar symptoms.  Recent note from pulmonary clinic reviewed.  Patient recently seen in the office for worsening symptoms.  Timing subacute, going on for the last few months.  Hard to pinpoint.  Tried different commendation of inhalers.  Currently on Breo.  Does not think it helps as much as he used to.  Discussed additional testing to try to figure out what is going on.  He is amenable to this.  HPI at initial visit: Has had some mild dyspnea exertion going on some time now.  At least months to years.  He has been told he has COPD in the past but has never had spirometry.  Shortness of breath worse walking longer distances or on inclines.  Rest resolve symptoms.  No real shortness of breath at rest.  He has hayfever, seasonal allergies regularly.  Will use medicines to treat this when bad.  No recurrent pneumonias in the past no real bad bouts of bronchitis.  No asthma or exertional limitation as a child.  No syncope or presyncope.  Reportedly has occasionally received prednisone for some breathing difficulties in the past.  Is been at least 2 years or more since last use.  Spirometry recently demonstrates ratio at 68 to 69% but given his age this is over 90% predicted and  within normal limits.  Significant bronchodilator response in both FEV1 and FVC.  CT scan 05/2019 personally reviewed and interpreted as diffuse significant upper lobe predominant emphysema without other significant abnormality.  PMH: Diabetes, hypertension Surgical history: Cataracts, neck surgery/fusion next knee replacement, cholecystectomy Family history: No history of lung cancer Social history: Former smoker, quit late 80s, lives in [place] / Pulmonary Flowsheets:   ACT:      No data to display           MMRC: mMRC Dyspnea Scale mMRC Score  11/18/2020  4:28 PM 3  05/15/2020  1:35 PM 0    Epworth:      No data to display           Tests:   FENO:  No results found for: "NITRICOXIDE"  PFT:    Latest Ref Rng & Units 10/04/2019   10:53 AM  PFT Results  FVC-Pre L 3.21  P  FVC-Predicted Pre % 78  P  FVC-Post L 3.67  P  FVC-Predicted Post % 90  P  Pre FEV1/FVC % % 68  P  Post FEV1/FCV % % 69  P  FEV1-Pre L 2.18  P  FEV1-Predicted Pre % 73  P  FEV1-Post L 2.55  P  DLCO uncorrected ml/min/mmHg 12.86  P  DLCO UNC% %  49  P  DLCO corrected ml/min/mmHg 12.94  P  DLCO COR %Predicted % 49  P  DLVA Predicted % 50  P  TLC L 7.34  P  TLC % Predicted % 95  P  RV % Predicted % 120  P    P Preliminary result   Personally reviewed and interpreted as normal spirometry with significant bronchodilator response.  TLC within normal limits, RV gas trapping.  DLCO severely reduced.  WALK:      No data to display           Imaging: CT Chest High Resolution  Result Date: 03/16/2022 CLINICAL DATA:  Worsening dyspnea on exertion.  Former smoker. EXAM: CT CHEST WITHOUT CONTRAST TECHNIQUE: Multidetector CT imaging of the chest was performed following the standard protocol without intravenous contrast. High resolution imaging of the lungs, as well as inspiratory and expiratory imaging, was performed. RADIATION DOSE REDUCTION: This exam was performed  according to the departmental dose-optimization program which includes automated exposure control, adjustment of the mA and/or kV according to patient size and/or use of iterative reconstruction technique. COMPARISON:  05/18/2019 chest CT angiogram. FINDINGS: Cardiovascular: Normal heart size. No significant pericardial effusion/thickening. Atherosclerotic nonaneurysmal thoracic aorta. Dilated main pulmonary artery (3.5 cm diameter). Mediastinum/Nodes: No significant thyroid nodules. Unremarkable esophagus. No axillary adenopathy. Coarsely calcified nonenlarged paratracheal, subcarinal and right hilar nodes are unchanged and compatible with prior granulomatous disease. No pathologically enlarged noncalcified mediastinal or discrete hilar nodes on these noncontrast images. Lungs/Pleura: No pneumothorax. No pleural effusion. Moderate to severe centrilobular emphysema. No acute consolidative airspace disease, lung masses or significant pulmonary nodules. No significant regions of subpleural reticulation, ground-glass opacity, traction bronchiectasis, architectural distortion or frank honeycombing. A few scattered thin parenchymal bands in the dependent basilar lower lobes bilaterally compatible with nonspecific mild postinfectious/postinflammatory scarring. No significant regions of lobular air trapping or evidence of tracheobronchomalacia on the expiration sequence. Upper abdomen: Small hiatal hernia. Cholecystectomy. Simple 3.2 cm anterior upper left renal cyst, for which no follow-up imaging is recommended. Musculoskeletal: No aggressive appearing focal osseous lesions. Mild thoracic spondylosis. IMPRESSION: 1. No evidence of interstitial lung disease. 2. Scattered small parenchymal bands at the dependent lung bases compatible with nonspecific mild postinfectious/postinflammatory scarring. 3. Moderate to severe centrilobular emphysema. 4. Dilated main pulmonary artery, suggesting pulmonary arterial hypertension. 5.  Small hiatal hernia. 6. Aortic Atherosclerosis (ICD10-I70.0) and Emphysema (ICD10-J43.9). Electronically Signed   By: Ilona Sorrel M.D.   On: 03/16/2022 11:11    Lab Results:  CBC    Component Value Date/Time   WBC 3.1 (L) 10/30/2021 1007   WBC 4.1 04/21/2021 1551   RBC 4.30 10/30/2021 1007   HGB 13.0 10/30/2021 1007   HCT 39.1 10/30/2021 1007   PLT 146 (L) 10/30/2021 1007   MCV 90.9 10/30/2021 1007   MCH 30.2 10/30/2021 1007   MCHC 33.2 10/30/2021 1007   RDW 14.3 10/30/2021 1007   LYMPHSABS 0.9 10/30/2021 1007   MONOABS 0.3 10/30/2021 1007   EOSABS 0.1 10/30/2021 1007   BASOSABS 0.0 10/30/2021 1007    BMET    Component Value Date/Time   NA 142 10/30/2021 1007   K 3.6 10/30/2021 1007   CL 109 10/30/2021 1007   CO2 27 10/30/2021 1007   GLUCOSE 209 (H) 10/30/2021 1007   BUN 37 (H) 10/30/2021 1007   CREATININE 1.86 (H) 10/30/2021 1007   CREATININE 1.68 (H) 10/04/2019 1115   CALCIUM 9.8 10/30/2021 1007   GFRNONAA 35 (L) 10/30/2021 1007   GFRNONAA 37 (  L) 10/04/2019 1115   GFRAA 41 (L) 10/25/2019 1052   GFRAA 43 (L) 10/04/2019 1115    BNP    Component Value Date/Time   BNP 48.5 05/18/2019 1918    ProBNP No results found for: "PROBNP"  Specialty Problems       Pulmonary Problems   Allergic rhinitis    Qualifier: Diagnosis of  By: [name] MD, Hunt Oris       Acute upper respiratory infection   Dyspnea   Left-sided nosebleed   Asthma   Acute cough   Acute asthmatic bronchitis    Allergies  Allergen Reactions   Amlodipine Swelling   Cymbalta [Duloxetine Hcl] Other (See Comments)   Oxycodone Other (See Comments)    Immunization History  Administered Date(s) Administered   Fluad Quad(high Dose 65+) 11/09/2018, 11/30/2019, 12/14/2020   Influenza Whole 11/21/2015   Influenza, High Dose Seasonal PF 01/24/2013, 12/01/2016, 11/22/2017   Influenza,inj,Quad PF,6+ Mos 12/08/2013   Influenza-Unspecified 02/08/2015   PFIZER(Purple Top)SARS-COV-2 Vaccination  05/03/2019, 05/04/2019, 12/23/2019, 10/10/2020   Pneumococcal Conjugate-13 03/22/2013   Pneumococcal Polysaccharide-23 01/10/2016   Td 10/05/2014   Zoster Recombinat (Shingrix) 07/25/2021    Past Medical History:  Diagnosis Date   Anemia    Arthritis    Cataract    REMOVED BILATERAL   Colon polyps    DIABETES MELLITUS, TYPE II    takes Metformin and Glimepiride daily   Diverticulosis    GERD (gastroesophageal reflux disease)    takes Protonix daily   History of colon polyps    HYPERLIPIDEMIA    takes Atorvastatin daily   HYPERTENSION    takes Amlodipine and Hyzaar daily   Ileus following gastrointestinal surgery ([place]) 07/22/2012   Joint pain    Peripheral neuropathy    Peripheral neuropathy    PERIPHERAL NEUROPATHY, FEET 10/06/2007   Renal disorder    VITAMIN B12 DEFICIENCY 09/06/2008   VITAMIN D DEFICIENCY 01/16/2010   takes Vitamin D daily    Tobacco History: Social History   Tobacco Use  Smoking Status Former   Packs/day: 1.00   Years: 31.00   Total pack years: 31.00   Types: Cigarettes   Quit date: 12/23/1983   Years since quitting: 38.2   Passive exposure: Never  Smokeless Tobacco Never   Counseling given: Not Answered    Continue to not smoke  Outpatient Encounter Medications as of 02/11/2022  Medication Sig   albuterol (VENTOLIN HFA) 108 (90 Base) MCG/ACT inhaler INHALE 2 PUFFS BY MOUTH EVERY 6 HOURS AS NEEDED FOR WHEEZING FOR SHORTNESS OF BREATH   allopurinol (ZYLOPRIM) 100 MG tablet Take 1 tablet (100 mg total) by mouth daily.   aspirin 81 MG EC tablet Take 81 mg by mouth daily.   atorvastatin (LIPITOR) 40 MG tablet Take 1 tablet (40 mg total) by mouth daily.   carvedilol (COREG) 12.5 MG tablet TAKE 1 TABLET BY MOUTH  TWICE DAILY WITH MEALS   doxazosin (CARDURA) 2 MG tablet Take 1 tablet (2 mg total) by mouth daily.   finasteride (PROSCAR) 5 MG tablet Take 5 mg by mouth daily.   furosemide (LASIX) 20 MG tablet Take 20 mg by mouth daily.    glimepiride (AMARYL) 2 MG tablet 1 tab by mouth once daily   losartan (COZAAR) 100 MG tablet TAKE 1 TABLET BY MOUTH  DAILY   Multiple Vitamins-Minerals (MULTIVITAMIN PO) Take 1 tablet by mouth daily.   pantoprazole (PROTONIX) 40 MG tablet Take 1 tablet (40 mg total) by mouth daily.   PAXIL  10 MG tablet Take 10 mg by mouth daily.   pioglitazone (ACTOS) 15 MG tablet TAKE 1 TABLET BY MOUTH DAILY   predniSONE (DELTASONE) 20 MG tablet Take 1 tablet (20 mg total) by mouth daily with breakfast.   tamsulosin (FLOMAX) 0.4 MG CAPS capsule Take 1 capsule (0.4 mg total) by mouth daily.   [DISCONTINUED] BREO ELLIPTA 200-25 MCG/ACT AEPB USE 1 INHALATION BY MOUTH  DAILY (Patient not taking: Reported on 03/25/2022)   [DISCONTINUED] Budeson-Glycopyrrol-Formoterol (BREZTRI AEROSPHERE) 160-9-4.8 MCG/ACT AERO Inhale 2 puffs into the lungs in the morning and at bedtime. (Patient not taking: Reported on 03/25/2022)   [DISCONTINUED] Budeson-Glycopyrrol-Formoterol (BREZTRI AEROSPHERE) 160-9-4.8 MCG/ACT AERO Inhale 2 puffs into the lungs in the morning and at bedtime. (Patient not taking: Reported on 03/25/2022)   No facility-administered encounter medications on file as of 02/11/2022.     Review of Systems  Review of Systems  N/a Physical Exam  BP 128/76 (BP Location: Left Arm, Patient Position: Sitting, Cuff Size: Normal)   Pulse 66   Temp 98.4 F (36.9 C) (Oral)   Wt 194 lb 6.4 oz (88.2 kg)   SpO2 100%   BMI 24.96 kg/m   Wt Readings from Last 5 Encounters:  03/25/22 194 lb 12.8 oz (88.4 kg)  02/11/22 194 lb 6.4 oz (88.2 kg)  01/08/22 192 lb (87.1 kg)  12/22/21 188 lb 12.8 oz (85.6 kg)  12/02/21 193 lb 3.2 oz (87.6 kg)    BMI Readings from Last 5 Encounters:  03/25/22 25.01 kg/m  02/11/22 24.96 kg/m  01/08/22 24.65 kg/m  12/22/21 24.24 kg/m  12/02/21 24.81 kg/m     Physical Exam General: Well-appearing, in no acute distress Eyes: EOMI, no icterus Neck: Supple, no JVP appreciated sitting  upright Respiratory: Clear to auscultation bilaterally, normal work of breathing on room air Extremities: Warm, no edema MSK: No synovitis, no joint effusions Neuro: Normal gait, no weakness Psych: Normal mood, full affect   Assessment & Plan:   Nicholas Patton is a 88 y.o. man with reported COPD with recent spirometry without fixed obstruction but with significant bronchodilator response whom we are seeing for follow up for evaluation of DOE.  DOE: PFTs are suggestive of asthma and this is likely a driver of symptoms.  Some of deconditioning given he admits to being less active.  Improved with ICS/LABA therapy but still present. Additionally, DLCO is severely reduced which is related to emphysema seen on CT but also concern for contribution of pulmonary HTN given elevated PA pressures seen on TTE in 2016. Repeat TTE 10/2019 with mildly elevated PASP, RV enlargement but normal RV function and size.  Also developing element of deconditioning given his lack of activity in the preceding months.  Escalated to Trelegy fall 2022 without improvement.  Subsequent improvement after resuming Memory Dance Winter 2022.  With what seemed to be less improvement over the last several months.  Escalate to [employer].  Will obtain high-resolution CT scan in the meantime make sure not missing other structural issues that are undiagnosed currently.  Asthma: Had improved with ICS/LABA.  Encouraged increased exercise. Back on Breo.   Return in about 2 months (around 04/14/2022).   Lanier Clam, MD 03/25/2022

## 2022-03-26 NOTE — Progress Notes (Signed)
Patient ID: Nicholas Patton, male    DOB: 1934-10-27, 88 y.o.   MRN: [id number]  Chief Complaint  Patient presents with   Follow-up    Pt is here for follow up for DOE. Pt states that the Judithann Sauger is making his cough more. He would like to go back to the breo if possible. Still using albuterol as needed. And on daily prednisone     Referring provider: [name], MD  HPI:   Nicholas Patton is a 88 y.o. man with reported "COPD" with spirometry without fixed obstruction but with significant bronchodilator response whom we are seeing in follow up for asthma, DOE.    Returns for scheduled follow-up.  CT high-resolution in the interim reveals no ILD but significant emphysematous changes.  We discussed at length his ongoing shortness of breath.  Escalate the Breo at last visit.  Felt like it made cough worse.  Overall not very helpful.  Discussed at length etiologies of his shortness of breath and difficulty with treating.  HPI at initial visit: Has had some mild dyspnea exertion going on some time now.  At least months to years.  He has been told he has COPD in the past but has never had spirometry.  Shortness of breath worse walking longer distances or on inclines.  Rest resolve symptoms.  No real shortness of breath at rest.  He has hayfever, seasonal allergies regularly.  Will use medicines to treat this when bad.  No recurrent pneumonias in the past no real bad bouts of bronchitis.  No asthma or exertional limitation as a child.  No syncope or presyncope.  Reportedly has occasionally received prednisone for some breathing difficulties in the past.  Is been at least 2 years or more since last use.  Spirometry recently demonstrates ratio at 68 to 69% but given his age this is over 90% predicted and within normal limits.  Significant bronchodilator response in both FEV1 and FVC.  CT scan 05/2019 personally reviewed and interpreted as diffuse significant upper lobe predominant emphysema without  other significant abnormality.  PMH: Diabetes, hypertension Surgical history: Cataracts, neck surgery/fusion next knee replacement, cholecystectomy Family history: No history of lung cancer Social history: Former smoker, quit late 80s, lives in [place] / Pulmonary Flowsheets:   ACT:      No data to display           MMRC: mMRC Dyspnea Scale mMRC Score  11/18/2020  4:28 PM 3  05/15/2020  1:35 PM 0    Epworth:      No data to display           Tests:   FENO:  No results found for: "NITRICOXIDE"  PFT:    Latest Ref Rng & Units 10/04/2019   10:53 AM  PFT Results  FVC-Pre L 3.21  P  FVC-Predicted Pre % 78  P  FVC-Post L 3.67  P  FVC-Predicted Post % 90  P  Pre FEV1/FVC % % 68  P  Post FEV1/FCV % % 69  P  FEV1-Pre L 2.18  P  FEV1-Predicted Pre % 73  P  FEV1-Post L 2.55  P  DLCO uncorrected ml/min/mmHg 12.86  P  DLCO UNC% % 49  P  DLCO corrected ml/min/mmHg 12.94  P  DLCO COR %Predicted % 49  P  DLVA Predicted % 50  P  TLC L 7.34  P  TLC % Predicted % 95  P  RV % Predicted % 120  P    P Preliminary result   Personally reviewed and interpreted as normal spirometry with significant bronchodilator response.  TLC within normal limits, RV gas trapping.  DLCO severely reduced.  WALK:      No data to display           Imaging: CT Chest High Resolution  Result Date: 03/16/2022 CLINICAL DATA:  Worsening dyspnea on exertion.  Former smoker. EXAM: CT CHEST WITHOUT CONTRAST TECHNIQUE: Multidetector CT imaging of the chest was performed following the standard protocol without intravenous contrast. High resolution imaging of the lungs, as well as inspiratory and expiratory imaging, was performed. RADIATION DOSE REDUCTION: This exam was performed according to the departmental dose-optimization program which includes automated exposure control, adjustment of the mA and/or kV according to patient size and/or use of iterative reconstruction  technique. COMPARISON:  05/18/2019 chest CT angiogram. FINDINGS: Cardiovascular: Normal heart size. No significant pericardial effusion/thickening. Atherosclerotic nonaneurysmal thoracic aorta. Dilated main pulmonary artery (3.5 cm diameter). Mediastinum/Nodes: No significant thyroid nodules. Unremarkable esophagus. No axillary adenopathy. Coarsely calcified nonenlarged paratracheal, subcarinal and right hilar nodes are unchanged and compatible with prior granulomatous disease. No pathologically enlarged noncalcified mediastinal or discrete hilar nodes on these noncontrast images. Lungs/Pleura: No pneumothorax. No pleural effusion. Moderate to severe centrilobular emphysema. No acute consolidative airspace disease, lung masses or significant pulmonary nodules. No significant regions of subpleural reticulation, ground-glass opacity, traction bronchiectasis, architectural distortion or frank honeycombing. A few scattered thin parenchymal bands in the dependent basilar lower lobes bilaterally compatible with nonspecific mild postinfectious/postinflammatory scarring. No significant regions of lobular air trapping or evidence of tracheobronchomalacia on the expiration sequence. Upper abdomen: Small hiatal hernia. Cholecystectomy. Simple 3.2 cm anterior upper left renal cyst, for which no follow-up imaging is recommended. Musculoskeletal: No aggressive appearing focal osseous lesions. Mild thoracic spondylosis. IMPRESSION: 1. No evidence of interstitial lung disease. 2. Scattered small parenchymal bands at the dependent lung bases compatible with nonspecific mild postinfectious/postinflammatory scarring. 3. Moderate to severe centrilobular emphysema. 4. Dilated main pulmonary artery, suggesting pulmonary arterial hypertension. 5. Small hiatal hernia. 6. Aortic Atherosclerosis (ICD10-I70.0) and Emphysema (ICD10-J43.9). Electronically Signed   By: Ilona Sorrel M.D.   On: 03/16/2022 11:11    Lab Results:  CBC     Component Value Date/Time   WBC 3.1 (L) 10/30/2021 1007   WBC 4.1 04/21/2021 1551   RBC 4.30 10/30/2021 1007   HGB 13.0 10/30/2021 1007   HCT 39.1 10/30/2021 1007   PLT 146 (L) 10/30/2021 1007   MCV 90.9 10/30/2021 1007   MCH 30.2 10/30/2021 1007   MCHC 33.2 10/30/2021 1007   RDW 14.3 10/30/2021 1007   LYMPHSABS 0.9 10/30/2021 1007   MONOABS 0.3 10/30/2021 1007   EOSABS 0.1 10/30/2021 1007   BASOSABS 0.0 10/30/2021 1007    BMET    Component Value Date/Time   NA 142 10/30/2021 1007   K 3.6 10/30/2021 1007   CL 109 10/30/2021 1007   CO2 27 10/30/2021 1007   GLUCOSE 209 (H) 10/30/2021 1007   BUN 37 (H) 10/30/2021 1007   CREATININE 1.86 (H) 10/30/2021 1007   CREATININE 1.68 (H) 10/04/2019 1115   CALCIUM 9.8 10/30/2021 1007   GFRNONAA 35 (L) 10/30/2021 1007   GFRNONAA 37 (L) 10/04/2019 1115   GFRAA 41 (L) 10/25/2019 1052   GFRAA 43 (L) 10/04/2019 1115    BNP    Component Value Date/Time   BNP 48.5 05/18/2019 1918    ProBNP No results found for: "PROBNP"  Specialty Problems  Pulmonary Problems   Allergic rhinitis    Qualifier: Diagnosis of  By: [name] MD, Hunt Oris       Acute upper respiratory infection   Dyspnea   Left-sided nosebleed   Asthma   Acute cough   Acute asthmatic bronchitis    Allergies  Allergen Reactions   Amlodipine Swelling   Cymbalta [Duloxetine Hcl] Other (See Comments)   Oxycodone Other (See Comments)    Immunization History  Administered Date(s) Administered   Fluad Quad(high Dose 65+) 11/09/2018, 11/30/2019, 12/14/2020   Influenza Whole 11/21/2015   Influenza, High Dose Seasonal PF 01/24/2013, 12/01/2016, 11/22/2017   Influenza,inj,Quad PF,6+ Mos 12/08/2013   Influenza-Unspecified 02/08/2015   PFIZER(Purple Top)SARS-COV-2 Vaccination 05/03/2019, 05/04/2019, 12/23/2019, 10/10/2020   Pneumococcal Conjugate-13 03/22/2013   Pneumococcal Polysaccharide-23 01/10/2016   Td 10/05/2014   Zoster Recombinat (Shingrix) 07/25/2021     Past Medical History:  Diagnosis Date   Anemia    Arthritis    Cataract    REMOVED BILATERAL   Colon polyps    DIABETES MELLITUS, TYPE II    takes Metformin and Glimepiride daily   Diverticulosis    GERD (gastroesophageal reflux disease)    takes Protonix daily   History of colon polyps    HYPERLIPIDEMIA    takes Atorvastatin daily   HYPERTENSION    takes Amlodipine and Hyzaar daily   Ileus following gastrointestinal surgery ([place]) 07/22/2012   Joint pain    Peripheral neuropathy    Peripheral neuropathy    PERIPHERAL NEUROPATHY, FEET 10/06/2007   Renal disorder    VITAMIN B12 DEFICIENCY 09/06/2008   VITAMIN D DEFICIENCY 01/16/2010   takes Vitamin D daily    Tobacco History: Social History   Tobacco Use  Smoking Status Former   Packs/day: 1.00   Years: 31.00   Total pack years: 31.00   Types: Cigarettes   Quit date: 12/23/1983   Years since quitting: 38.2   Passive exposure: Never  Smokeless Tobacco Never   Counseling given: Not Answered    Continue to not smoke  Outpatient Encounter Medications as of 03/25/2022  Medication Sig   albuterol (VENTOLIN HFA) 108 (90 Base) MCG/ACT inhaler INHALE 2 PUFFS BY MOUTH EVERY 6 HOURS AS NEEDED FOR WHEEZING FOR SHORTNESS OF BREATH   allopurinol (ZYLOPRIM) 100 MG tablet Take 1 tablet (100 mg total) by mouth daily.   aspirin 81 MG EC tablet Take 81 mg by mouth daily.   atorvastatin (LIPITOR) 40 MG tablet Take 1 tablet (40 mg total) by mouth daily.   carvedilol (COREG) 12.5 MG tablet TAKE 1 TABLET BY MOUTH  TWICE DAILY WITH MEALS   doxazosin (CARDURA) 2 MG tablet Take 1 tablet (2 mg total) by mouth daily.   finasteride (PROSCAR) 5 MG tablet Take 5 mg by mouth daily.   fluticasone furoate-vilanterol (BREO ELLIPTA) 200-25 MCG/ACT AEPB Inhale 1 puff into the lungs daily.   furosemide (LASIX) 20 MG tablet Take 20 mg by mouth daily.   glimepiride (AMARYL) 2 MG tablet 1 tab by mouth once daily   losartan (COZAAR) 100 MG tablet  TAKE 1 TABLET BY MOUTH  DAILY   Multiple Vitamins-Minerals (MULTIVITAMIN PO) Take 1 tablet by mouth daily.   pantoprazole (PROTONIX) 40 MG tablet Take 1 tablet (40 mg total) by mouth daily.   PAXIL 10 MG tablet Take 10 mg by mouth daily.   pioglitazone (ACTOS) 15 MG tablet TAKE 1 TABLET BY MOUTH DAILY   predniSONE (DELTASONE) 20 MG tablet Take 1 tablet (20 mg total)  by mouth daily with breakfast.   tamsulosin (FLOMAX) 0.4 MG CAPS capsule Take 1 capsule (0.4 mg total) by mouth daily.   [DISCONTINUED] BREO ELLIPTA 200-25 MCG/ACT AEPB USE 1 INHALATION BY MOUTH  DAILY (Patient not taking: Reported on 03/25/2022)   [DISCONTINUED] Budeson-Glycopyrrol-Formoterol (BREZTRI AEROSPHERE) 160-9-4.8 MCG/ACT AERO Inhale 2 puffs into the lungs in the morning and at bedtime. (Patient not taking: Reported on 03/25/2022)   [DISCONTINUED] Budeson-Glycopyrrol-Formoterol (BREZTRI AEROSPHERE) 160-9-4.8 MCG/ACT AERO Inhale 2 puffs into the lungs in the morning and at bedtime. (Patient not taking: Reported on 03/25/2022)   No facility-administered encounter medications on file as of 03/25/2022.     Review of Systems  Review of Systems  N/a Physical Exam  BP 130/68 (BP Location: Left Arm, Patient Position: Sitting, Cuff Size: Normal)   Pulse 63   Temp 98.6 F (37 C) (Oral)   Wt 194 lb 12.8 oz (88.4 kg)   SpO2 97%   BMI 25.01 kg/m   Wt Readings from Last 5 Encounters:  03/25/22 194 lb 12.8 oz (88.4 kg)  02/11/22 194 lb 6.4 oz (88.2 kg)  01/08/22 192 lb (87.1 kg)  12/22/21 188 lb 12.8 oz (85.6 kg)  12/02/21 193 lb 3.2 oz (87.6 kg)    BMI Readings from Last 5 Encounters:  03/25/22 25.01 kg/m  02/11/22 24.96 kg/m  01/08/22 24.65 kg/m  12/22/21 24.24 kg/m  12/02/21 24.81 kg/m     Physical Exam General: Well-appearing, in no acute distress Eyes: EOMI, no icterus Neck: Supple, no JVP appreciated sitting upright Respiratory: Clear to auscultation bilaterally, normal work of breathing on room  air Extremities: Warm, no edema MSK: No synovitis, no joint effusions Neuro: Normal gait, no weakness Psych: Normal mood, full affect   Assessment & Plan:   Nicholas Patton is a 88 y.o. man with reported COPD with recent spirometry without fixed obstruction but with significant bronchodilator response whom we are seeing for follow up for evaluation of DOE.  DOE: PFTs are suggestive of asthma and this is likely a driver of symptoms.  Some of deconditioning given he admits to being less active.  Improved with ICS/LABA therapy but still present. Additionally, DLCO is severely reduced which is related to emphysema seen on CT but also concern for contribution of pulmonary HTN given elevated PA pressures seen on TTE in 2016. Repeat TTE 10/2019 with mildly elevated PASP, RV enlargement but normal RV function and size.  Also developing element of deconditioning given his lack of activity in the preceding months.  Escalated to Trelegy fall 2022 without improvement.  Subsequent improvement after resuming [facility] Winter 2022.  With what seemed to be less improvement over the last several months.  Escalated to Breztri follow-up 2023 with worsening symptoms.  Return to Trelegy.  CT high-resolution with emphysema, no other concerning findings.  Suspect his shortness of breath related to asthma/emphysema is being maximally treated currently.  In addition, likely pulmonary hypertension could be contribute.  However given his significant emphysema, lack of ILD, he is a poor candidate for systemic and inhaled pulmonary vasodilators.  Given very high likelihood of worsening decompensation with treatment and unlikely improvement, no further workup, diagnostic test for pulmonary hypertension are recommended.  Asthma: Had improved with ICS/LABA.  Encouraged increased exercise.  No real improvement in symptoms with Trelegy and Breztri.  Back on Breo.   Return in about 6 months (around 09/23/2022).   Lanier Clam,  MD 03/26/2022

## 2022-04-19 ENCOUNTER — Other Ambulatory Visit: Payer: Self-pay | Admitting: Internal Medicine

## 2022-04-19 NOTE — Telephone Encounter (Signed)
Please refill as per office routine med refill policy (all routine meds to be refilled for 3 mo or monthly (per pt preference) up to one year from last visit, then month to month grace period for 3 mo, then further med refills will have to be denied) ? ?

## 2022-04-22 ENCOUNTER — Ambulatory Visit (INDEPENDENT_AMBULATORY_CARE_PROVIDER_SITE_OTHER): Payer: Medicare Other | Admitting: Internal Medicine

## 2022-04-22 ENCOUNTER — Other Ambulatory Visit: Payer: Self-pay | Admitting: Internal Medicine

## 2022-04-22 VITALS — BP 108/60 | HR 73 | Temp 97.6°F | Ht 74.0 in | Wt 193.5 lb

## 2022-04-22 DIAGNOSIS — E559 Vitamin D deficiency, unspecified: Secondary | ICD-10-CM | POA: Diagnosis not present

## 2022-04-22 DIAGNOSIS — J449 Chronic obstructive pulmonary disease, unspecified: Secondary | ICD-10-CM | POA: Diagnosis not present

## 2022-04-22 DIAGNOSIS — E538 Deficiency of other specified B group vitamins: Secondary | ICD-10-CM

## 2022-04-22 DIAGNOSIS — N1832 Chronic kidney disease, stage 3b: Secondary | ICD-10-CM | POA: Diagnosis not present

## 2022-04-22 DIAGNOSIS — J439 Emphysema, unspecified: Secondary | ICD-10-CM | POA: Insufficient documentation

## 2022-04-22 DIAGNOSIS — E1140 Type 2 diabetes mellitus with diabetic neuropathy, unspecified: Secondary | ICD-10-CM

## 2022-04-22 DIAGNOSIS — I10 Essential (primary) hypertension: Secondary | ICD-10-CM | POA: Diagnosis not present

## 2022-04-22 DIAGNOSIS — Z23 Encounter for immunization: Secondary | ICD-10-CM

## 2022-04-22 LAB — HEPATIC FUNCTION PANEL
ALT: 15 U/L (ref 0–53)
AST: 21 U/L (ref 0–37)
Albumin: 4.3 g/dL (ref 3.5–5.2)
Alkaline Phosphatase: 102 U/L (ref 39–117)
Bilirubin, Direct: 0.1 mg/dL (ref 0.0–0.3)
Total Bilirubin: 0.6 mg/dL (ref 0.2–1.2)
Total Protein: 7.2 g/dL (ref 6.0–8.3)

## 2022-04-22 LAB — URINALYSIS, ROUTINE W REFLEX MICROSCOPIC
Bilirubin Urine: NEGATIVE
Hgb urine dipstick: NEGATIVE
Ketones, ur: NEGATIVE
Leukocytes,Ua: NEGATIVE
Nitrite: NEGATIVE
RBC / HPF: NONE SEEN (ref 0–?)
Specific Gravity, Urine: 1.015 (ref 1.000–1.030)
Total Protein, Urine: NEGATIVE
Urine Glucose: NEGATIVE
Urobilinogen, UA: 0.2 (ref 0.0–1.0)
WBC, UA: NONE SEEN (ref 0–?)
pH: 6 (ref 5.0–8.0)

## 2022-04-22 LAB — CBC WITH DIFFERENTIAL/PLATELET
Basophils Absolute: 0 10*3/uL (ref 0.0–0.1)
Basophils Relative: 1.1 % (ref 0.0–3.0)
Eosinophils Absolute: 0.1 10*3/uL (ref 0.0–0.7)
Eosinophils Relative: 3.6 % (ref 0.0–5.0)
HCT: 42.4 % (ref 39.0–52.0)
Hemoglobin: 14.1 g/dL (ref 13.0–17.0)
Lymphocytes Relative: 32.7 % (ref 12.0–46.0)
Lymphs Abs: 1.2 10*3/uL (ref 0.7–4.0)
MCHC: 33.2 g/dL (ref 30.0–36.0)
MCV: 91.2 fl (ref 78.0–100.0)
Monocytes Absolute: 0.4 10*3/uL (ref 0.1–1.0)
Monocytes Relative: 10.4 % (ref 3.0–12.0)
Neutro Abs: 1.8 10*3/uL (ref 1.4–7.7)
Neutrophils Relative %: 52.2 % (ref 43.0–77.0)
Platelets: 143 10*3/uL — ABNORMAL LOW (ref 150.0–400.0)
RBC: 4.65 Mil/uL (ref 4.22–5.81)
RDW: 16.3 % — ABNORMAL HIGH (ref 11.5–15.5)
WBC: 3.5 10*3/uL — ABNORMAL LOW (ref 4.0–10.5)

## 2022-04-22 LAB — LIPID PANEL
Cholesterol: 145 mg/dL (ref 0–200)
HDL: 54.7 mg/dL (ref >39.00)
LDL Cholesterol: 78 mg/dL (ref 0–99)
NonHDL: 90.41
Total CHOL/HDL Ratio: 3
Triglycerides: 64 mg/dL (ref 0.0–149.0)
VLDL: 12.8 mg/dL (ref 0.0–40.0)

## 2022-04-22 LAB — BASIC METABOLIC PANEL
BUN: 49 mg/dL — ABNORMAL HIGH (ref 6–23)
CO2: 26 mEq/L (ref 19–32)
Calcium: 10.6 mg/dL — ABNORMAL HIGH (ref 8.4–10.5)
Chloride: 106 mEq/L (ref 96–112)
Creatinine, Ser: 2.01 mg/dL — ABNORMAL HIGH (ref 0.40–1.50)
GFR: 29.64 mL/min — ABNORMAL LOW (ref >60.00)
Glucose, Bld: 162 mg/dL — ABNORMAL HIGH (ref 70–99)
Potassium: 4.1 mEq/L (ref 3.5–5.1)
Sodium: 140 mEq/L (ref 135–145)

## 2022-04-22 LAB — HEMOGLOBIN A1C: Hgb A1c MFr Bld: 7.5 % — ABNORMAL HIGH (ref 4.6–6.5)

## 2022-04-22 LAB — VITAMIN D 25 HYDROXY (VIT D DEFICIENCY, FRACTURES): VITD: 31.59 ng/mL (ref 30.00–100.00)

## 2022-04-22 LAB — MICROALBUMIN / CREATININE URINE RATIO
Creatinine,U: 118.7 mg/dL
Microalb Creat Ratio: 0.8 mg/g (ref 0.0–30.0)
Microalb, Ur: 0.9 mg/dL (ref 0.0–1.9)

## 2022-04-22 LAB — TSH: TSH: 10.65 u[IU]/mL — ABNORMAL HIGH (ref 0.35–5.50)

## 2022-04-22 LAB — VITAMIN B12: Vitamin B-12: 228 pg/mL (ref 211–911)

## 2022-04-22 MED ORDER — LEVOTHYROXINE SODIUM 25 MCG PO TABS: 25.0000 ug | ORAL_TABLET | Freq: Every day | ORAL | 3 refills | Status: DC

## 2022-04-22 NOTE — Progress Notes (Signed)
Patient ID: Nicholas Patton, male   DOB: 03/20/1934, 88 y.o.   MRN: [id number]        Chief Complaint: follow up right knee djd, ckd, copd, b12 deficiency, dm, htn, low vit d       HPI:  Nicholas Patton is a 88 y.o. male here overall doing ok, due for flu shot, has 3 mo worsening right knee pain with intermittent swelling, but no giveaways or falls, has had cortisone in past and states will call sport med for f/u appt with Dr Georgina Snell.  Pt denies chest pain, increased sob or doe, wheezing, orthopnea, PND, increased LE swelling, palpitations, dizziness or syncope.   Pt denies polydipsia, polyuria, or new focal neuro s/s.    Pt denies fever, wt loss, night sweats, loss of appetite, or other constitutional symptoms         Wt Readings from Last 3 Encounters:  04/22/22 193 lb 8 oz (87.8 kg)  03/25/22 194 lb 12.8 oz (88.4 kg)  02/11/22 194 lb 6.4 oz (88.2 kg)   BP Readings from Last 3 Encounters:  04/22/22 108/60  03/25/22 130/68  02/11/22 128/76         Past Medical History:  Diagnosis Date   Anemia    Arthritis    Cataract    REMOVED BILATERAL   Colon polyps    DIABETES MELLITUS, TYPE II    takes Metformin and Glimepiride daily   Diverticulosis    GERD (gastroesophageal reflux disease)    takes Protonix daily   History of colon polyps    HYPERLIPIDEMIA    takes Atorvastatin daily   HYPERTENSION    takes Amlodipine and Hyzaar daily   Ileus following gastrointestinal surgery ([place]) 07/22/2012   Joint pain    Peripheral neuropathy    Peripheral neuropathy    PERIPHERAL NEUROPATHY, FEET 10/06/2007   Renal disorder    VITAMIN B12 DEFICIENCY 09/06/2008   VITAMIN D DEFICIENCY 01/16/2010   takes Vitamin D daily   Past Surgical History:  Procedure Laterality Date   CATARACT EXTRACTION     both eyes   CHOLECYSTECTOMY N/A 07/18/2012   Procedure: LAPAROSCOPIC CHOLECYSTECTOMY WITH INTRAOPERATIVE CHOLANGIOGRAM;  Surgeon: Zenovia Jarred, MD;  Location: [facility];  Service: General;  Laterality:  N/A;   COLONOSCOPY     POLYPECTOMY     POSTERIOR CERVICAL FUSION/FORAMINOTOMY N/A 06/27/2015   Procedure: Posterior Cervical Fusion with lateral mass fixation Cervical four to cervical seven;  Surgeon: Eustace Moore, MD;  Location: [facility];  Service: Neurosurgery;  Laterality: N/A;   TOTAL KNEE ARTHROPLASTY  2001   Left   TURP VAPORIZATION      reports that he quit smoking about 38 years ago. His smoking use included cigarettes. He has a 31.00 pack-year smoking history. He has never been exposed to tobacco smoke. He has never used smokeless tobacco. He reports that he does not drink alcohol and does not use drugs. family history includes Diabetes Mellitus II in his mother; Lung cancer in his brother. Allergies  Allergen Reactions   Amlodipine Swelling   Cymbalta [Duloxetine Hcl] Other (See Comments)   Oxycodone Other (See Comments)   Current Outpatient Medications on File Prior to Visit  Medication Sig Dispense Refill   albuterol (VENTOLIN HFA) 108 (90 Base) MCG/ACT inhaler INHALE 2 PUFFS BY MOUTH EVERY 6 HOURS AS NEEDED FOR WHEEZING FOR SHORTNESS OF BREATH 18 g 3   allopurinol (ZYLOPRIM) 100 MG tablet Take 1 tablet (100 mg total) by  mouth daily. 90 tablet 1   aspirin 81 MG EC tablet Take 81 mg by mouth daily.     atorvastatin (LIPITOR) 40 MG tablet Take 1 tablet (40 mg total) by mouth daily. 90 tablet 3   carvedilol (COREG) 12.5 MG tablet TAKE 1 TABLET BY MOUTH  TWICE DAILY WITH MEALS 180 tablet 3   doxazosin (CARDURA) 2 MG tablet Take 1 tablet (2 mg total) by mouth daily. 90 tablet 3   finasteride (PROSCAR) 5 MG tablet Take 5 mg by mouth daily.     fluticasone furoate-vilanterol (BREO ELLIPTA) 200-25 MCG/ACT AEPB Inhale 1 puff into the lungs daily. 180 each 3   furosemide (LASIX) 20 MG tablet Take 20 mg by mouth daily.     glimepiride (AMARYL) 2 MG tablet TAKE 2 TABLETS BY MOUTH DAILY  BEFORE BREAKFAST 180 tablet 3   losartan (COZAAR) 100 MG tablet TAKE 1 TABLET BY MOUTH DAILY 90  tablet 3   Multiple Vitamins-Minerals (MULTIVITAMIN PO) Take 1 tablet by mouth daily. (Patient not taking: Reported on 04/22/2022)     pantoprazole (PROTONIX) 40 MG tablet Take 1 tablet (40 mg total) by mouth daily. 90 tablet 3   PAXIL 10 MG tablet Take 10 mg by mouth daily.     pioglitazone (ACTOS) 15 MG tablet TAKE 1 TABLET BY MOUTH DAILY 90 tablet 2   predniSONE (DELTASONE) 20 MG tablet Take 1 tablet (20 mg total) by mouth daily with breakfast. (Patient not taking: Reported on 04/22/2022) 5 tablet 0   tamsulosin (FLOMAX) 0.4 MG CAPS capsule Take 1 capsule (0.4 mg total) by mouth daily. 90 capsule 3   No current facility-administered medications on file prior to visit.        ROS:  All others reviewed and negative.  Objective        PE:  BP 108/60   Pulse 73   Temp 97.6 F (36.4 C) (Temporal)   Ht 6' 2"$  (1.88 m)   Wt 193 lb 8 oz (87.8 kg)   SpO2 91%   BMI 24.84 kg/m                 Constitutional: Pt appears in NAD               HENT: Head: NCAT.                Right Ear: External ear normal.                 Left Ear: External ear normal.                Eyes: . Pupils are equal, round, and reactive to light. Conjunctivae and EOM are normal               Nose: without d/c or deformity               Neck: Neck supple. Gross normal ROM               Cardiovascular: Normal rate and regular rhythm.                 Pulmonary/Chest: Effort normal and breath sounds without rales or wheezing.                Abd:  Soft, NT, ND, + BS, no organomegaly               Neurological: Pt is alert. At baseline orientation, motor grossly intact  Skin: Skin is warm. No rashes, no other new lesions, LE edema - none               Psychiatric: Pt behavior is normal without agitation   Micro: none  Cardiac tracings I have personally interpreted today:  none  Pertinent Radiological findings (summarize): none   Lab Results  Component Value Date   WBC 3.5 (L) 04/22/2022   HGB 14.1  04/22/2022   HCT 42.4 04/22/2022   PLT 143.0 (L) 04/22/2022   GLUCOSE 162 (H) 04/22/2022   CHOL 145 04/22/2022   TRIG 64.0 04/22/2022   HDL 54.70 04/22/2022   LDLCALC 78 04/22/2022   ALT 15 04/22/2022   AST 21 04/22/2022   NA 140 04/22/2022   K 4.1 04/22/2022   CL 106 04/22/2022   CREATININE 2.01 (H) 04/22/2022   BUN 49 (H) 04/22/2022   CO2 26 04/22/2022   TSH 10.65 (H) 04/22/2022   PSA 4.61 (H) 04/09/2017   INR 1.12 06/19/2015   HGBA1C 7.5 (H) 04/22/2022   MICROALBUR 0.9 04/22/2022   Assessment/Plan:  Nicholas Patton is a 88 y.o. Black or African American [2] male with  has a past medical history of Anemia, Arthritis, Cataract, Colon polyps, DIABETES MELLITUS, TYPE II, Diverticulosis, GERD (gastroesophageal reflux disease), History of colon polyps, HYPERLIPIDEMIA, HYPERTENSION, Ileus following gastrointestinal surgery ([place]) (07/22/2012), Joint pain, Peripheral neuropathy, Peripheral neuropathy, PERIPHERAL NEUROPATHY, FEET (10/06/2007), Renal disorder, VITAMIN B12 DEFICIENCY (09/06/2008), and VITAMIN D DEFICIENCY (01/16/2010).  COPD (chronic obstructive pulmonary disease) (HCC) Stable overall, pt to continue inhaler prn  B12 deficiency Lab Results  Component Value Date   VITAMINB12 228 04/22/2022   Low, pt to start oral replacement - b12 1000 mcg qd   CKD (chronic kidney disease) stage 3, GFR 30-59 ml/min Lab Results  Component Value Date   CREATININE 2.01 (H) 04/22/2022   Stable overall, cont to avoid nephrotoxins  Diabetes Lab Results  Component Value Date   HGBA1C 7.5 (H) 04/22/2022   Uncontrolled but stable for age, pt to continue current medical treatment amaryl 2 mg qd, actos 15 mg qd   Essential hypertension BP Readings from Last 3 Encounters:  04/22/22 108/60  03/25/22 130/68  02/11/22 128/76   Stable, pt to continue medical treatment losartan 100 mg qd, coreg 12.5 bid   Vitamin D deficiency Last vitamin D Lab Results  Component Value Date   VD25OH  31.59 04/22/2022   Low, to start oral replacement  Followup: Return in about 6 months (around 10/21/2022).  Cathlean Cower, MD 04/25/2022 1:55 PM [facility]

## 2022-04-22 NOTE — Patient Instructions (Signed)
You had the flu shot today  Please continue all other medications as before, and refills have been done if requested.  Please have the pharmacy call with any other refills you may need.  Please continue your efforts at being more active, low cholesterol diet, and weight control.  You are otherwise up to date with prevention measures today.  Please keep your appointments with your specialists as you may have planned  Please go to the LAB at the blood drawing area for the tests to be done  You will be contacted by phone if any changes need to be made immediately.  Otherwise, you will receive a letter about your results with an explanation, but please check with MyChart first.  Please make an Appointment to return in 6 months, or sooner if needed

## 2022-04-25 ENCOUNTER — Encounter: Payer: Self-pay | Admitting: Internal Medicine

## 2022-04-25 NOTE — Assessment & Plan Note (Signed)
Stable overall, pt to continue inhaler prn

## 2022-04-25 NOTE — Assessment & Plan Note (Signed)
Lab Results  Component Value Date   CREATININE 2.01 (H) 04/22/2022   Stable overall, cont to avoid nephrotoxins

## 2022-04-25 NOTE — Assessment & Plan Note (Signed)
Last vitamin D Lab Results  Component Value Date   VD25OH 31.59 04/22/2022   Low, to start oral replacement

## 2022-04-25 NOTE — Addendum Note (Signed)
Addended by: [name] on: 04/25/2022 01:56 PM   Modules accepted: Level of Service

## 2022-04-25 NOTE — Assessment & Plan Note (Signed)
Lab Results  Component Value Date   VITAMINB12 228 04/22/2022   Low, pt to start oral replacement - b12 1000 mcg qd

## 2022-04-25 NOTE — Assessment & Plan Note (Signed)
BP Readings from Last 3 Encounters:  04/22/22 108/60  03/25/22 130/68  02/11/22 128/76   Stable, pt to continue medical treatment losartan 100 mg qd, coreg 12.5 bid

## 2022-04-25 NOTE — Assessment & Plan Note (Signed)
Lab Results  Component Value Date   HGBA1C 7.5 (H) 04/22/2022   Uncontrolled but stable for age, pt to continue current medical treatment amaryl 2 mg qd, actos 15 mg qd

## 2022-05-11 ENCOUNTER — Encounter: Payer: Self-pay | Admitting: Family Medicine

## 2022-05-11 ENCOUNTER — Ambulatory Visit (INDEPENDENT_AMBULATORY_CARE_PROVIDER_SITE_OTHER): Payer: Medicare Other | Admitting: Family Medicine

## 2022-05-11 ENCOUNTER — Ambulatory Visit (INDEPENDENT_AMBULATORY_CARE_PROVIDER_SITE_OTHER): Payer: Medicare Other

## 2022-05-11 VITALS — BP 150/72 | HR 55 | Ht 74.0 in | Wt 196.0 lb

## 2022-05-11 DIAGNOSIS — M542 Cervicalgia: Secondary | ICD-10-CM

## 2022-05-11 DIAGNOSIS — M50322 Other cervical disc degeneration at C5-C6 level: Secondary | ICD-10-CM | POA: Diagnosis not present

## 2022-05-11 MED ORDER — TIZANIDINE HCL 2 MG PO TABS: 2.0000 mg | ORAL_TABLET | Freq: Every evening | ORAL | 1 refills | Status: DC | PRN

## 2022-05-11 NOTE — Progress Notes (Signed)
   I, Josepha Pigg, CMA acting as a scribe for Lynne Leader, MD.  Nicholas Patton is a 88 y.o. male who presents to [place] at [facility] today for neck pain. Pt was previously seen by Dr. Georgina Snell on 01/08/22 for chronic R knee pain.  Today, pt c/o neck pain x 1 month, worsening since onset. Pt locates pain to bilateral aspect of the neck. Hx of neck surgery. Sx radiating into the LEFT arm/shoulder. Intermittent n/t. Denies weakness, decreased grip strength. Notes mild HA x 3 days. Mechanical sx present. No recent diagnostic imaging. Sx causing night disturbance.   Radiates: L UE Numbness/tingling: mild UE Weakness: no Aggravates: looking to the left Treatments tried: Tylenol with minimal relief. Short-term relief with heat.    Pertinent review of systems: No fevers or chills  Relevant historical information: COPD.  Diabetes.  MGUS History of cervical fusion 2017.  Exam:  BP (!) 150/72   Pulse (!) 55   Ht '6\' 2"'$  (1.88 m)   Wt 196 lb (88.9 kg)   SpO2 94%   BMI 25.16 kg/m  General: Well Developed, well nourished, and in no acute distress.   MSK: C-spine Normal appearing Nontender palpation cervical midline.  Tender palpation cervical paraspinal musculature on the left. Decreased cervical motion. Upper extremity strength and reflexes are intact distally.   Lab and Radiology Results  X-ray images C-spine obtained today personally and independently interpreted Posterior fusion at C4-C7.  Intact hardware.  No acute fractures are visible.  No aggressive appearing bony lesions are visible per my read. Await formal radiology review     Assessment and Plan: 88 y.o. male with left neck pain.  This is an acute exacerbation of a chronic problem.  The left-sided neck pain ongoing for the last few weeks is thought to be muscle spasm and dysfunction.  Plan to use low-dose tizanidine at bedtime.  Plan to refer to physical therapy.  Recommend also heating pad.  We  talked about medication safety. Recheck in 6 weeks.  PDMP not reviewed this encounter. Orders Placed This Encounter  Procedures   DG Cervical Spine 2 or 3 views    Standing Status:   Future    Number of Occurrences:   1    Standing Expiration Date:   05/11/2023    Order Specific Question:   Reason for Exam (SYMPTOM  OR DIAGNOSIS REQUIRED)    Answer:   eval neck pain    Order Specific Question:   Preferred imaging location?    Answer:   [facility]   Ambulatory referral to Physical Therapy    Referral Priority:   Routine    Referral Type:   Physical Medicine    Referral Reason:   Specialty Services Required    Requested Specialty:   Physical Therapy   Meds ordered this encounter  Medications   tiZANidine (ZANAFLEX) 2 MG tablet    Sig: Take 1-2 tablets (2-4 mg total) by mouth at bedtime as needed for muscle spasms.    Dispense:  60 tablet    Refill:  1     Discussed warning signs or symptoms. Please see discharge instructions. Patient expresses understanding.   The above documentation has been reviewed and is accurate and complete Lynne Leader, M.D.

## 2022-05-11 NOTE — Patient Instructions (Addendum)
Thank you for coming in today.   Please get an Xray today before you leave   Use the tizanidine mostly at bedtime for neck pain. It can make you sleepy.   Use a heating pad.   Physical therapy could help a lot.   I've referred you to Physical Therapy.  Let us know if you don't hear from them in one week.   Recheck in 6 weeks.

## 2022-05-12 NOTE — Progress Notes (Signed)
Cervical spine x-ray shows evidence of a fusion at C4-C7.  Some arthritis changes are present in the neck.  No fractures are visible.

## 2022-05-25 ENCOUNTER — Encounter: Payer: Self-pay | Admitting: Podiatry

## 2022-05-25 ENCOUNTER — Ambulatory Visit (INDEPENDENT_AMBULATORY_CARE_PROVIDER_SITE_OTHER): Payer: Medicare Other | Admitting: Podiatry

## 2022-05-25 VITALS — BP 152/64

## 2022-05-25 DIAGNOSIS — B351 Tinea unguium: Secondary | ICD-10-CM | POA: Diagnosis not present

## 2022-05-25 DIAGNOSIS — M79675 Pain in left toe(s): Secondary | ICD-10-CM

## 2022-05-25 DIAGNOSIS — L84 Corns and callosities: Secondary | ICD-10-CM | POA: Diagnosis not present

## 2022-05-25 DIAGNOSIS — E1142 Type 2 diabetes mellitus with diabetic polyneuropathy: Secondary | ICD-10-CM | POA: Diagnosis not present

## 2022-05-25 DIAGNOSIS — M79674 Pain in right toe(s): Secondary | ICD-10-CM | POA: Diagnosis not present

## 2022-05-25 NOTE — Progress Notes (Signed)
  Subjective:  Patient ID: Nicholas Patton, male    DOB: 1934/12/15,  MRN: [id number]  Nicholas Patton presents to clinic today for at risk foot care with history of diabetic neuropathy and corn(s) left great toe and left second digit, callus(es) right lower extremity and painful mycotic nails.  Pain interferes with ambulation. Aggravating factors include wearing enclosed shoe gear. Painful toenails interfere with ambulation. Aggravating factors include wearing enclosed shoe gear. Pain is relieved with periodic professional debridement. Painful corns and calluses are aggravated when weightbearing with and without shoegear. Pain is relieved with periodic professional debridement.  Chief Complaint  Patient presents with   Nail Problem    [facility] [id number] A1C-7.1 PCP-[place] PCP VST-03/2022   New problem(s): None.   PCP is [name], MD.  Allergies  Allergen Reactions   Amlodipine Swelling   Cymbalta [Duloxetine Hcl] Other (See Comments)   Oxycodone Other (See Comments)    Review of Systems: Negative except as noted in the HPI.  Objective: No changes noted in today's physical examination. Vitals:   05/25/22 0902  BP: (!) 152/64   Nicholas Patton is a pleasant 88 y.o. male WD, WN in NAD. AAO x 3.  Neurovascular Examination: Capillary refill time to digits immediate b/l. Palpable DP pulse(s) b/l LE. Palpable PT pulse(s) b/l LE. Pedal hair absent. Nonpitting edema noted right lower extremity.  Pt has subjective symptoms of neuropathy. Protective sensation intact 5/5 intact bilaterally with 10g monofilament b/l. Vibratory sensation intact b/l.  Dermatological:  Pedal skin is warm and supple b/l LE. No open wounds b/l LE. No interdigital macerations noted b/l LE. Toenails 1-5 b/l elongated, discolored, dystrophic, thickened, crumbly with subungual debris and tenderness to dorsal palpation.   Hyperkeratotic lesion(s) lateral IPJ of L hallux, medial PIPJ of L 2nd toe. Resolved 1st  metatarsal head right foot. No erythema, no edema, no drainage, no fluctuance.  Musculoskeletal:  Muscle strength 5/5 to all lower extremity muscle groups bilaterally. No pain, crepitus or joint limitation noted with ROM bilateral LE. HAV with bunion deformity noted b/l LE. Hammertoe deformity noted 2-5 b/l.  Assessment/Plan: 1. Pain due to onychomycosis of toenails of both feet   2. Corns   3. Diabetic peripheral neuropathy associated with type 2 diabetes mellitus ([place])    -Patient was evaluated and treated. All patient's and/or POA's questions/concerns answered on today's visit. -Patient to continue soft, supportive shoe gear daily. -Mycotic toenails 1-5 bilaterally were debrided in length and girth with sterile nail nippers and dremel without incident. -Corn(s) L hallux and L 2nd toe pared utilizing sharp debridement with sterile blade without complication or incident. Total number debrided=2. -Dispensed tube foam. Apply to L hallux every morning. Remove every evening. -Patient/POA to call should there be question/concern in the interim.   Return in about 3 months (around 08/25/2022).  Marzetta Board, DPM

## 2022-05-26 DIAGNOSIS — H11823 Conjunctivochalasis, bilateral: Secondary | ICD-10-CM | POA: Diagnosis not present

## 2022-05-26 DIAGNOSIS — H40013 Open angle with borderline findings, low risk, bilateral: Secondary | ICD-10-CM | POA: Diagnosis not present

## 2022-05-26 DIAGNOSIS — Z961 Presence of intraocular lens: Secondary | ICD-10-CM | POA: Diagnosis not present

## 2022-05-26 DIAGNOSIS — H43823 Vitreomacular adhesion, bilateral: Secondary | ICD-10-CM | POA: Diagnosis not present

## 2022-05-26 DIAGNOSIS — E119 Type 2 diabetes mellitus without complications: Secondary | ICD-10-CM | POA: Diagnosis not present

## 2022-05-26 DIAGNOSIS — H4322 Crystalline deposits in vitreous body, left eye: Secondary | ICD-10-CM | POA: Diagnosis not present

## 2022-05-26 LAB — HM DIABETES EYE EXAM

## 2022-05-27 ENCOUNTER — Encounter: Payer: Self-pay | Admitting: Physical Therapy

## 2022-05-27 ENCOUNTER — Ambulatory Visit: Payer: Medicare Other | Attending: Family Medicine | Admitting: Physical Therapy

## 2022-05-27 ENCOUNTER — Other Ambulatory Visit: Payer: Self-pay

## 2022-05-27 DIAGNOSIS — M6281 Muscle weakness (generalized): Secondary | ICD-10-CM | POA: Diagnosis not present

## 2022-05-27 DIAGNOSIS — R293 Abnormal posture: Secondary | ICD-10-CM | POA: Diagnosis not present

## 2022-05-27 DIAGNOSIS — M542 Cervicalgia: Secondary | ICD-10-CM | POA: Diagnosis not present

## 2022-05-27 NOTE — Therapy (Signed)
OUTPATIENT PHYSICAL THERAPY EVALUATION   Patient Name: Nicholas Patton MRN: [id number] DOB:1934-09-10, 88 y.o., male Today's Date: 05/27/2022   END OF SESSION:  PT End of Session - 05/27/22 0907     Visit Number 1    Number of Visits 9    Date for PT Re-Evaluation 07/22/22    Authorization Type MCR    Progress Note Due on Visit 10    PT Start Time 0915    PT Stop Time 1000    PT Time Calculation (min) 45 min    Activity Tolerance Patient tolerated treatment well    Behavior During Therapy WFL for tasks assessed/performed             Past Medical History:  Diagnosis Date   Anemia    Arthritis    Cataract    REMOVED BILATERAL   Colon polyps    DIABETES MELLITUS, TYPE II    takes Metformin and Glimepiride daily   Diverticulosis    GERD (gastroesophageal reflux disease)    takes Protonix daily   History of colon polyps    HYPERLIPIDEMIA    takes Atorvastatin daily   HYPERTENSION    takes Amlodipine and Hyzaar daily   Ileus following gastrointestinal surgery ([place]) 07/22/2012   Joint pain    Peripheral neuropathy    Peripheral neuropathy    PERIPHERAL NEUROPATHY, FEET 10/06/2007   Renal disorder    VITAMIN B12 DEFICIENCY 09/06/2008   VITAMIN D DEFICIENCY 01/16/2010   takes Vitamin D daily   Past Surgical History:  Procedure Laterality Date   CATARACT EXTRACTION     both eyes   CHOLECYSTECTOMY N/A 07/18/2012   Procedure: LAPAROSCOPIC CHOLECYSTECTOMY WITH INTRAOPERATIVE CHOLANGIOGRAM;  Surgeon: Zenovia Jarred, MD;  Location: [facility];  Service: General;  Laterality: N/A;   COLONOSCOPY     POLYPECTOMY     POSTERIOR CERVICAL FUSION/FORAMINOTOMY N/A 06/27/2015   Procedure: Posterior Cervical Fusion with lateral mass fixation Cervical four to cervical seven;  Surgeon: Eustace Moore, MD;  Location: [place];  Service: Neurosurgery;  Laterality: N/A;   TOTAL KNEE ARTHROPLASTY  2001   Left   TURP VAPORIZATION     Patient Active Problem List   Diagnosis Date Noted    COPD (chronic obstructive pulmonary disease) ([place]) 04/22/2022   Acute asthmatic bronchitis 12/02/2021   Bilateral ankle pain 04/21/2021   Acute cough 01/19/2021   Pain and swelling of lower leg 11/24/2020   Scrotal itching 04/06/2020   Aortic atherosclerosis ([place]) 04/05/2020   Gross hematuria 04/05/2020   Pruritus 02/04/2020   Right ankle pain 02/04/2020   Rash 10/08/2019   Asthma 05/19/2019   Abnormal TSH 10/04/2018   Headache 10/04/2018   PHN (postherpetic neuralgia) 04/04/2018   Shingles 03/11/2018   Acute gout due to renal impairment involving toe of left foot 02/02/2018   Eustachian tube dysfunction, left 12/06/2017   MGUS (monoclonal gammopathy of unknown significance) 10/28/2017   Elevated blood protein 10/08/2017   Peripheral edema 09/17/2017   Anemia, iron deficiency 04/10/2016   Left-sided nosebleed 01/13/2016   Hematochezia 11/21/2015   Peripheral neuropathic pain 10/25/2015   Low back pain 10/08/2015   CKD (chronic kidney disease) stage 3, GFR 30-59 ml/min (HCC) 10/08/2015   S/P cervical spinal fusion 06/27/2015   Increased prostate specific antigen (PSA) velocity 04/09/2015   Chest pain 08/14/2014   Dyspnea 08/14/2014   Urinary frequency 06/19/2014   Overactive bladder 09/27/2013   Onychomycosis 08/08/2013   Pain in lower limb 05/15/2013  Diabetic peripheral neuropathy ([place]) 05/15/2013   Other hammer toe (acquired) 05/15/2013   HAV (hallux abducto valgus) 05/15/2013   Blood in mouth of unknown source 02/09/2013   Vertigo 12/29/2012   Acute upper respiratory infection 12/29/2012   Erectile dysfunction 09/22/2012   Left ear hearing loss 09/13/2012   S/P laparoscopic cholecystectomy 08/03/2012   Neck pain on left side 04/04/2012   Trapezoid ligament sprain 04/04/2012   Bladder neck obstruction 10/06/2011   Cervical radiculitis 09/26/2010   Preventative health care 09/14/2010   Vitamin D deficiency 01/16/2010   PERS HX TOBACCO USE PRESENTING HAZARDS HEALTH  09/17/2009   B12 deficiency 09/06/2008   INSOMNIA 06/21/2008   LEG PAIN, BILATERAL 10/06/2007   Diabetes ([place]) 01/24/2007   Hyperlipidemia 01/24/2007   Allergic rhinitis 01/24/2007   BPH (benign prostatic hypertrophy) 01/24/2007   Essential hypertension 10/05/2006   GERD 10/05/2006    PCP: [name], MD  REFERRING PROVIDER: Gregor Hams, MD  REFERRING DIAG: Neck pain  THERAPY DIAG:  Cervicalgia  Abnormal posture  Muscle weakness (generalized)  Rationale for Evaluation and Treatment: Rehabilitation  ONSET DATE: Ongoing since 2017   SUBJECTIVE:                                                                                                                                                                                                        SUBJECTIVE STATEMENT: Patient reports about 2017 he had a fusion in his neck and states it has never been right since then. His pain on both sides of the lower neck, but mainly on the left side. States that turning his head will aggravate his pain and he can't move his neck much. He reports that neck pops a lot when he turns its. He has trouble sleeping because of the neck pain. He also notes a left shoulder issue and was told in the past he needs a shoulder replacement, but otherwise does not have any pain or numbness/tingling in the arms. He does have some rubber bands that he works with at home and it seems to ease the shoulder but not his neck pain.  PERTINENT HISTORY:  C4-C7 fusion 2017, see PMH above  PAIN:  Are you having pain? Yes:  NPRS scale: 7/10 (when turning neck to left 10/10) Pain location: Neck Pain description: Aching, dull, sharp Aggravating factors: Neck movement, sleeping or getting comfortable Relieving factors: Rest  PRECAUTIONS: None  WEIGHT BEARING RESTRICTIONS: No  FALLS:  Has patient fallen in last 6 months? No  PLOF: Independent  PATIENT GOALS: Pain relief  OBJECTIVE:  DIAGNOSTIC FINDINGS:   X-ray cervical 05/11/2022 IMPRESSION: 1. Posterior fusion hardware at C4, C5, C6 and C7 similar to prior study. 2. Progression of degenerative disc disease at C5-C6, C6-C7 and C7-T1.  PATIENT SURVEYS:  FOTO 56% functional status  COGNITION: Overall cognitive status: Within functional limits for tasks assessed  SENSATION: WFL  POSTURE:   Rounded shoulder and forward head posture  PALPATION: Tender to palpation bilateral upper trap and levator stretch   CERVICAL ROM:   Active ROM A/PROM (deg) eval  Flexion 35  Extension 15  Right lateral flexion 10  Left lateral flexion 10  Right rotation 30  Left rotation 20   (Blank rows = not tested)  UPPER EXTREMITY ROM:   Able to reach overhead and behind back but reports left shoulder pain  UPPER EXTREMITY MMT:  MMT Right eval Left eval  Shoulder flexion 4+ 4-  Shoulder extension    Shoulder abduction    Shoulder internal rotation    Shoulder external rotation 4+ 4-  Middle trapezius 3 3  Lower trapezius    Elbow flexion 5 5  Elbow extension 5 5  Wrist flexion    Wrist extension    Wrist ulnar deviation    Wrist radial deviation    Wrist pronation    Wrist supination    Grip strength     (Blank rows = not tested)  CERVICAL SPECIAL TESTS:  Not assessed  FUNCTIONAL TESTS:  Not assessed   TODAY'S TREATMENT: [facility] Adult PT Treatment:                                                DATE: 05/27/2022 Therapeutic Exercise: Doorway pec stretch 3 x 20 sec Row with green 2 x 10 Cervical rotation SNAG x 10 each  PATIENT EDUCATION:  Education details: Exam findings, POC, HEP Person educated: Patient Education method: Explanation, Demonstration, Tactile cues, Verbal cues, and Handouts Education comprehension: verbalized understanding, returned demonstration, verbal cues required, tactile cues required, and needs further education  HOME EXERCISE PROGRAM: Access Code: [email or web address]    ASSESSMENT: CLINICAL  IMPRESSION: Patient is a 89 y.o. male who was seen today for physical therapy evaluation and treatment for chronic neck pain with history of C4-C7 fusion in 2017. Currently he does demonstrate significant limitations with cervical motion and increased tension of upper trap and levator musculature. He also demonstrates postural deviations and limitations with periscapular strength. Patient also likely has left rotator cuff tear that may impact progress in therapy.    OBJECTIVE IMPAIRMENTS: decreased activity tolerance, decreased ROM, decreased strength, impaired flexibility, postural dysfunction, and pain.   ACTIVITY LIMITATIONS: lifting, bending, sleeping, dressing, reach over head, and hygiene/grooming  PARTICIPATION LIMITATIONS: meal prep, cleaning, and driving  PERSONAL FACTORS: Age, Fitness, Past/current experiences, and Time since onset of injury/illness/exacerbation are also affecting patient's functional outcome.   REHAB POTENTIAL: Good  CLINICAL DECISION MAKING: Stable/uncomplicated  EVALUATION COMPLEXITY: Low   GOALS: Goals reviewed with patient? Yes  SHORT TERM GOALS: Target date: 06/24/2022  Patient will be I with initial HEP in order to progress with therapy. Baseline: HEP provided at eval Goal status: INITIAL  2.  PT will review FOTO with patient by 3rd visit in order to understand expected progress and outcome with therapy. Baseline: FOTO assessed at eval Goal status: INITIAL  3.  Patient will report pain with turning  neck </= 5/10 in order to improve driving and reduce functional limitations Baseline: 10/10 pain with turning neck Goal status: INITIAL  LONG TERM GOALS: Target date: 07/22/2022  Patient will be I with final HEP to maintain progress from PT. Baseline: HEP provided at eval Goal status: INITIAL  2.  Patient will report >/= 60% status on FOTO to indicate improved functional ability. Baseline: 56% functional status Goal status: INITIAL  3.  Patient  will demonstrate cervical rotation >/= 45 deg in order to improve driving ability Baseline: see limitations noted above Goal status: INITIAL  4.  Patient will demonstrate periscapular strength >/= 4/5 MMT in order to improve postural control Baseline: 4-/5 MMT Goal status: INITIAL   PLAN: PT FREQUENCY: 1x/week  PT DURATION: 8 weeks  PLANNED INTERVENTIONS: Therapeutic exercises, Therapeutic activity, Neuromuscular re-education, Balance training, Gait training, Patient/Family education, Self Care, Joint mobilization, Aquatic Therapy, Dry Needling, Cryotherapy, Moist heat, Manual therapy, and Re-evaluation  PLAN FOR NEXT SESSION: Review HEP and progress PRN, manual/dry needling for upper trap region, cervical mobility and progress postural strengthening and endurance   Hilda Blades, PT, DPT, LAT, ATC 05/27/22  10:22 AM Phone: [phone] Fax: [phone]

## 2022-05-27 NOTE — Patient Instructions (Signed)
Access Code: [email or web address] URL: [email or web address] Date: 05/27/2022 Prepared by: Hilda Blades  Exercises - Doorway Pec Stretch at 90 Degrees Abduction  - 2 x daily - 3 reps - 20 seconds hold - Standing Row with Anchored Resistance  - 2 x daily - 20 reps - Seated Assisted Cervical Rotation with Towel  - 2 x daily - 10 reps

## 2022-06-01 ENCOUNTER — Encounter: Payer: Self-pay | Admitting: Physical Therapy

## 2022-06-01 ENCOUNTER — Ambulatory Visit: Payer: Medicare Other | Admitting: Physical Therapy

## 2022-06-01 DIAGNOSIS — M6281 Muscle weakness (generalized): Secondary | ICD-10-CM

## 2022-06-01 DIAGNOSIS — M542 Cervicalgia: Secondary | ICD-10-CM | POA: Diagnosis not present

## 2022-06-01 DIAGNOSIS — R293 Abnormal posture: Secondary | ICD-10-CM

## 2022-06-01 NOTE — Therapy (Signed)
OUTPATIENT PHYSICAL THERAPY TREATMENT NOTE   Patient Name: Nicholas Patton MRN: [id number] DOB:Oct 24, 1934, 88 y.o., male 40 Date: 06/01/2022  PCP: [name], MD   REFERRING PROVIDER: Gregor Hams, MD  END OF SESSION:   PT End of Session - 06/01/22 0836     Visit Number 2    Number of Visits 9    Date for PT Re-Evaluation 07/22/22    Authorization Type MCR    PT Start Time 0845    PT Stop Time 0935    PT Time Calculation (min) 50 min             Past Medical History:  Diagnosis Date   Anemia    Arthritis    Cataract    REMOVED BILATERAL   Colon polyps    DIABETES MELLITUS, TYPE II    takes Metformin and Glimepiride daily   Diverticulosis    GERD (gastroesophageal reflux disease)    takes Protonix daily   History of colon polyps    HYPERLIPIDEMIA    takes Atorvastatin daily   HYPERTENSION    takes Amlodipine and Hyzaar daily   Ileus following gastrointestinal surgery ([place]) 07/22/2012   Joint pain    Peripheral neuropathy    Peripheral neuropathy    PERIPHERAL NEUROPATHY, FEET 10/06/2007   Renal disorder    VITAMIN B12 DEFICIENCY 09/06/2008   VITAMIN D DEFICIENCY 01/16/2010   takes Vitamin D daily   Past Surgical History:  Procedure Laterality Date   CATARACT EXTRACTION     both eyes   CHOLECYSTECTOMY N/A 07/18/2012   Procedure: LAPAROSCOPIC CHOLECYSTECTOMY WITH INTRAOPERATIVE CHOLANGIOGRAM;  Surgeon: Zenovia Jarred, MD;  Location: [facility];  Service: General;  Laterality: N/A;   COLONOSCOPY     POLYPECTOMY     POSTERIOR CERVICAL FUSION/FORAMINOTOMY N/A 06/27/2015   Procedure: Posterior Cervical Fusion with lateral mass fixation Cervical four to cervical seven;  Surgeon: Eustace Moore, MD;  Location: [facility];  Service: Neurosurgery;  Laterality: N/A;   TOTAL KNEE ARTHROPLASTY  2001   Left   TURP VAPORIZATION     Patient Active Problem List   Diagnosis Date Noted   COPD (chronic obstructive pulmonary disease) ([place]) 04/22/2022   Acute  asthmatic bronchitis 12/02/2021   Bilateral ankle pain 04/21/2021   Acute cough 01/19/2021   Pain and swelling of lower leg 11/24/2020   Scrotal itching 04/06/2020   Aortic atherosclerosis ([place]) 04/05/2020   Gross hematuria 04/05/2020   Pruritus 02/04/2020   Right ankle pain 02/04/2020   Rash 10/08/2019   Asthma 05/19/2019   Abnormal TSH 10/04/2018   Headache 10/04/2018   PHN (postherpetic neuralgia) 04/04/2018   Shingles 03/11/2018   Acute gout due to renal impairment involving toe of left foot 02/02/2018   Eustachian tube dysfunction, left 12/06/2017   MGUS (monoclonal gammopathy of unknown significance) 10/28/2017   Elevated blood protein 10/08/2017   Peripheral edema 09/17/2017   Anemia, iron deficiency 04/10/2016   Left-sided nosebleed 01/13/2016   Hematochezia 11/21/2015   Peripheral neuropathic pain 10/25/2015   Low back pain 10/08/2015   CKD (chronic kidney disease) stage 3, GFR 30-59 ml/min ([facility]) 10/08/2015   S/P cervical spinal fusion 06/27/2015   Increased prostate specific antigen (PSA) velocity 04/09/2015   Chest pain 08/14/2014   Dyspnea 08/14/2014   Urinary frequency 06/19/2014   Overactive bladder 09/27/2013   Onychomycosis 08/08/2013   Pain in lower limb 05/15/2013   Diabetic peripheral neuropathy ([place]) 05/15/2013   Other hammer toe (acquired)  05/15/2013   HAV (hallux abducto valgus) 05/15/2013   Blood in mouth of unknown source 02/09/2013   Vertigo 12/29/2012   Acute upper respiratory infection 12/29/2012   Erectile dysfunction 09/22/2012   Left ear hearing loss 09/13/2012   S/P laparoscopic cholecystectomy 08/03/2012   Neck pain on left side 04/04/2012   Trapezoid ligament sprain 04/04/2012   Bladder neck obstruction 10/06/2011   Cervical radiculitis 09/26/2010   Preventative health care 09/14/2010   Vitamin D deficiency 01/16/2010   PERS HX TOBACCO USE PRESENTING HAZARDS HEALTH 09/17/2009   B12 deficiency 09/06/2008   INSOMNIA 06/21/2008   LEG  PAIN, BILATERAL 10/06/2007   Diabetes ([place]) 01/24/2007   Hyperlipidemia 01/24/2007   Allergic rhinitis 01/24/2007   BPH (benign prostatic hypertrophy) 01/24/2007   Essential hypertension 10/05/2006   GERD 10/05/2006    REFERRING DIAG: Neck pain  THERAPY DIAG:  Cervicalgia  Abnormal posture  Muscle weakness (generalized)  Rationale for Evaluation and Treatment Rehabilitation  PERTINENT HISTORY:  C4-C7 fusion 2017, see PMH above    PRECAUTIONS: none  SUBJECTIVE:                                                                                                                                                                                      SUBJECTIVE STATEMENT:  The exercises were fair.    PAIN:  Are you having pain? Yes:  NPRS scale: 7/10  Pain location: Neck Pain description: Aching, dull, sharp Aggravating factors: Neck movement, sleeping or getting comfortable Relieving factors: Rest   OBJECTIVE: (objective measures completed at initial evaluation unless otherwise dated)   DIAGNOSTIC FINDINGS:  X-ray cervical 05/11/2022 IMPRESSION: 1. Posterior fusion hardware at C4, C5, C6 and C7 similar to prior study. 2. Progression of degenerative disc disease at C5-C6, C6-C7 and C7-T1.   PATIENT SURVEYS:  FOTO 56% functional status   COGNITION: Overall cognitive status: Within functional limits for tasks assessed   SENSATION: WFL   POSTURE:             Rounded shoulder and forward head posture   PALPATION: Tender to palpation bilateral upper trap and levator stretch        CERVICAL ROM:    Active ROM A/PROM (deg) eval  Flexion 35  Extension 15  Right lateral flexion 10  Left lateral flexion 10  Right rotation 30  Left rotation 20   (Blank rows = not tested)   UPPER EXTREMITY ROM:                       Able to reach overhead and behind back but reports left shoulder pain   UPPER EXTREMITY MMT:  MMT Right eval Left eval  Shoulder flexion 4+ 4-   Shoulder extension      Shoulder abduction      Shoulder internal rotation      Shoulder external rotation 4+ 4-  Middle trapezius 3 3  Lower trapezius      Elbow flexion 5 5  Elbow extension 5 5  Wrist flexion      Wrist extension      Wrist ulnar deviation      Wrist radial deviation      Wrist pronation      Wrist supination      Grip strength       (Blank rows = not tested)   CERVICAL SPECIAL TESTS:  Not assessed   FUNCTIONAL TESTS:  Not assessed     TODAY'S TREATMENT: [facility] Adult PT Treatment:                                                DATE: 06/01/22 Therapeutic Exercise: Seated scap squeeze and shoulder rolls Cervical AROM x 5 each way  Seated rotation SNAGs 10 x 2 each - mod cues for technique Levator stretch 3 x 10 sec each  Doorway stretch x 3  Supine head on ball for cervical rotations and nods Supine dowel pullovers in comfortable ROM due to left shoulder pain.   Manual Therapy: STW to cervical paraspinals , cervical distraction manually  Modalities: HMP cervical x 10 minutes     [facility] Adult PT Treatment:                                                DATE: 05/27/2022 Therapeutic Exercise: Doorway pec stretch 3 x 20 sec Row with green 2 x 10 Cervical rotation SNAG x 10 each   PATIENT EDUCATION:  Education details: Exam findings, POC, HEP Person educated: Patient Education method: Explanation, Demonstration, Tactile cues, Verbal cues, and Handouts Education comprehension: verbalized understanding, returned demonstration, verbal cues required, tactile cues required, and needs further education   HOME EXERCISE PROGRAM: Access Code: [email or web address]      ASSESSMENT: CLINICAL IMPRESSION: Patient is a 88 y.o. male who was seen today for physical therapy treatment for chronic neck pain with history of C4-C7 fusion in 2017. Reviewed HEP and progressed with neck stability and mobility. STW performed to posterior neck to decrease tension. HMP also applied at  end of session. Pt reports continued pain with cervical rotation. Discussed TPRN and he is open to the treatment however reports it was not much help previously.    OBJECTIVE IMPAIRMENTS: decreased activity tolerance, decreased ROM, decreased strength, impaired flexibility, postural dysfunction, and pain.    ACTIVITY LIMITATIONS: lifting, bending, sleeping, dressing, reach over head, and hygiene/grooming   PARTICIPATION LIMITATIONS: meal prep, cleaning, and driving   PERSONAL FACTORS: Age, Fitness, Past/current experiences, and Time since onset of injury/illness/exacerbation are also affecting patient's functional outcome.    REHAB POTENTIAL: Good   CLINICAL DECISION MAKING: Stable/uncomplicated   EVALUATION COMPLEXITY: Low     GOALS: Goals reviewed with patient? Yes   SHORT TERM GOALS: Target date: 06/24/2022   Patient will be I with initial HEP in order to progress with therapy. Baseline: HEP provided at eval Goal status: INITIAL  2.  PT will review FOTO with patient by 3rd visit in order to understand expected progress and outcome with therapy. Baseline: FOTO assessed at eval Goal status: INITIAL   3.  Patient will report pain with turning neck </= 5/10 in order to improve driving and reduce functional limitations Baseline: 10/10 pain with turning neck Goal status: INITIAL   LONG TERM GOALS: Target date: 07/22/2022   Patient will be I with final HEP to maintain progress from PT. Baseline: HEP provided at eval Goal status: INITIAL   2.  Patient will report >/= 60% status on FOTO to indicate improved functional ability. Baseline: 56% functional status Goal status: INITIAL   3.  Patient will demonstrate cervical rotation >/= 45 deg in order to improve driving ability Baseline: see limitations noted above Goal status: INITIAL   4.  Patient will demonstrate periscapular strength >/= 4/5 MMT in order to improve postural control Baseline: 4-/5 MMT Goal status: INITIAL      PLAN: PT FREQUENCY: 1x/week   PT DURATION: 8 weeks   PLANNED INTERVENTIONS: Therapeutic exercises, Therapeutic activity, Neuromuscular re-education, Balance training, Gait training, Patient/Family education, Self Care, Joint mobilization, Aquatic Therapy, Dry Needling, Cryotherapy, Moist heat, Manual therapy, and Re-evaluation   PLAN FOR NEXT SESSION: Review HEP and progress PRN, manual/dry needling for upper trap region, cervical mobility and progress postural strengthening and endurance   Hessie Diener, PTA 06/01/22 9:39 AM Phone: [phone] Fax: [phone]

## 2022-06-11 NOTE — Therapy (Signed)
OUTPATIENT PHYSICAL THERAPY TREATMENT NOTE   Patient Name: Nicholas Patton MRN: [id number] DOB:1934/12/22, 88 y.o., male Today's Date: 06/12/2022  PCP: Corwin Levins, MD REFERRING PROVIDER: Rodolph Bong, MD   END OF SESSION:   PT End of Session - 06/12/22 1004     Visit Number 3    Number of Visits 9    Date for PT Re-Evaluation 07/22/22    Authorization Type MCR    Progress Note Due on Visit 10    PT Start Time 1010    PT Stop Time 1050    PT Time Calculation (min) 40 min    Activity Tolerance Patient tolerated treatment well    Behavior During Therapy WFL for tasks assessed/performed              Past Medical History:  Diagnosis Date   Anemia    Arthritis    Cataract    REMOVED BILATERAL   Colon polyps    DIABETES MELLITUS, TYPE II    takes Metformin and Glimepiride daily   Diverticulosis    GERD (gastroesophageal reflux disease)    takes Protonix daily   History of colon polyps    HYPERLIPIDEMIA    takes Atorvastatin daily   HYPERTENSION    takes Amlodipine and Hyzaar daily   Ileus following gastrointestinal surgery 07/22/2012   Joint pain    Peripheral neuropathy    Peripheral neuropathy    PERIPHERAL NEUROPATHY, FEET 10/06/2007   Renal disorder    VITAMIN B12 DEFICIENCY 09/06/2008   VITAMIN D DEFICIENCY 01/16/2010   takes Vitamin D daily   Past Surgical History:  Procedure Laterality Date   CATARACT EXTRACTION     both eyes   CHOLECYSTECTOMY N/A 07/18/2012   Procedure: LAPAROSCOPIC CHOLECYSTECTOMY WITH INTRAOPERATIVE CHOLANGIOGRAM;  Surgeon: Liz Malady, MD;  Location: [facility];  Service: General;  Laterality: N/A;   COLONOSCOPY     POLYPECTOMY     POSTERIOR CERVICAL FUSION/FORAMINOTOMY N/A 06/27/2015   Procedure: Posterior Cervical Fusion with lateral mass fixation Cervical four to cervical seven;  Surgeon: Tia Alert, MD;  Location: [facility];  Service: Neurosurgery;  Laterality: N/A;   TOTAL KNEE ARTHROPLASTY  2001   Left   TURP  VAPORIZATION     Patient Active Problem List   Diagnosis Date Noted   COPD (chronic obstructive pulmonary disease) 04/22/2022   Acute asthmatic bronchitis 12/02/2021   Bilateral ankle pain 04/21/2021   Acute cough 01/19/2021   Pain and swelling of lower leg 11/24/2020   Scrotal itching 04/06/2020   Aortic atherosclerosis 04/05/2020   Gross hematuria 04/05/2020   Pruritus 02/04/2020   Right ankle pain 02/04/2020   Rash 10/08/2019   Asthma 05/19/2019   Abnormal TSH 10/04/2018   Headache 10/04/2018   PHN (postherpetic neuralgia) 04/04/2018   Shingles 03/11/2018   Acute gout due to renal impairment involving toe of left foot 02/02/2018   Eustachian tube dysfunction, left 12/06/2017   MGUS (monoclonal gammopathy of unknown significance) 10/28/2017   Elevated blood protein 10/08/2017   Peripheral edema 09/17/2017   Anemia, iron deficiency 04/10/2016   Left-sided nosebleed 01/13/2016   Hematochezia 11/21/2015   Peripheral neuropathic pain 10/25/2015   Low back pain 10/08/2015   CKD (chronic kidney disease) stage 3, GFR 30-59 ml/min 10/08/2015   S/P cervical spinal fusion 06/27/2015   Increased prostate specific antigen (PSA) velocity 04/09/2015   Chest pain 08/14/2014   Dyspnea 08/14/2014   Urinary frequency 06/19/2014   Overactive bladder 09/27/2013  Onychomycosis 08/08/2013   Pain in lower limb 05/15/2013   Diabetic peripheral neuropathy 05/15/2013   Other hammer toe (acquired) 05/15/2013   HAV (hallux abducto valgus) 05/15/2013   Blood in mouth of unknown source 02/09/2013   Vertigo 12/29/2012   Acute upper respiratory infection 12/29/2012   Erectile dysfunction 09/22/2012   Left ear hearing loss 09/13/2012   S/P laparoscopic cholecystectomy 08/03/2012   Neck pain on left side 04/04/2012   Trapezoid ligament sprain 04/04/2012   Bladder neck obstruction 10/06/2011   Cervical radiculitis 09/26/2010   Preventative health care 09/14/2010   Vitamin D deficiency 01/16/2010    PERS HX TOBACCO USE PRESENTING HAZARDS HEALTH 09/17/2009   B12 deficiency 09/06/2008   INSOMNIA 06/21/2008   LEG PAIN, BILATERAL 10/06/2007   Diabetes 01/24/2007   Hyperlipidemia 01/24/2007   Allergic rhinitis 01/24/2007   BPH (benign prostatic hypertrophy) 01/24/2007   Essential hypertension 10/05/2006   GERD 10/05/2006    REFERRING DIAG: Neck pain  THERAPY DIAG:  Cervicalgia  Abnormal posture  Muscle weakness (generalized)  Rationale for Evaluation and Treatment Rehabilitation  PERTINENT HISTORY: C4-C7 fusion 2017, see PMH above   PRECAUTIONS: None   SUBJECTIVE:                                                                                                                                                                                     SUBJECTIVE STATEMENT:  Patient reports he is doing pretty good. The cold weather has caused him to feel a little stiff but he used a heating pad that made it feel better. He reports pain is still pretty bad when he turns his head to the left.  PAIN:  Are you having pain? Yes:  NPRS scale: 7/10  Pain location: Neck Pain description: Aching, dull, sharp Aggravating factors: Neck movement, sleeping or getting comfortable Relieving factors: Rest   OBJECTIVE: (objective measures completed at initial evaluation unless otherwise dated) PATIENT SURVEYS:  FOTO 56% functional status   POSTURE:             Rounded shoulder and forward head posture   PALPATION: Tender to palpation bilateral upper trap and levator scap        CERVICAL ROM:    Active ROM A/PROM (deg) eval   06/12/2022  Flexion 35   Extension 15   Right lateral flexion 10   Left lateral flexion 10   Right rotation 30 35  Left rotation 20 25   (Blank rows = not tested)   UPPER EXTREMITY ROM:                       Able to reach overhead  and behind back but reports left shoulder pain   UPPER EXTREMITY MMT:   MMT Right eval Left eval  Shoulder flexion 4+ 4-   Shoulder extension      Shoulder abduction      Shoulder internal rotation      Shoulder external rotation 4+ 4-  Middle trapezius 3 3  Lower trapezius      Elbow flexion 5 5  Elbow extension 5 5  Wrist flexion      Wrist extension      Wrist ulnar deviation      Wrist radial deviation      Wrist pronation      Wrist supination      Grip strength       (Blank rows = not tested)   CERVICAL SPECIAL TESTS:  Not assessed   FUNCTIONAL TESTS:  Not assessed     TODAY'S TREATMENT: [facility] Adult PT Treatment:                                                DATE: 06/12/22 Therapeutic Exercise: UBE L1 x 4 min (fwd/bwd) while taking subjective Doorway pec stretch 3 x 30 sec Seated thoracic extension with hands behind head x 5 Supine chin tuck with towel roll behind neck 2 x 10 Sidelying thoracic rotation x 10 each Seated cervical rotation SNAG x 10 each Row with FM 17# 2 x 15 Manual Therapy: Suboccipital release with gentle manual traction x 4 bouts    [facility] Adult PT Treatment:                                                DATE: 06/01/22 Therapeutic Exercise: Seated scap squeeze and shoulder rolls Cervical AROM x 5 each way  Seated rotation SNAGs 10 x 2 each - mod cues for technique Levator stretch 3 x 10 sec each  Doorway stretch x 3  Supine head on ball for cervical rotations and nods Supine dowel pullovers in comfortable ROM due to left shoulder pain.  Manual Therapy: STW to cervical paraspinals , cervical distraction manually Modalities: HMP cervical x 10 minutes   [facility] Adult PT Treatment:                                                DATE: 05/27/2022 Therapeutic Exercise: Doorway pec stretch 3 x 20 sec Row with green 2 x 10 Cervical rotation SNAG x 10 each   PATIENT EDUCATION:  Education details: HEP Person educated: Patient Education method: Programmer, multimedia, Demonstration, Tactile cues, Verbal cues Education comprehension: verbalized understanding, returned  demonstration, verbal cues required, tactile cues required, and needs further education   HOME EXERCISE PROGRAM: Access Code: [email or web address]      ASSESSMENT: CLINICAL IMPRESSION: Patient tolerated therapy well with no adverse effects. He does exhibit improvement in his cervical rotation this visit but continues to report increased pain primarily turning to the left. Therapy focused on continued progression of cervical mobility and postural strengthening with good tolerance. He denied trying TPDN this visit and elected for more exercises. Updated his HEP to progress spinal mobility. He  does report improvement in symptoms with cervical rotation and reduced tightness at end of session. Patient would benefit from continued skilled PT to progress his mobility and strength in order to reduce pain and maximize functional ability.     OBJECTIVE IMPAIRMENTS: decreased activity tolerance, decreased ROM, decreased strength, impaired flexibility, postural dysfunction, and pain.    ACTIVITY LIMITATIONS: lifting, bending, sleeping, dressing, reach over head, and hygiene/grooming   PARTICIPATION LIMITATIONS: meal prep, cleaning, and driving   PERSONAL FACTORS: Age, Fitness, Past/current experiences, and Time since onset of injury/illness/exacerbation are also affecting patient's functional outcome.      GOALS: Goals reviewed with patient? Yes   SHORT TERM GOALS: Target date: 06/24/2022   Patient will be I with initial HEP in order to progress with therapy. Baseline: HEP provided at eval Goal status: INITIAL   2.  PT will review FOTO with patient by 3rd visit in order to understand expected progress and outcome with therapy. Baseline: FOTO assessed at eval Goal status: INITIAL   3.  Patient will report pain with turning neck </= 5/10 in order to improve driving and reduce functional limitations Baseline: 10/10 pain with turning neck Goal status: INITIAL   LONG TERM GOALS: Target date: 07/22/2022    Patient will be I with final HEP to maintain progress from PT. Baseline: HEP provided at eval Goal status: INITIAL   2.  Patient will report >/= 60% status on FOTO to indicate improved functional ability. Baseline: 56% functional status Goal status: INITIAL   3.  Patient will demonstrate cervical rotation >/= 45 deg in order to improve driving ability Baseline: see limitations noted above Goal status: INITIAL   4.  Patient will demonstrate periscapular strength >/= 4/5 MMT in order to improve postural control Baseline: 4-/5 MMT Goal status: INITIAL     PLAN: PT FREQUENCY: 1x/week   PT DURATION: 8 weeks   PLANNED INTERVENTIONS: Therapeutic exercises, Therapeutic activity, Neuromuscular re-education, Balance training, Gait training, Patient/Family education, Self Care, Joint mobilization, Aquatic Therapy, Dry Needling, Cryotherapy, Moist heat, Manual therapy, and Re-evaluation   PLAN FOR NEXT SESSION: Review HEP and progress PRN, manual/dry needling for upper trap region, cervical mobility and progress postural strengthening and endurance   Rosana Hoes, PT, DPT, LAT, ATC 06/12/22  10:56 AM Phone: [phone] Fax: [phone]

## 2022-06-12 ENCOUNTER — Other Ambulatory Visit: Payer: Self-pay

## 2022-06-12 ENCOUNTER — Encounter: Payer: Self-pay | Admitting: Physical Therapy

## 2022-06-12 ENCOUNTER — Ambulatory Visit: Payer: Medicare Other | Attending: Family Medicine | Admitting: Physical Therapy

## 2022-06-12 DIAGNOSIS — M542 Cervicalgia: Secondary | ICD-10-CM | POA: Insufficient documentation

## 2022-06-12 DIAGNOSIS — M6281 Muscle weakness (generalized): Secondary | ICD-10-CM | POA: Insufficient documentation

## 2022-06-12 DIAGNOSIS — R293 Abnormal posture: Secondary | ICD-10-CM | POA: Insufficient documentation

## 2022-06-12 NOTE — Patient Instructions (Signed)
Access Code: [email or web address] URL: [email or web address] Date: 06/12/2022 Prepared by: Rosana Hoes  Exercises - Sidelying Thoracic Lumbar Rotation  - 2 x daily - 10 reps - Doorway Pec Stretch at 90 Degrees Abduction  - 2 x daily - 3 reps - 20 seconds hold - Standing Row with Anchored Resistance  - 2 x daily - 20 reps - Seated Assisted Cervical Rotation with Towel  - 2 x daily - 10 reps

## 2022-06-19 ENCOUNTER — Ambulatory Visit: Payer: Medicare Other | Admitting: Physical Therapy

## 2022-06-19 ENCOUNTER — Encounter: Payer: Self-pay | Admitting: Physical Therapy

## 2022-06-19 DIAGNOSIS — M6281 Muscle weakness (generalized): Secondary | ICD-10-CM

## 2022-06-19 DIAGNOSIS — R293 Abnormal posture: Secondary | ICD-10-CM

## 2022-06-19 DIAGNOSIS — M542 Cervicalgia: Secondary | ICD-10-CM | POA: Diagnosis not present

## 2022-06-19 NOTE — Therapy (Addendum)
OUTPATIENT PHYSICAL THERAPY TREATMENT NOTE  DISCHARGE   Patient Name: Nicholas Patton MRN: [id number] DOB:1934/10/03, 88 y.o., male 66 Date: 06/19/2022  PCP: Corwin Levins, MD REFERRING PROVIDER: Rodolph Bong, MD   END OF SESSION:   PT End of Session - 06/19/22 1022     Visit Number 4    Number of Visits 9    Date for PT Re-Evaluation 07/22/22    Authorization Type MCR    Progress Note Due on Visit 10    PT Start Time 1020    PT Stop Time 1100    PT Time Calculation (min) 40 min              Past Medical History:  Diagnosis Date   Anemia    Arthritis    Cataract    REMOVED BILATERAL   Colon polyps    DIABETES MELLITUS, TYPE II    takes Metformin and Glimepiride daily   Diverticulosis    GERD (gastroesophageal reflux disease)    takes Protonix daily   History of colon polyps    HYPERLIPIDEMIA    takes Atorvastatin daily   HYPERTENSION    takes Amlodipine and Hyzaar daily   Ileus following gastrointestinal surgery 07/22/2012   Joint pain    Peripheral neuropathy    Peripheral neuropathy    PERIPHERAL NEUROPATHY, FEET 10/06/2007   Renal disorder    VITAMIN B12 DEFICIENCY 09/06/2008   VITAMIN D DEFICIENCY 01/16/2010   takes Vitamin D daily   Past Surgical History:  Procedure Laterality Date   CATARACT EXTRACTION     both eyes   CHOLECYSTECTOMY N/A 07/18/2012   Procedure: LAPAROSCOPIC CHOLECYSTECTOMY WITH INTRAOPERATIVE CHOLANGIOGRAM;  Surgeon: Liz Malady, MD;  Location: [facility] OR;  Service: General;  Laterality: N/A;   COLONOSCOPY     POLYPECTOMY     POSTERIOR CERVICAL FUSION/FORAMINOTOMY N/A 06/27/2015   Procedure: Posterior Cervical Fusion with lateral mass fixation Cervical four to cervical seven;  Surgeon: Tia Alert, MD;  Location: [facility];  Service: Neurosurgery;  Laterality: N/A;   TOTAL KNEE ARTHROPLASTY  2001   Left   TURP VAPORIZATION     Patient Active Problem List   Diagnosis Date Noted   COPD (chronic obstructive pulmonary  disease) 04/22/2022   Acute asthmatic bronchitis 12/02/2021   Bilateral ankle pain 04/21/2021   Acute cough 01/19/2021   Pain and swelling of lower leg 11/24/2020   Scrotal itching 04/06/2020   Aortic atherosclerosis 04/05/2020   Gross hematuria 04/05/2020   Pruritus 02/04/2020   Right ankle pain 02/04/2020   Rash 10/08/2019   Asthma 05/19/2019   Abnormal TSH 10/04/2018   Headache 10/04/2018   PHN (postherpetic neuralgia) 04/04/2018   Shingles 03/11/2018   Acute gout due to renal impairment involving toe of left foot 02/02/2018   Eustachian tube dysfunction, left 12/06/2017   MGUS (monoclonal gammopathy of unknown significance) 10/28/2017   Elevated blood protein 10/08/2017   Peripheral edema 09/17/2017   Anemia, iron deficiency 04/10/2016   Left-sided nosebleed 01/13/2016   Hematochezia 11/21/2015   Peripheral neuropathic pain 10/25/2015   Low back pain 10/08/2015   CKD (chronic kidney disease) stage 3, GFR 30-59 ml/min 10/08/2015   S/P cervical spinal fusion 06/27/2015   Increased prostate specific antigen (PSA) velocity 04/09/2015   Chest pain 08/14/2014   Dyspnea 08/14/2014   Urinary frequency 06/19/2014   Overactive bladder 09/27/2013   Onychomycosis 08/08/2013   Pain in lower limb 05/15/2013   Diabetic peripheral neuropathy 05/15/2013  Other hammer toe (acquired) 05/15/2013   HAV (hallux abducto valgus) 05/15/2013   Blood in mouth of unknown source 02/09/2013   Vertigo 12/29/2012   Acute upper respiratory infection 12/29/2012   Erectile dysfunction 09/22/2012   Left ear hearing loss 09/13/2012   S/P laparoscopic cholecystectomy 08/03/2012   Neck pain on left side 04/04/2012   Trapezoid ligament sprain 04/04/2012   Bladder neck obstruction 10/06/2011   Cervical radiculitis 09/26/2010   Preventative health care 09/14/2010   Vitamin D deficiency 01/16/2010   PERS HX TOBACCO USE PRESENTING HAZARDS HEALTH 09/17/2009   B12 deficiency 09/06/2008   INSOMNIA  06/21/2008   LEG PAIN, BILATERAL 10/06/2007   Diabetes 01/24/2007   Hyperlipidemia 01/24/2007   Allergic rhinitis 01/24/2007   BPH (benign prostatic hypertrophy) 01/24/2007   Essential hypertension 10/05/2006   GERD 10/05/2006    REFERRING DIAG: Neck pain  THERAPY DIAG:  Cervicalgia  Muscle weakness (generalized)  Abnormal posture  Rationale for Evaluation and Treatment Rehabilitation  PERTINENT HISTORY: C4-C7 fusion 2017, see PMH above   PRECAUTIONS: None   SUBJECTIVE:                                                                                                                                                                                     SUBJECTIVE STATEMENT:  Patient reports sometimes the exercises make him feel better but yesterday the neck was hurting pretty bad with the rainy weather.    PAIN:  Are you having pain? Yes:  NPRS scale: 6/10  Pain location: Neck Pain description: Aching, dull, sharp Aggravating factors: Neck movement, sleeping or getting comfortable Relieving factors: Rest   OBJECTIVE: (objective measures completed at initial evaluation unless otherwise dated) PATIENT SURVEYS:  FOTO 56% functional status   POSTURE:             Rounded shoulder and forward head posture   PALPATION: Tender to palpation bilateral upper trap and levator scap        CERVICAL ROM:    Active ROM A/PROM (deg) eval   06/12/2022   4/12/244  Flexion 35    Extension 15    Right lateral flexion 10    Left lateral flexion 10    Right rotation 30 35 30  Left rotation 20 25 20    (Blank rows = not tested)   UPPER EXTREMITY ROM:                       Able to reach overhead and behind back but reports left shoulder pain   UPPER EXTREMITY MMT:   MMT Right eval Left eval  Shoulder flexion 4+ 4-  Shoulder extension  Shoulder abduction      Shoulder internal rotation      Shoulder external rotation 4+ 4-  Middle trapezius 3 3  Lower trapezius       Elbow flexion 5 5  Elbow extension 5 5  Wrist flexion      Wrist extension      Wrist ulnar deviation      Wrist radial deviation      Wrist pronation      Wrist supination      Grip strength       (Blank rows = not tested)   CERVICAL SPECIAL TESTS:  Not assessed   FUNCTIONAL TESTS:  Not assessed     TODAY'S TREATMENT: [facility] Adult PT Treatment:                                                DATE: 06/19/22 Therapeutic Exercise: L2 UBE x 5 in Doorway pec stretch Back on wall- yellow band horiz abdct, bilat ER with scap retract  Standing open books x 5 each  Seated cervical rotation SNAGS Shoulder Row green  Supine shoulder flexion with dowel -90 degrees and up  Supine chin tuck with towel roll 2 x 10 Manual Therapy: STW to cervical paraspinals , cervical distraction manually  [facility] Adult PT Treatment:                                                DATE: 06/12/22 Therapeutic Exercise: UBE L1 x 4 min (fwd/bwd) while taking subjective Doorway pec stretch 3 x 30 sec Seated thoracic extension with hands behind head x 5 Supine chin tuck with towel roll behind neck 2 x 10 Sidelying thoracic rotation x 10 each Seated cervical rotation SNAG x 10 each Row with FM 17# 2 x 15 Manual Therapy: Suboccipital release with gentle manual traction x 4 bouts    [facility] Adult PT Treatment:                                                DATE: 06/01/22 Therapeutic Exercise: Seated scap squeeze and shoulder rolls Cervical AROM x 5 each way  Seated rotation SNAGs 10 x 2 each - mod cues for technique Levator stretch 3 x 10 sec each  Doorway stretch x 3  Supine head on ball for cervical rotations and nods Supine dowel pullovers in comfortable ROM due to left shoulder pain.  Manual Therapy: STW to cervical paraspinals , cervical distraction manually Modalities: HMP cervical x 10 minutes   [facility] Adult PT Treatment:                                                DATE: 05/27/2022 Therapeutic  Exercise: Doorway pec stretch 3 x 20 sec Row with green 2 x 10 Cervical rotation SNAG x 10 each   PATIENT EDUCATION:  Education details: HEP Person educated: Patient Education method: Explanation, Demonstration, Actor cues, Verbal cues Education comprehension: verbalized understanding, returned demonstration,  verbal cues required, tactile cues required, and needs further education   HOME EXERCISE PROGRAM: Access Code: [email or web address]      ASSESSMENT: CLINICAL IMPRESSION: Patient tolerated therapy well with no adverse effects. Cervical rotation decreased compared to last session. He reports some days are better than others but can still have high level of pain, especially with left rotation. He denied trying TPDN this visit and elected for more exercises. Updated his HEP to progress shoulder /scap stabilization. He will see MD next week for F/U.  Patient would benefit from continued skilled PT to progress his mobility and strength in order to reduce pain and maximize functional ability.     OBJECTIVE IMPAIRMENTS: decreased activity tolerance, decreased ROM, decreased strength, impaired flexibility, postural dysfunction, and pain.    ACTIVITY LIMITATIONS: lifting, bending, sleeping, dressing, reach over head, and hygiene/grooming   PARTICIPATION LIMITATIONS: meal prep, cleaning, and driving   PERSONAL FACTORS: Age, Fitness, Past/current experiences, and Time since onset of injury/illness/exacerbation are also affecting patient's functional outcome.      GOALS: Goals reviewed with patient? Yes   SHORT TERM GOALS: Target date: 06/24/2022   Patient will be I with initial HEP in order to progress with therapy. Baseline: HEP provided at eval 06/19/22: completes daily Goal status: MET   2.  PT will review FOTO with patient by 3rd visit in order to understand expected progress and outcome with therapy. Baseline: FOTO assessed at eval Goal status: MET   3.  Patient will report pain with  turning neck </= 5/10 in order to improve driving and reduce functional limitations Baseline: 10/10 pain with turning neck 06/19/22: sometimes it is better Goal status: ONGOING   LONG TERM GOALS: Target date: 07/22/2022   Patient will be I with final HEP to maintain progress from PT. Baseline: HEP provided at eval Goal status: INITIAL   2.  Patient will report >/= 60% status on FOTO to indicate improved functional ability. Baseline: 56% functional status Goal status: INITIAL   3.  Patient will demonstrate cervical rotation >/= 45 deg in order to improve driving ability Baseline: see limitations noted above Goal status: INITIAL   4.  Patient will demonstrate periscapular strength >/= 4/5 MMT in order to improve postural control Baseline: 4-/5 MMT Goal status: INITIAL     PLAN: PT FREQUENCY: 1x/week   PT DURATION: 8 weeks   PLANNED INTERVENTIONS: Therapeutic exercises, Therapeutic activity, Neuromuscular re-education, Balance training, Gait training, Patient/Family education, Self Care, Joint mobilization, Aquatic Therapy, Dry Needling, Cryotherapy, Moist heat, Manual therapy, and Re-evaluation   PLAN FOR NEXT SESSION: How was MD visit. Review HEP and progress PRN, manual/dry needling for upper trap region, cervical mobility and progress postural strengthening and endurance   Jannette Spanner, PTA 06/19/22 11:23 AM Phone: [phone] Fax: [phone]       PHYSICAL THERAPY DISCHARGE SUMMARY  Visits from Start of Care: 4  Current functional level related to goals / functional outcomes: See above   Remaining deficits: See above   Education / Equipment: HEP   Patient agrees to discharge. Patient goals were not met. Patient is being discharged due to not returning since the last visit.  Rosana Hoes, PT, DPT, LAT, ATC 07/27/22  1:58 PM Phone: [phone] Fax: [phone]

## 2022-06-22 ENCOUNTER — Ambulatory Visit (INDEPENDENT_AMBULATORY_CARE_PROVIDER_SITE_OTHER): Payer: Medicare Other | Admitting: Family Medicine

## 2022-06-22 ENCOUNTER — Encounter: Payer: Self-pay | Admitting: Family Medicine

## 2022-06-22 VITALS — BP 138/78 | HR 50 | Ht 74.0 in | Wt 196.2 lb

## 2022-06-22 DIAGNOSIS — M542 Cervicalgia: Secondary | ICD-10-CM

## 2022-06-22 NOTE — Progress Notes (Signed)
   I, Stevenson Clinch, CMA acting as a scribe for Clementeen Graham, MD.  Nicholas Patton is a 88 y.o. male who presents to [facility] today for 6-wk f/u neck pain. Pt was last seen by Dr. Denyse Amass on 05/11/22 and was advised to use a heating pad, prescribed tizanidine, and was referred to PT, completing 4 visits. Today, pt reports minimal change in sx with Tizanidine and PT. Notes increased soreness after PT. Sx also worse with cooler weather. Denies worsening sx since last visit. Radiating pain into the left shoulder radiating into the upper arm. Denies HA associated with neck pain. Denies weakness/decreased grip strength.   Dx imaging: 05/11/22 C-spine XR  Pertinent review of systems: No fevers or chills  Relevant historical information: Neck arthritis and cervical fusion.   Exam:  BP 138/78   Pulse (!) 50   Ht 6\' 2"  (1.88 m)   Wt 196 lb 3.2 oz (89 kg)   SpO2 94%   BMI 25.19 kg/m  General: Well Developed, well nourished, and in no acute distress.   MSK: C-spine decreased cervical motion.    Lab and Radiology Results  EXAM: CERVICAL SPINE - 2-3 VIEW   COMPARISON:  Cervical spine x-ray 10/28/2015   FINDINGS: Bones are diffusely osteopenic. Posterior fusion hardware is seen at the C4, C5, C6 and C7 levels similar to the prior study. There is no evidence for hardware loosening. Alignment is anatomic. There is been interval fusion of vertebral bodies and progression of disc space height loss at C5-C6 and C6-C7. Otherwise, disc space narrowing at C7-T1 has progressed. There is no prevertebral soft tissue swelling. There is no acute fracture. Degenerative changes are seen at C1-C2.   IMPRESSION: 1. Posterior fusion hardware at C4, C5, C6 and C7 similar to prior study. 2. Progression of degenerative disc disease at C5-C6, C6-C7 and C7-T1.     Electronically Signed   By: Darliss Cheney M.D.   On: 05/11/2022 22:05 I, Clementeen Graham, personally  (independently) visualized and performed the interpretation of the images attached in this note.     Assessment and Plan: 88 y.o. male with chronic neck pain.  He has a fair amount of degenerative changes in his cervical spine.  The hardware looks okay last x-ray a month ago.  At this point he has not had dry needling and physical therapy which I think could be helpful.  We talked about the potential for injections.  He mentions that he thinks he had injections in his neck after his surgery and is not interested in pursuing those injections at this time.  Plan for trial of dry needling.  Check back with me as needed.    Discussed warning signs or symptoms. Please see discharge instructions. Patient expresses understanding.   The above documentation has been reviewed and is accurate and complete Clementeen Graham, M.D.

## 2022-06-22 NOTE — Patient Instructions (Addendum)
Thank you for coming in today.   Let PT try th dry needling.   If not better let me know and I can get you set up for those neck injections again.

## 2022-06-25 ENCOUNTER — Encounter (HOSPITAL_COMMUNITY): Payer: Self-pay

## 2022-06-25 ENCOUNTER — Emergency Department (HOSPITAL_COMMUNITY)
Admission: EM | Admit: 2022-06-25 | Discharge: 2022-06-25 | Disposition: A | Discharge status: Home / Self Care | Payer: Medicare Other | Attending: Emergency Medicine | Admitting: Emergency Medicine

## 2022-06-25 ENCOUNTER — Other Ambulatory Visit: Payer: Self-pay

## 2022-06-25 DIAGNOSIS — E119 Type 2 diabetes mellitus without complications: Secondary | ICD-10-CM | POA: Diagnosis not present

## 2022-06-25 DIAGNOSIS — J449 Chronic obstructive pulmonary disease, unspecified: Secondary | ICD-10-CM | POA: Diagnosis not present

## 2022-06-25 DIAGNOSIS — Z7982 Long term (current) use of aspirin: Secondary | ICD-10-CM | POA: Diagnosis not present

## 2022-06-25 DIAGNOSIS — R040 Epistaxis: Secondary | ICD-10-CM | POA: Insufficient documentation

## 2022-06-25 DIAGNOSIS — Z7951 Long term (current) use of inhaled steroids: Secondary | ICD-10-CM | POA: Diagnosis not present

## 2022-06-25 DIAGNOSIS — Z7984 Long term (current) use of oral hypoglycemic drugs: Secondary | ICD-10-CM | POA: Insufficient documentation

## 2022-06-25 MED ORDER — LIDOCAINE-EPINEPHRINE-TETRACAINE (LET) TOPICAL GEL
3.0000 mL | Freq: Once | TOPICAL | Status: AC
  Administered 2022-06-25: 3 mL via TOPICAL
  Filled 2022-06-25: qty 3

## 2022-06-25 MED ORDER — SILVER NITRATE-POT NITRATE 75-25 % EX MISC
1.0000 | Freq: Once | CUTANEOUS | Status: AC
  Administered 2022-06-25: 1 via TOPICAL
  Filled 2022-06-25: qty 10

## 2022-06-25 NOTE — ED Provider Notes (Signed)
[facility] EMERGENCY DEPARTMENT AT [facility] Provider Note   CSN: [id number] Arrival date & time: 06/25/22  1232     History  Chief Complaint  Patient presents with   Epistaxis    Nicholas Patton is a 88 y.o. male.  History of asthma, COPD, diabetes, presents the ER complaining of bleeding from the left nostril started last night around 10 PM, last trauma, he takes baby aspirin daily but no blood thinners.  He has had nosebleeds intermittently in the past and had to have cautery on the left side at one point.  He denies fevers or chills, denies dizziness.  He states he is able to get stopped and then after couple hours will start bleeding again, he states nurse put clamps on his nose that stopped bleeding in manage.   Epistaxis      Home Medications Prior to Admission medications   Medication Sig Start Date End Date Taking? Authorizing Provider  albuterol (VENTOLIN HFA) 108 (90 Base) MCG/ACT inhaler INHALE 2 PUFFS BY MOUTH EVERY 6 HOURS AS NEEDED FOR WHEEZING FOR SHORTNESS OF BREATH 07/22/21   Hunsucker, Lesia Sago, MD  allopurinol (ZYLOPRIM) 100 MG tablet Take 1 tablet (100 mg total) by mouth daily. 01/09/22   Rodolph Bong, MD  aspirin 81 MG EC tablet Take 81 mg by mouth daily.    [provider]  atorvastatin (LIPITOR) 40 MG tablet Take 1 tablet (40 mg total) by mouth daily. 01/16/22   Corwin Levins, MD  carvedilol (COREG) 12.5 MG tablet TAKE 1 TABLET BY MOUTH  TWICE DAILY WITH MEALS 03/24/21   Corwin Levins, MD  doxazosin (CARDURA) 2 MG tablet Take 1 tablet (2 mg total) by mouth daily. 10/04/20   Corwin Levins, MD  finasteride (PROSCAR) 5 MG tablet Take 5 mg by mouth daily. 07/11/20   [provider]  fluticasone furoate-vilanterol (BREO ELLIPTA) 200-25 MCG/ACT AEPB Inhale 1 puff into the lungs daily. 03/25/22   Hunsucker, Lesia Sago, MD  furosemide (LASIX) 20 MG tablet Take 20 mg by mouth daily. 08/13/20   [provider]  glimepiride (AMARYL) 2 MG  tablet TAKE 2 TABLETS BY MOUTH DAILY  BEFORE BREAKFAST 04/20/22   Corwin Levins, MD  levothyroxine (SYNTHROID) 25 MCG tablet Take 1 tablet (25 mcg total) by mouth daily. 04/22/22   Corwin Levins, MD  losartan (COZAAR) 100 MG tablet TAKE 1 TABLET BY MOUTH DAILY 04/20/22   Corwin Levins, MD  Multiple Vitamins-Minerals (MULTIVITAMIN PO) Take 1 tablet by mouth daily.    [provider]  pantoprazole (PROTONIX) 40 MG tablet Take 1 tablet (40 mg total) by mouth daily. 01/16/22   Corwin Levins, MD  PAXIL 10 MG tablet Take 10 mg by mouth daily. 09/25/20   [provider]  pioglitazone (ACTOS) 15 MG tablet TAKE 1 TABLET BY MOUTH DAILY 09/23/21   Corwin Levins, MD  predniSONE (DELTASONE) 20 MG tablet Take 1 tablet (20 mg total) by mouth daily with breakfast. 02/02/22   Hunsucker, Lesia Sago, MD  tamsulosin (FLOMAX) 0.4 MG CAPS capsule Take 1 capsule (0.4 mg total) by mouth daily. 01/16/22   Corwin Levins, MD  tiZANidine (ZANAFLEX) 2 MG tablet Take 1-2 tablets (2-4 mg total) by mouth at bedtime as needed for muscle spasms. 05/11/22   Rodolph Bong, MD      Allergies    Amlodipine, Cymbalta [duloxetine hcl], and Oxycodone    Review of Systems   Review  of Systems  HENT:  Positive for nosebleeds.     Physical Exam Updated Vital Signs BP (!) 154/74 (BP Location: Left Arm)   Pulse (!) 50   Temp 97.7 F (36.5 C) (Oral)   Resp 16   Ht 6\' 2"  (1.88 m)   Wt 88.9 kg   SpO2 97%   BMI 25.16 kg/m  Physical Exam Vitals and nursing note reviewed.  Constitutional:      General: He is not in acute distress.    Appearance: He is well-developed.  HENT:     Head: Normocephalic and atraumatic.     Nose: No signs of injury or nasal tenderness.     Right Nostril: Epistaxis present. No foreign body or septal hematoma.     Left Nostril: No foreign body.     Comments: Mucosa on right side has 2 areas of very minimal oozing    Mouth/Throat:     Mouth: Mucous membranes are moist.  Eyes:      Conjunctiva/sclera: Conjunctivae normal.  Cardiovascular:     Rate and Rhythm: Normal rate and regular rhythm.     Heart sounds: No murmur heard. Pulmonary:     Effort: Pulmonary effort is normal. No respiratory distress.     Breath sounds: Normal breath sounds.  Abdominal:     Palpations: Abdomen is soft.     Tenderness: There is no abdominal tenderness.  Musculoskeletal:        General: No swelling.     Cervical back: Neck supple.  Skin:    General: Skin is warm and dry.     Capillary Refill: Capillary refill takes less than 2 seconds.  Neurological:     General: No focal deficit present.     Mental Status: He is alert and oriented to person, place, and time.  Psychiatric:        Mood and Affect: Mood normal.     ED Results / Procedures / Treatments   Labs (all labs ordered are listed, but only abnormal results are displayed) Labs Reviewed - No data to display  EKG None  Radiology No results found.  Procedures .Epistaxis Management  Date/Time: 06/25/2022 4:00 PM  Performed by: Ma Rings, PA-C Authorized by: Ma Rings, PA-C   Consent:    Consent obtained:  Verbal   Consent given by:  Patient   Risks, benefits, and alternatives were discussed: yes     Risks discussed:  Bleeding, infection, pain and nasal injury   Alternatives discussed:  No treatment, alternative treatment and observation Universal protocol:    Procedure explained and questions answered to patient or proxy's satisfaction: yes     Patient identity confirmed:  Verbally with patient Anesthesia:    Anesthesia method:  Topical application   Topical anesthetic:  LET Procedure details:    Treatment site:  R anterior   Treatment method:  Silver nitrate   Treatment complexity:  Limited   Treatment episode: initial   Post-procedure details:    Assessment:  Bleeding stopped   Procedure completion:  Tolerated well, no immediate complications     Medications Ordered in  ED Medications  silver nitrate applicators applicator 1 Application (1 Application Topical Given by Other 06/25/22 1500)  lidocaine-EPINEPHrine-tetracaine (LET) topical gel (3 mLs Topical Given by Other 06/25/22 1500)    ED Course/ Medical Decision Making/ A&P  Medical Decision Making Ddx: Epistaxis anterior versus posterior, sinusitis, other ED course: Epistaxis.  No signs of 21 mm, very minimal bleeding on exam, discussed risk and benefits of revision versus treatment more conservatively with Afrin and bleeding the patient is agreeable with cauterization and has had this done in the past.  He is requesting something to numb him, used LET, he tolerated this well, bleeding is stopped.  Advised on ENT follow-up and return precautions.  Risk Prescription drug management.           Final Clinical Impression(s) / ED Diagnoses Final diagnoses:  Right-sided epistaxis    Rx / DC Orders ED Discharge Orders     None         Ma Rings, PA-C 06/25/22 1600    Lorre Nick, MD 06/26/22 1147

## 2022-06-25 NOTE — ED Notes (Signed)
Splint applied to nose to hold pressure to stop bleeding

## 2022-06-25 NOTE — Discharge Instructions (Signed)
Seen today for nosebleed.  We treated this with cauterization.  Avoid picking or blowing her nose for the next 24 to 48 hours.  Use a humidifier in your bedroom.  Come back if you have repeated bleeding does not stop with pressure.  Follow-up with ENT.

## 2022-06-25 NOTE — ED Triage Notes (Signed)
Epistaxis last night, subsided and came back around 0600 and has been intermittently bleeding. Pt tried afrin with no relief. Denies use of blood thinners. Takes 1 baby aspirin daily.

## 2022-06-26 ENCOUNTER — Encounter: Payer: Medicare Other | Admitting: Physical Therapy

## 2022-06-26 DIAGNOSIS — I129 Hypertensive chronic kidney disease with stage 1 through stage 4 chronic kidney disease, or unspecified chronic kidney disease: Secondary | ICD-10-CM | POA: Diagnosis not present

## 2022-06-26 DIAGNOSIS — N2581 Secondary hyperparathyroidism of renal origin: Secondary | ICD-10-CM | POA: Diagnosis not present

## 2022-06-26 DIAGNOSIS — D631 Anemia in chronic kidney disease: Secondary | ICD-10-CM | POA: Diagnosis not present

## 2022-06-26 DIAGNOSIS — N184 Chronic kidney disease, stage 4 (severe): Secondary | ICD-10-CM | POA: Diagnosis not present

## 2022-06-26 DIAGNOSIS — R600 Localized edema: Secondary | ICD-10-CM | POA: Diagnosis not present

## 2022-06-26 DIAGNOSIS — N189 Chronic kidney disease, unspecified: Secondary | ICD-10-CM | POA: Diagnosis not present

## 2022-06-26 DIAGNOSIS — M109 Gout, unspecified: Secondary | ICD-10-CM | POA: Diagnosis not present

## 2022-07-01 ENCOUNTER — Telehealth: Payer: Self-pay

## 2022-07-01 NOTE — Telephone Encounter (Signed)
        Patient  visited [place] on 4/18 Telephone encounter attempt : 1st   A HIPAA compliant voice message was left requesting a return call.  Instructed patient to call back      [facility] Guide, [place] [phone] [address], [place], [place] [address] Phone: [phone] Email: [email or web address]       

## 2022-07-02 ENCOUNTER — Ambulatory Visit: Payer: Medicare Other | Admitting: Physical Therapy

## 2022-07-03 ENCOUNTER — Encounter: Payer: Self-pay | Admitting: Internal Medicine

## 2022-07-03 ENCOUNTER — Other Ambulatory Visit: Payer: Self-pay | Admitting: Internal Medicine

## 2022-07-06 ENCOUNTER — Other Ambulatory Visit: Payer: Self-pay

## 2022-07-09 DIAGNOSIS — L298 Other pruritus: Secondary | ICD-10-CM | POA: Diagnosis not present

## 2022-07-09 DIAGNOSIS — R202 Paresthesia of skin: Secondary | ICD-10-CM | POA: Diagnosis not present

## 2022-07-12 ENCOUNTER — Other Ambulatory Visit: Payer: Self-pay | Admitting: Internal Medicine

## 2022-07-16 DIAGNOSIS — R040 Epistaxis: Secondary | ICD-10-CM | POA: Diagnosis not present

## 2022-08-03 ENCOUNTER — Encounter (HOSPITAL_COMMUNITY): Payer: Self-pay

## 2022-08-03 ENCOUNTER — Emergency Department (HOSPITAL_COMMUNITY)
Admission: EM | Admit: 2022-08-03 | Discharge: 2022-08-03 | Disposition: A | Discharge status: Home / Self Care | Payer: Medicare Other | Attending: Emergency Medicine | Admitting: Emergency Medicine

## 2022-08-03 ENCOUNTER — Other Ambulatory Visit: Payer: Self-pay

## 2022-08-03 DIAGNOSIS — Z7982 Long term (current) use of aspirin: Secondary | ICD-10-CM | POA: Diagnosis not present

## 2022-08-03 DIAGNOSIS — Z7984 Long term (current) use of oral hypoglycemic drugs: Secondary | ICD-10-CM | POA: Insufficient documentation

## 2022-08-03 DIAGNOSIS — I10 Essential (primary) hypertension: Secondary | ICD-10-CM | POA: Diagnosis not present

## 2022-08-03 DIAGNOSIS — J45909 Unspecified asthma, uncomplicated: Secondary | ICD-10-CM | POA: Insufficient documentation

## 2022-08-03 DIAGNOSIS — E119 Type 2 diabetes mellitus without complications: Secondary | ICD-10-CM | POA: Diagnosis not present

## 2022-08-03 DIAGNOSIS — R58 Hemorrhage, not elsewhere classified: Secondary | ICD-10-CM | POA: Diagnosis not present

## 2022-08-03 DIAGNOSIS — R040 Epistaxis: Secondary | ICD-10-CM | POA: Insufficient documentation

## 2022-08-03 DIAGNOSIS — J449 Chronic obstructive pulmonary disease, unspecified: Secondary | ICD-10-CM | POA: Diagnosis not present

## 2022-08-03 DIAGNOSIS — Z7951 Long term (current) use of inhaled steroids: Secondary | ICD-10-CM | POA: Diagnosis not present

## 2022-08-03 HISTORY — DX: Chronic obstructive pulmonary disease, unspecified: J44.9

## 2022-08-03 LAB — CBC WITH DIFFERENTIAL/PLATELET
Abs Immature Granulocytes: 0.01 10*3/uL (ref 0.00–0.07)
Basophils Absolute: 0 10*3/uL (ref 0.0–0.1)
Basophils Relative: 1 %
Eosinophils Absolute: 0.1 10*3/uL (ref 0.0–0.5)
Eosinophils Relative: 3 %
HCT: 38.6 % — ABNORMAL LOW (ref 39.0–52.0)
Hemoglobin: 12.4 g/dL — ABNORMAL LOW (ref 13.0–17.0)
Immature Granulocytes: 0 %
Lymphocytes Relative: 22 %
Lymphs Abs: 1 10*3/uL (ref 0.7–4.0)
MCH: 31.1 pg (ref 26.0–34.0)
MCHC: 32.1 g/dL (ref 30.0–36.0)
MCV: 96.7 fL (ref 80.0–100.0)
Monocytes Absolute: 0.5 10*3/uL (ref 0.1–1.0)
Monocytes Relative: 10 %
Neutro Abs: 3 10*3/uL (ref 1.7–7.7)
Neutrophils Relative %: 64 %
Platelets: 124 10*3/uL — ABNORMAL LOW (ref 150–400)
RBC: 3.99 MIL/uL — ABNORMAL LOW (ref 4.22–5.81)
RDW: 14.8 % (ref 11.5–15.5)
WBC: 4.6 10*3/uL (ref 4.0–10.5)
nRBC: 0 % (ref 0.0–0.2)

## 2022-08-03 NOTE — Discharge Instructions (Signed)
Today your nose was packed with a Rhino Rocket to stop the bleeding.  Please keep the Rhino Rocket in to be able to see an ENT specialist.  If you begin to have fevers, nausea/vomiting, breakthrough bleeding, Rhino Rocket falls out, or other worsening symptoms with return to ER.  Its very important you follow-up with ENT and if you are unable to follow-up with your ENT specialist-when here for you.

## 2022-08-03 NOTE — ED Provider Notes (Signed)
[facility] AT [facility] Provider Note   CSN: [id number] Arrival date & time: 08/03/22  1201     History  Chief Complaint  Patient presents with   Epistaxis    Nicholas Patton is a 88 y.o. male history of asthma, COPD, diabetes, epistaxis presented after 3 hours of epistaxis.  Patient stated he was eating breakfast when his nose started bleeding.  Patient denies any blood thinners or trauma.  Patient states in the past month he had to have his right nostril cauterized twice and is followed up with ENT.  Patient was brought in via EMS in which his blood pressure was 208/90 however patient was unable to take medications this morning due to epistaxis.  Patient denies chest pain, shortness of breath, fatigue, dizziness, new onset weakness  Home Medications Prior to Admission medications   Medication Sig Start Date End Date Taking? Authorizing Provider  albuterol (VENTOLIN HFA) 108 (90 Base) MCG/ACT inhaler INHALE 2 PUFFS BY MOUTH EVERY 6 HOURS AS NEEDED FOR WHEEZING FOR SHORTNESS OF BREATH 07/22/21   Hunsucker, Lesia Sago, MD  allopurinol (ZYLOPRIM) 100 MG tablet Take 1 tablet (100 mg total) by mouth daily. 01/09/22   Rodolph Bong, MD  aspirin 81 MG EC tablet Take 81 mg by mouth daily.    [provider]  atorvastatin (LIPITOR) 40 MG tablet Take 1 tablet (40 mg total) by mouth daily. 01/16/22   Corwin Levins, MD  carvedilol (COREG) 12.5 MG tablet TAKE 1 TABLET BY MOUTH TWICE  DAILY WITH MEALS 07/06/22   Corwin Levins, MD  doxazosin (CARDURA) 2 MG tablet Take 1 tablet (2 mg total) by mouth daily. 10/04/20   Corwin Levins, MD  finasteride (PROSCAR) 5 MG tablet Take 5 mg by mouth daily. 07/11/20   [provider]  fluticasone furoate-vilanterol (BREO ELLIPTA) 200-25 MCG/ACT AEPB Inhale 1 puff into the lungs daily. 03/25/22   Hunsucker, Lesia Sago, MD  furosemide (LASIX) 20 MG tablet Take 20 mg by mouth daily. 08/13/20   [provider]   glimepiride (AMARYL) 2 MG tablet TAKE 2 TABLETS BY MOUTH DAILY  BEFORE BREAKFAST 04/20/22   Corwin Levins, MD  levothyroxine (SYNTHROID) 25 MCG tablet Take 1 tablet (25 mcg total) by mouth daily. 04/22/22   Corwin Levins, MD  losartan (COZAAR) 100 MG tablet TAKE 1 TABLET BY MOUTH DAILY 04/20/22   Corwin Levins, MD  Multiple Vitamins-Minerals (MULTIVITAMIN PO) Take 1 tablet by mouth daily.    [provider]  pantoprazole (PROTONIX) 40 MG tablet Take 1 tablet (40 mg total) by mouth daily. 01/16/22   Corwin Levins, MD  PAXIL 10 MG tablet Take 10 mg by mouth daily. 09/25/20   [provider]  pioglitazone (ACTOS) 15 MG tablet TAKE 1 TABLET BY MOUTH DAILY 07/17/22   Corwin Levins, MD  predniSONE (DELTASONE) 20 MG tablet Take 1 tablet (20 mg total) by mouth daily with breakfast. 02/02/22   Hunsucker, Lesia Sago, MD  tamsulosin (FLOMAX) 0.4 MG CAPS capsule Take 1 capsule (0.4 mg total) by mouth daily. 01/16/22   Corwin Levins, MD  tiZANidine (ZANAFLEX) 2 MG tablet Take 1-2 tablets (2-4 mg total) by mouth at bedtime as needed for muscle spasms. 05/11/22   Rodolph Bong, MD      Allergies    Amlodipine, Cymbalta [duloxetine hcl], and Oxycodone    Review of Systems   Review of Systems  HENT:  Positive for  nosebleeds.     Physical Exam Updated Vital Signs BP (!) 164/86 (BP Location: Left Arm)   Pulse 78   Temp 97.8 F (36.6 C) (Oral)   Resp 17   Ht 6\' 2"  (1.88 m)   Wt 87.1 kg   SpO2 94%   BMI 24.65 kg/m  Physical Exam Constitutional:      General: He is not in acute distress. HENT:     Head: Normocephalic and atraumatic.     Nose:     Comments: Right nare actively hemorrhaging with bloody gauze When gauze is removed active hemorrhage with no source visualized No septal perforation noted No signs of broken nose    Mouth/Throat:     Comments: Initially postnasal bleeding however after packing was placed this ceased Tolerating secretions Pulmonary:     Comments:  Protecting airway Skin:    General: Skin is warm and dry.     Coloration: Skin is not pale.  Neurological:     Mental Status: He is alert.     ED Results / Procedures / Treatments   Labs (all labs ordered are listed, but only abnormal results are displayed) Labs Reviewed  CBC WITH DIFFERENTIAL/PLATELET - Abnormal; Notable for the following components:      Result Value   RBC 3.99 (*)    Hemoglobin 12.4 (*)    HCT 38.6 (*)    Platelets 124 (*)    All other components within normal limits    EKG None  Radiology No results found.  Procedures .Epistaxis Management  Date/Time: 08/03/2022 2:18 PM  Performed by: Netta Corrigan, PA-C Authorized by: Netta Corrigan, PA-C   Consent:    Consent obtained:  Verbal   Consent given by:  Patient   Risks, benefits, and alternatives were discussed: yes     Risks discussed:  Bleeding, infection, nasal injury and pain   Alternatives discussed:  No treatment Universal protocol:    Patient identity confirmed:  Verbally with patient Anesthesia:    Anesthesia method:  None Procedure details:    Treatment site:  R anterior   Treatment method:  Anterior pack   Treatment complexity:  Limited   Treatment episode: recurring   Post-procedure details:    Assessment:  Bleeding stopped   Procedure completion:  Tolerated     Medications Ordered in ED Medications - No data to display  ED Course/ Medical Decision Making/ A&P                             Medical Decision Making  Nicholas Patton 88 y.o. presented today for epistaxis. Working DDx that I considered at this time includes, but not limited to, anterior/posterior epistaxis, trauma, anemia.  R/o DDx: Posterior epistaxis, trauma, anemia: These are considered less likely due to history of present illness and physical exam findings  Review of prior external notes: 06/25/22 ED provider  Unique Tests and My Interpretation:  CBC:  Discussion with Independent Historian:   Wife  Discussion of Management of Tests: None  Risk: Low: based on diagnostic testing/clinical impression and treatment plan  Risk Stratification Score: None  Staffed with Freida Busman, MD  Plan: Patient presented for epistaxis. On exam patient was in no acute distress and stable vitals.  Patient was actively hemorrhaging out of his right nostril upon arrival.  After removal of gauze cannot visualize source of bleeding I do not feel comfortable putting raising.  Patient also has postnasal bleeding.  Patient is had 2 previous cauterizations which have failed so we will try a Rhino Rocket.  Initially we tried an anterior posterior [employer] however patient was unable to tolerate it and so an anterior [employer] was placed which patient tolerated.  Patient stable at this time.  On recheck patient was no longer bleeding.  This was at 1:20 PM.  Patient will be monitored for 30 minutes and if no more bleeding will be discharged with ENT follow-up.  At 2:14 PM patient's nose was still not bleeding.  Patient CBC was reassuring.  Patient will be discharged with ENT follow-up.  Patient was given return precautions. Patient stable for discharge at this time.  Patient verbalized understanding of plan.         Final Clinical Impression(s) / ED Diagnoses Final diagnoses:  Epistaxis    Rx / DC Orders ED Discharge Orders     None         Remi Deter 08/03/22 1447    Lorre Nick, MD 08/05/22 [phone]

## 2022-08-03 NOTE — ED Triage Notes (Addendum)
Pt BIBA from home with c/o right epistaxis since 0930. Hx of nose bleeds. Cauterized before for same. Given en route afrin in both nostrils.   BP 208/90 HR 90 92% RA

## 2022-08-03 NOTE — ED Provider Notes (Signed)
I provided a substantive portion of the care of this patient.  I personally made/approved the management plan for this patient and take responsibility for the patient management.      88 year old male presents with epistaxis.  History of same.  Treated with Rhinostat and no further bleeding here.  Will follow-up with ENT doctor   Lorre Nick, MD 08/03/22 1428

## 2022-08-04 DIAGNOSIS — R040 Epistaxis: Secondary | ICD-10-CM | POA: Diagnosis not present

## 2022-08-07 ENCOUNTER — Ambulatory Visit (INDEPENDENT_AMBULATORY_CARE_PROVIDER_SITE_OTHER): Payer: Medicare Other | Admitting: Internal Medicine

## 2022-08-07 ENCOUNTER — Telehealth: Payer: Self-pay

## 2022-08-07 ENCOUNTER — Encounter: Payer: Self-pay | Admitting: Internal Medicine

## 2022-08-07 VITALS — BP 120/76 | HR 47 | Temp 97.9°F | Ht 74.0 in | Wt 197.0 lb

## 2022-08-07 DIAGNOSIS — R040 Epistaxis: Secondary | ICD-10-CM

## 2022-08-07 DIAGNOSIS — D696 Thrombocytopenia, unspecified: Secondary | ICD-10-CM | POA: Diagnosis not present

## 2022-08-07 DIAGNOSIS — D509 Iron deficiency anemia, unspecified: Secondary | ICD-10-CM | POA: Diagnosis not present

## 2022-08-07 DIAGNOSIS — I10 Essential (primary) hypertension: Secondary | ICD-10-CM | POA: Diagnosis not present

## 2022-08-07 DIAGNOSIS — J309 Allergic rhinitis, unspecified: Secondary | ICD-10-CM | POA: Diagnosis not present

## 2022-08-07 NOTE — Progress Notes (Unsigned)
Patient ID: ELIM PIEL, male   DOB: 17-Jul-1934, 88 y.o.   MRN: [id number]        Chief Complaint: follow up nosebleed, mild anemia       HPI:  Nicholas Patton is a 88 y.o. male here with c/o        Wt Readings from Last 3 Encounters:  08/07/22 197 lb (89.4 kg)  08/03/22 192 lb (87.1 kg)  06/25/22 196 lb (88.9 kg)   BP Readings from Last 3 Encounters:  08/07/22 120/76  08/03/22 (!) 164/86  06/25/22 (!) 154/74         Past Medical History:  Diagnosis Date   Anemia    Arthritis    Cataract    REMOVED BILATERAL   Colon polyps    COPD (chronic obstructive pulmonary disease) (HCC)    DIABETES MELLITUS, TYPE II    takes Metformin and Glimepiride daily   Diverticulosis    GERD (gastroesophageal reflux disease)    takes Protonix daily   History of colon polyps    HYPERLIPIDEMIA    takes Atorvastatin daily   HYPERTENSION    takes Amlodipine and Hyzaar daily   Ileus following gastrointestinal surgery (HCC) 07/22/2012   Joint pain    Peripheral neuropathy    Peripheral neuropathy    PERIPHERAL NEUROPATHY, FEET 10/06/2007   Renal disorder    VITAMIN B12 DEFICIENCY 09/06/2008   VITAMIN D DEFICIENCY 01/16/2010   takes Vitamin D daily   Past Surgical History:  Procedure Laterality Date   CATARACT EXTRACTION     both eyes   CHOLECYSTECTOMY N/A 07/18/2012   Procedure: LAPAROSCOPIC CHOLECYSTECTOMY WITH INTRAOPERATIVE CHOLANGIOGRAM;  Surgeon: Liz Malady, MD;  Location: [facility] OR;  Service: General;  Laterality: N/A;   COLONOSCOPY     POLYPECTOMY     POSTERIOR CERVICAL FUSION/FORAMINOTOMY N/A 06/27/2015   Procedure: Posterior Cervical Fusion with lateral mass fixation Cervical four to cervical seven;  Surgeon: Tia Alert, MD;  Location: [facility]UR[facility] ORS;  Service: Neurosurgery;  Laterality: N/A;   TOTAL KNEE ARTHROPLASTY  2001   Left   TURP VAPORIZATION      reports that he quit smoking about 38 years ago. His smoking use included cigarettes. He has a 31.00 pack-year smoking  history. He has never been exposed to tobacco smoke. He has never used smokeless tobacco. He reports that he does not drink alcohol and does not use drugs. family history includes Diabetes Mellitus II in his mother; Lung cancer in his brother. Allergies  Allergen Reactions   Amlodipine Swelling   Cymbalta [Duloxetine Hcl] Other (See Comments)   Oxycodone Other (See Comments)   Current Outpatient Medications on File Prior to Visit  Medication Sig Dispense Refill   albuterol (VENTOLIN HFA) 108 (90 Base) MCG/ACT inhaler INHALE 2 PUFFS BY MOUTH EVERY 6 HOURS AS NEEDED FOR WHEEZING FOR SHORTNESS OF BREATH 18 g 3   allopurinol (ZYLOPRIM) 100 MG tablet Take 1 tablet (100 mg total) by mouth daily. 90 tablet 1   atorvastatin (LIPITOR) 40 MG tablet Take 1 tablet (40 mg total) by mouth daily. 90 tablet 3   carvedilol (COREG) 12.5 MG tablet TAKE 1 TABLET BY MOUTH TWICE  DAILY WITH MEALS 180 tablet 3   doxazosin (CARDURA) 2 MG tablet Take 1 tablet (2 mg total) by mouth daily. 90 tablet 3   finasteride (PROSCAR) 5 MG tablet Take 5 mg by mouth daily.     fluticasone furoate-vilanterol (BREO ELLIPTA) 200-25 MCG/ACT AEPB Inhale 1  puff into the lungs daily. 180 each 3   furosemide (LASIX) 20 MG tablet Take 20 mg by mouth daily.     gabapentin (NEURONTIN) 100 MG capsule Take 100 mg by mouth 3 (three) times daily.     glimepiride (AMARYL) 2 MG tablet TAKE 2 TABLETS BY MOUTH DAILY  BEFORE BREAKFAST 180 tablet 3   levothyroxine (SYNTHROID) 25 MCG tablet Take 1 tablet (25 mcg total) by mouth daily. 90 tablet 3   losartan (COZAAR) 100 MG tablet TAKE 1 TABLET BY MOUTH DAILY 90 tablet 3   Multiple Vitamins-Minerals (MULTIVITAMIN PO) Take 1 tablet by mouth daily.     pantoprazole (PROTONIX) 40 MG tablet Take 1 tablet (40 mg total) by mouth daily. 90 tablet 3   PAXIL 10 MG tablet Take 10 mg by mouth daily.     pioglitazone (ACTOS) 15 MG tablet TAKE 1 TABLET BY MOUTH DAILY 90 tablet 3   predniSONE (DELTASONE) 20 MG  tablet Take 1 tablet (20 mg total) by mouth daily with breakfast. 5 tablet 0   tamsulosin (FLOMAX) 0.4 MG CAPS capsule Take 1 capsule (0.4 mg total) by mouth daily. 90 capsule 3   tiZANidine (ZANAFLEX) 2 MG tablet Take 1-2 tablets (2-4 mg total) by mouth at bedtime as needed for muscle spasms. 60 tablet 1   aspirin 81 MG EC tablet Take 81 mg by mouth daily. (Patient not taking: Reported on 08/07/2022)     No current facility-administered medications on file prior to visit.        ROS:  All others reviewed and negative.  Objective        PE:  BP 120/76 (BP Location: Right Arm, Patient Position: Sitting, Cuff Size: Normal)   Pulse (!) 47   Temp 97.9 F (36.6 C) (Oral)   Ht 6\' 2"  (1.88 m)   Wt 197 lb (89.4 kg)   SpO2 98%   BMI 25.29 kg/m                 Constitutional: Pt appears in NAD               HENT: Head: NCAT.                Right Ear: External ear normal.                 Left Ear: External ear normal.                Eyes: . Pupils are equal, round, and reactive to light. Conjunctivae and EOM are normal               Nose: without d/c or deformity               Neck: Neck supple. Gross normal ROM               Cardiovascular: Normal rate and regular rhythm.                 Pulmonary/Chest: Effort normal and breath sounds without rales or wheezing.                Abd:  Soft, NT, ND, + BS, no organomegaly               Neurological: Pt is alert. At baseline orientation, motor grossly intact               Skin: Skin is warm. No rashes, no other new lesions, LE edema - ***  Psychiatric: Pt behavior is normal without agitation   Micro: none  Cardiac tracings I have personally interpreted today:  none  Pertinent Radiological findings (summarize): none   Lab Results  Component Value Date   WBC 4.6 08/03/2022   HGB 12.4 (L) 08/03/2022   HCT 38.6 (L) 08/03/2022   PLT 124 (L) 08/03/2022   GLUCOSE 162 (H) 04/22/2022   CHOL 145 04/22/2022   TRIG 64.0 04/22/2022    HDL 54.70 04/22/2022   LDLCALC 78 04/22/2022   ALT 15 04/22/2022   AST 21 04/22/2022   NA 140 04/22/2022   K 4.1 04/22/2022   CL 106 04/22/2022   CREATININE 2.01 (H) 04/22/2022   BUN 49 (H) 04/22/2022   CO2 26 04/22/2022   TSH 10.65 (H) 04/22/2022   PSA 4.61 (H) 04/09/2017   INR 1.12 06/19/2015   HGBA1C 7.5 (H) 04/22/2022   MICROALBUR 0.9 04/22/2022   Assessment/Plan:  Nicholas Patton is a 88 y.o. Black or African American [2] male with  has a past medical history of Anemia, Arthritis, Cataract, Colon polyps, COPD (chronic obstructive pulmonary disease) (HCC), DIABETES MELLITUS, TYPE II, Diverticulosis, GERD (gastroesophageal reflux disease), History of colon polyps, HYPERLIPIDEMIA, HYPERTENSION, Ileus following gastrointestinal surgery (HCC) (07/22/2012), Joint pain, Peripheral neuropathy, Peripheral neuropathy, PERIPHERAL NEUROPATHY, FEET (10/06/2007), Renal disorder, VITAMIN B12 DEFICIENCY (09/06/2008), and VITAMIN D DEFICIENCY (01/16/2010).  No problem-specific Assessment & Plan notes found for this encounter.  Followup: No follow-ups on file.  Oliver Barre, MD 08/07/2022 2:26 PM [facility]  Primary Care - [facility] Internal Medicine

## 2022-08-07 NOTE — Telephone Encounter (Signed)
Transition Care Management Unsuccessful Follow-up Telephone Call  Date of discharge and from where:  [facility] 5/27  Attempts:  1st Attempt  Reason for unsuccessful TCM follow-up call:  Left voice message   Bruno [facility], [place] [phone] [address], [place],  [address] Phone: [phone] Email: [email or web address]      

## 2022-08-07 NOTE — Patient Instructions (Signed)
Ok to continue off the aspirin  Please check BP at home on regular basis, with a new cuff and machine  Ok to use Allegra 180 mg per day as needed for allergies and cough  Please continue all other medications as before, and refills have been done if requested.  Please have the pharmacy call with any other refills you may need.  Please keep your appointments with your specialists as you may have planned- ENT as planned  We can plan to repeat your lab testing at your next visit

## 2022-08-09 ENCOUNTER — Encounter: Payer: Self-pay | Admitting: Internal Medicine

## 2022-08-09 DIAGNOSIS — D696 Thrombocytopenia, unspecified: Secondary | ICD-10-CM | POA: Insufficient documentation

## 2022-08-09 DIAGNOSIS — R040 Epistaxis: Secondary | ICD-10-CM | POA: Insufficient documentation

## 2022-08-09 NOTE — Assessment & Plan Note (Signed)
Right sided this time s/p cautery x 2 per ENT with asa stopped, no further bleeding recently,  to f/u any worsening symptoms or concerns

## 2022-08-09 NOTE — Assessment & Plan Note (Signed)
Mild recent worsening, for add allegra otc 180 mg qd prn

## 2022-08-09 NOTE — Assessment & Plan Note (Signed)
Mild worsening with nosebleed, cont to follow Lab Results  Component Value Date   WBC 4.6 08/03/2022   HGB 12.4 (L) 08/03/2022   HCT 38.6 (L) 08/03/2022   MCV 96.7 08/03/2022   PLT 124 (L) 08/03/2022

## 2022-08-09 NOTE — Assessment & Plan Note (Signed)
Lab Results  Component Value Date   PLT 124 (L) 08/03/2022  Mildly low over baseline, likely consumptive, no further recent bruising or bleeding,  to f/u any worsening symptoms or concerns

## 2022-08-09 NOTE — Assessment & Plan Note (Signed)
BP Readings from Last 3 Encounters:  08/07/22 120/76  08/03/22 (!) 164/86  06/25/22 (!) 154/74   Stable, pt to continue medical treatment coreg 12.5 bid, losartan 100 qd

## 2022-08-22 ENCOUNTER — Other Ambulatory Visit: Payer: Self-pay | Admitting: Pulmonary Disease

## 2022-08-25 ENCOUNTER — Other Ambulatory Visit: Payer: Self-pay | Admitting: Pulmonary Disease

## 2022-09-01 DIAGNOSIS — R040 Epistaxis: Secondary | ICD-10-CM | POA: Diagnosis not present

## 2022-09-08 DIAGNOSIS — E119 Type 2 diabetes mellitus without complications: Secondary | ICD-10-CM | POA: Diagnosis not present

## 2022-09-08 DIAGNOSIS — H16223 Keratoconjunctivitis sicca, not specified as Sjogren's, bilateral: Secondary | ICD-10-CM | POA: Diagnosis not present

## 2022-09-08 DIAGNOSIS — H11823 Conjunctivochalasis, bilateral: Secondary | ICD-10-CM | POA: Diagnosis not present

## 2022-09-08 DIAGNOSIS — H4322 Crystalline deposits in vitreous body, left eye: Secondary | ICD-10-CM | POA: Diagnosis not present

## 2022-09-08 DIAGNOSIS — Z961 Presence of intraocular lens: Secondary | ICD-10-CM | POA: Diagnosis not present

## 2022-09-08 DIAGNOSIS — H40013 Open angle with borderline findings, low risk, bilateral: Secondary | ICD-10-CM | POA: Diagnosis not present

## 2022-09-08 DIAGNOSIS — H43823 Vitreomacular adhesion, bilateral: Secondary | ICD-10-CM | POA: Diagnosis not present

## 2022-09-15 DIAGNOSIS — L298 Other pruritus: Secondary | ICD-10-CM | POA: Diagnosis not present

## 2022-09-15 DIAGNOSIS — R202 Paresthesia of skin: Secondary | ICD-10-CM | POA: Diagnosis not present

## 2022-09-17 DIAGNOSIS — R040 Epistaxis: Secondary | ICD-10-CM | POA: Diagnosis not present

## 2022-09-22 ENCOUNTER — Encounter: Payer: Self-pay | Admitting: Pulmonary Disease

## 2022-09-22 ENCOUNTER — Ambulatory Visit: Payer: Medicare Other | Admitting: Pulmonary Disease

## 2022-09-22 VITALS — BP 128/64 | HR 72 | Ht 74.0 in | Wt 194.8 lb

## 2022-09-22 DIAGNOSIS — H539 Unspecified visual disturbance: Secondary | ICD-10-CM | POA: Diagnosis not present

## 2022-09-22 DIAGNOSIS — J4540 Moderate persistent asthma, uncomplicated: Secondary | ICD-10-CM

## 2022-09-22 DIAGNOSIS — R0609 Other forms of dyspnea: Secondary | ICD-10-CM | POA: Diagnosis not present

## 2022-09-22 MED ORDER — PREDNISONE 20 MG PO TABS
ORAL_TABLET | ORAL | 0 refills | Status: AC
Start: 2022-09-22 — End: 2022-10-02

## 2022-09-22 NOTE — Patient Instructions (Addendum)
Nice to see you again  I sent a referral to a cardiologist to evaluate other causes of shortness of breath within the lungs and asthma   Referral to the eye doctor given your vision changes  Try prednisone 40 mg for 5 days then 20 mg for 5 days then stop.  No changes to inhalers.  Return to clinic in 6 months or sooner as needed with Dr. Judeth Horn

## 2022-09-22 NOTE — Progress Notes (Signed)
Patient ID: Nicholas Patton, male    DOB: 08-19-1934, 88 y.o.   MRN: [id number]  Chief Complaint  Patient presents with   Follow-up    6 mo f/u for DOE. States he is still having episodes of DOE. Productive cough with clear phlegm.     Referring provider: Corwin Levins, MD  HPI:   Nicholas Patton is a 88 y.o. man with reported "COPD" with spirometry without fixed obstruction but with significant bronchodilator response whom we are seeing in follow up for asthma, DOE.    Returns for scheduled follow-up. Prior CT high-resolution i reveals no ILD but significant emphysematous changes.  We discussed at length his ongoing shortness of breath.  PFT in the past with bronchodilator response.  Continues Breo.  Ongoing shortness of breath.  Discussed possible nonpulmonary etiologies and referral to cardiology.  Again discussed pulmonary hypertension the poor candidacy for therapies based on emphysema on CT scan.  Plan for some vision changes.  New referral to ophthalmology today as well.  HPI at initial visit: Has had some mild dyspnea exertion going on some time now.  At least months to years.  He has been told he has COPD in the past but has never had spirometry.  Shortness of breath worse walking longer distances or on inclines.  Rest resolve symptoms.  No real shortness of breath at rest.  He has hayfever, seasonal allergies regularly.  Will use medicines to treat this when bad.  No recurrent pneumonias in the past no real bad bouts of bronchitis.  No asthma or exertional limitation as a child.  No syncope or presyncope.  Reportedly has occasionally received prednisone for some breathing difficulties in the past.  Is been at least 2 years or more since last use.  Spirometry recently demonstrates ratio at 68 to 69% but given his age this is over 90% predicted and within normal limits.  Significant bronchodilator response in both FEV1 and FVC.  CT scan 05/2019 personally reviewed and interpreted as  diffuse significant upper lobe predominant emphysema without other significant abnormality.  PMH: Diabetes, hypertension Surgical history: Cataracts, neck surgery/fusion next knee replacement, cholecystectomy Family history: No history of lung cancer Social history: Former smoker, quit late 80s, lives in [place] / Pulmonary Flowsheets:   ACT:      No data to display          MMRC: mMRC Dyspnea Scale mMRC Score  11/18/2020  4:28 PM 3  05/15/2020  1:35 PM 0    Epworth:      No data to display          Tests:   FENO:  No results found for: "NITRICOXIDE"  PFT:    Latest Ref Rng & Units 10/04/2019   10:53 AM  PFT Results  FVC-Pre L 3.21  P  FVC-Predicted Pre % 78  P  FVC-Post L 3.67  P  FVC-Predicted Post % 90  P  Pre FEV1/FVC % % 68  P  Post FEV1/FCV % % 69  P  FEV1-Pre L 2.18  P  FEV1-Predicted Pre % 73  P  FEV1-Post L 2.55  P  DLCO uncorrected ml/min/mmHg 12.86  P  DLCO UNC% % 49  P  DLCO corrected ml/min/mmHg 12.94  P  DLCO COR %Predicted % 49  P  DLVA Predicted % 50  P  TLC L 7.34  P  TLC % Predicted % 95  P  RV % Predicted % 120  P  P Preliminary result  Personally reviewed and interpreted as normal spirometry with significant bronchodilator response.  TLC within normal limits, RV gas trapping.  DLCO severely reduced.  WALK:      No data to display          Imaging: No results found.  Lab Results:  CBC    Component Value Date/Time   WBC 4.6 08/03/2022 1408   RBC 3.99 (L) 08/03/2022 1408   HGB 12.4 (L) 08/03/2022 1408   HGB 13.0 10/30/2021 1007   HCT 38.6 (L) 08/03/2022 1408   PLT 124 (L) 08/03/2022 1408   PLT 146 (L) 10/30/2021 1007   MCV 96.7 08/03/2022 1408   MCH 31.1 08/03/2022 1408   MCHC 32.1 08/03/2022 1408   RDW 14.8 08/03/2022 1408   LYMPHSABS 1.0 08/03/2022 1408   MONOABS 0.5 08/03/2022 1408   EOSABS 0.1 08/03/2022 1408   BASOSABS 0.0 08/03/2022 1408    BMET    Component Value Date/Time   NA  140 04/22/2022 1142   K 4.1 04/22/2022 1142   CL 106 04/22/2022 1142   CO2 26 04/22/2022 1142   GLUCOSE 162 (H) 04/22/2022 1142   BUN 49 (H) 04/22/2022 1142   CREATININE 2.01 (H) 04/22/2022 1142   CREATININE 1.86 (H) 10/30/2021 1007   CREATININE 1.68 (H) 10/04/2019 1115   CALCIUM 10.6 (H) 04/22/2022 1142   GFRNONAA 35 (L) 10/30/2021 1007   GFRNONAA 37 (L) 10/04/2019 1115   GFRAA 41 (L) 10/25/2019 1052   GFRAA 43 (L) 10/04/2019 1115    BNP    Component Value Date/Time   BNP 48.5 05/18/2019 1918    ProBNP No results found for: "PROBNP"  Specialty Problems       Pulmonary Problems   Allergic rhinitis    Qualifier: Diagnosis of  By: Jonny Ruiz MD, Len Blalock       Acute upper respiratory infection   Dyspnea   Left-sided nosebleed   Asthma   Acute cough   Acute asthmatic bronchitis   COPD (chronic obstructive pulmonary disease) (HCC)   Nosebleed    Allergies  Allergen Reactions   Amlodipine Swelling   Cymbalta [Duloxetine Hcl] Other (See Comments)   Oxycodone Other (See Comments)    Immunization History  Administered Date(s) Administered   Fluad Quad(high Dose 65+) 11/09/2018, 11/30/2019, 12/14/2020, 04/22/2022   Influenza Whole 11/21/2015   Influenza, High Dose Seasonal PF 01/24/2013, 12/01/2016, 11/22/2017   Influenza,inj,Quad PF,6+ Mos 12/08/2013   Influenza-Unspecified 02/08/2015   PFIZER(Purple Top)SARS-COV-2 Vaccination 05/03/2019, 05/04/2019, 12/23/2019, 10/10/2020   Pneumococcal Conjugate-13 03/22/2013   Pneumococcal Polysaccharide-23 01/10/2016   Td 10/05/2014   Zoster Recombinant(Shingrix) 07/25/2021, 01/19/2022    Past Medical History:  Diagnosis Date   Anemia    Arthritis    Cataract    REMOVED BILATERAL   Colon polyps    COPD (chronic obstructive pulmonary disease) (HCC)    DIABETES MELLITUS, TYPE II    takes Metformin and Glimepiride daily   Diverticulosis    GERD (gastroesophageal reflux disease)    takes Protonix daily   History of  colon polyps    HYPERLIPIDEMIA    takes Atorvastatin daily   HYPERTENSION    takes Amlodipine and Hyzaar daily   Ileus following gastrointestinal surgery (HCC) 07/22/2012   Joint pain    Peripheral neuropathy    Peripheral neuropathy    PERIPHERAL NEUROPATHY, FEET 10/06/2007   Renal disorder    VITAMIN B12 DEFICIENCY 09/06/2008   VITAMIN D DEFICIENCY 01/16/2010   takes Vitamin D  daily    Tobacco History: Social History   Tobacco Use  Smoking Status Former   Current packs/day: 0.00   Average packs/day: 1 pack/day for 31.0 years (31.0 ttl pk-yrs)   Types: Cigarettes   Start date: 12/22/1952   Quit date: 12/23/1983   Years since quitting: 38.7   Passive exposure: Never  Smokeless Tobacco Never   Counseling given: Not Answered    Continue to not smoke  Outpatient Encounter Medications as of 09/22/2022  Medication Sig   albuterol (VENTOLIN HFA) 108 (90 Base) MCG/ACT inhaler INHALE 2 PUFFS BY MOUTH EVERY 6 HOURS AS NEEDED FOR WHEEZING FOR SHORTNESS OF BREATH   allopurinol (ZYLOPRIM) 100 MG tablet Take 1 tablet (100 mg total) by mouth daily.   atorvastatin (LIPITOR) 40 MG tablet Take 1 tablet (40 mg total) by mouth daily.   carvedilol (COREG) 12.5 MG tablet TAKE 1 TABLET BY MOUTH TWICE  DAILY WITH MEALS   doxazosin (CARDURA) 2 MG tablet Take 1 tablet (2 mg total) by mouth daily.   finasteride (PROSCAR) 5 MG tablet Take 5 mg by mouth daily.   fluticasone furoate-vilanterol (BREO ELLIPTA) 200-25 MCG/ACT AEPB Inhale 1 puff into the lungs daily.   furosemide (LASIX) 20 MG tablet Take 20 mg by mouth daily.   gabapentin (NEURONTIN) 100 MG capsule Take 100 mg by mouth 3 (three) times daily.   glimepiride (AMARYL) 2 MG tablet TAKE 2 TABLETS BY MOUTH DAILY  BEFORE BREAKFAST   levothyroxine (SYNTHROID) 25 MCG tablet Take 1 tablet (25 mcg total) by mouth daily.   losartan (COZAAR) 100 MG tablet TAKE 1 TABLET BY MOUTH DAILY   Multiple Vitamins-Minerals (MULTIVITAMIN PO) Take 1 tablet by  mouth daily.   pantoprazole (PROTONIX) 40 MG tablet Take 1 tablet (40 mg total) by mouth daily.   PAXIL 10 MG tablet Take 10 mg by mouth daily.   pioglitazone (ACTOS) 15 MG tablet TAKE 1 TABLET BY MOUTH DAILY   predniSONE (DELTASONE) 20 MG tablet Take 2 tablets (40 mg total) by mouth daily with breakfast for 5 days, THEN 1 tablet (20 mg total) daily with breakfast for 5 days.   tamsulosin (FLOMAX) 0.4 MG CAPS capsule Take 1 capsule (0.4 mg total) by mouth daily.   tiZANidine (ZANAFLEX) 2 MG tablet Take 1-2 tablets (2-4 mg total) by mouth at bedtime as needed for muscle spasms.   [DISCONTINUED] aspirin 81 MG EC tablet Take 81 mg by mouth daily. (Patient not taking: Reported on 08/07/2022)   [DISCONTINUED] predniSONE (DELTASONE) 20 MG tablet Take 1 tablet (20 mg total) by mouth daily with breakfast.   No facility-administered encounter medications on file as of 09/22/2022.     Review of Systems  Review of Systems  N/a Physical Exam  BP 128/64   Pulse 72   Ht 6\' 2"  (1.88 m)   Wt 194 lb 12.8 oz (88.4 kg)   SpO2 98% Comment: on RA  BMI 25.01 kg/m   Wt Readings from Last 5 Encounters:  09/22/22 194 lb 12.8 oz (88.4 kg)  08/07/22 197 lb (89.4 kg)  08/03/22 192 lb (87.1 kg)  06/25/22 196 lb (88.9 kg)  06/22/22 196 lb 3.2 oz (89 kg)    BMI Readings from Last 5 Encounters:  09/22/22 25.01 kg/m  08/07/22 25.29 kg/m  08/03/22 24.65 kg/m  06/25/22 25.16 kg/m  06/22/22 25.19 kg/m     Physical Exam General: Well-appearing, in no acute distress Eyes: EOMI, no icterus Neck: Supple, no JVP appreciated sitting upright Respiratory: Clear  to auscultation bilaterally, normal work of breathing on room air Extremities: Warm, no edema MSK: No synovitis, no joint effusions Neuro: Normal gait, no weakness Psych: Normal mood, full affect   Assessment & Plan:   ZEV BLUE is a 88 y.o. man with reported COPD with recent spirometry without fixed obstruction but with significant  bronchodilator response whom we are seeing for follow up for evaluation of DOE.  DOE: PFTs are suggestive of asthma and this is likely a driver of symptoms.  Some of deconditioning given he admits to being less active.  Improved with ICS/LABA therapy but still present. Additionally, DLCO is severely reduced which is related to emphysema seen on CT but also concern for contribution of pulmonary HTN given elevated PA pressures seen on TTE in 2016. Repeat TTE 10/2019 with mildly elevated PASP, RV enlargement but normal RV function and size.  Also developing element of deconditioning given his lack of activity in the preceding months.  Escalated to Trelegy fall 2022 without improvement.  Subsequent improvement after resuming Virgel Bouquet Winter 2022.  With what seemed to be less improvement over the last several months.  Escalated to Breztri follow-up 2023 with worsening symptoms.  Return to Trelegy.  CT high-resolution with emphysema, no other concerning findings.  Suspect his shortness of breath related to asthma/emphysema is being maximally treated currently.  In addition, likely pulmonary hypertension could be contribute.  However given his significant emphysema, lack of ILD, he is a poor candidate for systemic and inhaled pulmonary vasodilators.  Given very high likelihood of worsening decompensation with treatment and unlikely improvement, no further workup, diagnostic test for pulmonary hypertension are recommended.  Referral to cardiology for evaluation of possible cardiac contributors to dyspnea on exertion.  Asthma: Had improved with ICS/LABA.  Encouraged increased exercise.  No real improvement in symptoms with Trelegy and Breztri.  Back on Breo.  Trial prednisone taper, 40 mg for 5 days then 20 mg for 5 days then stop.   Return in about 6 months (around 03/25/2023).   Karren Burly, MD 09/22/2022

## 2022-09-27 ENCOUNTER — Other Ambulatory Visit: Payer: Self-pay | Admitting: Urology

## 2022-09-30 ENCOUNTER — Encounter: Payer: Self-pay | Admitting: Podiatry

## 2022-09-30 ENCOUNTER — Ambulatory Visit (INDEPENDENT_AMBULATORY_CARE_PROVIDER_SITE_OTHER): Payer: Medicare Other | Admitting: Podiatry

## 2022-09-30 DIAGNOSIS — E1142 Type 2 diabetes mellitus with diabetic polyneuropathy: Secondary | ICD-10-CM | POA: Diagnosis not present

## 2022-09-30 DIAGNOSIS — B351 Tinea unguium: Secondary | ICD-10-CM | POA: Diagnosis not present

## 2022-09-30 DIAGNOSIS — M79674 Pain in right toe(s): Secondary | ICD-10-CM

## 2022-09-30 DIAGNOSIS — L84 Corns and callosities: Secondary | ICD-10-CM

## 2022-09-30 DIAGNOSIS — M79675 Pain in left toe(s): Secondary | ICD-10-CM | POA: Diagnosis not present

## 2022-10-07 ENCOUNTER — Encounter (INDEPENDENT_AMBULATORY_CARE_PROVIDER_SITE_OTHER): Payer: Self-pay

## 2022-10-07 NOTE — Progress Notes (Signed)
  Subjective:  Patient ID: Nicholas Patton, male    DOB: [birth year],  MRN: [id number]  Nicholas Patton presents to clinic today for at risk foot care with history of diabetic neuropathy and corn(s) left foot and painful thick toenails that are difficult to trim. Painful toenails interfere with ambulation. Aggravating factors include wearing enclosed shoe gear. Pain is relieved with periodic professional debridement. Painful corns are aggravated when weightbearing when wearing enclosed shoe gear. Pain is relieved with periodic professional debridement.  Chief Complaint  Patient presents with   Diabetes    [facility] BS - 130 A1C - DK  LVPCP - 04/2022   New problem(s): None.   PCP is Nicholas Levins, MD.  Allergies  Allergen Reactions   Amlodipine Swelling   Cymbalta [Duloxetine Hcl] Other (See Comments)   Oxycodone Other (See Comments)    Review of Systems: Negative except as noted in the HPI.  Objective: No changes noted in today's physical examination. There were no vitals filed for this visit. Nicholas Patton is a pleasant 88 y.o. male WD, WN in NAD. AAO x 3.  Neurovascular Examination: Capillary refill time to digits immediate b/l. Palpable DP pulse(s) b/l LE. Palpable PT pulse(s) b/l LE. Pedal hair absent. Nonpitting edema noted right lower extremity.  Pt has subjective symptoms of neuropathy. Protective sensation intact 5/5 intact bilaterally with 10g monofilament b/l. Vibratory sensation intact b/l.  Dermatological:  Pedal skin is warm and supple b/l LE. No open wounds b/l LE. No interdigital macerations noted b/l LE. Toenails 1-5 b/l elongated, discolored, dystrophic, thickened, crumbly with subungual debris and tenderness to dorsal palpation.   Hyperkeratotic lesion(s) medial PIPJ of L 2nd toe. Resolved 1st metatarsal head right foot and interdigital hallux left foot. No erythema, no edema, no drainage, no fluctuance.  Musculoskeletal:  Muscle strength 5/5 to all lower extremity  muscle groups bilaterally. No pain, crepitus or joint limitation noted with ROM bilateral LE. HAV with bunion deformity noted b/l LE. Hammertoe deformity noted 2-5 b/l.  Assessment/Plan: 1. Pain due to onychomycosis of toenails of both feet   2. Corns   3. Diabetic peripheral neuropathy associated with type 2 diabetes mellitus (HCC)   -Consent given for treatment as described below: -Examined patient. -Continue foot and shoe inspections daily. Monitor blood glucose per PCP/Endocrinologist's recommendations. -Continue supportive shoe gear daily. -Toenails 1-5 b/l were debrided in length and girth with sterile nail nippers and dremel without iatrogenic bleeding.  -Corn(s) left second digit pared utilizing sterile scalpel blade without complication or incident. Total number debrided=1. -Patient/POA to call should there be question/concern in the interim.   Return in about 3 months (around 12/31/2022).  Nicholas Patton, DPM

## 2022-10-21 ENCOUNTER — Ambulatory Visit (INDEPENDENT_AMBULATORY_CARE_PROVIDER_SITE_OTHER): Payer: Medicare Other | Admitting: Internal Medicine

## 2022-10-21 ENCOUNTER — Other Ambulatory Visit: Payer: Self-pay | Admitting: Internal Medicine

## 2022-10-21 VITALS — BP 140/64 | HR 58 | Temp 97.7°F | Ht 74.0 in | Wt 194.0 lb

## 2022-10-21 DIAGNOSIS — N1832 Chronic kidney disease, stage 3b: Secondary | ICD-10-CM

## 2022-10-21 DIAGNOSIS — Z7984 Long term (current) use of oral hypoglycemic drugs: Secondary | ICD-10-CM | POA: Diagnosis not present

## 2022-10-21 DIAGNOSIS — E538 Deficiency of other specified B group vitamins: Secondary | ICD-10-CM

## 2022-10-21 DIAGNOSIS — I10 Essential (primary) hypertension: Secondary | ICD-10-CM

## 2022-10-21 DIAGNOSIS — E7800 Pure hypercholesterolemia, unspecified: Secondary | ICD-10-CM | POA: Diagnosis not present

## 2022-10-21 DIAGNOSIS — E039 Hypothyroidism, unspecified: Secondary | ICD-10-CM | POA: Diagnosis not present

## 2022-10-21 DIAGNOSIS — E1140 Type 2 diabetes mellitus with diabetic neuropathy, unspecified: Secondary | ICD-10-CM

## 2022-10-21 LAB — BASIC METABOLIC PANEL
BUN: 51 mg/dL — ABNORMAL HIGH (ref 6–23)
CO2: 27 mEq/L (ref 19–32)
Calcium: 10.1 mg/dL (ref 8.4–10.5)
Chloride: 108 mEq/L (ref 96–112)
Creatinine, Ser: 2.32 mg/dL — ABNORMAL HIGH (ref 0.40–1.50)
GFR: 24.87 mL/min — ABNORMAL LOW (ref >60.00)
Glucose, Bld: 174 mg/dL — ABNORMAL HIGH (ref 70–99)
Potassium: 3.8 mEq/L (ref 3.5–5.1)
Sodium: 142 mEq/L (ref 135–145)

## 2022-10-21 LAB — HEPATIC FUNCTION PANEL
ALT: 11 U/L (ref 0–53)
AST: 13 U/L (ref 0–37)
Albumin: 4.2 g/dL (ref 3.5–5.2)
Alkaline Phosphatase: 130 U/L — ABNORMAL HIGH (ref 39–117)
Bilirubin, Direct: 0.2 mg/dL (ref 0.0–0.3)
Total Bilirubin: 0.6 mg/dL (ref 0.2–1.2)
Total Protein: 7 g/dL (ref 6.0–8.3)

## 2022-10-21 LAB — CBC WITH DIFFERENTIAL/PLATELET
Basophils Absolute: 0 10*3/uL (ref 0.0–0.1)
Basophils Relative: 0.7 % (ref 0.0–3.0)
Eosinophils Absolute: 0.1 10*3/uL (ref 0.0–0.7)
Eosinophils Relative: 4.7 % (ref 0.0–5.0)
HCT: 41.4 % (ref 39.0–52.0)
Hemoglobin: 13.1 g/dL (ref 13.0–17.0)
Lymphocytes Relative: 29.3 % (ref 12.0–46.0)
Lymphs Abs: 0.9 10*3/uL (ref 0.7–4.0)
MCHC: 31.5 g/dL (ref 30.0–36.0)
MCV: 93.9 fl (ref 78.0–100.0)
Monocytes Absolute: 0.3 10*3/uL (ref 0.1–1.0)
Monocytes Relative: 9.1 % (ref 3.0–12.0)
Neutro Abs: 1.7 10*3/uL (ref 1.4–7.7)
Neutrophils Relative %: 56.2 % (ref 43.0–77.0)
Platelets: 162 10*3/uL (ref 150.0–400.0)
RBC: 4.41 Mil/uL (ref 4.22–5.81)
RDW: 15.6 % — ABNORMAL HIGH (ref 11.5–15.5)
WBC: 3.1 10*3/uL — ABNORMAL LOW (ref 4.0–10.5)

## 2022-10-21 LAB — T4, FREE: Free T4: 1.02 ng/dL (ref 0.60–1.60)

## 2022-10-21 LAB — LIPID PANEL
Cholesterol: 139 mg/dL (ref 0–200)
HDL: 50.8 mg/dL (ref >39.00)
LDL Cholesterol: 74 mg/dL (ref 0–99)
NonHDL: 87.97
Total CHOL/HDL Ratio: 3
Triglycerides: 69 mg/dL (ref 0.0–149.0)
VLDL: 13.8 mg/dL (ref 0.0–40.0)

## 2022-10-21 LAB — TSH: TSH: 6.89 u[IU]/mL — ABNORMAL HIGH (ref 0.35–5.50)

## 2022-10-21 LAB — HEMOGLOBIN A1C: Hgb A1c MFr Bld: 8.2 % — ABNORMAL HIGH (ref 4.6–6.5)

## 2022-10-21 MED ORDER — PIOGLITAZONE HCL 30 MG PO TABS: 30.0000 mg | ORAL_TABLET | Freq: Every day | ORAL | 3 refills | Status: DC

## 2022-10-21 MED ORDER — LEVOTHYROXINE SODIUM 50 MCG PO TABS: 50.0000 ug | ORAL_TABLET | Freq: Every day | ORAL | 3 refills | Status: DC

## 2022-10-21 NOTE — Progress Notes (Signed)
Patient ID: Nicholas Patton, male   DOB: Jul 25, 1934, 88 y.o.   MRN: [id number]        Chief Complaint: follow up low thyroid, low b12, ckd3a, dm, hld, htn       HPI:  Nicholas MICHNIEWICZ is a 88 y.o. male here overall doing ok  Pt denies chest pain, increased sob or doe, wheezing, orthopnea, PND, increased LE swelling, palpitations, dizziness or syncope.   Pt denies polydipsia, polyuria, or new focal neuro s/s.    Pt denies fever, wt loss, night sweats, loss of appetite, or other constitutional symptoms         Wt Readings from Last 3 Encounters:  10/21/22 194 lb (88 kg)  09/22/22 194 lb 12.8 oz (88.4 kg)  08/07/22 197 lb (89.4 kg)   BP Readings from Last 3 Encounters:  10/21/22 (!) 140/64  09/22/22 128/64  08/07/22 120/76         Past Medical History:  Diagnosis Date   Anemia    Arthritis    Cataract    REMOVED BILATERAL   Colon polyps    COPD (chronic obstructive pulmonary disease) (HCC)    DIABETES MELLITUS, TYPE II    takes Metformin and Glimepiride daily   Diverticulosis    GERD (gastroesophageal reflux disease)    takes Protonix daily   History of colon polyps    HYPERLIPIDEMIA    takes Atorvastatin daily   HYPERTENSION    takes Amlodipine and Hyzaar daily   Ileus following gastrointestinal surgery (HCC) 07/22/2012   Joint pain    Peripheral neuropathy    Peripheral neuropathy    PERIPHERAL NEUROPATHY, FEET 10/06/2007   Renal disorder    VITAMIN B12 DEFICIENCY 09/06/2008   VITAMIN D DEFICIENCY 01/16/2010   takes Vitamin D daily   Past Surgical History:  Procedure Laterality Date   CATARACT EXTRACTION     both eyes   CHOLECYSTECTOMY N/A 07/18/2012   Procedure: LAPAROSCOPIC CHOLECYSTECTOMY WITH INTRAOPERATIVE CHOLANGIOGRAM;  Surgeon: Liz Malady, MD;  Location: [facility] OR;  Service: General;  Laterality: N/A;   COLONOSCOPY     POLYPECTOMY     POSTERIOR CERVICAL FUSION/FORAMINOTOMY N/A 06/27/2015   Procedure: Posterior Cervical Fusion with lateral mass fixation  Cervical four to cervical seven;  Surgeon: Tia Alert, MD;  Location: [facility] NEURO ORS;  Service: Neurosurgery;  Laterality: N/A;   TOTAL KNEE ARTHROPLASTY  2001   Left   TURP VAPORIZATION      reports that he quit smoking about 38 years ago. His smoking use included cigarettes. He started smoking about 69 years ago. He has a 31 pack-year smoking history. He has never been exposed to tobacco smoke. He has never used smokeless tobacco. He reports that he does not drink alcohol and does not use drugs. family history includes Diabetes Mellitus II in his mother; Lung cancer in his brother. Allergies  Allergen Reactions   Amlodipine Swelling   Cymbalta [Duloxetine Hcl] Other (See Comments)   Oxycodone Other (See Comments)   Current Outpatient Medications on File Prior to Visit  Medication Sig Dispense Refill   albuterol (VENTOLIN HFA) 108 (90 Base) MCG/ACT inhaler INHALE 2 PUFFS BY MOUTH EVERY 6 HOURS AS NEEDED FOR WHEEZING FOR SHORTNESS OF BREATH 18 g 2   carvedilol (COREG) 12.5 MG tablet TAKE 1 TABLET BY MOUTH TWICE  DAILY WITH MEALS 180 tablet 3   doxazosin (CARDURA) 2 MG tablet Take 1 tablet (2 mg total) by mouth daily. 90 tablet 3  finasteride (PROSCAR) 5 MG tablet TAKE 1 TABLET BY MOUTH DAILY 90 tablet 3   fluticasone furoate-vilanterol (BREO ELLIPTA) 200-25 MCG/ACT AEPB Inhale 1 puff into the lungs daily. 180 each 3   furosemide (LASIX) 20 MG tablet Take 20 mg by mouth daily.     glimepiride (AMARYL) 2 MG tablet TAKE 2 TABLETS BY MOUTH DAILY  BEFORE BREAKFAST 180 tablet 3   losartan (COZAAR) 100 MG tablet TAKE 1 TABLET BY MOUTH DAILY 90 tablet 3   pantoprazole (PROTONIX) 40 MG tablet Take 1 tablet (40 mg total) by mouth daily. 90 tablet 3   allopurinol (ZYLOPRIM) 100 MG tablet Take 1 tablet (100 mg total) by mouth daily. 90 tablet 1   atorvastatin (LIPITOR) 40 MG tablet Take 1 tablet (40 mg total) by mouth daily. 90 tablet 3   gabapentin (NEURONTIN) 100 MG capsule Take 100 mg by mouth 3  (three) times daily. (Patient not taking: Reported on 10/21/2022)     Multiple Vitamins-Minerals (MULTIVITAMIN PO) Take 1 tablet by mouth daily. (Patient not taking: Reported on 10/21/2022)     PAXIL 10 MG tablet Take 10 mg by mouth daily. (Patient not taking: Reported on 10/21/2022)     tamsulosin (FLOMAX) 0.4 MG CAPS capsule Take 1 capsule (0.4 mg total) by mouth daily. (Patient not taking: Reported on 10/21/2022) 90 capsule 3   tiZANidine (ZANAFLEX) 2 MG tablet Take 1-2 tablets (2-4 mg total) by mouth at bedtime as needed for muscle spasms. (Patient not taking: Reported on 10/21/2022) 60 tablet 1   No current facility-administered medications on file prior to visit.        ROS:  All others reviewed and negative.  Objective        PE:  BP (!) 140/64 (BP Location: Left Arm, Patient Position: Sitting, Cuff Size: Normal)   Pulse (!) 58   Temp 97.7 F (36.5 C) (Oral)   Ht 6\' 2"  (1.88 m)   Wt 194 lb (88 kg)   SpO2 94%   BMI 24.91 kg/m                 Constitutional: Pt appears in NAD               HENT: Head: NCAT.                Right Ear: External ear normal.                 Left Ear: External ear normal.                Eyes: . Pupils are equal, round, and reactive to light. Conjunctivae and EOM are normal               Nose: without d/c or deformity               Neck: Neck supple. Gross normal ROM               Cardiovascular: Normal rate and regular rhythm.                 Pulmonary/Chest: Effort normal and breath sounds without rales or wheezing.                Abd:  Soft, NT, ND, + BS, no organomegaly               Neurological: Pt is alert. At baseline orientation, motor grossly intact  Skin: Skin is warm. No rashes, no other new lesions, LE edema - none               Psychiatric: Pt behavior is normal without agitation   Micro: none  Cardiac tracings I have personally interpreted today:  none  Pertinent Radiological findings (summarize): none   Lab Results   Component Value Date   WBC 3.1 (L) 10/21/2022   HGB 13.1 10/21/2022   HCT 41.4 10/21/2022   PLT 162.0 10/21/2022   GLUCOSE 174 (H) 10/21/2022   CHOL 139 10/21/2022   TRIG 69.0 10/21/2022   HDL 50.80 10/21/2022   LDLCALC 74 10/21/2022   ALT 11 10/21/2022   AST 13 10/21/2022   NA 142 10/21/2022   K 3.8 10/21/2022   CL 108 10/21/2022   CREATININE 2.32 (H) 10/21/2022   BUN 51 (H) 10/21/2022   CO2 27 10/21/2022   TSH 6.89 (H) 10/21/2022   PSA 4.61 (H) 04/09/2017   INR 1.12 06/19/2015   HGBA1C 8.2 (H) 10/21/2022   MICROALBUR 0.9 04/22/2022   Assessment/Plan:  Nicholas Patton is a 88 y.o. Black or African American [2] male with  has a past medical history of Anemia, Arthritis, Cataract, Colon polyps, COPD (chronic obstructive pulmonary disease) (HCC), DIABETES MELLITUS, TYPE II, Diverticulosis, GERD (gastroesophageal reflux disease), History of colon polyps, HYPERLIPIDEMIA, HYPERTENSION, Ileus following gastrointestinal surgery (HCC) (07/22/2012), Joint pain, Peripheral neuropathy, Peripheral neuropathy, PERIPHERAL NEUROPATHY, FEET (10/06/2007), Renal disorder, VITAMIN B12 DEFICIENCY (09/06/2008), and VITAMIN D DEFICIENCY (01/16/2010).  Diabetes Lab Results  Component Value Date   HGBA1C 8.2 (H) 10/21/2022   uncontrolled, goal A1c < 7.5, pt to continue current medical treatment glimeperide 2 tab qam, and increased actos 30 qd   B12 deficiency Lab Results  Component Value Date   VITAMINB12 228 04/22/2022   Low, to start oral replacement - b12 1000 mcg qd   Hyperlipidemia Lab Results  Component Value Date   LDLCALC 74 10/21/2022   Uncontrolled, goal ldl < 70, pt to continue current statin lipitor 40 mg, decliens other change   Essential hypertension BP Readings from Last 3 Encounters:  10/21/22 (!) 140/64  09/22/22 128/64  08/07/22 120/76   uncontrolled, pt to continue medical treatment coreg 12.5 bid, losartan 100 every day, declines other change for now   CKD  (chronic kidney disease) stage 3, GFR 30-59 ml/min Lab Results  Component Value Date   CREATININE 2.32 (H) 10/21/2022   Stable overall, cont to avoid nephrotoxins, to f/u renal as planned  Hypothyroid Lab Results  Component Value Date   TSH 6.89 (H) 10/21/2022   Improved but still uncontrolled, pt to increase levothryoxine 50 mcg  Followup: Return in about 6 months (around 04/23/2023).  Oliver Barre, MD 10/24/2022 8:12 PM  [facility] - [facility]

## 2022-10-21 NOTE — Patient Instructions (Signed)
Please take all new medication as prescribed - the OTC B12 1000 mcg per day  Please continue all other medications as before, and refills have been done if requested.  Please have the pharmacy call with any other refills you may need.  Please continue your efforts at being more active, low cholesterol diet, and weight control.  Please keep your appointments with your specialists as you may have planned  Please go to the LAB at the blood drawing area for the tests to be done  You will be contacted by phone if any changes need to be made immediately.  Otherwise, you will receive a letter about your results with an explanation, but please check with MyChart first.  Please remember to sign up for MyChart if you have not done so, as this will be important to you in the future with finding out test results, communicating by private email, and scheduling acute appointments online when needed.  Please make an Appointment to return in 6 months, or sooner if needed

## 2022-10-22 ENCOUNTER — Encounter (INDEPENDENT_AMBULATORY_CARE_PROVIDER_SITE_OTHER): Payer: Self-pay

## 2022-10-24 ENCOUNTER — Encounter: Payer: Self-pay | Admitting: Internal Medicine

## 2022-10-24 NOTE — Assessment & Plan Note (Signed)
Lab Results  Component Value Date   TSH 6.89 (H) 10/21/2022   Improved but still uncontrolled, pt to increase levothryoxine 50 mcg

## 2022-10-24 NOTE — Assessment & Plan Note (Signed)
BP Readings from Last 3 Encounters:  10/21/22 (!) 140/64  09/22/22 128/64  08/07/22 120/76   uncontrolled, pt to continue medical treatment coreg 12.5 bid, losartan 100 every day, declines other change for now

## 2022-10-24 NOTE — Assessment & Plan Note (Signed)
Lab Results  Component Value Date   LDLCALC 74 10/21/2022   Uncontrolled, goal ldl < 70, pt to continue current statin lipitor 40 mg, decliens other change

## 2022-10-24 NOTE — Assessment & Plan Note (Signed)
Lab Results  Component Value Date   HGBA1C 8.2 (H) 10/21/2022   uncontrolled, goal A1c < 7.5, pt to continue current medical treatment glimeperide 2 tab qam, and increased actos 30 qd

## 2022-10-24 NOTE — Assessment & Plan Note (Signed)
Lab Results  Component Value Date   CREATININE 2.32 (H) 10/21/2022   Stable overall, cont to avoid nephrotoxins, to f/u renal as planned

## 2022-10-24 NOTE — Assessment & Plan Note (Signed)
Lab Results  Component Value Date   VITAMINB12 228 04/22/2022   Low, to start oral replacement - b12 1000 mcg qd

## 2022-10-26 DIAGNOSIS — R972 Elevated prostate specific antigen [PSA]: Secondary | ICD-10-CM | POA: Diagnosis not present

## 2022-10-27 DIAGNOSIS — H43821 Vitreomacular adhesion, right eye: Secondary | ICD-10-CM | POA: Diagnosis not present

## 2022-10-27 DIAGNOSIS — H35371 Puckering of macula, right eye: Secondary | ICD-10-CM | POA: Diagnosis not present

## 2022-10-27 DIAGNOSIS — E119 Type 2 diabetes mellitus without complications: Secondary | ICD-10-CM | POA: Diagnosis not present

## 2022-10-27 LAB — HM DIABETES EYE EXAM

## 2022-11-02 DIAGNOSIS — R310 Gross hematuria: Secondary | ICD-10-CM | POA: Diagnosis not present

## 2022-11-02 DIAGNOSIS — R351 Nocturia: Secondary | ICD-10-CM | POA: Diagnosis not present

## 2022-11-02 DIAGNOSIS — N401 Enlarged prostate with lower urinary tract symptoms: Secondary | ICD-10-CM | POA: Diagnosis not present

## 2022-11-02 DIAGNOSIS — R972 Elevated prostate specific antigen [PSA]: Secondary | ICD-10-CM | POA: Diagnosis not present

## 2022-11-10 ENCOUNTER — Inpatient Hospital Stay: Payer: Medicare Other | Attending: Hematology and Oncology

## 2022-11-10 DIAGNOSIS — D472 Monoclonal gammopathy: Secondary | ICD-10-CM | POA: Insufficient documentation

## 2022-11-10 LAB — CBC WITH DIFFERENTIAL (CANCER CENTER ONLY)
Abs Immature Granulocytes: 0.05 10*3/uL (ref 0.00–0.07)
Basophils Absolute: 0 10*3/uL (ref 0.0–0.1)
Basophils Relative: 1 %
Eosinophils Absolute: 0.1 10*3/uL (ref 0.0–0.5)
Eosinophils Relative: 3 %
HCT: 40.6 % (ref 39.0–52.0)
Hemoglobin: 12.9 g/dL — ABNORMAL LOW (ref 13.0–17.0)
Immature Granulocytes: 1 %
Lymphocytes Relative: 24 %
Lymphs Abs: 1.1 10*3/uL (ref 0.7–4.0)
MCH: 29.7 pg (ref 26.0–34.0)
MCHC: 31.8 g/dL (ref 30.0–36.0)
MCV: 93.5 fL (ref 80.0–100.0)
Monocytes Absolute: 0.5 10*3/uL (ref 0.1–1.0)
Monocytes Relative: 11 %
Neutro Abs: 2.7 10*3/uL (ref 1.7–7.7)
Neutrophils Relative %: 60 %
Platelet Count: 143 10*3/uL — ABNORMAL LOW (ref 150–400)
RBC: 4.34 MIL/uL (ref 4.22–5.81)
RDW: 14.9 % (ref 11.5–15.5)
WBC Count: 4.5 10*3/uL (ref 4.0–10.5)
nRBC: 0 % (ref 0.0–0.2)

## 2022-11-10 LAB — CMP (CANCER CENTER ONLY)
ALT: 10 U/L (ref 0–44)
AST: 15 U/L (ref 15–41)
Albumin: 4.2 g/dL (ref 3.5–5.0)
Alkaline Phosphatase: 114 U/L (ref 38–126)
Anion gap: 9 (ref 5–15)
BUN: 54 mg/dL — ABNORMAL HIGH (ref 8–23)
CO2: 26 mmol/L (ref 22–32)
Calcium: 10.1 mg/dL (ref 8.9–10.3)
Chloride: 107 mmol/L (ref 98–111)
Creatinine: 2.27 mg/dL — ABNORMAL HIGH (ref 0.61–1.24)
GFR, Estimated: 27 mL/min — ABNORMAL LOW (ref >60)
Glucose, Bld: 192 mg/dL — ABNORMAL HIGH (ref 70–99)
Potassium: 3.9 mmol/L (ref 3.5–5.1)
Sodium: 142 mmol/L (ref 135–145)
Total Bilirubin: 0.7 mg/dL (ref 0.3–1.2)
Total Protein: 7.1 g/dL (ref 6.5–8.1)

## 2022-11-11 LAB — BETA 2 MICROGLOBULIN, SERUM: Beta-2 Microglobulin: 4 mg/L — ABNORMAL HIGH (ref 0.6–2.4)

## 2022-11-11 LAB — KAPPA/LAMBDA LIGHT CHAINS
Kappa free light chain: 3130.6 mg/L — ABNORMAL HIGH (ref 3.3–19.4)
Kappa, lambda light chain ratio: 200.68 — ABNORMAL HIGH (ref 0.26–1.65)
Lambda free light chains: 15.6 mg/L (ref 5.7–26.3)

## 2022-11-13 LAB — MULTIPLE MYELOMA PANEL, SERUM
Albumin SerPl Elph-Mcnc: 3.4 g/dL (ref 2.9–4.4)
Albumin/Glob SerPl: 1.3 (ref 0.7–1.7)
Alpha 1: 0.3 g/dL (ref 0.0–0.4)
Alpha2 Glob SerPl Elph-Mcnc: 0.8 g/dL (ref 0.4–1.0)
B-Globulin SerPl Elph-Mcnc: 1 g/dL (ref 0.7–1.3)
Gamma Glob SerPl Elph-Mcnc: 0.6 g/dL (ref 0.4–1.8)
Globulin, Total: 2.8 g/dL (ref 2.2–3.9)
IgA: 263 mg/dL (ref 61–437)
IgG (Immunoglobin G), Serum: 580 mg/dL — ABNORMAL LOW (ref 603–1613)
IgM (Immunoglobulin M), Srm: 28 mg/dL (ref 15–143)
M Protein SerPl Elph-Mcnc: 0.2 g/dL — ABNORMAL HIGH
Total Protein ELP: 6.2 g/dL (ref 6.0–8.5)

## 2022-11-16 ENCOUNTER — Ambulatory Visit (INDEPENDENT_AMBULATORY_CARE_PROVIDER_SITE_OTHER): Payer: Medicare Other

## 2022-11-16 VITALS — BP 140/64 | HR 58 | Temp 97.7°F | Ht 74.0 in | Wt 194.0 lb

## 2022-11-16 DIAGNOSIS — Z0000 Encounter for general adult medical examination without abnormal findings: Secondary | ICD-10-CM | POA: Diagnosis not present

## 2022-11-16 NOTE — Progress Notes (Signed)
Subjective:   Nicholas Patton is a 88 y.o. male who presents for Medicare Annual/Subsequent preventive examination.  Visit Complete: Virtual  I connected with  STAR CORLESS on 11/16/22 by a audio enabled telemedicine application and verified that I am speaking with the correct person using two identifiers.  Patient Location: Home  Provider Location: Office/Clinic  I discussed the limitations of evaluation and management by telemedicine. The patient expressed understanding and agreed to proceed.  Patient Medicare AWV questionnaire was completed by the patient on N/A; I have confirmed that all information answered by patient is correct and no changes since this date.  Review of Systems    No ROS. Medicare Wellness Telephone Visit. Additional risk factors are reflected in social history. Cardiac Risk Factors include: male gender;dyslipidemia;diabetes mellitus;advanced age (>29men, >53 women);hypertension;sedentary lifestyle     Objective:    Today's Vitals   11/16/22 1107  BP: (!) 140/64  Pulse: (!) 58  Temp: 97.7 F (36.5 C)  SpO2: 94%  Weight: 194 lb (88 kg)  Height: 6\' 2"  (1.88 m)  PainSc: 5    Body mass index is 24.91 kg/m. Patient was unable to self-report due to a lack of equipment at home via telehealth, vitals taken from last ov.     11/16/2022   11:13 AM 08/03/2022   12:07 PM 05/27/2022    9:26 AM 11/06/2021   10:11 AM 10/22/2021   11:03 AM 09/08/2020    7:46 PM 05/18/2019    3:49 PM  Advanced Directives  Does Patient Have a Medical Advance Directive? Yes No Yes Yes Yes No Yes  Type of Estate agent of [employer] Power of [place];Living will Healthcare Power of [place];Living will Living will;Healthcare Power of [employer] Power of [place];Living will  Does patient want to make changes to medical advance directive? No - Patient declined   No - Patient declined No - Patient declined    Copy of Healthcare Power of Attorney  in Chart?    No - copy requested No - copy requested    Would patient like information on creating a medical advance directive?  No - Patient declined    No - Patient declined     Current Medications (verified) Outpatient Encounter Medications as of 11/16/2022  Medication Sig   albuterol (VENTOLIN HFA) 108 (90 Base) MCG/ACT inhaler INHALE 2 PUFFS BY MOUTH EVERY 6 HOURS AS NEEDED FOR WHEEZING FOR SHORTNESS OF BREATH   allopurinol (ZYLOPRIM) 100 MG tablet Take 1 tablet (100 mg total) by mouth daily.   atorvastatin (LIPITOR) 40 MG tablet Take 1 tablet (40 mg total) by mouth daily.   carvedilol (COREG) 12.5 MG tablet TAKE 1 TABLET BY MOUTH TWICE  DAILY WITH MEALS   doxazosin (CARDURA) 2 MG tablet Take 1 tablet (2 mg total) by mouth daily.   finasteride (PROSCAR) 5 MG tablet TAKE 1 TABLET BY MOUTH DAILY   fluticasone furoate-vilanterol (BREO ELLIPTA) 200-25 MCG/ACT AEPB Inhale 1 puff into the lungs daily.   furosemide (LASIX) 20 MG tablet Take 20 mg by mouth daily.   gabapentin (NEURONTIN) 100 MG capsule Take 100 mg by mouth 3 (three) times daily.   glimepiride (AMARYL) 2 MG tablet TAKE 2 TABLETS BY MOUTH DAILY  BEFORE BREAKFAST   levothyroxine (SYNTHROID) 50 MCG tablet Take 1 tablet (50 mcg total) by mouth daily.   losartan (COZAAR) 100 MG tablet TAKE 1 TABLET BY MOUTH DAILY   Multiple Vitamins-Minerals (MULTIVITAMIN PO) Take 1 tablet by  mouth daily.   pantoprazole (PROTONIX) 40 MG tablet Take 1 tablet (40 mg total) by mouth daily.   PAXIL 10 MG tablet Take 10 mg by mouth daily.   pioglitazone (ACTOS) 30 MG tablet Take 1 tablet (30 mg total) by mouth daily.   tamsulosin (FLOMAX) 0.4 MG CAPS capsule Take 1 capsule (0.4 mg total) by mouth daily.   tiZANidine (ZANAFLEX) 2 MG tablet Take 1-2 tablets (2-4 mg total) by mouth at bedtime as needed for muscle spasms.   No facility-administered encounter medications on file as of 11/16/2022.    Allergies (verified) Amlodipine, Cymbalta [duloxetine hcl],  and Oxycodone   History: Past Medical History:  Diagnosis Date   Anemia    Arthritis    Cataract    REMOVED BILATERAL   Colon polyps    COPD (chronic obstructive pulmonary disease) (HCC)    DIABETES MELLITUS, TYPE II    takes Metformin and Glimepiride daily   Diverticulosis    GERD (gastroesophageal reflux disease)    takes Protonix daily   History of colon polyps    HYPERLIPIDEMIA    takes Atorvastatin daily   HYPERTENSION    takes Amlodipine and Hyzaar daily   Ileus following gastrointestinal surgery (HCC) 07/22/2012   Joint pain    Peripheral neuropathy    Peripheral neuropathy    PERIPHERAL NEUROPATHY, FEET 10/06/2007   Renal disorder    VITAMIN B12 DEFICIENCY 09/06/2008   VITAMIN D DEFICIENCY 01/16/2010   takes Vitamin D daily   Past Surgical History:  Procedure Laterality Date   CATARACT EXTRACTION     both eyes   CHOLECYSTECTOMY N/A 07/18/2012   Procedure: LAPAROSCOPIC CHOLECYSTECTOMY WITH INTRAOPERATIVE CHOLANGIOGRAM;  Surgeon: Liz Malady, MD;  Location: [facility] OR;  Service: General;  Laterality: N/A;   COLONOSCOPY     POLYPECTOMY     POSTERIOR CERVICAL FUSION/FORAMINOTOMY N/A 06/27/2015   Procedure: Posterior Cervical Fusion with lateral mass fixation Cervical four to cervical seven;  Surgeon: Tia Alert, MD;  Location: [facility];  Service: Neurosurgery;  Laterality: N/A;   TOTAL KNEE ARTHROPLASTY  2001   Left   TURP VAPORIZATION     Family History  Problem Relation Age of Onset   Diabetes Mellitus II Mother    Lung cancer Brother    Colon cancer Neg Hx    Esophageal cancer Neg Hx    Rectal cancer Neg Hx    Stomach cancer Neg Hx    Social History   Socioeconomic History   Marital status: Married    Spouse name: Not on file   Number of children: Not on file   Years of education: Not on file   Highest education level: Not on file  Occupational History   Not on file  Tobacco Use   Smoking status: Former    Current packs/day: 0.00     Average packs/day: 1 pack/day for 31.0 years (31.0 ttl pk-yrs)    Types: Cigarettes    Start date: 12/22/1952    Quit date: 12/23/1983    Years since quitting: 38.9    Passive exposure: Never   Smokeless tobacco: Never  Vaping Use   Vaping status: Never Used  Substance and Sexual Activity   Alcohol use: No    Alcohol/week: 0.0 standard drinks of alcohol   Drug use: No   Sexual activity: Never  Other Topics Concern   Not on file  Social History Narrative   Not on file   Social Determinants of Health  Financial Resource Strain: Low Risk  (11/16/2022)   Overall Financial Resource Strain (CARDIA)    Difficulty of Paying Living Expenses: Not very hard  Food Insecurity: No Food Insecurity (11/16/2022)   Hunger Vital Sign    Worried About Running Out of Food in the Last Year: Never true    Ran Out of Food in the Last Year: Never true  Transportation Needs: No Transportation Needs (11/16/2022)   PRAPARE - Administrator, Civil Service (Medical): No    Lack of Transportation (Non-Medical): No  Physical Activity: Inactive (11/16/2022)   Exercise Vital Sign    Days of Exercise per Week: 0 days    Minutes of Exercise per Session: 0 min  Stress: No Stress Concern Present (11/16/2022)   [employer] of Occupational Health - Occupational Stress Questionnaire    Feeling of Stress : Not at all  Social Connections: Moderately Integrated (11/16/2022)   Social Connection and Isolation Panel [NHANES]    Frequency of Communication with Friends and Family: More than three times a week    Frequency of Social Gatherings with Friends and Family: Twice a week    Attends Religious Services: More than 4 times per year    Active Member of [employer] or Organizations: Yes    Attends Banker Meetings: More than 4 times per year    Marital Status: Widowed    Tobacco Counseling Counseling given: Not Answered   Clinical Intake:  Pre-visit preparation completed: Yes  Pain :  0-10 Pain Score: 5  Pain Type: Acute pain Pain Location: Shoulder Pain Orientation: Right Pain Descriptors / Indicators: Aching Pain Onset: In the past 7 days Pain Frequency: Occasional Effect of Pain on Daily Activities: laying down is bothersome     BMI - recorded: 24 Nutritional Status: BMI of 19-24  Normal Nutritional Risks: None Diabetes: Yes CBG done?: No Did pt. bring in CBG monitor from home?: No  How often do you need to have someone help you when you read instructions, pamphlets, or other written materials from your doctor or pharmacy?: 1 - Never What is the last grade level you completed in school?: 9th grade  Interpreter Needed?: No  Information entered by :: Elyse Jarvis, CMA   Activities of Daily Living    11/16/2022   11:14 AM  In your present state of health, do you have any difficulty performing the following activities:  Hearing? 1  Comment L ear hearing aid  Vision? 0  Difficulty concentrating or making decisions? 1  Comment sometimes per patient  Walking or climbing stairs? 0  Dressing or bathing? 0  Doing errands, shopping? 0  Preparing Food and eating ? N  Using the Toilet? N  In the past six months, have you accidently leaked urine? N  Do you have problems with loss of bowel control? N  Managing your Finances? N  Housekeeping or managing your Housekeeping? N    Patient Care Team: Corwin Levins, MD as PCP - Keturah Barre, MD as Referring Physician (Ophthalmology) Maxie Barb, MD as Consulting Physician (Nephrology)  Indicate any recent Medical Services you may have received from other than [facility] providers in the past year (date may be approximate).     Assessment:   This is a routine wellness examination for [place].  Hearing/Vision screen Patient wears hearing aid in Left ear.  Patient wears corrective lenses.   Goals Addressed  This Visit's Progress     Patient Stated (pt-stated)        I would  like to eventually get back to exercising.        Depression Screen    11/16/2022   11:13 AM 10/21/2022   10:07 AM 08/07/2022    2:06 PM 04/22/2022   10:48 AM 10/22/2021   11:02 AM 10/22/2021   10:26 AM 04/21/2021    3:07 PM  PHQ 2/9 Scores  PHQ - 2 Score 0 0 0 0 0  0  PHQ- 9 Score    0     Exception Documentation      Patient refusal     Fall Risk    11/16/2022   11:14 AM 10/21/2022   10:07 AM 08/07/2022    2:06 PM 04/22/2022   10:48 AM 10/22/2021   10:26 AM  Fall Risk   Falls in the past year? 1 1 1  0 0  Number falls in past yr: 1 1 0 0   Injury with Fall? 1 0 0 0 0  Risk for fall due to : History of fall(s) No Fall Risks No Fall Risks No Fall Risks No Fall Risks  Follow up Falls evaluation completed Falls evaluation completed Falls evaluation completed Falls evaluation completed Falls evaluation completed    MEDICARE RISK AT HOME: Medicare Risk at Home Any stairs in or around the home?: No If so, are there any without handrails?: No Home free of loose throw rugs in walkways, pet beds, electrical cords, etc?: Yes Adequate lighting in your home to reduce risk of falls?: Yes Life alert?: No Use of a cane, walker or w/c?: No Grab bars in the bathroom?: Yes Shower chair or bench in shower?: No Elevated toilet seat or a handicapped toilet?: Yes  TIMED UP AND GO:  Was the test performed?  No    Cognitive Function: Patient is cogitatively intact.    10/22/2021   11:05 AM  MMSE - Mini Mental State Exam  Orientation to time 5  Orientation to Place 5  Registration 3  Attention/ Calculation 5  Recall 3  Language- name 2 objects 2  Language- repeat 1  Language- follow 3 step command 3  Language- read & follow direction 1  Write a sentence 1  Copy design 1  Total score 30        11/16/2022   11:15 AM  6CIT Screen  What Year? 0 points  What month? 0 points  What time? 0 points  Count back from 20 0 points  Months in reverse 0 points  Repeat phrase 0 points  Total  Score 0 points    Immunizations Immunization History  Administered Date(s) Administered   Fluad Quad(high Dose 65+) 11/09/2018, 11/30/2019, 12/14/2020, 04/22/2022   Influenza Whole 11/21/2015   Influenza, High Dose Seasonal PF 01/24/2013, 12/01/2016, 11/22/2017   Influenza,inj,Quad PF,6+ Mos 12/08/2013   Influenza-Unspecified 02/08/2015   PFIZER(Purple Top)SARS-COV-2 Vaccination 05/03/2019, 05/04/2019, 12/23/2019, 10/10/2020   Pneumococcal Conjugate-13 03/22/2013   Pneumococcal Polysaccharide-23 01/10/2016   Td 10/05/2014   Zoster Recombinant(Shingrix) 07/25/2021, 01/19/2022    TDAP status: Up to date  Flu Vaccine status: Declined, Education has been provided regarding the importance of this vaccine but patient still declined. Advised may receive this vaccine at local pharmacy or Health Dept. Aware to provide a copy of the vaccination record if obtained from local pharmacy or Health Dept. Verbalized acceptance and understanding.  Pneumococcal vaccine status: Up to date  Covid-19 vaccine status: Completed vaccines  Qualifies for Shingles Vaccine? Yes   Zostavax completed No   Shingrix Completed?: Yes  Screening Tests Health Maintenance  Topic Date Due   INFLUENZA VACCINE  06/07/2023 (Originally 10/08/2022)   FOOT EXAM  04/23/2023   OPHTHALMOLOGY EXAM  10/27/2023   Medicare Annual Wellness (AWV)  11/16/2023   DTaP/Tdap/Td (2 - Tdap) 10/04/2024   Pneumonia Vaccine 23+ Years old  Completed   Zoster Vaccines- Shingrix  Completed   HPV VACCINES  Aged Out   COVID-19 Vaccine  Discontinued    Health Maintenance  There are no preventive care reminders to display for this patient.   Colorectal cancer screening: No longer required.   Lung Cancer Screening: (Low Dose CT Chest recommended if Age 76-80 years, 20 pack-year currently smoking OR have quit w/in 15years.) does not qualify.   Lung Cancer Screening Referral: N/A  Additional Screening:  Hepatitis C Screening: does not  qualify; Completed N/A  Vision Screening: Recommended annual ophthalmology exams for early detection of glaucoma and other disorders of the eye. Is the patient up to date with their annual eye exam?  Yes  Who is the provider or what is the name of the office in which the patient attends annual eye exams? Was seeing [facility] but now seeing Dr. Luciana Axe If pt is not established with a provider, would they like to be referred to a provider to establish care? No .   Dental Screening: Recommended annual dental exams for proper oral hygiene  Diabetic Foot Exam: Diabetic Foot Exam: Overdue, Pt has been advised about the importance in completing this exam. Pt is scheduled for diabetic foot exam on 01/13/2023.  Community Resource Referral / Chronic Care Management: CRR required this visit?  No   CCM required this visit?  No     Plan:     I have personally reviewed and noted the following in the patient's chart:   Medical and social history Use of alcohol, tobacco or illicit drugs  Current medications and supplements including opioid prescriptions. Patient is not currently taking opioid prescriptions. Functional ability and status Nutritional status Physical activity Advanced directives List of other physicians Hospitalizations, surgeries, and ER visits in previous 12 months Vitals Screenings to include cognitive, depression, and falls Referrals and appointments  In addition, I have reviewed and discussed with patient certain preventive protocols, quality metrics, and best practice recommendations. A written personalized care plan for preventive services as well as general preventive health recommendations were provided to patient.     Marinus Maw, CMA   11/16/2022   After Visit Summary: (MyChart) Due to this being a telephonic visit, the after visit summary with patients personalized plan was offered to patient via MyChart   Nurse Notes: N/A

## 2022-11-16 NOTE — Patient Instructions (Signed)
It was great speaking with you today!  Please schedule your next Medicare Wellness Visit with your Nurse Health Advisor in 1 year by calling [phone]. 

## 2022-11-17 ENCOUNTER — Inpatient Hospital Stay (HOSPITAL_BASED_OUTPATIENT_CLINIC_OR_DEPARTMENT_OTHER): Payer: Medicare Other | Admitting: Hematology and Oncology

## 2022-11-17 ENCOUNTER — Other Ambulatory Visit: Payer: Self-pay | Admitting: *Deleted

## 2022-11-17 DIAGNOSIS — D472 Monoclonal gammopathy: Secondary | ICD-10-CM

## 2022-11-17 DIAGNOSIS — R768 Other specified abnormal immunological findings in serum: Secondary | ICD-10-CM

## 2022-11-17 NOTE — Progress Notes (Signed)
HEMATOLOGY-ONCOLOGY TELEPHONE VISIT PROGRESS NOTE  I connected with our patient on 11/17/22 at 10:00 AM EDT by telephone and verified that I am speaking with the correct person using two identifiers.  I discussed the limitations, risks, security and privacy concerns of performing an evaluation and management service by telephone and the availability of in person appointments.  I also discussed with the patient that there may be a patient responsible charge related to this service. The patient expressed understanding and agreed to proceed.   History of Present Illness: Nicholas Patton is 88 Year-old- with Mgus. He presents to the clinic today via phone follow-up  to discuss lab results.  He reports no new problems or health issues.  The current report indicates that he has had serial benefit and increase in the free light chains.  This is also accompanied by worsening renal function.    REVIEW OF SYSTEMS:   Constitutional: Denies fevers, chills or abnormal weight loss All other systems were reviewed with the patient and are negative. Observations/Objective:     Assessment Plan:  MGUS (monoclonal gammopathy of unknown significance) Blood work review  09/20/2017: M protein 0.2 g (IgA kappa by immunofixation)Quantitative immunoglobulins: IgG 569, IgA 219, IgM 28 Bone survey 11/09/2017: Benign 10/25/2018: M protein 0.2 g (same as before) IgA kappa, Serum free kappa: 1409, KL ratio 136.8 (these labs were not done previously) 10/25/2019: M protein 0.2 g IgA kappa, kappa light chain: 1749, ratio 101, creatinine 1.76, beta-2 microglobulin: 3 10/23/2020: M protein: 0.3 g, Kappa 1697, lambda 13.1, ratio 129.54, hemoglobin 12.9, creatinine 2.06, beta-2 microglobulin 3.7 10/30/2021: M protein 0.2 g, Kappa 2082, ratio 152, creatinine 1.86, albumin 4.2, hemoglobin 13, beta-2 microglobulin 3.7  11/18/2021: Bone Survey:  Questionable new lucencies noted in the proximal right humeral diaphysis 11/17/2022: M protein: 0.2 g, kappa  3130, lambda 15.6, ratio 200.68, creatinine 2.27, hemoglobin 12.9, platelets 143, beta-2 microglobulin 4    MRI right humerus 11/26/2021:No suspicious or acute osseous findings within the proximal right humerus.  Probable full-thickness tear of the supraspinatus tendon   Plan: I am concerned about the persistent rise in the free kappa light chain.  I will recommend obtaining a bone marrow biopsy for further evaluation.  I discussed the case with Dr. Arlana Pouch who will follow the patient after the bone marrow biopsy and talk about treatment plan. I called patient's daughter and explained to her as well.      I discussed the assessment and treatment plan with the patient. The patient was provided an opportunity to ask questions and all were answered. The patient agreed with the plan and demonstrated an understanding of the instructions. The patient was advised to call back or seek an in-person evaluation if the symptoms worsen or if the condition fails to improve as anticipated.   I provided 12 minutes of non-face-to-face time during this encounter.  This includes time for charting and coordination of care   Tamsen Meek, MD  I Janan Ridge am acting as a scribe for Dr.Siaosi Alter  I have reviewed the above documentation for accuracy and completeness, and I agree with the above.

## 2022-11-17 NOTE — Assessment & Plan Note (Signed)
Blood work review  09/20/2017: M protein 0.2 g (IgA kappa by immunofixation)Quantitative immunoglobulins: IgG 569, IgA 219, IgM 28 Bone survey 11/09/2017: Benign 10/25/2018: M protein 0.2 g (same as before) IgA kappa, Serum free kappa: 1409, KL ratio 136.8 (these labs were not done previously) 10/25/2019: M protein 0.2 g IgA kappa, kappa light chain: 1749, ratio 101, creatinine 1.76, beta-2 microglobulin: 3 10/23/2020: M protein: 0.3 g, Kappa 1697, lambda 13.1, ratio 129.54, hemoglobin 12.9, creatinine 2.06, beta-2 microglobulin 3.7 10/30/2021: M protein 0.2 g, Kappa 2082, ratio 152, creatinine 1.86, albumin 4.2, hemoglobin 13, beta-2 microglobulin 3.7  11/18/2021: Bone Survey:  Questionable new lucencies noted in the proximal right humeral diaphysis 11/17/2022: M protein: 0.2 g, kappa 3130, lambda 15.6, ratio 200.68, creatinine 2.27, hemoglobin 12.9, platelets 143, beta-2 microglobulin 4    MRI right humerus 11/26/2021:No suspicious or acute osseous findings within the proximal right humerus.  Probable full-thickness tear of the supraspinatus tendon   Plan: I am concerned about the persistent rise in the free kappa light chain.  I will recommend obtaining a bone marrow biopsy for further evaluation.  Return to clinic 1 week after the bone marrow to discuss results

## 2022-12-01 ENCOUNTER — Ambulatory Visit: Payer: Medicare Other | Admitting: Internal Medicine

## 2022-12-01 DIAGNOSIS — E119 Type 2 diabetes mellitus without complications: Secondary | ICD-10-CM | POA: Diagnosis not present

## 2022-12-01 DIAGNOSIS — H43821 Vitreomacular adhesion, right eye: Secondary | ICD-10-CM | POA: Diagnosis not present

## 2022-12-01 DIAGNOSIS — H35371 Puckering of macula, right eye: Secondary | ICD-10-CM | POA: Diagnosis not present

## 2022-12-01 DIAGNOSIS — H40011 Open angle with borderline findings, low risk, right eye: Secondary | ICD-10-CM | POA: Diagnosis not present

## 2022-12-01 DIAGNOSIS — H4322 Crystalline deposits in vitreous body, left eye: Secondary | ICD-10-CM | POA: Diagnosis not present

## 2022-12-07 ENCOUNTER — Other Ambulatory Visit: Payer: Self-pay | Admitting: Radiology

## 2022-12-07 ENCOUNTER — Ambulatory Visit (INDEPENDENT_AMBULATORY_CARE_PROVIDER_SITE_OTHER): Payer: Medicare Other | Admitting: Family Medicine

## 2022-12-07 ENCOUNTER — Encounter: Payer: Self-pay | Admitting: Family Medicine

## 2022-12-07 VITALS — BP 140/72 | HR 80 | Temp 98.0°F | Resp 20 | Ht 74.0 in | Wt 191.0 lb

## 2022-12-07 DIAGNOSIS — D472 Monoclonal gammopathy: Secondary | ICD-10-CM

## 2022-12-07 DIAGNOSIS — J441 Chronic obstructive pulmonary disease with (acute) exacerbation: Secondary | ICD-10-CM

## 2022-12-07 MED ORDER — AZITHROMYCIN 250 MG PO TABS
ORAL_TABLET | ORAL | 0 refills | Status: AC
Start: 2022-12-07 — End: 2022-12-12

## 2022-12-07 MED ORDER — METHYLPREDNISOLONE 4 MG PO TBPK: ORAL_TABLET | ORAL | 0 refills | Status: DC

## 2022-12-07 NOTE — Progress Notes (Signed)
Assessment & Plan:  1. COPD exacerbation [facility]) Education provided on COPD exacerbations.  Continue symptom management.  Patient did call his daughter [place] while he was in the room, to whom I explained his diagnosis and treatment.  Patient is scheduled to have a biopsy completed tomorrow; I encouraged him to call and explain his situation and let them make the decision if he should still come or not. - methylPREDNISolone (MEDROL DOSEPAK) 4 MG TBPK tablet; Use as directed  Dispense: 21 tablet; Refill: 0 - azithromycin (ZITHROMAX) 250 MG tablet; Take 2 tablets (500 mg total) by mouth daily for 1 day, THEN 1 tablet (250 mg total) daily for 4 days.  Dispense: 6 each; Refill: 0  No results found for any visits on 12/07/22.  Follow up plan: Return if symptoms worsen or fail to improve.  Deliah Boston, MSN, APRN, FNP-C  Subjective:  HPI: Nicholas Patton is a 88 y.o. male presenting on 12/07/2022 for Cough (Cough, congestion - green, hoarse, no fever - started Tuesday/ Wed )  Patient complains of cough, chest congestion, shortness of breath, and hoarse . He denies fever. Onset of symptoms was 6 days ago, gradually worsening since that time. He is drinking plenty of fluids. Evaluation to date: at home COVID test negative. Treatment to date:  Mucinex and Albuterol inhaler . He has a history of COPD. He does not smoke.    ROS: Negative unless specifically indicated above in HPI.   Relevant past medical history reviewed and updated as indicated.   Allergies and medications reviewed and updated.   Current Outpatient Medications:    albuterol (VENTOLIN HFA) 108 (90 Base) MCG/ACT inhaler, INHALE 2 PUFFS BY MOUTH EVERY 6 HOURS AS NEEDED FOR WHEEZING FOR SHORTNESS OF BREATH, Disp: 18 g, Rfl: 2   allopurinol (ZYLOPRIM) 100 MG tablet, Take 1 tablet (100 mg total) by mouth daily., Disp: 90 tablet, Rfl: 1   aspirin EC 81 MG tablet, Take 81 mg by mouth daily. Swallow whole., Disp: , Rfl:    atorvastatin  (LIPITOR) 40 MG tablet, Take 1 tablet (40 mg total) by mouth daily., Disp: 90 tablet, Rfl: 3   carvedilol (COREG) 12.5 MG tablet, TAKE 1 TABLET BY MOUTH TWICE  DAILY WITH MEALS, Disp: 180 tablet, Rfl: 3   doxazosin (CARDURA) 2 MG tablet, Take 1 tablet (2 mg total) by mouth daily., Disp: 90 tablet, Rfl: 3   finasteride (PROSCAR) 5 MG tablet, TAKE 1 TABLET BY MOUTH DAILY, Disp: 90 tablet, Rfl: 3   fluticasone furoate-vilanterol (BREO ELLIPTA) 200-25 MCG/ACT AEPB, Inhale 1 puff into the lungs daily., Disp: 180 each, Rfl: 3   furosemide (LASIX) 20 MG tablet, Take 20 mg by mouth daily., Disp: , Rfl:    glimepiride (AMARYL) 2 MG tablet, TAKE 2 TABLETS BY MOUTH DAILY  BEFORE BREAKFAST, Disp: 180 tablet, Rfl: 3   levothyroxine (SYNTHROID) 50 MCG tablet, Take 1 tablet (50 mcg total) by mouth daily., Disp: 90 tablet, Rfl: 3   losartan (COZAAR) 100 MG tablet, TAKE 1 TABLET BY MOUTH DAILY, Disp: 90 tablet, Rfl: 3   pantoprazole (PROTONIX) 40 MG tablet, Take 1 tablet (40 mg total) by mouth daily., Disp: 90 tablet, Rfl: 3   pioglitazone (ACTOS) 30 MG tablet, Take 1 tablet (30 mg total) by mouth daily., Disp: 90 tablet, Rfl: 3   tamsulosin (FLOMAX) 0.4 MG CAPS capsule, Take 1 capsule (0.4 mg total) by mouth daily., Disp: 90 capsule, Rfl: 3   vitamin B-12 (CYANOCOBALAMIN) 100 MCG tablet, Take  100 mcg by mouth daily., Disp: , Rfl:   Allergies  Allergen Reactions   Amlodipine Swelling   Cymbalta [Duloxetine Hcl] Other (See Comments)   Oxycodone Other (See Comments)    Objective:   BP (!) 140/72   Pulse 80   Temp 98 F (36.7 C)   Resp 20   Ht 6\' 2"  (1.88 m)   Wt 191 lb (86.6 kg)   SpO2 95%   BMI 24.52 kg/m    Physical Exam Vitals reviewed.  Constitutional:      General: He is not in acute distress.    Appearance: Normal appearance. He is not ill-appearing, toxic-appearing or diaphoretic.  HENT:     Head: Normocephalic and atraumatic.     Right Ear: Tympanic membrane, ear canal and external ear  normal. There is no impacted cerumen.     Left Ear: Tympanic membrane, ear canal and external ear normal. There is no impacted cerumen.     Nose: Congestion present.     Right Sinus: No maxillary sinus tenderness or frontal sinus tenderness.     Left Sinus: No maxillary sinus tenderness or frontal sinus tenderness.     Mouth/Throat:     Mouth: Mucous membranes are moist.     Pharynx: Oropharynx is clear. No oropharyngeal exudate or posterior oropharyngeal erythema.     Tonsils: No tonsillar exudate or tonsillar abscesses.     Comments: Hoarse Eyes:     General: No scleral icterus.       Right eye: No discharge.        Left eye: No discharge.     Conjunctiva/sclera: Conjunctivae normal.  Cardiovascular:     Rate and Rhythm: Normal rate.  Pulmonary:     Effort: Pulmonary effort is normal. No respiratory distress.  Musculoskeletal:        General: Normal range of motion.     Cervical back: Normal range of motion.  Lymphadenopathy:     Cervical: No cervical adenopathy.  Skin:    General: Skin is warm and dry.  Neurological:     Mental Status: He is alert and oriented to person, place, and time. Mental status is at baseline.  Psychiatric:        Mood and Affect: Mood normal.        Behavior: Behavior normal.        Thought Content: Thought content normal.        Judgment: Judgment normal.

## 2022-12-08 ENCOUNTER — Ambulatory Visit (HOSPITAL_COMMUNITY): Payer: Medicare Other

## 2022-12-22 DIAGNOSIS — N184 Chronic kidney disease, stage 4 (severe): Secondary | ICD-10-CM | POA: Diagnosis not present

## 2022-12-29 DIAGNOSIS — M109 Gout, unspecified: Secondary | ICD-10-CM | POA: Diagnosis not present

## 2022-12-29 DIAGNOSIS — I129 Hypertensive chronic kidney disease with stage 1 through stage 4 chronic kidney disease, or unspecified chronic kidney disease: Secondary | ICD-10-CM | POA: Diagnosis not present

## 2022-12-29 DIAGNOSIS — D631 Anemia in chronic kidney disease: Secondary | ICD-10-CM | POA: Diagnosis not present

## 2022-12-29 DIAGNOSIS — N184 Chronic kidney disease, stage 4 (severe): Secondary | ICD-10-CM | POA: Diagnosis not present

## 2022-12-29 DIAGNOSIS — E1122 Type 2 diabetes mellitus with diabetic chronic kidney disease: Secondary | ICD-10-CM | POA: Diagnosis not present

## 2022-12-29 DIAGNOSIS — R600 Localized edema: Secondary | ICD-10-CM | POA: Diagnosis not present

## 2022-12-29 DIAGNOSIS — E538 Deficiency of other specified B group vitamins: Secondary | ICD-10-CM | POA: Diagnosis not present

## 2022-12-29 DIAGNOSIS — N2581 Secondary hyperparathyroidism of renal origin: Secondary | ICD-10-CM | POA: Diagnosis not present

## 2022-12-29 DIAGNOSIS — D472 Monoclonal gammopathy: Secondary | ICD-10-CM | POA: Diagnosis not present

## 2023-01-01 ENCOUNTER — Other Ambulatory Visit: Payer: Self-pay | Admitting: Radiology

## 2023-01-01 ENCOUNTER — Other Ambulatory Visit: Payer: Self-pay

## 2023-01-01 DIAGNOSIS — Z760 Encounter for issue of repeat prescription: Secondary | ICD-10-CM

## 2023-01-01 MED ORDER — ATORVASTATIN CALCIUM 40 MG PO TABS
40.0000 mg | ORAL_TABLET | Freq: Every day | ORAL | 3 refills | Status: DC
Start: 2023-01-01 — End: 2023-11-24

## 2023-01-01 MED ORDER — TAMSULOSIN HCL 0.4 MG PO CAPS
0.4000 mg | ORAL_CAPSULE | Freq: Every day | ORAL | 3 refills | Status: DC
Start: 2023-01-01 — End: 2023-06-01

## 2023-01-01 MED ORDER — PANTOPRAZOLE SODIUM 40 MG PO TBEC: 40.0000 mg | DELAYED_RELEASE_TABLET | Freq: Every day | ORAL | 3 refills | Status: DC

## 2023-01-01 NOTE — Consult Note (Signed)
Chief Complaint: Patient was seen in consultation today for bone marrow biopsy and aspiration at the request of Dr. Dorann Lodge    Supervising Physician: {Supervising Physician:[id number]}  Patient Status: {IR Consult Patient Status:21879}  History of Present Illness: Nicholas Patton is a 88 y.o. male ***  Past Medical History:  Diagnosis Date   Anemia    Arthritis    Cataract    REMOVED BILATERAL   Colon polyps    COPD (chronic obstructive pulmonary disease) (HCC)    DIABETES MELLITUS, TYPE II    takes Metformin and Glimepiride daily   Diverticulosis    GERD (gastroesophageal reflux disease)    takes Protonix daily   History of colon polyps    HYPERLIPIDEMIA    takes Atorvastatin daily   HYPERTENSION    takes Amlodipine and Hyzaar daily   Ileus following gastrointestinal surgery (HCC) 07/22/2012   Joint pain    Peripheral neuropathy    Peripheral neuropathy    PERIPHERAL NEUROPATHY, FEET 10/06/2007   Renal disorder    VITAMIN B12 DEFICIENCY 09/06/2008   VITAMIN D DEFICIENCY 01/16/2010   takes Vitamin D daily    Past Surgical History:  Procedure Laterality Date   CATARACT EXTRACTION     both eyes   CHOLECYSTECTOMY N/A 07/18/2012   Procedure: LAPAROSCOPIC CHOLECYSTECTOMY WITH INTRAOPERATIVE CHOLANGIOGRAM;  Surgeon: Liz Malady, MD;  Location: [facility] OR;  Service: General;  Laterality: N/A;   COLONOSCOPY     POLYPECTOMY     POSTERIOR CERVICAL FUSION/FORAMINOTOMY N/A 06/27/2015   Procedure: Posterior Cervical Fusion with lateral mass fixation Cervical four to cervical seven;  Surgeon: Tia Alert, MD;  Location: [facility] NEURO ORS;  Service: Neurosurgery;  Laterality: N/A;   TOTAL KNEE ARTHROPLASTY  2001   Left   TURP VAPORIZATION      Allergies: Amlodipine, Cymbalta [duloxetine hcl], and Oxycodone  Medications: Prior to Admission medications   Medication Sig Start Date End Date Taking? Authorizing Provider  albuterol (VENTOLIN HFA) 108 (90 Base) MCG/ACT inhaler  INHALE 2 PUFFS BY MOUTH EVERY 6 HOURS AS NEEDED FOR WHEEZING FOR SHORTNESS OF BREATH 08/26/22   Hunsucker, Lesia Sago, MD  allopurinol (ZYLOPRIM) 100 MG tablet Take 1 tablet (100 mg total) by mouth daily. 01/09/22   Rodolph Bong, MD  aspirin EC 81 MG tablet Take 81 mg by mouth daily. Swallow whole.    [provider]  atorvastatin (LIPITOR) 40 MG tablet Take 1 tablet (40 mg total) by mouth daily. 01/01/23   Corwin Levins, MD  carvedilol (COREG) 12.5 MG tablet TAKE 1 TABLET BY MOUTH TWICE  DAILY WITH MEALS 07/06/22   Corwin Levins, MD  doxazosin (CARDURA) 2 MG tablet Take 1 tablet (2 mg total) by mouth daily. 10/04/20   Corwin Levins, MD  finasteride (PROSCAR) 5 MG tablet TAKE 1 TABLET BY MOUTH DAILY 09/27/22   Bjorn Pippin, MD  fluticasone furoate-vilanterol (BREO ELLIPTA) 200-25 MCG/ACT AEPB Inhale 1 puff into the lungs daily. 03/25/22   Hunsucker, Lesia Sago, MD  furosemide (LASIX) 20 MG tablet Take 20 mg by mouth daily. 08/13/20   [provider]  glimepiride (AMARYL) 2 MG tablet TAKE 2 TABLETS BY MOUTH DAILY  BEFORE BREAKFAST 04/20/22   Corwin Levins, MD  levothyroxine (SYNTHROID) 50 MCG tablet Take 1 tablet (50 mcg total) by mouth daily. 10/21/22   Corwin Levins, MD  losartan (COZAAR) 100 MG tablet TAKE 1 TABLET BY MOUTH DAILY 04/20/22   Corwin Levins, MD  methylPREDNISolone (MEDROL DOSEPAK) 4 MG TBPK tablet Use as directed 12/07/22   Gwenlyn Fudge, FNP  pantoprazole (PROTONIX) 40 MG tablet Take 1 tablet (40 mg total) by mouth daily. 01/01/23   Corwin Levins, MD  pioglitazone (ACTOS) 30 MG tablet Take 1 tablet (30 mg total) by mouth daily. 10/21/22   Corwin Levins, MD  tamsulosin (FLOMAX) 0.4 MG CAPS capsule Take 1 capsule (0.4 mg total) by mouth daily. 01/01/23   Corwin Levins, MD  vitamin B-12 (CYANOCOBALAMIN) 100 MCG tablet Take 100 mcg by mouth daily.    [provider]     Family History  Problem Relation Age of Onset   Diabetes Mellitus II Mother    Lung cancer Brother     Colon cancer Neg Hx    Esophageal cancer Neg Hx    Rectal cancer Neg Hx    Stomach cancer Neg Hx     Social History   Socioeconomic History   Marital status: Married    Spouse name: Not on file   Number of children: Not on file   Years of education: Not on file   Highest education level: Not on file  Occupational History   Not on file  Tobacco Use   Smoking status: Former    Current packs/day: 0.00    Average packs/day: 1 pack/day for 31.0 years (31.0 ttl pk-yrs)    Types: Cigarettes    Start date: 12/22/1952    Quit date: 12/23/1983    Years since quitting: 39.0    Passive exposure: Never   Smokeless tobacco: Never  Vaping Use   Vaping status: Never Used  Substance and Sexual Activity   Alcohol use: No    Alcohol/week: 0.0 standard drinks of alcohol   Drug use: No   Sexual activity: Never  Other Topics Concern   Not on file  Social History Narrative   Not on file   Social Determinants of Health   Financial Resource Strain: Low Risk  (11/16/2022)   Overall Financial Resource Strain (CARDIA)    Difficulty of Paying Living Expenses: Not very hard  Food Insecurity: No Food Insecurity (11/16/2022)   Hunger Vital Sign    Worried About Running Out of Food in the Last Year: Never true    Ran Out of Food in the Last Year: Never true  Transportation Needs: No Transportation Needs (11/16/2022)   PRAPARE - Administrator, Civil Service (Medical): No    Lack of Transportation (Non-Medical): No  Physical Activity: Inactive (11/16/2022)   Exercise Vital Sign    Days of Exercise per Week: 0 days    Minutes of Exercise per Session: 0 min  Stress: No Stress Concern Present (11/16/2022)   [employer] of Occupational Health - Occupational Stress Questionnaire    Feeling of Stress : Not at all  Social Connections: Moderately Integrated (11/16/2022)   Social Connection and Isolation Panel [NHANES]    Frequency of Communication with Friends and Family: More than three  times a week    Frequency of Social Gatherings with Friends and Family: Twice a week    Attends Religious Services: More than 4 times per year    Active Member of [employer] or Organizations: Yes    Attends Banker Meetings: More than 4 times per year    Marital Status: Widowed       Review of Systems: A 12 point ROS discussed and pertinent positives are indicated in the HPI above.  All  other systems are negative.  Review of Systems  Vital Signs: There were no vitals taken for this visit.  Advance Care Plan: {Advance Care WUJW:11914}    Physical Exam  Imaging: No results found.  Labs:  CBC: Recent Labs    04/22/22 1142 08/03/22 1408 10/21/22 1109 11/10/22 0944  WBC 3.5* 4.6 3.1* 4.5  HGB 14.1 12.4* 13.1 12.9*  HCT 42.4 38.6* 41.4 40.6  PLT 143.0* 124* 162.0 143*    COAGS: No results for input(s): "INR", "APTT" in the last 8760 hours.  BMP: Recent Labs    04/22/22 1142 10/21/22 1109 11/10/22 0944  NA 140 142 142  K 4.1 3.8 3.9  CL 106 108 107  CO2 26 27 26   GLUCOSE 162* 174* 192*  BUN 49* 51* 54*  CALCIUM 10.6* 10.1 10.1  CREATININE 2.01* 2.32* 2.27*  GFRNONAA  --   --  27*    LIVER FUNCTION TESTS: Recent Labs    04/22/22 1142 10/21/22 1109 11/10/22 0944  BILITOT 0.6 0.6 0.7  AST 21 13 15   ALT 15 11 10   ALKPHOS 102 130* 114  PROT 7.2 7.0 7.1  ALBUMIN 4.3 4.2 4.2    TUMOR MARKERS: No results for input(s): "AFPTM", "CEA", "CA199", "CHROMGRNA" in the last 8760 hours.  Assessment and Plan      Risks and benefits of bone marrow biopsy and aspiration was discussed with the patient and/or patient's family including, but not limited to bleeding, infection, damage to adjacent structures or low yield requiring additional tests.  All of the questions were answered and there is agreement to proceed.  Consent signed and in chart.      Thank you for this interesting consult.  I greatly enjoyed meeting JARYAN MCKINZIE and look  forward to participating in their care.  A copy of this report was sent to the requesting provider on this date.  Electronically Signed: Ardith Dark, NP 01/01/2023, 4:28 PM   I spent a total of {New NWGN:[id number]} {New Out-Pt:[id number]}  {Established Out-Pt:[id number]} in face to face in clinical consultation, greater than 50% of which was counseling/coordinating care for bone marrow biopsy and aspiration

## 2023-01-04 ENCOUNTER — Encounter (HOSPITAL_COMMUNITY): Payer: Self-pay

## 2023-01-04 ENCOUNTER — Other Ambulatory Visit: Payer: Self-pay

## 2023-01-04 ENCOUNTER — Ambulatory Visit (HOSPITAL_COMMUNITY)
Admission: RE | Admit: 2023-01-04 | Discharge: 2023-01-04 | Disposition: A | Discharge status: Home / Self Care | Payer: Medicare Other | Source: Ambulatory Visit | Attending: Hematology and Oncology

## 2023-01-04 ENCOUNTER — Ambulatory Visit (HOSPITAL_COMMUNITY)
Admission: RE | Admit: 2023-01-04 | Discharge: 2023-01-04 | Disposition: A | Discharge status: Home / Self Care | Payer: Medicare Other | Source: Ambulatory Visit | Attending: Hematology and Oncology | Admitting: Hematology and Oncology

## 2023-01-04 DIAGNOSIS — D61818 Other pancytopenia: Secondary | ICD-10-CM | POA: Diagnosis not present

## 2023-01-04 DIAGNOSIS — C9000 Multiple myeloma not having achieved remission: Secondary | ICD-10-CM | POA: Insufficient documentation

## 2023-01-04 DIAGNOSIS — D472 Monoclonal gammopathy: Secondary | ICD-10-CM | POA: Diagnosis not present

## 2023-01-04 DIAGNOSIS — R768 Other specified abnormal immunological findings in serum: Secondary | ICD-10-CM | POA: Insufficient documentation

## 2023-01-04 LAB — CBC WITH DIFFERENTIAL/PLATELET
Abs Immature Granulocytes: 0.01 10*3/uL (ref 0.00–0.07)
Basophils Absolute: 0 10*3/uL (ref 0.0–0.1)
Basophils Relative: 1 %
Eosinophils Absolute: 0.2 10*3/uL (ref 0.0–0.5)
Eosinophils Relative: 5 %
HCT: 40 % (ref 39.0–52.0)
Hemoglobin: 12.4 g/dL — ABNORMAL LOW (ref 13.0–17.0)
Immature Granulocytes: 0 %
Lymphocytes Relative: 37 %
Lymphs Abs: 1.1 10*3/uL (ref 0.7–4.0)
MCH: 29.8 pg (ref 26.0–34.0)
MCHC: 31 g/dL (ref 30.0–36.0)
MCV: 96.2 fL (ref 80.0–100.0)
Monocytes Absolute: 0.4 10*3/uL (ref 0.1–1.0)
Monocytes Relative: 12 %
Neutro Abs: 1.4 10*3/uL — ABNORMAL LOW (ref 1.7–7.7)
Neutrophils Relative %: 45 %
Platelets: 132 10*3/uL — ABNORMAL LOW (ref 150–400)
RBC: 4.16 MIL/uL — ABNORMAL LOW (ref 4.22–5.81)
RDW: 16.5 % — ABNORMAL HIGH (ref 11.5–15.5)
WBC: 3.1 10*3/uL — ABNORMAL LOW (ref 4.0–10.5)
nRBC: 0 % (ref 0.0–0.2)

## 2023-01-04 LAB — GLUCOSE, CAPILLARY: Glucose-Capillary: 106 mg/dL — ABNORMAL HIGH (ref 70–99)

## 2023-01-04 MED ORDER — MIDAZOLAM HCL 2 MG/2ML IJ SOLN
INTRAMUSCULAR | Status: AC
  Filled 2023-01-04: qty 2

## 2023-01-04 MED ORDER — FENTANYL CITRATE (PF) 100 MCG/2ML IJ SOLN
INTRAMUSCULAR | Status: AC | PRN
Start: 2023-01-04 — End: 2023-01-04
  Administered 2023-01-04 (×2): 50 ug via INTRAVENOUS

## 2023-01-04 MED ORDER — SODIUM CHLORIDE 0.9 % IV SOLN: INTRAVENOUS | Status: DC

## 2023-01-04 MED ORDER — SODIUM CHLORIDE 0.9% FLUSH: 3.0000 mL | Freq: Two times a day (BID) | INTRAVENOUS | Status: DC

## 2023-01-04 MED ORDER — MIDAZOLAM HCL 2 MG/2ML IJ SOLN
INTRAMUSCULAR | Status: AC | PRN
Start: 2023-01-04 — End: 2023-01-04
  Administered 2023-01-04 (×2): 1 mg via INTRAVENOUS

## 2023-01-04 MED ORDER — FENTANYL CITRATE (PF) 100 MCG/2ML IJ SOLN
INTRAMUSCULAR | Status: AC
  Filled 2023-01-04: qty 2

## 2023-01-04 NOTE — Procedures (Signed)
Interventional Radiology Procedure Note  Procedure: CT guided bone marrow aspiration and biopsy  Complications: None  EBL: < 10 mL  Findings: Aspirate and core biopsy performed of bone marrow in right iliac bone.  Plan: Bedrest supine x 1 hrs  Lianni Kanaan T. San Lohmeyer, M.D Pager:  [phone]   

## 2023-01-04 NOTE — Discharge Instructions (Addendum)
Discharge Instructions:   Please call Interventional Radiology clinic [phone] with any questions or concerns.  You may remove your dressing and shower tomorrow.    Bone Marrow Aspiration and Bone Marrow Biopsy, Adult, Care After This sheet gives you information about how to care for yourself after your procedure. Your health care provider may also give you more specific instructions. If you have problems or questions, contact your health care provider. What can I expect after the procedure? After the procedure, it is common to have: Mild pain and tenderness. Swelling. Bruising. Follow these instructions at home: Puncture site care  Follow instructions from your health care provider about how to take care of the puncture site. Make sure you: Wash your hands with soap and water before and after you change your bandage (dressing). If soap and water are not available, use hand sanitizer. Change your dressing as told by your health care provider. Check your puncture site every day for signs of infection. Check for: More redness, swelling, or pain. Fluid or blood. Warmth. Pus or a bad smell. Activity Return to your normal activities as told by your health care provider. Ask your health care provider what activities are safe for you. Do not lift anything that is heavier than 10 lb (4.5 kg), or the limit that you are told, until your health care provider says that it is safe. Do not drive for 24 hours if you were given a sedative during your procedure. General instructions  Take over-the-counter and prescription medicines only as told by your health care provider. Do not take baths, swim, or use a hot tub until your health care provider approves. Ask your health care provider if you may take showers. You may only be allowed to take sponge baths. If directed, put ice on the affected area. To do this: Put ice in a plastic bag. Place a towel between your skin and the bag. Leave the ice  on for 20 minutes, 2-3 times a day. Keep all follow-up visits as told by your health care provider. This is important. Contact a health care provider if: Your pain is not controlled with medicine. You have a fever. You have more redness, swelling, or pain around the puncture site. You have fluid or blood coming from the puncture site. Your puncture site feels warm to the touch. You have pus or a bad smell coming from the puncture site. Summary After the procedure, it is common to have mild pain, tenderness, swelling, and bruising. Follow instructions from your health care provider about how to take care of the puncture site and what activities are safe for you. Take over-the-counter and prescription medicines only as told by your health care provider. Contact a health care provider if you have any signs of infection, such as fluid or blood coming from the puncture site. This information is not intended to replace advice given to you by your health care provider. Make sure you discuss any questions you have with your health care provider. Document Revised: 07/12/2018 Document Reviewed: 07/12/2018 Elsevier Patient Education  2023 Elsevier Inc.   Moderate Conscious Sedation, Adult, Care After This sheet gives you information about how to care for yourself after your procedure. Your health care provider may also give you more specific instructions. If you have problems or questions, contact your health care provider. What can I expect after the procedure? After the procedure, it is common to have: Sleepiness for several hours. Impaired judgment for several hours. Difficulty with balance. Vomiting if   you eat too soon. Follow these instructions at home: For the time period you were told by your health care provider: Rest. Do not participate in activities where you could fall or become injured. Do not drive or use machinery. Do not drink alcohol. Do not take sleeping pills or medicines that  cause drowsiness. Do not make important decisions or sign legal documents. Do not take care of children on your own. Eating and drinking  Follow the diet recommended by your health care provider. Drink enough fluid to keep your urine pale yellow. If you vomit: Drink water, juice, or soup when you can drink without vomiting. Make sure you have little or no nausea before eating solid foods. General instructions Take over-the-counter and prescription medicines only as told by your health care provider. Have a responsible adult stay with you for the time you are told. It is important to have someone help care for you until you are awake and alert. Do not smoke. Keep all follow-up visits as told by your health care provider. This is important. Contact a health care provider if: You are still sleepy or having trouble with balance after 24 hours. You feel light-headed. You keep feeling nauseous or you keep vomiting. You develop a rash. You have a fever. You have redness or swelling around the IV site. Get help right away if: You have trouble breathing. You have new-onset confusion at home. Summary After the procedure, it is common to feel sleepy, have impaired judgment, or feel nauseous if you eat too soon. Rest after you get home. Know the things you should not do after the procedure. Follow the diet recommended by your health care provider and drink enough fluid to keep your urine pale yellow. Get help right away if you have trouble breathing or new-onset confusion at home. This information is not intended to replace advice given to you by your health care provider. Make sure you discuss any questions you have with your health care provider. Document Revised: 06/23/2019 Document Reviewed: 01/19/2019 Elsevier Patient Education  2023 Elsevier Inc.  

## 2023-01-06 LAB — SURGICAL PATHOLOGY

## 2023-01-08 HISTORY — PX: EYE SURGERY: SHX253

## 2023-01-13 ENCOUNTER — Ambulatory Visit (INDEPENDENT_AMBULATORY_CARE_PROVIDER_SITE_OTHER): Payer: Medicare Other | Admitting: Podiatry

## 2023-01-13 DIAGNOSIS — E1142 Type 2 diabetes mellitus with diabetic polyneuropathy: Secondary | ICD-10-CM

## 2023-01-13 DIAGNOSIS — M79675 Pain in left toe(s): Secondary | ICD-10-CM

## 2023-01-13 DIAGNOSIS — L84 Corns and callosities: Secondary | ICD-10-CM

## 2023-01-13 DIAGNOSIS — B351 Tinea unguium: Secondary | ICD-10-CM | POA: Diagnosis not present

## 2023-01-13 DIAGNOSIS — M79674 Pain in right toe(s): Secondary | ICD-10-CM

## 2023-01-14 ENCOUNTER — Encounter (HOSPITAL_COMMUNITY): Payer: Self-pay

## 2023-01-15 ENCOUNTER — Encounter: Payer: Self-pay | Admitting: Podiatry

## 2023-01-15 NOTE — Progress Notes (Signed)
  Subjective:  Patient ID: Nicholas Patton, male    DOB: 1934-12-02,  MRN: [id number]  Nicholas Patton presents to clinic today for at risk foot care with history of diabetic neuropathy and corn(s) left foot and painful thick toenails that are difficult to trim. Painful toenails interfere with ambulation. Aggravating factors include wearing enclosed shoe gear. Pain is relieved with periodic professional debridement. Painful corns are aggravated when weightbearing when wearing enclosed shoe gear. Pain is relieved with periodic professional debridement.  Chief Complaint  Patient presents with   Diabetes    [facility] BS - 115 A1C - 7. LVPCP - 10/2022   New problem(s): None.   PCP is Corwin Levins, MD.  Allergies  Allergen Reactions   Amlodipine Swelling   Cymbalta [Duloxetine Hcl] Other (See Comments)   Oxycodone Other (See Comments)    Review of Systems: Negative except as noted in the HPI.  Objective: No changes noted in today's physical examination. There were no vitals filed for this visit. Nicholas Patton is a pleasant 88 y.o. male WD, WN in NAD. AAO x 3.  Neurovascular Examination: Capillary refill time to digits immediate b/l. Palpable DP pulse(s) b/l LE. Palpable PT pulse(s) b/l LE. Pedal hair absent. Nonpitting edema noted right lower extremity.  Pt has subjective symptoms of neuropathy. Protective sensation intact 5/5 intact bilaterally with 10g monofilament b/l. Vibratory sensation intact b/l.  Dermatological:  Pedal skin is warm and supple b/l LE. No open wounds b/l LE. No interdigital macerations noted b/l LE. Toenails 1-5 b/l elongated, discolored, dystrophic, thickened, crumbly with subungual debris and tenderness to dorsal palpation.   Hyperkeratotic lesion(s) medial PIPJ of L 2nd toe. Resolved 1st metatarsal head right foot and interdigital hallux left foot. No erythema, no edema, no drainage, no fluctuance.  Musculoskeletal:  Muscle strength 5/5 to all lower extremity  muscle groups bilaterally. No pain, crepitus or joint limitation noted with ROM bilateral LE. HAV with bunion deformity noted b/l LE. Hammertoe deformity noted 2-5 b/l.  Assessment/Plan: 1. Pain due to onychomycosis of toenails of both feet   2. Corns   3. Diabetic peripheral neuropathy associated with type 2 diabetes mellitus (HCC)     -Patient was evaluated today. All questions/concerns addressed on today's visit. -Patient to continue soft, supportive shoe gear daily. -Mycotic toenails 1-5 bilaterally were debrided in length and girth with sterile nail nippers and dremel without incident. -Corn(s) left second digit pared utilizing sterile scalpel blade without complication or incident. Total number debrided=1. -Dispensed tube foam. Apply to left great toe every morning. Remove every evening. -Patient/POA to call should there be question/concern in the interim.   Return in about 3 months (around 04/15/2023).  Freddie Breech, DPM

## 2023-01-20 DIAGNOSIS — H33311 Horseshoe tear of retina without detachment, right eye: Secondary | ICD-10-CM | POA: Diagnosis not present

## 2023-01-20 DIAGNOSIS — H3521 Other non-diabetic proliferative retinopathy, right eye: Secondary | ICD-10-CM | POA: Diagnosis not present

## 2023-01-20 DIAGNOSIS — Z961 Presence of intraocular lens: Secondary | ICD-10-CM | POA: Diagnosis not present

## 2023-01-20 DIAGNOSIS — H349 Unspecified retinal vascular occlusion: Secondary | ICD-10-CM | POA: Diagnosis not present

## 2023-01-20 DIAGNOSIS — H35371 Puckering of macula, right eye: Secondary | ICD-10-CM | POA: Diagnosis not present

## 2023-01-21 ENCOUNTER — Ambulatory Visit: Payer: Medicare Other | Admitting: Cardiology

## 2023-01-27 DIAGNOSIS — E119 Type 2 diabetes mellitus without complications: Secondary | ICD-10-CM | POA: Diagnosis not present

## 2023-01-27 DIAGNOSIS — H40011 Open angle with borderline findings, low risk, right eye: Secondary | ICD-10-CM | POA: Diagnosis not present

## 2023-01-27 DIAGNOSIS — H4322 Crystalline deposits in vitreous body, left eye: Secondary | ICD-10-CM | POA: Diagnosis not present

## 2023-01-27 DIAGNOSIS — H35371 Puckering of macula, right eye: Secondary | ICD-10-CM | POA: Diagnosis not present

## 2023-02-22 ENCOUNTER — Telehealth: Payer: Self-pay | Admitting: Internal Medicine

## 2023-02-22 ENCOUNTER — Ambulatory Visit: Payer: Medicare Other | Attending: Internal Medicine | Admitting: Internal Medicine

## 2023-02-22 ENCOUNTER — Encounter: Payer: Self-pay | Admitting: Internal Medicine

## 2023-02-22 VITALS — BP 140/60 | HR 80 | Resp 16 | Ht 74.0 in | Wt 192.0 lb

## 2023-02-22 DIAGNOSIS — E038 Other specified hypothyroidism: Secondary | ICD-10-CM

## 2023-02-22 DIAGNOSIS — R0609 Other forms of dyspnea: Secondary | ICD-10-CM

## 2023-02-22 DIAGNOSIS — I219 Acute myocardial infarction, unspecified: Secondary | ICD-10-CM | POA: Diagnosis not present

## 2023-02-22 DIAGNOSIS — E785 Hyperlipidemia, unspecified: Secondary | ICD-10-CM

## 2023-02-22 MED ORDER — EMPAGLIFLOZIN 25 MG PO TABS: 25.0000 mg | ORAL_TABLET | Freq: Every day | ORAL | 3 refills | Status: DC

## 2023-02-22 MED ORDER — FUROSEMIDE 40 MG PO TABS: 40.0000 mg | ORAL_TABLET | Freq: Every day | ORAL | 3 refills | Status: DC

## 2023-02-22 NOTE — Telephone Encounter (Signed)
-----   Message from Carolan Clines E sent at 02/22/2023  1:32 PM EST ----- Regarding: Pioglitazone Hi Dr. Jonny Ruiz,  We share this patient Mr. Zakowski. He was referred to me in cardiology for dyspnea. He has group 2/3 pulmonary hypertension. No signs of acute decompensation. I increased his maintenance lasix. Also, I wanted to see what you thought about changing his pioglitazone, this can contribute to VO; we tend to shy away from this drug in HFpEF patients. What do you think?  -MB

## 2023-02-22 NOTE — Telephone Encounter (Signed)
Ok to change the actos to [employer] 25 every day - done erx  Please to let pt know this, as we needed to change due to possible fluid retention from the actos

## 2023-02-22 NOTE — Progress Notes (Signed)
Cardiology Office Note:  .   Date:  02/22/2023  ID:  Nicholas NISBET, DOB Nov 19, 1934, MRN [id number] PCP: Corwin Levins, MD  [facility] Providers Cardiologist:  None    History of Present Illness: .   Nicholas Patton is a 88 y.o. male hx of asthma, MGUS, CKD stage 3,  DM2, HTN. Referral for dyspnea on exertion.  He has been followed by pulmonology with Dr. Judeth Horn who sent the referral.  His prior PFTs suggest asthma/emphysema.  There is some concern that his dyspnea can also be contributed to deconditioning.  He can only go about a block. Hgb 12.4. No wheezing. He's been on lasix for awhile per patient.  He believes he is taking 20 mg daily.  Discussed this with him as well as his daughter who was on the phone who lives in [place].   Echo in 2016 showed normal LV function, moderate pulmonary hypertension.  He had a Lexiscan in 2016 as well which showed no reversible ischemia.  He had a possible fixed defect in the apex which could be related to apical thinning.  He had an echo again in 2021 that showed normal LV function, LVH, moderate pulmonary hypertension.  BNP in 2021 was negative.   He denies angina, dyspnea on exertion, lower extremity edema, PND or orthopnea.    ROS:  per HPI otherwise negative   Studies Reviewed: Marland Kitchen        EKG Interpretation Date/Time:  Monday February 22 2023 09:55:01 EST Ventricular Rate:  76 PR Interval:  226 QRS Duration:  152 QT Interval:  420 QTC Calculation: 472 R Axis:   -86  Text Interpretation: Sinus rhythm with 1st degree A-V block Left axis deviation Right bundle Delise Simenson block When compared with ECG of 08-Sep-2020 19:48, Premature atrial complexes are no longer Present PR interval has increased Confirmed by Nicholas Patton ([id number]) on 02/22/2023 9:59:45 AM  Risk Assessment/Calculations:        Physical Exam:   VS:   Vitals:   02/22/23 0953  BP: (!) 140/60  Pulse: 80  Resp: 16  SpO2: 90%    Wt Readings from Last 3 Encounters:   01/04/23 192 lb (87.1 kg)  12/07/22 191 lb (86.6 kg)  11/16/22 194 lb (88 kg)    GEN: Well nourished, well developed in no acute distress NECK: No JVD; No carotid bruits CARDIAC: RRR, no murmurs, rubs, gallops RESPIRATORY:  Clear to auscultation without rales, wheezing or rhonchi  ABDOMEN: Soft, non-tender, non-distended EXTREMITIES:  No edema; No deformity   ASSESSMENT AND PLAN: .   DOE Pulmonary HTN From his prior echo as he has possible group 2 pulmonary hypertension which can contribute to his dyspnea exertion. As well as group 3 with asthma.  This is chronic for him and has had a prior Lexiscan stress which did not show ischemic disease.  His high-res CT did not show any significant calcified plaque.  Furthermore he has kidney disease which can contribute to challenges with elevated EDP with activity. Since he is inactive, I agree deconditioning is playing a part as well. Today he does not have signs of decompensated heart failure.  -With persistent symptoms can repeat a Lexiscan  -will increase Lasix 20 mg daily to 40 mg daily (may not be as effective at 20 with renal fxn) and see if this helps with his symptoms -BMET in 2 weeks -Will get a follow-up BNP  CKD Stage 3/IV Crt 2.27 , GFR 27  DM2 Recommend stopping  pioglitazone , will discuss with Dr. Jonny Patton; can contribute to VO  Conduction disease He has right bundle Tiane Szydlowski block and first-degree AV block.  This is stable.  We discussed that this is just something we will follow for now     Informed Consent   Shared Decision Making/Informed Consent The risks [chest pain, shortness of breath, cardiac arrhythmias, dizziness, blood pressure fluctuations, myocardial infarction, stroke/transient ischemic attack, nausea, vomiting, allergic reaction, radiation exposure, metallic taste sensation and life-threatening complications (estimated to be 1 in 10,000)], benefits (risk stratification, diagnosing coronary artery disease, treatment  guidance) and alternatives of a nuclear stress test were discussed in detail with Mr. Kosko and he agrees to proceed.     Dispo: Follow up with APP in 3 months, me in 6 months  Signed, Otisha Spickler, Alben Spittle, MD

## 2023-02-22 NOTE — Patient Instructions (Signed)
Medication Instructions:  Increase lasix to 40 mg  *If you need a refill on your cardiac medications before your next appointment, please call your pharmacy*   Lab Work: BNP today Bmet in 2 weeks  If you have labs (blood work) drawn today and your tests are completely normal, you will receive your results only by: MyChart Message (if you have MyChart) OR A paper copy in the mail If you have any lab test that is abnormal or we need to change your treatment, we will call you to review the results.   Testing/Procedures: Your physician has requested that you have a lexiscan myoview. For further information please visit [email or web address] Please follow instruction sheet, as given.    Follow-Up: At [facility], you and your health needs are our priority.  As part of our continuing mission to provide you with exceptional heart care, we have created designated Provider Care Teams.  These Care Teams include your primary Cardiologist (physician) and Advanced Practice Providers (APPs -  Physician Assistants and Nurse Practitioners) who all work together to provide you with the care you need, when you need it.    Your next appointment:   2 month(s)  Provider:   Marjie Skiff, PA-C    Then, Carolan Clines  will plan to see you again in 6 month(s).

## 2023-02-23 LAB — BRAIN NATRIURETIC PEPTIDE: BNP: 23.3 pg/mL (ref 0.0–100.0)

## 2023-03-01 ENCOUNTER — Telehealth (HOSPITAL_COMMUNITY): Payer: Self-pay

## 2023-03-01 NOTE — Telephone Encounter (Signed)
Detailed instructions left on the patient's answering machine. Asked to call back with any questions. S.Aby Gessel CCT

## 2023-03-08 DIAGNOSIS — R0609 Other forms of dyspnea: Secondary | ICD-10-CM | POA: Diagnosis not present

## 2023-03-08 DIAGNOSIS — E038 Other specified hypothyroidism: Secondary | ICD-10-CM | POA: Diagnosis not present

## 2023-03-08 DIAGNOSIS — E785 Hyperlipidemia, unspecified: Secondary | ICD-10-CM | POA: Diagnosis not present

## 2023-03-08 LAB — BASIC METABOLIC PANEL
BUN/Creatinine Ratio: 22 (ref 10–24)
BUN: 50 mg/dL — ABNORMAL HIGH (ref 8–27)
CO2: 21 mmol/L (ref 20–29)
Calcium: 10 mg/dL (ref 8.6–10.2)
Chloride: 106 mmol/L (ref 96–106)
Creatinine, Ser: 2.28 mg/dL — ABNORMAL HIGH (ref 0.76–1.27)
Glucose: 143 mg/dL — ABNORMAL HIGH (ref 70–99)
Potassium: 4.2 mmol/L (ref 3.5–5.2)
Sodium: 142 mmol/L (ref 134–144)
eGFR: 27 mL/min/{1.73_m2} — ABNORMAL LOW (ref >59)

## 2023-03-09 ENCOUNTER — Ambulatory Visit (HOSPITAL_COMMUNITY): Payer: Medicare Other | Attending: Internal Medicine

## 2023-03-09 DIAGNOSIS — R0609 Other forms of dyspnea: Secondary | ICD-10-CM | POA: Insufficient documentation

## 2023-03-09 LAB — MYOCARDIAL PERFUSION IMAGING
LV dias vol: 106 mL (ref 62–150)
LV sys vol: 37 mL
Nuc Stress EF: 65 %
Peak HR: 73 {beats}/min
Rest HR: 52 {beats}/min
Rest Nuclear Isotope Dose: 10.2 mCi
SDS: 0
SRS: 2
SSS: 2
ST Depression (mm): 0 mm
Stress Nuclear Isotope Dose: 31.2 mCi
TID: 0.89

## 2023-03-09 MED ORDER — REGADENOSON 0.4 MG/5ML IV SOLN
0.4000 mg | Freq: Once | INTRAVENOUS | Status: AC
  Administered 2023-03-09: 0.4 mg via INTRAVENOUS

## 2023-03-09 MED ORDER — TECHNETIUM TC 99M TETROFOSMIN IV KIT
31.2000 | PACK | Freq: Once | INTRAVENOUS | Status: AC | PRN
  Administered 2023-03-09: 31.2 via INTRAVENOUS

## 2023-03-09 MED ORDER — TECHNETIUM TC 99M TETROFOSMIN IV KIT
10.2000 | PACK | Freq: Once | INTRAVENOUS | Status: AC | PRN
  Administered 2023-03-09: 10.2 via INTRAVENOUS

## 2023-03-16 ENCOUNTER — Other Ambulatory Visit: Payer: Self-pay | Admitting: Pulmonary Disease

## 2023-04-01 NOTE — Progress Notes (Deleted)
 Cardiology Office Note:    Date:  04/01/2023   ID:  Nicholas Patton, DOB 04-17-1934, MRN [id number]  PCP:  Corwin Levins, MD  Cardiologist:  Maisie Fus, MD { Click to update primary MD,subspecialty MD or APP then REFRESH:1}    Referring MD: Corwin Levins, MD   Chief Complaint: follow-up of dyspnea on exertion  History of Present Illness:    Nicholas Patton is a 89 y.o. male with a history of chronic dyspnea on exertion, RBBB, hypertension, type 2 diabetes mellitus, CKD stage III/ IV, COPD/ asthma, and MGUS who is followed by Dr. Carolan Clines and presents today for follow-up of dyspnea on exertion.   Patient was recently referred to Dr. Carolan Clines in 02/2023 for further evaluation of dyspnea on exertion. Echo in 2016 showed normal LV function and moderate pulmonary hypertension. Prior PFTs were suggestive of asthma/ emphysema. Myoview was ordered for further evaluation which was low risk with no evidence of reversible ischemia.   He presents today for follow-up. ***  Dyspnea on exertion Patient has a long history of dyspnea on exertion. Echo in 2016 showed normal LV function and moderate pulmonary hypertension. Recent Myoview in 02/2023 was low risk with no evidence of ischemia. Prior PFTs suggestive of asthma/ emphysema.  - ***  Hypertension BP well controlled. *** - Continue current medications: Coreg 12.5mg  twice daily and Losartan 100mg  daily. Also on Cardura 3mg  daily for BPH. ***  Type 2 Diabetes Mellitus Hemoglobin 1c 8.2% in 10/2022. - On Jardiance and Glimepiride. - Management per PCP.  CKD Stage IIIb/ IV Baseline creatinine around 2.0 to 2.3. Stable at 2.28 in 02/2023.   EKGs/Labs/Other Studies Reviewed:    The following studies were reviewed:  Myoview 03/09/2023:   Findings are consistent with no ischemia. The study is low risk.   No ST deviation was noted.   LV perfusion is abnormal. Defect 1: There is a small defect with mild reduction in uptake present in  the apical apex location(s) that is fixed. There is normal wall motion in the defect area. Consistent with artifact caused by diaphragmatic attenuation.   Left ventricular function is normal. Nuclear stress EF: 65%. The left ventricular ejection fraction is normal (55-65%). End diastolic cavity size is mildly enlarged. End systolic cavity size is normal.   Prior study available for comparison from 09/06/2014. Mild fixed apical anterior perfusion defect, no ischemia. LVEF 54%  Small size, mild intensity fixed apical perfusion defect, suspicious for artifact. No reversible ischemia. LVEF 65% with normal wall motion. This is a low risk study. No significant change compared to prior study in 2016 except LVEF is higher.   EKG:  EKG not ordered today.   Recent Labs: 10/21/2022: TSH 6.89 11/10/2022: ALT 10 01/04/2023: Hemoglobin 12.4; Platelets 132 02/22/2023: BNP 23.3 03/08/2023: BUN 50; Creatinine, Ser 2.28; Potassium 4.2; Sodium 142  Recent Lipid Panel    Component Value Date/Time   CHOL 139 10/21/2022 1109   TRIG 69.0 10/21/2022 1109   HDL 50.80 10/21/2022 1109   CHOLHDL 3 10/21/2022 1109   VLDL 13.8 10/21/2022 1109   LDLCALC 74 10/21/2022 1109   LDLCALC 64 10/04/2019 1115    Physical Exam:    Vital Signs: There were no vitals taken for this visit.    Wt Readings from Last 3 Encounters:  03/09/23 192 lb (87.1 kg)  02/22/23 192 lb (87.1 kg)  01/04/23 192 lb (87.1 kg)     General: 89 y.o. male in no acute  distress. HEENT: Normocephalic and atraumatic. Sclera clear.  Neck: Supple. No carotid bruits. No JVD. Heart: *** RRR. Distinct S1 and S2. No murmurs, gallops, or rubs.  Lungs: No increased work of breathing. Clear to ausculation bilaterally. No wheezes, rhonchi, or rales.  Abdomen: Soft, non-distended, and non-tender to palpation.  Extremities: No lower extremity edema.  Radial and distal pedal pulses 2+ and equal bilaterally. Skin: Warm and dry. Neuro: No focal deficits. Psych:  Normal affect. Responds appropriately.   Assessment:    No diagnosis found.  Plan:     Disposition: Follow up in ***   Signed, [name]  04/01/2023 2:33 PM    [facility]

## 2023-04-05 ENCOUNTER — Other Ambulatory Visit: Payer: Self-pay

## 2023-04-05 ENCOUNTER — Other Ambulatory Visit: Payer: Self-pay | Admitting: Internal Medicine

## 2023-04-07 ENCOUNTER — Encounter (INDEPENDENT_AMBULATORY_CARE_PROVIDER_SITE_OTHER): Payer: Self-pay

## 2023-04-07 ENCOUNTER — Ambulatory Visit (INDEPENDENT_AMBULATORY_CARE_PROVIDER_SITE_OTHER): Payer: Medicare Other

## 2023-04-07 ENCOUNTER — Telehealth (INDEPENDENT_AMBULATORY_CARE_PROVIDER_SITE_OTHER): Payer: Self-pay | Admitting: Otolaryngology

## 2023-04-07 VITALS — BP 126/68 | HR 62 | Ht 74.0 in | Wt 194.0 lb

## 2023-04-07 DIAGNOSIS — R040 Epistaxis: Secondary | ICD-10-CM | POA: Diagnosis not present

## 2023-04-07 NOTE — Telephone Encounter (Signed)
Patient's daughter called in.   Patient has been having a nosebleed since 6 this morning.  Please call [phone]

## 2023-04-08 NOTE — Progress Notes (Signed)
Patient ID: Nicholas Patton, male   DOB: [birth year], 89 y.o.   MRN: [id number]  CC: Recurrent right epistaxis  Procedure:  Endoscopic control of recurrent right epistaxis  Indication:  The patient is an 89 year old male who returns today complaining of more recurrent right epistaxis.  He was previously treated with endoscopic cauterization of his right nasal septum.  According to the patient, he was doing well until last week, when he started experiencing more right-sided bleeding.  He has had 2 significant bleeding episodes this week.  The patient is not on any anticoagulation medication.  He denies any recent nasal trauma.  Description:  The right nasal cavity is sprayed with topical xylocaine and neo-synephrine.  After adequate anesthesia is achieved, the nasal cavity is examined with a 0 rigid endoscope.  A suction catheter is inserted in parallel with the 0 endoscope, and it is used to suction blood clots from the right nasal cavity.  Hypervascular areas are noted at the right nasal septum.  A silver nitrate stick is inserted in parallel with the 0 endoscope.  It is used to repeatedly cauterized the hypervascular areas.  Good hemostasis is achieved.  The patient tolerated the procedure well.  Assessment: 1.  Hypervascular areas are again noted on the right anterior and superior nasal septum.   2.  Active bleeding is noted today.    Plan: 1.  Endoscopic cauterization of the right anterior and superior nasal septum. 2.  The nasal endoscopy findings are reviewed with the patient. 3.  Nasal ointment/humidifier to treat his nasal dryness 4.  The patient will return for re-evaluation in 1 month.

## 2023-04-12 ENCOUNTER — Ambulatory Visit: Payer: Medicare Other | Admitting: Student

## 2023-04-14 ENCOUNTER — Ambulatory Visit (INDEPENDENT_AMBULATORY_CARE_PROVIDER_SITE_OTHER): Payer: Medicare Other | Admitting: Podiatry

## 2023-04-14 ENCOUNTER — Encounter: Payer: Self-pay | Admitting: Podiatry

## 2023-04-14 VITALS — Ht 74.0 in | Wt 194.0 lb

## 2023-04-14 DIAGNOSIS — E1142 Type 2 diabetes mellitus with diabetic polyneuropathy: Secondary | ICD-10-CM | POA: Diagnosis not present

## 2023-04-14 DIAGNOSIS — M79675 Pain in left toe(s): Secondary | ICD-10-CM | POA: Diagnosis not present

## 2023-04-14 DIAGNOSIS — M79674 Pain in right toe(s): Secondary | ICD-10-CM | POA: Diagnosis not present

## 2023-04-14 DIAGNOSIS — L84 Corns and callosities: Secondary | ICD-10-CM

## 2023-04-14 DIAGNOSIS — B351 Tinea unguium: Secondary | ICD-10-CM

## 2023-04-22 ENCOUNTER — Encounter: Payer: Self-pay | Admitting: Nurse Practitioner

## 2023-04-22 ENCOUNTER — Ambulatory Visit: Payer: Medicare Other | Attending: Nurse Practitioner | Admitting: Nurse Practitioner

## 2023-04-22 VITALS — BP 116/52 | HR 60 | Ht 74.0 in | Wt 193.0 lb

## 2023-04-22 DIAGNOSIS — I10 Essential (primary) hypertension: Secondary | ICD-10-CM | POA: Diagnosis not present

## 2023-04-22 DIAGNOSIS — I4510 Unspecified right bundle-branch block: Secondary | ICD-10-CM | POA: Insufficient documentation

## 2023-04-22 DIAGNOSIS — R0609 Other forms of dyspnea: Secondary | ICD-10-CM | POA: Diagnosis present

## 2023-04-22 DIAGNOSIS — N1832 Chronic kidney disease, stage 3b: Secondary | ICD-10-CM | POA: Insufficient documentation

## 2023-04-22 DIAGNOSIS — E118 Type 2 diabetes mellitus with unspecified complications: Secondary | ICD-10-CM | POA: Diagnosis not present

## 2023-04-22 NOTE — Progress Notes (Signed)
 Subjective:  Patient ID: Nicholas Patton, male    DOB: 1934-11-09,  MRN: [id number]  Nicholas Patton presents to clinic today for: at risk foot care with history of diabetic neuropathy and corn(s) left second digit and painful mycotic toenails that are difficult to trim. Painful toenails interfere with ambulation. Aggravating factors include wearing enclosed shoe gear. Pain is relieved with periodic professional debridement. Painful corns are aggravated when weightbearing when wearing enclosed shoe gear. Pain is relieved with periodic professional debridement.  Chief Complaint  Patient presents with   RFC    He is here for a nail trim, PCP is Dr. Norleen, seen 6 months ago.    PCP is Norleen Lynwood ORN, MD.  Allergies  Allergen Reactions   Amlodipine  Swelling   Cymbalta  [Duloxetine  Hcl] Other (See Comments)   Oxycodone  Other (See Comments)    Review of Systems: Negative except as noted in the HPI.  Objective: No changes noted in today's physical examination. There were no vitals filed for this visit.  Nicholas Patton is a pleasant 89 y.o. male WD, WN in NAD. AAO x 3.  Neurovascular Examination: Capillary refill time to digits immediate b/l. Palpable DP pulse(s) b/l LE. Palpable PT pulse(s) b/l LE. Pedal hair absent. Nonpitting edema noted right lower extremity.  Pt has subjective symptoms of neuropathy. Protective sensation intact 5/5 intact bilaterally with 10g monofilament b/l. Vibratory sensation intact b/l.  Dermatological:  Pedal skin is warm and supple b/l LE. No open wounds b/l LE. No interdigital macerations noted b/l LE. Toenails 1-5 b/l elongated, discolored, dystrophic, thickened, crumbly with subungual debris and tenderness to dorsal palpation.   Hyperkeratotic lesion(s) lateral aspect left great toe and medial PIPJ of L 2nd toe. Resolved 1st metatarsal head right foot left foot. No erythema, no edema, no drainage, no fluctuance.  Musculoskeletal:  Muscle strength 5/5 to all  lower extremity muscle groups bilaterally. No pain, crepitus or joint limitation noted with ROM bilateral LE. HAV with bunion deformity noted b/l LE. Hammertoe deformity noted 2-5 b/l.   Last HgA1c:      Latest Ref Rng & Units 10/21/2022   11:09 AM  Hemoglobin A1C  Hemoglobin-A1c 4.6 - 6.5 % 8.2    Assessment/Plan: 1. Pain due to onychomycosis of toenails of both feet   2. Corns   3. Diabetic peripheral neuropathy associated with type 2 diabetes mellitus (HCC)     Patient was evaluated and treated. All patient's and/or POA's questions/concerns addressed on today's visit. Toenails 1-5 debrided in length and girth without incident. Corn(s)  left great toe and left second digit  pared with sharp debridement without incident. Continue foot and shoe inspections daily. Monitor blood glucose per PCP/Endocrinologist's recommendations. Continue soft, supportive shoe gear daily. Report any pedal injuries to medical professional. Call office if there are any questions/concerns. -Dispensed tube foam. Apply to  affected digit(s)  every morning. Remove every evening. -Patient/POA to call should there be question/concern in the interim.   Return in about 3 months (around 07/12/2023).  Nicholas Patton, DPM      [place] LOCATION: [address], [place] [address]  Office [phone]   [facility] LOCATION: [address] [place], [place] [address] Office [phone]

## 2023-04-22 NOTE — Patient Instructions (Signed)
Medication Instructions:  Your physician recommends that you continue on your current medications as directed. Please refer to the Current Medication list given to you today.  *If you need a refill on your cardiac medications before your next appointment, please call your pharmacy*   Lab Work: NONE ordered at this time of appointment   Testing/Procedures: NONE ordered at this time of appointment   Follow-Up: At [facility], you and your health needs are our priority.  As part of our continuing mission to provide you with exceptional heart care, we have created designated Provider Care Teams.  These Care Teams include your primary Cardiologist (physician) and Advanced Practice Providers (APPs -  Physician Assistants and Nurse Practitioners) who all work together to provide you with the care you need, when you need it.  We recommend signing up for the patient portal called "MyChart".  Sign up information is provided on this After Visit Summary.  MyChart is used to connect with patients for Virtual Visits (Telemedicine).  Patients are able to view lab/test results, encounter notes, upcoming appointments, etc.  Non-urgent messages can be sent to your provider as well.   To learn more about what you can do with MyChart, go to [email or web address].    Your next appointment:    Keep follow up   Provider:   Maisie Fus, MD     Other Instructions

## 2023-04-22 NOTE — Progress Notes (Signed)
Office Visit    Patient Name: Nicholas Patton Date of Encounter: 04/22/2023  Primary Care Provider:  Corwin Levins, MD Primary Cardiologist:  Maisie Fus, MD  Chief Complaint    89 year old male with a history of chronic dyspnea on exertion, RBBB, hypertension, type 2 diabetes mellitus, CKD stage III/ IV, COPD/ asthma, and MGUS who presents follow-up related to dyspnea on exertion.   Past Medical History    Past Medical History:  Diagnosis Date   Anemia    Arthritis    Cataract    REMOVED BILATERAL   Colon polyps    COPD (chronic obstructive pulmonary disease) (HCC)    DIABETES MELLITUS, TYPE II    takes Metformin and Glimepiride daily   Diverticulosis    GERD (gastroesophageal reflux disease)    takes Protonix daily   History of colon polyps    HYPERLIPIDEMIA    takes Atorvastatin daily   HYPERTENSION    takes Amlodipine and Hyzaar daily   Ileus following gastrointestinal surgery (HCC) 07/22/2012   Joint pain    Peripheral neuropathy    Peripheral neuropathy    PERIPHERAL NEUROPATHY, FEET 10/06/2007   Renal disorder    VITAMIN B12 DEFICIENCY 09/06/2008   VITAMIN D DEFICIENCY 01/16/2010   takes Vitamin D daily   Past Surgical History:  Procedure Laterality Date   CATARACT EXTRACTION     both eyes   CHOLECYSTECTOMY N/A 07/18/2012   Procedure: LAPAROSCOPIC CHOLECYSTECTOMY WITH INTRAOPERATIVE CHOLANGIOGRAM;  Surgeon: Liz Malady, MD;  Location: [facility] OR;  Service: General;  Laterality: N/A;   COLONOSCOPY     EYE SURGERY Right 01/2023   POLYPECTOMY     POSTERIOR CERVICAL FUSION/FORAMINOTOMY N/A 06/27/2015   Procedure: Posterior Cervical Fusion with lateral mass fixation Cervical four to cervical seven;  Surgeon: Tia Alert, MD;  Location: [facility];  Service: Neurosurgery;  Laterality: N/A;   TOTAL KNEE ARTHROPLASTY  2001   Left   TURP VAPORIZATION      Allergies  Allergies  Allergen Reactions   Amlodipine Swelling   Cymbalta [Duloxetine Hcl]  Other (See Comments)   Oxycodone Other (See Comments)     Labs/Other Studies Reviewed    The following studies were reviewed today:  Cardiac Studies & Procedures   ______________________________________________________________________________________________   STRESS TESTS  MYOCARDIAL PERFUSION IMAGING 03/09/2023  Narrative   Findings are consistent with no ischemia. The study is low risk.   No ST deviation was noted.   LV perfusion is abnormal. Defect 1: There is a small defect with mild reduction in uptake present in the apical apex location(s) that is fixed. There is normal wall motion in the defect area. Consistent with artifact caused by diaphragmatic attenuation.   Left ventricular function is normal. Nuclear stress EF: 65%. The left ventricular ejection fraction is normal (55-65%). End diastolic cavity size is mildly enlarged. End systolic cavity size is normal.   Prior study available for comparison from 09/06/2014. Mild fixed apical anterior perfusion defect, no ischemia. LVEF 54%  Small size, mild intensity fixed apical perfusion defect, suspicious for artifact. No reversible ischemia. LVEF 65% with normal wall motion. This is a low risk study. No significant change compared to prior study in 2016 except LVEF is higher.   ECHOCARDIOGRAM  ECHOCARDIOGRAM COMPLETE 11/30/2019  Narrative ECHOCARDIOGRAM REPORT    Patient Name:   Nicholas Patton Date of Exam: 11/30/2019 Medical Rec #:  [id number]      Height:  74.5 in Accession #:    [id number]     Weight:       183.8 lb Date of Birth:  05-Apr-1934      BSA:          2.107 m Patient Age:    85 years       BP:           174/84 mmHg Patient Gender: M              HR:           51 bpm. Exam Location:  [address]  Procedure: 2D Echo, Cardiac Doppler and Color Doppler  MODIFIED REPORT: This report was modified by Weston Brass MD on 11/30/2019 due to revision. Indications:     R06.00 SOB  History:         Patient has  prior history of Echocardiogram examinations, most recent 09/06/2014. COPD and Pulmonary HTN; Risk Factors:Dyslipidemia, Hypertension and HLD.  Sonographer:     Clearence Ped RCS Referring Phys:  [id number] Karren Burly Diagnosing Phys: Weston Brass MD  IMPRESSIONS   1. Left ventricular ejection fraction, by estimation, is 60 to 65%. The left ventricle has normal function. The left ventricle has no regional wall motion abnormalities. There is severe asymmetric left ventricular hypertrophy of the basal-septal segment in the setting of moderate concentric hypertrophy. Left ventricular diastolic parameters are consistent with Grade I diastolic dysfunction (impaired relaxation). The average left ventricular global longitudinal strain is -19.6 %. The global longitudinal strain is normal. 2. Right ventricular systolic function is normal. The right ventricular size is mildly enlarged. There is moderately elevated pulmonary artery systolic pressure. The estimated right ventricular systolic pressure is 47.9 mmHg. 3. The mitral valve is normal in structure. Mild mitral valve regurgitation. No evidence of mitral stenosis. 4. Tricuspid valve regurgitation is mild to moderate. 5. The aortic valve is grossly normal. There is mild calcification of the aortic valve. Aortic valve regurgitation is not visualized. No aortic stenosis is present. 6. The inferior vena cava is normal in size with greater than 50% respiratory variability, suggesting right atrial pressure of 3 mmHg.  FINDINGS Left Ventricle: Left ventricular ejection fraction, by estimation, is 60 to 65%. The left ventricle has normal function. The left ventricle has no regional wall motion abnormalities. The average left ventricular global longitudinal strain is -19.6 %. The global longitudinal strain is normal. The left ventricular internal cavity size was normal in size. There is severe asymmetric left ventricular hypertrophy of the  basal-septal segment. Left ventricular diastolic parameters are consistent with Grade I diastolic dysfunction (impaired relaxation).  Right Ventricle: The right ventricular size is mildly enlarged. No increase in right ventricular wall thickness. Right ventricular systolic function is normal. There is moderately elevated pulmonary artery systolic pressure. The tricuspid regurgitant velocity is 3.35 m/s, and with an assumed right atrial pressure of 3 mmHg, the estimated right ventricular systolic pressure is 47.9 mmHg.  Left Atrium: Left atrial size was normal in size.  Right Atrium: Right atrial size was normal in size.  Pericardium: There is no evidence of pericardial effusion.  Mitral Valve: The mitral valve is normal in structure. Mild mitral valve regurgitation. No evidence of mitral valve stenosis.  Tricuspid Valve: The tricuspid valve is normal in structure. Tricuspid valve regurgitation is mild to moderate. No evidence of tricuspid stenosis.  Aortic Valve: The aortic valve is grossly normal. There is mild calcification of the aortic valve. Aortic valve regurgitation is not visualized. No aortic  stenosis is present.  Pulmonic Valve: The pulmonic valve was thickened with good excursion. Pulmonic valve regurgitation is trivial. No evidence of pulmonic stenosis.  Aorta: The aortic root is normal in size and structure.  Venous: The inferior vena cava is normal in size with greater than 50% respiratory variability, suggesting right atrial pressure of 3 mmHg.  IAS/Shunts: No atrial level shunt detected by color flow Doppler.   LEFT VENTRICLE PLAX 2D LVIDd:         3.75 cm  Diastology LVIDs:         2.40 cm  LV e' medial:    3.15 cm/s LV PW:         1.30 cm  LV E/e' medial:  18.6 LV IVS:        1.50 cm  LV e' lateral:   3.92 cm/s LVOT diam:     2.10 cm  LV E/e' lateral: 15.0 LV SV:         67 LV SV Index:   32       2D Longitudinal Strain LVOT Area:     3.46 cm 2D Strain GLS  (A2C):   -19.7 % 2D Strain GLS (A3C):   -22.7 % 2D Strain GLS (A4C):   -16.3 % 2D Strain GLS Avg:     -19.6 %  RIGHT VENTRICLE RV Basal diam:  3.80 cm RV S prime:     13.70 cm/s TAPSE (M-mode): 2.4 cm RVSP:           47.9 mmHg  LEFT ATRIUM             Index       RIGHT ATRIUM           Index LA diam:        3.50 cm 1.66 cm/m  RA Pressure: 3.00 mmHg LA Vol (A2C):   42.8 ml 20.32 ml/m RA Area:     15.20 cm LA Vol (A4C):   33.0 ml 15.67 ml/m RA Volume:   41.90 ml  19.89 ml/m LA Biplane Vol: 39.5 ml 18.75 ml/m AORTIC VALVE LVOT Vmax:   73.10 cm/s LVOT Vmean:  49.500 cm/s LVOT VTI:    0.193 m  AORTA Ao Root diam: 3.60 cm Ao Asc diam:  3.00 cm  MITRAL VALVE               TRICUSPID VALVE MV Area (PHT):             TR Peak grad:   44.9 mmHg MV Decel Time:             TR Vmax:        335.00 cm/s MV E velocity: 58.70 cm/s  Estimated RAP:  3.00 mmHg MV A velocity: 89.50 cm/s  RVSP:           47.9 mmHg MV E/A ratio:  0.66 SHUNTS Systemic VTI:  0.19 m Systemic Diam: 2.10 cm  Weston Brass MD Electronically signed by Weston Brass MD Signature Date/Time: 11/30/2019/2:06:52 PM    Final (Updated)          ______________________________________________________________________________________________     Recent Labs: 10/21/2022: TSH 6.89 11/10/2022: ALT 10 01/04/2023: Hemoglobin 12.4; Platelets 132 02/22/2023: BNP 23.3 03/08/2023: BUN 50; Creatinine, Ser 2.28; Potassium 4.2; Sodium 142  Recent Lipid Panel    Component Value Date/Time   CHOL 139 10/21/2022 1109   TRIG 69.0 10/21/2022 1109   HDL 50.80 10/21/2022 1109   CHOLHDL 3 10/21/2022 1109   VLDL 13.8 10/21/2022  1109   LDLCALC 74 10/21/2022 1109   LDLCALC 64 10/04/2019 1115    History of Present Illness   88 year old male with with the above past medical history including chronic dyspnea on exertion, RBBB, hypertension, type 2 diabetes mellitus, CKD stage III/ IV, COPD/ asthma, and MGUS.  Patient was  recently referred to Dr. Carolan Clines in 02/2023 for further evaluation of dyspnea on exertion. Echo in 2016 showed normal LV function and moderate pulmonary hypertension.  Most recent echocardiogram in 2021 showed EF 60 to 65%, normal LV function, no RWMA, severe asymmetric LVH of the basal septal segment, moderate concentric LVH, G1 DD, normal RV systolic function, moderately elevated PASP, mild mitral valve regurgitation, mild calcification of the aortic valve.  Prior PFTs were suggestive of asthma/ emphysema.  He was last seen in the office on 02/22/2023 and was stable from a cardiac standpoint.  Myoview was ordered for further evaluation which was low risk with no evidence of reversible ischemia. BNP was normal.   He presents today for follow-up accompanied by his daughter. Since his last visit he has been stable overall from a cardiac standpoint.  He continues to note dyspnea on exertion, particularly when walking longer distances, unchanged from prior visit. He denies any chest pain, palpitations, dizziness, edema, PND, orthopnea, weight gain. He notes that his primary care doctor started him on Jardiance, however, he was unable to afford this medication and therefore, has not been taking it.  Other than his ongoing shortness of breath, he denies any additional concerns today.  Home Medications    Current Outpatient Medications  Medication Sig Dispense Refill   albuterol (VENTOLIN HFA) 108 (90 Base) MCG/ACT inhaler INHALE 2 PUFFS BY MOUTH EVERY 6 HOURS AS NEEDED FOR WHEEZING FOR SHORTNESS OF BREATH 18 g 2   allopurinol (ZYLOPRIM) 100 MG tablet Take 1 tablet (100 mg total) by mouth daily. 90 tablet 1   atorvastatin (LIPITOR) 40 MG tablet Take 1 tablet (40 mg total) by mouth daily. 90 tablet 3   BREO ELLIPTA 200-25 MCG/ACT AEPB USE 1 INHALATION BY MOUTH ONCE  DAILY AT THE SAME TIME EACH DAY 180 each 3   carvedilol (COREG) 12.5 MG tablet TAKE 1 TABLET BY MOUTH TWICE  DAILY WITH MEALS 180 tablet 3    doxazosin (CARDURA) 2 MG tablet Take 1 tablet (2 mg total) by mouth daily. 90 tablet 3   empagliflozin (JARDIANCE) 25 MG TABS tablet Take 1 tablet (25 mg total) by mouth daily before breakfast. 90 tablet 3   finasteride (PROSCAR) 5 MG tablet TAKE 1 TABLET BY MOUTH DAILY 90 tablet 3   furosemide (LASIX) 40 MG tablet Take 1 tablet (40 mg total) by mouth daily. 90 tablet 3   glimepiride (AMARYL) 2 MG tablet TAKE 2 TABLETS BY MOUTH DAILY  BEFORE BREAKFAST 180 tablet 3   levothyroxine (SYNTHROID) 50 MCG tablet Take 1 tablet (50 mcg total) by mouth daily. 90 tablet 3   losartan (COZAAR) 100 MG tablet TAKE 1 TABLET BY MOUTH DAILY 90 tablet 3   pantoprazole (PROTONIX) 40 MG tablet Take 1 tablet (40 mg total) by mouth daily. 90 tablet 3   vitamin B-12 (CYANOCOBALAMIN) 100 MCG tablet Take 100 mcg by mouth daily.     tamsulosin (FLOMAX) 0.4 MG CAPS capsule Take 1 capsule (0.4 mg total) by mouth daily. (Patient not taking: Reported on 04/22/2023) 90 capsule 3   No current facility-administered medications for this visit.     Review of Systems  He denies chest pain, palpitations, pnd, orthopnea, n, v, dizziness, syncope, edema, weight gain, or early satiety. All other systems reviewed and are otherwise negative except as noted above.   Physical Exam    VS:  BP (!) 116/52 (BP Location: Right Arm, Patient Position: Sitting, Cuff Size: Normal)   Pulse 60   Ht 6\' 2"  (1.88 m)   Wt 193 lb (87.5 kg)   SpO2 96%   BMI 24.78 kg/m  GEN: Well nourished, well developed, in no acute distress. HEENT: normal. Neck: Supple, no JVD, carotid bruits, or masses. Cardiac: RRR, no murmurs, rubs, or gallops. No clubbing, cyanosis, edema.  Radials/DP/PT 2+ and equal bilaterally.  Respiratory:  Respirations regular and unlabored, clear to auscultation bilaterally. GI: Soft, nontender, nondistended, BS + x 4. MS: no deformity or atrophy. Skin: warm and dry, no rash. Neuro:  Strength and sensation are intact. Psych:  Normal affect.  Accessory Clinical Findings    ECG personally reviewed by me today -    - no EKG in office today.    Lab Results  Component Value Date   WBC 3.1 (L) 01/04/2023   HGB 12.4 (L) 01/04/2023   HCT 40.0 01/04/2023   MCV 96.2 01/04/2023   PLT 132 (L) 01/04/2023   Lab Results  Component Value Date   CREATININE 2.28 (H) 03/08/2023   BUN 50 (H) 03/08/2023   NA 142 03/08/2023   K 4.2 03/08/2023   CL 106 03/08/2023   CO2 21 03/08/2023   Lab Results  Component Value Date   ALT 10 11/10/2022   AST 15 11/10/2022   ALKPHOS 114 11/10/2022   BILITOT 0.7 11/10/2022   Lab Results  Component Value Date   CHOL 139 10/21/2022   HDL 50.80 10/21/2022   LDLCALC 74 10/21/2022   TRIG 69.0 10/21/2022   CHOLHDL 3 10/21/2022    Lab Results  Component Value Date   HGBA1C 8.2 (H) 10/21/2022    Assessment & Plan    1. Dyspnea on exertion/RBBB: Patient has a long history of dyspnea on exertion. Echo in 2016 showed normal LV function and moderate pulmonary hypertension. Most recent echocardiogram in 2021 showed EF 60 to 65%, normal LV function, no RWMA, severe asymmetric LVH of the basal septal segment, moderate concentric LVH, G1 DD, normal RV systolic function, moderately elevated PASP, mild mitral valve regurgitation, mild calcification of the aortic valve. Recent Myoview in 02/2023 was low risk with no evidence of ischemia, EF 65%. Prior PFTs suggestive of asthma/ emphysema. He follows with pulmonology. He continues to note dyspnea on exertion, particularly when walking any significant distance. He denies chest pain.  Euvolemic and well compensated on exam.  We discussed possible repeat echocardiogram, however, patient declines at this time.  Continue to monitor symptoms.  Continue carvedilol, losartan, Lasix.  2. Hypertension: BP well controlled. Continue current antihypertensive regimen.  3. Type 2 Diabetes Mellitus: Hemoglobin 1c 8.2% in 10/2022. Management per PCP.  4. CKD Stage  IIIb/IV: Baseline creatinine around 2.0 to 2.3. Stable at 2.28 in 02/2023.   5. Disposition: Follow-up as scheduled in 08/2023.     Joylene Grapes, NP 04/22/2023, 2:34 PM

## 2023-04-23 ENCOUNTER — Ambulatory Visit (INDEPENDENT_AMBULATORY_CARE_PROVIDER_SITE_OTHER): Payer: Medicare Other | Admitting: Internal Medicine

## 2023-04-23 ENCOUNTER — Encounter: Payer: Self-pay | Admitting: Internal Medicine

## 2023-04-23 VITALS — BP 118/60 | HR 60 | Temp 98.0°F | Resp 18 | Ht 74.0 in | Wt 193.0 lb

## 2023-04-23 DIAGNOSIS — E538 Deficiency of other specified B group vitamins: Secondary | ICD-10-CM

## 2023-04-23 DIAGNOSIS — Z7984 Long term (current) use of oral hypoglycemic drugs: Secondary | ICD-10-CM | POA: Diagnosis not present

## 2023-04-23 DIAGNOSIS — E1140 Type 2 diabetes mellitus with diabetic neuropathy, unspecified: Secondary | ICD-10-CM

## 2023-04-23 DIAGNOSIS — E559 Vitamin D deficiency, unspecified: Secondary | ICD-10-CM | POA: Diagnosis not present

## 2023-04-23 DIAGNOSIS — N1832 Chronic kidney disease, stage 3b: Secondary | ICD-10-CM

## 2023-04-23 DIAGNOSIS — E039 Hypothyroidism, unspecified: Secondary | ICD-10-CM | POA: Diagnosis not present

## 2023-04-23 DIAGNOSIS — M79604 Pain in right leg: Secondary | ICD-10-CM | POA: Diagnosis not present

## 2023-04-23 DIAGNOSIS — Z23 Encounter for immunization: Secondary | ICD-10-CM

## 2023-04-23 DIAGNOSIS — I10 Essential (primary) hypertension: Secondary | ICD-10-CM | POA: Diagnosis not present

## 2023-04-23 DIAGNOSIS — E7800 Pure hypercholesterolemia, unspecified: Secondary | ICD-10-CM | POA: Diagnosis not present

## 2023-04-23 LAB — LIPID PANEL
Cholesterol: 131 mg/dL (ref 0–200)
HDL: 49.7 mg/dL (ref >39.00)
LDL Cholesterol: 69 mg/dL (ref 0–99)
NonHDL: 81.71
Total CHOL/HDL Ratio: 3
Triglycerides: 65 mg/dL (ref 0.0–149.0)
VLDL: 13 mg/dL (ref 0.0–40.0)

## 2023-04-23 LAB — CBC WITH DIFFERENTIAL/PLATELET
Basophils Absolute: 0 10*3/uL (ref 0.0–0.1)
Basophils Relative: 0.7 % (ref 0.0–3.0)
Eosinophils Absolute: 0.1 10*3/uL (ref 0.0–0.7)
Eosinophils Relative: 2.2 % (ref 0.0–5.0)
HCT: 38.1 % — ABNORMAL LOW (ref 39.0–52.0)
Hemoglobin: 12.2 g/dL — ABNORMAL LOW (ref 13.0–17.0)
Lymphocytes Relative: 21.3 % (ref 12.0–46.0)
Lymphs Abs: 1 10*3/uL (ref 0.7–4.0)
MCHC: 32.1 g/dL (ref 30.0–36.0)
MCV: 95.5 fL (ref 78.0–100.0)
Monocytes Absolute: 0.4 10*3/uL (ref 0.1–1.0)
Monocytes Relative: 9.5 % (ref 3.0–12.0)
Neutro Abs: 3 10*3/uL (ref 1.4–7.7)
Neutrophils Relative %: 66.3 % (ref 43.0–77.0)
Platelets: 151 10*3/uL (ref 150.0–400.0)
RBC: 3.99 Mil/uL — ABNORMAL LOW (ref 4.22–5.81)
RDW: 16.6 % — ABNORMAL HIGH (ref 11.5–15.5)
WBC: 4.6 10*3/uL (ref 4.0–10.5)

## 2023-04-23 LAB — URINALYSIS, ROUTINE W REFLEX MICROSCOPIC
Bilirubin Urine: NEGATIVE
Hgb urine dipstick: NEGATIVE
Ketones, ur: NEGATIVE
Leukocytes,Ua: NEGATIVE
Nitrite: NEGATIVE
Specific Gravity, Urine: 1.015 (ref 1.000–1.030)
Urine Glucose: NEGATIVE
Urobilinogen, UA: 0.2 (ref 0.0–1.0)
pH: 6 (ref 5.0–8.0)

## 2023-04-23 LAB — BASIC METABOLIC PANEL
BUN: 54 mg/dL — ABNORMAL HIGH (ref 6–23)
CO2: 26 meq/L (ref 19–32)
Calcium: 9.9 mg/dL (ref 8.4–10.5)
Chloride: 107 meq/L (ref 96–112)
Creatinine, Ser: 2.57 mg/dL — ABNORMAL HIGH (ref 0.40–1.50)
GFR: 21.92 mL/min — ABNORMAL LOW (ref >60.00)
Glucose, Bld: 178 mg/dL — ABNORMAL HIGH (ref 70–99)
Potassium: 4.2 meq/L (ref 3.5–5.1)
Sodium: 143 meq/L (ref 135–145)

## 2023-04-23 LAB — HEPATIC FUNCTION PANEL
ALT: 11 U/L (ref 0–53)
AST: 14 U/L (ref 0–37)
Albumin: 4 g/dL (ref 3.5–5.2)
Alkaline Phosphatase: 113 U/L (ref 39–117)
Bilirubin, Direct: 0.1 mg/dL (ref 0.0–0.3)
Total Bilirubin: 0.6 mg/dL (ref 0.2–1.2)
Total Protein: 7 g/dL (ref 6.0–8.3)

## 2023-04-23 LAB — MICROALBUMIN / CREATININE URINE RATIO
Creatinine,U: 170.8 mg/dL
Microalb Creat Ratio: 10 mg/g (ref 0.0–30.0)
Microalb, Ur: 1.7 mg/dL (ref 0.0–1.9)

## 2023-04-23 LAB — VITAMIN B12: Vitamin B-12: 653 pg/mL (ref 211–911)

## 2023-04-23 LAB — VITAMIN D 25 HYDROXY (VIT D DEFICIENCY, FRACTURES): VITD: 27.75 ng/mL — ABNORMAL LOW (ref 30.00–100.00)

## 2023-04-23 LAB — HEMOGLOBIN A1C: Hgb A1c MFr Bld: 7.4 % — ABNORMAL HIGH (ref 4.6–6.5)

## 2023-04-23 LAB — TSH: TSH: 5.44 u[IU]/mL (ref 0.35–5.50)

## 2023-04-23 NOTE — Progress Notes (Signed)
The test results show that your current treatment is OK, as the tests are stable.  Please continue the same plan.  There is no other need for change of treatment or further evaluation based on these results, at this time.  thanks

## 2023-04-23 NOTE — Patient Instructions (Signed)
You had the flu shot today  Please call if you change your mind about checking the artery circulation in the legs  Please continue all other medications as before, and refills have been done if requested.  Please have the pharmacy call with any other refills you may need.  Please continue your efforts at being more active, low cholesterol diet, and weight control.  You are otherwise up to date with prevention measures today.  Please keep your appointments with your specialists as you may have planned  Please go to the LAB at the blood drawing area for the tests to be done  You will be contacted by phone if any changes need to be made immediately.  Otherwise, you will receive a letter about your results with an explanation, but please check with MyChart first.  Please make an Appointment to return in 6 months, or sooner if needed

## 2023-04-23 NOTE — Progress Notes (Signed)
 Patient ID: Nicholas Patton, male   DOB: 02-Jun-1934, 89 y.o.   MRN: [id number]        Chief Complaint: follow up HTN, HLD and DM, right leg coldness       HPI:  Nicholas Patton is a 89 y.o. male here overall doing ok,  Pt denies chest pain, increased sob or doe, wheezing, orthopnea, PND, increased LE swelling, palpitations, dizziness or syncope, but does have right leg coldness that might be worsening with activity.   Pt denies polydipsia, polyuria, or new focal neuro s/s.    Pt denies fever, wt loss, night sweats, loss of appetite, or other constitutional symptoms   Has seen ENT Dr Suszanne Conners recently with nosebleeds.  For flu shot today Wt Readings from Last 3 Encounters:  04/23/23 193 lb (87.5 kg)  04/22/23 193 lb (87.5 kg)  04/14/23 194 lb (88 kg)   BP Readings from Last 3 Encounters:  04/23/23 118/60  04/22/23 (!) 116/52  04/07/23 126/68         Past Medical History:  Diagnosis Date   Anemia    Arthritis    Cataract    REMOVED BILATERAL   Colon polyps    COPD (chronic obstructive pulmonary disease) (HCC)    DIABETES MELLITUS, TYPE II    takes Metformin and Glimepiride daily   Diverticulosis    GERD (gastroesophageal reflux disease)    takes Protonix daily   History of colon polyps    HYPERLIPIDEMIA    takes Atorvastatin daily   HYPERTENSION    takes Amlodipine and Hyzaar daily   Ileus following gastrointestinal surgery (HCC) 07/22/2012   Joint pain    Peripheral neuropathy    Peripheral neuropathy    PERIPHERAL NEUROPATHY, FEET 10/06/2007   Renal disorder    VITAMIN B12 DEFICIENCY 09/06/2008   VITAMIN D DEFICIENCY 01/16/2010   takes Vitamin D daily   Past Surgical History:  Procedure Laterality Date   CATARACT EXTRACTION     both eyes   CHOLECYSTECTOMY N/A 07/18/2012   Procedure: LAPAROSCOPIC CHOLECYSTECTOMY WITH INTRAOPERATIVE CHOLANGIOGRAM;  Surgeon: Liz Malady, MD;  Location: [facility] OR;  Service: General;  Laterality: N/A;   COLONOSCOPY     EYE SURGERY Right  01/2023   POLYPECTOMY     POSTERIOR CERVICAL FUSION/FORAMINOTOMY N/A 06/27/2015   Procedure: Posterior Cervical Fusion with lateral mass fixation Cervical four to cervical seven;  Surgeon: Tia Alert, MD;  Location: [facility];  Service: Neurosurgery;  Laterality: N/A;   TOTAL KNEE ARTHROPLASTY  2001   Left   TURP VAPORIZATION      reports that he quit smoking about 39 years ago. His smoking use included cigarettes. He started smoking about 70 years ago. He has a 31 pack-year smoking history. He has never been exposed to tobacco smoke. He has never used smokeless tobacco. He reports that he does not drink alcohol and does not use drugs. family history includes Diabetes Mellitus II in his mother; Lung cancer in his brother. Allergies  Allergen Reactions   Amlodipine Swelling   Cymbalta [Duloxetine Hcl] Other (See Comments)   Oxycodone Other (See Comments)   Current Outpatient Medications on File Prior to Visit  Medication Sig Dispense Refill   albuterol (VENTOLIN HFA) 108 (90 Base) MCG/ACT inhaler INHALE 2 PUFFS BY MOUTH EVERY 6 HOURS AS NEEDED FOR WHEEZING FOR SHORTNESS OF BREATH 18 g 2   allopurinol (ZYLOPRIM) 100 MG tablet Take 1 tablet (100 mg total) by mouth daily. 90 tablet 1  atorvastatin (LIPITOR) 40 MG tablet Take 1 tablet (40 mg total) by mouth daily. 90 tablet 3   BREO ELLIPTA 200-25 MCG/ACT AEPB USE 1 INHALATION BY MOUTH ONCE  DAILY AT THE SAME TIME EACH DAY 180 each 3   carvedilol (COREG) 12.5 MG tablet TAKE 1 TABLET BY MOUTH TWICE  DAILY WITH MEALS 180 tablet 3   doxazosin (CARDURA) 2 MG tablet Take 1 tablet (2 mg total) by mouth daily. 90 tablet 3   empagliflozin (JARDIANCE) 25 MG TABS tablet Take 1 tablet (25 mg total) by mouth daily before breakfast. 90 tablet 3   finasteride (PROSCAR) 5 MG tablet TAKE 1 TABLET BY MOUTH DAILY 90 tablet 3   furosemide (LASIX) 40 MG tablet Take 1 tablet (40 mg total) by mouth daily. 90 tablet 3   glimepiride (AMARYL) 2 MG tablet TAKE 2  TABLETS BY MOUTH DAILY  BEFORE BREAKFAST 180 tablet 3   levothyroxine (SYNTHROID) 50 MCG tablet Take 1 tablet (50 mcg total) by mouth daily. 90 tablet 3   losartan (COZAAR) 100 MG tablet TAKE 1 TABLET BY MOUTH DAILY 90 tablet 3   pantoprazole (PROTONIX) 40 MG tablet Take 1 tablet (40 mg total) by mouth daily. 90 tablet 3   tamsulosin (FLOMAX) 0.4 MG CAPS capsule Take 1 capsule (0.4 mg total) by mouth daily. 90 capsule 3   vitamin B-12 (CYANOCOBALAMIN) 100 MCG tablet Take 100 mcg by mouth daily.     No current facility-administered medications on file prior to visit.        ROS:  All others reviewed and negative.  Objective        PE:  BP 118/60 (BP Location: Right Arm, Patient Position: Sitting, Cuff Size: Normal)   Pulse 60   Temp 98 F (36.7 C) (Oral)   Resp 18   Ht 6\' 2"  (1.88 m)   Wt 193 lb (87.5 kg)   BMI 24.78 kg/m                 Constitutional: Pt appears in NAD               HENT: Head: NCAT.                Right Ear: External ear normal.                 Left Ear: External ear normal.                Eyes: . Pupils are equal, round, and reactive to light. Conjunctivae and EOM are normal               Nose: without d/c or deformity               Neck: Neck supple. Gross normal ROM               Cardiovascular: Normal rate and regular rhythm.                 Pulmonary/Chest: Effort normal and breath sounds without rales or wheezing.                Abd:  Soft, NT, ND, + BS, no organomegaly               Neurological: Pt is alert. At baseline orientation, motor grossly intact               Skin: Skin is warm. No rashes, no other new lesions, LE edema - none  Psychiatric: Pt behavior is normal without agitation   Micro: none  Cardiac tracings I have personally interpreted today:  none  Pertinent Radiological findings (summarize): none   Lab Results  Component Value Date   WBC 4.6 04/23/2023   HGB 12.2 (L) 04/23/2023   HCT 38.1 (L) 04/23/2023   PLT 151.0  04/23/2023   GLUCOSE 178 (H) 04/23/2023   CHOL 131 04/23/2023   TRIG 65.0 04/23/2023   HDL 49.70 04/23/2023   LDLCALC 69 04/23/2023   ALT 11 04/23/2023   AST 14 04/23/2023   NA 143 04/23/2023   K 4.2 04/23/2023   CL 107 04/23/2023   CREATININE 2.57 (H) 04/23/2023   BUN 54 (H) 04/23/2023   CO2 26 04/23/2023   TSH 5.44 04/23/2023   PSA 4.61 (H) 04/09/2017   INR 1.12 06/19/2015   HGBA1C 7.4 (H) 04/23/2023   MICROALBUR 1.7 04/23/2023   Assessment/Plan:  Nicholas Patton is a 89 y.o. Black or African American [2] male with  has a past medical history of Anemia, Arthritis, Cataract, Colon polyps, COPD (chronic obstructive pulmonary disease) (HCC), DIABETES MELLITUS, TYPE II, Diverticulosis, GERD (gastroesophageal reflux disease), History of colon polyps, HYPERLIPIDEMIA, HYPERTENSION, Ileus following gastrointestinal surgery (HCC) (07/22/2012), Joint pain, Peripheral neuropathy, Peripheral neuropathy, PERIPHERAL NEUROPATHY, FEET (10/06/2007), Renal disorder, VITAMIN B12 DEFICIENCY (09/06/2008), and VITAMIN D DEFICIENCY (01/16/2010).  Vitamin D deficiency Last vitamin D Lab Results  Component Value Date   VD25OH 27.75 (L) 04/23/2023   Low, to start oral replacement   Hypothyroid Lab Results  Component Value Date   TSH 5.44 04/23/2023   Stable, pt to continue levothyroxine 50 mcg qd  Hyperlipidemia Lab Results  Component Value Date   LDLCALC 69 04/23/2023   Stable, pt to continue current statin lipitor 40 mg qd   Essential hypertension BP Readings from Last 3 Encounters:  04/23/23 118/60  04/22/23 (!) 116/52  04/07/23 126/68   Stable, pt to continue medical treatment coreg 12.5 bid, losartan 100 qd   Diabetes Lab Results  Component Value Date   HGBA1C 7.4 (H) 04/23/2023   Stable for age, pt to continue current medical treatment jardiance 25 every day, glimeparide 2 mg every day,    CKD (chronic kidney disease) stage 3, GFR 30-59 ml/min Lab Results  Component  Value Date   CREATININE 2.57 (H) 04/23/2023   Stable overall, cont to avoid nephrotoxins   B12 deficiency Lab Results  Component Value Date   VITAMINB12 653 04/23/2023   Stable, cont oral replacement - b12 1000 mcg qd   Pain in lower limb ? Claudication - pt declines LE arterial dopplers for now  Followup: No follow-ups on file.  Oliver Barre, MD 04/25/2023 4:54 PM [facility]

## 2023-04-25 ENCOUNTER — Encounter: Payer: Self-pay | Admitting: Internal Medicine

## 2023-04-25 NOTE — Assessment & Plan Note (Signed)
?   Claudication - pt declines LE arterial dopplers for now

## 2023-04-25 NOTE — Assessment & Plan Note (Signed)
 Lab Results  Component Value Date   TSH 5.44 04/23/2023   Stable, pt to continue levothyroxine 50 mcg qd

## 2023-04-25 NOTE — Assessment & Plan Note (Signed)
 Lab Results  Component Value Date   LDLCALC 69 04/23/2023   Stable, pt to continue current statin lipitor 40 mg qd

## 2023-04-25 NOTE — Assessment & Plan Note (Signed)
 Lab Results  Component Value Date   HGBA1C 7.4 (H) 04/23/2023   Stable for age, pt to continue current medical treatment jardiance 25 every day, glimeparide 2 mg every day,

## 2023-04-25 NOTE — Assessment & Plan Note (Signed)
 Lab Results  Component Value Date   CREATININE 2.57 (H) 04/23/2023   Stable overall, cont to avoid nephrotoxins

## 2023-04-25 NOTE — Assessment & Plan Note (Signed)
 BP Readings from Last 3 Encounters:  04/23/23 118/60  04/22/23 (!) 116/52  04/07/23 126/68   Stable, pt to continue medical treatment coreg 12.5 bid, losartan 100 qd

## 2023-04-25 NOTE — Assessment & Plan Note (Signed)
 Lab Results  Component Value Date   VITAMINB12 653 04/23/2023   Stable, cont oral replacement - b12 1000 mcg qd

## 2023-04-25 NOTE — Assessment & Plan Note (Signed)
 Last vitamin D Lab Results  Component Value Date   VD25OH 27.75 (L) 04/23/2023   Low, to start oral replacement

## 2023-04-27 DIAGNOSIS — H35371 Puckering of macula, right eye: Secondary | ICD-10-CM | POA: Diagnosis not present

## 2023-04-27 DIAGNOSIS — E119 Type 2 diabetes mellitus without complications: Secondary | ICD-10-CM | POA: Diagnosis not present

## 2023-04-27 DIAGNOSIS — H4322 Crystalline deposits in vitreous body, left eye: Secondary | ICD-10-CM | POA: Diagnosis not present

## 2023-04-27 DIAGNOSIS — H40011 Open angle with borderline findings, low risk, right eye: Secondary | ICD-10-CM | POA: Diagnosis not present

## 2023-04-27 LAB — HM DIABETES EYE EXAM

## 2023-05-05 ENCOUNTER — Ambulatory Visit (INDEPENDENT_AMBULATORY_CARE_PROVIDER_SITE_OTHER): Payer: Medicare Other | Admitting: Pulmonary Disease

## 2023-05-05 ENCOUNTER — Encounter: Payer: Self-pay | Admitting: Pulmonary Disease

## 2023-05-05 ENCOUNTER — Ambulatory Visit: Payer: Medicare Other

## 2023-05-05 VITALS — BP 122/60 | HR 65 | Temp 97.6°F | Ht 74.0 in | Wt 189.0 lb

## 2023-05-05 DIAGNOSIS — R0609 Other forms of dyspnea: Secondary | ICD-10-CM | POA: Diagnosis not present

## 2023-05-05 DIAGNOSIS — J4541 Moderate persistent asthma with (acute) exacerbation: Secondary | ICD-10-CM | POA: Diagnosis not present

## 2023-05-05 DIAGNOSIS — J849 Interstitial pulmonary disease, unspecified: Secondary | ICD-10-CM | POA: Diagnosis not present

## 2023-05-05 MED ORDER — PREDNISONE 20 MG PO TABS: ORAL_TABLET | ORAL | 0 refills | Status: AC

## 2023-05-05 NOTE — Progress Notes (Signed)
 Patient ID: Nicholas Patton, male    DOB: 12-Jul-1934, 89 y.o.   MRN: [id number]  Chief Complaint  Patient presents with   Follow-up    C/o fatigue.  SOB with exertion has been persistent.    Referring provider: Corwin Levins, MD  HPI:   Nicholas Patton is a 89 y.o. man with reported "COPD" with spirometry without fixed obstruction but with significant bronchodilator response whom we are seeing in follow up for asthma, DOE.    Returns for scheduled follow-up.  Seen by PCP 2 weeks ago.  Denies worsening symptoms.  Today he states he is breathing is worse.  Daughter says same on the phone.  Seems gradual over time.  He has worse congestion over the last week or 2.  No fever or chills.  Clear on exam.  We discussed possible worsening related to asthma asthma exacerbation.  Treating with escalating inhalers.  We discussed at length we tried multiple inhalers maximal therapy without improvement.  There may not be more we can do.  Prednisone taper.  Will assess for blood clot given has not been assessed in a few years.  All imaging otherwise points to emphysema.  He has pulmonary hypertension likely but this cannot be treated given severe emphysema.  HPI at initial visit: Has had some mild dyspnea exertion going on some time now.  At least months to years.  He has been told he has COPD in the past but has never had spirometry.  Shortness of breath worse walking longer distances or on inclines.  Rest resolve symptoms.  No real shortness of breath at rest.  He has hayfever, seasonal allergies regularly.  Will use medicines to treat this when bad.  No recurrent pneumonias in the past no real bad bouts of bronchitis.  No asthma or exertional limitation as a child.  No syncope or presyncope.  Reportedly has occasionally received prednisone for some breathing difficulties in the past.  Is been at least 2 years or more since last use.  Spirometry recently demonstrates ratio at 68 to 69% but given his age  this is over 90% predicted and within normal limits.  Significant bronchodilator response in both FEV1 and FVC.  CT scan 05/2019 personally reviewed and interpreted as diffuse significant upper lobe predominant emphysema without other significant abnormality.  PMH: Diabetes, hypertension Surgical history: Cataracts, neck surgery/fusion next knee replacement, cholecystectomy Family history: No history of lung cancer Social history: Former smoker, quit late 80s, lives in [place] / Pulmonary Flowsheets:   ACT:      No data to display          MMRC: mMRC Dyspnea Scale mMRC Score  11/18/2020  4:28 PM 3  05/15/2020  1:35 PM 0    Epworth:      No data to display          Tests:   FENO:  No results found for: "NITRICOXIDE"  PFT:    Latest Ref Rng & Units 10/04/2019   10:53 AM  PFT Results  FVC-Pre L 3.21  P  FVC-Predicted Pre % 78  P  FVC-Post L 3.67  P  FVC-Predicted Post % 90  P  Pre FEV1/FVC % % 68  P  Post FEV1/FCV % % 69  P  FEV1-Pre L 2.18  P  FEV1-Predicted Pre % 73  P  FEV1-Post L 2.55  P  DLCO uncorrected ml/min/mmHg 12.86  P  DLCO UNC% % 49  P  DLCO  corrected ml/min/mmHg 12.94  P  DLCO COR %Predicted % 49  P  DLVA Predicted % 50  P  TLC L 7.34  P  TLC % Predicted % 95  P  RV % Predicted % 120  P    P Preliminary result  Personally reviewed and interpreted as normal spirometry with significant bronchodilator response.  TLC within normal limits, RV gas trapping.  DLCO severely reduced.  WALK:      No data to display          Imaging: No results found.  Lab Results:  CBC    Component Value Date/Time   WBC 4.6 04/23/2023 1047   RBC 3.99 (L) 04/23/2023 1047   HGB 12.2 (L) 04/23/2023 1047   HGB 12.9 (L) 11/10/2022 0944   HCT 38.1 (L) 04/23/2023 1047   PLT 151.0 04/23/2023 1047   PLT 143 (L) 11/10/2022 0944   MCV 95.5 04/23/2023 1047   MCH 29.8 01/04/2023 0935   MCHC 32.1 04/23/2023 1047   RDW 16.6 (H) 04/23/2023 1047    LYMPHSABS 1.0 04/23/2023 1047   MONOABS 0.4 04/23/2023 1047   EOSABS 0.1 04/23/2023 1047   BASOSABS 0.0 04/23/2023 1047    BMET    Component Value Date/Time   NA 143 04/23/2023 1047   NA 142 03/08/2023 0933   K 4.2 04/23/2023 1047   CL 107 04/23/2023 1047   CO2 26 04/23/2023 1047   GLUCOSE 178 (H) 04/23/2023 1047   BUN 54 (H) 04/23/2023 1047   BUN 50 (H) 03/08/2023 0933   CREATININE 2.57 (H) 04/23/2023 1047   CREATININE 2.27 (H) 11/10/2022 0944   CREATININE 1.68 (H) 10/04/2019 1115   CALCIUM 9.9 04/23/2023 1047   GFRNONAA 27 (L) 11/10/2022 0944   GFRNONAA 37 (L) 10/04/2019 1115   GFRAA 41 (L) 10/25/2019 1052   GFRAA 43 (L) 10/04/2019 1115    BNP    Component Value Date/Time   BNP 23.3 02/22/2023 1045   BNP 48.5 05/18/2019 1918    ProBNP No results found for: "PROBNP"  Specialty Problems       Pulmonary Problems   Allergic rhinitis   Qualifier: Diagnosis of  By: Jonny Ruiz MD, Len Blalock       Acute upper respiratory infection   Dyspnea   Left-sided nosebleed   Asthma   Acute cough   Acute asthmatic bronchitis   COPD (chronic obstructive pulmonary disease) (HCC)   Nosebleed    Allergies  Allergen Reactions   Amlodipine Swelling   Cymbalta [Duloxetine Hcl] Other (See Comments)   Oxycodone Other (See Comments)    Immunization History  Administered Date(s) Administered   Fluad Quad(high Dose 65+) 11/09/2018, 11/30/2019, 12/14/2020, 04/22/2022   Fluad Trivalent(High Dose 65+) 04/23/2023   Influenza Whole 11/21/2015   Influenza, High Dose Seasonal PF 01/24/2013, 12/01/2016, 11/22/2017   Influenza,inj,Quad PF,6+ Mos 12/08/2013   Influenza-Unspecified 02/08/2015   PFIZER(Purple Top)SARS-COV-2 Vaccination 05/03/2019, 05/04/2019, 12/23/2019, 10/10/2020   Pneumococcal Conjugate-13 03/22/2013   Pneumococcal Polysaccharide-23 01/10/2016   Td 10/05/2014   Zoster Recombinant(Shingrix) 07/25/2021, 01/19/2022    Past Medical History:  Diagnosis Date   Anemia     Arthritis    Cataract    REMOVED BILATERAL   Colon polyps    COPD (chronic obstructive pulmonary disease) (HCC)    DIABETES MELLITUS, TYPE II    takes Metformin and Glimepiride daily   Diverticulosis    GERD (gastroesophageal reflux disease)    takes Protonix daily   History of colon polyps  HYPERLIPIDEMIA    takes Atorvastatin daily   HYPERTENSION    takes Amlodipine and Hyzaar daily   Ileus following gastrointestinal surgery (HCC) 07/22/2012   Joint pain    Peripheral neuropathy    Peripheral neuropathy    PERIPHERAL NEUROPATHY, FEET 10/06/2007   Renal disorder    VITAMIN B12 DEFICIENCY 09/06/2008   VITAMIN D DEFICIENCY 01/16/2010   takes Vitamin D daily    Tobacco History: Social History   Tobacco Use  Smoking Status Former   Current packs/day: 0.00   Average packs/day: 1 pack/day for 31.0 years (31.0 ttl pk-yrs)   Types: Cigarettes   Start date: 12/22/1952   Quit date: 12/23/1983   Years since quitting: 39.3   Passive exposure: Never  Smokeless Tobacco Never   Counseling given: Not Answered    Continue to not smoke  Outpatient Encounter Medications as of 05/05/2023  Medication Sig   albuterol (VENTOLIN HFA) 108 (90 Base) MCG/ACT inhaler INHALE 2 PUFFS BY MOUTH EVERY 6 HOURS AS NEEDED FOR WHEEZING FOR SHORTNESS OF BREATH   allopurinol (ZYLOPRIM) 100 MG tablet Take 1 tablet (100 mg total) by mouth daily.   atorvastatin (LIPITOR) 40 MG tablet Take 1 tablet (40 mg total) by mouth daily.   BREO ELLIPTA 200-25 MCG/ACT AEPB USE 1 INHALATION BY MOUTH ONCE  DAILY AT THE SAME TIME EACH DAY   carvedilol (COREG) 12.5 MG tablet TAKE 1 TABLET BY MOUTH TWICE  DAILY WITH MEALS   doxazosin (CARDURA) 2 MG tablet Take 1 tablet (2 mg total) by mouth daily.   finasteride (PROSCAR) 5 MG tablet TAKE 1 TABLET BY MOUTH DAILY   furosemide (LASIX) 40 MG tablet Take 1 tablet (40 mg total) by mouth daily.   glimepiride (AMARYL) 2 MG tablet TAKE 2 TABLETS BY MOUTH DAILY  BEFORE  BREAKFAST   levothyroxine (SYNTHROID) 50 MCG tablet Take 1 tablet (50 mcg total) by mouth daily.   losartan (COZAAR) 100 MG tablet TAKE 1 TABLET BY MOUTH DAILY   pantoprazole (PROTONIX) 40 MG tablet Take 1 tablet (40 mg total) by mouth daily.   predniSONE (DELTASONE) 20 MG tablet Take 2 tablets (40 mg total) by mouth daily with breakfast for 5 days, THEN 1 tablet (20 mg total) daily with breakfast for 5 days.   tamsulosin (FLOMAX) 0.4 MG CAPS capsule Take 1 capsule (0.4 mg total) by mouth daily.   vitamin B-12 (CYANOCOBALAMIN) 100 MCG tablet Take 100 mcg by mouth daily.   [DISCONTINUED] empagliflozin (JARDIANCE) 25 MG TABS tablet Take 1 tablet (25 mg total) by mouth daily before breakfast. (Patient not taking: Reported on 05/05/2023)   No facility-administered encounter medications on file as of 05/05/2023.     Review of Systems  Review of Systems  N/a Physical Exam  BP 122/60 (BP Location: Left Arm, Patient Position: Sitting, Cuff Size: Normal)   Pulse 65   Temp 97.6 F (36.4 C) (Oral)   Ht 6\' 2"  (1.88 m)   Wt 189 lb (85.7 kg)   SpO2 94%   BMI 24.27 kg/m   Wt Readings from Last 5 Encounters:  05/05/23 189 lb (85.7 kg)  04/23/23 193 lb (87.5 kg)  04/22/23 193 lb (87.5 kg)  04/14/23 194 lb (88 kg)  04/07/23 194 lb (88 kg)    BMI Readings from Last 5 Encounters:  05/05/23 24.27 kg/m  04/23/23 24.78 kg/m  04/22/23 24.78 kg/m  04/14/23 24.91 kg/m  04/07/23 24.91 kg/m     Physical Exam General: Well-appearing, in no acute distress  Eyes: EOMI, no icterus Neck: Supple, no JVP appreciated sitting upright Respiratory: Clear to auscultation bilaterally, normal work of breathing on room air Extremities: Warm, no edema MSK: No synovitis, no joint effusions Neuro: Normal gait, no weakness Psych: Normal mood, full affect   Assessment & Plan:   DERRIS MILLAN is a 89 y.o. man with reported COPD with most recent spirometry without fixed obstruction but with significant  bronchodilator response whom we are seeing for follow up for evaluation of DOE.  DOE: PFTs are suggestive of asthma and this is likely a driver of symptoms.  Some of deconditioning given he admits to being less active.  Improved with ICS/LABA therapy but still present. Additionally, DLCO is severely reduced which is related to emphysema seen on CT but also concern for contribution of pulmonary HTN given elevated PA pressures seen on TTE in 2016. Repeat TTE 10/2019 with mildly elevated PASP, RV enlargement but normal RV function and size.  Also developing element of deconditioning given his lack of activity in the preceding months.  Escalated to Trelegy fall 2022 without improvement.  Subsequent improvement after resuming Virgel Bouquet Winter 2022.  With what seemed to be less improvement over the next several months.  Escalated to Breztri follow-up 2023 with worsening symptoms.  Return to Trelegy.  CT high-resolution 03/2022 with emphysema, no other concerning findings.  Suspect his shortness of breath related to asthma/emphysema is being maximally treated currently.  In addition, likely pulmonary hypertension could be contributing.  However given his significant emphysema, lack of ILD, he is a poor candidate for systemic and inhaled pulmonary vasodilators.  Given very high likelihood of worsening decompensation with treatment and unlikely improvement, no further workup, diagnostic test for pulmonary hypertension are recommended.  Escalate inhalers as above.  VQ scan to evaluate for possible PE given worsening in interim, notably he was seen by his PCP 2 weeks ago and denied worsening dyspnea on exertion.  Asthma with acute exacerbation: Had improved with ICS/LABA.  Encouraged increased exercise.  No real improvement in symptoms with Trelegy and Breztri.  Back on Breo.  Worsening dyspnea and congestion, cough over the last couple weeks.  Stop Breo, try Trelegy again.  If helpful can prescribe. prednisone taper.  Return  in about 6 months (around 11/02/2023) for f/u Dr. Judeth Horn.   Karren Burly, MD 05/05/2023

## 2023-05-05 NOTE — Patient Instructions (Signed)
 Stop Breo, use Trelegy.  Rinse mouth after every use.  In the Trelegy seems more beneficial let me know and I can prescribe.  Otherwise just resume the Breo.  We have tried Trelegy and Breztri and other inhalers in the past they have not been beneficial.  Prednisone taper today to help with the shortness of breath and congestion, phlegm.  VQ scan, perfusion scan to assess for possible blood clot, pulmonary embolism.  This scan will not harm the kidneys.    Return to clinic in 6 months or sooner as needed with Dr. Judeth Horn.

## 2023-05-05 NOTE — Addendum Note (Signed)
 Addended byVilma Meckel on: 05/05/2023 09:37 AM   Modules accepted: Orders

## 2023-05-06 DIAGNOSIS — D472 Monoclonal gammopathy: Secondary | ICD-10-CM | POA: Diagnosis not present

## 2023-05-06 DIAGNOSIS — D631 Anemia in chronic kidney disease: Secondary | ICD-10-CM | POA: Diagnosis not present

## 2023-05-06 DIAGNOSIS — E559 Vitamin D deficiency, unspecified: Secondary | ICD-10-CM | POA: Diagnosis not present

## 2023-05-06 DIAGNOSIS — E1122 Type 2 diabetes mellitus with diabetic chronic kidney disease: Secondary | ICD-10-CM | POA: Diagnosis not present

## 2023-05-06 DIAGNOSIS — N184 Chronic kidney disease, stage 4 (severe): Secondary | ICD-10-CM | POA: Diagnosis not present

## 2023-05-06 DIAGNOSIS — R600 Localized edema: Secondary | ICD-10-CM | POA: Diagnosis not present

## 2023-05-06 DIAGNOSIS — N2581 Secondary hyperparathyroidism of renal origin: Secondary | ICD-10-CM | POA: Diagnosis not present

## 2023-05-06 DIAGNOSIS — M109 Gout, unspecified: Secondary | ICD-10-CM | POA: Diagnosis not present

## 2023-05-06 DIAGNOSIS — N189 Chronic kidney disease, unspecified: Secondary | ICD-10-CM | POA: Diagnosis not present

## 2023-05-06 DIAGNOSIS — I129 Hypertensive chronic kidney disease with stage 1 through stage 4 chronic kidney disease, or unspecified chronic kidney disease: Secondary | ICD-10-CM | POA: Diagnosis not present

## 2023-05-07 ENCOUNTER — Telehealth (INDEPENDENT_AMBULATORY_CARE_PROVIDER_SITE_OTHER): Payer: Self-pay | Admitting: Otolaryngology

## 2023-05-07 NOTE — Telephone Encounter (Signed)
 Confirmed appt and address with patient for 05/10/2023.

## 2023-05-10 ENCOUNTER — Ambulatory Visit (INDEPENDENT_AMBULATORY_CARE_PROVIDER_SITE_OTHER): Payer: Medicare Other | Admitting: Otolaryngology

## 2023-05-10 ENCOUNTER — Encounter (INDEPENDENT_AMBULATORY_CARE_PROVIDER_SITE_OTHER): Payer: Self-pay

## 2023-05-10 VITALS — BP 128/55 | HR 69 | Ht 74.0 in | Wt 193.0 lb

## 2023-05-10 DIAGNOSIS — R040 Epistaxis: Secondary | ICD-10-CM | POA: Diagnosis not present

## 2023-05-10 NOTE — Progress Notes (Signed)
 Patient ID: Nicholas Patton, male   DOB: [birth year], 89 y.o.   MRN: [id number]  Follow-up: Recurrent right epistaxis   Procedure: Repeat endoscopic control of recurrent right epistaxis   Indication:  The patient is an 89 year old male who returns today for his follow-up evaluation.  He was previously seen for recurrent right epistaxis.  At his last visit 1 month ago, hypervascular areas were noted on the right anterior and superior nasal septum.  He was treated with endoscopic cauterization of the right nasal septum.  The patient returns today complaining of more right-sided epistaxis.  He had 2 episodes of severe right epistaxis last week.  He denies any recent nasal trauma. The patient is not on any anticoagulation medication.     Description:  The right nasal cavity is sprayed with topical xylocaine and neo-synephrine.  After adequate anesthesia is achieved, the nasal cavity is examined with a 0 rigid endoscope.  A suction catheter is inserted in parallel with the 0 endoscope, and it is used to suction blood clots from the right nasal cavity.  Hypervascular areas are again noted at the right nasal septum.  A silver nitrate stick is inserted in parallel with the 0 endoscope.  It is used to repeatedly cauterized the hypervascular areas.  Good hemostasis is achieved.  The patient tolerated the procedure well.   Assessment: 1.  Hypervascular areas are again noted on the right anterior and superior nasal septum.   2.  Slight bleeding is noted today.     Plan: 1.  Repeat endoscopic cauterization of the right anterior and superior nasal septum. 2.  The nasal endoscopy findings are reviewed with the patient. 3.  Nasal ointment/humidifier to treat his nasal dryness 4.  The patient will return for re-evaluation in 1 month.

## 2023-05-12 ENCOUNTER — Ambulatory Visit (HOSPITAL_COMMUNITY)
Admission: RE | Admit: 2023-05-12 | Discharge: 2023-05-12 | Disposition: A | Discharge status: Home / Self Care | Payer: Medicare Other | Source: Ambulatory Visit | Attending: Pulmonary Disease | Admitting: Pulmonary Disease

## 2023-05-12 ENCOUNTER — Encounter (HOSPITAL_COMMUNITY)
Admission: RE | Admit: 2023-05-12 | Discharge: 2023-05-12 | Disposition: A | Discharge status: Home / Self Care | Payer: Medicare Other | Source: Ambulatory Visit | Attending: Pulmonary Disease | Admitting: Pulmonary Disease

## 2023-05-12 DIAGNOSIS — J984 Other disorders of lung: Secondary | ICD-10-CM | POA: Diagnosis not present

## 2023-05-12 DIAGNOSIS — R0609 Other forms of dyspnea: Secondary | ICD-10-CM | POA: Diagnosis not present

## 2023-05-12 DIAGNOSIS — J849 Interstitial pulmonary disease, unspecified: Secondary | ICD-10-CM | POA: Diagnosis not present

## 2023-05-12 DIAGNOSIS — R0602 Shortness of breath: Secondary | ICD-10-CM | POA: Diagnosis not present

## 2023-05-12 DIAGNOSIS — J4541 Moderate persistent asthma with (acute) exacerbation: Secondary | ICD-10-CM | POA: Diagnosis not present

## 2023-05-12 DIAGNOSIS — J439 Emphysema, unspecified: Secondary | ICD-10-CM | POA: Diagnosis not present

## 2023-05-12 DIAGNOSIS — R059 Cough, unspecified: Secondary | ICD-10-CM | POA: Diagnosis not present

## 2023-05-12 MED ORDER — TECHNETIUM TO 99M ALBUMIN AGGREGATED
4.2000 | Freq: Once | INTRAVENOUS | Status: AC
  Administered 2023-05-12: 4.2 via INTRAVENOUS

## 2023-05-25 ENCOUNTER — Other Ambulatory Visit: Payer: Self-pay | Admitting: Internal Medicine

## 2023-05-25 ENCOUNTER — Other Ambulatory Visit: Payer: Self-pay

## 2023-05-26 ENCOUNTER — Encounter: Payer: Self-pay | Admitting: Pulmonary Disease

## 2023-05-30 ENCOUNTER — Emergency Department (HOSPITAL_BASED_OUTPATIENT_CLINIC_OR_DEPARTMENT_OTHER): Admitting: Radiology

## 2023-05-30 ENCOUNTER — Inpatient Hospital Stay (HOSPITAL_BASED_OUTPATIENT_CLINIC_OR_DEPARTMENT_OTHER)
Admission: EM | Admit: 2023-05-30 | Discharge: 2023-06-01 | DRG: 189 | Disposition: A | Discharge status: Home Health | Attending: Internal Medicine | Admitting: Internal Medicine

## 2023-05-30 ENCOUNTER — Encounter (HOSPITAL_BASED_OUTPATIENT_CLINIC_OR_DEPARTMENT_OTHER): Payer: Self-pay

## 2023-05-30 ENCOUNTER — Other Ambulatory Visit: Payer: Self-pay

## 2023-05-30 ENCOUNTER — Inpatient Hospital Stay (HOSPITAL_COMMUNITY)

## 2023-05-30 DIAGNOSIS — Z860100 Personal history of colon polyps, unspecified: Secondary | ICD-10-CM

## 2023-05-30 DIAGNOSIS — D472 Monoclonal gammopathy: Secondary | ICD-10-CM | POA: Diagnosis present

## 2023-05-30 DIAGNOSIS — J9601 Acute respiratory failure with hypoxia: Secondary | ICD-10-CM | POA: Diagnosis not present

## 2023-05-30 DIAGNOSIS — R778 Other specified abnormalities of plasma proteins: Secondary | ICD-10-CM | POA: Diagnosis not present

## 2023-05-30 DIAGNOSIS — J449 Chronic obstructive pulmonary disease, unspecified: Secondary | ICD-10-CM | POA: Diagnosis present

## 2023-05-30 DIAGNOSIS — Z87891 Personal history of nicotine dependence: Secondary | ICD-10-CM | POA: Diagnosis not present

## 2023-05-30 DIAGNOSIS — R0789 Other chest pain: Secondary | ICD-10-CM | POA: Diagnosis not present

## 2023-05-30 DIAGNOSIS — N1832 Chronic kidney disease, stage 3b: Secondary | ICD-10-CM | POA: Diagnosis present

## 2023-05-30 DIAGNOSIS — I10 Essential (primary) hypertension: Secondary | ICD-10-CM | POA: Diagnosis not present

## 2023-05-30 DIAGNOSIS — E1165 Type 2 diabetes mellitus with hyperglycemia: Secondary | ICD-10-CM | POA: Diagnosis present

## 2023-05-30 DIAGNOSIS — T502X5A Adverse effect of carbonic-anhydrase inhibitors, benzothiadiazides and other diuretics, initial encounter: Secondary | ICD-10-CM | POA: Diagnosis not present

## 2023-05-30 DIAGNOSIS — J439 Emphysema, unspecified: Secondary | ICD-10-CM | POA: Diagnosis not present

## 2023-05-30 DIAGNOSIS — Z833 Family history of diabetes mellitus: Secondary | ICD-10-CM

## 2023-05-30 DIAGNOSIS — Z79890 Hormone replacement therapy (postmenopausal): Secondary | ICD-10-CM | POA: Diagnosis not present

## 2023-05-30 DIAGNOSIS — R0602 Shortness of breath: Secondary | ICD-10-CM | POA: Diagnosis not present

## 2023-05-30 DIAGNOSIS — R918 Other nonspecific abnormal finding of lung field: Secondary | ICD-10-CM | POA: Diagnosis not present

## 2023-05-30 DIAGNOSIS — E1122 Type 2 diabetes mellitus with diabetic chronic kidney disease: Secondary | ICD-10-CM | POA: Diagnosis present

## 2023-05-30 DIAGNOSIS — I2489 Other forms of acute ischemic heart disease: Secondary | ICD-10-CM | POA: Diagnosis present

## 2023-05-30 DIAGNOSIS — I129 Hypertensive chronic kidney disease with stage 1 through stage 4 chronic kidney disease, or unspecified chronic kidney disease: Secondary | ICD-10-CM | POA: Diagnosis not present

## 2023-05-30 DIAGNOSIS — Z888 Allergy status to other drugs, medicaments and biological substances status: Secondary | ICD-10-CM | POA: Diagnosis not present

## 2023-05-30 DIAGNOSIS — Z66 Do not resuscitate: Secondary | ICD-10-CM | POA: Diagnosis present

## 2023-05-30 DIAGNOSIS — E119 Type 2 diabetes mellitus without complications: Secondary | ICD-10-CM

## 2023-05-30 DIAGNOSIS — R040 Epistaxis: Secondary | ICD-10-CM | POA: Diagnosis present

## 2023-05-30 DIAGNOSIS — E785 Hyperlipidemia, unspecified: Secondary | ICD-10-CM | POA: Diagnosis present

## 2023-05-30 DIAGNOSIS — Z885 Allergy status to narcotic agent status: Secondary | ICD-10-CM

## 2023-05-30 DIAGNOSIS — R059 Cough, unspecified: Secondary | ICD-10-CM | POA: Diagnosis not present

## 2023-05-30 DIAGNOSIS — R609 Edema, unspecified: Secondary | ICD-10-CM | POA: Diagnosis not present

## 2023-05-30 DIAGNOSIS — D631 Anemia in chronic kidney disease: Secondary | ICD-10-CM | POA: Diagnosis not present

## 2023-05-30 DIAGNOSIS — E1142 Type 2 diabetes mellitus with diabetic polyneuropathy: Secondary | ICD-10-CM | POA: Diagnosis present

## 2023-05-30 DIAGNOSIS — N184 Chronic kidney disease, stage 4 (severe): Secondary | ICD-10-CM | POA: Diagnosis present

## 2023-05-30 DIAGNOSIS — Z801 Family history of malignant neoplasm of trachea, bronchus and lung: Secondary | ICD-10-CM

## 2023-05-30 DIAGNOSIS — N185 Chronic kidney disease, stage 5: Secondary | ICD-10-CM | POA: Diagnosis not present

## 2023-05-30 DIAGNOSIS — I771 Stricture of artery: Secondary | ICD-10-CM | POA: Diagnosis not present

## 2023-05-30 DIAGNOSIS — I272 Pulmonary hypertension, unspecified: Secondary | ICD-10-CM | POA: Diagnosis present

## 2023-05-30 DIAGNOSIS — J9621 Acute and chronic respiratory failure with hypoxia: Principal | ICD-10-CM | POA: Diagnosis present

## 2023-05-30 DIAGNOSIS — M7989 Other specified soft tissue disorders: Secondary | ICD-10-CM | POA: Diagnosis present

## 2023-05-30 DIAGNOSIS — N400 Enlarged prostate without lower urinary tract symptoms: Secondary | ICD-10-CM | POA: Diagnosis not present

## 2023-05-30 DIAGNOSIS — Z96652 Presence of left artificial knee joint: Secondary | ICD-10-CM | POA: Diagnosis present

## 2023-05-30 DIAGNOSIS — Z7951 Long term (current) use of inhaled steroids: Secondary | ICD-10-CM

## 2023-05-30 DIAGNOSIS — J45909 Unspecified asthma, uncomplicated: Secondary | ICD-10-CM | POA: Diagnosis present

## 2023-05-30 DIAGNOSIS — K219 Gastro-esophageal reflux disease without esophagitis: Secondary | ICD-10-CM | POA: Diagnosis present

## 2023-05-30 DIAGNOSIS — E8770 Fluid overload, unspecified: Secondary | ICD-10-CM | POA: Diagnosis present

## 2023-05-30 DIAGNOSIS — R7989 Other specified abnormal findings of blood chemistry: Secondary | ICD-10-CM | POA: Diagnosis not present

## 2023-05-30 DIAGNOSIS — N179 Acute kidney failure, unspecified: Secondary | ICD-10-CM | POA: Diagnosis present

## 2023-05-30 DIAGNOSIS — I700 Atherosclerosis of aorta: Secondary | ICD-10-CM | POA: Diagnosis not present

## 2023-05-30 DIAGNOSIS — Z7984 Long term (current) use of oral hypoglycemic drugs: Secondary | ICD-10-CM

## 2023-05-30 DIAGNOSIS — E039 Hypothyroidism, unspecified: Secondary | ICD-10-CM | POA: Diagnosis present

## 2023-05-30 DIAGNOSIS — Z981 Arthrodesis status: Secondary | ICD-10-CM

## 2023-05-30 DIAGNOSIS — R053 Chronic cough: Secondary | ICD-10-CM | POA: Diagnosis not present

## 2023-05-30 DIAGNOSIS — J4489 Other specified chronic obstructive pulmonary disease: Secondary | ICD-10-CM | POA: Diagnosis present

## 2023-05-30 DIAGNOSIS — I509 Heart failure, unspecified: Secondary | ICD-10-CM | POA: Diagnosis not present

## 2023-05-30 DIAGNOSIS — Z1152 Encounter for screening for COVID-19: Secondary | ICD-10-CM

## 2023-05-30 DIAGNOSIS — Z79899 Other long term (current) drug therapy: Secondary | ICD-10-CM | POA: Diagnosis not present

## 2023-05-30 LAB — RESP PANEL BY RT-PCR (RSV, FLU A&B, COVID)  RVPGX2
Influenza A by PCR: NEGATIVE
Influenza B by PCR: NEGATIVE
Resp Syncytial Virus by PCR: NEGATIVE
SARS Coronavirus 2 by RT PCR: NEGATIVE

## 2023-05-30 LAB — CBC WITH DIFFERENTIAL/PLATELET
Abs Immature Granulocytes: 0.02 10*3/uL (ref 0.00–0.07)
Basophils Absolute: 0 10*3/uL (ref 0.0–0.1)
Basophils Relative: 1 %
Eosinophils Absolute: 0.1 10*3/uL (ref 0.0–0.5)
Eosinophils Relative: 2 %
HCT: 30.8 % — ABNORMAL LOW (ref 39.0–52.0)
Hemoglobin: 10.1 g/dL — ABNORMAL LOW (ref 13.0–17.0)
Immature Granulocytes: 0 %
Lymphocytes Relative: 16 %
Lymphs Abs: 0.8 10*3/uL (ref 0.7–4.0)
MCH: 30.1 pg (ref 26.0–34.0)
MCHC: 32.8 g/dL (ref 30.0–36.0)
MCV: 91.9 fL (ref 80.0–100.0)
Monocytes Absolute: 0.4 10*3/uL (ref 0.1–1.0)
Monocytes Relative: 8 %
Neutro Abs: 3.6 10*3/uL (ref 1.7–7.7)
Neutrophils Relative %: 73 %
Platelets: 214 10*3/uL (ref 150–400)
RBC: 3.35 MIL/uL — ABNORMAL LOW (ref 4.22–5.81)
RDW: 15 % (ref 11.5–15.5)
WBC: 4.9 10*3/uL (ref 4.0–10.5)
nRBC: 0 % (ref 0.0–0.2)

## 2023-05-30 LAB — COMPREHENSIVE METABOLIC PANEL
ALT: 19 U/L (ref 0–44)
AST: 19 U/L (ref 15–41)
Albumin: 3.7 g/dL (ref 3.5–5.0)
Alkaline Phosphatase: 75 U/L (ref 38–126)
Anion gap: 12 (ref 5–15)
BUN: 46 mg/dL — ABNORMAL HIGH (ref 8–23)
CO2: 22 mmol/L (ref 22–32)
Calcium: 9.4 mg/dL (ref 8.9–10.3)
Chloride: 107 mmol/L (ref 98–111)
Creatinine, Ser: 2.61 mg/dL — ABNORMAL HIGH (ref 0.61–1.24)
GFR, Estimated: 23 mL/min — ABNORMAL LOW (ref >60)
Glucose, Bld: 164 mg/dL — ABNORMAL HIGH (ref 70–99)
Potassium: 3.7 mmol/L (ref 3.5–5.1)
Sodium: 141 mmol/L (ref 135–145)
Total Bilirubin: 1 mg/dL (ref 0.0–1.2)
Total Protein: 6.6 g/dL (ref 6.5–8.1)

## 2023-05-30 LAB — GLUCOSE, CAPILLARY
Glucose-Capillary: 140 mg/dL — ABNORMAL HIGH (ref 70–99)
Glucose-Capillary: 325 mg/dL — ABNORMAL HIGH (ref 70–99)

## 2023-05-30 LAB — PROCALCITONIN: Procalcitonin: 0.14 ng/mL

## 2023-05-30 LAB — TROPONIN I (HIGH SENSITIVITY)
Troponin I (High Sensitivity): 114 ng/L (ref <18)
Troponin I (High Sensitivity): 104 ng/L (ref <18)

## 2023-05-30 LAB — BRAIN NATRIURETIC PEPTIDE: B Natriuretic Peptide: 386.8 pg/mL — ABNORMAL HIGH (ref 0.0–100.0)

## 2023-05-30 LAB — C-REACTIVE PROTEIN: CRP: 10.7 mg/dL — ABNORMAL HIGH (ref <1.0)

## 2023-05-30 LAB — SEDIMENTATION RATE: Sed Rate: 85 mm/h — ABNORMAL HIGH (ref 0–16)

## 2023-05-30 LAB — D-DIMER, QUANTITATIVE: D-Dimer, Quant: 1.52 ug{FEU}/mL — ABNORMAL HIGH (ref 0.00–0.50)

## 2023-05-30 MED ORDER — ACETAMINOPHEN 650 MG RE SUPP: 650.0000 mg | Freq: Four times a day (QID) | RECTAL | Status: DC | PRN

## 2023-05-30 MED ORDER — HEPARIN (PORCINE) 25000 UT/250ML-% IV SOLN
1150.0000 [IU]/h | INTRAVENOUS | Status: DC
  Administered 2023-05-30: 1400 [IU]/h via INTRAVENOUS
  Administered 2023-05-31: 1150 [IU]/h via INTRAVENOUS
  Filled 2023-05-30 (×2): qty 250

## 2023-05-30 MED ORDER — INSULIN ASPART 100 UNIT/ML IJ SOLN: 0.0000 [IU] | Freq: Every day | INTRAMUSCULAR | Status: DC

## 2023-05-30 MED ORDER — HYDROCODONE BIT-HOMATROP MBR 5-1.5 MG/5ML PO SOLN: 5.0000 mL | Freq: Four times a day (QID) | ORAL | Status: DC | PRN

## 2023-05-30 MED ORDER — IPRATROPIUM-ALBUTEROL 0.5-2.5 (3) MG/3ML IN SOLN
3.0000 mL | Freq: Once | RESPIRATORY_TRACT | Status: AC
  Administered 2023-05-30: 3 mL via RESPIRATORY_TRACT
  Filled 2023-05-30: qty 3

## 2023-05-30 MED ORDER — ACETAMINOPHEN 325 MG PO TABS: 650.0000 mg | ORAL_TABLET | Freq: Four times a day (QID) | ORAL | Status: DC | PRN

## 2023-05-30 MED ORDER — MENTHOL 3 MG MT LOZG: 1.0000 | LOZENGE | OROMUCOSAL | Status: DC | PRN

## 2023-05-30 MED ORDER — METHYLPREDNISOLONE SODIUM SUCC 40 MG IJ SOLR
40.0000 mg | Freq: Two times a day (BID) | INTRAMUSCULAR | Status: DC
  Administered 2023-05-30 – 2023-06-01 (×4): 40 mg via INTRAVENOUS
  Filled 2023-05-30 (×4): qty 1

## 2023-05-30 MED ORDER — FUROSEMIDE 10 MG/ML IJ SOLN: 20.0000 mg | Freq: Once | INTRAMUSCULAR | Status: DC

## 2023-05-30 MED ORDER — ALLOPURINOL 100 MG PO TABS
100.0000 mg | ORAL_TABLET | Freq: Every day | ORAL | Status: DC
  Administered 2023-05-31 – 2023-06-01 (×2): 100 mg via ORAL
  Filled 2023-05-30 (×2): qty 1

## 2023-05-30 MED ORDER — CARVEDILOL 3.125 MG PO TABS
3.1250 mg | ORAL_TABLET | Freq: Two times a day (BID) | ORAL | Status: DC
  Administered 2023-05-31 – 2023-06-01 (×3): 3.125 mg via ORAL
  Filled 2023-05-30 (×4): qty 1

## 2023-05-30 MED ORDER — ONDANSETRON HCL 4 MG PO TABS: 4.0000 mg | ORAL_TABLET | Freq: Four times a day (QID) | ORAL | Status: DC | PRN

## 2023-05-30 MED ORDER — BUDESONIDE 0.25 MG/2ML IN SUSP
0.2500 mg | Freq: Two times a day (BID) | RESPIRATORY_TRACT | Status: DC
  Administered 2023-05-30 – 2023-06-01 (×4): 0.25 mg via RESPIRATORY_TRACT
  Filled 2023-05-30 (×4): qty 2

## 2023-05-30 MED ORDER — ARFORMOTEROL TARTRATE 15 MCG/2ML IN NEBU
15.0000 ug | INHALATION_SOLUTION | Freq: Two times a day (BID) | RESPIRATORY_TRACT | Status: DC
  Administered 2023-05-30 – 2023-06-01 (×4): 15 ug via RESPIRATORY_TRACT
  Filled 2023-05-30 (×4): qty 2

## 2023-05-30 MED ORDER — LEVOTHYROXINE SODIUM 50 MCG PO TABS
50.0000 ug | ORAL_TABLET | Freq: Every day | ORAL | Status: DC
  Administered 2023-05-31 – 2023-06-01 (×2): 50 ug via ORAL
  Filled 2023-05-30 (×2): qty 1

## 2023-05-30 MED ORDER — METHYLPREDNISOLONE SODIUM SUCC 125 MG IJ SOLR
125.0000 mg | Freq: Once | INTRAMUSCULAR | Status: AC
  Administered 2023-05-30: 125 mg via INTRAVENOUS
  Filled 2023-05-30: qty 2

## 2023-05-30 MED ORDER — DOXAZOSIN MESYLATE 2 MG PO TABS
2.0000 mg | ORAL_TABLET | Freq: Every day | ORAL | Status: DC
  Administered 2023-06-01: 2 mg via ORAL
  Filled 2023-05-30 (×2): qty 1

## 2023-05-30 MED ORDER — INSULIN ASPART 100 UNIT/ML IJ SOLN: 0.0000 [IU] | Freq: Three times a day (TID) | INTRAMUSCULAR | Status: DC

## 2023-05-30 MED ORDER — SALINE SPRAY 0.65 % NA SOLN: 1.0000 | NASAL | Status: DC | PRN

## 2023-05-30 MED ORDER — FINASTERIDE 5 MG PO TABS
5.0000 mg | ORAL_TABLET | Freq: Every day | ORAL | Status: DC
  Administered 2023-05-31 – 2023-06-01 (×2): 5 mg via ORAL
  Filled 2023-05-30 (×2): qty 1

## 2023-05-30 MED ORDER — REVEFENACIN 175 MCG/3ML IN SOLN
175.0000 ug | Freq: Every day | RESPIRATORY_TRACT | Status: DC
  Administered 2023-05-31 – 2023-06-01 (×2): 175 ug via RESPIRATORY_TRACT
  Filled 2023-05-30 (×2): qty 3

## 2023-05-30 MED ORDER — INSULIN ASPART 100 UNIT/ML IJ SOLN
0.0000 [IU] | Freq: Three times a day (TID) | INTRAMUSCULAR | Status: DC
  Administered 2023-05-31: 5 [IU] via SUBCUTANEOUS
  Administered 2023-05-31: 2 [IU] via SUBCUTANEOUS
  Administered 2023-05-31: 5 [IU] via SUBCUTANEOUS
  Administered 2023-06-01: 3 [IU] via SUBCUTANEOUS
  Administered 2023-06-01: 5 [IU] via SUBCUTANEOUS

## 2023-05-30 MED ORDER — ATORVASTATIN CALCIUM 40 MG PO TABS
40.0000 mg | ORAL_TABLET | Freq: Every day | ORAL | Status: DC
  Administered 2023-05-31 – 2023-06-01 (×2): 40 mg via ORAL
  Filled 2023-05-30 (×2): qty 1

## 2023-05-30 MED ORDER — OXYMETAZOLINE HCL 0.05 % NA SOLN
1.0000 | NASAL | Status: DC | PRN
  Filled 2023-05-30: qty 30

## 2023-05-30 MED ORDER — PANTOPRAZOLE SODIUM 40 MG PO TBEC
40.0000 mg | DELAYED_RELEASE_TABLET | Freq: Two times a day (BID) | ORAL | Status: DC
  Administered 2023-05-30 – 2023-06-01 (×4): 40 mg via ORAL
  Filled 2023-05-30 (×4): qty 1

## 2023-05-30 MED ORDER — ONDANSETRON HCL 4 MG/2ML IJ SOLN
4.0000 mg | Freq: Four times a day (QID) | INTRAMUSCULAR | Status: DC | PRN
Start: 2023-05-30 — End: 2023-06-01

## 2023-05-30 MED ORDER — FUROSEMIDE 10 MG/ML IJ SOLN
40.0000 mg | Freq: Once | INTRAMUSCULAR | Status: AC
  Administered 2023-05-30: 40 mg via INTRAVENOUS
  Filled 2023-05-30: qty 4

## 2023-05-30 MED ORDER — INSULIN ASPART 100 UNIT/ML IJ SOLN
0.0000 [IU] | Freq: Every day | INTRAMUSCULAR | Status: DC
  Administered 2023-05-30: 4 [IU] via SUBCUTANEOUS

## 2023-05-30 MED ORDER — HEPARIN BOLUS VIA INFUSION
2500.0000 [IU] | Freq: Once | INTRAVENOUS | Status: AC
  Administered 2023-05-30: 2500 [IU] via INTRAVENOUS

## 2023-05-30 MED ORDER — ALBUTEROL SULFATE (2.5 MG/3ML) 0.083% IN NEBU: 2.5000 mg | INHALATION_SOLUTION | RESPIRATORY_TRACT | Status: DC | PRN

## 2023-05-30 MED ORDER — DEXTROMETHORPHAN POLISTIREX ER 30 MG/5ML PO SUER
30.0000 mg | Freq: Two times a day (BID) | ORAL | Status: DC
  Administered 2023-05-30 – 2023-06-01 (×4): 30 mg via ORAL
  Filled 2023-05-30 (×5): qty 5

## 2023-05-30 MED ORDER — BENZONATATE 100 MG PO CAPS
100.0000 mg | ORAL_CAPSULE | Freq: Three times a day (TID) | ORAL | Status: DC
  Administered 2023-05-30 – 2023-06-01 (×5): 100 mg via ORAL
  Filled 2023-05-30 (×5): qty 1

## 2023-05-30 MED ORDER — OXYMETAZOLINE HCL 0.05 % NA SOLN
1.0000 | NASAL | Status: DC | PRN
  Administered 2023-05-30: 1 via NASAL

## 2023-05-30 MED ORDER — GUAIFENESIN ER 600 MG PO TB12
1200.0000 mg | ORAL_TABLET | Freq: Two times a day (BID) | ORAL | Status: DC
  Administered 2023-05-30 – 2023-06-01 (×4): 1200 mg via ORAL
  Filled 2023-05-30 (×4): qty 2

## 2023-05-30 NOTE — Hospital Course (Addendum)
 Patient with PMH of LBBB, HTN, type II DM, CKD 3B, COPD, MGUS, HTN, neuropathy, recurrent nosebleed requiring cauterization x 4 in 2025, presented to the hospital with complaints of cough and shortness of breath. Patient reports that he has progressively worsening cough and dyspnea on exertion for the last few months.  Sees Dr. Judeth Horn and pulmonary.  Symptoms specifically appears to be ongoing since July 2024.  He was referred to cardiology and seeing them since December 2024.  VQ scan performed on 05/12/2023 which was negative for any probability for PE.  But did note some ILD changes. Since that visit patient reports that he has been having progressively worsening shortness of breath. He was initially able to walk around 10 feet but now he is not able to walk even 2 feet without getting short of breath. Sitting up in the chair is also tiring. He denies any orthopnea. He has chronic leg swelling which is unchanged.  Right leg is swollen more than the left. No leg pain reported. He denies any chest pain right now pleuritic chest pain before coming to the hospital. But reports that he has some chest tightness on exertion for last couple of days. For last couple of days he also reports decrease in appetite. His primary complaint is cough which gets worse when he is lays on his back. Cough is mostly dry.  Also occurs at nighttime.  No blood with the sputum. Denies any fever or chills.  Denies any recent sick contact or recent upper airway infection. He does have a history of epistaxis.  He reports that he had cauterization done 4 times during this year only with ENT. He does see Dr. Ronalee Belts for CKD.  He has been on Lasix.  Dosages were adjusted intermittently to address the swelling in the leg.  With this he presented to the ED with complaints of cough and shortness of breath.  There were some concerns with regards to possibility of a PE again and therefore the patient was started on heparin.   Brought to the hospital for admission for further workup including a VQ scan.  Assessment and Plan: Acute hypoxic respiratory failure (HCC) Asthma COPD (chronic obstructive pulmonary disease) with severe emphysema Suspected PE. Suspected pulmonary hypertension. Suspected ILD. Suspected acute on chronic diastolic heart failure Patient presents to the hospital with numbness of progressively worsening shortness of breath ongoing for last few months with acute decline in last 1 week. Chest x-ray is unremarkable other than interstitial thickening. Differential is wide, COPD/asthma flareup although less likely as the patient does not appear to have significant wheezing, ILD flareup, atypical infection, pulmonary hypertension worsening, acute on chronic congestive heart failure, sarcoidosis/ANCA vasculitis in association with his MGUS. Patient has been informed that he could have pulmonary hypertension group 2 but would be a poor candidate for treatment option given his severe emphysema. Will provide IV Lasix. Will provide IV steroids. Currently no indication to initiate antibiotic. Change inhalers to nebulizer therapy Check echocardiogram. Check CT chest. Continue VQ scan although PE is less likely in differential given a negative VQ scan on 3/5.   Check lower extremity Doppler.   Chronic cough. Will suppress the cough aggressively with Tessalon Perles, Mucinex, Delsym, Hycodan.  Decreased appetite. No abdominal symptoms. For now we will monitor.  Elevated troponin. Elevated BNP. T wave inversions on lead III. Chest tightness. Currently does not have any chest pain. On a heparin. Troponins are elevated but not in a typical ACS trajectory. Will get an echocardiogram. Suspect  chest tightness is mostly associated with respiratory illness. Will give IV Lasix for suspected acute on chronic diastolic heart failure.  CKD 3B. Monitor renal function while receiving diuresis therapy. Sees  Dr. Ronalee Belts outpatient.  MGUS. H&H relatively stable for now. Will monitor.  Epistaxis. Suspect chronic blood loss anemia. Patient reports multiple episodes of epistaxis throughout this year. Hemoglobin at baseline around 13. Hemoglobin currently around 10. Suspect the drop is in the setting of his blood loss with the epistaxis. Requiring frequent cauterization this year. At risk for further epistaxis while receiving oxygen therapy as well as on heparin. As needed Afrin ordered. As needed Ocean Spray ordered. Patient informed to notify us with regards to any episode of nosebleed.  HTN. BPH. Will continue home regimen of blood pressure medication but reduce the dose of carvedilol as the patient has complained of fatigue.  Hold Flomax as the patient is already on Cardura.  Continue Proscar. Hold losartan to allow room for volume overload management.  GERD. Continue PPI. Increasing to twice daily AC if GERD is associated with his chronic cough.  Type 2 diabetes mellitus, uncontrolled with hyperglycemia without long-term insulin use with neuropathy and nephropathy. On glimepiride and Actos. Was also on Jardiance. Currently on sliding scale only. At risk for hyperglycemia while receiving steroids. If heart failure is established, Actos should be discontinued on discharge.  Hyperlipidemia Continue statin.  Hypothyroid Continue Synthroid.  {Problem list (Optional):1}

## 2023-05-30 NOTE — ED Notes (Signed)
[place] with  cl called for transport 

## 2023-05-30 NOTE — Progress Notes (Deleted)
 PHARMACY - ANTICOAGULATION CONSULT NOTE  Pharmacy Consult for heparin  Indication: potential pulmonary embolus  Allergies  Allergen Reactions   Amlodipine Swelling   Cymbalta [Duloxetine Hcl] Other (See Comments)   Oxycodone Other (See Comments)    Patient Measurements: Height: 6\' 2"  (188 cm) Weight: 88.9 kg (196 lb) IBW/kg (Calculated) : 82.2 Heparin Dosing Weight: 88.9 kg   Vital Signs: Temp: 98.1 F (36.7 C) (03/23 1719) Temp Source: Oral (03/23 1719) BP: 155/74 (03/23 1719) Pulse Rate: 66 (03/23 1719)  Labs: Recent Labs    05/30/23 1348 05/30/23 1551  HGB 10.1*  --   HCT 30.8*  --   PLT 214  --   CREATININE 2.61*  --   TROPONINIHS 114* 104*    Estimated Creatinine Clearance: 23.6 mL/min (A) (by C-G formula based on SCr of 2.61 mg/dL (H)).   Medical History: Past Medical History:  Diagnosis Date   Anemia    Arthritis    Cataract    REMOVED BILATERAL   Colon polyps    COPD (chronic obstructive pulmonary disease) (HCC)    DIABETES MELLITUS, TYPE II    takes Metformin and Glimepiride daily   Diverticulosis    GERD (gastroesophageal reflux disease)    takes Protonix daily   History of colon polyps    HYPERLIPIDEMIA    takes Atorvastatin daily   HYPERTENSION    takes Amlodipine and Hyzaar daily   Ileus following gastrointestinal surgery (HCC) 07/22/2012   Joint pain    Peripheral neuropathy    Peripheral neuropathy    PERIPHERAL NEUROPATHY, FEET 10/06/2007   Renal disorder    VITAMIN B12 DEFICIENCY 09/06/2008   VITAMIN D DEFICIENCY 01/16/2010   takes Vitamin D daily     Assessment: Patient presenting with SOB, hx of COPD. Not on anticoagulation PTA, HgB 10.1 and PLTs 214. Concern for potential PE, V/Q scan ordered. Pharmacy consulted to dose empiric heparin for potential PE. Note hx of recurrent nosebleed that has required cauterization in the past month.   Call from MD, would like to target lower heparin range. Adjusted goal  of therapy  Goal  of Therapy:  Heparin level 0.3-0.5 units/ml Monitor platelets by anticoagulation protocol: Yes   Plan:  hx of recent significant nosebleed.  Adjust heparin infusion to 1250 units/hr Check anti-Xa level in 8 hours and daily while on heparin Continue to monitor H&H and platelets   Thank you for allowing pharmacy to be a part of this patient's care.   Signe Colt, PharmD 05/30/2023 6:33 PM  **Pharmacist phone directory can be found on [email or web address] listed under [facility]**

## 2023-05-30 NOTE — ED Triage Notes (Signed)
 In for eval of productive cough x1 week. PMH COPD. Completed steroids 1 week ago. Denies fever, chills, nausea or vomiting. Decreased appetite.

## 2023-05-30 NOTE — Progress Notes (Addendum)
 PHARMACY - ANTICOAGULATION CONSULT NOTE  Pharmacy Consult for heparin  Indication: potential pulmonary embolus  Allergies  Allergen Reactions   Amlodipine Swelling   Cymbalta [Duloxetine Hcl] Other (See Comments)   Oxycodone Other (See Comments)    Patient Measurements: Height: 6\' 2"  (188 cm) Weight: 88.9 kg (196 lb) IBW/kg (Calculated) : 82.2 Heparin Dosing Weight: 88.9 kg   Vital Signs: Temp: 98.8 F (37.1 C) (03/23 1315) Temp Source: Oral (03/23 1313) BP: 130/57 (03/23 1315) Pulse Rate: 71 (03/23 1315)  Labs: Recent Labs    05/30/23 1348  HGB 10.1*  HCT 30.8*  PLT 214  CREATININE 2.61*  TROPONINIHS 114*    Estimated Creatinine Clearance: 23.6 mL/min (A) (by C-G formula based on SCr of 2.61 mg/dL (H)).   Medical History: Past Medical History:  Diagnosis Date   Anemia    Arthritis    Cataract    REMOVED BILATERAL   Colon polyps    COPD (chronic obstructive pulmonary disease) (HCC)    DIABETES MELLITUS, TYPE II    takes Metformin and Glimepiride daily   Diverticulosis    GERD (gastroesophageal reflux disease)    takes Protonix daily   History of colon polyps    HYPERLIPIDEMIA    takes Atorvastatin daily   HYPERTENSION    takes Amlodipine and Hyzaar daily   Ileus following gastrointestinal surgery (HCC) 07/22/2012   Joint pain    Peripheral neuropathy    Peripheral neuropathy    PERIPHERAL NEUROPATHY, FEET 10/06/2007   Renal disorder    VITAMIN B12 DEFICIENCY 09/06/2008   VITAMIN D DEFICIENCY 01/16/2010   takes Vitamin D daily     Assessment: Patient presenting with SOB, hx of COPD. Not on anticoagulation PTA, HgB 10.1 and PLTs 214. Concern for potential PE, V/Q scan ordered. Pharmacy consulted to dose empiric heparin for potential PE. Note hx of recurrent nosebleed that has required cauterization in the past month.   Addendum  MD requested lower goal due to epitaxis.  Goal of Therapy:  Heparin Level: 0.3-0.5 Monitor platelets by  anticoagulation protocol: Yes   Plan:  Give 2500 units bolus x 1 - small bolus given hx of recent significant nosebleed.  Start heparin infusion at 1400 units/hr Check anti-Xa level in 8 hours and daily while on heparin Continue to monitor H&H and platelets  Estill Batten, PharmD, BCCCP  05/30/2023,3:15 PM

## 2023-05-30 NOTE — H&P (Signed)
 History and Physical  Patient: Nicholas Patton JYN:[id number] DOB: 1935/01/08 DOA: 05/30/2023 DOS: the patient was seen and examined on 05/30/2023 Patient coming from: Home  Chief Complaint: Cough progressively worsening for last few months shortness of breath and fatigue.  Decreased appetite for the last couple of days.  Chest tightness on exertion.   HPI: Patient with PMH of LBBB, HTN, type II DM, CKD 3B, COPD, MGUS, HTN, neuropathy, recurrent nosebleed requiring cauterization x 4 in 2025, presented to the hospital with complaints of cough and shortness of breath. Patient reports that he has progressively worsening cough and dyspnea on exertion for the last few months.  Sees Dr. Judeth Horn and pulmonary.  Symptoms specifically appears to be ongoing since July 2024.  He was referred to cardiology and seeing them since December 2024.  VQ scan performed on 05/12/2023 which was negative for any probability for PE.  But did note some ILD changes. Since that visit patient reports that he has been having progressively worsening shortness of breath. He was initially able to walk around 10 feet but now he is not able to walk even 2 feet without getting short of breath. Sitting up in the chair is also tiring. He denies any orthopnea. He has chronic leg swelling which is unchanged.  Right leg is swollen more than the left. No leg pain reported. He denies any chest pain right now pleuritic chest pain before coming to the hospital. But reports that he has some chest tightness on exertion for last couple of days. For last couple of days he also reports decrease in appetite. His primary complaint is cough which gets worse when he is lays on his back. Cough is mostly dry.  Also occurs at nighttime.  No blood with the sputum. Denies any fever or chills.  Denies any recent sick contact or recent upper airway infection. He does have a history of epistaxis.  He reports that he had cauterization done 4 times during this  year only with ENT. He does see Dr. Ronalee Belts for CKD.  He has been on Lasix.  Dosages were adjusted intermittently to address the swelling in the leg.  With this he presented to the ED with complaints of cough and shortness of breath.  There were some concerns with regards to possibility of a PE again and therefore the patient was started on heparin.  Brought to the hospital for admission for further workup including a VQ scan.  Assessment and Plan: Acute hypoxic respiratory failure (HCC) Asthma COPD (chronic obstructive pulmonary disease) with severe emphysema Suspected PE. Suspected pulmonary hypertension. Suspected ILD. Suspected acute on chronic diastolic heart failure Patient presents to the hospital with numbness of progressively worsening shortness of breath ongoing for last few months with acute decline in last 1 week. Chest x-ray is unremarkable other than interstitial thickening. Differential is wide, COPD/asthma flareup although less likely as the patient does not appear to have significant wheezing, ILD flareup, atypical infection, pulmonary hypertension worsening, acute on chronic congestive heart failure, sarcoidosis/ANCA vasculitis in association with his MGUS. Patient has been informed that he could have pulmonary hypertension group 2 but would be a poor candidate for treatment option given his severe emphysema. Will provide IV Lasix. Will provide IV steroids. Currently no indication to initiate antibiotic. Change inhalers to nebulizer therapy Check echocardiogram. Check CT chest. Continue VQ scan although PE is less likely in differential given a negative VQ scan on 3/5.   Check lower extremity Doppler.   Chronic cough. Will suppress  the cough aggressively with Tessalon Perles, Mucinex, Delsym, Hycodan.  Decreased appetite. No abdominal symptoms. For now we will monitor.  Elevated troponin. Elevated BNP. T wave inversions on lead III. Chest tightness. Currently  does not have any chest pain. On a heparin. Troponins are elevated but not in a typical ACS trajectory. Will get an echocardiogram. Suspect chest tightness is mostly associated with respiratory illness. Will give IV Lasix for suspected acute on chronic diastolic heart failure.  CKD 3B. Monitor renal function while receiving diuresis therapy. Sees Dr. Ronalee Belts outpatient.  MGUS. H&H relatively stable for now. Will monitor.  Epistaxis. Suspect chronic blood loss anemia. Patient reports multiple episodes of epistaxis throughout this year. Hemoglobin at baseline around 13. Hemoglobin currently around 10. Suspect the drop is in the setting of his blood loss with the epistaxis. Requiring frequent cauterization this year. At risk for further epistaxis while receiving oxygen therapy as well as on heparin. As needed Afrin ordered. As needed Ocean Spray ordered. Patient informed to notify us with regards to any episode of nosebleed.  HTN. BPH. Will continue home regimen of blood pressure medication but reduce the dose of carvedilol as the patient has complained of fatigue.  Hold Flomax as the patient is already on Cardura.  Continue Proscar. Hold losartan to allow room for volume overload management.  GERD. Continue PPI. Increasing to twice daily AC if GERD is associated with his chronic cough.  Type 2 diabetes mellitus, uncontrolled with hyperglycemia without long-term insulin use with neuropathy and nephropathy. On glimepiride and Actos. Was also on Jardiance. Currently on sliding scale only. At risk for hyperglycemia while receiving steroids. If heart failure is established, Actos should be discontinued on discharge.  Hyperlipidemia Continue statin.  Hypothyroid Continue Synthroid.      Advance Care Planning:   Code Status: Do not attempt resuscitation (DNR) PRE-ARREST INTERVENTIONS DESIRED patient's daughter was present at the time of conversation. Consults:  None  Prior to Admission medications   Medication Sig Start Date End Date Taking? Authorizing Provider  glimepiride (AMARYL) 2 MG tablet TAKE 2 TABLETS BY MOUTH DAILY  BEFORE BREAKFAST 05/25/23   Corwin Levins, MD  albuterol (VENTOLIN HFA) 108 (90 Base) MCG/ACT inhaler INHALE 2 PUFFS BY MOUTH EVERY 6 HOURS AS NEEDED FOR WHEEZING FOR SHORTNESS OF BREATH 08/26/22   Hunsucker, Lesia Sago, MD  allopurinol (ZYLOPRIM) 100 MG tablet Take 1 tablet (100 mg total) by mouth daily. 01/09/22   Rodolph Bong, MD  atorvastatin (LIPITOR) 40 MG tablet Take 1 tablet (40 mg total) by mouth daily. 01/01/23   Corwin Levins, MD  BREO ELLIPTA 200-25 MCG/ACT AEPB USE 1 INHALATION BY MOUTH ONCE  DAILY AT THE SAME TIME EACH DAY 03/18/23   Hunsucker, Lesia Sago, MD  carvedilol (COREG) 12.5 MG tablet TAKE 1 TABLET BY MOUTH TWICE  DAILY WITH MEALS 07/06/22   Corwin Levins, MD  doxazosin (CARDURA) 2 MG tablet Take 1 tablet (2 mg total) by mouth daily. 10/04/20   Corwin Levins, MD  finasteride (PROSCAR) 5 MG tablet TAKE 1 TABLET BY MOUTH DAILY 09/27/22   Bjorn Pippin, MD  furosemide (LASIX) 40 MG tablet Take 1 tablet (40 mg total) by mouth daily. 02/22/23 05/23/23  Maisie Fus, MD  levothyroxine (SYNTHROID) 50 MCG tablet Take 1 tablet (50 mcg total) by mouth daily. 10/21/22   Corwin Levins, MD  losartan (COZAAR) 100 MG tablet TAKE 1 TABLET BY MOUTH DAILY 04/05/23   Corwin Levins, MD  pantoprazole (  PROTONIX) 40 MG tablet Take 1 tablet (40 mg total) by mouth daily. 01/01/23   Corwin Levins, MD  pioglitazone (ACTOS) 30 MG tablet Take 30 mg by mouth daily. 03/24/23   [provider]  tamsulosin (FLOMAX) 0.4 MG CAPS capsule Take 1 capsule (0.4 mg total) by mouth daily. 01/01/23   Corwin Levins, MD  vitamin B-12 (CYANOCOBALAMIN) 100 MCG tablet Take 100 mcg by mouth daily.    [provider]    Past Medical History:  Diagnosis Date   Anemia    Arthritis    Cataract    REMOVED BILATERAL   Colon polyps    COPD (chronic  obstructive pulmonary disease) (HCC)    DIABETES MELLITUS, TYPE II    takes Metformin and Glimepiride daily   Diverticulosis    GERD (gastroesophageal reflux disease)    takes Protonix daily   History of colon polyps    HYPERLIPIDEMIA    takes Atorvastatin daily   HYPERTENSION    takes Amlodipine and Hyzaar daily   Ileus following gastrointestinal surgery (HCC) 07/22/2012   Joint pain    Peripheral neuropathy    Peripheral neuropathy    PERIPHERAL NEUROPATHY, FEET 10/06/2007   Renal disorder    VITAMIN B12 DEFICIENCY 09/06/2008   VITAMIN D DEFICIENCY 01/16/2010   takes Vitamin D daily   Past Surgical History:  Procedure Laterality Date   CATARACT EXTRACTION     both eyes   CHOLECYSTECTOMY N/A 07/18/2012   Procedure: LAPAROSCOPIC CHOLECYSTECTOMY WITH INTRAOPERATIVE CHOLANGIOGRAM;  Surgeon: Liz Malady, MD;  Location: [facility] OR;  Service: General;  Laterality: N/A;   COLONOSCOPY     EYE SURGERY Right 01/2023   POLYPECTOMY     POSTERIOR CERVICAL FUSION/FORAMINOTOMY N/A 06/27/2015   Procedure: Posterior Cervical Fusion with lateral mass fixation Cervical four to cervical seven;  Surgeon: Tia Alert, MD;  Location: [facility];  Service: Neurosurgery;  Laterality: N/A;   TOTAL KNEE ARTHROPLASTY  2001   Left   TURP VAPORIZATION     Social History:  reports that he quit smoking about 39 years ago. His smoking use included cigarettes. He started smoking about 70 years ago. He has a 31 pack-year smoking history. He has never been exposed to tobacco smoke. He has never used smokeless tobacco. He reports that he does not drink alcohol and does not use drugs. Allergies  Allergen Reactions   Amlodipine Swelling   Cymbalta [Duloxetine Hcl] Other (See Comments)   Oxycodone Other (See Comments)   Family History  Problem Relation Age of Onset   Diabetes Mellitus II Mother    Lung cancer Brother    Colon cancer Neg Hx    Esophageal cancer Neg Hx    Rectal cancer Neg Hx    Stomach  cancer Neg Hx    Physical Exam: Vitals:   05/30/23 1315 05/30/23 1325 05/30/23 1610 05/30/23 1719  BP: (!) 130/57  (!) 135/91 (!) 155/74  Pulse: 71  70 66  Resp: 18 (!) 22 (!) 29 20  Temp: 98.8 F (37.1 C)  98.8 F (37.1 C) 98.1 F (36.7 C)  TempSrc:   Oral Oral  SpO2: (!) 88%  98% 97%  Weight:      Height:       General: Appear in moderate distress; no visible Abnormal Neck Mass Or lumps, Conjunctiva normal Cardiovascular: S1 and S2 Present, no Murmur, Respiratory: good respiratory effort, Bilateral Air entry present and bilateral crackles, no wheezes Abdomen: Bowel Sound present,  Non tender  Extremities: Right more than left pedal edema Neurology: alert and oriented to time, place, and person Gait not checked due to patient safety concerns   Data Reviewed: I have reviewed ED notes, Vitals, Lab results and outpatient records. Since last encounter, pertinent lab results CBC and BMP   . I have ordered test including CBC and BMP. I have ordered imaging CT chest, echocardiogram, lower extremity Doppler. I have independently visualized and interpreted EKG which showed EKG: normal sinus rhythm, nonspecific ST and T waves changes.   Family Communication: Daughter at bedside  Author: Lynden Oxford, MD 05/30/2023 7:36 PM For on call review [email or web address].

## 2023-05-30 NOTE — ED Notes (Signed)
 RT called to assess patient due to decreased SAT. When I arrived, MD was at bedside, lowest SAT noted was 78%. Placed on 2L and slowly increased SAT to 95%. BBS fairly clear, history of COPD.

## 2023-05-30 NOTE — ED Provider Notes (Signed)
 [place] [facility] AT [facility] Provider Note   CSN: [id number] Arrival date & time: 05/30/23  1258     History  Chief Complaint  Patient presents with   Cough    Nicholas Patton is a 89 y.o. male.   Cough Associated symptoms: shortness of breath      89 year old male with medical history significant for COPD not on home oxygen, diabetes mellitus, HTN, HLD, presenting to the emergency department with cough and shortness of breath and dyspnea on exertion for the past week.  The patient endorses a cough productive of sputum.  He has had worsening shortness of breath and has been unable to take more than a few steps without getting out of breath.  No lower extremity swelling, no weight gain.  He recently contributed a course of steroids a week ago.  He has been following outpatient for management of his COPD.  He is not on home oxygen at baseline.  He denies any fevers or chills.  He denies any chest pain although he does endorse some chest tightness across his chest.  On chart review, the patient was seen by [place] [facility] care in February 2025 during which time he was described to have had chronic dyspnea on exertion from months to years, had been told he had COPD in the past but had never been tested via spirometry, did have improvement with bronchodilators.  He has been started on Trelegy.  Per pulmonary notes, PFTs have been suggestive there is asthma as the driver of his symptoms.  Some concern for deconditioning.  Previously improved with ICS/LABA therapy.  Home Medications Prior to Admission medications   Medication Sig Start Date End Date Taking? Authorizing Provider  glimepiride (AMARYL) 2 MG tablet TAKE 2 TABLETS BY MOUTH DAILY  BEFORE BREAKFAST 05/25/23   Corwin Levins, MD  albuterol (VENTOLIN HFA) 108 (90 Base) MCG/ACT inhaler INHALE 2 PUFFS BY MOUTH EVERY 6 HOURS AS NEEDED FOR WHEEZING FOR SHORTNESS OF BREATH 08/26/22   Hunsucker, Lesia Sago, MD   allopurinol (ZYLOPRIM) 100 MG tablet Take 1 tablet (100 mg total) by mouth daily. 01/09/22   Rodolph Bong, MD  atorvastatin (LIPITOR) 40 MG tablet Take 1 tablet (40 mg total) by mouth daily. 01/01/23   Corwin Levins, MD  BREO ELLIPTA 200-25 MCG/ACT AEPB USE 1 INHALATION BY MOUTH ONCE  DAILY AT THE SAME TIME EACH DAY 03/18/23   Hunsucker, Lesia Sago, MD  carvedilol (COREG) 12.5 MG tablet TAKE 1 TABLET BY MOUTH TWICE  DAILY WITH MEALS 07/06/22   Corwin Levins, MD  doxazosin (CARDURA) 2 MG tablet Take 1 tablet (2 mg total) by mouth daily. 10/04/20   Corwin Levins, MD  finasteride (PROSCAR) 5 MG tablet TAKE 1 TABLET BY MOUTH DAILY 09/27/22   Bjorn Pippin, MD  furosemide (LASIX) 40 MG tablet Take 1 tablet (40 mg total) by mouth daily. 02/22/23 05/23/23  Maisie Fus, MD  levothyroxine (SYNTHROID) 50 MCG tablet Take 1 tablet (50 mcg total) by mouth daily. 10/21/22   Corwin Levins, MD  losartan (COZAAR) 100 MG tablet TAKE 1 TABLET BY MOUTH DAILY 04/05/23   Corwin Levins, MD  pantoprazole (PROTONIX) 40 MG tablet Take 1 tablet (40 mg total) by mouth daily. 01/01/23   Corwin Levins, MD  tamsulosin (FLOMAX) 0.4 MG CAPS capsule Take 1 capsule (0.4 mg total) by mouth daily. 01/01/23   Corwin Levins, MD  vitamin B-12 (CYANOCOBALAMIN) 100 MCG tablet Take  100 mcg by mouth daily.    [provider]      Allergies    Amlodipine, Cymbalta [duloxetine hcl], and Oxycodone    Review of Systems   Review of Systems  Respiratory:  Positive for cough, chest tightness and shortness of breath.   All other systems reviewed and are negative.   Physical Exam Updated Vital Signs BP (!) 130/57 (BP Location: Right Arm)   Pulse 71   Temp 98.8 F (37.1 C)   Resp (!) 22   Ht 6\' 2"  (1.88 m)   Wt 88.9 kg   SpO2 (!) 88%   BMI 25.16 kg/m  Physical Exam Vitals and nursing note reviewed.  Constitutional:      General: He is not in acute distress. HENT:     Head: Normocephalic and atraumatic.  Eyes:      Conjunctiva/sclera: Conjunctivae normal.     Pupils: Pupils are equal, round, and reactive to light.  Cardiovascular:     Rate and Rhythm: Normal rate and regular rhythm.  Pulmonary:     Effort: Pulmonary effort is normal. No respiratory distress.     Comments: Expiratory wheezing present on coughing otherwise lungs were clear to auscultation bilaterally Abdominal:     General: There is no distension.     Tenderness: There is no guarding.  Musculoskeletal:        General: No deformity or signs of injury.     Cervical back: Neck supple.  Skin:    Findings: No lesion or rash.  Neurological:     General: No focal deficit present.     Mental Status: He is alert. Mental status is at baseline.     ED Results / Procedures / Treatments   Labs (all labs ordered are listed, but only abnormal results are displayed) Labs Reviewed  CBC WITH DIFFERENTIAL/PLATELET - Abnormal; Notable for the following components:      Result Value   RBC 3.35 (*)    Hemoglobin 10.1 (*)    HCT 30.8 (*)    All other components within normal limits  COMPREHENSIVE METABOLIC PANEL - Abnormal; Notable for the following components:   Glucose, Bld 164 (*)    BUN 46 (*)    Creatinine, Ser 2.61 (*)    GFR, Estimated 23 (*)    All other components within normal limits  BRAIN NATRIURETIC PEPTIDE - Abnormal; Notable for the following components:   B Natriuretic Peptide 386.8 (*)    All other components within normal limits  D-DIMER, QUANTITATIVE - Abnormal; Notable for the following components:   D-Dimer, Quant 1.52 (*)    All other components within normal limits  TROPONIN I (HIGH SENSITIVITY) - Abnormal; Notable for the following components:   Troponin I (High Sensitivity) 114 (*)    All other components within normal limits  RESP PANEL BY RT-PCR (RSV, FLU A&B, COVID)  [id number]  TROPONIN I (HIGH SENSITIVITY)    EKG EKG Interpretation Date/Time:  Sunday May 30 2023 14:00:49 EDT Ventricular Rate:  72 PR  Interval:  189 QRS Duration:  149 QT Interval:  461 QTC Calculation: 505 R Axis:   263  Text Interpretation: Sinus rhythm RBBB and LAFB Confirmed by Ernie Avena ([id number]) on 05/30/2023 2:45:31 PM  Radiology DG Chest Port 1 View Result Date: 05/30/2023 CLINICAL DATA:  Shortness of breath with history of COPD EXAM: PORTABLE CHEST 1 VIEW COMPARISON:  05/12/2023 FINDINGS: Incompletely imaged cervical spine fixation. Midline trachea. Normal heart size. Atherosclerosis in the transverse aorta.  Tortuous thoracic aorta. No pleural effusion or pneumothorax. Hyperinflation. Moderate, increased pulmonary interstitial thickening and coarsening. No lobar consolidation. Similar left base scarring. IMPRESSION: Hyperinflation with increased interstitial thickening/coarsening. This could all be the sequelae of smoking/chronic bronchitis. Cannot exclude superimposed pulmonary venous congestion. Aortic Atherosclerosis (ICD10-I70.0). Electronically Signed   By: Jeronimo Greaves M.D.   On: 05/30/2023 13:51    Procedures .Critical Care  Performed by: Ernie Avena, MD Authorized by: Ernie Avena, MD   Critical care provider statement:    Critical care time (minutes):  30   Critical care was necessary to treat or prevent imminent or life-threatening deterioration of the following conditions:  Respiratory failure   Critical care was time spent personally by me on the following activities:  Development of treatment plan with patient or surrogate, discussions with consultants, evaluation of patient's response to treatment, examination of patient, ordering and review of laboratory studies, ordering and review of radiographic studies, ordering and performing treatments and interventions, pulse oximetry, re-evaluation of patient's condition and review of old charts   Care discussed with: admitting provider       Medications Ordered in ED Medications  methylPREDNISolone sodium succinate (SOLU-MEDROL) 125 mg/2 mL  injection 125 mg (has no administration in time range)  heparin ADULT infusion 100 units/mL (25000 units/24mL) (has no administration in time range)  heparin bolus via infusion 2,500 Units (has no administration in time range)  ipratropium-albuterol (DUONEB) 0.5-2.5 (3) MG/3ML nebulizer solution 3 mL (3 mLs Nebulization Given 05/30/23 1450)    ED Course/ Medical Decision Making/ A&P                                 Medical Decision Making Amount and/or Complexity of Data Reviewed Labs: ordered. Radiology: ordered.  Risk Prescription drug management. Decision regarding hospitalization.     89 year old male with medical history significant for COPD not on home oxygen, diabetes mellitus, HTN, HLD, presenting to the emergency department with cough and shortness of breath and dyspnea on exertion for the past week.  The patient endorses a cough productive of sputum.  He has had worsening shortness of breath and has been unable to take more than a few steps without getting out of breath.  No lower extremity swelling, no weight gain.  He recently contributed a course of steroids a week ago.  He has been following outpatient for management of his COPD.  He is not on home oxygen at baseline.  He denies any fevers or chills.  He denies any chest pain although he does endorse some chest tightness across his chest.  On chart review, the patient was seen by [place] pulmonary care in February 2025 during which time he was described to have had chronic dyspnea on exertion from months to years, had been told he had COPD in the past but had never been tested via spirometry, did have improvement with bronchodilators.  He has been started on Trelegy.  Per pulmonary notes, PFTs have been suggestive there is asthma as the driver of his symptoms.  Some concern for deconditioning.  Previously improved with ICS/LABA therapy.  On arrival, the patient was afebrile, temperature 98.8, not tachycardic or tachypneic  (patient on a beta-blocker), blood pressure 130/57, saturating 88% on room air.  On my evaluation, the patient coughed and desaturated with a good waveform down to 78%.  He was placed on 2 L O2 via nasal cannula with subsequent improvement.  Differential diagnosis  includes viral infection, asthma/COPD exacerbation, pleural effusion, pulmonary edema considered less likely given exam, pneumonia, PE.  EKG: Sinus rhythm, vent rate 72, no STEMI  CXR:  IMPRESSION:  Hyperinflation with increased interstitial thickening/coarsening.  This could all be the sequelae of smoking/chronic bronchitis. Cannot  exclude superimposed pulmonary venous congestion.    Aortic Atherosclerosis (ICD10-I70.0).    Labs: Initial cardiac troponin 114, dimer 1.52, BNP 387. Repeat trop pending. CMP with CKD Cr 2.61 at baseline, WBC normal.  Cannot obtain CTA PE study due to poor GFR. Pt recently had a negative VQ scan a few weeks ago however presentation raises concern for acute PE-> Heparin gtt ordered. Given hypoxic respiratory failure, medicine consulted for admission, Dr. Nicoletta Ba accepting.   Final Clinical Impression(s) / ED Diagnoses Final diagnoses:  Acute respiratory failure with hypoxia (HCC)  Positive D dimer  Elevated troponin    Rx / DC Orders ED Discharge Orders     None         Ernie Avena, MD 05/30/23 1525

## 2023-05-31 ENCOUNTER — Inpatient Hospital Stay (HOSPITAL_COMMUNITY)

## 2023-05-31 DIAGNOSIS — N179 Acute kidney failure, unspecified: Secondary | ICD-10-CM | POA: Diagnosis not present

## 2023-05-31 DIAGNOSIS — R609 Edema, unspecified: Secondary | ICD-10-CM

## 2023-05-31 DIAGNOSIS — N185 Chronic kidney disease, stage 5: Secondary | ICD-10-CM | POA: Diagnosis not present

## 2023-05-31 DIAGNOSIS — I509 Heart failure, unspecified: Secondary | ICD-10-CM

## 2023-05-31 DIAGNOSIS — J9601 Acute respiratory failure with hypoxia: Secondary | ICD-10-CM | POA: Diagnosis not present

## 2023-05-31 DIAGNOSIS — I10 Essential (primary) hypertension: Secondary | ICD-10-CM | POA: Diagnosis not present

## 2023-05-31 DIAGNOSIS — R7989 Other specified abnormal findings of blood chemistry: Secondary | ICD-10-CM | POA: Diagnosis not present

## 2023-05-31 LAB — ECHOCARDIOGRAM COMPLETE
AR max vel: 2.86 cm2
AV Area VTI: 2.68 cm2
AV Area mean vel: 2.62 cm2
AV Mean grad: 2 mmHg
AV Peak grad: 4.8 mmHg
Ao pk vel: 1.1 m/s
Area-P 1/2: 2.07 cm2
Height: 74 in
MV VTI: 2.06 cm2
S' Lateral: 2.5 cm
Weight: 2980.62 [oz_av]

## 2023-05-31 LAB — COMPREHENSIVE METABOLIC PANEL
ALT: 22 U/L (ref 0–44)
AST: 21 U/L (ref 15–41)
Albumin: 2.8 g/dL — ABNORMAL LOW (ref 3.5–5.0)
Alkaline Phosphatase: 66 U/L (ref 38–126)
Anion gap: 13 (ref 5–15)
BUN: 55 mg/dL — ABNORMAL HIGH (ref 8–23)
CO2: 19 mmol/L — ABNORMAL LOW (ref 22–32)
Calcium: 9.3 mg/dL (ref 8.9–10.3)
Chloride: 106 mmol/L (ref 98–111)
Creatinine, Ser: 3.15 mg/dL — ABNORMAL HIGH (ref 0.61–1.24)
GFR, Estimated: 18 mL/min — ABNORMAL LOW (ref >60)
Glucose, Bld: 236 mg/dL — ABNORMAL HIGH (ref 70–99)
Potassium: 3.8 mmol/L (ref 3.5–5.1)
Sodium: 138 mmol/L (ref 135–145)
Total Bilirubin: 1.2 mg/dL (ref 0.0–1.2)
Total Protein: 6.1 g/dL — ABNORMAL LOW (ref 6.5–8.1)

## 2023-05-31 LAB — CBC
HCT: 32.5 % — ABNORMAL LOW (ref 39.0–52.0)
Hemoglobin: 10.5 g/dL — ABNORMAL LOW (ref 13.0–17.0)
MCH: 29.6 pg (ref 26.0–34.0)
MCHC: 32.3 g/dL (ref 30.0–36.0)
MCV: 91.5 fL (ref 80.0–100.0)
Platelets: 198 10*3/uL (ref 150–400)
RBC: 3.55 MIL/uL — ABNORMAL LOW (ref 4.22–5.81)
RDW: 14.9 % (ref 11.5–15.5)
WBC: 3.9 10*3/uL — ABNORMAL LOW (ref 4.0–10.5)
nRBC: 0 % (ref 0.0–0.2)

## 2023-05-31 LAB — GLUCOSE, CAPILLARY
Glucose-Capillary: 235 mg/dL — ABNORMAL HIGH (ref 70–99)
Glucose-Capillary: 141 mg/dL — ABNORMAL HIGH (ref 70–99)
Glucose-Capillary: 210 mg/dL — ABNORMAL HIGH (ref 70–99)
Glucose-Capillary: 211 mg/dL — ABNORMAL HIGH (ref 70–99)
Glucose-Capillary: 200 mg/dL — ABNORMAL HIGH (ref 70–99)

## 2023-05-31 LAB — HEPARIN LEVEL (UNFRACTIONATED)
Heparin Unfractionated: 0.94 [IU]/mL — ABNORMAL HIGH (ref 0.30–0.70)
Heparin Unfractionated: 1.04 [IU]/mL — ABNORMAL HIGH (ref 0.30–0.70)

## 2023-05-31 MED ORDER — TECHNETIUM TO 99M ALBUMIN AGGREGATED
4.3200 | Freq: Once | INTRAVENOUS | Status: AC | PRN
  Administered 2023-05-31: 4.32 via INTRAVENOUS

## 2023-05-31 MED ORDER — HEPARIN (PORCINE) 25000 UT/250ML-% IV SOLN: 850.0000 [IU]/h | INTRAVENOUS | Status: DC

## 2023-05-31 NOTE — Progress Notes (Signed)
 PHARMACY - ANTICOAGULATION CONSULT NOTE  Pharmacy Consult for Heparin  Indication: rule out pulmonary embolus  Allergies  Allergen Reactions   Amlodipine Swelling   Cymbalta [Duloxetine Hcl] Other (See Comments)   Oxycodone Other (See Comments)    Patient Measurements: Height: 6\' 2"  (188 cm) Weight: 84.5 kg (186 lb 4.6 oz) IBW/kg (Calculated) : 82.2 Heparin Dosing Weight: 88.9 kg   Vital Signs: Temp: 98.5 F (36.9 C) (03/24 0806) Temp Source: Oral (03/24 0600) BP: 131/69 (03/24 0806) Pulse Rate: 52 (03/24 0806)  Labs: Recent Labs    05/30/23 1348 05/30/23 1551 05/31/23 0043 05/31/23 0859 05/31/23 1139  HGB 10.1*  --   --  10.5*  --   HCT 30.8*  --   --  32.5*  --   PLT 214  --   --  198  --   HEPARINUNFRC  --   --  0.94*  --  1.04*  CREATININE 2.61*  --   --  3.15*  --   TROPONINIHS 114* 104*  --   --   --     Estimated Creatinine Clearance: 19.6 mL/min (A) (by C-G formula based on SCr of 3.15 mg/dL (H)).   Medical History: Past Medical History:  Diagnosis Date   Anemia    Arthritis    Cataract    REMOVED BILATERAL   Colon polyps    COPD (chronic obstructive pulmonary disease) (HCC)    DIABETES MELLITUS, TYPE II    takes Metformin and Glimepiride daily   Diverticulosis    GERD (gastroesophageal reflux disease)    takes Protonix daily   History of colon polyps    HYPERLIPIDEMIA    takes Atorvastatin daily   HYPERTENSION    takes Amlodipine and Hyzaar daily   Ileus following gastrointestinal surgery (HCC) 07/22/2012   Joint pain    Peripheral neuropathy    Peripheral neuropathy    PERIPHERAL NEUROPATHY, FEET 10/06/2007   Renal disorder    VITAMIN B12 DEFICIENCY 09/06/2008   VITAMIN D DEFICIENCY 01/16/2010   takes Vitamin D daily     Assessment: Patient presenting with SOB, hx of COPD. Not on anticoagulation PTA, HgB 10.1 and PLTs 214. Concern for potential PE, V/Q scan ordered. Pharmacy consulted to dose empiric heparin for potential PE. Note  hx of recurrent nosebleed that has required cauterization in the past month.   MD requested lower goal due to epitaxis.  3/24 AM update:  Heparin level supra-therapeutic at 1.04 with heparin running at 1150 units/hour. RN reports patient had scant blood in his sputum this AM (just one instance) and previoulsy had bleeding from his IV site- a dressing was applied and patient went to VQ scan. Upon return to the floor, patient continued to have bleeding so RN paused the heparin infusion and planning to replace the patient's IV.   Goal of Therapy:  Heparin Level: 0.3-0.5 units/mL Monitor platelets by anticoagulation protocol: Yes   Plan:  Hold heparin until 1330 on 3/24  Dec heparin to 850 units/hr Heparin level 8 hours after restart  F/U VQ scan result    Jani Gravel, PharmD Clinical Pharmacist  05/31/2023 12:50 PM

## 2023-05-31 NOTE — Plan of Care (Signed)
 Patient AAOx4. Heparin drip d/c'd this shift. LBM today. No complaints of pain. New PIV to RFA placed. Safety precautions maintained.   ExOx: At rest on RA saturating 97% Ambulatory on RA saturating 88%  Problem: Education: Goal: Knowledge of General Education information will improve Description: Including pain rating scale, medication(s)/side effects and non-pharmacologic comfort measures Outcome: Progressing   Problem: Activity: Goal: Risk for activity intolerance will decrease Outcome: Progressing   Problem: Nutrition: Goal: Adequate nutrition will be maintained Outcome: Progressing   Problem: Pain Managment: Goal: General experience of comfort will improve and/or be controlled Outcome: Progressing

## 2023-05-31 NOTE — Consult Note (Addendum)
 Cardiology Consultation   Patient ID: Nicholas Patton MRN: [id number]; DOB: February 05, 1935  Admit date: 05/30/2023 Date of Consult: 05/31/2023  PCP:  Nicholas Levins, MD   [facility] Providers Cardiologist:  Nicholas Fus, MD        Patient Profile:   Nicholas Patton is a 89 y.o. male with a hx of chronic dyspnea on exertion, RBBB, hypertension, type 2 diabetes mellitus, CKD stage IIIb/ IV, COPD/ asthma, and MGUS  who is being seen 05/31/2023 for the evaluation of elevated troponin at the request of Nicholas Patton.  History of Present Illness:   Patient was recently referred to Nicholas Patton in 02/2023 for further evaluation of dyspnea on exertion. Echo in 2016 showed normal LV function and moderate pulmonary hypertension.  Most recent echocardiogram in 2021 showed EF 60 to 65%, normal LV function, no RWMA, severe asymmetric LVH of the basal septal segment, moderate concentric LVH, G1 DD, normal RV systolic function, moderately elevated PASP, mild mitral valve regurgitation, mild calcification of the aortic valve.  Prior PFTs were suggestive of asthma/ emphysema.  He was last seen in the office on 02/22/2023 and was stable from a cardiac standpoint.  Myoview was ordered for further evaluation which was low risk with no evidence of reversible ischemia. BNP was normal.   Patient last seen in clinic on 04/22/23 and was noted to have stable symptoms including exertional dyspnea, particularly with walking longer distances.   He is also closely followed by pulmonology, was seen in February with notes indicating remote COPD vs reactive airway disease diagnosis. Patient transitioned to Trelegy at that visit and also given Prednisone taper due to dyspnea with cough.  Patient now admitted with progressive exertional dyspnea and cough, worsening over the last week, now only able to walk a few feet before developing shortness of breath. Per family, cough seemed to worsen after starting Trelegy and  patient has since returned to prior regimen. Despite this, his dyspnea persisted->worsened. Also reports some chest discomfort/tightness associated with exertion and resulting dyspnea, feels that albuterol provides relief.   Given symptoms and concern for possible PE, patient started on heparin and admitted for V/Q scan. In the ED, CXR with hyperinflation and increased interstitial thickening/coarsening. CT chest with severe emphysematous changes. Resp virus panel negative. CBC with anemia, HGB 10.1. CMP with creatinine 2.61. Troponin 114->104. D-dimer 1.52. BNP 386.8. Patient given IV lasix as well as IV steroids. Creatinine now increased to 3.15.  Past Medical History:  Diagnosis Date   Anemia    Arthritis    Cataract    REMOVED BILATERAL   Colon polyps    COPD (chronic obstructive pulmonary disease) (HCC)    DIABETES MELLITUS, TYPE II    takes Metformin and Glimepiride daily   Diverticulosis    GERD (gastroesophageal reflux disease)    takes Protonix daily   History of colon polyps    HYPERLIPIDEMIA    takes Atorvastatin daily   HYPERTENSION    takes Amlodipine and Hyzaar daily   Ileus following gastrointestinal surgery (HCC) 07/22/2012   Joint pain    Peripheral neuropathy    Peripheral neuropathy    PERIPHERAL NEUROPATHY, FEET 10/06/2007   Renal disorder    VITAMIN B12 DEFICIENCY 09/06/2008   VITAMIN D DEFICIENCY 01/16/2010   takes Vitamin D daily    Past Surgical History:  Procedure Laterality Date   CATARACT EXTRACTION     both eyes   CHOLECYSTECTOMY N/A 07/18/2012   Procedure: LAPAROSCOPIC CHOLECYSTECTOMY  WITH INTRAOPERATIVE CHOLANGIOGRAM;  Surgeon: Nicholas Malady, MD;  Location: [facility] OR;  Service: General;  Laterality: N/A;   COLONOSCOPY     EYE SURGERY Right 01/2023   POLYPECTOMY     POSTERIOR CERVICAL FUSION/FORAMINOTOMY N/A 06/27/2015   Procedure: Posterior Cervical Fusion with lateral mass fixation Cervical four to cervical seven;  Surgeon: Nicholas Alert, MD;   Location: [facility];  Service: Neurosurgery;  Laterality: N/A;   TOTAL KNEE ARTHROPLASTY  2001   Left   TURP VAPORIZATION         Inpatient Medications: Scheduled Meds:  allopurinol  100 mg Oral Daily   arformoterol  15 mcg Nebulization BID   atorvastatin  40 mg Oral Daily   benzonatate  100 mg Oral TID   budesonide (PULMICORT) nebulizer solution  0.25 mg Nebulization BID   carvedilol  3.125 mg Oral BID WC   dextromethorphan  30 mg Oral BID   doxazosin  2 mg Oral Daily   finasteride  5 mg Oral Daily   guaiFENesin  1,200 mg Oral BID   insulin aspart  0-15 Units Subcutaneous TID WC   insulin aspart  0-5 Units Subcutaneous QHS   levothyroxine  50 mcg Oral Daily   methylPREDNISolone (SOLU-MEDROL) injection  40 mg Intravenous Q12H   pantoprazole  40 mg Oral BID AC   revefenacin  175 mcg Nebulization Daily   Continuous Infusions:  heparin 1,150 Units/hr (05/31/23 0634)   PRN Meds: acetaminophen **OR** acetaminophen, albuterol, HYDROcodone bit-homatropine, menthol-cetylpyridinium, ondansetron **OR** ondansetron (ZOFRAN) IV, oxymetazoline, sodium chloride  Allergies:    Allergies  Allergen Reactions   Amlodipine Swelling   Cymbalta [Duloxetine Hcl] Other (See Comments)   Oxycodone Other (See Comments)    Social History:   Social History   Socioeconomic History   Marital status: Married    Spouse name: Not on file   Number of children: 5   Years of education: Not on file   Highest education level: Not on file  Occupational History   Not on file  Tobacco Use   Smoking status: Former    Current packs/day: 0.00    Average packs/day: 1 pack/day for 31.0 years (31.0 ttl pk-yrs)    Types: Cigarettes    Start date: 12/22/1952    Quit date: 12/23/1983    Years since quitting: 39.4    Passive exposure: Never   Smokeless tobacco: Never  Vaping Use   Vaping status: Never Used  Substance and Sexual Activity   Alcohol use: No    Alcohol/week: 0.0 standard drinks of  alcohol   Drug use: No   Sexual activity: Never  Other Topics Concern   Not on file  Social History Narrative   Not on file   Social Drivers of Health   Financial Resource Strain: Low Risk  (11/16/2022)   Overall Financial Resource Strain (CARDIA)    Difficulty of Paying Living Expenses: Not very hard  Food Insecurity: No Food Insecurity (05/30/2023)   Hunger Vital Sign    Worried About Running Out of Food in the Last Year: Never true    Ran Out of Food in the Last Year: Never true  Transportation Needs: No Transportation Needs (05/30/2023)   PRAPARE - Administrator, Civil Service (Medical): No    Lack of Transportation (Non-Medical): No  Physical Activity: Inactive (11/16/2022)   Exercise Vital Sign    Days of Exercise per Week: 0 days    Minutes of Exercise per Session: 0 min  Stress: No Stress Concern Present (11/16/2022)   [employer] of Occupational Health - Occupational Stress Questionnaire    Feeling of Stress : Not at all  Social Connections: Moderately Integrated (05/30/2023)   Social Connection and Isolation Panel [NHANES]    Frequency of Communication with Friends and Family: More than three times a week    Frequency of Social Gatherings with Friends and Family: Twice a week    Attends Religious Services: More than 4 times per year    Active Member of [employer] or Organizations: Yes    Attends Banker Meetings: More than 4 times per year    Marital Status: Widowed  Intimate Partner Violence: Not At Risk (05/30/2023)   Humiliation, Afraid, Rape, and Kick questionnaire    Fear of Current or Ex-Partner: No    Emotionally Abused: No    Physically Abused: No    Sexually Abused: No    Family History:    Family History  Problem Relation Age of Onset   Diabetes Mellitus II Mother    Lung cancer Brother    Colon cancer Neg Hx    Esophageal cancer Neg Hx    Rectal cancer Neg Hx    Stomach cancer Neg Hx      ROS:  Please see the history of  present illness.   All other ROS reviewed and negative.     Physical Exam/Data:   Vitals:   05/31/23 0500 05/31/23 0600 05/31/23 0806 05/31/23 0845  BP:  (!) 143/73 131/69   Pulse:  (!) 56 (!) 52   Resp:      Temp:  99 F (37.2 C) 98.5 F (36.9 C)   TempSrc:  Oral    SpO2:  97% 97% 98%  Weight: 84.5 kg     Height:        Intake/Output Summary (Last 24 hours) at 05/31/2023 1112 Last data filed at 05/30/2023 2200 Gross per 24 hour  Intake 283.66 ml  Output --  Net 283.66 ml      05/31/2023    5:00 AM 05/30/2023    1:11 PM 05/10/2023   12:57 PM  Last 3 Weights  Weight (lbs) 186 lb 4.6 oz 196 lb 193 lb  Weight (kg) 84.5 kg 88.905 kg 87.544 kg     Body mass index is 23.92 kg/m.  General:  Well nourished, well developed, in no acute distress HEENT: normal Neck: minimally elevated JVP Vascular: No carotid bruits; Distal pulses 2+ bilaterally Cardiac:  normal S1, S2; RRR; no murmur  Lungs:  scattered rhonchi with upper lobe expiratory wheezing Abd: soft, nontender, no hepatomegaly  Ext: no edema Musculoskeletal:  No deformities, BUE and BLE strength normal and equal Skin: warm and dry  Neuro:  CNs 2-12 intact, no focal abnormalities noted Psych:  Normal affect   EKG:  The EKG was personally reviewed and demonstrates:  sinus rhythm with RBBB (old) and new TWI in leads III and aVF Telemetry:  Telemetry was personally reviewed and demonstrates:  sinus rhythm   Relevant CV Studies:  03/08/24 Stress Myoview   Findings are consistent with no ischemia. The study is low risk.   No ST deviation was noted.   LV perfusion is abnormal. Defect 1: There is a small defect with mild reduction in uptake present in the apical apex location(s) that is fixed. There is normal wall motion in the defect area. Consistent with artifact caused by diaphragmatic attenuation.   Left ventricular function is normal. Nuclear stress EF: 65%.  The left ventricular ejection fraction is normal (55-65%). End  diastolic cavity size is mildly enlarged. End systolic cavity size is normal.   Prior study available for comparison from 09/06/2014. Mild fixed apical anterior perfusion defect, no ischemia. LVEF 54%  Laboratory Data:  High Sensitivity Troponin:   Recent Labs  Lab 05/30/23 1348 05/30/23 1551  TROPONINIHS 114* 104*     Chemistry Recent Labs  Lab 05/30/23 1348 05/31/23 0859  NA 141 138  K 3.7 3.8  CL 107 106  CO2 22 19*  GLUCOSE 164* 236*  BUN 46* 55*  CREATININE 2.61* 3.15*  CALCIUM 9.4 9.3  GFRNONAA 23* 18*  ANIONGAP 12 13    Recent Labs  Lab 05/30/23 1348 05/31/23 0859  PROT 6.6 6.1*  ALBUMIN 3.7 2.8*  AST 19 21  ALT 19 22  ALKPHOS 75 66  BILITOT 1.0 1.2   Lipids No results for input(s): "CHOL", "TRIG", "HDL", "LABVLDL", "LDLCALC", "CHOLHDL" in the last 168 hours.  Hematology Recent Labs  Lab 05/30/23 1348 05/31/23 0859  WBC 4.9 3.9*  RBC 3.35* 3.55*  HGB 10.1* 10.5*  HCT 30.8* 32.5*  MCV 91.9 91.5  MCH 30.1 29.6  MCHC 32.8 32.3  RDW 15.0 14.9  PLT 214 198   Thyroid No results for input(s): "TSH", "FREET4" in the last 168 hours.  BNP Recent Labs  Lab 05/30/23 1348  BNP 386.8*    DDimer  Recent Labs  Lab 05/30/23 1348  DDIMER 1.52*     Radiology/Studies:  CT CHEST WO CONTRAST Result Date: 05/30/2023 CLINICAL DATA:  Chronic cough for several weeks EXAM: CT CHEST WITHOUT CONTRAST TECHNIQUE: Multidetector CT imaging of the chest was performed following the standard protocol without IV contrast. RADIATION DOSE REDUCTION: This exam was performed according to the departmental dose-optimization program which includes automated exposure control, adjustment of the mA and/or kV according to patient size and/or use of iterative reconstruction technique. COMPARISON:  Chest x-ray from earlier in the same day. FINDINGS: Cardiovascular: Somewhat limited due to lack of IV contrast. Atherosclerotic calcifications of the aorta are noted. No cardiac enlargement is  seen. Pulmonary artery is not significantly enlarged. Mediastinum/Nodes: Thoracic inlet is within normal limits. No hilar or mediastinal adenopathy is noted. The esophagus as visualized is within normal limits. Lungs/Pleura: The lungs are well aerated bilaterally. Diffuse emphysematous changes are noted. Mild basilar scarring is noted bilaterally. No focal confluent infiltrate or parenchymal nodule is seen. Upper Abdomen: Visualized upper abdomen shows changes of prior cholecystectomy. Musculoskeletal: No chest wall mass or suspicious bone lesions identified. IMPRESSION: Severe emphysematous changes without acute abnormality. No other focal abnormality is seen. Aortic Atherosclerosis (ICD10-I70.0) and Emphysema (ICD10-J43.9). Electronically Signed   By: Alcide Clever M.D.   On: 05/30/2023 22:37   DG Chest Port 1 View Result Date: 05/30/2023 CLINICAL DATA:  Shortness of breath with history of COPD EXAM: PORTABLE CHEST 1 VIEW COMPARISON:  05/12/2023 FINDINGS: Incompletely imaged cervical spine fixation. Midline trachea. Normal heart size. Atherosclerosis in the transverse aorta. Tortuous thoracic aorta. No pleural effusion or pneumothorax. Hyperinflation. Moderate, increased pulmonary interstitial thickening and coarsening. No lobar consolidation. Similar left base scarring. IMPRESSION: Hyperinflation with increased interstitial thickening/coarsening. This could all be the sequelae of smoking/chronic bronchitis. Cannot exclude superimposed pulmonary venous congestion. Aortic Atherosclerosis (ICD10-I70.0). Electronically Signed   By: Jeronimo Greaves M.D.   On: 05/30/2023 13:51     Assessment and Plan:   Elevated troponin Patient admitted with acute hypoxic respiratory failure found with Troponin 114->104. ECG with new  TWI in leads III, AVF. Intermittent exertional chest tightness reported by patient. Associates with dyspnea and says that albuterol provides relief.  Workup to this point and symptoms are not  consistent with ACS. This may represent demand ischemia in the setting of hypoxia with acute pulmonary symptoms. Although new TWI in leads III and aVF are concerning for myocardial ischemia, patient with low risk stress test in December 2024 and no significant coronary calcium burden seen on CT chest this admission. Additionally, would be a poor candidate for invasive cardiac evaluation given AKI on CKD V.  Will follow echocardiogram/evaluate for new cardiomyopathy and RWMA. If cardiomyopathy noted, GDMT likely to be limited by CKD V.  Continue Atorvastatin, Coreg  Acute hypoxic respiratory failure Acute on chronic COPD Possible pulmonary embolism Acute CHF Patient with longstanding pulmonary history presented with progressive dyspnea on exertion and cough.  D-dimer 1.52, V/Q scan negative for PE. BNP 386.8. Although BNP is elevated from previously values, physical exam is not consistent with significant volume overload. Suspect that this BNP reflects worsening pulmonary disease/pulmonary hypertension. With AKI on CKD following IV lasix, hold further diuresis.   AKI on CKD V Patient with recent baseline creatinine of ~2.2 admitted with creatinine of 2.61. Today, creatinine elevated to 3.15 with concurrent rise in BUN. Recommend holding further diuretics.  Hypertension Patient with mild-moderate BP elevation this admission. Home meds include Coreg 12.5mg  BID, Losartan 100mg . Agree with holding Losartan in setting of AKI.  Continue Coreg 12.5mg  BID. Could consider more selective beta blocker given significant pulmonary disease.   Per primary team: DM type II   Risk Assessment/Risk Scores:        New York Heart Association (NYHA) Functional Class NYHA Class III        For questions or updates, please contact [facility] Please consult [email or web address] for contact info under    Signed, Perlie Gold, PA-C  05/31/2023 11:12 AM  Patient seen, examined. Available data  reviewed. Agree with findings, assessment, and plan as outlined by Perlie Gold, PA-C.  Asked to evaluate patient for heart failure and elevated cardiac biomarkers.  On my exam, the patient is a very pleasant elderly male in no distress.  HEENT is normal, JVP is normal, carotid upstrokes normal with no bruits, heart sounds are regular rate and rhythm with no murmur or gallop, lungs show scattered rhonchi but otherwise clear, abdomen is soft and nontender, extremities are notable for no edema on the left and 1+ edema on the right with chronic stasis dermatitis. The patient is medically complex with COPD and advanced kidney disease.  He presents now with cough and progressive shortness of breath.  He has undergone evaluation for PE with VQ scan which was negative.  Chest x-ray shows hyperinflation and increased interstitial thickening/coarsening.  CT of the chest without contrast shows severe emphysematous changes without acute abnormality.  CT of the chest from 1 year ago reviewed and showed severe centrilobular emphysema.  Labs reviewed with high-sensitivity troponin and a flat low-level trend of 114 and 104 in the setting of severely reduced GFR less than 30.  This is not suggestive of ACS.  BNP is elevated again to be interpreted with some caution in the setting of his advanced kidney disease.  He was given IV diuretics and creatinine increased from 2.61 up to 3.15.  Recommend await 2D echo result, continue supportive care, hold on further diuresis at this point.  Will follow-up once his echocardiogram is completed.  Would not recommend any specific  cardiac interventions at this point.  Even if he has regional wall motion abnormalities or LV dysfunction, I think it is advanced age and degree of chronic kidney disease I would not recommend pursuing cardiac catheterization.  Nicholas Patton, M.D. 05/31/2023 1:40 PM

## 2023-05-31 NOTE — Progress Notes (Signed)
 PT Cancellation Note  Patient Details Name: Nicholas Patton MRN: [id number] DOB: 07-Sep-1934   Cancelled Treatment:    Reason Eval/Treat Not Completed: Other (comment) (pt with SOB and elevated D-dimer. VQ scan and LE doppler ordered and await results prior to mobility)   Aloise Copus B Candy Ziegler 05/31/2023, 7:00 AM Merryl Hacker, PT Acute Rehabilitation Services Office: [phone]

## 2023-05-31 NOTE — Progress Notes (Signed)
 Heart Failure Navigator Progress Note  Assessed for Heart & Vascular TOC clinic readiness.  Patient does not meet criteria due to - elevated BNP more likely due to worsening pulmonary disease per cardiology, EF 55-60% , No HF TOC.   Navigator will sign off at this time.   Rhae Hammock, BSN, Scientist, clinical (histocompatibility and immunogenetics) Only

## 2023-05-31 NOTE — Progress Notes (Addendum)
 OT Cancellation Note  Patient Details Name: Nicholas Patton MRN: [id number] DOB: [birth year]   Cancelled Treatment:    Reason Eval/Treat Not Completed: Medical issues which prohibited therapy (elevated D dimer, await doppler study to rule out DVT/PE prior to mobility.)  Carver Fila, OTD, OTR/L [employer] Preferred Acute Rehab [phone]   Dalphine Handing 05/31/2023, 8:41 AM

## 2023-05-31 NOTE — Plan of Care (Signed)

## 2023-05-31 NOTE — Consult Note (Signed)
 [facility] Renal Consultation Note  Requesting MD: Lucile Shutters, MD  Indication for Consultation:  CKD    Chief complaint: "cough and COPD"   HPI:  DEUNDRE THONG is a 89 y.o. male with a history of CKD stage 4, HTN, type 2 DM, COPD, and BPH who presented to the hospital with shortness of breath and cough.  He felt this was related to his COPD.  He got lasix IV once in the ER.  He shares that his breathing is better today after everything that's been done.  He sees Dr. Ronalee Belts at [facility] and recently saw him in clinic a couple of weeks ago.  His daughter and son are at bedside.  His daughter shows me a written med list with both doxazosin and flomax on it as well as losartan and furosemide 40 mg daily.  He states the flomax was discontinued on admission as this was a duplicate therapy.  He had a bladder scan with 181 mL urine retained.  States he fell once recently at home and this is rare - he was getting his clothes back on in the restroom and lost his footing; he wasn't dizzy.   Creatinine  Date/Time Value Ref Range Status  11/10/2022 09:44 AM 2.27 (H) 0.61 - 1.24 mg/dL Final  81/19/1478 29:56 AM 1.86 (H) 0.61 - 1.24 mg/dL Final  21/30/8657 84:69 AM 2.06 (H) 0.61 - 1.24 mg/dL Final  62/95/2841 32:44 AM 1.76 (H) 0.61 - 1.24 mg/dL Final  03/11/7251 66:44 AM 1.67 (H) 0.61 - 1.24 mg/dL Final   Creat  Date/Time Value Ref Range Status  10/04/2019 11:15 AM 1.68 (H) 0.70 - 1.11 mg/dL Final    Comment:    For patients >64 years of age, the reference limit for Creatinine is approximately 13% higher for people identified as African-American. .    Creatinine, Ser  Date/Time Value Ref Range Status  05/31/2023 08:59 AM 3.15 (H) 0.61 - 1.24 mg/dL Final  03/47/4259 56:38 PM 2.61 (H) 0.61 - 1.24 mg/dL Final  75/64/3329 51:88 AM 2.57 (H) 0.40 - 1.50 mg/dL Final  41/66/0630 16:01 AM 2.28 (H) 0.76 - 1.27 mg/dL Final  09/32/3557 32:20 AM 2.32 (H) 0.40 - 1.50 mg/dL Final  25/42/7062  37:62 AM 2.01 (H) 0.40 - 1.50 mg/dL Final  83/15/1761 60:73 AM 1.93 (H) 0.40 - 1.50 mg/dL Final  71/08/2692 85:46 PM 2.29 (H) 0.40 - 1.50 mg/dL Final  27/05/5007 38:18 AM 1.77 (H) 0.40 - 1.50 mg/dL Final  29/93/7169 67:89 PM 1.74 (H) 0.40 - 1.50 mg/dL Final  38/12/1749 02:58 PM 1.88 (H) 0.61 - 1.24 mg/dL Final  52/77/8242 35:36 AM 1.72 (H) 0.40 - 1.50 mg/dL Final  14/43/1540 08:67 PM 1.61 (H) 0.61 - 1.24 mg/dL Final  61/95/0932 67:12 AM 1.65 (H) 0.40 - 1.50 mg/dL Final  45/80/9983 38:25 PM 1.55 (H) 0.61 - 1.24 mg/dL Final  05/39/7673 41:93 AM 1.60 (H) 0.40 - 1.50 mg/dL Final  79/04/4095 35:32 PM 1.99 (H) 0.40 - 1.50 mg/dL Final  99/24/2683 41:96 AM 1.58 (H) 0.40 - 1.50 mg/dL Final  22/29/7989 21:19 AM 1.74 (H) 0.40 - 1.50 mg/dL Final  41/74/0814 48:18 AM 1.69 (H) 0.40 - 1.50 mg/dL Final  56/31/4970 26:37 AM 1.72 (H) 0.40 - 1.50 mg/dL Final  85/88/5027 74:12 AM 1.46 0.40 - 1.50 mg/dL Final  87/86/7672 09:47 PM 1.48 0.40 - 1.50 mg/dL Final  09/62/8366 29:47 PM 1.68 (H) 0.40 - 1.50 mg/dL Final  65/46/5035 46:56 AM 1.72 (H) 0.40 - 1.50 mg/dL Final  06/19/2015 03:33 PM 1.49 (H) 0.61 - 1.24 mg/dL Final  78/29/5621 30:86 AM 1.35 0.40 - 1.50 mg/dL Final  57/84/6962 95:28 AM 1.42 0.40 - 1.50 mg/dL Final  41/32/4401 02:72 PM 1.47 (H) 0.61 - 1.24 mg/dL Final  53/66/4403 47:42 PM 1.31 0.40 - 1.50 mg/dL Final  59/56/3875 64:33 AM 1.3 0.4 - 1.5 mg/dL Final  29/51/8841 66:06 AM 1.26 0.50 - 1.35 mg/dL Final  30/16/0109 32:35 AM 1.15 0.50 - 1.35 mg/dL Final  57/32/2025 42:70 PM 1.4 0.4 - 1.5 mg/dL Final  62/37/6283 15:17 AM 1.11 0.50 - 1.35 mg/dL Final  61/60/7371 06:26 AM 1.21 0.50 - 1.35 mg/dL Final  94/85/4627 03:50 AM 1.49 (H) 0.50 - 1.35 mg/dL Final  09/38/1829 93:71 PM 1.53 (H) 0.50 - 1.35 mg/dL Final  69/67/8938 10:17 AM 1.43 (H) 0.50 - 1.35 mg/dL Final  51/04/5850 77:82 PM 1.36 (H) 0.50 - 1.35 mg/dL Final  42/35/3614 43:15 AM 1.23 0.50 - 1.35 mg/dL Final  40/10/6759 95:09 AM 1.20 0.50 -  1.35 mg/dL Final  32/67/1245 80:99 PM 1.2 0.4 - 1.5 mg/dL Final  83/38/2505 39:76 PM 1.1 0.4 - 1.5 mg/dL Final  73/41/9379 02:40 PM 1.1 0.4 - 1.5 mg/dL Final  97/35/3299 24:26 AM 1.1 0.4 - 1.5 mg/dL Final  83/41/9622 29:79 AM 0.9 0.4 - 1.5 mg/dL Final  89/21/1941 74:08 AM 0.9 0.4 - 1.5 mg/dL Final  14/48/1856 31:49 AM 0.97 0.40 - 1.50 mg/dL Final  70/26/3785 88:50 AM 0.9 0.4 - 1.5 mg/dL Final  27/74/1287 86:76 AM 1.0 0.4 - 1.5 mg/dL Final  72/11/4707 62:83 AM 0.9 0.4 - 1.5 mg/dL Final     PMHx:   Past Medical History:  Diagnosis Date   Anemia    Arthritis    Cataract    REMOVED BILATERAL   Colon polyps    COPD (chronic obstructive pulmonary disease) (HCC)    DIABETES MELLITUS, TYPE II    takes Metformin and Glimepiride daily   Diverticulosis    GERD (gastroesophageal reflux disease)    takes Protonix daily   History of colon polyps    HYPERLIPIDEMIA    takes Atorvastatin daily   HYPERTENSION    takes Amlodipine and Hyzaar daily   Ileus following gastrointestinal surgery (HCC) 07/22/2012   Joint pain    Peripheral neuropathy    Peripheral neuropathy    PERIPHERAL NEUROPATHY, FEET 10/06/2007   Renal disorder    VITAMIN B12 DEFICIENCY 09/06/2008   VITAMIN D DEFICIENCY 01/16/2010   takes Vitamin D daily    Past Surgical History:  Procedure Laterality Date   CATARACT EXTRACTION     both eyes   CHOLECYSTECTOMY N/A 07/18/2012   Procedure: LAPAROSCOPIC CHOLECYSTECTOMY WITH INTRAOPERATIVE CHOLANGIOGRAM;  Surgeon: Liz Malady, MD;  Location: [facility] OR;  Service: General;  Laterality: N/A;   COLONOSCOPY     EYE SURGERY Right 01/2023   POLYPECTOMY     POSTERIOR CERVICAL FUSION/FORAMINOTOMY N/A 06/27/2015   Procedure: Posterior Cervical Fusion with lateral mass fixation Cervical four to cervical seven;  Surgeon: Tia Alert, MD;  Location: [facility] NEURO ORS;  Service: Neurosurgery;  Laterality: N/A;   TOTAL KNEE ARTHROPLASTY  2001   Left   TURP VAPORIZATION      Family  Hx:  Family History  Problem Relation Age of Onset   Diabetes Mellitus II Mother    Lung cancer Brother    Colon cancer Neg Hx    Esophageal cancer Neg Hx    Rectal cancer Neg Hx    Stomach  cancer Neg Hx     Social History:  reports that he quit smoking about 39 years ago. His smoking use included cigarettes. He started smoking about 70 years ago. He has a 31 pack-year smoking history. He has never been exposed to tobacco smoke. He has never used smokeless tobacco. He reports that he does not drink alcohol and does not use drugs.  Allergies:  Allergies  Allergen Reactions   Amlodipine Swelling   Cymbalta [Duloxetine Hcl] Other (See Comments)   Oxycodone Other (See Comments)    Medications: Prior to Admission medications   Medication Sig Start Date End Date Taking? Authorizing Provider  albuterol (VENTOLIN HFA) 108 (90 Base) MCG/ACT inhaler INHALE 2 PUFFS BY MOUTH EVERY 6 HOURS AS NEEDED FOR WHEEZING FOR SHORTNESS OF BREATH 08/26/22  Yes Hunsucker, Lesia Sago, MD  allopurinol (ZYLOPRIM) 100 MG tablet Take 1 tablet (100 mg total) by mouth daily. 01/09/22  Yes Rodolph Bong, MD  atorvastatin (LIPITOR) 40 MG tablet Take 1 tablet (40 mg total) by mouth daily. 01/01/23  Yes Corwin Levins, MD  BREO ELLIPTA 200-25 MCG/ACT AEPB USE 1 INHALATION BY MOUTH ONCE  DAILY AT THE SAME TIME EACH DAY 03/18/23  Yes Hunsucker, Lesia Sago, MD  carvedilol (COREG) 12.5 MG tablet TAKE 1 TABLET BY MOUTH TWICE  DAILY WITH MEALS 07/06/22  Yes Corwin Levins, MD  finasteride (PROSCAR) 5 MG tablet TAKE 1 TABLET BY MOUTH DAILY 09/27/22  Yes Bjorn Pippin, MD  glimepiride (AMARYL) 2 MG tablet TAKE 2 TABLETS BY MOUTH DAILY  BEFORE BREAKFAST 05/25/23  Yes Corwin Levins, MD  levothyroxine (SYNTHROID) 50 MCG tablet Take 1 tablet (50 mcg total) by mouth daily. 10/21/22  Yes Corwin Levins, MD  losartan (COZAAR) 100 MG tablet TAKE 1 TABLET BY MOUTH DAILY 04/05/23  Yes Corwin Levins, MD  pantoprazole (PROTONIX) 40 MG tablet Take 1  tablet (40 mg total) by mouth daily. 01/01/23  Yes Corwin Levins, MD  pioglitazone (ACTOS) 30 MG tablet Take 30 mg by mouth daily. 03/24/23  Yes [provider]  tamsulosin (FLOMAX) 0.4 MG CAPS capsule Take 1 capsule (0.4 mg total) by mouth daily. 01/01/23  Yes Corwin Levins, MD  vitamin B-12 (CYANOCOBALAMIN) 100 MCG tablet Take 100 mcg by mouth daily.   Yes [provider]  doxazosin (CARDURA) 2 MG tablet Take 1 tablet (2 mg total) by mouth daily. Patient not taking: Reported on 05/30/2023 10/04/20   Corwin Levins, MD  furosemide (LASIX) 40 MG tablet Take 1 tablet (40 mg total) by mouth daily. 02/22/23 05/23/23  Maisie Fus, MD   I have reviewed the patient's current and reported prior to admission medications.  Labs:     Latest Ref Rng & Units 05/31/2023    8:59 AM 05/30/2023    1:48 PM 04/23/2023   10:47 AM  BMP  Glucose 70 - 99 mg/dL 213  086  578   BUN 8 - 23 mg/dL 55  46  54   Creatinine 0.61 - 1.24 mg/dL 4.69  6.29  5.28   Sodium 135 - 145 mmol/L 138  141  143   Potassium 3.5 - 5.1 mmol/L 3.8  3.7  4.2   Chloride 98 - 111 mmol/L 106  107  107   CO2 22 - 32 mmol/L 19  22  26    Calcium 8.9 - 10.3 mg/dL 9.3  9.4  9.9     Urinalysis    Component Value Date/Time  COLORURINE YELLOW 04/23/2023 1047   APPEARANCEUR CLEAR 04/23/2023 1047   LABSPEC 1.015 04/23/2023 1047   PHURINE 6.0 04/23/2023 1047   GLUCOSEU NEGATIVE 04/23/2023 1047   HGBUR NEGATIVE 04/23/2023 1047   BILIRUBINUR NEGATIVE 04/23/2023 1047   BILIRUBINUR negative 06/19/2014 1637   KETONESUR NEGATIVE 04/23/2023 1047   PROTEINUR 15 06/19/2014 1637   PROTEINUR NEGATIVE 12/20/2012 1030   UROBILINOGEN 0.2 04/23/2023 1047   NITRITE NEGATIVE 04/23/2023 1047   LEUKOCYTESUR NEGATIVE 04/23/2023 1047     ROS:  Pertinent items noted in HPI and remainder of comprehensive ROS otherwise negative.  Physical Exam: Vitals:   05/31/23 1331 05/31/23 1632  BP: 134/68 (!) 120/54  Pulse: 75 64  Resp: 18 20   Temp: (!) 97.4 F (36.3 C) 98.1 F (36.7 C)  SpO2: 97% 98%     General: elderly male in bed in NAD HEENT: NCAT Eyes: EOMI sclera anicteric Neck: supple trachea midline  Heart: S1S2 no rub Lungs: clear to auscultation; occasional cough  Abdomen: soft/nt/nd Extremities: no pitting edema ; no cyanosis or clubbing Skin: no rash on extremities exposed Psych normal mood and affect Neuro: alert and conversant; provides hx and follows commands  Assessment/Plan:  CKD stage IV - Appears creatinine increased after lasix administration to optimize his pulmonary status  - He sees Dr. Ronalee Belts at [facility]   HTN  - acceptable control   BPH  - noted dual alpha blocker therapies (cardura and flomax).  Flomax has since been discontinued  - now on cardura and finasteride and can continue same - some mild retention that didn't meet criteria for in/out catheterization.  Repeat post-void residual bladder scan and in/out cath if over 300 mL retained   Anemia of CKD - Mild and no indication for ESA  Disposition - would reassess labs tomorrow and if creatinine is improving can follow-up outpatient with nephrology.    Estanislado Emms 05/31/2023, 7:18 PM

## 2023-05-31 NOTE — Progress Notes (Signed)
 BLE venous duplex has been completed.   Results can be found under chart review under CV PROC. 05/31/2023 4:01 PM Avonna Iribe RVT, RDMS

## 2023-05-31 NOTE — Progress Notes (Signed)
 Progress Note   Patient: Nicholas Patton VQQ:[id number] DOB: Sep 06, 1934 DOA: 05/30/2023     1 DOS: the patient was seen and examined on 05/31/2023   Brief hospital course:  Patient with PMH of LBBB, HTN, type II DM, CKD 3B, COPD, MGUS, HTN, neuropathy, recurrent nosebleed requiring cauterization x 4 in 2025, presented to the hospital with complaints of cough and shortness of breath. Patient reports that he has progressively worsening cough and dyspnea on exertion for the last few months.  Sees Dr. Judeth Horn and pulmonary.  Symptoms specifically appears to be ongoing since July 2024.  He was referred to cardiology and seeing them since December 2024.  VQ scan performed on 05/12/2023 which was negative for any probability for PE.  But did note some ILD changes. Since that visit patient reports that he has been having progressively worsening shortness of breath. He was initially able to walk around 10 feet but now he is not able to walk even 2 feet without getting short of breath. Sitting up in the chair is also tiring. He denies any orthopnea. He has chronic leg swelling which is unchanged.  Right leg is swollen more than the left. No leg pain reported. He denies any chest pain right now pleuritic chest pain before coming to the hospital. But reports that he has some chest tightness on exertion for last couple of days. For last couple of days he also reports decrease in appetite. His primary complaint is cough which gets worse when he is lays on his back. Cough is mostly dry.  Also occurs at nighttime.  No blood with the sputum. Denies any fever or chills.  Denies any recent sick contact or recent upper airway infection. He does have a history of epistaxis.  He reports that he had cauterization done 4 times during this year only with ENT. He does see Dr. Ronalee Belts for CKD.  He has been on Lasix.  Dosages were adjusted intermittently to address the swelling in the leg.   With this he presented to the  ED with complaints of cough and shortness of breath.  There were some concerns with regards to possibility of a PE again and therefore the patient was started on heparin.  Brought to the hospital for admission for further workup including a VQ scan.    Assessment and Plan:  Acute hypoxic respiratory failure COPD with severe emphysema Pulmonary hypertension Acute PE ruled out Patient presents to the ER for evaluation of worsening shortness of breath from his baseline. Imaging showed severe emphysema He was hypoxic with room air pulse oximetry of 88% upon arrival to the ER requiring oxygen supplementation at 2 L with improvement to greater than 92% Continue systemic steroids Continue nebulizer treatment Patient had a VQ scan which showed no evidence of pulmonary embolism.  Will discontinue heparin drip. Patient was assessed for home oxygen need, room air pulse oximetry at rest was 83% and with ambulation pulse oximetry dropped to 88% and improved with oxygen supplementation at 2 L.    Diabetes mellitus with complications of hyperglycemia and stage IV chronic kidney disease Continue consistent carbohydrate diet Sliding scale insulin Hold oral hypoglycemic agents    AKI superimposed on stage IV chronic kidney disease Anemia of chronic kidney disease Most likely secondary to diuretics the patient received Creatinine bumped from 2.6-3.5 Hold diuretics Rule out obstructive uropathy. Consult Nephrology Avoid nephrotoxic agents. Hemoglobin is stable    GERD Continue PPI    Hypertension Continue carvedilol with holding parameters  BPH Continue Proscar      Subjective: Appears comfortable and in no obvious distress.  Physical Exam: Vitals:   05/31/23 0600 05/31/23 0806 05/31/23 0845 05/31/23 1331  BP: (!) 143/73 131/69  134/68  Pulse: (!) 56 (!) 52  75  Resp:    18  Temp: 99 F (37.2 C) 98.5 F (36.9 C)  (!) 97.4 F (36.3 C)  TempSrc: Oral   Oral  SpO2: 97% 97%  98% 97%  Weight:      Height:       Vitals and nursing note reviewed.  Constitutional:      General: He is not in acute distress. HENT:     Head: Normocephalic and atraumatic.  Eyes:     Conjunctiva/sclera: Conjunctivae normal.     Pupils: Pupils are equal, round, and reactive to light.  Cardiovascular:     Rate and Rhythm: Normal rate and regular rhythm.  Pulmonary:     Effort: Pulmonary effort is normal. No respiratory distress.     Comments: Expiratory wheezing present on coughing otherwise lungs were clear to auscultation bilaterally Abdominal:     General: There is no distension.     Tenderness: There is no guarding.  Musculoskeletal:        General: No deformity or signs of injury.     Cervical back: Neck supple.  Skin:    Findings: No lesion or rash.  Neurological:     General: No focal deficit present.     Mental Status: He is alert. Mental status is at baseline.     Data Reviewed: Creatinine 3.15, BUN 55, hemoglobin 10.5 improved renal insults with diuresis Labs reviewed   Family Communication: Plan of care was discussed with patient's daughter at the bedside.  All questions and concerns have been addressed.  She verbalizes understanding and agrees to the plan.  Disposition: Status is: Inpatient Remains inpatient appropriate because: Management of AKI  Planned Discharge Destination: Home    Time spent: 38 minutes  Author: Lucile Shutters, MD 05/31/2023 3:36 PM  For on call review [email or web address].

## 2023-05-31 NOTE — Inpatient Diabetes Management (Signed)
 Inpatient Diabetes Program Recommendations  AACE/ADA: New Consensus Statement on Inpatient Glycemic Control (2015)  Target Ranges:  Prepandial:   less than 140 mg/dL      Peak postprandial:   less than 180 mg/dL (1-2 hours)      Critically ill patients:  140 - 180 mg/dL   Lab Results  Component Value Date   GLUCAP 235 (H) 05/31/2023   HGBA1C 7.4 (H) 04/23/2023    Review of Glycemic Control  Latest Reference Range & Units 05/30/23 17:21 05/30/23 22:30 05/31/23 08:07  Glucose-Capillary 70 - 99 mg/dL 578 (H) 469 (H) 629 (H)   Diabetes history: DM 2 Outpatient Diabetes medications: Amaryl 2 mg Daily, Actos 30 mg Daily Current orders for Inpatient glycemic control:  Novolog 0-15 units tid + hs  A1c 7.4% on 2/14 Solumedrol 40 mg Q12 hours Elevated renal function  Note: glucose trends increase after PO intake and steroid dose.  Inpatient Diabetes Program Recommendations:    -   Consider adding Novolog 2 units tid meal coverage if eating >50% of meals  Thanks,  Christena Deem RN, MSN, BC-ADM Inpatient Diabetes Coordinator Team Pager [phone] (8a-5p)

## 2023-05-31 NOTE — Progress Notes (Signed)
 PHARMACY - ANTICOAGULATION CONSULT NOTE  Pharmacy Consult for Heparin  Indication: rule out pulmonary embolus  Allergies  Allergen Reactions   Amlodipine Swelling   Cymbalta [Duloxetine Hcl] Other (See Comments)   Oxycodone Other (See Comments)    Patient Measurements: Height: 6\' 2"  (188 cm) Weight: 88.9 kg (196 lb) IBW/kg (Calculated) : 82.2 Heparin Dosing Weight: 88.9 kg   Vital Signs: Temp: 98.7 F (37.1 C) (03/23 2105) Temp Source: Oral (03/23 2105) BP: 135/51 (03/23 2145) Pulse Rate: 83 (03/23 2145)  Labs: Recent Labs    05/30/23 1348 05/30/23 1551 05/31/23 0043  HGB 10.1*  --   --   HCT 30.8*  --   --   PLT 214  --   --   HEPARINUNFRC  --   --  0.94*  CREATININE 2.61*  --   --   TROPONINIHS 114* 104*  --     Estimated Creatinine Clearance: 23.6 mL/min (A) (by C-G formula based on SCr of 2.61 mg/dL (H)).   Medical History: Past Medical History:  Diagnosis Date   Anemia    Arthritis    Cataract    REMOVED BILATERAL   Colon polyps    COPD (chronic obstructive pulmonary disease) (HCC)    DIABETES MELLITUS, TYPE II    takes Metformin and Glimepiride daily   Diverticulosis    GERD (gastroesophageal reflux disease)    takes Protonix daily   History of colon polyps    HYPERLIPIDEMIA    takes Atorvastatin daily   HYPERTENSION    takes Amlodipine and Hyzaar daily   Ileus following gastrointestinal surgery (HCC) 07/22/2012   Joint pain    Peripheral neuropathy    Peripheral neuropathy    PERIPHERAL NEUROPATHY, FEET 10/06/2007   Renal disorder    VITAMIN B12 DEFICIENCY 09/06/2008   VITAMIN D DEFICIENCY 01/16/2010   takes Vitamin D daily     Assessment: Patient presenting with SOB, hx of COPD. Not on anticoagulation PTA, HgB 10.1 and PLTs 214. Concern for potential PE, V/Q scan ordered. Pharmacy consulted to dose empiric heparin for potential PE. Note hx of recurrent nosebleed that has required cauterization in the past month.   MD requested lower  goal due to epitaxis.  3/24 AM update:  Heparin level supra-therapeutic   Goal of Therapy:  Heparin Level: 0.3-0.5 units/mL Monitor platelets by anticoagulation protocol: Yes   Plan:  Dec heparin to 1150 units/hr Heparin level in 8 hours F/U VQ scan  Abran Duke, PharmD, BCPS Clinical Pharmacist Phone: [phone]

## 2023-06-01 DIAGNOSIS — R7989 Other specified abnormal findings of blood chemistry: Secondary | ICD-10-CM | POA: Diagnosis not present

## 2023-06-01 DIAGNOSIS — I2489 Other forms of acute ischemic heart disease: Secondary | ICD-10-CM | POA: Diagnosis present

## 2023-06-01 DIAGNOSIS — I10 Essential (primary) hypertension: Secondary | ICD-10-CM | POA: Diagnosis not present

## 2023-06-01 DIAGNOSIS — J9601 Acute respiratory failure with hypoxia: Secondary | ICD-10-CM | POA: Diagnosis not present

## 2023-06-01 DIAGNOSIS — N179 Acute kidney failure, unspecified: Secondary | ICD-10-CM | POA: Diagnosis not present

## 2023-06-01 DIAGNOSIS — N185 Chronic kidney disease, stage 5: Secondary | ICD-10-CM | POA: Diagnosis not present

## 2023-06-01 LAB — BASIC METABOLIC PANEL
Anion gap: 8 (ref 5–15)
BUN: 65 mg/dL — ABNORMAL HIGH (ref 8–23)
CO2: 21 mmol/L — ABNORMAL LOW (ref 22–32)
Calcium: 8.6 mg/dL — ABNORMAL LOW (ref 8.9–10.3)
Chloride: 108 mmol/L (ref 98–111)
Creatinine, Ser: 2.79 mg/dL — ABNORMAL HIGH (ref 0.61–1.24)
GFR, Estimated: 21 mL/min — ABNORMAL LOW (ref >60)
Glucose, Bld: 193 mg/dL — ABNORMAL HIGH (ref 70–99)
Potassium: 3.9 mmol/L (ref 3.5–5.1)
Sodium: 137 mmol/L (ref 135–145)

## 2023-06-01 LAB — CBC
HCT: 28.5 % — ABNORMAL LOW (ref 39.0–52.0)
Hemoglobin: 9.2 g/dL — ABNORMAL LOW (ref 13.0–17.0)
MCH: 29.2 pg (ref 26.0–34.0)
MCHC: 32.3 g/dL (ref 30.0–36.0)
MCV: 90.5 fL (ref 80.0–100.0)
Platelets: 202 10*3/uL (ref 150–400)
RBC: 3.15 MIL/uL — ABNORMAL LOW (ref 4.22–5.81)
RDW: 15 % (ref 11.5–15.5)
WBC: 5.2 10*3/uL (ref 4.0–10.5)
nRBC: 0 % (ref 0.0–0.2)

## 2023-06-01 LAB — GLUCOSE, CAPILLARY
Glucose-Capillary: 158 mg/dL — ABNORMAL HIGH (ref 70–99)
Glucose-Capillary: 214 mg/dL — ABNORMAL HIGH (ref 70–99)
Glucose-Capillary: 186 mg/dL — ABNORMAL HIGH (ref 70–99)

## 2023-06-01 NOTE — Evaluation (Signed)
 Physical Therapy Evaluation Patient Details Name: Nicholas Patton MRN: [id number] DOB: 06-25-1934 Today's Date: 06/01/2023  History of Present Illness  89 yo male admitted 3/23 with SOB. PMHx: Asthma, COPD, DM, HTN, insomnia, left ear hearing loss, vertigo, overactive bladder, cervical fusion, gout, CKD  Clinical Impression  Patient received sitting edge of bed, daughter present in room. Patient states he does not use AD at baseline. Has cane, daughter said he does not use. Patient is supervision for sit to stand, cga for ambulation in hallway 125 feet no AD. Mild to moderate sob, O2 sats 92% upon returning to room. Patient reports he is breathing better and hasn't walked that far in a while. He will continue to benefit from skilled PT to improve strength and endurance.          If plan is discharge home, recommend the following: A little help with walking and/or transfers;A little help with bathing/dressing/bathroom;Assistance with cooking/housework;Help with stairs or ramp for entrance;Assist for transportation   Can travel by private vehicle    yes    Equipment Recommendations None recommended by PT  Recommendations for Other Services       Functional Status Assessment Patient has had a recent decline in their functional status and demonstrates the ability to make significant improvements in function in a reasonable and predictable amount of time.     Precautions / Restrictions Precautions Precautions: Fall Recall of Precautions/Restrictions: Intact Restrictions Weight Bearing Restrictions Per Provider Order: No      Mobility  Bed Mobility               General bed mobility comments: patient received sitting EOB. Daughter in room.    Transfers Overall transfer level: Needs assistance Equipment used: None Transfers: Sit to/from Stand Sit to Stand: Supervision                Ambulation/Gait Ambulation/Gait assistance: Supervision, Contact guard assist Gait  Distance (Feet): 125 Feet Assistive device: None Gait Pattern/deviations: Step-through pattern, Decreased step length - right, Decreased step length - left, Decreased stride length Gait velocity: decr     General Gait Details: patient ambulated well in hallway. Mild SOB. O2 sats at 92%, HR 92. Reports he is feeling better.  Stairs            Wheelchair Mobility     Tilt Bed    Modified Rankin (Stroke Patients Only)       Balance Overall balance assessment: Mild deficits observed, not formally tested                                           Pertinent Vitals/Pain Pain Assessment Pain Assessment: No/denies pain    Home Living Family/patient expects to be discharged to:: Private residence Living Arrangements: Children Available Help at Discharge: Family;Available PRN/intermittently Type of Home: House Home Access: Ramped entrance       Home Layout: One level Home Equipment: None      Prior Function Prior Level of Function : Driving;Independent/Modified Independent             Mobility Comments: patient drives and does not use AD at baseline ADLs Comments: Shops, etc for himself.     Extremity/Trunk Assessment   Upper Extremity Assessment Upper Extremity Assessment: Defer to OT evaluation    Lower Extremity Assessment Lower Extremity Assessment: Generalized weakness    Cervical / Trunk Assessment  Cervical / Trunk Assessment: Normal  Communication   Communication Communication: No apparent difficulties    Cognition Arousal: Alert Behavior During Therapy: WFL for tasks assessed/performed   PT - Cognitive impairments: No apparent impairments                         Following commands: Intact       Cueing Cueing Techniques: Verbal cues     General Comments      Exercises     Assessment/Plan    PT Assessment Patient needs continued PT services  PT Problem List Decreased strength;Cardiopulmonary status  limiting activity;Decreased activity tolerance;Decreased mobility;Decreased balance       PT Treatment Interventions Gait training;Stair training;Functional mobility training;Therapeutic activities;Therapeutic exercise;Patient/family education;Balance training;DME instruction    PT Goals (Current goals can be found in the Care Plan section)  Acute Rehab PT Goals Patient Stated Goal: to return home PT Goal Formulation: With patient/family Time For Goal Achievement: 06/07/23 Potential to Achieve Goals: Good    Frequency Min 2X/week     Co-evaluation               AM-PAC PT "6 Clicks" Mobility  Outcome Measure Help needed turning from your back to your side while in a flat bed without using bedrails?: None Help needed moving from lying on your back to sitting on the side of a flat bed without using bedrails?: None Help needed moving to and from a bed to a chair (including a wheelchair)?: A Little Help needed standing up from a chair using your arms (e.g., wheelchair or bedside chair)?: A Little Help needed to walk in hospital room?: A Little Help needed climbing 3-5 steps with a railing? : A Lot 6 Click Score: 19    End of Session   Activity Tolerance: Patient tolerated treatment well Patient left: in bed;Other (comment);with call bell/phone within reach;with family/visitor present (left seated on side of bed) Nurse Communication: Mobility status PT Visit Diagnosis: Other abnormalities of gait and mobility (R26.89);Muscle weakness (generalized) (M62.81);Difficulty in walking, not elsewhere classified (R26.2)    Time: 6644-0347 PT Time Calculation (min) (ACUTE ONLY): 11 min   Charges:   PT Evaluation $PT Eval Low Complexity: 1 Low   PT General Charges $$ ACUTE PT VISIT: 1 Visit         Delante Karapetyan, PT, GCS 06/01/23,11:13 AM

## 2023-06-01 NOTE — Plan of Care (Signed)
 Patient AAOx4. Incentive spirometer instruction given, tolerated well. No complaints of pain. SBA. RPIV. Safety precautions maintained.   Discharge instructions discussed with patient and daughter, verbalized understanding. RPIV removed. Portable O2 delivered. RW switched to home delivery. Patient left in no acute or respiratory distress.   Problem: Health Behavior/Discharge Planning: Goal: Ability to manage health-related needs will improve Outcome: Progressing   Problem: Clinical Measurements: Goal: Respiratory complications will improve Outcome: Progressing   Problem: Nutrition: Goal: Adequate nutrition will be maintained Outcome: Progressing

## 2023-06-01 NOTE — TOC Initial Note (Addendum)
 Transition of Care [facility]) - Initial/Assessment Note    Patient Details  Name: Nicholas Patton MRN: [id number] Date of Birth: 13-Jun-1934  Transition of Care [facility]) CM/SW Contact:    Janae Bridgeman, RN Phone Number: 06/01/2023, 12:14 PM  Clinical Narrative:                 CM met with the patient at the bedside to discuss TOC needs.  The patient admitted for pulmonary edema and plans to return home when stable.  DME at home includes a [employer] that he currently does not use.  Patient is pending walk test for home oxygen needs.  OT eval is pending at this time.  Patient was offered Medicare choice regarding home health services and patient did not have a preference. [facility] was called and patient was set up for home health services for PT, RN, OT.  HH orders placed to be co-signed by the MD.  CM will continue to follow the patient for likely need for home oxygen.  05/31/23 1226 - Walk test was completed by the RN and patient was offered Medicare choice regarding DME company and patient did not have a preference.  I called [employer] and portable oxygen will be delivered to the bedside.  06/01/23-  RW was not delivered to the hospital room.  Family requested RW to be delivered to the home.  I called and left a detailed message with [employer].  Expected Discharge Plan: Home w Home Health Services Barriers to Discharge: Continued Medical Work up   Patient Goals and CMS Choice Patient states their goals for this hospitalization and ongoing recovery are:: To return home CMS Medicare.gov Compare Post Acute Care list provided to:: Patient Choice offered to / list presented to : Patient      Expected Discharge Plan and Services   Discharge Planning Services: CM Consult Post Acute Care Choice: Home Health Living arrangements for the past 2 months: Single Family Home Expected Discharge Date: 06/01/23                         HH Arranged: RN, PT, OT HH Agency: [facility]  Care Date [facility] Agency Contacted: 06/01/23 Time HH Agency Contacted: 1213 Representative spoke with at [facility] Agency: Kandee Keen, RNCM with [facility]  Prior Living Arrangements/Services Living arrangements for the past 2 months: Single Family Home Lives with:: Self Patient language and need for interpreter reviewed:: Yes Do you feel safe going back to the place where you live?: Yes      Need for Family Participation in Patient Care: Yes (Comment) Care giver support system in place?: Yes (comment) Current home services: DME (has Cane - does not use currently) Criminal Activity/Legal Involvement Pertinent to Current Situation/Hospitalization: No - Comment as needed  Activities of Daily Living   ADL Screening (condition at time of admission) Independently performs ADLs?: Yes (appropriate for developmental age) Is the patient deaf or have difficulty hearing?: No Does the patient have difficulty seeing, even when wearing glasses/contacts?: No Does the patient have difficulty concentrating, remembering, or making decisions?: No  Permission Sought/Granted Permission sought to share information with : Case Manager, Magazine features editor Permission granted to share information with : Yes, Verbal Permission Granted     Permission granted to share info w AGENCY: Home health agency  Permission granted to share info w Relationship: daughter, Shedric Fredericks - [phone]     Emotional Assessment Appearance:: Appears stated age Attitude/Demeanor/Rapport: Gracious Affect (typically  observed): Accepting Orientation: : Oriented to Self, Oriented to Place, Oriented to  Time, Oriented to Situation Alcohol / Substance Use: Illicit Drugs (Quit smoking years ago) Psych Involvement: No (comment)  Admission diagnosis:  Positive D dimer [R79.89] Elevated troponin [R79.89] Acute respiratory failure with hypoxia (HCC) [J96.01] Acute hypoxic respiratory failure (HCC) [J96.01] Patient Active Problem  List   Diagnosis Date Noted   Demand ischemia of myocardium (HCC) 06/01/2023   Elevated troponin 05/31/2023   Acute hypoxic respiratory failure (HCC) 05/30/2023   Hypothyroid 10/21/2022   Nosebleed 08/09/2022   Low platelet count (HCC) 08/09/2022   COPD with emphysema (HCC) 04/22/2022   Acute asthmatic bronchitis 12/02/2021   Bilateral ankle pain 04/21/2021   Acute cough 01/19/2021   Pain and swelling of lower leg 11/24/2020   Scrotal itching 04/06/2020   Aortic atherosclerosis (HCC) 04/05/2020   Gross hematuria 04/05/2020   Pruritus 02/04/2020   Right ankle pain 02/04/2020   Rash 10/08/2019   Asthma 05/19/2019   Abnormal TSH 10/04/2018   Headache 10/04/2018   PHN (postherpetic neuralgia) 04/04/2018   Shingles 03/11/2018   Acute gout due to renal impairment involving toe of left foot 02/02/2018   Eustachian tube dysfunction, left 12/06/2017   MGUS (monoclonal gammopathy of unknown significance) 10/28/2017   Elevated blood protein 10/08/2017   Peripheral edema 09/17/2017   Anemia, iron deficiency 04/10/2016   Left-sided nosebleed 01/13/2016   Hematochezia 11/21/2015   Peripheral neuropathic pain 10/25/2015   Low back pain 10/08/2015   Chronic kidney disease, stage 3b (HCC) 10/08/2015   S/P cervical spinal fusion 06/27/2015   Increased prostate specific antigen (PSA) velocity 04/09/2015   Chest pain 08/14/2014   Dyspnea 08/14/2014   Urinary frequency 06/19/2014   Overactive bladder 09/27/2013   Onychomycosis 08/08/2013   Pain in lower limb 05/15/2013   Diabetic peripheral neuropathy (HCC) 05/15/2013   Other hammer toe (acquired) 05/15/2013   HAV (hallux abducto valgus) 05/15/2013   Blood in mouth of unknown source 02/09/2013   Vertigo 12/29/2012   Acute upper respiratory infection 12/29/2012   Erectile dysfunction 09/22/2012   Left ear hearing loss 09/13/2012   S/P laparoscopic cholecystectomy 08/03/2012   Neck pain on left side 04/04/2012   Trapezoid ligament  sprain 04/04/2012   Bladder neck obstruction 10/06/2011   Cervical radiculitis 09/26/2010   Vitamin D deficiency 01/16/2010   PERS HX TOBACCO USE PRESENTING HAZARDS HEALTH 09/17/2009   B12 deficiency 09/06/2008   INSOMNIA 06/21/2008   LEG PAIN, BILATERAL 10/06/2007   Diabetes (HCC) 01/24/2007   Hyperlipidemia 01/24/2007   Allergic rhinitis 01/24/2007   BPH (benign prostatic hypertrophy) 01/24/2007   Essential hypertension 10/05/2006   GERD 10/05/2006   PCP:  Corwin Levins, MD Pharmacy:   [facility]) [place], [place] - [address] [facility] [address] [place] [address] Phone: [phone] Fax: [phone]  [facility] [address] [place], [place] - [address] [place] [address] Phone: [phone] Fax: [phone]  [facility] - [place], [place] - [address] [place] [address] Phone: [phone] Fax: [phone]     Social Drivers of Health (SDOH) Social History: SDOH Screenings   Food Insecurity: No Food Insecurity (05/30/2023)  Housing: Low Risk  (05/30/2023)  Transportation Needs: No Transportation Needs (05/30/2023)  Utilities: Not At Risk (05/30/2023)  Alcohol Screen: Low Risk  (11/16/2022)  Depression (PHQ2-9): Low Risk  (04/23/2023)  Financial Resource Strain: Low Risk  (11/16/2022)  Physical Activity: Inactive (11/16/2022)  Social Connections: Moderately Integrated (05/30/2023)  Stress: No Stress Concern Present (11/16/2022)  Tobacco Use: Medium Risk (05/30/2023)  Health Literacy: Adequate Health Literacy (11/16/2022)   SDOH Interventions:     Readmission Risk Interventions    06/01/2023   12:14 PM  Readmission Risk Prevention Plan  Transportation Screening Complete  PCP or Specialist Appt within 5-7 Days Complete  Home Care Screening Complete  Medication Review (RN CM) Complete

## 2023-06-01 NOTE — Progress Notes (Signed)
   Patient Name: Nicholas Patton Date of Encounter: 06/01/2023 [facility] Cardiologist: Maisie Fus, MD   Interval Summary  .    Feeling better today.  No shortness of breath at rest.  No chest pain or tightness.  States he walked twice today and is feeling stronger with ambulation.  Vital Signs .    Vitals:   06/01/23 0500 06/01/23 0802 06/01/23 0902 06/01/23 0903  BP:  (!) 140/61    Pulse:  67    Resp:  16    Temp:  97.6 F (36.4 C)    TempSrc:  Oral    SpO2:  94% 99% 99%  Weight: 85.7 kg     Height:        Intake/Output Summary (Last 24 hours) at 06/01/2023 1047 Last data filed at 05/31/2023 1830 Gross per 24 hour  Intake --  Output 200 ml  Net -200 ml      06/01/2023    5:00 AM 05/31/2023    5:00 AM 05/30/2023    1:11 PM  Last 3 Weights  Weight (lbs) 188 lb 15 oz 186 lb 4.6 oz 196 lb  Weight (kg) 85.7 kg 84.5 kg 88.905 kg      Telemetry/ECG    Sinus rhythm without significant arrhythmia- Personally Reviewed  Physical Exam .   GEN: No acute distress.   Neck: No JVD Cardiac: RRR, no murmurs, rubs, or gallops.  Respiratory: Some expiratory wheezing bilaterally GI: Soft, nontender, non-distended  MS: Minimal right leg edema  Assessment & Plan .     1.  Elevated troponin, flat trend suspect demand ischemia echo reviewed with normal LVEF.  Defer ischemic workup in patient with advanced kidney disease.  No angina. 2.  Acute on chronic hypoxic respiratory failure: Patient with known COPD.  Echo with normal LVEF 55 to 60%, mild RV dysfunction and mild RV enlargement, no evidence of pulmonary hypertension, and no significant valvular disease.  Would continue supportive care, probably avoid loop diuretic therapy with his advanced kidney disease. 3.  AKI on background of stage IV chronic kidney disease.  Creatinine back to baseline.  Plans noted for outpatient nephrology follow-up.  Reviewed nephrology consultation.  Disposition: Appears clinically stable  from a cardiac perspective.  Will sign off today.  No specific medication changes recommended.  Will arrange outpatient cardiology follow-up.  Please call if any questions.  Patient's daughter is present at the bedside for our discussion.  For questions or updates, please contact  [facility] Please consult [email or web address] for contact info under        Signed, Tonny Bollman, MD

## 2023-06-01 NOTE — Plan of Care (Signed)

## 2023-06-01 NOTE — Progress Notes (Signed)
    Durable Medical Equipment  (From admission, onward)           Start     Ordered   06/01/23 1222  For home use only DME oxygen  Once       Question Answer Comment  Length of Need 6 Months   Mode or (Route) Nasal cannula   Liters per Minute 2   Frequency Continuous (stationary and portable oxygen unit needed)   Oxygen conserving device Yes   Oxygen delivery system Gas      06/01/23 1222             Corrente, Stephanie Acre, RN  Registered Nurse   Progress Notes    Signed   Date of Service: 06/01/2023 11:48 AM   Signed      SATURATION QUALIFICATIONS: (This note is used to comply with regulatory documentation for home oxygen)   Patient Saturations on Room Air at Rest = 97%   Patient Saturations on Room Air while Ambulating = 88%   Patient Saturations on 2 Liters of oxygen while Ambulating = 92%   Please briefly explain why patient needs home oxygen: Desaturates while ambulating on RA

## 2023-06-01 NOTE — Discharge Summary (Addendum)
 Physician Discharge Summary   Patient: RUMI TARAS MRN: [id number] DOB: 03/27/1934  Admit date:     05/30/2023  Discharge date: 06/01/23  Discharge Physician: Kiante Petrovich   PCP: Corwin Levins, MD   Recommendations at discharge:   Follow up with nephrology and pulmonary as an outpatient  Discharge Diagnoses: Principal Problem:   Acute hypoxic respiratory failure (HCC) Active Problems:   COPD with emphysema (HCC)   Hyperlipidemia   GERD   BPH (benign prostatic hypertrophy)   Diabetic peripheral neuropathy (HCC)   Chronic kidney disease, stage 3b (HCC)   MGUS (monoclonal gammopathy of unknown significance)   Asthma   Nosebleed   Hypothyroid   Elevated troponin   Demand ischemia of myocardium (HCC)  Resolved Problems:   * No resolved hospital problems. [facility] Course:  Chief Complaint: Cough progressively worsening for last few months shortness of breath and fatigue.  Decreased appetite for the last couple of days.  Chest tightness on exertion.   HPI: Patient with PMH of LBBB, HTN, type II DM, CKD 3B, COPD, MGUS, HTN, neuropathy, recurrent nosebleed requiring cauterization x 4 in 2025, presented to the hospital with complaints of cough and shortness of breath. Patient reports that he has progressively worsening cough and dyspnea on exertion for the last few months.  Sees Dr. Judeth Horn and pulmonary.  Symptoms specifically appears to be ongoing since July 2024.  He was referred to cardiology and seeing them since December 2024.  VQ scan performed on 05/12/2023 which was negative for any probability for PE.  But did note some ILD changes. Since that visit patient reports that he has been having progressively worsening shortness of breath. He was initially able to walk around 10 feet but now he is not able to walk even 2 feet without getting short of breath. Sitting up in the chair is also tiring. He denies any orthopnea. He has chronic leg swelling which is unchanged.   Right leg is swollen more than the left. No leg pain reported. He denies any chest pain right now pleuritic chest pain before coming to the hospital. But reports that he has some chest tightness on exertion for last couple of days. For last couple of days he also reports decrease in appetite. His primary complaint is cough which gets worse when he is lays on his back. Cough is mostly dry.  Also occurs at nighttime.  No blood with the sputum. Denies any fever or chills.  Denies any recent sick contact or recent upper airway infection. He does have a history of epistaxis.  He reports that he had cauterization done 4 times during this year only with ENT. He does see Dr. Ronalee Belts for CKD.  He has been on Lasix.  Dosages were adjusted intermittently to address the swelling in the leg.   With this he presented to the ED with complaints of cough and shortness of breath.  There were some concerns with regards to possibility of a PE again and therefore the patient was started on heparin.  Brought to the hospital for admission for further workup including a VQ scan.    Assessment and Plan:  Acute hypoxic respiratory failure COPD with severe emphysema Acute PE ruled out Patient presented to the ER for evaluation of worsening shortness of breath from his baseline. Imaging showed severe emphysema He was hypoxic with room air pulse oximetry of 88% upon arrival to the ER requiring oxygen supplementation at 2 L with improvement to greater than 92%  Patient had a VQ scan which showed no evidence of pulmonary embolism.   Patient was assessed for home oxygen need, room air pulse oximetry at rest was 97% and with ambulation pulse oximetry dropped to 88% and improved with oxygen supplementation at 2 L 92% Patient will be discharged home on 2 L of oxygen   Elevated troponin Most likely secondary to demand ischemia from hypoxemic respiratory failure Troponin levels were flat and not consistent with an acute  coronary syndrome Appreciate cardiology input 2D echocardiogram showed a normal LVEF of 55 to 60% with mild RV dysfunction and mild RV enlargement.  No evidence of pulmonary hypertension Patient was on a heparin drip which was discontinued       Diabetes mellitus with complications of hyperglycemia and stage IV chronic kidney disease Continue consistent carbohydrate diet Continue glimepiride       AKI superimposed on stage IV chronic kidney disease Anemia of chronic kidney disease Most likely secondary to diuretics the patient received on admission Creatinine bumped from 2.6-3.5 and is back down to baseline Follow-up with nephrologist as an outpatient Hemoglobin is stable      GERD Continue PPI     Hypertension Continue carvedilol      BPH Continue Proscar  Patient was seen and examined at the bedside and is in stable condition for discharge       Consultants: Nephrologist, cardiologist Procedures performed:   Disposition: Home health Diet recommendation:  Discharge Diet Orders (From admission, onward)     Start     Ordered   06/01/23 0000  Diet - low sodium heart healthy        06/01/23 1211   06/01/23 0000  Diet Carb Modified        06/01/23 1211           Cardiac and Carb modified diet DISCHARGE MEDICATION: Allergies as of 06/01/2023       Reactions   Amlodipine Swelling   Cymbalta [duloxetine Hcl] Other (See Comments)   Oxycodone Other (See Comments)        Medication List     STOP taking these medications    tamsulosin 0.4 MG Caps capsule Commonly known as: FLOMAX       TAKE these medications    albuterol 108 (90 Base) MCG/ACT inhaler Commonly known as: VENTOLIN HFA INHALE 2 PUFFS BY MOUTH EVERY 6 HOURS AS NEEDED FOR WHEEZING FOR SHORTNESS OF BREATH   allopurinol 100 MG tablet Commonly known as: Zyloprim Take 1 tablet (100 mg total) by mouth daily.   atorvastatin 40 MG tablet Commonly known as: LIPITOR Take 1 tablet (40 mg  total) by mouth daily.   Breo Ellipta 200-25 MCG/ACT Aepb Generic drug: fluticasone furoate-vilanterol USE 1 INHALATION BY MOUTH ONCE  DAILY AT THE SAME TIME EACH DAY   carvedilol 12.5 MG tablet Commonly known as: COREG TAKE 1 TABLET BY MOUTH TWICE  DAILY WITH MEALS   doxazosin 2 MG tablet Commonly known as: Cardura Take 1 tablet (2 mg total) by mouth daily.   finasteride 5 MG tablet Commonly known as: PROSCAR TAKE 1 TABLET BY MOUTH DAILY   furosemide 40 MG tablet Commonly known as: LASIX Take 1 tablet (40 mg total) by mouth daily.   glimepiride 2 MG tablet Commonly known as: AMARYL TAKE 2 TABLETS BY MOUTH DAILY  BEFORE BREAKFAST   levothyroxine 50 MCG tablet Commonly known as: SYNTHROID Take 1 tablet (50 mcg total) by mouth daily.   losartan 100 MG tablet Commonly known  as: COZAAR TAKE 1 TABLET BY MOUTH DAILY   pantoprazole 40 MG tablet Commonly known as: PROTONIX Take 1 tablet (40 mg total) by mouth daily.   pioglitazone 30 MG tablet Commonly known as: ACTOS Take 30 mg by mouth daily.   vitamin B-12 100 MCG tablet Commonly known as: CYANOCOBALAMIN Take 100 mcg by mouth daily.               Durable Medical Equipment  (From admission, onward)           Start     Ordered   06/01/23 1348  For home use only DME Walker rolling  Once       Question Answer Comment  Walker: With 5 Inch Wheels   Patient needs a walker to treat with the following condition Generalized weakness      06/01/23 1348   06/01/23 1235  For home use only DME oxygen  Once       Question Answer Comment  Length of Need 6 Months   Mode or (Route) Nasal cannula   Liters per Minute 2   Frequency Continuous (stationary and portable oxygen unit needed)   Oxygen conserving device Yes   Oxygen delivery system Gas      06/01/23 1234            Follow-up Information     [facility], [facility] Follow up.   Specialty: Home Health Services Why: [facility] will provide  home health services.  They will call you in the next 24-48 hours to set up services. Contact information: [address] [place] [address] [phone]         [facility], [facility] Follow up.   Why: [employer] will provide home oxygen before you are discharged to home. Contact information: [address] [place] [address] [phone]                Discharge Exam: Filed Weights   05/30/23 1311 05/31/23 0500 06/01/23 0500  Weight: 88.9 kg 84.5 kg 85.7 kg   Vitals and nursing note reviewed.  Constitutional:      General: He is not in acute distress. HENT:     Head: Normocephalic and atraumatic.  Eyes:     Conjunctiva/sclera: Conjunctivae normal.     Pupils: Pupils are equal, round, and reactive to light.  Cardiovascular:     Rate and Rhythm: Normal rate and regular rhythm.  Pulmonary:     Effort: Pulmonary effort is normal. No respiratory distress.     Comments: Scattered expiratory wheezing present on coughing otherwise lungs were clear to auscultation bilaterally Abdominal:     General: There is no distension.     Tenderness: There is no guarding.  Musculoskeletal:        General: No deformity or signs of injury.     Cervical back: Neck supple.  Skin:    Findings: No lesion or rash.  Neurological:     General: No focal deficit present.     Mental Status: He is alert. Mental status is at baseline.       Condition at discharge: stable  The results of significant diagnostics from this hospitalization (including imaging, microbiology, ancillary and laboratory) are listed below for reference.   Imaging Studies: VAS [place] LOWER EXTREMITY VENOUS (DVT) Result Date: 05/31/2023  Lower Venous DVT Study Patient Name:  MARKO SKALSKI  Date of Exam:   05/31/2023 Medical Rec #: [id number]  Accession #:    [id number] Date of Birth: 1934/07/05       Patient Gender: M Patient Age:   25 years Exam Location:  [facility] Procedure:      VAS [place]  LOWER EXTREMITY VENOUS (DVT) Referring Phys: PRANAV PATEL --------------------------------------------------------------------------------  Indications: Edema.  Risk Factors: Hx of venous insufficiency. Comparison Study: Previous exams on 01/09/2022 & 11/19/2020 were negative for DVT Performing Technologist: Ernestene Mention RVT, RDMS  Examination Guidelines: A complete evaluation includes B-mode imaging, spectral Doppler, color Doppler, and power Doppler as needed of all accessible portions of each vessel. Bilateral testing is considered an integral part of a complete examination. Limited examinations for reoccurring indications may be performed as noted. The reflux portion of the exam is performed with the patient in reverse Trendelenburg.  +---------+---------------+---------+-----------+----------+--------------+ RIGHT    CompressibilityPhasicitySpontaneityPropertiesThrombus Aging +---------+---------------+---------+-----------+----------+--------------+ CFV      Full           Yes      Yes                                 +---------+---------------+---------+-----------+----------+--------------+ SFJ      Full                                                        +---------+---------------+---------+-----------+----------+--------------+ FV Prox  Full           Yes      Yes                                 +---------+---------------+---------+-----------+----------+--------------+ FV Mid   Full           Yes      Yes                                 +---------+---------------+---------+-----------+----------+--------------+ FV DistalFull           Yes      Yes                                 +---------+---------------+---------+-----------+----------+--------------+ PFV      Full                                                        +---------+---------------+---------+-----------+----------+--------------+ POP      Full           Yes      Yes                                  +---------+---------------+---------+-----------+----------+--------------+ PTV      Full                                                        +---------+---------------+---------+-----------+----------+--------------+  PERO     Full                                                        +---------+---------------+---------+-----------+----------+--------------+   +---------+---------------+---------+-----------+----------+--------------+ LEFT     CompressibilityPhasicitySpontaneityPropertiesThrombus Aging +---------+---------------+---------+-----------+----------+--------------+ CFV      Full           Yes      Yes                                 +---------+---------------+---------+-----------+----------+--------------+ SFJ      Full                                                        +---------+---------------+---------+-----------+----------+--------------+ FV Prox  Full           Yes      Yes                                 +---------+---------------+---------+-----------+----------+--------------+ FV Mid   Full           Yes      Yes                                 +---------+---------------+---------+-----------+----------+--------------+ FV DistalFull           Yes      Yes                                 +---------+---------------+---------+-----------+----------+--------------+ PFV      Full                                                        +---------+---------------+---------+-----------+----------+--------------+ POP      Full           Yes      Yes        dilated                  +---------+---------------+---------+-----------+----------+--------------+ PTV      Full                                                        +---------+---------------+---------+-----------+----------+--------------+ PERO     Full                                                         +---------+---------------+---------+-----------+----------+--------------+     Summary: BILATERAL: -  No evidence of deep vein thrombosis seen in the lower extremities, bilaterally. -No evidence of popliteal cyst, bilaterally.   *See table(s) above for measurements and observations. Electronically signed by Gerarda Fraction on 05/31/2023 at 6:21:17 PM.    Final    ECHOCARDIOGRAM COMPLETE Result Date: 05/31/2023    ECHOCARDIOGRAM REPORT   Patient Name:   YOSEPH HAILE Date of Exam: 05/31/2023 Medical Rec #:  [id number]      Height:       74.0 in Accession #:    [id number]     Weight:       186.3 lb Date of Birth:  [birth year]      BSA:          2.109 m Patient Age:    88 years       BP:           134/68 mmHg Patient Gender: M              HR:           67 bpm. Exam Location:  Inpatient Procedure: 2D Echo, Cardiac Doppler and Color Doppler (Both Spectral and Color            Flow Doppler were utilized during procedure). Indications:    CHF  History:        Patient has prior history of Echocardiogram examinations, most                 recent 11/30/2019. COPD; Risk Factors:Hypertension and Diabetes.  Sonographer:    Amy Chionchio Referring Phys: [id number] PRANAV M PATEL IMPRESSIONS  1. Left ventricular ejection fraction, by estimation, is 55 to 60%. The left ventricle has normal function. Left ventricular endocardial border not optimally defined to evaluate regional wall motion. There is mild concentric left ventricular hypertrophy. Left ventricular diastolic parameters are consistent with Grade I diastolic dysfunction (impaired relaxation).  2. Right ventricular systolic function is mildly reduced. The right ventricular size is mildly enlarged. Tricuspid regurgitation signal is inadequate for assessing PA pressure.  3. Right atrial size was mildly dilated.  4. The mitral valve is normal in structure. No evidence of mitral valve regurgitation. No evidence of mitral stenosis.  5. The aortic valve is tricuspid. There is mild  calcification of the aortic valve. Aortic valve regurgitation is not visualized. No aortic stenosis is present.  6. The inferior vena cava is dilated in size with <50% respiratory variability, suggesting right atrial pressure of 15 mmHg.  7. Technically difficult study with very poor images. FINDINGS  Left Ventricle: Left ventricular ejection fraction, by estimation, is 55 to 60%. The left ventricle has normal function. Left ventricular endocardial border not optimally defined to evaluate regional wall motion. The left ventricular internal cavity size was normal in size. There is mild concentric left ventricular hypertrophy. Left ventricular diastolic parameters are consistent with Grade I diastolic dysfunction (impaired relaxation). Right Ventricle: The right ventricular size is mildly enlarged. No increase in right ventricular wall thickness. Right ventricular systolic function is mildly reduced. Tricuspid regurgitation signal is inadequate for assessing PA pressure. Left Atrium: Left atrial size was normal in size. Right Atrium: Right atrial size was mildly dilated. Pericardium: There is no evidence of pericardial effusion. Mitral Valve: The mitral valve is normal in structure. No evidence of mitral valve regurgitation. No evidence of mitral valve stenosis. MV peak gradient, 3.9 mmHg. The mean mitral valve gradient is 1.0 mmHg. Tricuspid Valve: The tricuspid valve is normal in structure. Tricuspid valve regurgitation is  not demonstrated. Aortic Valve: The aortic valve is tricuspid. There is mild calcification of the aortic valve. Aortic valve regurgitation is not visualized. No aortic stenosis is present. Aortic valve mean gradient measures 2.0 mmHg. Aortic valve peak gradient measures 4.8 mmHg. Aortic valve area, by VTI measures 2.68 cm. Pulmonic Valve: The pulmonic valve was not well visualized. Pulmonic valve regurgitation is not visualized. Aorta: The aortic root is normal in size and structure. Venous: The  inferior vena cava is dilated in size with less than 50% respiratory variability, suggesting right atrial pressure of 15 mmHg. IAS/Shunts: The interatrial septum was not well visualized.  LEFT VENTRICLE PLAX 2D LVIDd:         3.70 cm   Diastology LVIDs:         2.50 cm   LV e' medial:    3.37 cm/s LV PW:         1.10 cm   LV E/e' medial:  17.4 LV IVS:        1.50 cm   LV e' lateral:   9.14 cm/s LVOT diam:     2.20 cm   LV E/e' lateral: 6.4 LV SV:         60 LV SV Index:   28 LVOT Area:     3.80 cm  RIGHT VENTRICLE         IVC TAPSE (M-mode): 2.0 cm  IVC diam: 2.40 cm AORTIC VALVE AV Area (Vmax):    2.86 cm AV Area (Vmean):   2.62 cm AV Area (VTI):     2.68 cm AV Vmax:           110.00 cm/s AV Vmean:          71.600 cm/s AV VTI:            0.224 m AV Peak Grad:      4.8 mmHg AV Mean Grad:      2.0 mmHg LVOT Vmax:         82.90 cm/s LVOT Vmean:        49.300 cm/s LVOT VTI:          0.158 m LVOT/AV VTI ratio: 0.71  AORTA Ao Root diam: 3.70 cm Ao Asc diam:  3.40 cm MITRAL VALVE MV Area (PHT): 2.07 cm     SHUNTS MV Area VTI:   2.06 cm     Systemic VTI:  0.16 m MV Peak grad:  3.9 mmHg     Systemic Diam: 2.20 cm MV Mean grad:  1.0 mmHg MV Vmax:       0.98 m/s MV Vmean:      47.1 cm/s MV Decel Time: 367 msec MV E velocity: 58.50 cm/s MV A velocity: 114.00 cm/s MV E/A ratio:  0.51 Dalton McleanMD Electronically signed by Wilfred Lacy Signature Date/Time: 05/31/2023/3:31:23 PM    Final    NM Pulmonary Perfusion Result Date: 05/31/2023 CLINICAL DATA:  Worsening shortness of breath. Elevated D-dimer. Some chest tightness. EXAM: NUCLEAR MEDICINE PERFUSION LUNG SCAN TECHNIQUE: Perfusion images were obtained in multiple projections after intravenous injection of radiopharmaceutical. Ventilation scans intentionally deferred if perfusion scan and chest x-ray adequate for interpretation during COVID 19 epidemic. RADIOPHARMACEUTICALS:  4.32 mCi Tc-49m MAA IV COMPARISON:  Chest radiographs dated 05/30/2023 and 05/12/2023.  Chest CT without contrast dated 05/30/2023. Nuclear medicine perfusion lung scan dated 05/12/2023. FINDINGS: Stable heterogeneous perfusion of both lungs, left greater than right, compatible with the changes of COPD seen on the chest CT without contrast. No interval  perfusion defects suspicious for pulmonary embolism. IMPRESSION: 1. No evidence of pulmonary embolism. 2. Stable heterogeneous perfusion of the lungs compatible with previously demonstrated COPD. Electronically Signed   By: Beckie Salts M.D.   On: 05/31/2023 13:00   CT CHEST WO CONTRAST Result Date: 05/30/2023 CLINICAL DATA:  Chronic cough for several weeks EXAM: CT CHEST WITHOUT CONTRAST TECHNIQUE: Multidetector CT imaging of the chest was performed following the standard protocol without IV contrast. RADIATION DOSE REDUCTION: This exam was performed according to the departmental dose-optimization program which includes automated exposure control, adjustment of the mA and/or kV according to patient size and/or use of iterative reconstruction technique. COMPARISON:  Chest x-ray from earlier in the same day. FINDINGS: Cardiovascular: Somewhat limited due to lack of IV contrast. Atherosclerotic calcifications of the aorta are noted. No cardiac enlargement is seen. Pulmonary artery is not significantly enlarged. Mediastinum/Nodes: Thoracic inlet is within normal limits. No hilar or mediastinal adenopathy is noted. The esophagus as visualized is within normal limits. Lungs/Pleura: The lungs are well aerated bilaterally. Diffuse emphysematous changes are noted. Mild basilar scarring is noted bilaterally. No focal confluent infiltrate or parenchymal nodule is seen. Upper Abdomen: Visualized upper abdomen shows changes of prior cholecystectomy. Musculoskeletal: No chest wall mass or suspicious bone lesions identified. IMPRESSION: Severe emphysematous changes without acute abnormality. No other focal abnormality is seen. Aortic Atherosclerosis (ICD10-I70.0)  and Emphysema (ICD10-J43.9). Electronically Signed   By: Alcide Clever M.D.   On: 05/30/2023 22:37   DG Chest Port 1 View Result Date: 05/30/2023 CLINICAL DATA:  Shortness of breath with history of COPD EXAM: PORTABLE CHEST 1 VIEW COMPARISON:  05/12/2023 FINDINGS: Incompletely imaged cervical spine fixation. Midline trachea. Normal heart size. Atherosclerosis in the transverse aorta. Tortuous thoracic aorta. No pleural effusion or pneumothorax. Hyperinflation. Moderate, increased pulmonary interstitial thickening and coarsening. No lobar consolidation. Similar left base scarring. IMPRESSION: Hyperinflation with increased interstitial thickening/coarsening. This could all be the sequelae of smoking/chronic bronchitis. Cannot exclude superimposed pulmonary venous congestion. Aortic Atherosclerosis (ICD10-I70.0). Electronically Signed   By: Jeronimo Greaves M.D.   On: 05/30/2023 13:51   DG Chest 2 View Result Date: 05/20/2023 CLINICAL DATA:  Perfusion study EXAM: CHEST - 2 VIEW COMPARISON:  Chest radiograph 12/22/2021 FINDINGS: Stable cardiac and mediastinal contours. No large area pulmonary consolidation. Persistent background of chronic interstitial opacities. No pleural effusion or pneumothorax. Thoracic spine degenerative changes. IMPRESSION: No active cardiopulmonary disease. Chronic interstitial lung disease at the bases bilaterally. Electronically Signed   By: Annia Belt M.D.   On: 05/20/2023 12:30   NM Pulmonary Perfusion Result Date: 05/12/2023 CLINICAL DATA:  Concern for pulmonary embolism.  Short of breath EXAM: NUCLEAR MEDICINE PERFUSION LUNG SCAN TECHNIQUE: Perfusion images were obtained in multiple projections after intravenous injection of radiopharmaceutical. RADIOPHARMACEUTICALS:  4.2 mCi Tc-48m MAA COMPARISON:  Radiograph same day, CT chest 03/13/2022 FINDINGS: There is poor heterogeneous perfusion to the lung bases which corresponds interstitial lung disease seen on comparison CT. No wedge-shaped  peripheral perfusion defects to suggest acute pulmonary embolism. IMPRESSION: 1. No evidence acute pulmonary disease. 2. Lower lobe chronic lung disease. Electronically Signed   By: Genevive Bi M.D.   On: 05/12/2023 15:31   DG Chest 2 View Result Date: 05/12/2023 CLINICAL DATA:  Dyspnea on exertion.  Cough EXAM: CHEST - 2 VIEW COMPARISON:  CT chest 03/13/2022 FINDINGS: Normal cardiac silhouette. Initial lung disease with a lower lobe predominance unchanged from comparison CT. Upper lobe emphysema noted. No focal consolidation. No pneumothorax IMPRESSION: Chronic interstitial lung disease  at the lung bases. Electronically Signed   By: Genevive Bi M.D.   On: 05/12/2023 15:29    Microbiology: Results for orders placed or performed during the hospital encounter of 05/30/23  Resp panel by RT-PCR (RSV, Flu A&B, Covid) Anterior Nasal Swab     Status: None   Collection Time: 05/30/23  1:16 PM   Specimen: Anterior Nasal Swab  Result Value Ref Range Status   SARS Coronavirus 2 by RT PCR NEGATIVE NEGATIVE Final    Comment: (NOTE) SARS-CoV-2 target nucleic acids are NOT DETECTED.  The SARS-CoV-2 RNA is generally detectable in upper respiratory specimens during the acute phase of infection. The lowest concentration of SARS-CoV-2 viral copies this assay can detect is 138 copies/mL. A negative result does not preclude SARS-Cov-2 infection and should not be used as the sole basis for treatment or other patient management decisions. A negative result may occur with  improper specimen collection/handling, submission of specimen other than nasopharyngeal swab, presence of viral mutation(s) within the areas targeted by this assay, and inadequate number of viral copies(<138 copies/mL). A negative result must be combined with clinical observations, patient history, and epidemiological information. The expected result is Negative.  Fact Sheet for Patients:   BloggerCourse.com  Fact Sheet for Healthcare Providers:  SeriousBroker.it  This test is no t yet approved or cleared by the [place] FDA and  has been authorized for detection and/or diagnosis of SARS-CoV-2 by FDA under an Emergency Use Authorization (EUA). This EUA will remain  in effect (meaning this test can be used) for the duration of the COVID-19 declaration under Section 564(b)(1) of the Act, 21 U.S.C.section 360bbb-3(b)(1), unless the authorization is terminated  or revoked sooner.       Influenza A by PCR NEGATIVE NEGATIVE Final   Influenza B by PCR NEGATIVE NEGATIVE Final    Comment: (NOTE) The Xpert Xpress SARS-CoV-2/FLU/RSV plus assay is intended as an aid in the diagnosis of influenza from Nasopharyngeal swab specimens and should not be used as a sole basis for treatment. Nasal washings and aspirates are unacceptable for Xpert Xpress SARS-CoV-2/FLU/RSV testing.  Fact Sheet for Patients: BloggerCourse.com  Fact Sheet for Healthcare Providers: SeriousBroker.it  This test is not yet approved or cleared by the [place] FDA and has been authorized for detection and/or diagnosis of SARS-CoV-2 by FDA under an Emergency Use Authorization (EUA). This EUA will remain in effect (meaning this test can be used) for the duration of the COVID-19 declaration under Section 564(b)(1) of the Act, 21 U.S.C. section 360bbb-3(b)(1), unless the authorization is terminated or revoked.     Resp Syncytial Virus by PCR NEGATIVE NEGATIVE Final    Comment: (NOTE) Fact Sheet for Patients: BloggerCourse.com  Fact Sheet for Healthcare Providers: SeriousBroker.it  This test is not yet approved or cleared by the [place] FDA and has been authorized for detection and/or diagnosis of SARS-CoV-2 by FDA under an Emergency Use  Authorization (EUA). This EUA will remain in effect (meaning this test can be used) for the duration of the COVID-19 declaration under Section 564(b)(1) of the Act, 21 U.S.C. section 360bbb-3(b)(1), unless the authorization is terminated or revoked.  Performed at [facility], [address] [place], [place] [address]     Labs: CBC: Recent Labs  Lab 05/30/23 1348 05/31/23 0859 06/01/23 0544  WBC 4.9 3.9* 5.2  NEUTROABS 3.6  --   --   HGB 10.1* 10.5* 9.2*  HCT 30.8* 32.5* 28.5*  MCV 91.9 91.5 90.5  PLT  214 198 202   Basic Metabolic Panel: Recent Labs  Lab 05/30/23 1348 05/31/23 0859 06/01/23 0544  NA 141 138 137  K 3.7 3.8 3.9  CL 107 106 108  CO2 22 19* 21*  GLUCOSE 164* 236* 193*  BUN 46* 55* 65*  CREATININE 2.61* 3.15* 2.79*  CALCIUM 9.4 9.3 8.6*   Liver Function Tests: Recent Labs  Lab 05/30/23 1348 05/31/23 0859  AST 19 21  ALT 19 22  ALKPHOS 75 66  BILITOT 1.0 1.2  PROT 6.6 6.1*  ALBUMIN 3.7 2.8*   CBG: Recent Labs  Lab 05/31/23 1721 05/31/23 1946 05/31/23 2129 06/01/23 0806 06/01/23 1215  GLUCAP 210* 211* 200* 158* 214*    Discharge time spent: greater than 30 minutes.  Signed: Lucile Shutters, MD [facility] 06/01/2023

## 2023-06-01 NOTE — Progress Notes (Signed)
 SATURATION QUALIFICATIONS: (This note is used to comply with regulatory documentation for home oxygen)  Patient Saturations on Room Air at Rest = 97%  Patient Saturations on Room Air while Ambulating = 88%  Patient Saturations on 2 Liters of oxygen while Ambulating = 92%  Please briefly explain why patient needs home oxygen: Desaturates while ambulating on RA

## 2023-06-01 NOTE — Evaluation (Signed)
 Occupational Therapy Evaluation Patient Details Name: Nicholas Patton MRN: [id number] DOB: 07-05-1934 Today's Date: 06/01/2023   History of Present Illness   89 yo male admitted 3/23 with SOB. PMHx: Asthma, COPD, DM, HTN, insomnia, left ear hearing loss, vertigo, overactive bladder, cervical fusion, gout, CKD     Clinical Impressions Pt c/o SOB with activity. Pt lives alone, son/daughter can check on and assist once/day if needed. PLOF independent, driving, no AD for mobility. Pt currently close to baseline, mod I/supervision for ADLs, able to ambulate 200 feet without supplemental O2, stayed above 93%. With 2L O2 via [place] stayed at 99/100%. Pt balance was precarious ambulating around room with 1 minor instance of LOB, used door for support. Pt used RW and balance improved. Pt would benefit from RW for return home. Pt has no further acute OT or follow up needs.     If plan is discharge home, recommend the following:   A little help with walking and/or transfers;A little help with bathing/dressing/bathroom;Assistance with cooking/housework;Assist for transportation;Help with stairs or ramp for entrance     Functional Status Assessment   Patient has had a recent decline in their functional status and demonstrates the ability to make significant improvements in function in a reasonable and predictable amount of time.     Equipment Recommendations   Other (comment) (RW)     Recommendations for Other Services         Precautions/Restrictions   Precautions Precautions: Fall Recall of Precautions/Restrictions: Intact Restrictions Weight Bearing Restrictions Per Provider Order: No     Mobility Bed Mobility Overal bed mobility: Modified Independent                  Transfers Overall transfer level: Needs assistance Equipment used: Rolling walker (2 wheels) Transfers: Sit to/from Stand Sit to Stand: Supervision           General transfer comment: Pt able to  ambulate with/without RW, precarious balance without RW, better with RW      Balance Overall balance assessment: Mild deficits observed, not formally tested                                         ADL either performed or assessed with clinical judgement   ADL Overall ADL's : At baseline;Needs assistance/impaired                                       General ADL Comments: Pt supervision for safety     Vision Baseline Vision/History: 0 No visual deficits Ability to See in Adequate Light: 0 Adequate Patient Visual Report: Other (comment) (Pt reports history of R eye problem, surgery last november, does not recall what for.)       Perception         Praxis         Pertinent Vitals/Pain Pain Assessment Pain Assessment: No/denies pain     Extremity/Trunk Assessment Upper Extremity Assessment Upper Extremity Assessment: Overall WFL for tasks assessed;LUE deficits/detail LUE Deficits / Details: overall WFLs, some notable decreased overhead use of LUE due to shoulder pain/stiffness. functional ROM but painful/slow, trouble with external rotation LUE: Shoulder pain with ROM LUE Sensation: WNL LUE Coordination: WNL   Lower Extremity Assessment Lower Extremity Assessment: Defer to PT evaluation   Cervical / Trunk Assessment  Cervical / Trunk Assessment: Normal   Communication Communication Communication: No apparent difficulties   Cognition Arousal: Alert Behavior During Therapy: WFL for tasks assessed/performed                                 Following commands: Intact       Cueing  General Comments   Cueing Techniques: Verbal cues      Exercises     Shoulder Instructions      Home Living Family/patient expects to be discharged to:: Private residence Living Arrangements: Alone Available Help at Discharge: Family;Available PRN/intermittently Type of Home: House Home Access: Ramped entrance     Home  Layout: One level               Home Equipment: Cane - single point   Additional Comments: Pt lives alone, son/daughter can come by once a day to check/assist if needed.      Prior Functioning/Environment Prior Level of Function : Driving;Independent/Modified Independent             Mobility Comments: patient drives and does not use AD at baseline ADLs Comments: Shops, etc for himself.    OT Problem List: Decreased strength;Decreased range of motion;Decreased activity tolerance;Impaired balance (sitting and/or standing)   OT Treatment/Interventions:        OT Goals(Current goals can be found in the care plan section)   Acute Rehab OT Goals Patient Stated Goal: to return home OT Goal Formulation: With patient Time For Goal Achievement: 06/15/23 Potential to Achieve Goals: Good   OT Frequency:       Co-evaluation              AM-PAC OT "6 Clicks" Daily Activity     Outcome Measure Help from another person eating meals?: None Help from another person taking care of personal grooming?: A Little Help from another person toileting, which includes using toliet, bedpan, or urinal?: A Little Help from another person bathing (including washing, rinsing, drying)?: A Little Help from another person to put on and taking off regular upper body clothing?: A Little Help from another person to put on and taking off regular lower body clothing?: A Little 6 Click Score: 19   End of Session Equipment Utilized During Treatment: Gait belt;Rolling walker (2 wheels) Nurse Communication: Mobility status  Activity Tolerance: Patient tolerated treatment well Patient left: in bed;with call bell/phone within reach;with family/visitor present  OT Visit Diagnosis: Unsteadiness on feet (R26.81);Other abnormalities of gait and mobility (R26.89);Muscle weakness (generalized) (M62.81)                Time: 1610-9604 OT Time Calculation (min): 35 min Charges:  OT General Charges $OT  Visit: 1 Visit OT Evaluation $OT Eval Low Complexity: 1 Low OT Treatments $Self Care/Home Management : 8-22 mins  [address] OTR/L   Alexis Goodell 06/01/2023, 1:02 PM

## 2023-06-01 NOTE — Inpatient Diabetes Management (Signed)
 Inpatient Diabetes Program Recommendations  AACE/ADA: New Consensus Statement on Inpatient Glycemic Control (2015)  Target Ranges:  Prepandial:   less than 140 mg/dL      Peak postprandial:   less than 180 mg/dL (1-2 hours)      Critically ill patients:  140 - 180 mg/dL   Lab Results  Component Value Date   GLUCAP 158 (H) 06/01/2023   HGBA1C 7.4 (H) 04/23/2023    Review of Glycemic Control  Latest Reference Range & Units 05/30/23 17:21 05/30/23 22:30 05/31/23 08:07  Glucose-Capillary 70 - 99 mg/dL 981 (H) 191 (H) 478 (H)    Latest Reference Range & Units 05/31/23 08:07 05/31/23 12:11 05/31/23 17:21 05/31/23 19:46 05/31/23 21:29 06/01/23 08:06  Glucose-Capillary 70 - 99 mg/dL 295 (H) 621 (H) 308 (H) 211 (H) 200 (H) 158 (H)   Diabetes history: DM 2 Outpatient Diabetes medications: Amaryl 2 mg Daily, Actos 30 mg Daily Current orders for Inpatient glycemic control:  Novolog 0-15 units tid + hs  A1c 7.4% on 2/14 Solumedrol 40 mg Q12 hours Elevated renal function  Note: glucose trends increase after PO intake and steroid dose. Elevated renal function  Inpatient Diabetes Program Recommendations:    -   Consider adding Novolog 2 units tid meal coverage if eating >50% of meals -   Lower Novolog Correction scale to 0-9 units tid + hs  Thanks,  Christena Deem RN, MSN, BC-ADM Inpatient Diabetes Coordinator Team Pager [phone] (8a-5p)

## 2023-06-02 ENCOUNTER — Ambulatory Visit: Payer: Self-pay | Admitting: Pulmonary Disease

## 2023-06-02 ENCOUNTER — Telehealth: Payer: Self-pay | Admitting: *Deleted

## 2023-06-02 NOTE — Telephone Encounter (Addendum)
 Copied from CRM [id number]. Topic: Appointments - Appointment Scheduling >> Jun 02, 2023 10:41 AM Duncan Dull wrote: Patient/patient representative is calling to schedule an appointment. Refer to attachments for appointment information.  Patient was released from the hospital 05/30/23-06/01/23, patient needed to see doctor much sooner.  1. REASON FOR CALL or QUESTION: "What is your reason for calling today?" or "How can I best help you?" or "What question do you have that I can help answer?"     Spoke to daughter, [place], re: hospital follow up appt. Paulette reports that pt does not have any worsening/concerning sx and is comfortable with keeping current appt. Discharge paperwork recommended Pulm F/U in 1 month, and soonest appt that was scheduled was beyond 1 mo. Paulette did endorse having all rx they need post-D/C. Caregiver verbalized understanding and to call back/911 with worsening symptoms.

## 2023-06-02 NOTE — Patient Instructions (Signed)
 Visit Information  Thank you for taking time to visit with me today. Please don't hesitate to contact me if I can be of assistance to you before our next scheduled telephone appointment.  Our next appointment is by telephone  Thursday 06/10/23 at 10:00 am    Please call the care guide team at [phone] if you need to cancel or reschedule your appointment.   Patient Goals/Self-Care Activities: Participate in Transition of Care Program/Attend TOC scheduled calls Take all medications as prescribed Attend all scheduled provider appointments Call provider office for new concerns or questions  Continue pacing activity to avoid episodes of shortness of breath Continue to follow your established action plan for episodes of shortness of breath Continue using home oxygen as prescribed: do not allow anyone to smoke anywhere near your oxygen Call your insurance company to determine the benefits they provide around obtaining alternate/ lightweight portable oxygen delivery systems and then talk to your pulmonary doctor about the options you are interested in Continue working with the home health team that is involved in your care Use assistive devices (your new walker) as needed to prevent falls If you believe your condition is getting worse- contact your care providers (doctors) promptly- reaching out to your doctor early when you have concerns can prevent you from having to go to the hospital  Following is a copy of your care plan:   Goals Addressed             This Visit's Progress    TOC 30-day Program Care Plan   On track    Current Barriers:  Medication management self manages medications with  assistance as needed from adult daughter- reports essentially self-manages "most of the time;" verbalizes very good understanding of the purpose/ dosing/ scheduling of medications, as well well as changes to medications post-hospital discharge on 06/01/23 Provider appointments Hospital follow up  scheduled:  06/08/23:  PCP;  06/16/23:  nephrology provider;  07/19/23: pulmonary provider- confirms on cancellation list for sooner appointment with pulmonary team Home Health services confirmed active through [facility] for RN/ PT/ OT- start of service scheduled for tomorrow 06/03/23 Equipment/DME confirmed obtained walker prior to hospital discharge- reports not having to use much "so far"; new to home O2; confirmed obtained and using prn; reports has concentrator and portable tanks- reports portable tanks "too heavy" for patient- provided education/ instruction to discuss with pulmonary provider: encouraged patient and his daughter to contact insurance provider prior to pulmonary office visit re: concerns with weight of current portable tanks- to obtain specific coverage/ benefits for optional light-weight portable O2 delivery systems  Independent; resides with adult son who works during daytime hours; local daughter [place] is primary caregiver and is very involved in management of all aspects of patient's health care needs  RNCM Clinical Goal(s):  Patient will work with the Care Management team over the next 30 days to address Transition of Care Barriers: Medication Management Provider appointments Home Health services Equipment/DME take all medications exactly as prescribed and will call provider for medication related questions as evidenced by review of same with patient and caregiver during weekly outreaches with TOC 30-day RN CM attend all scheduled medical appointments: as listed above as evidenced by review of same with patient/ caregiver and collaboration as indicated with care providers during weekly outreaches with TOC 30-day program RN CM work with home health team to participate in home health services for RN/ PT/ OT as evidenced by review of home health home visits/ collaboration as indicated with  home health team during weekly outreaches with TOC 30-day program RN CM not experience  hospital admission as evidenced by review of EMR. Hospital Admissions in last 6 months = 1  through collaboration with RN Care manager, provider, and care team.   Interventions: Evaluation of current treatment plan related to  self management and patient's adherence to plan as established by provider  Transitions of Care:  New goal. 06/02/23 Durable Medical Equipment (DME) needs assessed with patient/caregiver Doctor Visits  - discussed the importance of doctor visits Communication with PCP re: successful enrollment into TOC 30-day program Reviewed multiple upcoming provider office visits: confirmed patient is aware of all and has plans to attend as scheduled Full medication review with updating medication list in EHR per patient report  Home Health PT/ RN/ OT services- confirmed active; role of home health services with importance of participation/ ongoing engagement Confirmed currently requiring/ using assistive devices for ambulation - walker prn; patient reports, "not having to use it very much so far" Provided education/ reinforcement around fall prevention Provided education around basic safe use of home O2  Discussed process to begin taking to determine options for lightweight portable home O2 delivery systems: call insurance provider to determine benefits, get questions answered/ discuss with pulmonary provider once information is obtained from insurance company Confirmed/ reinforced action plan for shortness of breath: verbalizes very good understanding/ adherence to same: currently: uses rescue inhaler/ rests- paces activity/ uses new home O2 Successfully enrolled into 30-day TOC program TOC 30-day program initial assessment completed today Provided my direct contact information should questions/ concerns/ needs arise post-TOC call, prior to next RN CM telephone visit    Patient Goals/Self-Care Activities: Participate in Transition of Care Program/Attend TOC scheduled calls Take all  medications as prescribed Attend all scheduled provider appointments Call provider office for new concerns or questions  Continue pacing activity to avoid episodes of shortness of breath Continue to follow your established action plan for episodes of shortness of breath Continue using home oxygen as prescribed: do not allow anyone to smoke anywhere near your oxygen Call your insurance company to determine the benefits they provide around obtaining alternate/ lightweight portable oxygen delivery systems and then talk to your pulmonary doctor about the options you are interested in Continue working with the home health team that is involved in your care Use assistive devices (your new walker) as needed to prevent falls If you believe your condition is getting worse- contact your care providers (doctors) promptly- reaching out to your doctor early when you have concerns can prevent you from having to go to the hospital  Follow Up Plan:  Telephone follow up appointment with care management team member scheduled for:  Thursday 06/10/23 at 10:00 am    Plan for next week's call: Review PCP office visit scheduled for 06/08/23 Review home health visits; use of new walker Review medication adherence Reinforce/ provide education re: COPD action plan; safe use of home O2 Find out if [employer] re: lightweight portable oxygen delivery systems Progression of TOC 30-day program initial assessment       Patient verbalizes understanding of instructions and care plan provided today and agrees to view in [place]. Active MyChart status and patient understanding of how to access instructions and care plan via MyChart confirmed with patient.     If you are experiencing a Mental Health or Behavioral Health Crisis or need someone to talk to, please  call the Suicide and Crisis Lifeline: 988 call the [place]  National Suicide Prevention Lifeline: 253-766-6772 or TTY: 716-636-9384 TTY  (647) 583-6356) to talk to a trained counselor call 1-800-273-TALK (toll free, 24 hour hotline) go to [facility] [address], [place] [phone]) call the [facility] Crisis Line: 203-614-0219 call 911   Caryl Pina, RN, BSN, CCRN Alumnus RN Care Manager  Transitions of Care  [facility] [phone]: direct office

## 2023-06-02 NOTE — Telephone Encounter (Signed)
 Reason for Disposition . [1] Caller requesting NON-URGENT health information AND [2] PCP's office is the best resource  Answer Assessment - Initial Assessment Questions 1. REASON FOR CALL or QUESTION: "What is your reason for calling today?" or "How can I best help you?" or "What question do you have that I can help answer?"     Spoke to daughter, [place], re: hospital follow up appt. Paulette reports that pt does not have any worsening/concerning sx and is comfortable with keeping current appt. Discharge paperwork recommended Pulm F/U in 1 month, and soonest appt that was scheduled was beyond 1 mo. Paulette did endorse having all rx they need post-D/C. Caregiver verbalized understanding and to call back/911 with worsening symptoms.  Protocols used: Information Only Call - No Triage-A-AH

## 2023-06-02 NOTE — Transitions of Care (Post Inpatient/ED Visit) (Signed)
 06/02/2023  Name: Nicholas Patton MRN: [id number] DOB: [birth year]  Today's TOC FU Call Status: Today's TOC FU Call Status:: Successful TOC FU Call Completed TOC FU Call Complete Date: 06/02/23 Patient's Name and Date of Birth confirmed.  Transition Care Management Follow-up Telephone Call Date of Discharge: 06/01/23 Discharge Facility: [facility]) Type of Discharge: Inpatient Admission Primary Inpatient Discharge Diagnosis:: Acute hypoxic Respiratory Failure/ COPD exacerbation/ chronic cough/ shortness of bretah and fatigue How have you been since you were released from the hospital?: Same ("I guess I am better, but pretty much the same; I have had this cough and this shortness of breath for a long time.  I did get the oxygen and the walker.  I am independent and still drive myself wherever I need to go") Any questions or concerns?: No  Items Reviewed: Did you receive and understand the discharge instructions provided?: Yes (thoroughly reviewed with patient and his daughter who verbalizes good understanding of same) Medications obtained,verified, and reconciled?: Yes (Medications Reviewed) (Full medication reconciliation/ review completed; no concerns or discrepancies identified; confirmed patient obtained/ is taking all newly Rx'd medications as instructed; self-manages medications and denies questions/ concerns around medications today) Any new allergies since your discharge?: No Dietary orders reviewed?: Yes Type of Diet Ordered:: "Healthy as possible" Do you have support at home?: Yes People in Home: child(ren), adult Name of Support/Comfort Primary Source: Reports independent in self-care activities; resides with adult son who works during day time hours; supportive local daughter is also proimary caregiver and assists as/ if needed/ indicated  Medications Reviewed Today: Medications Reviewed Today     Reviewed by Michaela Corner, RN (Registered Nurse) on 06/02/23 at 1628  Med  List Status: <None>   Medication Order Taking? Sig Documenting Provider Last Dose Status Informant  albuterol (VENTOLIN HFA) 108 (90 Base) MCG/ACT inhaler [id number] Yes INHALE 2 PUFFS BY MOUTH EVERY 6 HOURS AS NEEDED FOR WHEEZING FOR SHORTNESS OF BREATH Hunsucker, Lesia Sago, MD Taking Active Child, Self, Pharmacy Records  allopurinol (ZYLOPRIM) 100 MG tablet [id number] Yes Take 1 tablet (100 mg total) by mouth daily. Rodolph Bong, MD Taking Active Child, Self, Pharmacy Records  atorvastatin (LIPITOR) 40 MG tablet [id number] Yes Take 1 tablet (40 mg total) by mouth daily. Corwin Levins, MD Taking Active Child, Self, Pharmacy Records  [facility] ELLIPTA 200-25 MCG/ACT AEPB [id number] Yes USE 1 INHALATION BY MOUTH ONCE  DAILY AT THE SAME TIME EACH DAY Hunsucker, Lesia Sago, MD Taking Active Child, Self, Pharmacy Records  carvedilol (COREG) 12.5 MG tablet [id number] Yes TAKE 1 TABLET BY MOUTH TWICE  DAILY WITH MEALS Corwin Levins, MD Taking Active Child, Self, Pharmacy Records  cholecalciferol (VITAMIN D3) 25 MCG (1000 UNIT) tablet [id number] Yes Take 1,000 Units by mouth daily. 06/02/23: Reports during TOC call "taking OTC 2,000 Units every day" Corwin Levins, MD Taking Active Self           Med Note Otelia Limes Jun 02, 2023  4:04 PM) 06/02/23: Reports during TOC call "taking OTC 2,000 Units every day"   doxazosin (CARDURA) 2 MG tablet [id number] Yes Take 1 tablet (2 mg total) by mouth daily. Corwin Levins, MD Taking Active Child, Self, Pharmacy Records  finasteride (PROSCAR) 5 MG tablet [id number] Yes TAKE 1 TABLET BY MOUTH DAILY Bjorn Pippin, MD Taking Active Child, Self, Pharmacy Records  furosemide (LASIX) 40 MG tablet [id number]  Take 1 tablet (40 mg total) by mouth daily. Maisie Fus,  MD  Expired 05/23/23 2359            Med Note Michaela Corner   Wed Jun 02, 2023  4:01 PM) 06/02/23: reports during [facility] call is taking as prescribed  glimepiride (AMARYL) 2 MG tablet [id number] Yes TAKE 2 TABLETS BY  MOUTH DAILY  BEFORE BREAKFAST Corwin Levins, MD Taking Active Child, Self, Pharmacy Records  levothyroxine (SYNTHROID) 50 MCG tablet [id number] Yes Take 1 tablet (50 mcg total) by mouth daily. Corwin Levins, MD Taking Active Child, Self, Pharmacy Records  losartan (COZAAR) 100 MG tablet [id number] Yes TAKE 1 TABLET BY MOUTH DAILY Corwin Levins, MD Taking Active Child, Self, Pharmacy Records  pantoprazole (PROTONIX) 40 MG tablet [id number] Yes Take 1 tablet (40 mg total) by mouth daily. Corwin Levins, MD Taking Active Child, Self, Pharmacy Records  pioglitazone (ACTOS) 30 MG tablet [id number] Yes Take 30 mg by mouth daily. [provider] Taking Active Child, Self, Pharmacy Records  vitamin B-12 (CYANOCOBALAMIN) 100 MCG tablet [id number] Yes Take 100 mcg by mouth daily. [provider] Taking Active Child, Self, Pharmacy Records           Home Care and Equipment/Supplies: Were Home Health Services Ordered?: Yes Name of Home Health Agency:: [facility]: RN/ PT/ OT Has Agency set up a time to come to your home?: Yes First Home Health Visit Date: 06/03/23 Any new equipment or medical supplies ordered?: Yes (home O2 and rolling walker) Name of Medical supply agency?: [employer]:  [phone] Were you able to get the equipment/medical supplies?: Yes Do you have any questions related to the use of the equipment/supplies?: No  Functional Questionnaire: Do you need assistance with bathing/showering or dressing?: No Do you need assistance with meal preparation?: No Do you need assistance with eating?: No Do you have difficulty maintaining continence: No Do you need assistance with getting out of bed/getting out of a chair/moving?: No Do you have difficulty managing or taking your medications?: No  Follow up appointments reviewed: PCP Follow-up appointment confirmed?: Yes Date of PCP follow-up appointment?: 06/08/23 Follow-up Provider: PCP- Dr. Jonny Ruiz Specialist [facility] Follow-up  appointment confirmed?: Yes Date of Specialist follow-up appointment?: 06/16/23 (nephrology provider: Dr. Richelle Ito) Follow-Up Specialty Provider:: 07/19/23: pulmonary provider- Dr. Judeth Horn--- reports this was earliest available appointment- reports he is on cancellation list Do you need transportation to your follow-up appointment?: No Do you understand care options if your condition(s) worsen?: Yes-patient verbalized understanding  SDOH Interventions Today    Flowsheet Row Most Recent Value  SDOH Interventions   Food Insecurity Interventions Intervention Not Indicated  Housing Interventions Intervention Not Indicated  Transportation Interventions Intervention Not Indicated  [drives self]  Utilities Interventions Intervention Not Indicated       Goals Addressed             This Visit's Progress    TOC 30-day Program Care Plan   On track    Current Barriers:  Medication management self manages medications with  assistance as needed from adult daughter- reports essentially self-manages "most of the time;" verbalizes very good understanding of the purpose/ dosing/ scheduling of medications, as well well as changes to medications post-hospital discharge on 06/01/23 Provider appointments Hospital follow up scheduled:  06/08/23:  PCP;  06/16/23:  nephrology provider;  07/19/23: pulmonary provider- confirms on cancellation list for sooner appointment with pulmonary team Home Health services confirmed active through [facility] for RN/ PT/ OT- start of service scheduled for tomorrow 06/03/23 Equipment/DME confirmed obtained walker prior to hospital  discharge- reports not having to use much "so far"; new to home O2; confirmed obtained and using prn; reports has concentrator and portable tanks- reports portable tanks "too heavy" for patient- provided education/ instruction to discuss with pulmonary provider: encouraged patient and his daughter to contact insurance provider prior to pulmonary office  visit re: concerns with weight of current portable tanks- to obtain specific coverage/ benefits for optional light-weight portable O2 delivery systems  Independent; resides with adult son who works during daytime hours; local daughter Giraud is primary caregiver and is very involved in management of all aspects of patient's health care needs  RNCM Clinical Goal(s):  Patient will work with the Care Management team over the next 30 days to address Transition of Care Barriers: Medication Management Provider appointments Home Health services Equipment/DME take all medications exactly as prescribed and will call provider for medication related questions as evidenced by review of same with patient and caregiver during weekly outreaches with TOC 30-day RN CM attend all scheduled medical appointments: as listed above as evidenced by review of same with patient/ caregiver and collaboration as indicated with care providers during weekly outreaches with TOC 30-day program RN CM work with home health team to participate in home health services for RN/ PT/ OT as evidenced by review of home health home visits/ collaboration as indicated with home health team during weekly outreaches with TOC 30-day program RN CM not experience hospital admission as evidenced by review of EMR. Hospital Admissions in last 6 months = 1  through collaboration with RN Care manager, provider, and care team.   Interventions: Evaluation of current treatment plan related to  self management and patient's adherence to plan as established by provider  Transitions of Care:  New goal. 06/02/23 Durable Medical Equipment (DME) needs assessed with patient/caregiver Doctor Visits  - discussed the importance of doctor visits Communication with PCP re: successful enrollment into TOC 30-day program Reviewed multiple upcoming provider office visits: confirmed patient is aware of all and has plans to attend as scheduled Full medication review  with updating medication list in EHR per patient report  Home Health PT/ RN/ OT services- confirmed active; role of home health services with importance of participation/ ongoing engagement Confirmed currently requiring/ using assistive devices for ambulation - walker prn; patient reports, "not having to use it very much so far" Provided education/ reinforcement around fall prevention Provided education around basic safe use of home O2  Discussed process to begin taking to determine options for lightweight portable home O2 delivery systems: call insurance provider to determine benefits, get questions answered/ discuss with pulmonary provider once information is obtained from insurance company Confirmed/ reinforced action plan for shortness of breath: verbalizes very good understanding/ adherence to same: currently: uses rescue inhaler/ rests- paces activity/ uses new home O2 Successfully enrolled into 30-day TOC program TOC 30-day program initial assessment completed today Provided my direct contact information should questions/ concerns/ needs arise post-TOC call, prior to next RN CM telephone visit    Patient Goals/Self-Care Activities: Participate in Transition of Care Program/Attend TOC scheduled calls Take all medications as prescribed Attend all scheduled provider appointments Call provider office for new concerns or questions  Continue pacing activity to avoid episodes of shortness of breath Continue to follow your established action plan for episodes of shortness of breath Continue using home oxygen as prescribed: do not allow anyone to smoke anywhere near your oxygen Call your insurance company to determine the benefits they provide around obtaining alternate/ lightweight portable  oxygen delivery systems and then talk to your pulmonary doctor about the options you are interested in Continue working with the home health team that is involved in your care Use assistive devices (your new  walker) as needed to prevent falls If you believe your condition is getting worse- contact your care providers (doctors) promptly- reaching out to your doctor early when you have concerns can prevent you from having to go to the hospital  Follow Up Plan:  Telephone follow up appointment with care management team member scheduled for:  Thursday 06/10/23 at 10:00 am    Plan for next week's call: Review PCP office visit scheduled for 06/08/23 Review home health visits; use of new walker Review medication adherence Reinforce/ provide education re: COPD action plan; safe use of home O2 Find out if [employer] re: lightweight portable oxygen delivery systems Progression of TOC 30-day program initial assessment        Total time spent from review to signing of note/ including any care coordination interventions:  96 minutes- creation of complex care plan/ initiation of TOC 30-day program initial assessment  Pls call/ message for questions,  Caryl Pina, RN, BSN, Media planner  Transitions of Care  [facility] - [facility] [phone]: direct office

## 2023-06-03 DIAGNOSIS — K219 Gastro-esophageal reflux disease without esophagitis: Secondary | ICD-10-CM | POA: Diagnosis not present

## 2023-06-03 DIAGNOSIS — D472 Monoclonal gammopathy: Secondary | ICD-10-CM | POA: Diagnosis not present

## 2023-06-03 DIAGNOSIS — I700 Atherosclerosis of aorta: Secondary | ICD-10-CM | POA: Diagnosis not present

## 2023-06-03 DIAGNOSIS — I447 Left bundle-branch block, unspecified: Secondary | ICD-10-CM | POA: Diagnosis not present

## 2023-06-03 DIAGNOSIS — I1310 Hypertensive heart and chronic kidney disease without heart failure, with stage 1 through stage 4 chronic kidney disease, or unspecified chronic kidney disease: Secondary | ICD-10-CM | POA: Diagnosis not present

## 2023-06-03 DIAGNOSIS — J4489 Other specified chronic obstructive pulmonary disease: Secondary | ICD-10-CM | POA: Diagnosis not present

## 2023-06-03 DIAGNOSIS — Z9181 History of falling: Secondary | ICD-10-CM | POA: Diagnosis not present

## 2023-06-03 DIAGNOSIS — Z7984 Long term (current) use of oral hypoglycemic drugs: Secondary | ICD-10-CM | POA: Diagnosis not present

## 2023-06-03 DIAGNOSIS — D630 Anemia in neoplastic disease: Secondary | ICD-10-CM | POA: Diagnosis not present

## 2023-06-03 DIAGNOSIS — E1142 Type 2 diabetes mellitus with diabetic polyneuropathy: Secondary | ICD-10-CM | POA: Diagnosis not present

## 2023-06-03 DIAGNOSIS — E1122 Type 2 diabetes mellitus with diabetic chronic kidney disease: Secondary | ICD-10-CM | POA: Diagnosis not present

## 2023-06-03 DIAGNOSIS — D631 Anemia in chronic kidney disease: Secondary | ICD-10-CM | POA: Diagnosis not present

## 2023-06-03 DIAGNOSIS — N179 Acute kidney failure, unspecified: Secondary | ICD-10-CM | POA: Diagnosis not present

## 2023-06-03 DIAGNOSIS — N184 Chronic kidney disease, stage 4 (severe): Secondary | ICD-10-CM | POA: Diagnosis not present

## 2023-06-03 DIAGNOSIS — E785 Hyperlipidemia, unspecified: Secondary | ICD-10-CM | POA: Diagnosis not present

## 2023-06-03 DIAGNOSIS — J849 Interstitial pulmonary disease, unspecified: Secondary | ICD-10-CM | POA: Diagnosis not present

## 2023-06-03 DIAGNOSIS — Z7951 Long term (current) use of inhaled steroids: Secondary | ICD-10-CM | POA: Diagnosis not present

## 2023-06-03 DIAGNOSIS — J439 Emphysema, unspecified: Secondary | ICD-10-CM | POA: Diagnosis not present

## 2023-06-03 DIAGNOSIS — E039 Hypothyroidism, unspecified: Secondary | ICD-10-CM | POA: Diagnosis not present

## 2023-06-03 DIAGNOSIS — J9601 Acute respiratory failure with hypoxia: Secondary | ICD-10-CM | POA: Diagnosis not present

## 2023-06-04 ENCOUNTER — Telehealth: Payer: Self-pay | Admitting: Internal Medicine

## 2023-06-04 DIAGNOSIS — I1310 Hypertensive heart and chronic kidney disease without heart failure, with stage 1 through stage 4 chronic kidney disease, or unspecified chronic kidney disease: Secondary | ICD-10-CM | POA: Diagnosis not present

## 2023-06-04 DIAGNOSIS — J849 Interstitial pulmonary disease, unspecified: Secondary | ICD-10-CM | POA: Diagnosis not present

## 2023-06-04 DIAGNOSIS — J439 Emphysema, unspecified: Secondary | ICD-10-CM | POA: Diagnosis not present

## 2023-06-04 DIAGNOSIS — E1142 Type 2 diabetes mellitus with diabetic polyneuropathy: Secondary | ICD-10-CM | POA: Diagnosis not present

## 2023-06-04 DIAGNOSIS — J4489 Other specified chronic obstructive pulmonary disease: Secondary | ICD-10-CM | POA: Diagnosis not present

## 2023-06-04 DIAGNOSIS — J9601 Acute respiratory failure with hypoxia: Secondary | ICD-10-CM | POA: Diagnosis not present

## 2023-06-04 NOTE — Telephone Encounter (Signed)
 Copied from CRM [id number]. Topic: Clinical - Home Health Verbal Orders >> Jun 04, 2023 12:35 PM Marica Otter wrote: Caller/Agency: [facility] Callback Number: [phone] Secure line Service Requested: Skilled Nursing, Blood sugar parameters Frequency: 1 week 9 Any new concerns about the patient? No

## 2023-06-06 ENCOUNTER — Other Ambulatory Visit: Payer: Self-pay | Admitting: Internal Medicine

## 2023-06-07 ENCOUNTER — Other Ambulatory Visit: Payer: Self-pay

## 2023-06-07 DIAGNOSIS — J849 Interstitial pulmonary disease, unspecified: Secondary | ICD-10-CM | POA: Diagnosis not present

## 2023-06-07 DIAGNOSIS — J439 Emphysema, unspecified: Secondary | ICD-10-CM | POA: Diagnosis not present

## 2023-06-07 DIAGNOSIS — I1310 Hypertensive heart and chronic kidney disease without heart failure, with stage 1 through stage 4 chronic kidney disease, or unspecified chronic kidney disease: Secondary | ICD-10-CM | POA: Diagnosis not present

## 2023-06-07 DIAGNOSIS — J9601 Acute respiratory failure with hypoxia: Secondary | ICD-10-CM | POA: Diagnosis not present

## 2023-06-07 DIAGNOSIS — E1142 Type 2 diabetes mellitus with diabetic polyneuropathy: Secondary | ICD-10-CM | POA: Diagnosis not present

## 2023-06-07 DIAGNOSIS — J4489 Other specified chronic obstructive pulmonary disease: Secondary | ICD-10-CM | POA: Diagnosis not present

## 2023-06-08 ENCOUNTER — Inpatient Hospital Stay: Admitting: Internal Medicine

## 2023-06-08 NOTE — Telephone Encounter (Signed)
 Ok for verbal, also let us know for sugars < 120 or > 200

## 2023-06-10 ENCOUNTER — Telehealth: Payer: Self-pay | Admitting: Internal Medicine

## 2023-06-10 ENCOUNTER — Other Ambulatory Visit: Payer: Self-pay | Admitting: *Deleted

## 2023-06-10 DIAGNOSIS — J4489 Other specified chronic obstructive pulmonary disease: Secondary | ICD-10-CM | POA: Diagnosis not present

## 2023-06-10 DIAGNOSIS — J439 Emphysema, unspecified: Secondary | ICD-10-CM | POA: Diagnosis not present

## 2023-06-10 DIAGNOSIS — J9601 Acute respiratory failure with hypoxia: Secondary | ICD-10-CM | POA: Diagnosis not present

## 2023-06-10 DIAGNOSIS — E1142 Type 2 diabetes mellitus with diabetic polyneuropathy: Secondary | ICD-10-CM | POA: Diagnosis not present

## 2023-06-10 DIAGNOSIS — J849 Interstitial pulmonary disease, unspecified: Secondary | ICD-10-CM | POA: Diagnosis not present

## 2023-06-10 DIAGNOSIS — I1310 Hypertensive heart and chronic kidney disease without heart failure, with stage 1 through stage 4 chronic kidney disease, or unspecified chronic kidney disease: Secondary | ICD-10-CM | POA: Diagnosis not present

## 2023-06-10 NOTE — Patient Outreach (Signed)
 Care Management  Transitions of Care Program Transitions of Care Post-discharge week 2/ day # 8   06/10/2023 Name: Nicholas Patton MRN: [id number] DOB: 08-08-1934  Subjective: GILMORE LIST is a 89 y.o. year old male who is a primary care patient of Corwin Levins, MD. The Care Management team Engaged with patient's caregiver/ daughter Banko by telephone to assess and address transitions of care needs.   Consent to Services:  Patient was given information about care management services, agreed to services, and gave verbal consent to participate. Enrolled into TOC 30-day program:  06/02/23  Assessment:  Discussed current clinical condition:  caregiver reports "he is doing well and not having any real issues; the oxygen is irritating his nose somewhat, so we are taking it off periodically when he rests; he isn't having any nosebleeds so far; it turned out that I did not have to call the insurance company re: the lightweight tanks--- we discussed it with the [employer] rep, and they gave [place] a lightweight tank that is working okay.  He is taking all of his medications and we will make sure to get to the doctor appointments next week.  He is still coughing some, but I think he is okay to wait to see his doctors next week;"    Caregiver denies specific clinical concerns today and reports patient "doing well"          SDOH Interventions    Flowsheet Row Telephone from 06/02/2023 in [facility] Clinical Support from 11/16/2022 in [facility] [place] [facility] at [facility] Clinical Support from 10/22/2021 in [facility] at [facility] Coordination from 10/14/2021 in [facility] Community Care Coordination Office Visit from 01/29/2020 in [facility] at [facility]  SDOH Interventions       Food Insecurity Interventions Intervention Not Indicated Intervention Not Indicated Intervention Not Indicated Intervention Not  Indicated --  Housing Interventions Intervention Not Indicated Intervention Not Indicated Intervention Not Indicated Intervention Not Indicated --  Transportation Interventions Intervention Not Indicated  [drives self] Intervention Not Indicated -- Intervention Not Indicated --  Utilities Interventions Intervention Not Indicated Intervention Not Indicated -- -- --  Alcohol Usage Interventions -- Intervention Not Indicated (Score <7) -- -- --  Depression Interventions/Treatment  -- -- -- -- PHQ2-9 Score <4 Follow-up Not Indicated  Financial Strain Interventions -- Intervention Not Indicated Intervention Not Indicated Intervention Not Indicated --  Physical Activity Interventions -- Patient Declined Intervention Not Indicated -- --  Stress Interventions -- Intervention Not Indicated Intervention Not Indicated -- --  Social Connections Interventions -- Intervention Not Indicated Intervention Not Indicated -- --  Health Literacy Interventions -- Intervention Not Indicated -- -- --        Goals Addressed             This Visit's Progress    TOC 30-day Program Care Plan   On track    Current Barriers:  Medication management self manages medications with  assistance as needed from adult daughter- reports essentially self-manages "most of the time;" verbalizes very good understanding of the purpose/ dosing/ scheduling of medications, as well well as changes to medications post-hospital discharge on 06/01/23 Provider appointments Hospital follow up scheduled:  06/08/23:  PCP;  06/16/23:  nephrology provider;  07/19/23: pulmonary provider- confirms on cancellation list for sooner appointment with pulmonary team Home Health services confirmed active through [facility] for RN/ PT/ OT- start of service scheduled for tomorrow 06/03/23 Equipment/DME confirmed  obtained walker prior to hospital discharge- reports not having to use much "so far"; new to home O2; confirmed obtained and using prn; reports has  concentrator and portable tanks- reports portable tanks "too heavy" for patient- provided education/ instruction to discuss with pulmonary provider: encouraged patient and his daughter to contact insurance provider prior to pulmonary office visit re: concerns with weight of current portable tanks- to obtain specific coverage/ benefits for optional light-weight portable O2 delivery systems  Independent; resides with adult son who works during daytime hours; local daughter Ohms is primary caregiver and is very involved in management of all aspects of patient's health care needs  RNCM Clinical Goal(s):  Patient will work with the Care Management team over the next 30 days to address Transition of Care Barriers: Medication Management Provider appointments Home Health services Equipment/DME take all medications exactly as prescribed and will call provider for medication related questions as evidenced by review of same with patient and caregiver during weekly outreaches with TOC 30-day RN CM attend all scheduled medical appointments: as listed above as evidenced by review of same with patient/ caregiver and collaboration as indicated with care providers during weekly outreaches with TOC 30-day program RN CM work with home health team to participate in home health services for RN/ PT/ OT as evidenced by review of home health home visits/ collaboration as indicated with home health team during weekly outreaches with TOC 30-day program RN CM not experience hospital admission as evidenced by review of EMR. Hospital Admissions in last 6 months = 1  through collaboration with RN Care manager, provider, and care team.   Interventions: Evaluation of current treatment plan related to  self management and patient's adherence to plan as established by provider  Transitions of Care:  Goal on track:  Yes. 06/10/23 Durable Medical Equipment (DME) needs assessed with patient/caregiver Doctor Visits  - discussed the  importance of doctor visits Discussed current clinical condition:  caregiver reports "he is doing well and not having any real issues; the oxygen is irritating his nose somewhat, so we are taking it off periodically when he rests; he isn't having any nosebleeds so far; it turned out that I did not have to call the insurance company re: the lightweight tanks--- we discussed it with the [employer] rep, and they gave [place] a lightweight tank that is working okay.  He is taking all of his medications and we will make sure to get to the doctor appointments next week.  He is still coughing some, but I think he is okay to wait to see his doctors next week;"  caregiver denies specific clinical concerns today Reviewed upcoming provider office visits: confirmed patient/ caregiver is aware of all and has plans to attend as scheduled 06/15/23- ENT: history of nosebleeds- encouraged to attend this appointment, given caregiver report that new home O2 is irritating patient's nasal passages 06/16/23- PCP: previous hospital follow up visit scheduled for 06/08/23 was cancelled by patient: re-scheduled for 06/16/23- strongly encouraged caregiver to make sure patient attends as scheduled Confirmed no concerns or changes around medications--- daughter reports 100% adherence to medications as per full review at time of initial TOC outreach last week  Home Health PT/ RN/ OT services- confirmed active; reinforced role of home health services with importance of participation/ ongoing engagement- daughter reports all disciplines have visited this week- reports "going fine" Confirmed continues requiring/ using assistive devices for ambulation - walker  Provided reinforcement around fall prevention Provided reinforcement around previously provided education re:  basic safe use of home O2  Confirmed/ reinforced action plan for shortness of breath: verbalizes very good understanding/ adherence to same: currently: uses rescue inhaler/ rests-  paces activity/ uses new home O2- daughter reports patient recovers "within a few minutes" when action plan is used TOC 30-day program initial assessment progression today  Patient Goals/Self-Care Activities: Participate in Transition of Care Program/Attend TOC scheduled calls Take all medications as prescribed Attend all scheduled provider appointments Call provider office for new concerns or questions  Continue pacing activity to avoid episodes of shortness of breath Continue to follow your established action plan for episodes of shortness of breath Continue using home oxygen as prescribed: do not allow anyone to smoke anywhere near your oxygen Continue working with the home health team that is involved in your care Use assistive devices (your new walker) as needed to prevent falls If you believe your condition is getting worse- contact your care providers (doctors) promptly- reaching out to your doctor early when you have concerns can prevent you from having to go to the hospital  Follow Up Plan:  Telephone follow up appointment with care management team member scheduled for:  Friday 06/18/23 at 10:00 am    Plan for next week's call: Review PCP office visit scheduled for 06/16/23; ENT visit 06/15/23 Review home health visits; use of new walker Review medication adherence Reinforce/ provide education re: COPD action plan; safe use of home O2 Progression of TOC 30-day program initial assessment       Plan: Telephone follow up appointment with care management team member scheduled for:   Friday 06/18/23 at 10:00 am  Total time spent from review to signing of note/ including any care coordination interventions: 47 minutes  Pls call/ message for questions,  Caryl Pina, RN, BSN, CCRN Alumnus RN Care Manager  Transitions of Care  [facility] - [facility] [phone]: direct office

## 2023-06-10 NOTE — Telephone Encounter (Signed)
 Called and gave verbals.

## 2023-06-10 NOTE — Telephone Encounter (Signed)
 This is noted, ok to continue all current med tx

## 2023-06-10 NOTE — Patient Instructions (Signed)
 Visit Information  Thank you for taking time to visit with me today. Please don't hesitate to contact me if I can be of assistance to you before our next scheduled telephone appointment.  Our next appointment is by telephone on Friday 06/18/23 at 10:00 am  Please call the care guide team at [phone] if you need to cancel or reschedule your appointment.   Following are the goals we discussed today:  Patient Goals/Self-Care Activities: Participate in Transition of Care Program/Attend TOC scheduled calls Take all medications as prescribed Attend all scheduled provider appointments Call provider office for new concerns or questions  Continue pacing activity to avoid episodes of shortness of breath Continue to follow your established action plan for episodes of shortness of breath Continue using home oxygen as prescribed: do not allow anyone to smoke anywhere near your oxygen Continue working with the home health team that is involved in your care Use assistive devices (your new walker) as needed to prevent falls If you believe your condition is getting worse- contact your care providers (doctors) promptly- reaching out to your doctor early when you have concerns can prevent you from having to go to the hospital  If you are experiencing a Mental Health or Behavioral Health Crisis or need someone to talk to, please  call the Suicide and Crisis Lifeline: 988 call the [place] National Suicide Prevention Lifeline: (806) 744-2622 or TTY: (713)192-9412 TTY (863)631-3918) to talk to a trained counselor call 1-800-273-TALK (toll free, 24 hour hotline) go to [facility] [address], [place] [phone]) call the [facility] Crisis Line: [phone] call 911   Patient verbalizes understanding of instructions and care plan provided today and agrees to view in MyChart. Active MyChart status and patient understanding of how to access instructions and care  plan via MyChart confirmed with patient.     Caryl Pina, RN, BSN, Media planner  Transitions of Care  [facility] - [facility] [phone]: direct office

## 2023-06-10 NOTE — Telephone Encounter (Signed)
 Copied from CRM [id number]. Topic: Clinical - Prescription Issue >> Jun 10, 2023 12:22 PM Sonny Dandy B wrote: Reason for CRM: Devon, calling from [facility] called to advise. Of a class 2 interaction between medication.albuterol (VENTOLIN HFA) 108 (90 Base) MCG/ACT inhaler  And carvedilol (COREG) 12.5 MG tablet, 2nd interaction BREO ELLIPTA 200-25 MCG/ACT AEPB states it's interacting with Coreg . This is just to inform the provider

## 2023-06-10 NOTE — Telephone Encounter (Signed)
 Copied from CRM [id number]. Topic: General - Other >> Jun 10, 2023 12:15 PM [place] E wrote: Reason for CRM: Devon with [facility] calling to update PCP that patient's blood sugar from today was 108. Patient's parameters for blood sugar 120-200, and Devon is questioning if PCP can adjust patient's lower parameter as it will always be lower than 120. Callback number for [place] is [phone] with any questions.

## 2023-06-11 DIAGNOSIS — J439 Emphysema, unspecified: Secondary | ICD-10-CM | POA: Diagnosis not present

## 2023-06-11 DIAGNOSIS — I1310 Hypertensive heart and chronic kidney disease without heart failure, with stage 1 through stage 4 chronic kidney disease, or unspecified chronic kidney disease: Secondary | ICD-10-CM | POA: Diagnosis not present

## 2023-06-11 DIAGNOSIS — J4489 Other specified chronic obstructive pulmonary disease: Secondary | ICD-10-CM | POA: Diagnosis not present

## 2023-06-11 DIAGNOSIS — J849 Interstitial pulmonary disease, unspecified: Secondary | ICD-10-CM | POA: Diagnosis not present

## 2023-06-11 DIAGNOSIS — E1142 Type 2 diabetes mellitus with diabetic polyneuropathy: Secondary | ICD-10-CM | POA: Diagnosis not present

## 2023-06-11 DIAGNOSIS — J9601 Acute respiratory failure with hypoxia: Secondary | ICD-10-CM | POA: Diagnosis not present

## 2023-06-15 ENCOUNTER — Ambulatory Visit (INDEPENDENT_AMBULATORY_CARE_PROVIDER_SITE_OTHER)

## 2023-06-15 DIAGNOSIS — J439 Emphysema, unspecified: Secondary | ICD-10-CM | POA: Diagnosis not present

## 2023-06-15 DIAGNOSIS — J9601 Acute respiratory failure with hypoxia: Secondary | ICD-10-CM | POA: Diagnosis not present

## 2023-06-15 DIAGNOSIS — I1310 Hypertensive heart and chronic kidney disease without heart failure, with stage 1 through stage 4 chronic kidney disease, or unspecified chronic kidney disease: Secondary | ICD-10-CM | POA: Diagnosis not present

## 2023-06-15 DIAGNOSIS — J849 Interstitial pulmonary disease, unspecified: Secondary | ICD-10-CM | POA: Diagnosis not present

## 2023-06-15 DIAGNOSIS — E1142 Type 2 diabetes mellitus with diabetic polyneuropathy: Secondary | ICD-10-CM | POA: Diagnosis not present

## 2023-06-15 DIAGNOSIS — J4489 Other specified chronic obstructive pulmonary disease: Secondary | ICD-10-CM | POA: Diagnosis not present

## 2023-06-16 ENCOUNTER — Ambulatory Visit: Admitting: Internal Medicine

## 2023-06-16 ENCOUNTER — Encounter: Payer: Self-pay | Admitting: Internal Medicine

## 2023-06-16 VITALS — BP 124/62 | HR 76 | Temp 98.2°F | Ht 74.0 in | Wt 187.0 lb

## 2023-06-16 DIAGNOSIS — J9611 Chronic respiratory failure with hypoxia: Secondary | ICD-10-CM | POA: Diagnosis not present

## 2023-06-16 DIAGNOSIS — Z7984 Long term (current) use of oral hypoglycemic drugs: Secondary | ICD-10-CM

## 2023-06-16 DIAGNOSIS — J439 Emphysema, unspecified: Secondary | ICD-10-CM | POA: Diagnosis not present

## 2023-06-16 DIAGNOSIS — E1140 Type 2 diabetes mellitus with diabetic neuropathy, unspecified: Secondary | ICD-10-CM | POA: Diagnosis not present

## 2023-06-16 DIAGNOSIS — N2581 Secondary hyperparathyroidism of renal origin: Secondary | ICD-10-CM | POA: Diagnosis not present

## 2023-06-16 DIAGNOSIS — N189 Chronic kidney disease, unspecified: Secondary | ICD-10-CM | POA: Diagnosis not present

## 2023-06-16 DIAGNOSIS — D631 Anemia in chronic kidney disease: Secondary | ICD-10-CM | POA: Diagnosis not present

## 2023-06-16 DIAGNOSIS — N184 Chronic kidney disease, stage 4 (severe): Secondary | ICD-10-CM | POA: Diagnosis not present

## 2023-06-16 DIAGNOSIS — R600 Localized edema: Secondary | ICD-10-CM | POA: Diagnosis not present

## 2023-06-16 DIAGNOSIS — I129 Hypertensive chronic kidney disease with stage 1 through stage 4 chronic kidney disease, or unspecified chronic kidney disease: Secondary | ICD-10-CM | POA: Diagnosis not present

## 2023-06-16 MED ORDER — HYDROCODONE BIT-HOMATROP MBR 5-1.5 MG/5ML PO SOLN: 5.0000 mL | Freq: Four times a day (QID) | ORAL | 0 refills | Status: AC | PRN

## 2023-06-16 NOTE — Patient Instructions (Addendum)
 Please take all new medication as prescribed - the cough medicine  Please continue all other medications as before, and refills have been done if requested.  Please have the pharmacy call with any other refills you may need.  Please keep your appointments with your specialists as you may have planned - Pulmonary in May  Please make an Appointment to return in 6 months, or sooner if needed    .

## 2023-06-16 NOTE — Progress Notes (Signed)
 Patient ID: Nicholas Patton, male   DOB: [birth year], 89 y.o.   MRN: [id number]        Chief Complaint: follow up post hospn 3/23 - 3/25 025 with acute hypoxic resp failure, copd severe, transient AKI, dm, htn       HPI:  Nicholas Patton is a 89 y.o. male here with family overall doing well now 1 wk post d/c - Pt denies chest pain, increased sob or doe, wheezing, orthopnea, PND, increased LE swelling, palpitations, dizziness or syncope.   Pt denies fever, wt loss, night sweats, loss of appetite, or other constitutional symptoms   Pt denies fever, wt loss, night sweats, loss of appetite, or other constitutional symptoms  Does have a mild persistent cough, and is d/c home with new Home O2 2L  RN and PT already arrived to visit, both to continue.  No new complaints       Wt Readings from Last 3 Encounters:  06/16/23 187 lb (84.8 kg)  06/01/23 188 lb 15 oz (85.7 kg)  05/10/23 193 lb (87.5 kg)   BP Readings from Last 3 Encounters:  06/16/23 124/62  06/01/23 (!) 141/93  05/10/23 (!) 128/55         Past Medical History:  Diagnosis Date   Anemia    Arthritis    Cataract    REMOVED BILATERAL   Colon polyps    COPD (chronic obstructive pulmonary disease) (HCC)    DIABETES MELLITUS, TYPE II    takes Metformin and Glimepiride daily   Diverticulosis    GERD (gastroesophageal reflux disease)    takes Protonix daily   History of colon polyps    HYPERLIPIDEMIA    takes Atorvastatin daily   HYPERTENSION    takes Amlodipine and Hyzaar daily   Ileus following gastrointestinal surgery (HCC) 07/22/2012   Joint pain    Peripheral neuropathy    Peripheral neuropathy    PERIPHERAL NEUROPATHY, FEET 10/06/2007   Renal disorder    VITAMIN B12 DEFICIENCY 09/06/2008   VITAMIN D DEFICIENCY 01/16/2010   takes Vitamin D daily   Past Surgical History:  Procedure Laterality Date   CATARACT EXTRACTION     both eyes   CHOLECYSTECTOMY N/A 07/18/2012   Procedure: LAPAROSCOPIC CHOLECYSTECTOMY WITH  INTRAOPERATIVE CHOLANGIOGRAM;  Surgeon: Cloyce Darby, MD;  Location: [facility] OR;  Service: General;  Laterality: N/A;   COLONOSCOPY     EYE SURGERY Right 01/2023   POLYPECTOMY     POSTERIOR CERVICAL FUSION/FORAMINOTOMY N/A 06/27/2015   Procedure: Posterior Cervical Fusion with lateral mass fixation Cervical four to cervical seven;  Surgeon: Isadora Mar, MD;  Location: [facility] NEURO ORS;  Service: Neurosurgery;  Laterality: N/A;   TOTAL KNEE ARTHROPLASTY  2001   Left   TURP VAPORIZATION      reports that he quit smoking about 39 years ago. His smoking use included cigarettes. He started smoking about 70 years ago. He has a 31 pack-year smoking history. He has never been exposed to tobacco smoke. He has never used smokeless tobacco. He reports that he does not drink alcohol and does not use drugs. family history includes Diabetes Mellitus II in his mother; Lung cancer in his brother. Allergies  Allergen Reactions   Amlodipine Swelling   Cymbalta [Duloxetine Hcl] Other (See Comments)   Oxycodone Other (See Comments)   Current Outpatient Medications on File Prior to Visit  Medication Sig Dispense Refill   albuterol (VENTOLIN HFA) 108 (90 Base) MCG/ACT inhaler INHALE 2 PUFFS BY  MOUTH EVERY 6 HOURS AS NEEDED FOR WHEEZING FOR SHORTNESS OF BREATH 18 g 2   allopurinol (ZYLOPRIM) 100 MG tablet Take 1 tablet (100 mg total) by mouth daily. 90 tablet 1   atorvastatin (LIPITOR) 40 MG tablet Take 1 tablet (40 mg total) by mouth daily. 90 tablet 3   BREO ELLIPTA 200-25 MCG/ACT AEPB USE 1 INHALATION BY MOUTH ONCE  DAILY AT THE SAME TIME EACH DAY 180 each 3   carvedilol (COREG) 12.5 MG tablet TAKE 1 TABLET BY MOUTH TWICE  DAILY WITH MEALS 180 tablet 3   cholecalciferol (VITAMIN D3) 25 MCG (1000 UNIT) tablet Take 1,000 Units by mouth daily. 06/02/23: Reports during TOC call "taking OTC 2,000 Units every day"     doxazosin (CARDURA) 2 MG tablet Take 1 tablet (2 mg total) by mouth daily. 90 tablet 3   finasteride  (PROSCAR) 5 MG tablet TAKE 1 TABLET BY MOUTH DAILY 90 tablet 3   glimepiride (AMARYL) 2 MG tablet TAKE 2 TABLETS BY MOUTH DAILY  BEFORE BREAKFAST 180 tablet 3   levothyroxine (SYNTHROID) 50 MCG tablet Take 1 tablet (50 mcg total) by mouth daily. 90 tablet 3   losartan (COZAAR) 100 MG tablet TAKE 1 TABLET BY MOUTH DAILY 90 tablet 3   pantoprazole (PROTONIX) 40 MG tablet Take 1 tablet (40 mg total) by mouth daily. 90 tablet 3   pioglitazone (ACTOS) 30 MG tablet Take 30 mg by mouth daily.     vitamin B-12 (CYANOCOBALAMIN) 100 MCG tablet Take 100 mcg by mouth daily.     furosemide (LASIX) 40 MG tablet Take 1 tablet (40 mg total) by mouth daily. 90 tablet 3   No current facility-administered medications on file prior to visit.        ROS:  All others reviewed and negative.  Objective        PE:  BP 124/62 (BP Location: Right Arm, Patient Position: Sitting, Cuff Size: Normal)   Pulse 76   Temp 98.2 F (36.8 C) (Oral)   Ht 6\' 2"  (1.88 m)   Wt 187 lb (84.8 kg)   SpO2 (!) 87% Comment: Fingers cold  BMI 24.01 kg/m                 Constitutional: Pt appears in NAD               HENT: Head: NCAT.                Right Ear: External ear normal.                 Left Ear: External ear normal.                Eyes: . Pupils are equal, round, and reactive to light. Conjunctivae and EOM are normal               Nose: without d/c or deformity               Neck: Neck supple. Gross normal ROM               Cardiovascular: Normal rate and regular rhythm.                 Pulmonary/Chest: Effort normal and breath sounds without rales or wheezing.                Abd:  Soft, NT, ND, + BS, no organomegaly  Neurological: Pt is alert. At baseline orientation, motor grossly intact               Skin: Skin is warm. No rashes, no other new lesions, LE edema - none               Psychiatric: Pt behavior is normal without agitation   Micro: none  Cardiac tracings I have personally interpreted  today:  none  Pertinent Radiological findings (summarize): none   Lab Results  Component Value Date   WBC 5.2 06/01/2023   HGB 9.2 (L) 06/01/2023   HCT 28.5 (L) 06/01/2023   PLT 202 06/01/2023   GLUCOSE 193 (H) 06/01/2023   CHOL 131 04/23/2023   TRIG 65.0 04/23/2023   HDL 49.70 04/23/2023   LDLCALC 69 04/23/2023   ALT 22 05/31/2023   AST 21 05/31/2023   NA 137 06/01/2023   K 3.9 06/01/2023   CL 108 06/01/2023   CREATININE 2.79 (H) 06/01/2023   BUN 65 (H) 06/01/2023   CO2 21 (L) 06/01/2023   TSH 5.44 04/23/2023   PSA 4.61 (H) 04/09/2017   INR 1.12 06/19/2015   HGBA1C 7.4 (H) 04/23/2023   MICROALBUR 1.7 04/23/2023   Assessment/Plan:  Nicholas Patton is a 89 y.o. Black or African American [2] male with  has a past medical history of Anemia, Arthritis, Cataract, Colon polyps, COPD (chronic obstructive pulmonary disease) (HCC), DIABETES MELLITUS, TYPE II, Diverticulosis, GERD (gastroesophageal reflux disease), History of colon polyps, HYPERLIPIDEMIA, HYPERTENSION, Ileus following gastrointestinal surgery (HCC) (07/22/2012), Joint pain, Peripheral neuropathy, Peripheral neuropathy, PERIPHERAL NEUROPATHY, FEET (10/06/2007), Renal disorder, VITAMIN B12 DEFICIENCY (09/06/2008), and VITAMIN D DEFICIENCY (01/16/2010).  Chronic hypoxemic respiratory failure (HCC) Pt to continue home o2 2L  COPD with emphysema (HCC) Severe, stable, for pulm f/u as planned  Diabetes Lab Results  Component Value Date   HGBA1C 7.4 (H) 04/23/2023   Stable for age, pt to continue current medical treatment glimeparide 2 mg every day, actos 30 mg qd  Followup: Return in about 6 months (around 12/16/2023).  Rosalia Colonel, MD 06/19/2023 5:10 PM [facility]

## 2023-06-17 DIAGNOSIS — E1142 Type 2 diabetes mellitus with diabetic polyneuropathy: Secondary | ICD-10-CM | POA: Diagnosis not present

## 2023-06-17 DIAGNOSIS — J9601 Acute respiratory failure with hypoxia: Secondary | ICD-10-CM | POA: Diagnosis not present

## 2023-06-17 DIAGNOSIS — J439 Emphysema, unspecified: Secondary | ICD-10-CM | POA: Diagnosis not present

## 2023-06-17 DIAGNOSIS — J4489 Other specified chronic obstructive pulmonary disease: Secondary | ICD-10-CM | POA: Diagnosis not present

## 2023-06-17 DIAGNOSIS — J849 Interstitial pulmonary disease, unspecified: Secondary | ICD-10-CM | POA: Diagnosis not present

## 2023-06-17 DIAGNOSIS — I1310 Hypertensive heart and chronic kidney disease without heart failure, with stage 1 through stage 4 chronic kidney disease, or unspecified chronic kidney disease: Secondary | ICD-10-CM | POA: Diagnosis not present

## 2023-06-18 ENCOUNTER — Other Ambulatory Visit: Payer: Self-pay | Admitting: *Deleted

## 2023-06-18 NOTE — Patient Instructions (Signed)
 Visit Information  Thank you for taking time to visit with me today. Please don't hesitate to contact me if I can be of assistance to you before our next scheduled telephone appointment.  Our next appointment is by telephone on Thursday 06/24/23 at 10:00 am  Please call the care guide team at [phone] if you need to cancel or reschedule your appointment.  Following are the goals we discussed today:  Patient Goals/Self-Care Activities: Participate in Transition of Care Program/Attend TOC scheduled calls Take all medications as prescribed Attend all scheduled provider appointments Call provider office for new concerns or questions  Continue pacing activity to avoid episodes of shortness of breath Continue to follow your established action plan for episodes of shortness of breath Continue using home oxygen as prescribed: do not allow anyone to smoke anywhere near your oxygen Continue working with the home health team that is involved in your care Use assistive devices (your new walker) as needed to prevent falls If you believe your condition is getting worse- contact your care providers (doctors) promptly- reaching out to your doctor early when you have concerns can prevent you from having to go to the hospital  If you are experiencing a Mental Health or Behavioral Health Crisis or need someone to talk to, please  call the Suicide and Crisis Lifeline: 988 call the [place] National Suicide Prevention Lifeline: 347-361-1270 or TTY: (908) 311-9443 TTY 819-173-9579) to talk to a trained counselor call 1-800-273-TALK (toll free, 24 hour hotline) go to [facility] [address] [place] [phone]) call the [facility] Crisis Line: [phone] call 911   Caregiver verbalizes understanding of instructions and care plan provided today and agrees to view in MyChart. Active MyChart status and patient understanding of how to access instructions and  care plan via MyChart confirmed with patient.     Caryl Pina, RN, BSN, Media planner  Transitions of Care  [facility] - [facility] [phone]: direct office

## 2023-06-18 NOTE — Transitions of Care (Post Inpatient/ED Visit) (Signed)
 Transition of Care week 3/ day # 16  Visit Note  06/18/2023  Name: Nicholas Patton MRN: [id number]          DOB: [birth year]  Situation: Patient enrolled in [facility] 30-day program. Visit completed with patient and daughter/ caregiver by telephone.   HIPAA identifiers x 2 verified  Background:  Recent hospitalization March 23-25, 2025 for acute respiratory failure with hypoxia: cough/ shortness of breath; fatigue- new initiation of home O2  Initial Transition Care Management Follow-up Telephone Call    Past Medical History:  Diagnosis Date   Anemia    Arthritis    Cataract    REMOVED BILATERAL   Colon polyps    COPD (chronic obstructive pulmonary disease) (HCC)    DIABETES MELLITUS, TYPE II    takes Metformin and Glimepiride daily   Diverticulosis    GERD (gastroesophageal reflux disease)    takes Protonix daily   History of colon polyps    HYPERLIPIDEMIA    takes Atorvastatin daily   HYPERTENSION    takes Amlodipine and Hyzaar daily   Ileus following gastrointestinal surgery (HCC) 07/22/2012   Joint pain    Peripheral neuropathy    Peripheral neuropathy    PERIPHERAL NEUROPATHY, FEET 10/06/2007   Renal disorder    VITAMIN B12 DEFICIENCY 09/06/2008   VITAMIN D DEFICIENCY 01/16/2010   takes Vitamin D daily   Assessment:  patient and caregiver reports "he is still doing really good and not having any problems- except for the ongoing cough; Dr. Jonny Ruiz gave him some cough medicine, we'll see if it works.  He also saw the nephrology provider on 06/16/23- got a good report there- they just added OTC iron, which he is now taking.  Using the home O2 without problem- his oxygen levels are staying really good at home; home health still coming, have been here twice this week.  His nose is less irritated from the home oxygen because we started using a humidifier."    Patient and caregiver both deny clinical concerns and patient sounds to be in no distress throughout TOC 30-day program  outreach call today   Patient Reported Symptoms:  Cognitive Alert and oriented to person, place, and time, Insightful and able to interpret abstract concepts, Normal speech and language skills  Neurological No symptoms reported, Not assessed (not indicated)    HEENT Nosebleed (history of nosebleeds: confirmed no recent noselbleeds- in setting of new home O2)    Cardiovascular No symptoms reported, Not assessed (not indicated)    Respiratory Shortness of breath, Dry cough    Endocrine No symptoms reported, Not assessed (not indicated)    Gastrointestinal No symptoms reported, Not assessed (not indicated)    Genitourinary No symptoms reported, Not assessed (not indicated)    Integumentary No symptoms reported, Not assessed (not indicated)    Musculoskeletal Unsteady gait, Difficulty walking    Psychosocial No symptoms reported, Not assessed (not indicated)     Vitals:   06/18/23 1000  SpO2: 94%    Medications Reviewed Today     Reviewed by Michaela Corner, RN (Registered Nurse) on 06/18/23 at 1016  Med List Status: <None>   Medication Order Taking? Sig Documenting Provider Last Dose Status Informant  albuterol (VENTOLIN HFA) 108 (90 Base) MCG/ACT inhaler [id number]  INHALE 2 PUFFS BY MOUTH EVERY 6 HOURS AS NEEDED FOR WHEEZING FOR SHORTNESS OF BREATH Hunsucker, Lesia Sago, MD  Active Child, Self, Pharmacy Records  allopurinol (ZYLOPRIM) 100 MG tablet [id number]  Take 1 tablet (100  mg total) by mouth daily. Rodolph Bong, MD  Active Child, Self, Pharmacy Records  atorvastatin (LIPITOR) 40 MG tablet [id number]  Take 1 tablet (40 mg total) by mouth daily. Corwin Levins, MD  Active Child, Self, Pharmacy Records  BREO ELLIPTA 200-25 MCG/ACT AEPB [id number]  USE 1 INHALATION BY MOUTH ONCE  DAILY AT THE SAME TIME EACH DAY Hunsucker, Lesia Sago, MD  Active Child, Self, Pharmacy Records  carvedilol (COREG) 12.5 MG tablet [id number]  TAKE 1 TABLET BY MOUTH TWICE  DAILY WITH MEALS Corwin Levins,  MD  Active   cholecalciferol (VITAMIN D3) 25 MCG (1000 UNIT) tablet [id number]  Take 1,000 Units by mouth daily. 06/02/23: Reports during TOC call "taking OTC 2,000 Units every day" Corwin Levins, MD  Active Self           Med Note Otelia Limes Jun 02, 2023  4:04 PM) 06/02/23: Reports during TOC call "taking OTC 2,000 Units every day"   doxazosin (CARDURA) 2 MG tablet [id number]  Take 1 tablet (2 mg total) by mouth daily. Corwin Levins, MD  Active Child, Self, Pharmacy Records  ferrous sulfate 325 (65 FE) MG EC tablet [id number] Yes Take 325 mg by mouth 3 (three) times daily with meals. 06/18/23: Daughter reports nephrology provider prescribed iron 325 mg OTC at time of office visit on 06/16/23 Corwin Levins, MD Taking Active Child           Med Note Michaela Corner   Fri Jun 18, 2023 10:16 AM) 06/18/23: Daughter reports nephrology provider prescribed iron 325 mg OTC at time of office visit on 06/16/23   finasteride (PROSCAR) 5 MG tablet [id number]  TAKE 1 TABLET BY MOUTH DAILY Bjorn Pippin, MD  Active Child, Self, Pharmacy Records  furosemide (LASIX) 40 MG tablet [id number]  Take 1 tablet (40 mg total) by mouth daily. Maisie Fus, MD  Expired 05/23/23 2359            Med Note Michaela Corner   Wed Jun 02, 2023  4:01 PM) 06/02/23: reports during [facility] call is taking as prescribed  glimepiride (AMARYL) 2 MG tablet [id number]  TAKE 2 TABLETS BY MOUTH DAILY  BEFORE BREAKFAST Corwin Levins, MD  Active Child, Self, Pharmacy Records  HYDROcodone bit-homatropine [facility]) 5-1.5 MG/5ML syrup [id number] Yes Take 5 mLs by mouth every 6 (six) hours as needed for up to 10 days. Corwin Levins, MD Taking Active   levothyroxine (SYNTHROID) 50 MCG tablet [id number]  Take 1 tablet (50 mcg total) by mouth daily. Corwin Levins, MD  Active Child, Self, Pharmacy Records  losartan (COZAAR) 100 MG tablet [id number]  TAKE 1 TABLET BY MOUTH DAILY Corwin Levins, MD  Active Child, Self, Pharmacy Records  pantoprazole (PROTONIX)  40 MG tablet [id number]  Take 1 tablet (40 mg total) by mouth daily. Corwin Levins, MD  Active Child, Self, Pharmacy Records  pioglitazone (ACTOS) 30 MG tablet [id number]  Take 30 mg by mouth daily. [provider]  Active Child, Self, Pharmacy Records  vitamin B-12 (CYANOCOBALAMIN) 100 MCG tablet [id number]  Take 100 mcg by mouth daily. [provider]  Active Child, Self, Pharmacy Records           Recommendation:   Specialty provider follow-up Pulmonary provider- as scheduled 07/19/23 Continue to follow established plan of care for [facility] 30-day program/ COPD self-health management- action plan for shortness of breath  Plan for next week's call: Review  ENT visit 06/15/23- determine if patient attended; ongoing toleration of home O2 in setting of history of nosebleeds Update on status of chronic cough post- initiation of home O2 Review home health visits; use of new walker Review medication adherence Reinforce/ provide education re: COPD action plan; safe use of home O2: SaO2 levels at home  Follow Up Plan:   Telephone follow-up in 1 week- as scheduled 06/24/23  Total time spent from review to signing of note/ including any care coordination interventions:  48 minutes  Pls call/ message for questions,  Caryl Pina, RN, BSN, CCRN Alumnus RN Care Manager  Transitions of Care  [facility] - [facility] [phone]: direct office

## 2023-06-19 ENCOUNTER — Encounter: Payer: Self-pay | Admitting: Internal Medicine

## 2023-06-19 DIAGNOSIS — J9611 Chronic respiratory failure with hypoxia: Secondary | ICD-10-CM | POA: Insufficient documentation

## 2023-06-19 NOTE — Assessment & Plan Note (Signed)
 Severe, stable, for pulm f/u as planned

## 2023-06-19 NOTE — Assessment & Plan Note (Signed)
 Lab Results  Component Value Date   HGBA1C 7.4 (H) 04/23/2023   Stable for age, pt to continue current medical treatment glimeparide 2 mg every day, actos 30 mg qd

## 2023-06-19 NOTE — Assessment & Plan Note (Signed)
 Pt to continue home o2 2L

## 2023-06-24 ENCOUNTER — Other Ambulatory Visit: Payer: Self-pay | Admitting: *Deleted

## 2023-06-24 ENCOUNTER — Telehealth: Payer: Self-pay | Admitting: Internal Medicine

## 2023-06-24 DIAGNOSIS — J439 Emphysema, unspecified: Secondary | ICD-10-CM | POA: Diagnosis not present

## 2023-06-24 DIAGNOSIS — I1310 Hypertensive heart and chronic kidney disease without heart failure, with stage 1 through stage 4 chronic kidney disease, or unspecified chronic kidney disease: Secondary | ICD-10-CM | POA: Diagnosis not present

## 2023-06-24 DIAGNOSIS — J849 Interstitial pulmonary disease, unspecified: Secondary | ICD-10-CM | POA: Diagnosis not present

## 2023-06-24 DIAGNOSIS — E1142 Type 2 diabetes mellitus with diabetic polyneuropathy: Secondary | ICD-10-CM | POA: Diagnosis not present

## 2023-06-24 DIAGNOSIS — J9611 Chronic respiratory failure with hypoxia: Secondary | ICD-10-CM

## 2023-06-24 DIAGNOSIS — J4489 Other specified chronic obstructive pulmonary disease: Secondary | ICD-10-CM | POA: Diagnosis not present

## 2023-06-24 DIAGNOSIS — J9601 Acute respiratory failure with hypoxia: Secondary | ICD-10-CM | POA: Diagnosis not present

## 2023-06-24 NOTE — Transitions of Care (Post Inpatient/ED Visit) (Signed)
 Transition of Care week 4/ day # 22  Visit Note  06/24/2023  Name: Nicholas Patton MRN: [id number]          DOB: 06/21/1934  Situation: Patient enrolled in [facility] 30-day program. Visit completed with patient and caregiver by telephone.   HIPAA identifiers x 2 verified  Background:  Recent hospitalization March 23-25, 2025 for acute respiratory failure with hypoxia: cough/ shortness of breath; fatigue- new initiation of home O2  Transition Care Management Follow-up Telephone Call    Past Medical History:  Diagnosis Date   Anemia    Arthritis    Cataract    REMOVED BILATERAL   Colon polyps    COPD (chronic obstructive pulmonary disease) (HCC)    DIABETES MELLITUS, TYPE II    takes Metformin and Glimepiride daily   Diverticulosis    GERD (gastroesophageal reflux disease)    takes Protonix daily   History of colon polyps    HYPERLIPIDEMIA    takes Atorvastatin daily   HYPERTENSION    takes Amlodipine and Hyzaar daily   Ileus following gastrointestinal surgery (HCC) 07/22/2012   Joint pain    Peripheral neuropathy    Peripheral neuropathy    PERIPHERAL NEUROPATHY, FEET 10/06/2007   Renal disorder    VITAMIN B12 DEFICIENCY 09/06/2008   VITAMIN D DEFICIENCY 01/16/2010   takes Vitamin D daily   Assessment:  patient and caregiver reports "he is still doing great- not having any problems-  the ongoing cough is much better since Dr. Autry Legions gave him the cough medicine, we are only using it about every other day or so.  He is using both the home O2 and the walker less and less; his oxygen levels are staying up in the 90's so we are only using it mainly at night; he is steady walking on his feet, and not using the walker except occasionally; we expect home health PT last visit to be today.  No nosebleeds, he is eating and peeing and pooping normally"    Patient and caregiver both deny clinical concerns and patient sounds to be in no distress throughout [facility] 30-day program outreach call  today  Patient Reported Symptoms: Cognitive Cognitive Status: Alert and oriented to person, place, and time, Insightful and able to interpret abstract concepts, Normal speech and language skills Cognitive/Intellectual Conditions Management [RPT]: None reported or documented in medical history or problem list   Health Maintenance Behaviors: Annual physical exam, Healthy diet, Sleep adequate  Neurological      HEENT HEENT Symptoms Reported: Nosebleed (history of nosebleeds- confirmed no recent nose bleeds; caregiver reports they did not attend ENT routine office visit on 06/15/23- reports to re-schedule as indicated) HEENT Management Strategies: Routine screening, Medical device (humidifier use "alll the time")    Cardiovascular Cardiovascular Symptoms Reported: No symptoms reported Does patient have uncontrolled Hypertension?: Yes Is patient checking Blood Pressure at home?: No Cardiovascular Conditions: Hypertension Cardiovascular Management Strategies: Medication therapy, Diet modification, Routine screening  Respiratory Respiratory Symptoms Reported: Dry cough, Shortness of breath (Reports baseline shortness of breath and chronic cough "better;" denies both today; reports now using home O2 only qHS and prn as indicated) Respiratory Conditions: COPD, Shortness of breath Respiratory Management Strategies: oxygen therapy, medication therapy, adequate rest, routine screening Oxygen Therapy Device: nasal cannula Oxygen Therapy Times: as needed Oxygen Flow (L/min): 2 L/min  Endocrine Patient reports the following symptoms related to hypoglycemia or hyperglycemia : No symptoms reported, Not assessed (not indicated)    Gastrointestinal Gastrointestinal Symptoms Reported: No  symptoms reported Additional Gastrointestinal Details: Reports eating good; appetitie "good;" reports "normal" BM's, denies issues around consitpation Gastrointestinal Management Strategies: Adequate rest, Diet modification     Genitourinary      Integumentary Integumentary Symptoms Reported: No symptoms reported, Not assessed (not indicated)    Musculoskeletal Musculoskelatal Symptoms Reviewed: No symptoms reported Additional Musculoskeletal Details: confirmed not needing to use assistive devices regularly- now using only prn; confirmed home health PT remains active- caregiver reports "expects today will be last visit" Musculoskeletal Conditions: Mobility limited Musculoskeletal Management Strategies: Medical device, Coping strategies, Routine screening Falls in the past year?: Yes Number of falls in past year: 2 or more Was there an injury with Fall?: No Fall Risk Category Calculator: 2 Patient Fall Risk Level: Moderate Fall Risk Patient at Risk for Falls Due to: Impaired mobility, History of fall(s), Medication side effect Fall risk Follow up: Falls prevention discussed, Education provided  Psychosocial Psychosocial Symptoms Reported: No symptoms reported, Not assessed (not indicated)         Vitals:   06/24/23 1018  SpO2: 94%    Medications Reviewed Today     Reviewed by Quinto Tippy M, RN (Registered Nurse) on 06/24/23 at [phone]  Med List Status: <None>   Medication Order Taking? Sig Documenting Provider Last Dose Status Informant  albuterol (VENTOLIN HFA) 108 (90 Base) MCG/ACT inhaler [id number] No INHALE 2 PUFFS BY MOUTH EVERY 6 HOURS AS NEEDED FOR WHEEZING FOR SHORTNESS OF BREATH Hunsucker, Archer Kobs, MD Taking Active Child, Self, Pharmacy Records  allopurinol (ZYLOPRIM) 100 MG tablet [id number] No Take 1 tablet (100 mg total) by mouth daily. Syliva Even, MD Taking Active Child, Self, Pharmacy Records  atorvastatin (LIPITOR) 40 MG tablet [id number] No Take 1 tablet (40 mg total) by mouth daily. Roslyn Coombe, MD Taking Active Child, Self, Pharmacy Records  [facility] 200-25 MCG/ACT AEPB [id number] No USE 1 INHALATION BY MOUTH ONCE  DAILY AT THE SAME TIME EACH DAY Hunsucker, Archer Kobs, MD Taking  Active Child, Self, Pharmacy Records  carvedilol (COREG) 12.5 MG tablet [id number] No TAKE 1 TABLET BY MOUTH TWICE  DAILY WITH MEALS Roslyn Coombe, MD Taking Active   cholecalciferol (VITAMIN D3) 25 MCG (1000 UNIT) tablet [id number] No Take 1,000 Units by mouth daily. 06/02/23: Reports during TOC call "taking OTC 2,000 Units every day" Roslyn Coombe, MD Taking Active Self           Med Note (Elsy Chiang M   Wed Jun 02, 2023  4:04 PM) 06/02/23: Reports during TOC call "taking OTC 2,000 Units every day"   doxazosin (CARDURA) 2 MG tablet [id number] No Take 1 tablet (2 mg total) by mouth daily. Roslyn Coombe, MD Taking Active Child, Self, Pharmacy Records  ferrous sulfate 325 (65 FE) MG EC tablet [id number] No Take 325 mg by mouth 3 (three) times daily with meals. 06/18/23: Daughter reports nephrology provider prescribed iron 325 mg OTC at time of office visit on 06/16/23 Roslyn Coombe, MD Taking Active Child           Med Note (Cathleen Yagi M   Fri Jun 18, 2023 10:16 AM) 06/18/23: Daughter reports nephrology provider prescribed iron 325 mg OTC at time of office visit on 06/16/23   finasteride (PROSCAR) 5 MG tablet [id number] No TAKE 1 TABLET BY MOUTH DAILY Wrenn, John, MD Taking Active Child, Self, Pharmacy Records  furosemide (LASIX) 40 MG tablet [id number] No Take 1 tablet (40 mg total) by mouth daily. Bridgette Campus, MD  Taking Expired 05/23/23 2359            Med Note (Nalee Lightle M   Wed Jun 02, 2023  4:01 PM) 06/02/23: reports during [facility] call is taking as prescribed  glimepiride (AMARYL) 2 MG tablet [id number] No TAKE 2 TABLETS BY MOUTH DAILY  BEFORE BREAKFAST Roslyn Coombe, MD Taking Active Child, Self, Pharmacy Records  HYDROcodone bit-homatropine (HYCODAN) 5-1.5 MG/5ML syrup [id number] No Take 5 mLs by mouth every 6 (six) hours as needed for up to 10 days. Roslyn Coombe, MD Taking Active   levothyroxine (SYNTHROID) 50 MCG tablet [id number] No Take 1 tablet (50 mcg total) by mouth daily. Roslyn Coombe, MD  Taking Active Child, Self, Pharmacy Records  losartan (COZAAR) 100 MG tablet [id number] No TAKE 1 TABLET BY MOUTH DAILY Roslyn Coombe, MD Taking Active Child, Self, Pharmacy Records  pantoprazole (PROTONIX) 40 MG tablet [id number] No Take 1 tablet (40 mg total) by mouth daily. Roslyn Coombe, MD Taking Active Child, Self, Pharmacy Records  pioglitazone (ACTOS) 30 MG tablet [id number] No Take 30 mg by mouth daily. [provider] Taking Active Child, Self, Pharmacy Records  vitamin B-12 (CYANOCOBALAMIN) 100 MCG tablet [id number] No Take 100 mcg by mouth daily. [provider] Taking Active Child, Self, Pharmacy Records           Recommendation:   Continue to follow established TOC 30-day program plan of care  Follow Up Plan:   Telephone follow-up in 1 week - as scheduled 07/02/23  Plan for next week's call: Ongoing toleration of home O2 in setting of history of nosebleeds/ using only at night still/ using humidifier? Update on status of chronic cough post- initiation of home O2- still better since 06/24/23 call? Review home health visits; use of new walker- home health active or signed off on 06/24/23? Review medication adherence Reinforce/ provide education re: COPD action plan; safe use of home O2: SaO2 levels at home [facility] program case closure if no hospital re-admission/ transfer to longitudinal RN CM if patient/ daughter- caregiver agreeable  Total time spent from review to signing of note/ including any care coordination interventions:  43 minutes  Pls call/ message for questions,  Maleke Feria Mckinney Tonna Palazzi, RN, BSN, CCRN Alumnus RN Care Manager  Transitions of Care  [facility] - [facility] [phone]: direct office

## 2023-06-24 NOTE — Patient Instructions (Signed)
 Visit Information  Thank you for taking time to visit with me today. Please don't hesitate to contact me if I can be of assistance to you before our next scheduled telephone appointment.  Our next appointment is by telephone on Friday 07/02/23 at 10:00 am  Please call the care guide team at [phone] if you need to cancel or reschedule your appointment.   Following are the goals we discussed today:  Patient Goals/Self-Care Activities: Participate in Transition of Care Program/Attend TOC scheduled calls Take all medications as prescribed Attend all scheduled provider appointments Call provider office for new concerns or questions  Continue pacing activity to avoid episodes of shortness of breath Continue to follow your established action plan for episodes of shortness of breath Continue using home oxygen as prescribed/ as needed: do not allow anyone to smoke anywhere near your oxygen Continue working with the home health team that is involved in your care Use assistive devices (your new walker) as needed to prevent falls If you believe your condition is getting worse- contact your care providers (doctors) promptly- reaching out to your doctor early when you have concerns can prevent you from having to go to the hospital  If you are experiencing a Mental Health or Behavioral Health Crisis or need someone to talk to, please  call the Suicide and Crisis Lifeline: 988 call the [place]  National Suicide Prevention Lifeline: 512-072-6864 or TTY: 503 767 2140 TTY (323) 820-3618) to talk to a trained counselor call 1-800-273-TALK (toll free, 24 hour hotline) go to [facility] [address], [place] [phone]) call the [facility] Crisis Line: (650)776-0618 call 911   Patient- caregiver verbalizes understanding of instructions and care plan provided today and agrees to view in MyChart. Active MyChart status and patient understanding of how to access  instructions and care plan via MyChart confirmed with patient.      Klynn Linnemann Mckinney Nicholas Maines, RN, BSN, Media planner  Transitions of Care  [facility] - [facility] [phone]: direct office

## 2023-06-24 NOTE — Telephone Encounter (Signed)
 Copied from CRM [id number]. Topic: General - Other >> Jun 24, 2023  1:08 PM Abigail D wrote: Reason for CRM: RN Devon with [facility] calling with concerns regarding to Mr. Lough. Stated that he has +3/+4 pitting edema in both legs and wanted the clinic to have record of that. She also stated that she wants Mr. Vazguez to have a portable pack for oxygen, not a tank because its too heavy. She is requesting Dr. Autry Legions to call [employer] for an order to have that delivered to the patient.  [employer]: [phone]  [facility]: [phone]

## 2023-06-28 NOTE — Telephone Encounter (Signed)
 Ok script done hardcopy to [employer]

## 2023-06-29 ENCOUNTER — Telehealth: Payer: Self-pay

## 2023-06-29 DIAGNOSIS — J849 Interstitial pulmonary disease, unspecified: Secondary | ICD-10-CM | POA: Diagnosis not present

## 2023-06-29 DIAGNOSIS — J9601 Acute respiratory failure with hypoxia: Secondary | ICD-10-CM | POA: Diagnosis not present

## 2023-06-29 DIAGNOSIS — E1142 Type 2 diabetes mellitus with diabetic polyneuropathy: Secondary | ICD-10-CM | POA: Diagnosis not present

## 2023-06-29 DIAGNOSIS — I1310 Hypertensive heart and chronic kidney disease without heart failure, with stage 1 through stage 4 chronic kidney disease, or unspecified chronic kidney disease: Secondary | ICD-10-CM | POA: Diagnosis not present

## 2023-06-29 DIAGNOSIS — J4489 Other specified chronic obstructive pulmonary disease: Secondary | ICD-10-CM | POA: Diagnosis not present

## 2023-06-29 DIAGNOSIS — J439 Emphysema, unspecified: Secondary | ICD-10-CM | POA: Diagnosis not present

## 2023-06-29 NOTE — Telephone Encounter (Signed)
 Copied from CRM [id number]. Topic: General - Other >> Jun 24, 2023  1:10 PM Hilton Lucky wrote: Reason for CRM: Nurse with [facility] is calling to request that we fax over an order for a portable oxygen concentrator (Not the Tank - tank is too heavy) to [employer] at [phone] and can be contacted at [phone]. For questions, please reach out to [facility] staff [phone].  According to 06/24/2023 telephone encounter- PCP ordered POC.   Lm for Devona with [facility]

## 2023-06-30 DIAGNOSIS — N184 Chronic kidney disease, stage 4 (severe): Secondary | ICD-10-CM | POA: Diagnosis not present

## 2023-06-30 NOTE — Telephone Encounter (Signed)
 Order has been faxed today.

## 2023-07-01 DIAGNOSIS — J439 Emphysema, unspecified: Secondary | ICD-10-CM | POA: Diagnosis not present

## 2023-07-01 DIAGNOSIS — I1310 Hypertensive heart and chronic kidney disease without heart failure, with stage 1 through stage 4 chronic kidney disease, or unspecified chronic kidney disease: Secondary | ICD-10-CM | POA: Diagnosis not present

## 2023-07-01 DIAGNOSIS — E1142 Type 2 diabetes mellitus with diabetic polyneuropathy: Secondary | ICD-10-CM | POA: Diagnosis not present

## 2023-07-01 DIAGNOSIS — J4489 Other specified chronic obstructive pulmonary disease: Secondary | ICD-10-CM | POA: Diagnosis not present

## 2023-07-01 DIAGNOSIS — J9601 Acute respiratory failure with hypoxia: Secondary | ICD-10-CM | POA: Diagnosis not present

## 2023-07-01 DIAGNOSIS — J849 Interstitial pulmonary disease, unspecified: Secondary | ICD-10-CM | POA: Diagnosis not present

## 2023-07-01 NOTE — Telephone Encounter (Signed)
 Copied from CRM [id number]. Topic: Clinical - Prescription Issue >> Jul 01, 2023 12:21 PM Tyronne Galloway wrote: Reason for CRM: Devona with [facility] called to state that [employer] has not got any of the fax for the prescription for the POC. Please send order via [employer]. Phone number [phone]  Routing to Dr Marygrace Snellen. Do we have an account with [employer]?

## 2023-07-02 ENCOUNTER — Other Ambulatory Visit: Payer: Self-pay | Admitting: *Deleted

## 2023-07-02 NOTE — Transitions of Care (Post Inpatient/ED Visit) (Signed)
 Transition of Care  week # 5/ day # 30  Visit Note  07/02/2023  Name: Nicholas Patton MRN: [id number]          DOB: 18-Jul-1934  Situation: Patient enrolled in [facility] 30-day program. Visit completed with patient and daughter- caregiver by telephone.   HIPAA identifiers x 2 verified  Background:  Recent hospitalization March 23-25, 2025 for acute respiratory failure with hypoxia: cough/ shortness of breath; fatigue- new initiation of home O2  07/02/23:  Patient participated in TOC-30 day program and has met his previously established goals without hospital readmission; denies need for ongoing care management outreach and confirms has my direct phone number should needs arise in the future   Transition Care Management Follow-up Telephone Call    Past Medical History:  Diagnosis Date   Anemia    Arthritis    Cataract    REMOVED BILATERAL   Colon polyps    COPD (chronic obstructive pulmonary disease) (HCC)    DIABETES MELLITUS, TYPE II    takes Metformin  and Glimepiride  daily   Diverticulosis    GERD (gastroesophageal reflux disease)    takes Protonix  daily   History of colon polyps    HYPERLIPIDEMIA    takes Atorvastatin  daily   HYPERTENSION    takes Amlodipine  and Hyzaar daily   Ileus following gastrointestinal surgery (HCC) 07/22/2012   Joint pain    Peripheral neuropathy    Peripheral neuropathy    PERIPHERAL NEUROPATHY, FEET 10/06/2007   Renal disorder    VITAMIN B12 DEFICIENCY 09/06/2008   VITAMIN D  DEFICIENCY 01/16/2010   takes Vitamin D  daily   Assessment:  patient and caregiver reports "he is still doing great- no problems or concerns-  the cough is completely gone now, he is not having to use the oxygen  very much at all and we are keeping the humidifier on, so no nosebleeds.  Not using the walker or cane much; home health nurse and PT still going in for now, that is going fine.  He feels good and looks good"    Patient and caregiver both deny clinical concerns and  patient sounds to be in no distress throughout [facility] 30-day program outreach call today  Patient Reported Symptoms: Cognitive Cognitive Status: Insightful and able to interpret abstract concepts, Alert and oriented to person, place, and time, Normal speech and language skills Cognitive/Intellectual Conditions Management [RPT]: None reported or documented in medical history or problem list      Neurological Neurological Review of Symptoms: No symptoms reported    HEENT HEENT Symptoms Reported: No symptoms reported (denies recent nosebleeds: confirms continues using humidifier at home) HEENT Management Strategies: Medical device, Routine screening    Cardiovascular Cardiovascular Symptoms Reported: No symptoms reported Does patient have uncontrolled Hypertension?: No Is patient checking Blood Pressure at home?: No Cardiovascular Conditions: Hypertension Cardiovascular Management Strategies: Medication therapy, Routine screening, Diet modification  Respiratory Respiratory Symptoms Reported: No symptoms reported (confirms complete resolution of previously reported dry cough; confirms "breathing fine;" reports only using home O2 now "occasionally- mainly at night") Additional Respiratory Details: Confirmed plans to attend upcoming pulmonary provider office visit on 07/19/23 Respiratory Conditions: COPD, Shortness of breath Respiratory Management Strategies: oxygen  therapy, routine screening, adequate rest, medication therapy Oxygen  Therapy Device: nasal cannula Oxygen  Therapy Times: as needed Oxygen  Flow (L/min): 2 L/min  Endocrine Patient reports the following symptoms related to hypoglycemia or hyperglycemia : No symptoms reported Is patient diabetic?: No    Gastrointestinal Gastrointestinal Symptoms Reported: No symptoms reported Additional Gastrointestinal  Details: Reports eating "good;, "good appetite;" "normal and "regular" bowel movements Gastrointestinal Management Strategies: Adequate  rest, Diet modification    Genitourinary Genitourinary Symptoms Reported: No symptoms reported Additional Genitourinary Details: Reports "normal pee;" "clear yellow, good amount (volume)    Integumentary Integumentary Symptoms Reported: No symptoms reported    Musculoskeletal Musculoskelatal Symptoms Reviewed: No symptoms reported Additional Musculoskeletal Details: Confirmed only using assistive devices (cane/ walker) prn Musculoskeletal Management Strategies: Medical device, Routine screening, Adequate rest      Psychosocial Psychosocial Symptoms Reported: No symptoms reported         There were no vitals filed for this visit.  Medications Reviewed Today     Reviewed by Kinslie Hove M, RN (Registered Nurse) on 07/02/23 at 1002  Med List Status: <None>   Medication Order Taking? Sig Documenting Provider Last Dose Status Informant  albuterol  (VENTOLIN  HFA) 108 (90 Base) MCG/ACT inhaler [id number] No INHALE 2 PUFFS BY MOUTH EVERY 6 HOURS AS NEEDED FOR WHEEZING FOR SHORTNESS OF BREATH Hunsucker, Archer Kobs, MD Taking Active Child, Self, Pharmacy Records  allopurinol  (ZYLOPRIM ) 100 MG tablet [id number] No Take 1 tablet (100 mg total) by mouth daily. Syliva Even, MD Taking Active Child, Self, Pharmacy Records  atorvastatin  (LIPITOR) 40 MG tablet [id number] No Take 1 tablet (40 mg total) by mouth daily. Roslyn Coombe, MD Taking Active Child, Self, Pharmacy Records  BREO ELLIPTA  200-25 MCG/ACT AEPB [id number] No USE 1 INHALATION BY MOUTH ONCE  DAILY AT THE SAME TIME EACH DAY Hunsucker, Archer Kobs, MD Taking Active Child, Self, Pharmacy Records  carvedilol  (COREG ) 12.5 MG tablet [id number] No TAKE 1 TABLET BY MOUTH TWICE  DAILY WITH MEALS Roslyn Coombe, MD Taking Active   cholecalciferol  (VITAMIN D3) 25 MCG (1000 UNIT) tablet [id number] No Take 1,000 Units by mouth daily. 06/02/23: Reports during TOC call "taking OTC 2,000 Units every day" Roslyn Coombe, MD Taking Active Self           Med Note  (Amariyah Bazar M   Wed Jun 02, 2023  4:04 PM) 06/02/23: Reports during TOC call "taking OTC 2,000 Units every day"   doxazosin  (CARDURA ) 2 MG tablet [id number] No Take 1 tablet (2 mg total) by mouth daily. Roslyn Coombe, MD Taking Active Child, Self, Pharmacy Records  ferrous sulfate 325 (65 FE) MG EC tablet [id number] No Take 325 mg by mouth 3 (three) times daily with meals. 06/18/23: Daughter reports nephrology provider prescribed iron 325 mg OTC at time of office visit on 06/16/23 Roslyn Coombe, MD Taking Active Child           Med Note (Tama Grosz M   Fri Jun 18, 2023 10:16 AM) 06/18/23: Daughter reports nephrology provider prescribed iron 325 mg OTC at time of office visit on 06/16/23   finasteride  (PROSCAR ) 5 MG tablet [id number] No TAKE 1 TABLET BY MOUTH DAILY Wrenn, John, MD Taking Active Child, Self, Pharmacy Records  furosemide  (LASIX ) 40 MG tablet [id number] No Take 1 tablet (40 mg total) by mouth daily. Bridgette Campus, MD Taking Expired 05/23/23 2359            Med Note Daleen Dubs Jun 02, 2023  4:01 PM) 06/02/23: reports during [facility] call is taking as prescribed  glimepiride  (AMARYL ) 2 MG tablet [id number] No TAKE 2 TABLETS BY MOUTH DAILY  BEFORE BREAKFAST Roslyn Coombe, MD Taking Active Child, Self, Pharmacy Records  levothyroxine  (SYNTHROID ) 50 MCG tablet [id number] No Take 1 tablet (50  mcg total) by mouth daily. Roslyn Coombe, MD Taking Active Child, Self, Pharmacy Records  losartan  (COZAAR ) 100 MG tablet [id number] No TAKE 1 TABLET BY MOUTH DAILY Roslyn Coombe, MD Taking Active Child, Self, Pharmacy Records  pantoprazole  (PROTONIX ) 40 MG tablet [id number] No Take 1 tablet (40 mg total) by mouth daily. Roslyn Coombe, MD Taking Active Child, Self, Pharmacy Records  pioglitazone  (ACTOS ) 30 MG tablet [id number] No Take 30 mg by mouth daily. [provider] Taking Active Child, Self, Pharmacy Records  vitamin B-12 (CYANOCOBALAMIN ) 100 MCG tablet [id number] No Take 100 mcg by mouth  daily. [provider] Taking Active Child, Self, Pharmacy Records           Recommendation:   Continue to follow established plan of care and attend scheduled provider office visits  Follow Up Plan:   Patient has met all TOC 30-day care management goals. TOC Care Management case will be closed. Patient has been provided contact information should new needs arise.   Total time spent from review to signing of note/ including any care coordination interventions: 37 minutes  Pls call/ message for questions,  Khanh Cordner Mckinney Paulla Mcclaskey, RN, BSN, CCRN Alumnus RN Care Manager  Transitions of Care  [facility] - [facility] [phone]: direct office

## 2023-07-02 NOTE — Patient Instructions (Signed)
 Visit Information  Thank you for taking time to visit with me today. Please don't hesitate to contact me if I can be of assistance to you before our next scheduled telephone appointment.  It has been a pleasure working with you over the last 30 days!  Great job managing your care after your hospital visit!  I am glad that you are doing well!  Please do not hesitate to contact me in the future if I can be of assistance to you!  Following are the goals we discussed today:  Patient Goals/Self-Care Activities: Take all medications as prescribed Attend all scheduled provider appointments Call provider office for new concerns or questions  Continue pacing activity to avoid episodes of shortness of breath Continue to follow your established action plan for episodes of shortness of breath Continue using home oxygen as prescribed/ as needed: do not allow anyone to smoke anywhere near your oxygen Continue working with the home health team that is involved in your care Use assistive devices (your new walker) as needed to prevent falls If you believe your condition is getting worse- contact your care providers (doctors) promptly- reaching out to your doctor early when you have concerns can prevent you from having to go to the hospital  If you are experiencing a Mental Health or Behavioral Health Crisis or need someone to talk to, please  call the Suicide and Crisis Lifeline: 988 call the [place]  National Suicide Prevention Lifeline: (641)417-2654 or TTY: (435) 278-1910 TTY 7701058792) to talk to a trained counselor call 1-800-273-TALK (toll free, 24 hour hotline) go to [facility] [address] [place] [phone]) call the [facility] Crisis Line: [phone] call 911   Patient verbalizes understanding of instructions and care plan provided today and agrees to view in MyChart. Active MyChart status and patient understanding of how to access instructions  and care plan via MyChart confirmed with patient.     Leigha Olberding Mckinney Jonathon Tan, RN, BSN, Media planner  Transitions of Care  [facility] [phone]: direct office

## 2023-07-03 DIAGNOSIS — Z9181 History of falling: Secondary | ICD-10-CM | POA: Diagnosis not present

## 2023-07-03 DIAGNOSIS — J9601 Acute respiratory failure with hypoxia: Secondary | ICD-10-CM | POA: Diagnosis not present

## 2023-07-03 DIAGNOSIS — D631 Anemia in chronic kidney disease: Secondary | ICD-10-CM | POA: Diagnosis not present

## 2023-07-03 DIAGNOSIS — I1310 Hypertensive heart and chronic kidney disease without heart failure, with stage 1 through stage 4 chronic kidney disease, or unspecified chronic kidney disease: Secondary | ICD-10-CM | POA: Diagnosis not present

## 2023-07-03 DIAGNOSIS — I700 Atherosclerosis of aorta: Secondary | ICD-10-CM | POA: Diagnosis not present

## 2023-07-03 DIAGNOSIS — I447 Left bundle-branch block, unspecified: Secondary | ICD-10-CM | POA: Diagnosis not present

## 2023-07-03 DIAGNOSIS — J439 Emphysema, unspecified: Secondary | ICD-10-CM | POA: Diagnosis not present

## 2023-07-03 DIAGNOSIS — D630 Anemia in neoplastic disease: Secondary | ICD-10-CM | POA: Diagnosis not present

## 2023-07-03 DIAGNOSIS — N179 Acute kidney failure, unspecified: Secondary | ICD-10-CM | POA: Diagnosis not present

## 2023-07-03 DIAGNOSIS — J849 Interstitial pulmonary disease, unspecified: Secondary | ICD-10-CM | POA: Diagnosis not present

## 2023-07-03 DIAGNOSIS — K219 Gastro-esophageal reflux disease without esophagitis: Secondary | ICD-10-CM | POA: Diagnosis not present

## 2023-07-03 DIAGNOSIS — J4489 Other specified chronic obstructive pulmonary disease: Secondary | ICD-10-CM | POA: Diagnosis not present

## 2023-07-03 DIAGNOSIS — E1122 Type 2 diabetes mellitus with diabetic chronic kidney disease: Secondary | ICD-10-CM | POA: Diagnosis not present

## 2023-07-03 DIAGNOSIS — E039 Hypothyroidism, unspecified: Secondary | ICD-10-CM | POA: Diagnosis not present

## 2023-07-03 DIAGNOSIS — E1142 Type 2 diabetes mellitus with diabetic polyneuropathy: Secondary | ICD-10-CM | POA: Diagnosis not present

## 2023-07-03 DIAGNOSIS — Z7984 Long term (current) use of oral hypoglycemic drugs: Secondary | ICD-10-CM | POA: Diagnosis not present

## 2023-07-03 DIAGNOSIS — D472 Monoclonal gammopathy: Secondary | ICD-10-CM | POA: Diagnosis not present

## 2023-07-03 DIAGNOSIS — E785 Hyperlipidemia, unspecified: Secondary | ICD-10-CM | POA: Diagnosis not present

## 2023-07-03 DIAGNOSIS — Z7951 Long term (current) use of inhaled steroids: Secondary | ICD-10-CM | POA: Diagnosis not present

## 2023-07-03 DIAGNOSIS — N184 Chronic kidney disease, stage 4 (severe): Secondary | ICD-10-CM | POA: Diagnosis not present

## 2023-07-06 ENCOUNTER — Ambulatory Visit: Payer: Self-pay | Admitting: *Deleted

## 2023-07-06 NOTE — Telephone Encounter (Signed)
  Chief Complaint: bilateral feet/ankle/ leg swelling with pain Symptoms: bilateral feet/ankle swelling up to lower leg. Pain with walking can be severe. Mild pain at rest. Patient daughter reports dx dermatitis and skin hard and dark in color.  Frequency: 2-3 days  Pertinent Negatives: Patient denies chest pain with difficulty breathing no fever Disposition: [] ED /[] Urgent Care (no appt availability in office) / [x] Appointment(In office/virtual)/ []  [facility]/ [] Home Care/ [] Refused Recommended Disposition /[] [facility]/ []  Follow-up with PCP Additional Notes:   Appt scheduled tomorrow.      Copied from CRM [id number]. Topic: Clinical - Red Word Triage >> Jul 06, 2023  4:18 PM Hamp Levine R wrote: [facility] that prompted transfer to Nurse Triage: Patient called to advise he is experiencing swelling in both legs. Warm transferred to nurse triage Reason for Disposition  [1] MODERATE leg swelling (e.g., swelling extends up to knees) AND [2] new-onset or worsening  Answer Assessment - Initial Assessment Questions 1. ONSET: "When did the swelling start?" (e.g., minutes, hours, days)     2-3 days  2. LOCATION: "What part of the leg is swollen?"  "Are both legs swollen or just one leg?"     Feet and ankles lower legs 3. SEVERITY: "How bad is the swelling?" (e.g., localized; mild, moderate, severe)   - Localized: Small area of swelling localized to one leg.   - MILD pedal edema: Swelling limited to foot and ankle, pitting edema < 1/4 inch (6 mm) deep, rest and elevation eliminate most or all swelling.   - MODERATE edema: Swelling of lower leg to knee, pitting edema > 1/4 inch (6 mm) deep, rest and elevation only partially reduce swelling.   - SEVERE edema: Swelling extends above knee, facial or hand swelling present.      Hard and dark  4. REDNESS: "Does the swelling look red or infected?"     na 5. PAIN: "Is the swelling painful to touch?" If Yes, ask: "How painful is it?"    (Scale 1-10; mild, moderate or severe)     Mild at rest severe with walking  6. FEVER: "Do you have a fever?" If Yes, ask: "What is it, how was it measured, and when did it start?"      na 7. CAUSE: "What do you think is causing the leg swelling?"     Not sure  8. MEDICAL HISTORY: "Do you have a history of blood clots (e.g., DVT), cancer, heart failure, kidney disease, or liver failure?"     na 9. RECURRENT SYMPTOM: "Have you had leg swelling before?" If Yes, ask: "When was the last time?" "What happened that time?"     Yes  10. OTHER SYMPTOMS: "Do you have any other symptoms?" (e.g., chest pain, difficulty breathing)       Dermitis skin hard and dark and mild pain at rest severe pain walking  11. PREGNANCY: "Is there any chance you are pregnant?" "When was your last menstrual period?"       na  Protocols used: Leg Swelling and Edema-A-AH

## 2023-07-07 ENCOUNTER — Ambulatory Visit (INDEPENDENT_AMBULATORY_CARE_PROVIDER_SITE_OTHER): Admitting: Internal Medicine

## 2023-07-07 ENCOUNTER — Encounter: Payer: Self-pay | Admitting: Internal Medicine

## 2023-07-07 VITALS — BP 138/68 | HR 79 | Temp 97.7°F | Ht 74.0 in | Wt 184.5 lb

## 2023-07-07 DIAGNOSIS — J439 Emphysema, unspecified: Secondary | ICD-10-CM | POA: Diagnosis not present

## 2023-07-07 DIAGNOSIS — L309 Dermatitis, unspecified: Secondary | ICD-10-CM | POA: Diagnosis not present

## 2023-07-07 DIAGNOSIS — N184 Chronic kidney disease, stage 4 (severe): Secondary | ICD-10-CM | POA: Insufficient documentation

## 2023-07-07 DIAGNOSIS — R600 Localized edema: Secondary | ICD-10-CM | POA: Diagnosis not present

## 2023-07-07 DIAGNOSIS — J9611 Chronic respiratory failure with hypoxia: Secondary | ICD-10-CM | POA: Diagnosis not present

## 2023-07-07 MED ORDER — FUROSEMIDE 40 MG PO TABS: ORAL_TABLET | ORAL | 3 refills | Status: DC

## 2023-07-07 MED ORDER — BETAMETHASONE DIPROPIONATE 0.05 % EX CREA: TOPICAL_CREAM | Freq: Two times a day (BID) | CUTANEOUS | 1 refills | Status: AC

## 2023-07-07 NOTE — Assessment & Plan Note (Signed)
 Stable on 2L [place] intermittent with exertion prn

## 2023-07-07 NOTE — Progress Notes (Signed)
 Patient ID: [name], male   DOB: 1934/11/14, 89 y.o.   MRN: [id number]        Chief Complaint: follow up bilateral leg swelling       HPI:  Nicholas Patton is a 89 y.o. male here with c/o worsening mild leg swelling ; did see renal x 2 wlks ago with labs, and asked to increase the lasix  to 40 bid for 4 days, then resume 40 mg qam only.  Swelling was better, then worse again.   Has recent echo with mild reduced RV fxn as well and chronic lung disease, CKD.  Brought his oxygen today but not wearing as he only takes 2L [place] prn exertion.  Also has worsening skin irritation dermatitis to medial left leg and ankle with some discomfort to walk.  No falls.   Pt denies fever, night sweats, loss of appetite, or other constitutional symptoms but has lost several lbs since last seen.         Wt Readings from Last 3 Encounters:  07/07/23 184 lb 8 oz (83.7 kg)  06/16/23 187 lb (84.8 kg)  06/01/23 188 lb 15 oz (85.7 kg)   BP Readings from Last 3 Encounters:  07/07/23 138/68  06/16/23 124/62  06/01/23 (!) 141/93         Past Medical History:  Diagnosis Date   Anemia    Arthritis    Cataract    REMOVED BILATERAL   Colon polyps    COPD (chronic obstructive pulmonary disease) (HCC)    DIABETES MELLITUS, TYPE II    takes Metformin  and Glimepiride  daily   Diverticulosis    GERD (gastroesophageal reflux disease)    takes Protonix  daily   History of colon polyps    HYPERLIPIDEMIA    takes Atorvastatin  daily   HYPERTENSION    takes Amlodipine  and Hyzaar daily   Ileus following gastrointestinal surgery (HCC) 07/22/2012   Joint pain    Peripheral neuropathy    Peripheral neuropathy    PERIPHERAL NEUROPATHY, FEET 10/06/2007   Renal disorder    VITAMIN B12 DEFICIENCY 09/06/2008   VITAMIN D  DEFICIENCY 01/16/2010   takes Vitamin D  daily   Past Surgical History:  Procedure Laterality Date   CATARACT EXTRACTION     both eyes   CHOLECYSTECTOMY N/A 07/18/2012   Procedure: LAPAROSCOPIC  CHOLECYSTECTOMY WITH INTRAOPERATIVE CHOLANGIOGRAM;  Surgeon: Cloyce Darby, MD;  Location: [facility] OR;  Service: General;  Laterality: N/A;   COLONOSCOPY     EYE SURGERY Right 01/2023   POLYPECTOMY     POSTERIOR CERVICAL FUSION/FORAMINOTOMY N/A 06/27/2015   Procedure: Posterior Cervical Fusion with lateral mass fixation Cervical four to cervical seven;  Surgeon: Isadora Mar, MD;  Location: [facility] NEURO ORS;  Service: Neurosurgery;  Laterality: N/A;   TOTAL KNEE ARTHROPLASTY  2001   Left   TURP VAPORIZATION      reports that he quit smoking about 39 years ago. His smoking use included cigarettes. He started smoking about 70 years ago. He has a 31 pack-year smoking history. He has never been exposed to tobacco smoke. He has never used smokeless tobacco. He reports that he does not drink alcohol and does not use drugs. family history includes Diabetes Mellitus II in his mother; Lung cancer in his brother. Allergies  Allergen Reactions   Amlodipine  Swelling   Cymbalta  [Duloxetine  Hcl] Other (See Comments)   Oxycodone  Other (See Comments)   Current Outpatient Medications on File Prior to Visit  Medication Sig Dispense Refill  albuterol  (VENTOLIN  HFA) 108 (90 Base) MCG/ACT inhaler INHALE 2 PUFFS BY MOUTH EVERY 6 HOURS AS NEEDED FOR WHEEZING FOR SHORTNESS OF BREATH 18 g 2   allopurinol  (ZYLOPRIM ) 100 MG tablet Take 1 tablet (100 mg total) by mouth daily. 90 tablet 1   atorvastatin  (LIPITOR) 40 MG tablet Take 1 tablet (40 mg total) by mouth daily. 90 tablet 3   BREO ELLIPTA  200-25 MCG/ACT AEPB USE 1 INHALATION BY MOUTH ONCE  DAILY AT THE SAME TIME EACH DAY 180 each 3   carvedilol  (COREG ) 12.5 MG tablet TAKE 1 TABLET BY MOUTH TWICE  DAILY WITH MEALS 180 tablet 3   cholecalciferol  (VITAMIN D3) 25 MCG (1000 UNIT) tablet Take 1,000 Units by mouth daily. 06/02/23: Reports during TOC call "taking OTC 2,000 Units every day"     doxazosin  (CARDURA ) 2 MG tablet Take 1 tablet (2 mg total) by mouth daily. 90  tablet 3   ferrous sulfate 325 (65 FE) MG EC tablet Take 325 mg by mouth 3 (three) times daily with meals. 06/18/23: Daughter reports nephrology provider prescribed iron 325 mg OTC at time of office visit on 06/16/23     finasteride  (PROSCAR ) 5 MG tablet TAKE 1 TABLET BY MOUTH DAILY 90 tablet 3   glimepiride  (AMARYL ) 2 MG tablet TAKE 2 TABLETS BY MOUTH DAILY  BEFORE BREAKFAST 180 tablet 3   levothyroxine  (SYNTHROID ) 50 MCG tablet Take 1 tablet (50 mcg total) by mouth daily. 90 tablet 3   losartan  (COZAAR ) 100 MG tablet TAKE 1 TABLET BY MOUTH DAILY 90 tablet 3   pantoprazole  (PROTONIX ) 40 MG tablet Take 1 tablet (40 mg total) by mouth daily. 90 tablet 3   pioglitazone  (ACTOS ) 30 MG tablet Take 30 mg by mouth daily.     vitamin B-12 (CYANOCOBALAMIN ) 100 MCG tablet Take 100 mcg by mouth daily.     No current facility-administered medications on file prior to visit.        ROS:  All others reviewed and negative.  Objective        PE:  BP 138/68   Pulse 79   Temp 97.7 F (36.5 C) (Temporal)   Ht 6\' 2"  (1.88 m)   Wt 184 lb 8 oz (83.7 kg)   SpO2 97%   BMI 23.69 kg/m                 Constitutional: Pt appears in NAD               HENT: Head: NCAT.                Right Ear: External ear normal.                 Left Ear: External ear normal.                Eyes: . Pupils are equal, round, and reactive to light. Conjunctivae and EOM are normal               Nose: without d/c or deformity               Neck: Neck supple. Gross normal ROM               Cardiovascular: Normal rate and regular rhythm.                 Pulmonary/Chest: Effort normal and breath sounds without rales or wheezing.  Abd:  Soft, NT, ND, + BS, no organomegaly               Neurological: Pt is alert. At baseline orientation, motor grossly intact               Skin: Skin is warm. No rashes, LE edema - 1-2+ bilat leg edema to below knees; large plaque like area left medial leg and ankle slight tender raised  erythema               Psychiatric: Pt behavior is normal without agitation   Micro: none  Cardiac tracings I have personally interpreted today:  none  Pertinent Radiological findings (summarize): none   Lab Results  Component Value Date   WBC 5.2 06/01/2023   HGB 9.2 (L) 06/01/2023   HCT 28.5 (L) 06/01/2023   PLT 202 06/01/2023   GLUCOSE 193 (H) 06/01/2023   CHOL 131 04/23/2023   TRIG 65.0 04/23/2023   HDL 49.70 04/23/2023   LDLCALC 69 04/23/2023   ALT 22 05/31/2023   AST 21 05/31/2023   NA 137 06/01/2023   K 3.9 06/01/2023   CL 108 06/01/2023   CREATININE 2.79 (H) 06/01/2023   BUN 65 (H) 06/01/2023   CO2 21 (L) 06/01/2023   TSH 5.44 04/23/2023   PSA 4.61 (H) 04/09/2017   INR 1.12 06/19/2015   HGBA1C 7.4 (H) 04/23/2023   MICROALBUR 1.7 04/23/2023   Assessment/Plan:  Nicholas Patton is a 89 y.o. Black or African American [2] male with  has a past medical history of Anemia, Arthritis, Cataract, Colon polyps, COPD (chronic obstructive pulmonary disease) (HCC), DIABETES MELLITUS, TYPE II, Diverticulosis, GERD (gastroesophageal reflux disease), History of colon polyps, HYPERLIPIDEMIA, HYPERTENSION, Ileus following gastrointestinal surgery (HCC) (07/22/2012), Joint pain, Peripheral neuropathy, Peripheral neuropathy, PERIPHERAL NEUROPATHY, FEET (10/06/2007), Renal disorder, VITAMIN B12 DEFICIENCY (09/06/2008), and VITAMIN D  DEFICIENCY (01/16/2010).  Peripheral edema D/w pt and family - ok for lasix  40 mg qam, but then 40 mg in the PM prn persistent leg swelling  Dermatitis /Mild to mod, for betamethasone cr asd, refer dermatology, to f/u any worsening symptoms or concerns   Chronic hypoxemic respiratory failure (HCC) Stable on 2L [place] intermittent with exertion prn  COPD with emphysema (HCC) Stable overall, cont current med tx  CKD (chronic kidney disease) stage 4, GFR 15-29 ml/min (HCC) Lab Results  Component Value Date   CREATININE 2.79 (H) 06/01/2023   Stable overall,  cont to avoid nephrotoxins, f/u renal as planned  Followup: Return in about 6 months (around 01/06/2024).  Rosalia Colonel, MD 07/07/2023 12:47 PM [facility] [place] [facility]

## 2023-07-07 NOTE — Assessment & Plan Note (Signed)
/  Mild to mod, for betamethasone cr asd, refer dermatology, to f/u any worsening symptoms or concerns

## 2023-07-07 NOTE — Assessment & Plan Note (Signed)
 Lab Results  Component Value Date   CREATININE 2.79 (H) 06/01/2023   Stable overall, cont to avoid nephrotoxins, f/u renal as planned

## 2023-07-07 NOTE — Assessment & Plan Note (Signed)
 D/w pt and family - ok for lasix  40 mg qam, but then 40 mg in the PM prn persistent leg swelling

## 2023-07-07 NOTE — Patient Instructions (Signed)
 Ok to increase the lasix  to 40 mg in the AM every day, but also take 40 mg in the afternoon as needed for persistent swelling  Please take all new medication as prescribed - the cream  Please continue all other medications as before, and refills have been done if requested.  Please have the pharmacy call with any other refills you may need.  Please keep your appointments with your specialists as you may have planned  You will be contacted regarding the referral for: Dermatology  We can hold on lab testing today  Please make an Appointment to return in 6 months, or sooner if needed

## 2023-07-07 NOTE — Assessment & Plan Note (Signed)
Stable overall, cont current med tx 

## 2023-07-08 NOTE — Telephone Encounter (Signed)
 Called Devon with [facility]. Informed them that our provider did not order the POC and would need to contact the PCP's office to have it sent. I provided the PCP's office number to Devona. Devona is aware that the PCP ordered it.

## 2023-07-09 NOTE — Telephone Encounter (Signed)
 Copied from CRM [id number]. Topic: General - Other >> Jul 09, 2023 10:26 AM Chuck Crater wrote: Reason for CRM: Devon with [employer] [facility] states that Patient wants a Portable oxygen concentrator and [employer] is requesting that we send a parachute with the order.

## 2023-07-11 ENCOUNTER — Telehealth: Payer: Self-pay | Admitting: Internal Medicine

## 2023-07-11 DIAGNOSIS — J439 Emphysema, unspecified: Secondary | ICD-10-CM

## 2023-07-11 DIAGNOSIS — J9611 Chronic respiratory failure with hypoxia: Secondary | ICD-10-CM

## 2023-07-11 NOTE — Telephone Encounter (Signed)
 Ok order done to [employer]

## 2023-07-15 DIAGNOSIS — J439 Emphysema, unspecified: Secondary | ICD-10-CM | POA: Diagnosis not present

## 2023-07-15 DIAGNOSIS — J849 Interstitial pulmonary disease, unspecified: Secondary | ICD-10-CM | POA: Diagnosis not present

## 2023-07-15 DIAGNOSIS — J9601 Acute respiratory failure with hypoxia: Secondary | ICD-10-CM | POA: Diagnosis not present

## 2023-07-15 DIAGNOSIS — J4489 Other specified chronic obstructive pulmonary disease: Secondary | ICD-10-CM | POA: Diagnosis not present

## 2023-07-15 DIAGNOSIS — E1142 Type 2 diabetes mellitus with diabetic polyneuropathy: Secondary | ICD-10-CM | POA: Diagnosis not present

## 2023-07-15 DIAGNOSIS — I1310 Hypertensive heart and chronic kidney disease without heart failure, with stage 1 through stage 4 chronic kidney disease, or unspecified chronic kidney disease: Secondary | ICD-10-CM | POA: Diagnosis not present

## 2023-07-15 NOTE — Telephone Encounter (Signed)
 Copied from CRM [id number]. Topic: Clinical - Order For Equipment >> Jul 15, 2023 12:22 PM Deaijah H wrote: Reason for CRM: [facility] nurse w/ [facility] home health called in needing compact o2 concentrator. Would like order emailed to [email or web address]. Callback [phone] Devon.

## 2023-07-15 NOTE — Telephone Encounter (Signed)
 Order has been faxed to [facility] as we can not email it.

## 2023-07-15 NOTE — Telephone Encounter (Signed)
 Order has been faxed to [employer]

## 2023-07-19 ENCOUNTER — Encounter: Payer: Self-pay | Admitting: Pulmonary Disease

## 2023-07-19 ENCOUNTER — Ambulatory Visit (INDEPENDENT_AMBULATORY_CARE_PROVIDER_SITE_OTHER): Admitting: Pulmonary Disease

## 2023-07-19 VITALS — BP 171/68 | HR 76 | Ht 74.0 in | Wt 186.0 lb

## 2023-07-19 DIAGNOSIS — J432 Centrilobular emphysema: Secondary | ICD-10-CM | POA: Diagnosis not present

## 2023-07-19 DIAGNOSIS — R0609 Other forms of dyspnea: Secondary | ICD-10-CM | POA: Diagnosis not present

## 2023-07-19 NOTE — Patient Instructions (Signed)
 It is nice to see you again  I am sorry the Trelegy made things worse, we will avoid this  Continue Breo 1 puff once a day, rinse your mouth after use  We will get a new orders for the portable oxygen  concentrator to be used anytime you exert yourself, you can use your home concentrator in the home by the time you leave the home I recommend taking this with you and wearing it when walking around.  Return to clinic in 3 months or sooner as needed with Dr. Marygrace Snellen

## 2023-07-19 NOTE — Progress Notes (Signed)
 Patient ID: Nicholas Patton, male    DOB: 1934/10/07, 89 y.o.   MRN: [id number]  Chief Complaint  Patient presents with   Follow-up    Pt state SOB, would like a POC     Referring provider: Roslyn Coombe, MD  HPI:   Nicholas Patton is a 89 y.o. man with reported "COPD" with spirometry without fixed obstruction but with significant bronchodilator response whom we are seeing in follow up for asthma, DOE as well as hospital follow-up for cough with new start of oxygen , new chronic hypoxic respiratory failure.  Multiple hospital notes reviewed.  Multiple telephone encounters from pulmonary office reviewed.  Multiple PCP notes reviewed.  Here for hospital follow-up.  Worsening symptoms at last visit.  Overall not well responding to inhalers.  We escalated Breo to Trelegy again.  In the past did not help much.  It seems like actually make the cough worse.  This prompted ED visit.  CT scan reviewed and interpreted as significant emphysematous changes, no other significant findings to explain cough etc.  Overall good news.  Trelegy was stopped.  He is placed on Breo due to some narcotic cough syrup for period time.  Things are better now.  He was discharged with oxygen .  Uses home concentrator and tanks.  Tanks too heavy to carry.  He would like to use POC.  He was walked today qualified for this.  HPI at initial visit: Has had some mild dyspnea exertion going on some time now.  At least months to years.  He has been told he has COPD in the past but has never had spirometry.  Shortness of breath worse walking longer distances or on inclines.  Rest resolve symptoms.  No real shortness of breath at rest.  He has hayfever, seasonal allergies regularly.  Will use medicines to treat this when bad.  No recurrent pneumonias in the past no real bad bouts of bronchitis.  No asthma or exertional limitation as a child.  No syncope or presyncope.  Reportedly has occasionally received prednisone  for some breathing  difficulties in the past.  Is been at least 2 years or more since last use.  Spirometry recently demonstrates ratio at 68 to 69% but given his age this is over 87% predicted and within normal limits.  Significant bronchodilator response in both FEV1 and FVC.  CT scan 05/2019 personally reviewed and interpreted as diffuse significant upper lobe predominant emphysema without other significant abnormality.  PMH: Diabetes, hypertension Surgical history: Cataracts, neck surgery/fusion next knee replacement, cholecystectomy Family history: No history of lung cancer Social history: Former smoker, quit late 80s, lives in [place] / Pulmonary Flowsheets:   ACT:      No data to display          MMRC: mMRC Dyspnea Scale mMRC Score  11/18/2020  4:28 PM 3  05/15/2020  1:35 PM 0    Epworth:      No data to display          Tests:   FENO:  No results found for: "NITRICOXIDE"  PFT:    Latest Ref Rng & Units 10/04/2019   10:53 AM  PFT Results  FVC-Pre L 3.21  P  FVC-Predicted Pre % 78  P  FVC-Post L 3.67  P  FVC-Predicted Post % 90  P  Pre FEV1/FVC % % 68  P  Post FEV1/FCV % % 69  P  FEV1-Pre L 2.18  P  FEV1-Predicted Pre %  73  P  FEV1-Post L 2.55  P  DLCO uncorrected ml/min/mmHg 12.86  P  DLCO UNC% % 49  P  DLCO corrected ml/min/mmHg 12.94  P  DLCO COR %Predicted % 49  P  DLVA Predicted % 50  P  TLC L 7.34  P  TLC % Predicted % 95  P  RV % Predicted % 120  P    P Preliminary result  Personally reviewed and interpreted as normal spirometry with significant bronchodilator response.  TLC within normal limits, RV gas trapping.  DLCO severely reduced.  WALK:      No data to display          Imaging: No results found.  Lab Results:  CBC    Component Value Date/Time   WBC 5.2 06/01/2023 0544   RBC 3.15 (L) 06/01/2023 0544   HGB 9.2 (L) 06/01/2023 0544   HGB 12.9 (L) 11/10/2022 0944   HCT 28.5 (L) 06/01/2023 0544   PLT 202 06/01/2023 0544    PLT 143 (L) 11/10/2022 0944   MCV 90.5 06/01/2023 0544   MCH 29.2 06/01/2023 0544   MCHC 32.3 06/01/2023 0544   RDW 15.0 06/01/2023 0544   LYMPHSABS 0.8 05/30/2023 1348   MONOABS 0.4 05/30/2023 1348   EOSABS 0.1 05/30/2023 1348   BASOSABS 0.0 05/30/2023 1348    BMET    Component Value Date/Time   NA 137 06/01/2023 0544   NA 142 03/08/2023 0933   K 3.9 06/01/2023 0544   CL 108 06/01/2023 0544   CO2 21 (L) 06/01/2023 0544   GLUCOSE 193 (H) 06/01/2023 0544   BUN 65 (H) 06/01/2023 0544   BUN 50 (H) 03/08/2023 0933   CREATININE 2.79 (H) 06/01/2023 0544   CREATININE 2.27 (H) 11/10/2022 0944   CREATININE 1.68 (H) 10/04/2019 1115   CALCIUM  8.6 (L) 06/01/2023 0544   GFRNONAA 21 (L) 06/01/2023 0544   GFRNONAA 27 (L) 11/10/2022 0944   GFRNONAA 37 (L) 10/04/2019 1115   GFRAA 41 (L) 10/25/2019 1052   GFRAA 43 (L) 10/04/2019 1115    BNP    Component Value Date/Time   BNP 386.8 (H) 05/30/2023 1348    ProBNP No results found for: "PROBNP"  Specialty Problems       Pulmonary Problems   Allergic rhinitis   Qualifier: Diagnosis of  By: Autry Legions MD, Alveda Aures       Acute upper respiratory infection   Dyspnea   Left-sided nosebleed   Asthma   Acute cough   Acute asthmatic bronchitis   COPD with emphysema (HCC)   Nosebleed   Acute hypoxic respiratory failure (HCC)   Chronic hypoxemic respiratory failure (HCC)    Allergies  Allergen Reactions   Amlodipine  Swelling   Cymbalta  [Duloxetine  Hcl] Other (See Comments)   Oxycodone  Other (See Comments)    Immunization History  Administered Date(s) Administered   Fluad Quad(high Dose 65+) 11/09/2018, 11/30/2019, 12/14/2020, 04/22/2022   Fluad Trivalent(High Dose 65+) 04/23/2023   Influenza Whole 11/21/2015   Influenza, High Dose Seasonal PF 01/24/2013, 12/01/2016, 11/22/2017   Influenza,inj,Quad PF,6+ Mos 12/08/2013   Influenza-Unspecified 02/08/2015   PFIZER(Purple Top)SARS-COV-2 Vaccination 05/03/2019, 05/04/2019,  12/23/2019, 10/10/2020   Pneumococcal Conjugate-13 03/22/2013   Pneumococcal Polysaccharide-23 01/10/2016   Td 10/05/2014   Zoster Recombinant(Shingrix) 07/25/2021, 01/19/2022    Past Medical History:  Diagnosis Date   Anemia    Arthritis    Cataract    REMOVED BILATERAL   Colon polyps    COPD (chronic obstructive pulmonary disease) (  HCC)    DIABETES MELLITUS, TYPE II    takes Metformin  and Glimepiride  daily   Diverticulosis    GERD (gastroesophageal reflux disease)    takes Protonix  daily   History of colon polyps    HYPERLIPIDEMIA    takes Atorvastatin  daily   HYPERTENSION    takes Amlodipine  and Hyzaar daily   Ileus following gastrointestinal surgery (HCC) 07/22/2012   Joint pain    Peripheral neuropathy    Peripheral neuropathy    PERIPHERAL NEUROPATHY, FEET 10/06/2007   Renal disorder    VITAMIN B12 DEFICIENCY 09/06/2008   VITAMIN D  DEFICIENCY 01/16/2010   takes Vitamin D  daily    Tobacco History: Social History   Tobacco Use  Smoking Status Former   Current packs/day: 0.00   Average packs/day: 1 pack/day for 31.0 years (31.0 ttl pk-yrs)   Types: Cigarettes   Start date: 12/22/1952   Quit date: 12/23/1983   Years since quitting: 39.5   Passive exposure: Never  Smokeless Tobacco Never   Counseling given: Not Answered    Continue to not smoke  Outpatient Encounter Medications as of 07/19/2023  Medication Sig   albuterol  (VENTOLIN  HFA) 108 (90 Base) MCG/ACT inhaler INHALE 2 PUFFS BY MOUTH EVERY 6 HOURS AS NEEDED FOR WHEEZING FOR SHORTNESS OF BREATH   allopurinol  (ZYLOPRIM ) 100 MG tablet Take 1 tablet (100 mg total) by mouth daily.   atorvastatin  (LIPITOR) 40 MG tablet Take 1 tablet (40 mg total) by mouth daily.   betamethasone  dipropionate 0.05 % cream Apply topically 2 (two) times daily.   BREO ELLIPTA  200-25 MCG/ACT AEPB USE 1 INHALATION BY MOUTH ONCE  DAILY AT THE SAME TIME EACH DAY   carvedilol  (COREG ) 12.5 MG tablet TAKE 1 TABLET BY MOUTH TWICE   DAILY WITH MEALS   cholecalciferol  (VITAMIN D3) 25 MCG (1000 UNIT) tablet Take 1,000 Units by mouth daily. 06/02/23: Reports during TOC call "taking OTC 2,000 Units every day"   doxazosin  (CARDURA ) 2 MG tablet Take 1 tablet (2 mg total) by mouth daily.   ferrous sulfate 325 (65 FE) MG EC tablet Take 325 mg by mouth 3 (three) times daily with meals. 06/18/23: Daughter reports nephrology provider prescribed iron 325 mg OTC at time of office visit on 06/16/23   finasteride  (PROSCAR ) 5 MG tablet TAKE 1 TABLET BY MOUTH DAILY   furosemide  (LASIX ) 40 MG tablet Take 1 tab by mouth in the AM every day, and 1 tab by mouth in the afternoon for persistent swelling as needed   glimepiride  (AMARYL ) 2 MG tablet TAKE 2 TABLETS BY MOUTH DAILY  BEFORE BREAKFAST   levothyroxine  (SYNTHROID ) 50 MCG tablet Take 1 tablet (50 mcg total) by mouth daily.   losartan  (COZAAR ) 100 MG tablet TAKE 1 TABLET BY MOUTH DAILY   pantoprazole  (PROTONIX ) 40 MG tablet Take 1 tablet (40 mg total) by mouth daily.   pioglitazone  (ACTOS ) 30 MG tablet Take 30 mg by mouth daily.   vitamin B-12 (CYANOCOBALAMIN ) 100 MCG tablet Take 100 mcg by mouth daily.   No facility-administered encounter medications on file as of 07/19/2023.     Review of Systems  Review of Systems  N/a Physical Exam  BP (!) 171/68 (BP Location: Left Arm, Patient Position: Sitting, Cuff Size: Normal)   Pulse 76   Ht 6\' 2"  (1.88 m)   Wt 186 lb (84.4 kg)   SpO2 90%   BMI 23.88 kg/m   Wt Readings from Last 5 Encounters:  07/19/23 186 lb (84.4 kg)  07/07/23  184 lb 8 oz (83.7 kg)  06/16/23 187 lb (84.8 kg)  06/01/23 188 lb 15 oz (85.7 kg)  05/10/23 193 lb (87.5 kg)    BMI Readings from Last 5 Encounters:  07/19/23 23.88 kg/m  07/07/23 23.69 kg/m  06/16/23 24.01 kg/m  06/01/23 24.26 kg/m  05/10/23 24.78 kg/m     Physical Exam General: Well-appearing, in no acute distress Eyes: EOMI, no icterus Neck: Supple, no JVP appreciated sitting  upright Respiratory: Clear to auscultation bilaterally, normal work of breathing on room air Extremities: Warm, no edema MSK: No synovitis, no joint effusions Neuro: Normal gait, no weakness Psych: Normal mood, full affect   Assessment & Plan:   Nicholas Patton is a 89 y.o. man with reported COPD with most recent spirometry without fixed obstruction but with significant bronchodilator response whom we are seeing for follow up for evaluation of DOE and recent hospitalization, hospital follow-up.  DOE: PFTs are suggestive of asthma and this is likely a driver of symptoms.  Some of deconditioning given he admits to being less active.  Improved with ICS/LABA therapy but still present. Additionally, DLCO is severely reduced which is related to emphysema seen on CT but also concern for contribution of pulmonary HTN given elevated PA pressures seen on TTE in 2016. Repeat TTE 10/2019 with mildly elevated PASP, RV enlargement but normal RV function and size.  Also developing element of deconditioning given his lack of activity in the preceding months.  Escalated to Trelegy fall 2022 without improvement.  Subsequent improvement after resuming Jodell Munda Winter 2022.  With what seemed to be less improvement over the next several months.  Escalated to Breztri  follow-up 2023 with worsening symptoms.  Tried Trelegy, no improvement.  CT high-resolution 03/2022 with emphysema, no other concerning findings.  VQ scan, negative for acute or chronic PE in 2025.  Suspect his shortness of breath related to asthma/emphysema is being maximally treated currently.  In addition, likely pulmonary hypertension contributing.  However given his significant emphysema, lack of ILD, he is a poor candidate for systemic and inhaled pulmonary vasodilators.  Given very high likelihood of worsening decompensation with treatment and unlikely improvement, no further workup, diagnostic test for pulmonary hypertension are recommended.   Asthma: Had  improved with ICS/LABA.  Encouraged increased exercise.  Will response to inhalers historically.  Cough worsened with Trelegy.  Continue Breo.  Chronic hypoxemic respiratory failure: Related to emphysema.  Oxygen  saturation at rest 90%.  With exertion, the patient's oxygen  saturations decreased to 87%.  Oxygen  via nasal cannula via portable oxygen  concentrator was applied and the patient required 3 L via POC to maintain oxygen  saturation of 97%.  Return in about 3 months (around 10/19/2023) for f/u Dr. Marygrace Snellen.   Guerry Leek, MD 07/19/2023  I spent 41 minutes in the care of the patient including face-to-face visit, review of records, coordination of care.

## 2023-07-20 ENCOUNTER — Encounter: Payer: Self-pay | Admitting: Podiatry

## 2023-07-20 ENCOUNTER — Ambulatory Visit (INDEPENDENT_AMBULATORY_CARE_PROVIDER_SITE_OTHER): Payer: Medicare Other | Admitting: Podiatry

## 2023-07-20 VITALS — Ht 74.0 in | Wt 186.0 lb

## 2023-07-20 DIAGNOSIS — M79675 Pain in left toe(s): Secondary | ICD-10-CM | POA: Diagnosis not present

## 2023-07-20 DIAGNOSIS — E1142 Type 2 diabetes mellitus with diabetic polyneuropathy: Secondary | ICD-10-CM | POA: Diagnosis not present

## 2023-07-20 DIAGNOSIS — B351 Tinea unguium: Secondary | ICD-10-CM | POA: Diagnosis not present

## 2023-07-20 DIAGNOSIS — M79674 Pain in right toe(s): Secondary | ICD-10-CM | POA: Diagnosis not present

## 2023-07-20 DIAGNOSIS — L84 Corns and callosities: Secondary | ICD-10-CM | POA: Diagnosis not present

## 2023-07-22 ENCOUNTER — Encounter: Payer: Self-pay | Admitting: Podiatry

## 2023-07-22 NOTE — Progress Notes (Signed)
  Subjective:  Patient ID: Nicholas Patton, male    DOB: 05-08-1934,  MRN: [id number]  89 y.o. male presents to clinic with  at risk foot care with history of diabetic neuropathy and corn(s) left foot and painful mycotic toenails that are difficult to trim. Painful toenails interfere with ambulation. Aggravating factors include wearing enclosed shoe gear. Pain is relieved with periodic professional debridement. Painful corns are aggravated when weightbearing when wearing enclosed shoe gear. Pain is relieved with periodic professional debridement.  Chief Complaint  Patient presents with   Nail Problem    Pt is here for [facility] unsure of last A1C PCP is Dr Autry Legions and LOV was in April.     New problem(s): None   PCP is Roslyn Coombe, MD.  Allergies  Allergen Reactions   Amlodipine  Swelling   Cymbalta  [Duloxetine  Hcl] Other (See Comments)   Oxycodone  Other (See Comments)    Review of Systems: Negative except as noted in the HPI.   Objective:  Nicholas Patton is a pleasant 89 y.o. male WD, WN in NAD. AAO x 3.  Vascular Examination: Vascular status intact b/l with palpable pedal pulses. CFT immediate b/l. No pain with calf compression b/l. Skin temperature gradient WNL b/l. Trace edema noted BLE. No cyanosis or clubbing noted b/l LE.  Neurological Examination: Pt has subjective symptoms of neuropathy. Protective sensation decreased with 10 gram monofilament b/l.  Dermatological Examination: Pedal skin with normal turgor, texture and tone b/l. Toenails 1-5 b/l thick, discolored, elongated with subungual debris and pain on dorsal palpation. Hyperkeratotic lesion(s) left great toe and L 2nd toe.  No erythema, no edema, no drainage, no fluctuance.  Musculoskeletal Examination: Muscle strength 5/5 to b/l LE. Muscle strength 5/5 to all lower extremity muscle groups bilaterally. HAV with bunion bilaterally and hammertoes 2-5 b/l.  Radiographs: None  Last A1c:      Latest Ref Rng & Units 04/23/2023    10:47 AM 10/21/2022   11:09 AM  Hemoglobin A1C  Hemoglobin-A1c 4.6 - 6.5 % 7.4  8.2      Assessment:   1. Pain due to onychomycosis of toenails of both feet   2. Corns   3. Diabetic peripheral neuropathy associated with type 2 diabetes mellitus (HCC)     Plan:  Consent given for treatment. Patient examined. All patient's and/or POA's questions/concerns addressed on today's visit. Mycotic toenails 1-5 debrided in length and girth without incident. Corn(s) left great toe and L 2nd toe pared with sharp debridement without incident. Continue foot and shoe inspections daily. Monitor blood glucose per PCP/Endocrinologist's recommendations.Continue soft, supportive shoe gear daily. Report any pedal injuries to medical professional. Call office if there are any quesitons/concerns. -Patient/POA to call should there be question/concern in the interim.  Return in about 3 months (around 10/20/2023).  Luella Sager, DPM      [facility] LOCATION: [address] [place] [address]                   Office [phone]   [facility] LOCATION: [address] [place], [place] [address] Office [phone]

## 2023-07-28 DIAGNOSIS — J9601 Acute respiratory failure with hypoxia: Secondary | ICD-10-CM | POA: Diagnosis not present

## 2023-07-28 DIAGNOSIS — E1142 Type 2 diabetes mellitus with diabetic polyneuropathy: Secondary | ICD-10-CM | POA: Diagnosis not present

## 2023-07-28 DIAGNOSIS — J439 Emphysema, unspecified: Secondary | ICD-10-CM | POA: Diagnosis not present

## 2023-07-28 DIAGNOSIS — J849 Interstitial pulmonary disease, unspecified: Secondary | ICD-10-CM | POA: Diagnosis not present

## 2023-07-28 DIAGNOSIS — J4489 Other specified chronic obstructive pulmonary disease: Secondary | ICD-10-CM | POA: Diagnosis not present

## 2023-07-28 DIAGNOSIS — I1310 Hypertensive heart and chronic kidney disease without heart failure, with stage 1 through stage 4 chronic kidney disease, or unspecified chronic kidney disease: Secondary | ICD-10-CM | POA: Diagnosis not present

## 2023-08-04 DIAGNOSIS — H35371 Puckering of macula, right eye: Secondary | ICD-10-CM | POA: Diagnosis not present

## 2023-08-04 DIAGNOSIS — E119 Type 2 diabetes mellitus without complications: Secondary | ICD-10-CM | POA: Diagnosis not present

## 2023-08-04 DIAGNOSIS — H40011 Open angle with borderline findings, low risk, right eye: Secondary | ICD-10-CM | POA: Diagnosis not present

## 2023-08-04 DIAGNOSIS — H4322 Crystalline deposits in vitreous body, left eye: Secondary | ICD-10-CM | POA: Diagnosis not present

## 2023-08-04 LAB — HM DIABETES EYE EXAM

## 2023-08-10 ENCOUNTER — Other Ambulatory Visit: Payer: Self-pay | Admitting: Internal Medicine

## 2023-08-23 ENCOUNTER — Ambulatory Visit: Payer: Medicare Other | Admitting: Internal Medicine

## 2023-09-01 ENCOUNTER — Ambulatory Visit (INDEPENDENT_AMBULATORY_CARE_PROVIDER_SITE_OTHER): Admitting: Podiatry

## 2023-09-01 DIAGNOSIS — M2042 Other hammer toe(s) (acquired), left foot: Secondary | ICD-10-CM | POA: Diagnosis not present

## 2023-09-01 DIAGNOSIS — M7752 Other enthesopathy of left foot: Secondary | ICD-10-CM | POA: Diagnosis not present

## 2023-09-01 NOTE — Progress Notes (Signed)
 Subjective:  Patient ID: Nicholas Patton, male    DOB: [birth year],  MRN: [id number]  Chief Complaint  Patient presents with   Numbness    Left foot  Pt stated that the top of his left foot is numb     89 y.o. male presents with the above complaint.  Patient presents with left third digit hammertoe contracture with retrograde pressure to the third metatarsophalangeal joint leading to capsulitis.  Patient states been present for quite some time is causing him discomfort while ambulating has not seen anyone else prior to seeing me pain scale 7 out of 10 dull aching nature would like to discuss treatment options for this.  Patient   Review of Systems: Negative except as noted in the HPI. Denies N/V/F/Ch.  Past Medical History:  Diagnosis Date   Anemia    Arthritis    Cataract    REMOVED BILATERAL   Colon polyps    COPD (chronic obstructive pulmonary disease) (HCC)    DIABETES MELLITUS, TYPE II    takes Metformin  and Glimepiride  daily   Diverticulosis    GERD (gastroesophageal reflux disease)    takes Protonix  daily   History of colon polyps    HYPERLIPIDEMIA    takes Atorvastatin  daily   HYPERTENSION    takes Amlodipine  and Hyzaar daily   Ileus following gastrointestinal surgery (HCC) 07/22/2012   Joint pain    Peripheral neuropathy    Peripheral neuropathy    PERIPHERAL NEUROPATHY, FEET 10/06/2007   Renal disorder    VITAMIN B12 DEFICIENCY 09/06/2008   VITAMIN D  DEFICIENCY 01/16/2010   takes Vitamin D  daily    Current Outpatient Medications:    albuterol  (VENTOLIN  HFA) 108 (90 Base) MCG/ACT inhaler, INHALE 2 PUFFS BY MOUTH EVERY 6 HOURS AS NEEDED FOR WHEEZING FOR SHORTNESS OF BREATH, Disp: 18 g, Rfl: 2   allopurinol  (ZYLOPRIM ) 100 MG tablet, Take 1 tablet (100 mg total) by mouth daily., Disp: 90 tablet, Rfl: 1   atorvastatin  (LIPITOR) 40 MG tablet, Take 1 tablet (40 mg total) by mouth daily., Disp: 90 tablet, Rfl: 3   betamethasone  dipropionate 0.05 % cream, Apply  topically 2 (two) times daily., Disp: 30 g, Rfl: 1   BREO ELLIPTA  200-25 MCG/ACT AEPB, USE 1 INHALATION BY MOUTH ONCE  DAILY AT THE SAME TIME EACH DAY, Disp: 180 each, Rfl: 3   carvedilol  (COREG ) 12.5 MG tablet, TAKE 1 TABLET BY MOUTH TWICE  DAILY WITH MEALS, Disp: 180 tablet, Rfl: 3   cholecalciferol  (VITAMIN D3) 25 MCG (1000 UNIT) tablet, Take 1,000 Units by mouth daily. 06/02/23: Reports during TOC call taking OTC 2,000 Units every day, Disp: , Rfl:    doxazosin  (CARDURA ) 2 MG tablet, Take 1 tablet (2 mg total) by mouth daily., Disp: 90 tablet, Rfl: 3   ferrous sulfate 325 (65 FE) MG EC tablet, Take 325 mg by mouth 3 (three) times daily with meals. 06/18/23: Daughter reports nephrology provider prescribed iron 325 mg OTC at time of office visit on 06/16/23, Disp: , Rfl:    finasteride  (PROSCAR ) 5 MG tablet, TAKE 1 TABLET BY MOUTH DAILY, Disp: 90 tablet, Rfl: 3   furosemide  (LASIX ) 40 MG tablet, Take 1 tab by mouth in the AM every day, and 1 tab by mouth in the afternoon for persistent swelling as needed, Disp: 180 tablet, Rfl: 3   glimepiride  (AMARYL ) 2 MG tablet, TAKE 2 TABLETS BY MOUTH DAILY  BEFORE BREAKFAST, Disp: 180 tablet, Rfl: 3   levothyroxine  (SYNTHROID ) 50 MCG tablet,  Take 1 tablet (50 mcg total) by mouth daily., Disp: 90 tablet, Rfl: 3   losartan  (COZAAR ) 100 MG tablet, TAKE 1 TABLET BY MOUTH DAILY, Disp: 90 tablet, Rfl: 3   pantoprazole  (PROTONIX ) 40 MG tablet, Take 1 tablet (40 mg total) by mouth daily., Disp: 90 tablet, Rfl: 3   pioglitazone  (ACTOS ) 30 MG tablet, Take 30 mg by mouth daily., Disp: , Rfl:    vitamin B-12 (CYANOCOBALAMIN ) 100 MCG tablet, Take 100 mcg by mouth daily., Disp: , Rfl:   Social History   Tobacco Use  Smoking Status Former   Current packs/day: 0.00   Average packs/day: 1 pack/day for 31.0 years (31.0 ttl pk-yrs)   Types: Cigarettes   Start date: 12/22/1952   Quit date: 12/23/1983   Years since quitting: 39.7   Passive exposure: Never  Smokeless  Tobacco Never    Allergies  Allergen Reactions   Amlodipine  Swelling   Cymbalta  [Duloxetine  Hcl] Other (See Comments)   Oxycodone  Other (See Comments)   Objective:  There were no vitals filed for this visit. There is no height or weight on file to calculate BMI. Constitutional Well developed. Well nourished.  Vascular Dorsalis pedis pulses palpable bilaterally. Posterior tibial pulses palpable bilaterally. Capillary refill normal to all digits.  No cyanosis or clubbing noted. Pedal hair growth normal.  Neurologic Normal speech. Oriented to person, place, and time. Epicritic sensation to light touch grossly present bilaterally.  Dermatologic Nails well groomed and normal in appearance. No open wounds. No skin lesions.  Orthopedic: Left third digit hammertoe contracture with retrograde pressure into the metatarsophalangeal joint of the third.  Pain on palpation left third metatarsophalangeal joint pain with range of motion of the joint no deep intra-articular pain noted no crepitus noted   Radiographs: None Assessment:   1. Hammertoe of left foot   2. Capsulitis of metatarsophalangeal (MTP) joint of left foot    Plan:  Patient was evaluated and treated and all questions answered.  Left third metatarsophalangeal joint capsulitis with underlying hammertoe contracture - All questions and concerns were discussed with the patient in extensive detail given the amount of pain that he is experiencing he will benefit from a steroid injection of decreasing inflammatory component associate with pain.  Patient agrees with plan elected proceed with steroid injection -A steroid injection was performed at left third MTP using 1% plain Lidocaine  and 10 mg of Kenalog . This was well tolerated. - Shoe gear modification discussed -   No follow-ups on file.

## 2023-09-06 DIAGNOSIS — N184 Chronic kidney disease, stage 4 (severe): Secondary | ICD-10-CM | POA: Diagnosis not present

## 2023-09-07 ENCOUNTER — Other Ambulatory Visit: Payer: Self-pay

## 2023-09-07 ENCOUNTER — Other Ambulatory Visit: Payer: Self-pay | Admitting: Internal Medicine

## 2023-09-15 DIAGNOSIS — I129 Hypertensive chronic kidney disease with stage 1 through stage 4 chronic kidney disease, or unspecified chronic kidney disease: Secondary | ICD-10-CM | POA: Diagnosis not present

## 2023-09-15 DIAGNOSIS — R600 Localized edema: Secondary | ICD-10-CM | POA: Diagnosis not present

## 2023-09-15 DIAGNOSIS — E559 Vitamin D deficiency, unspecified: Secondary | ICD-10-CM | POA: Diagnosis not present

## 2023-09-15 DIAGNOSIS — N184 Chronic kidney disease, stage 4 (severe): Secondary | ICD-10-CM | POA: Diagnosis not present

## 2023-09-15 DIAGNOSIS — M109 Gout, unspecified: Secondary | ICD-10-CM | POA: Diagnosis not present

## 2023-09-15 DIAGNOSIS — N2581 Secondary hyperparathyroidism of renal origin: Secondary | ICD-10-CM | POA: Diagnosis not present

## 2023-09-15 DIAGNOSIS — D631 Anemia in chronic kidney disease: Secondary | ICD-10-CM | POA: Diagnosis not present

## 2023-09-16 ENCOUNTER — Other Ambulatory Visit: Payer: Self-pay | Admitting: Internal Medicine

## 2023-09-28 ENCOUNTER — Encounter: Payer: Self-pay | Admitting: Podiatry

## 2023-09-28 ENCOUNTER — Ambulatory Visit (INDEPENDENT_AMBULATORY_CARE_PROVIDER_SITE_OTHER): Admitting: Podiatry

## 2023-09-28 DIAGNOSIS — M79674 Pain in right toe(s): Secondary | ICD-10-CM

## 2023-09-28 DIAGNOSIS — M79675 Pain in left toe(s): Secondary | ICD-10-CM | POA: Diagnosis not present

## 2023-09-28 DIAGNOSIS — B351 Tinea unguium: Secondary | ICD-10-CM

## 2023-09-28 DIAGNOSIS — E1142 Type 2 diabetes mellitus with diabetic polyneuropathy: Secondary | ICD-10-CM | POA: Diagnosis not present

## 2023-10-03 ENCOUNTER — Encounter: Payer: Self-pay | Admitting: Podiatry

## 2023-10-03 NOTE — Progress Notes (Signed)
  Subjective:  Patient ID: Nicholas Patton, male    DOB: Jul 21, 1934,  MRN: [id number]  Nicholas Patton presents to clinic today for at risk foot care with history of diabetic neuropathy and painful thick toenails that are difficult to trim. Pain interferes with ambulation. Aggravating factors include wearing enclosed shoe gear. Pain is relieved with periodic professional debridement. He states his interdigital corns are not bothering him today. Chief Complaint  Patient presents with   Diabetes    [facility] NIDDM A1C 7.4 toenail trim. LOV with PCP 06/2023.   New problem(s): None.   PCP is Norleen Lynwood ORN, MD.  Allergies  Allergen Reactions   Amlodipine  Swelling   Cymbalta  [Duloxetine  Hcl] Other (See Comments)   Oxycodone  Other (See Comments)    Review of Systems: Negative except as noted in the HPI.  Objective: No changes noted in today's physical examination. There were no vitals filed for this visit. Nicholas Patton is a pleasant 89 y.o. male WD, WN in NAD. AAO x 3.  Vascular Examination: Capillary refill time immediate b/l. Palpable pedal pulses. Pedal hair absent b/l. No pain with calf compression b/l. Skin temperature gradient WNL b/l. No cyanosis or clubbing b/l. No ischemia or gangrene noted b/l. Trace edema noted BLE.  Neurological Examination: Pt has subjective symptoms of neuropathy. Protective sensation decreased with 10 gram monofilament b/l.  Dermatological Examination: Pedal skin with normal turgor, texture and tone b/l.  No open wounds. No interdigital macerations.   Toenails 1-5 b/l thick, discolored, elongated with subungual debris and pain on dorsal palpation.   No hyperkeratotic nor porokeratotic lesions.  Musculoskeletal Examination: Muscle strength 5/5 to all lower extremity muscle groups bilaterally. HAV with bunion deformity noted b/l LE. Hammertoe(s) 2-5 b/l.Nicholas Patton No pain, crepitus or joint limitation noted with ROM b/l LE.  Patient ambulates independently without  assistive aids.  Radiographs: None  Last A1c:      Latest Ref Rng & Units 04/23/2023   10:47 AM 10/21/2022   11:09 AM  Hemoglobin A1C  Hemoglobin-A1c 4.6 - 6.5 % 7.4  8.2    Assessment/Plan: 1. Pain due to onychomycosis of toenails of both feet   2. Diabetic peripheral neuropathy associated with type 2 diabetes mellitus (HCC)   Patient was evaluated and treated. All patient's and/or POA's questions/concerns addressed on today's visit. Toenails 1-5 debrided in length and girth without incident. Continue soft, supportive shoe gear daily. Treatment was provided by assistant Nicholas Patton under my supervision. Report any pedal injuries to medical professional. Call office if there are any questions/concerns.  Return in about 3 months (around 12/29/2023).  Nicholas Patton, DPM      [facility] LOCATION: [address] [place] [address]                   Office [phone]   [facility] LOCATION: [address] [place], [place] [address] Office [phone]

## 2023-10-13 DIAGNOSIS — H26492 Other secondary cataract, left eye: Secondary | ICD-10-CM | POA: Diagnosis not present

## 2023-10-13 DIAGNOSIS — H4322 Crystalline deposits in vitreous body, left eye: Secondary | ICD-10-CM | POA: Diagnosis not present

## 2023-10-13 DIAGNOSIS — H40011 Open angle with borderline findings, low risk, right eye: Secondary | ICD-10-CM | POA: Diagnosis not present

## 2023-10-13 DIAGNOSIS — H35371 Puckering of macula, right eye: Secondary | ICD-10-CM | POA: Diagnosis not present

## 2023-10-13 DIAGNOSIS — E119 Type 2 diabetes mellitus without complications: Secondary | ICD-10-CM | POA: Diagnosis not present

## 2023-10-13 DIAGNOSIS — H43812 Vitreous degeneration, left eye: Secondary | ICD-10-CM | POA: Diagnosis not present

## 2023-10-20 DIAGNOSIS — R3915 Urgency of urination: Secondary | ICD-10-CM | POA: Diagnosis not present

## 2023-10-20 DIAGNOSIS — R310 Gross hematuria: Secondary | ICD-10-CM | POA: Diagnosis not present

## 2023-10-20 DIAGNOSIS — R351 Nocturia: Secondary | ICD-10-CM | POA: Diagnosis not present

## 2023-10-20 DIAGNOSIS — N401 Enlarged prostate with lower urinary tract symptoms: Secondary | ICD-10-CM | POA: Diagnosis not present

## 2023-10-20 DIAGNOSIS — R350 Frequency of micturition: Secondary | ICD-10-CM | POA: Diagnosis not present

## 2023-10-21 ENCOUNTER — Ambulatory Visit: Payer: Medicare Other | Admitting: Internal Medicine

## 2023-10-26 ENCOUNTER — Encounter: Payer: Self-pay | Admitting: Pulmonary Disease

## 2023-10-26 ENCOUNTER — Ambulatory Visit (INDEPENDENT_AMBULATORY_CARE_PROVIDER_SITE_OTHER): Admitting: Pulmonary Disease

## 2023-10-26 VITALS — BP 139/74 | HR 48 | Ht 74.0 in | Wt 191.0 lb

## 2023-10-26 DIAGNOSIS — J439 Emphysema, unspecified: Secondary | ICD-10-CM

## 2023-10-26 DIAGNOSIS — R0609 Other forms of dyspnea: Secondary | ICD-10-CM

## 2023-10-26 DIAGNOSIS — J9611 Chronic respiratory failure with hypoxia: Secondary | ICD-10-CM

## 2023-10-26 DIAGNOSIS — J45909 Unspecified asthma, uncomplicated: Secondary | ICD-10-CM | POA: Diagnosis not present

## 2023-10-26 DIAGNOSIS — Z87891 Personal history of nicotine dependence: Secondary | ICD-10-CM | POA: Diagnosis not present

## 2023-10-26 NOTE — Progress Notes (Signed)
 Patient ID: Nicholas Patton, male    DOB: 04-06-1934, 89 y.o.   MRN: [id number]  Chief Complaint  Patient presents with   Follow-up    DOE    Referring provider: Norleen Lynwood ORN, MD  HPI:   Nicholas Patton is a 89 y.o. man with reported COPD with spirometry without fixed obstruction but with significant bronchodilator response whom we are seeing in follow up for asthma, DOE as well as  chronic hypoxic respiratory failure.    Seems be doing better.  Brighter today.  Smiling.  Was able to get a POC at last visit and said the tanks.  I suspect he is using his oxygen  more.  As such he is able to be more active, breathing a bit improved with oxygen .  This is encouraging.  HPI at initial visit: Has had some mild dyspnea exertion going on some time now.  At least months to years.  He has been told he has COPD in the past but has never had spirometry.  Shortness of breath worse walking longer distances or on inclines.  Rest resolve symptoms.  No real shortness of breath at rest.  He has hayfever, seasonal allergies regularly.  Will use medicines to treat this when bad.  No recurrent pneumonias in the past no real bad bouts of bronchitis.  No asthma or exertional limitation as a child.  No syncope or presyncope.  Reportedly has occasionally received prednisone  for some breathing difficulties in the past.  Is been at least 2 years or more since last use.  Spirometry recently demonstrates ratio at 68 to 69% but given his age this is over 90% predicted and within normal limits.  Significant bronchodilator response in both FEV1 and FVC.  CT scan 05/2019 personally reviewed and interpreted as diffuse significant upper lobe predominant emphysema without other significant abnormality.  PMH: Diabetes, hypertension Surgical history: Cataracts, neck surgery/fusion next knee replacement, cholecystectomy Family history: No history of lung cancer Social history: Former smoker, quit late 80s, lives in  [place] / Pulmonary Flowsheets:   ACT:      No data to display          MMRC: mMRC Dyspnea Scale mMRC Score  11/18/2020  4:28 PM 3  05/15/2020  1:35 PM 0    Epworth:      No data to display          Tests:   FENO:  No results found for: NITRICOXIDE  PFT:    Latest Ref Rng & Units 10/04/2019   10:53 AM  PFT Results  FVC-Pre L 3.21  P  FVC-Predicted Pre % 78  P  FVC-Post L 3.67  P  FVC-Predicted Post % 90  P  Pre FEV1/FVC % % 68  P  Post FEV1/FCV % % 69  P  FEV1-Pre L 2.18  P  FEV1-Predicted Pre % 73  P  FEV1-Post L 2.55  P  DLCO uncorrected ml/min/mmHg 12.86  P  DLCO UNC% % 49  P  DLCO corrected ml/min/mmHg 12.94  P  DLCO COR %Predicted % 49  P  DLVA Predicted % 50  P  TLC L 7.34  P  TLC % Predicted % 95  P  RV % Predicted % 120  P    P Preliminary result  Personally reviewed and interpreted as normal spirometry with significant bronchodilator response.  TLC within normal limits, RV gas trapping.  DLCO severely reduced.  WALK:     07/19/2023  9:11 AM  SIX MIN WALK  Tech Comments: Patient was able to make 2 laps then O2 dropped down to 87% started going down placed on 2L to start with ended being on 3L to maintain 97%    Imaging: No results found.  Lab Results:  CBC    Component Value Date/Time   WBC 5.2 06/01/2023 0544   RBC 3.15 (L) 06/01/2023 0544   HGB 9.2 (L) 06/01/2023 0544   HGB 12.9 (L) 11/10/2022 0944   HCT 28.5 (L) 06/01/2023 0544   PLT 202 06/01/2023 0544   PLT 143 (L) 11/10/2022 0944   MCV 90.5 06/01/2023 0544   MCH 29.2 06/01/2023 0544   MCHC 32.3 06/01/2023 0544   RDW 15.0 06/01/2023 0544   LYMPHSABS 0.8 05/30/2023 1348   MONOABS 0.4 05/30/2023 1348   EOSABS 0.1 05/30/2023 1348   BASOSABS 0.0 05/30/2023 1348    BMET    Component Value Date/Time   NA 137 06/01/2023 0544   NA 142 03/08/2023 0933   K 3.9 06/01/2023 0544   CL 108 06/01/2023 0544   CO2 21 (L) 06/01/2023 0544   GLUCOSE 193 (H)  06/01/2023 0544   BUN 65 (H) 06/01/2023 0544   BUN 50 (H) 03/08/2023 0933   CREATININE 2.79 (H) 06/01/2023 0544   CREATININE 2.27 (H) 11/10/2022 0944   CREATININE 1.68 (H) 10/04/2019 1115   CALCIUM  8.6 (L) 06/01/2023 0544   GFRNONAA 21 (L) 06/01/2023 0544   GFRNONAA 27 (L) 11/10/2022 0944   GFRNONAA 37 (L) 10/04/2019 1115   GFRAA 41 (L) 10/25/2019 1052   GFRAA 43 (L) 10/04/2019 1115    BNP    Component Value Date/Time   BNP 386.8 (H) 05/30/2023 1348    ProBNP No results found for: PROBNP  Specialty Problems       Pulmonary Problems   Allergic rhinitis   Qualifier: Diagnosis of  By: Norleen MD, Lynwood ORN       Acute upper respiratory infection   Dyspnea   Left-sided nosebleed   Asthma   Acute cough   Acute asthmatic bronchitis   COPD with emphysema (HCC)   Nosebleed   Acute hypoxic respiratory failure (HCC)   Chronic hypoxemic respiratory failure (HCC)    Allergies  Allergen Reactions   Amlodipine  Swelling   Cymbalta  [Duloxetine  Hcl] Other (See Comments)   Oxycodone  Other (See Comments)    Immunization History  Administered Date(s) Administered   Fluad Quad(high Dose 65+) 11/09/2018, 11/30/2019, 12/14/2020, 04/22/2022   Fluad Trivalent(High Dose 65+) 04/23/2023   Influenza Whole 11/21/2015   Influenza, High Dose Seasonal PF 01/24/2013, 12/01/2016, 11/22/2017   Influenza,inj,Quad PF,6+ Mos 12/08/2013   Influenza-Unspecified 02/08/2015   PFIZER(Purple Top)SARS-COV-2 Vaccination 05/03/2019, 05/04/2019, 12/23/2019, 10/10/2020   Pneumococcal Conjugate-13 03/22/2013   Pneumococcal Polysaccharide-23 01/10/2016   Td 10/05/2014   Zoster Recombinant(Shingrix) 07/25/2021, 01/19/2022    Past Medical History:  Diagnosis Date   Anemia    Arthritis    Cataract    REMOVED BILATERAL   Colon polyps    COPD (chronic obstructive pulmonary disease) (HCC)    DIABETES MELLITUS, TYPE II    takes Metformin  and Glimepiride  daily   Diverticulosis    GERD  (gastroesophageal reflux disease)    takes Protonix  daily   History of colon polyps    HYPERLIPIDEMIA    takes Atorvastatin  daily   HYPERTENSION    takes Amlodipine  and Hyzaar daily   Ileus following gastrointestinal surgery (HCC) 07/22/2012   Joint pain    Peripheral neuropathy  Peripheral neuropathy    PERIPHERAL NEUROPATHY, FEET 10/06/2007   Renal disorder    VITAMIN B12 DEFICIENCY 09/06/2008   VITAMIN D  DEFICIENCY 01/16/2010   takes Vitamin D  daily    Tobacco History: Social History   Tobacco Use  Smoking Status Former   Current packs/day: 0.00   Average packs/day: 1 pack/day for 31.0 years (31.0 ttl pk-yrs)   Types: Cigarettes   Start date: 12/22/1952   Quit date: 12/23/1983   Years since quitting: 39.8   Passive exposure: Never  Smokeless Tobacco Never   Counseling given: Not Answered    Continue to not smoke  Outpatient Encounter Medications as of 10/26/2023  Medication Sig   albuterol  (VENTOLIN  HFA) 108 (90 Base) MCG/ACT inhaler INHALE 2 PUFFS BY MOUTH EVERY 6 HOURS AS NEEDED FOR WHEEZING FOR SHORTNESS OF BREATH   allopurinol  (ZYLOPRIM ) 100 MG tablet Take 1 tablet (100 mg total) by mouth daily.   atorvastatin  (LIPITOR) 40 MG tablet Take 1 tablet (40 mg total) by mouth daily.   betamethasone  dipropionate 0.05 % cream Apply topically 2 (two) times daily.   BREO ELLIPTA  200-25 MCG/ACT AEPB USE 1 INHALATION BY MOUTH ONCE  DAILY AT THE SAME TIME EACH DAY   carvedilol  (COREG ) 12.5 MG tablet TAKE 1 TABLET BY MOUTH TWICE  DAILY WITH MEALS   cholecalciferol  (VITAMIN D3) 25 MCG (1000 UNIT) tablet Take 1,000 Units by mouth daily. 06/02/23: Reports during TOC call taking OTC 2,000 Units every day   doxazosin  (CARDURA ) 2 MG tablet Take 1 tablet (2 mg total) by mouth daily.   finasteride  (PROSCAR ) 5 MG tablet TAKE 1 TABLET BY MOUTH DAILY   furosemide  (LASIX ) 40 MG tablet Take 1 tab by mouth in the AM every day, and 1 tab by mouth in the afternoon for persistent swelling  as needed   glimepiride  (AMARYL ) 2 MG tablet TAKE 2 TABLETS BY MOUTH DAILY  BEFORE BREAKFAST   levothyroxine  (SYNTHROID ) 50 MCG tablet TAKE 1 TABLET BY MOUTH DAILY   losartan  (COZAAR ) 100 MG tablet TAKE 1 TABLET BY MOUTH DAILY   pantoprazole  (PROTONIX ) 40 MG tablet Take 1 tablet (40 mg total) by mouth daily.   pioglitazone  (ACTOS ) 30 MG tablet TAKE 1 TABLET BY MOUTH DAILY   vitamin B-12 (CYANOCOBALAMIN ) 100 MCG tablet Take 100 mcg by mouth daily.   ferrous sulfate 325 (65 FE) MG EC tablet Take 325 mg by mouth 3 (three) times daily with meals. 06/18/23: Daughter reports nephrology provider prescribed iron 325 mg OTC at time of office visit on 06/16/23 (Patient not taking: Reported on 10/26/2023)   No facility-administered encounter medications on file as of 10/26/2023.     Review of Systems  Review of Systems  N/a Physical Exam  BP 139/74 Comment: Rechecked due to elevated BP at initial reading.  Pulse (!) 48   Ht 6' 2 (1.88 m)   Wt 191 lb (86.6 kg)   SpO2 96% Comment: 2L pulse o2  BMI 24.52 kg/m   Wt Readings from Last 5 Encounters:  10/26/23 191 lb (86.6 kg)  07/20/23 186 lb (84.4 kg)  07/19/23 186 lb (84.4 kg)  07/07/23 184 lb 8 oz (83.7 kg)  06/16/23 187 lb (84.8 kg)    BMI Readings from Last 5 Encounters:  10/26/23 24.52 kg/m  07/20/23 23.88 kg/m  07/19/23 23.88 kg/m  07/07/23 23.69 kg/m  06/16/23 24.01 kg/m     Physical Exam General: Well-appearing, in no acute distress Eyes: EOMI, no icterus Neck: Supple, no JVP appreciated sitting  upright Respiratory: Clear to auscultation bilaterally, normal work of breathing on room air Extremities: Warm, no edema MSK: No synovitis, no joint effusions Neuro: Normal gait, no weakness Psych: Normal mood, full affect   Assessment & Plan:    DOE: PFTs are suggestive of asthma and this is likely a driver of symptoms.  Some of deconditioning given he admits to being less active.  Improved with ICS/LABA therapy but still  present. Additionally, DLCO is severely reduced which is related to emphysema seen on CT but also concern for contribution of pulmonary HTN given elevated PA pressures seen on TTE in 2016. Repeat TTE 10/2019 with mildly elevated PASP, RV enlargement but normal RV function and size.  Also developing element of deconditioning given his lack of activity in the preceding months.  Escalated to Trelegy fall 2022 without improvement.  Subsequent improvement after resuming Ranell Winter 2022.  With what seemed to be less improvement over the next several months.  Escalated to Breztri  follow-up 2023 with worsening symptoms.  Tried Trelegy, no improvement.  CT high-resolution 03/2022 with emphysema, no other concerning findings.  VQ scan, negative for acute or chronic PE in 2025.  Suspect his shortness of breath related to asthma/emphysema is being maximally treated currently.  In addition, likely pulmonary hypertension contributing.  However given his significant emphysema, lack of ILD, he is a poor candidate for systemic and inhaled pulmonary vasodilators.  Given very high likelihood of worsening decompensation with treatment and unlikely improvement, no further workup, diagnostic test for pulmonary hypertension are recommended.   Asthma: Had improved with ICS/LABA.  Encouraged increased exercise.  Will response to inhalers historically.  Cough worsened with Trelegy.  Continue Breo.   Return in about 6 months (around 04/27/2024) for f/u Dr. Annella.   Donnice JONELLE Annella, MD 10/26/2023

## 2023-10-26 NOTE — Patient Instructions (Signed)
 Nice to see you again  I am glad the oxygen  is helping  No change in the medication  Return to clinic in 6 months or sooner as needed with Dr. Annella

## 2023-11-17 ENCOUNTER — Ambulatory Visit

## 2023-11-18 ENCOUNTER — Ambulatory Visit (INDEPENDENT_AMBULATORY_CARE_PROVIDER_SITE_OTHER)

## 2023-11-18 VITALS — Ht 74.0 in | Wt 191.0 lb

## 2023-11-18 DIAGNOSIS — Z0000 Encounter for general adult medical examination without abnormal findings: Secondary | ICD-10-CM

## 2023-11-18 NOTE — Progress Notes (Signed)
 Subjective:   Nicholas Patton is a 89 y.o. who presents for a Medicare Wellness preventive visit.  As a reminder, Annual Wellness Visits don't include a physical exam, and some assessments may be limited, especially if this visit is performed virtually. We may recommend an in-person follow-up visit with your provider if needed.  Visit Complete: Virtual I connected with  Nicholas Patton on 11/18/23 by a audio enabled telemedicine application and verified that I am speaking with the correct person using two identifiers.  Patient Location: Home  Provider Location: Office/Clinic  I discussed the limitations of evaluation and management by telemedicine. The patient expressed understanding and agreed to proceed.  Vital Signs: Because this visit was a virtual/telehealth visit, some criteria may be missing or patient reported. Any vitals not documented were not able to be obtained and vitals that have been documented are patient reported.  VideoDeclined- This patient declined Librarian, academic. Therefore the visit was completed with audio only.  Persons Participating in Visit: Patient.  AWV Questionnaire: Yes: Patient Medicare AWV questionnaire was completed by the patient on 11/17/2023; I have confirmed that all information answered by patient is correct and no changes since this date.  Cardiac Risk Factors include: advanced age (>36men, >83 women);hypertension;dyslipidemia;diabetes mellitus;Other (see comment), Risk factor comments: BPH, CKD stage4     Objective:    Today's Vitals   11/18/23 1043  Weight: 191 lb (86.6 kg)  Height: 6' 2 (1.88 m)   Body mass index is 24.52 kg/m.     11/18/2023   10:52 AM 05/30/2023    5:19 PM 01/04/2023    9:23 AM 11/16/2022   11:13 AM 08/03/2022   12:07 PM 05/27/2022    9:26 AM 11/06/2021   10:11 AM  Advanced Directives  Does Patient Have a Medical Advance Directive? No No No Yes No Yes Yes  Type of Loss adjuster, chartered of [place];Living will Healthcare Power of [place];Living will  Does patient want to make changes to medical advance directive?    No - Patient declined   No - Patient declined  Copy of Healthcare Power of Attorney in Chart?       No - copy requested  Would patient like information on creating a medical advance directive?  No - Patient declined No - Patient declined  No - Patient declined      Current Medications (verified) Outpatient Encounter Medications as of 11/18/2023  Medication Sig   albuterol  (VENTOLIN  HFA) 108 (90 Base) MCG/ACT inhaler INHALE 2 PUFFS BY MOUTH EVERY 6 HOURS AS NEEDED FOR WHEEZING FOR SHORTNESS OF BREATH   allopurinol  (ZYLOPRIM ) 100 MG tablet Take 1 tablet (100 mg total) by mouth daily.   atorvastatin  (LIPITOR) 40 MG tablet Take 1 tablet (40 mg total) by mouth daily.   betamethasone  dipropionate 0.05 % cream Apply topically 2 (two) times daily.   BREO ELLIPTA  200-25 MCG/ACT AEPB USE 1 INHALATION BY MOUTH ONCE  DAILY AT THE SAME TIME EACH DAY   carvedilol  (COREG ) 12.5 MG tablet TAKE 1 TABLET BY MOUTH TWICE  DAILY WITH MEALS   cholecalciferol  (VITAMIN D3) 25 MCG (1000 UNIT) tablet Take 1,000 Units by mouth daily. 06/02/23: Reports during TOC call taking OTC 2,000 Units every day   doxazosin  (CARDURA ) 2 MG tablet Take 1 tablet (2 mg total) by mouth daily.   finasteride  (PROSCAR ) 5 MG tablet TAKE 1 TABLET BY MOUTH DAILY   furosemide  (LASIX ) 40  MG tablet Take 1 tab by mouth in the AM every day, and 1 tab by mouth in the afternoon for persistent swelling as needed   glimepiride  (AMARYL ) 2 MG tablet TAKE 2 TABLETS BY MOUTH DAILY  BEFORE BREAKFAST   levothyroxine  (SYNTHROID ) 50 MCG tablet TAKE 1 TABLET BY MOUTH DAILY   losartan  (COZAAR ) 100 MG tablet TAKE 1 TABLET BY MOUTH DAILY   pantoprazole  (PROTONIX ) 40 MG tablet Take 1 tablet (40 mg total) by mouth daily.   pioglitazone  (ACTOS ) 30 MG tablet TAKE 1 TABLET BY MOUTH DAILY    vitamin B-12 (CYANOCOBALAMIN ) 100 MCG tablet Take 100 mcg by mouth daily.   ferrous sulfate 325 (65 FE) MG EC tablet Take 325 mg by mouth 3 (three) times daily with meals. 06/18/23: Daughter reports nephrology provider prescribed iron 325 mg OTC at time of office visit on 06/16/23 (Patient not taking: Reported on 11/18/2023)   No facility-administered encounter medications on file as of 11/18/2023.    Allergies (verified) Amlodipine , Cymbalta  [duloxetine  hcl], and Oxycodone    History: Past Medical History:  Diagnosis Date   Anemia    Arthritis    Cataract    REMOVED BILATERAL   Colon polyps    COPD (chronic obstructive pulmonary disease) (HCC)    DIABETES MELLITUS, TYPE II    takes Metformin  and Glimepiride  daily   Diverticulosis    GERD (gastroesophageal reflux disease)    takes Protonix  daily   History of colon polyps    HYPERLIPIDEMIA    takes Atorvastatin  daily   HYPERTENSION    takes Amlodipine  and Hyzaar daily   Ileus following gastrointestinal surgery (HCC) 07/22/2012   Joint pain    Peripheral neuropathy    Peripheral neuropathy    PERIPHERAL NEUROPATHY, FEET 10/06/2007   Renal disorder    VITAMIN B12 DEFICIENCY 09/06/2008   VITAMIN D  DEFICIENCY 01/16/2010   takes Vitamin D  daily   Past Surgical History:  Procedure Laterality Date   CATARACT EXTRACTION     both eyes   CHOLECYSTECTOMY N/A 07/18/2012   Procedure: LAPAROSCOPIC CHOLECYSTECTOMY WITH INTRAOPERATIVE CHOLANGIOGRAM;  Surgeon: Dann FORBES Hummer, MD;  Location: [facility] OR;  Service: General;  Laterality: N/A;   COLONOSCOPY     EYE SURGERY Right 01/2023   POLYPECTOMY     POSTERIOR CERVICAL FUSION/FORAMINOTOMY N/A 06/27/2015   Procedure: Posterior Cervical Fusion with lateral mass fixation Cervical four to cervical seven;  Surgeon: Alm GORMAN Molt, MD;  Location: [facility];  Service: Neurosurgery;  Laterality: N/A;   TOTAL KNEE ARTHROPLASTY  2001   Left   TURP VAPORIZATION     Family History  Problem Relation  Age of Onset   Diabetes Mellitus II Mother    Lung cancer Brother    Colon cancer Neg Hx    Esophageal cancer Neg Hx    Rectal cancer Neg Hx    Stomach cancer Neg Hx    Social History   Socioeconomic History   Marital status: Widowed    Spouse name: Not on file   Number of children: 5   Years of education: Not on file   Highest education level: 12th grade  Occupational History   Not on file  Tobacco Use   Smoking status: Former    Current packs/day: 0.00    Average packs/day: 1 pack/day for 31.0 years (31.0 ttl pk-yrs)    Types: Cigarettes    Start date: 12/22/1952    Quit date: 12/23/1983    Years since quitting: 5.9  Passive exposure: Never   Smokeless tobacco: Never  Vaping Use   Vaping status: Never Used  Substance and Sexual Activity   Alcohol use: No    Alcohol/week: 0.0 standard drinks of alcohol   Drug use: No   Sexual activity: Never  Other Topics Concern   Not on file  Social History Narrative   Lives alone/2025   Social Drivers of Health   Financial Resource Strain: Low Risk  (11/17/2023)   Overall Financial Resource Strain (CARDIA)    Difficulty of Paying Living Expenses: Not hard at all  Food Insecurity: No Food Insecurity (11/17/2023)   Hunger Vital Sign    Worried About Running Out of Food in the Last Year: Never true    Ran Out of Food in the Last Year: Never true  Transportation Needs: No Transportation Needs (11/17/2023)   PRAPARE - Administrator, Civil Service (Medical): No    Lack of Transportation (Non-Medical): No  Physical Activity: Insufficiently Active (11/17/2023)   Exercise Vital Sign    Days of Exercise per Week: 1 day    Minutes of Exercise per Session: 10 min  Stress: No Stress Concern Present (11/17/2023)   [employer] of Occupational Health - Occupational Stress Questionnaire    Feeling of Stress: Not at all  Social Connections: Moderately Integrated (11/17/2023)   Social Connection and Isolation Panel     Frequency of Communication with Friends and Family: More than three times a week    Frequency of Social Gatherings with Friends and Family: More than three times a week    Attends Religious Services: More than 4 times per year    Active Member of [employer] or Organizations: Yes    Attends Banker Meetings: More than 4 times per year    Marital Status: Widowed    Tobacco Counseling Counseling given: Not Answered    Clinical Intake:  Pre-visit preparation completed: Yes  Pain : No/denies pain     BMI - recorded: 24.52 Nutritional Status: BMI of 19-24  Normal Nutritional Risks: None Diabetes: Yes CBG done?: Yes (87-per pt) CBG resulted in Enter/ Edit results?: No Did pt. bring in CBG monitor from home?: No  Lab Results  Component Value Date   HGBA1C 7.4 (H) 04/23/2023   HGBA1C 8.2 (H) 10/21/2022   HGBA1C 7.5 (H) 04/22/2022     How often do you need to have someone help you when you read instructions, pamphlets, or other written materials from your doctor or pharmacy?: 1 - Never  Interpreter Needed?: No  Information entered by :: Velena Keegan, RMA   Activities of Daily Living     11/17/2023    8:50 AM 05/30/2023    5:19 PM  In your present state of health, do you have any difficulty performing the following activities:  Hearing? 1 0  Vision? 0 0  Difficulty concentrating or making decisions? 0 0  Walking or climbing stairs? 1   Dressing or bathing? 0   Doing errands, shopping? 0   Preparing Food and eating ? N   Using the Toilet? N   In the past six months, have you accidently leaked urine? N   Do you have problems with loss of bowel control? N   Managing your Medications? N   Managing your Finances? N   Housekeeping or managing your Housekeeping? N     Patient Care Team: Norleen Lynwood ORN, MD as PCP - General Branch, Ronal BRAVO, MD (Inactive) as PCP - Cardiology (  Cardiology) Octavia Charleston, MD as Referring Physician (Ophthalmology) Dolan Mateo Larger, MD as Consulting Physician (Nephrology)  I have updated your Care Teams any recent Medical Services you may have received from other providers in the past year.     Assessment:   This is a routine wellness examination for Nicholas Patton.  Hearing/Vision screen Hearing Screening - Comments:: Wears a hearing aide in lt ear Vision Screening - Comments:: Wears eyeglasses/ Dr. Octavia   Goals Addressed   None    Depression Screen     11/18/2023   10:53 AM 07/02/2023   10:00 AM 06/16/2023    1:46 PM 04/23/2023   10:06 AM 12/07/2022    9:05 AM 11/16/2022   11:13 AM 10/21/2022   10:07 AM  PHQ 2/9 Scores  PHQ - 2 Score 0 0 0 0 0 0 0  PHQ- 9 Score 2          Fall Risk     11/17/2023    8:50 AM 07/19/2023    8:41 AM 06/24/2023   10:00 AM 06/18/2023   10:00 AM 06/16/2023    1:51 PM  Fall Risk   Falls in the past year? 0 1 1 1 1   Number falls in past yr: 0 1 1 1 1   Injury with Fall? 0 0 0 0 0  Risk for fall due to :   Impaired mobility;History of fall(s);Medication side effect History of fall(s);Medication side effect;Other (Comment) History of fall(s)  Risk for fall due to: Comment    confirmed no recent falls   Follow up   Falls prevention discussed;Education provided Falls prevention discussed Falls evaluation completed    MEDICARE RISK AT HOME:  Medicare Risk at Home Any stairs in or around the home?: (Patient-Rptd) No If so, are there any without handrails?: (Patient-Rptd) No Home free of loose throw rugs in walkways, pet beds, electrical cords, etc?: (Patient-Rptd) Yes Adequate lighting in your home to reduce risk of falls?: (Patient-Rptd) Yes Life alert?: (Patient-Rptd) No Use of a cane, walker or w/c?: (Patient-Rptd) No Grab bars in the bathroom?: (Patient-Rptd) Yes Shower chair or bench in shower?: (Patient-Rptd) Yes Elevated toilet seat or a handicapped toilet?: (Patient-Rptd) Yes  TIMED UP AND GO:  Was the test performed?  No  Cognitive Function: Declined/Normal: No  cognitive concerns noted by patient or family. Patient alert, oriented, able to answer questions appropriately and recall recent events. No signs of memory loss or confusion.    10/22/2021   11:05 AM  MMSE - Mini Mental State Exam  Orientation to time 5  Orientation to Place 5  Registration 3  Attention/ Calculation 5  Recall 3  Language- name 2 objects 2  Language- repeat 1  Language- follow 3 step command 3  Language- read & follow direction 1  Write a sentence 1  Copy design 1  Total score 30        11/16/2022   11:15 AM  6CIT Screen  What Year? 0 points  What month? 0 points  What time? 0 points  Count back from 20 0 points  Months in reverse 0 points  Repeat phrase 0 points  Total Score 0 points    Immunizations Immunization History  Administered Date(s) Administered   Fluad Quad(high Dose 65+) 11/09/2018, 11/30/2019, 12/14/2020, 04/22/2022   Fluad Trivalent(High Dose 65+) 04/23/2023   INFLUENZA, HIGH DOSE SEASONAL PF 01/24/2013, 12/01/2016, 11/22/2017   Influenza Whole 11/21/2015   Influenza,inj,Quad PF,6+ Mos 12/08/2013   Influenza-Unspecified 02/08/2015   PFIZER(Purple  Top)SARS-COV-2 Vaccination 05/03/2019, 05/04/2019, 12/23/2019, 10/10/2020   Pneumococcal Conjugate-13 03/22/2013   Pneumococcal Polysaccharide-23 01/10/2016   Td 10/05/2014   Zoster Recombinant(Shingrix) 07/25/2021, 01/19/2022    Screening Tests Health Maintenance  Topic Date Due   Influenza Vaccine  10/08/2023   FOOT EXAM  04/22/2024   OPHTHALMOLOGY EXAM  08/03/2024   DTaP/Tdap/Td (2 - Tdap) 10/04/2024   Medicare Annual Wellness (AWV)  11/17/2024   Pneumococcal Vaccine: 50+ Years  Completed   Zoster Vaccines- Shingrix  Completed   HPV VACCINES  Aged Out   Meningococcal B Vaccine  Aged Out   COVID-19 Vaccine  Discontinued    Health Maintenance Items Addressed: See Nurse Notes at the end of this note  Additional Screening:  Vision Screening: Recommended annual ophthalmology exams  for early detection of glaucoma and other disorders of the eye. Is the patient up to date with their annual eye exam?  No  Who is the provider or what is the name of the office in which the patient attends annual eye exams? Dr. Octavia  Dental Screening: Recommended annual dental exams for proper oral hygiene  Community Resource Referral / Chronic Care Management: CRR required this visit?  No   CCM required this visit?  No   Plan:    I have personally reviewed and noted the following in the patient's chart:   Medical and social history Use of alcohol, tobacco or illicit drugs  Current medications and supplements including opioid prescriptions. Patient is not currently taking opioid prescriptions. Functional ability and status Nutritional status Physical activity Advanced directives List of other physicians Hospitalizations, surgeries, and ER visits in previous 12 months Vitals Screenings to include cognitive, depression, and falls Referrals and appointments  In addition, I have reviewed and discussed with patient certain preventive protocols, quality metrics, and best practice recommendations. A written personalized care plan for preventive services as well as general preventive health recommendations were provided to patient.   Bailie Christenbury L Fredna Stricker, CMA   11/18/2023   After Visit Summary: (MyChart) Due to this being a telephonic visit, the after visit summary with patients personalized plan was offered to patient via MyChart   Notes: Nothing significant to report at this time.

## 2023-11-18 NOTE — Patient Instructions (Signed)
 [name],  Thank you for taking the time for your Medicare Wellness Visit. I appreciate your continued commitment to your health goals. Please review the care plan we discussed, and feel free to reach out if I can assist you further.  Medicare recommends these wellness visits once per year to help you and your care team stay ahead of potential health issues. These visits are designed to focus on prevention, allowing your provider to concentrate on managing your acute and chronic conditions during your regular appointments.  Please note that Annual Wellness Visits do not include a physical exam. Some assessments may be limited, especially if the visit was conducted virtually. If needed, we may recommend a separate in-person follow-up with your provider.  Ongoing Care Seeing your primary care provider every 3 to 6 months helps us  monitor your health and provide consistent, personalized care. Next office visit on 12/20/2023. Keep up the good work.  Referrals If a referral was made during today's visit and you haven't received any updates within two weeks, please contact the referred provider directly to check on the status.  Recommended Screenings:  Health Maintenance  Topic Date Due   Flu Shot  10/08/2023   Medicare Annual Wellness Visit  11/16/2023   Complete foot exam   04/22/2024   Eye exam for diabetics  08/03/2024   DTaP/Tdap/Td vaccine (2 - Tdap) 10/04/2024   Pneumococcal Vaccine for age over 17  Completed   Zoster (Shingles) Vaccine  Completed   HPV Vaccine  Aged Out   Meningitis B Vaccine  Aged Out   COVID-19 Vaccine  Discontinued       11/18/2023   10:52 AM  Advanced Directives  Does Patient Have a Medical Advance Directive? No   Advance Care Planning is important because it: Ensures you receive medical care that aligns with your values, goals, and preferences. Provides guidance to your family and loved ones, reducing the emotional burden of decision-making during  critical moments.  Vision: Annual vision screenings are recommended for early detection of glaucoma, cataracts, and diabetic retinopathy. These exams can also reveal signs of chronic conditions such as diabetes and high blood pressure.  Dental: Annual dental screenings help detect early signs of oral cancer, gum disease, and other conditions linked to overall health, including heart disease and diabetes.  Please see the attached documents for additional preventive care recommendations.

## 2023-11-23 ENCOUNTER — Other Ambulatory Visit: Payer: Self-pay | Admitting: Internal Medicine

## 2023-11-23 DIAGNOSIS — H40013 Open angle with borderline findings, low risk, bilateral: Secondary | ICD-10-CM | POA: Diagnosis not present

## 2023-11-23 DIAGNOSIS — E119 Type 2 diabetes mellitus without complications: Secondary | ICD-10-CM | POA: Diagnosis not present

## 2023-11-23 DIAGNOSIS — Z961 Presence of intraocular lens: Secondary | ICD-10-CM | POA: Diagnosis not present

## 2023-11-23 DIAGNOSIS — H4322 Crystalline deposits in vitreous body, left eye: Secondary | ICD-10-CM | POA: Diagnosis not present

## 2023-11-23 DIAGNOSIS — H02821 Cysts of right upper eyelid: Secondary | ICD-10-CM | POA: Diagnosis not present

## 2023-11-23 DIAGNOSIS — H11823 Conjunctivochalasis, bilateral: Secondary | ICD-10-CM | POA: Diagnosis not present

## 2023-11-23 DIAGNOSIS — H43822 Vitreomacular adhesion, left eye: Secondary | ICD-10-CM | POA: Diagnosis not present

## 2023-11-23 DIAGNOSIS — Z760 Encounter for issue of repeat prescription: Secondary | ICD-10-CM

## 2023-11-23 LAB — HM DIABETES EYE EXAM

## 2023-12-07 DIAGNOSIS — N184 Chronic kidney disease, stage 4 (severe): Secondary | ICD-10-CM | POA: Diagnosis not present

## 2023-12-15 ENCOUNTER — Other Ambulatory Visit: Payer: Self-pay

## 2023-12-15 DIAGNOSIS — D492 Neoplasm of unspecified behavior of bone, soft tissue, and skin: Secondary | ICD-10-CM | POA: Diagnosis not present

## 2023-12-15 DIAGNOSIS — L720 Epidermal cyst: Secondary | ICD-10-CM | POA: Diagnosis not present

## 2023-12-15 MED ORDER — FUROSEMIDE 40 MG PO TABS: ORAL_TABLET | ORAL | 1 refills | Status: AC

## 2023-12-16 ENCOUNTER — Ambulatory Visit: Admitting: Internal Medicine

## 2023-12-17 DIAGNOSIS — N2581 Secondary hyperparathyroidism of renal origin: Secondary | ICD-10-CM | POA: Diagnosis not present

## 2023-12-17 DIAGNOSIS — D631 Anemia in chronic kidney disease: Secondary | ICD-10-CM | POA: Diagnosis not present

## 2023-12-17 DIAGNOSIS — N184 Chronic kidney disease, stage 4 (severe): Secondary | ICD-10-CM | POA: Diagnosis not present

## 2023-12-17 DIAGNOSIS — M109 Gout, unspecified: Secondary | ICD-10-CM | POA: Diagnosis not present

## 2023-12-17 DIAGNOSIS — I129 Hypertensive chronic kidney disease with stage 1 through stage 4 chronic kidney disease, or unspecified chronic kidney disease: Secondary | ICD-10-CM | POA: Diagnosis not present

## 2023-12-17 DIAGNOSIS — E559 Vitamin D deficiency, unspecified: Secondary | ICD-10-CM | POA: Diagnosis not present

## 2023-12-17 DIAGNOSIS — R600 Localized edema: Secondary | ICD-10-CM | POA: Diagnosis not present

## 2023-12-20 ENCOUNTER — Encounter: Payer: Self-pay | Admitting: Internal Medicine

## 2023-12-20 ENCOUNTER — Ambulatory Visit: Admitting: Internal Medicine

## 2023-12-20 VITALS — BP 136/72 | HR 90 | Temp 97.7°F | Ht 74.0 in | Wt 193.0 lb

## 2023-12-20 DIAGNOSIS — Z794 Long term (current) use of insulin: Secondary | ICD-10-CM

## 2023-12-20 DIAGNOSIS — E7800 Pure hypercholesterolemia, unspecified: Secondary | ICD-10-CM

## 2023-12-20 DIAGNOSIS — J9611 Chronic respiratory failure with hypoxia: Secondary | ICD-10-CM

## 2023-12-20 DIAGNOSIS — E559 Vitamin D deficiency, unspecified: Secondary | ICD-10-CM | POA: Diagnosis not present

## 2023-12-20 DIAGNOSIS — D472 Monoclonal gammopathy: Secondary | ICD-10-CM | POA: Diagnosis not present

## 2023-12-20 DIAGNOSIS — E1140 Type 2 diabetes mellitus with diabetic neuropathy, unspecified: Secondary | ICD-10-CM | POA: Diagnosis not present

## 2023-12-20 DIAGNOSIS — J439 Emphysema, unspecified: Secondary | ICD-10-CM | POA: Diagnosis not present

## 2023-12-20 DIAGNOSIS — E039 Hypothyroidism, unspecified: Secondary | ICD-10-CM | POA: Diagnosis not present

## 2023-12-20 DIAGNOSIS — I10 Essential (primary) hypertension: Secondary | ICD-10-CM | POA: Diagnosis not present

## 2023-12-20 DIAGNOSIS — E1142 Type 2 diabetes mellitus with diabetic polyneuropathy: Secondary | ICD-10-CM

## 2023-12-20 LAB — POCT GLYCOSYLATED HEMOGLOBIN (HGB A1C): Hemoglobin A1C: 6.5 % — AB (ref 4.0–5.6)

## 2023-12-20 NOTE — Progress Notes (Unsigned)
 Patient ID: Nicholas Patton, male   DOB: 12/25/1934, 89 y.o.   MRN: [id number]        Chief Complaint: follow up HTN, HLD and hyperglycemia ***       HPI:  Nicholas Patton is a 89 y.o. male here with c/o        Wt Readings from Last 3 Encounters:  12/20/23 193 lb (87.5 kg)  11/18/23 191 lb (86.6 kg)  10/26/23 191 lb (86.6 kg)   BP Readings from Last 3 Encounters:  12/20/23 136/72  10/26/23 139/74  07/19/23 (!) 171/68         Past Medical History:  Diagnosis Date   Anemia    Arthritis    Cataract    REMOVED BILATERAL   Colon polyps    COPD (chronic obstructive pulmonary disease) (HCC)    DIABETES MELLITUS, TYPE II    takes Metformin  and Glimepiride  daily   Diverticulosis    GERD (gastroesophageal reflux disease)    takes Protonix  daily   History of colon polyps    HYPERLIPIDEMIA    takes Atorvastatin  daily   HYPERTENSION    takes Amlodipine  and Hyzaar daily   Ileus following gastrointestinal surgery (HCC) 07/22/2012   Joint pain    Peripheral neuropathy    Peripheral neuropathy    PERIPHERAL NEUROPATHY, FEET 10/06/2007   Renal disorder    VITAMIN B12 DEFICIENCY 09/06/2008   VITAMIN D  DEFICIENCY 01/16/2010   takes Vitamin D  daily   Past Surgical History:  Procedure Laterality Date   CATARACT EXTRACTION     both eyes   CHOLECYSTECTOMY N/A 07/18/2012   Procedure: LAPAROSCOPIC CHOLECYSTECTOMY WITH INTRAOPERATIVE CHOLANGIOGRAM;  Surgeon: Dann FORBES Hummer, MD;  Location: [facility] OR;  Service: General;  Laterality: N/A;   COLONOSCOPY     EYE SURGERY Right 01/2023   POLYPECTOMY     POSTERIOR CERVICAL FUSION/FORAMINOTOMY N/A 06/27/2015   Procedure: Posterior Cervical Fusion with lateral mass fixation Cervical four to cervical seven;  Surgeon: Alm GORMAN Molt, MD;  Location: [facility];  Service: Neurosurgery;  Laterality: N/A;   TOTAL KNEE ARTHROPLASTY  2001   Left   TURP VAPORIZATION      reports that he quit smoking about 40 years ago. His smoking use included cigarettes.  He started smoking about 71 years ago. He has a 31 pack-year smoking history. He has never been exposed to tobacco smoke. He has never used smokeless tobacco. He reports that he does not drink alcohol and does not use drugs. family history includes Diabetes Mellitus II in his mother; Lung cancer in his brother. Allergies  Allergen Reactions   Amlodipine  Swelling   Cymbalta  [Duloxetine  Hcl] Other (See Comments)   Oxycodone  Other (See Comments)   Current Outpatient Medications on File Prior to Visit  Medication Sig Dispense Refill   albuterol  (VENTOLIN  HFA) 108 (90 Base) MCG/ACT inhaler INHALE 2 PUFFS BY MOUTH EVERY 6 HOURS AS NEEDED FOR WHEEZING FOR SHORTNESS OF BREATH 18 g 2   allopurinol  (ZYLOPRIM ) 100 MG tablet Take 1 tablet (100 mg total) by mouth daily. 90 tablet 1   atorvastatin  (LIPITOR) 40 MG tablet TAKE 1 TABLET BY MOUTH DAILY 90 tablet 3   BREO ELLIPTA  200-25 MCG/ACT AEPB USE 1 INHALATION BY MOUTH ONCE  DAILY AT THE SAME TIME EACH DAY 180 each 3   carvedilol  (COREG ) 12.5 MG tablet TAKE 1 TABLET BY MOUTH TWICE  DAILY WITH MEALS 180 tablet 3   cholecalciferol  (VITAMIN D3) 25 MCG (1000 UNIT) tablet  Take 1,000 Units by mouth daily. 06/02/23: Reports during TOC call taking OTC 2,000 Units every day     doxazosin  (CARDURA ) 2 MG tablet Take 1 tablet (2 mg total) by mouth daily. 90 tablet 3   finasteride  (PROSCAR ) 5 MG tablet TAKE 1 TABLET BY MOUTH DAILY 90 tablet 3   furosemide  (LASIX ) 40 MG tablet Take 1 tab by mouth in the AM every day, and 1 tab by mouth in the afternoon for persistent swelling as needed 180 tablet 1   glimepiride  (AMARYL ) 2 MG tablet TAKE 2 TABLETS BY MOUTH DAILY  BEFORE BREAKFAST 180 tablet 3   levothyroxine  (SYNTHROID ) 50 MCG tablet TAKE 1 TABLET BY MOUTH DAILY 90 tablet 3   losartan  (COZAAR ) 100 MG tablet TAKE 1 TABLET BY MOUTH DAILY 90 tablet 3   pantoprazole  (PROTONIX ) 40 MG tablet TAKE 1 TABLET BY MOUTH DAILY 90 tablet 3   pioglitazone  (ACTOS ) 30 MG tablet TAKE 1  TABLET BY MOUTH DAILY 90 tablet 3   vitamin B-12 (CYANOCOBALAMIN ) 100 MCG tablet Take 100 mcg by mouth daily.     betamethasone  dipropionate 0.05 % cream Apply topically 2 (two) times daily. (Patient not taking: Reported on 12/20/2023) 30 g 1   ferrous sulfate 325 (65 FE) MG EC tablet Take 325 mg by mouth 3 (three) times daily with meals. 06/18/23: Daughter reports nephrology provider prescribed iron 325 mg OTC at time of office visit on 06/16/23 (Patient not taking: Reported on 12/20/2023)     No current facility-administered medications on file prior to visit.        ROS:  All others reviewed and negative.  Objective        PE:  BP 136/72 (BP Location: Right Arm, Patient Position: Sitting, Cuff Size: Normal)   Pulse 90   Temp 97.7 F (36.5 C) (Oral)   Ht 6' 2 (1.88 m)   Wt 193 lb (87.5 kg)   SpO2 95%   BMI 24.78 kg/m                 Constitutional: Pt appears in NAD               HENT: Head: NCAT.                Right Ear: External ear normal.                 Left Ear: External ear normal.                Eyes: . Pupils are equal, round, and reactive to light. Conjunctivae and EOM are normal               Nose: without d/c or deformity               Neck: Neck supple. Gross normal ROM               Cardiovascular: Normal rate and regular rhythm.                 Pulmonary/Chest: Effort normal and breath sounds without rales or wheezing.                Abd:  Soft, NT, ND, + BS, no organomegaly               Neurological: Pt is alert. At baseline orientation, motor grossly intact               Skin: Skin is warm. No rashes,  no other new lesions, LE edema - ***               Psychiatric: Pt behavior is normal without agitation   Micro: none  Cardiac tracings I have personally interpreted today:  none  Pertinent Radiological findings (summarize): none   Lab Results  Component Value Date   WBC 5.2 06/01/2023   HGB 9.2 (L) 06/01/2023   HCT 28.5 (L) 06/01/2023   PLT 202  06/01/2023   GLUCOSE 193 (H) 06/01/2023   CHOL 131 04/23/2023   TRIG 65.0 04/23/2023   HDL 49.70 04/23/2023   LDLCALC 69 04/23/2023   ALT 22 05/31/2023   AST 21 05/31/2023   NA 137 06/01/2023   K 3.9 06/01/2023   CL 108 06/01/2023   CREATININE 2.79 (H) 06/01/2023   BUN 65 (H) 06/01/2023   CO2 21 (L) 06/01/2023   TSH 5.44 04/23/2023   PSA 4.61 (H) 04/09/2017   INR 1.12 06/19/2015   HGBA1C 7.4 (H) 04/23/2023   MICROALBUR 1.7 04/23/2023   Assessment/Plan:  Nicholas Patton is a 89 y.o. Black or African American [2] male with  has a past medical history of Anemia, Arthritis, Cataract, Colon polyps, COPD (chronic obstructive pulmonary disease) (HCC), DIABETES MELLITUS, TYPE II, Diverticulosis, GERD (gastroesophageal reflux disease), History of colon polyps, HYPERLIPIDEMIA, HYPERTENSION, Ileus following gastrointestinal surgery (HCC) (07/22/2012), Joint pain, Peripheral neuropathy, Peripheral neuropathy, PERIPHERAL NEUROPATHY, FEET (10/06/2007), Renal disorder, VITAMIN B12 DEFICIENCY (09/06/2008), and VITAMIN D  DEFICIENCY (01/16/2010).  No problem-specific Assessment & Plan notes found for this encounter.  Followup: No follow-ups on file.  Lynwood Rush, MD 12/20/2023 9:56 AM [facility]  Primary Care - [facility]

## 2023-12-20 NOTE — Assessment & Plan Note (Addendum)
 With neuropathy and hyperglycemia  Lab Results  Component Value Date   HGBA1C 6.5 (A) 12/20/2023   Much improved, ok to stop the actos  30 mg given acceptable A1c can be up to 7.5 for age

## 2023-12-20 NOTE — Patient Instructions (Addendum)
 Your A1c was done today - ok to STOP the actos   Please call oncology for your yearly lab testing for the MGUS (monoclonal gammopathy)  Please call for appt with Dr [facility] for Feb 2026  Please continue all other medications as before, including the Home o2 2L [place] continuous.  Please have the pharmacy call with any other refills you may need.  Please continue your efforts at being more active, low cholesterol diet, and weight control.  You are otherwise up to date with prevention measures today.  Please keep your appointments with your specialists as you may have planned - renal in 6 months  Please make an Appointment to return in 6 months, or sooner if needed

## 2023-12-23 ENCOUNTER — Encounter: Payer: Self-pay | Admitting: Internal Medicine

## 2023-12-23 NOTE — Assessment & Plan Note (Signed)
 BP Readings from Last 3 Encounters:  12/20/23 136/72  10/26/23 139/74  07/19/23 (!) 171/68   Stable, pt to continue medical treatment coreg  12.5 bid

## 2023-12-23 NOTE — Assessment & Plan Note (Signed)
 Lab Results  Component Value Date   LDLCALC 69 04/23/2023   Stable, pt to continue current statin lipitor 40 mg qd

## 2023-12-23 NOTE — Assessment & Plan Note (Signed)
 Last vitamin D Lab Results  Component Value Date   VD25OH 27.75 (L) 04/23/2023   Low, to start oral replacement

## 2023-12-23 NOTE — Assessment & Plan Note (Signed)
 Lab Results  Component Value Date   TSH 5.44 04/23/2023   Stable, pt to continue levothyroxine 50 mcg qd

## 2023-12-23 NOTE — Assessment & Plan Note (Signed)
 Due for f/u lab, pt encouraged to call heme for f/u

## 2023-12-23 NOTE — Assessment & Plan Note (Signed)
 Stable, cont breo and albuterol  prn, pt encouraged to call for appt feb 2026 as recommended at last visit

## 2023-12-23 NOTE — Assessment & Plan Note (Signed)
Stable, cont home o2 2L

## 2023-12-31 ENCOUNTER — Emergency Department (HOSPITAL_COMMUNITY)
Admission: EM | Admit: 2023-12-31 | Discharge: 2023-12-31 | Disposition: A | Discharge status: Home / Self Care | Attending: Emergency Medicine | Admitting: Emergency Medicine

## 2023-12-31 ENCOUNTER — Other Ambulatory Visit: Payer: Self-pay

## 2023-12-31 DIAGNOSIS — E1122 Type 2 diabetes mellitus with diabetic chronic kidney disease: Secondary | ICD-10-CM | POA: Diagnosis not present

## 2023-12-31 DIAGNOSIS — J449 Chronic obstructive pulmonary disease, unspecified: Secondary | ICD-10-CM | POA: Diagnosis not present

## 2023-12-31 DIAGNOSIS — R040 Epistaxis: Secondary | ICD-10-CM | POA: Insufficient documentation

## 2023-12-31 DIAGNOSIS — G4489 Other headache syndrome: Secondary | ICD-10-CM | POA: Diagnosis not present

## 2023-12-31 DIAGNOSIS — N184 Chronic kidney disease, stage 4 (severe): Secondary | ICD-10-CM | POA: Insufficient documentation

## 2023-12-31 DIAGNOSIS — Z79899 Other long term (current) drug therapy: Secondary | ICD-10-CM | POA: Insufficient documentation

## 2023-12-31 DIAGNOSIS — I129 Hypertensive chronic kidney disease with stage 1 through stage 4 chronic kidney disease, or unspecified chronic kidney disease: Secondary | ICD-10-CM | POA: Diagnosis not present

## 2023-12-31 DIAGNOSIS — Z7984 Long term (current) use of oral hypoglycemic drugs: Secondary | ICD-10-CM | POA: Diagnosis not present

## 2023-12-31 LAB — CBC
HCT: 40.6 % (ref 39.0–52.0)
Hemoglobin: 12.4 g/dL — ABNORMAL LOW (ref 13.0–17.0)
MCH: 30 pg (ref 26.0–34.0)
MCHC: 30.5 g/dL (ref 30.0–36.0)
MCV: 98.3 fL (ref 80.0–100.0)
Platelets: 142 K/uL — ABNORMAL LOW (ref 150–400)
RBC: 4.13 MIL/uL — ABNORMAL LOW (ref 4.22–5.81)
RDW: 15.2 % (ref 11.5–15.5)
WBC: 4 K/uL (ref 4.0–10.5)
nRBC: 0 % (ref 0.0–0.2)

## 2023-12-31 LAB — BASIC METABOLIC PANEL WITH GFR
Anion gap: 12 (ref 5–15)
BUN: 48 mg/dL — ABNORMAL HIGH (ref 8–23)
CO2: 23 mmol/L (ref 22–32)
Calcium: 10.2 mg/dL (ref 8.9–10.3)
Chloride: 108 mmol/L (ref 98–111)
Creatinine, Ser: 2.22 mg/dL — ABNORMAL HIGH (ref 0.61–1.24)
GFR, Estimated: 28 mL/min — ABNORMAL LOW (ref >60)
Glucose, Bld: 156 mg/dL — ABNORMAL HIGH (ref 70–99)
Potassium: 3.8 mmol/L (ref 3.5–5.1)
Sodium: 143 mmol/L (ref 135–145)

## 2023-12-31 LAB — PROTIME-INR
INR: 1.2 (ref 0.8–1.2)
Prothrombin Time: 15.7 s — ABNORMAL HIGH (ref 11.4–15.2)

## 2023-12-31 MED ORDER — OXYMETAZOLINE HCL 0.05 % NA SOLN
1.0000 | Freq: Once | NASAL | Status: AC
  Administered 2023-12-31: 1 via NASAL
  Filled 2023-12-31: qty 30

## 2023-12-31 MED ORDER — CEPHALEXIN 500 MG PO CAPS: 500.0000 mg | ORAL_CAPSULE | Freq: Four times a day (QID) | ORAL | 0 refills | Status: AC

## 2023-12-31 MED ORDER — TRANEXAMIC ACID FOR EPISTAXIS
500.0000 mg | Freq: Once | TOPICAL | Status: AC
  Administered 2023-12-31: 500 mg via TOPICAL
  Filled 2023-12-31 (×2): qty 10

## 2023-12-31 NOTE — ED Provider Notes (Signed)
 Epistaxis Management  Date/Time: 12/31/2023 4:52 PM  Performed by: Rogelia Jerilynn RAMAN, MD Authorized by: Rogelia Jerilynn RAMAN, MD   Consent:    Consent obtained:  Verbal   Consent given by:  Patient (Granddaughter at bedside)   Risks, benefits, and alternatives were discussed: yes     Risks discussed:  Bleeding, nasal injury and pain   Alternatives discussed:  No treatment, alternative treatment and referral Universal protocol:    Immediately prior to procedure, a time out was called: yes     Patient identity confirmed:  Verbally with patient Anesthesia:    Anesthesia method:  None Procedure details:    Treatment site:  R anterior   Treatment method:  Anterior pack   Treatment complexity:  Limited   Treatment episode: recurring   Post-procedure details:    Assessment:  Bleeding decreased   Procedure completion:  Tolerated well, no immediate complications Comments:     Patient initially presented for epistaxis, has required previous cauterizations.  Initially patient was evaluated by Belaya, PA-C, who attempted placement of TXA as well as placement of medium Rhino Rocket, however patient had persistent severe bleeding, and she was unable to pass large [employer].  On my evaluation patient with persistent bleeding.  Afrin applied liberally to the bilateral naris and thereafter large Rhino Rocket was placed into the left nare.  Bleeding decreased significantly.     Rogelia Jerilynn RAMAN, MD 12/31/23 [phone]

## 2023-12-31 NOTE — Consult Note (Signed)
 ENT CONSULT:  Reason for Consult: Epistaxis  Referring Physician: ED team  HPI: Nicholas Patton is an 89 y.o. male who ENT was consulted for Epistaxis  Epistaxis history: started around 2 AM, randomly. No trauma, no nose blowing. Nose did feel a bit dry. From right side. Has h/o right sided epistaxis, requiring cautery by Dr. Karis x2 this past winter. Tried afrin, did not help. Came to ED -- packed x2, after last pack hemostatic for past few hours. ENT consulted given his epistaxis hx. Does have COPD requiring PRN O2 per him and daughter but did not use it recently HTN: y CKD/Liver dysfunction: y Anticoagulation/AP: no Trauma: no History of Sinusitis: no Nasal obstruction: no Nasal procedures: see above Current nasal medication use: uses saline spray PRN   Past Medical History:  Diagnosis Date   Anemia    Arthritis    Cataract    REMOVED BILATERAL   Colon polyps    COPD (chronic obstructive pulmonary disease) (HCC)    DIABETES MELLITUS, TYPE II    takes Metformin  and Glimepiride  daily   Diverticulosis    GERD (gastroesophageal reflux disease)    takes Protonix  daily   History of colon polyps    HYPERLIPIDEMIA    takes Atorvastatin  daily   HYPERTENSION    takes Amlodipine  and Hyzaar daily   Ileus following gastrointestinal surgery (HCC) 07/22/2012   Joint pain    Peripheral neuropathy    Peripheral neuropathy    PERIPHERAL NEUROPATHY, FEET 10/06/2007   Renal disorder    VITAMIN B12 DEFICIENCY 09/06/2008   VITAMIN D  DEFICIENCY 01/16/2010   takes Vitamin D  daily    Past Surgical History:  Procedure Laterality Date   CATARACT EXTRACTION     both eyes   CHOLECYSTECTOMY N/A 07/18/2012   Procedure: LAPAROSCOPIC CHOLECYSTECTOMY WITH INTRAOPERATIVE CHOLANGIOGRAM;  Surgeon: Dann FORBES Hummer, MD;  Location: [facility] OR;  Service: General;  Laterality: N/A;   COLONOSCOPY     EYE SURGERY Right 01/2023   POLYPECTOMY     POSTERIOR CERVICAL FUSION/FORAMINOTOMY N/A 06/27/2015    Procedure: Posterior Cervical Fusion with lateral mass fixation Cervical four to cervical seven;  Surgeon: Alm GORMAN Molt, MD;  Location: [facility];  Service: Neurosurgery;  Laterality: N/A;   TOTAL KNEE ARTHROPLASTY  2001   Left   TURP VAPORIZATION      Family History  Problem Relation Age of Onset   Diabetes Mellitus II Mother    Lung cancer Brother    Colon cancer Neg Hx    Esophageal cancer Neg Hx    Rectal cancer Neg Hx    Stomach cancer Neg Hx     Social History:  reports that he quit smoking about 40 years ago. His smoking use included cigarettes. He started smoking about 71 years ago. He has a 31 pack-year smoking history. He has never been exposed to tobacco smoke. He has never used smokeless tobacco. He reports that he does not drink alcohol and does not use drugs.  Allergies:  Allergies  Allergen Reactions   Amlodipine  Swelling   Cymbalta  [Duloxetine  Hcl] Other (See Comments)   Oxycodone  Other (See Comments)    Medications: I have reviewed the patient's current medications.  Results for orders placed or performed during the hospital encounter of 12/31/23 (from the past 48 hours)  CBC     Status: Abnormal   Collection Time: 12/31/23 12:32 PM  Result Value Ref Range   WBC 4.0 4.0 - 10.5 K/uL   RBC 4.13 (  L) 4.22 - 5.81 MIL/uL   Hemoglobin 12.4 (L) 13.0 - 17.0 g/dL   HCT 59.3 60.9 - 47.9 %   MCV 98.3 80.0 - 100.0 fL   MCH 30.0 26.0 - 34.0 pg   MCHC 30.5 30.0 - 36.0 g/dL   RDW 84.7 88.4 - 84.4 %   Platelets 142 (L) 150 - 400 K/uL   nRBC 0.0 0.0 - 0.2 %    Comment: Performed at [facility], [address] [place], [place] [address]  Basic metabolic panel     Status: Abnormal   Collection Time: 12/31/23 12:32 PM  Result Value Ref Range   Sodium 143 135 - 145 mmol/L   Potassium 3.8 3.5 - 5.1 mmol/L   Chloride 108 98 - 111 mmol/L   CO2 23 22 - 32 mmol/L   Glucose, Bld 156 (H) 70 - 99 mg/dL    Comment: Glucose reference range applies only to  samples taken after fasting for at least 8 hours.   BUN 48 (H) 8 - 23 mg/dL   Creatinine, Ser 7.77 (H) 0.61 - 1.24 mg/dL   Calcium  10.2 8.9 - 10.3 mg/dL   GFR, Estimated 28 (L) >60 mL/min    Comment: (NOTE) Calculated using the CKD-EPI Creatinine Equation (2021)    Anion gap 12 5 - 15    Comment: Performed at [facility], [address] [place], [place] [address]  Protime-INR     Status: Abnormal   Collection Time: 12/31/23 12:32 PM  Result Value Ref Range   Prothrombin Time 15.7 (H) 11.4 - 15.2 seconds   INR 1.2 0.8 - 1.2    Comment: (NOTE) INR goal varies based on device and disease states. Performed at [facility], [address] [place], [place] [address]     No results found.  ROS: as above  Blood pressure (!) 145/73, pulse 67, temperature 97.7 F (36.5 C), temperature source Oral, resp. rate 16, SpO2 95%.  PHYSICAL EXAM: CONSTITUTIONAL: well developedand oriented x 3 CARDIOVASCULAR: normal rate PULMONARY/CHEST WALL: effort normal and no stridor, no stertor, no dysphonia HENT: Head : normocephalic and atraumatic Nose: right nasal pack in place, hemostatic; no bleeding left nasal cavity or prominent septal vessels Mouth/Throat:  Mouth: uvula midline and no oral lesions Throat: oropharynx clear, no bleeding from posterior OP Mucous membranes: normal EYES: conjunctiva normal NECK: no masses noted  Studies Reviewed: Dr. Karis notes Apr 17, 2023: endo cauterization of right septum CBC 12/31/2023: Hgb 12.4, Plt 142  Assessment/Plan: 89 y.o. with:  Recurrent right epistaxis - Hemostatic after pack on right - Recommend pack stay in place for 5 days - Reduce nasal manipulation - no nose picking, no tissues in the nose other than for dabbing.  - Use Afrin nasal spray for ACTIVE bleeding on the bleeding side and hold firm, continuous nasal pressure (on nostril - soft part of the nose, not the bone)  for 15 mins continuously for  ACTIVE bleeding.  - Frequent nasal saline spray (every 4-6 hours as needed) to keep nose moist. - Apply over-the-counter nasal saline GEL (such as Ayr GEL) to the nostrils (use pea sized amount on pinkie finger and put on the nostril - not the middle part of the septum) and then gently pinch the nostril. Do at least 3 times per day but can do it as many times as you want - May use humidifer in bedroom at night. - Maintain tight BP control  - Recommend keflex  x7d  -  ENT will request f/u  Eldora KATHEE Blanch   12/31/2023, 8:30 PM   MDM: mod - review of note, lab

## 2023-12-31 NOTE — Discharge Instructions (Addendum)
 Thank you for letting us  evaluate you today.  Please follow-up with ENT next week.  I provided you with antibiotics.  Please take as prescribed.  Return to Emergency Department if you experience bleeding that does not stop, worsening symptoms

## 2023-12-31 NOTE — ED Provider Notes (Signed)
 Received patient in signout from previous provider pending ENT follow-up.  See her note.  In short, patient presents Emergency Department for evaluation of epistaxis from right nostril with large clots and spitting up large clots as well.  Received Afrin with EMS with no improvement.  Hgb 12.4.  Hemodynamically stable with no tachycardia nor hypotension.  ENT Dr. Tobie individually assessed patient and recommends patient to go home with [facility], Keflex  for 1 week and have him follow-up next week.  Discussed ED workup, disposition, return to ED precautions with patient who expresses understanding agrees with plan.  All questions answered to their satisfaction.  They are agreeable to plan.  Discharge instructions provided on paperwork   Minnie Tinnie BRAVO, PA 12/31/23 [place]    Gennaro Duwaine CROME, DO 01/03/24 [phone]

## 2023-12-31 NOTE — ED Provider Notes (Signed)
 [place] EMERGENCY DEPARTMENT AT [facility] Provider Note   CSN: [id number] Arrival date & time: 12/31/23  1041     Patient presents with: Epistaxis   Nicholas Patton is a 89 y.o. male.   HPI 89 year old with a history of hyperlipidemia, hypertension, DM type II with neuropathy, BPH, CKD 4 recurrent epistaxis, s/p spinal fusion, COPD with emphysema presents to the emergency department complaints of epistaxis since 3 AM.  Patient presenting with active nosebleed out of right nare with large clots, spitting up large clots as well.  He reports this happened last year, required packing and cauterization x 4 2025, seen by Dr. Tobie in the past.  He received Afrin with EMS with no improvement.  He is not anticoagulated.    Prior to Admission medications   Medication Sig Start Date End Date Taking? Authorizing Provider  albuterol  (VENTOLIN  HFA) 108 (90 Base) MCG/ACT inhaler INHALE 2 PUFFS BY MOUTH EVERY 6 HOURS AS NEEDED FOR WHEEZING FOR SHORTNESS OF BREATH 08/26/22   Hunsucker, Donnice JONELLE, MD  allopurinol  (ZYLOPRIM ) 100 MG tablet Take 1 tablet (100 mg total) by mouth daily. 01/09/22   Corey, Evan S, MD  atorvastatin  (LIPITOR) 40 MG tablet TAKE 1 TABLET BY MOUTH DAILY 11/24/23   Norleen Lynwood ORN, MD  betamethasone  dipropionate 0.05 % cream Apply topically 2 (two) times daily. Patient not taking: Reported on 12/20/2023 07/07/23   Norleen Lynwood ORN, MD  BREO ELLIPTA  200-25 MCG/ACT AEPB USE 1 INHALATION BY MOUTH ONCE  DAILY AT THE SAME TIME EACH DAY 03/18/23   Hunsucker, Donnice JONELLE, MD  carvedilol  (COREG ) 12.5 MG tablet TAKE 1 TABLET BY MOUTH TWICE  DAILY WITH MEALS 06/07/23   Norleen Lynwood ORN, MD  cholecalciferol  (VITAMIN D3) 25 MCG (1000 UNIT) tablet Take 1,000 Units by mouth daily. 06/02/23: Reports during TOC call taking OTC 2,000 Units every day    Norleen Lynwood ORN, MD  doxazosin  (CARDURA ) 2 MG tablet Take 1 tablet (2 mg total) by mouth daily. 10/04/20   Norleen Lynwood ORN, MD  ferrous sulfate 325 (65  FE) MG EC tablet Take 325 mg by mouth 3 (three) times daily with meals. 06/18/23: Daughter reports nephrology provider prescribed iron 325 mg OTC at time of office visit on 06/16/23 Patient not taking: Reported on 12/20/2023    Norleen Lynwood ORN, MD  finasteride  (PROSCAR ) 5 MG tablet TAKE 1 TABLET BY MOUTH DAILY 09/27/22   Watt Norleen, MD  furosemide  (LASIX ) 40 MG tablet Take 1 tab by mouth in the AM every day, and 1 tab by mouth in the afternoon for persistent swelling as needed 12/15/23   Norleen Lynwood ORN, MD  glimepiride  (AMARYL ) 2 MG tablet TAKE 2 TABLETS BY MOUTH DAILY  BEFORE BREAKFAST 05/25/23   Norleen Lynwood ORN, MD  levothyroxine  (SYNTHROID ) 50 MCG tablet TAKE 1 TABLET BY MOUTH DAILY 09/07/23   Norleen Lynwood ORN, MD  losartan  (COZAAR ) 100 MG tablet TAKE 1 TABLET BY MOUTH DAILY 04/05/23   Norleen Lynwood ORN, MD  pantoprazole  (PROTONIX ) 40 MG tablet TAKE 1 TABLET BY MOUTH DAILY 11/24/23   Norleen Lynwood ORN, MD  pioglitazone  (ACTOS ) 30 MG tablet TAKE 1 TABLET BY MOUTH DAILY 09/17/23   Norleen Lynwood ORN, MD  vitamin B-12 (CYANOCOBALAMIN ) 100 MCG tablet Take 100 mcg by mouth daily.    [provider]    Allergies: Amlodipine , Cymbalta  [duloxetine  hcl], and Oxycodone     Review of Systems Ten systems reviewed and are negative for acute change,  except as noted in the HPI.   Updated Vital Signs BP (!) 161/82   Pulse 79   Temp 97.6 F (36.4 C) (Oral)   Resp 17   SpO2 96%   Physical Exam Vitals and nursing note reviewed.  Constitutional:      General: He is not in acute distress.    Appearance: He is well-developed.  HENT:     Head: Normocephalic and atraumatic.     Comments: Exam difficult due to extent of bleeding, large clots with active bleeding out of right nare, patient actively spitting up large clots, protecting airway Eyes:     Conjunctiva/sclera: Conjunctivae normal.  Cardiovascular:     Rate and Rhythm: Normal rate and regular rhythm.     Heart sounds: No murmur heard. Pulmonary:     Effort:  Pulmonary effort is normal. No respiratory distress.     Breath sounds: Normal breath sounds.  Abdominal:     Palpations: Abdomen is soft.     Tenderness: There is no abdominal tenderness.  Musculoskeletal:        General: No swelling.     Cervical back: Neck supple.  Skin:    General: Skin is warm and dry.     Capillary Refill: Capillary refill takes less than 2 seconds.  Neurological:     Mental Status: He is alert.  Psychiatric:        Mood and Affect: Mood normal.     (all labs ordered are listed, but only abnormal results are displayed) Labs Reviewed  CBC - Abnormal; Notable for the following components:      Result Value   RBC 4.13 (*)    Hemoglobin 12.4 (*)    Platelets 142 (*)    All other components within normal limits  BASIC METABOLIC PANEL WITH GFR - Abnormal; Notable for the following components:   Glucose, Bld 156 (*)    BUN 48 (*)    Creatinine, Ser 2.22 (*)    GFR, Estimated 28 (*)    All other components within normal limits  PROTIME-INR - Abnormal; Notable for the following components:   Prothrombin Time 15.7 (*)    All other components within normal limits    EKG: None  Radiology: No results found.   Epistaxis Management  Date/Time: 12/31/2023 1:40 PM  Performed by: Vonn Hadassah LABOR, PA-C Authorized by: Vonn Hadassah LABOR, PA-C   Consent:    Consent obtained:  Verbal   Consent given by:  Patient   Risks discussed:  Bleeding, infection, nasal injury and pain   Alternatives discussed:  No treatment Universal protocol:    Patient identity confirmed:  Verbally with patient Anesthesia:    Anesthesia method:  None Procedure details:    Treatment site:  R anterior   Repair method: Rhino rocket.   Treatment episode: recurring   Post-procedure details:    Assessment:  Bleeding decreased Comments:     Initially placed medium rhinorocket but continued to have bleeding. Exchanged for large after having patient blow out large clots and sprayed Afrin  in right nare, followed by placement of large rhinorocket by Dr. Rogelia. Bleeding decreased     Medications Ordered in the ED  tranexamic acid (CYKLOKAPRON) 1000 MG/10ML topical solution 500 mg (500 mg Topical Given by Other 12/31/23 1204)  oxymetazoline  (AFRIN) 0.05 % nasal spray 1 spray (1 spray Each Nare Given by Other 12/31/23 1234)  Medical Decision Making Amount and/or Complexity of Data Reviewed Labs: ordered.  Risk OTC drugs.   89 year old male presenting with large volume acute epistaxis.  He had Afrin with EMS with little improvement.  Attempted to hold pressure with still ongoing heavy bleeding with large clots out of right nare, patient coughing up large clots.   Labs reviewed, BMP with glucose of 156, creatinine of 2.22 which appears to be at baseline.  Hemoglobin of 12.4, which is actually somewhat better from prior.  Platelets are 142 which appears to be slightly down from several months ago.  INR 1.2, PT 15.7. SABRA Attempted to place TXA soaked medium size Rhino Rocket, but had continuing bleeding surrounding the [employer].  With the help of Dr. Joellyn, patient had large-volume Afrin in the right nare followed by exchange of Rhino Rocket to large with improvement in bleeding however still had some slow oozing.  No balloon Rhino Rocket's available at this facility.  I spoke with Dr. Tobie with ENT who is currently in the OR, but will happily see the patient once he is finished though this may be several hours.  Will continue to observe in the emergency department for now, if he starts to actively heavily bleeding and then will transfer to [facility] for more urgent ENT intervention.  Care signed out to oncoming team awaiting ENT eval      Final diagnoses:  Epistaxis    ED Discharge Orders     None          Vonn Hadassah LABOR, PA-C 12/31/23 1347    Rogelia Jerilynn RAMAN, MD 12/31/23 1655

## 2023-12-31 NOTE — ED Triage Notes (Signed)
 Pt BIBA from home for epistaxis starting around 3am. Happened before many years ago. No trauma. No thinners. Slight headache starting at the same time. 2 sprays afrin in right nare. Seems to be bleeding just from right side. Spitting out blood occasionally  156/80 Hr 80 98% ra

## 2024-01-05 ENCOUNTER — Ambulatory Visit (INDEPENDENT_AMBULATORY_CARE_PROVIDER_SITE_OTHER): Admitting: Otolaryngology

## 2024-01-06 ENCOUNTER — Encounter (INDEPENDENT_AMBULATORY_CARE_PROVIDER_SITE_OTHER): Payer: Self-pay | Admitting: Otolaryngology

## 2024-01-06 ENCOUNTER — Ambulatory Visit (INDEPENDENT_AMBULATORY_CARE_PROVIDER_SITE_OTHER): Admitting: Otolaryngology

## 2024-01-06 VITALS — BP 116/65 | HR 88 | Ht 74.0 in | Wt 193.0 lb

## 2024-01-06 DIAGNOSIS — R040 Epistaxis: Secondary | ICD-10-CM

## 2024-01-12 ENCOUNTER — Encounter: Payer: Self-pay | Admitting: Podiatry

## 2024-01-12 ENCOUNTER — Ambulatory Visit: Admitting: Podiatry

## 2024-01-12 DIAGNOSIS — L84 Corns and callosities: Secondary | ICD-10-CM | POA: Diagnosis not present

## 2024-01-12 DIAGNOSIS — M79674 Pain in right toe(s): Secondary | ICD-10-CM | POA: Diagnosis not present

## 2024-01-12 DIAGNOSIS — E1142 Type 2 diabetes mellitus with diabetic polyneuropathy: Secondary | ICD-10-CM | POA: Diagnosis not present

## 2024-01-12 DIAGNOSIS — B351 Tinea unguium: Secondary | ICD-10-CM

## 2024-01-12 DIAGNOSIS — M79675 Pain in left toe(s): Secondary | ICD-10-CM | POA: Diagnosis not present

## 2024-01-16 NOTE — Progress Notes (Signed)
 Dear Dr. Norleen, Here is my assessment for our mutual patient, Nicholas Patton. Thank you for allowing me the opportunity to care for your patient. Please do not hesitate to contact me should you have any other questions. Sincerely, Dr. Eldora Blanch  Otolaryngology Clinic Note Referring provider: Dr. Norleen HPI:  Nicholas Patton is a 89 y.o. male kindly referred by Dr. Norleen for evaluation of epistaxis  ED visit (12/2023): Epistaxis history: started around 2 AM, randomly. No trauma, no nose blowing. Nose did feel a bit dry. From right side. Has h/o right sided epistaxis, requiring cautery by Dr. Karis x2 this past winter. Tried afrin, did not help. Came to ED -- packed x2, after last pack hemostatic for past few hours. ENT consulted given his epistaxis hx. Does have COPD requiring PRN O2 per him and daughter but did not use it recently HTN: y CKD/Liver dysfunction: y Anticoagulation/AP: no Trauma: no History of Sinusitis: no Nasal obstruction: no Nasal procedures: see above Current nasal medication use: uses saline spray PRN  Initial outpatient visit with me (12/2023): Returns for follow up. No further bleeding from right. No bleeding from left. No issues with frequent sinus infections or congestion.  ENT Procedures: nasal cautery with Teoh x2 (2025) Personal or FHx of bleeding dz or anesthesia difficulty: no  GLP-1: no AP/AC: no  Tobacco: former, quit  Independent Review of Additional Tests or Records:  Dr. Karis notes (04/07/2023) and 05/10/2023 reviewed - endoscopic cautery of septum x2; prior also seen in 2024 multiple times.  12/31/2023 - PT/INR 15.7/1.2; BUN/Cr 48/2.22; WBC 4.0, Hgb 12.4, Plt 142 MRI Brain 09/08/20 independently interpreted with respect to sinuses: generally clear paranasal sinuses; no noted large masses; study suboptimal because no contrast  PMH/Meds/All/SocHx/FamHx/ROS:   Past Medical History:  Diagnosis Date   Anemia    Arthritis    Cataract    REMOVED BILATERAL    Colon polyps    COPD (chronic obstructive pulmonary disease) (HCC)    DIABETES MELLITUS, TYPE II    takes Metformin  and Glimepiride  daily   Diverticulosis    GERD (gastroesophageal reflux disease)    takes Protonix  daily   History of colon polyps    HYPERLIPIDEMIA    takes Atorvastatin  daily   HYPERTENSION    takes Amlodipine  and Hyzaar daily   Ileus following gastrointestinal surgery (HCC) 07/22/2012   Joint pain    Peripheral neuropathy    Peripheral neuropathy    PERIPHERAL NEUROPATHY, FEET 10/06/2007   Renal disorder    VITAMIN B12 DEFICIENCY 09/06/2008   VITAMIN D  DEFICIENCY 01/16/2010   takes Vitamin D  daily     Past Surgical History:  Procedure Laterality Date   CATARACT EXTRACTION     both eyes   CHOLECYSTECTOMY N/A 07/18/2012   Procedure: LAPAROSCOPIC CHOLECYSTECTOMY WITH INTRAOPERATIVE CHOLANGIOGRAM;  Surgeon: Dann FORBES Hummer, MD;  Location: [facility] OR;  Service: General;  Laterality: N/A;   COLONOSCOPY     EYE SURGERY Right 01/2023   POLYPECTOMY     POSTERIOR CERVICAL FUSION/FORAMINOTOMY N/A 06/27/2015   Procedure: Posterior Cervical Fusion with lateral mass fixation Cervical four to cervical seven;  Surgeon: Alm GORMAN Molt, MD;  Location: [facility] NEURO ORS;  Service: Neurosurgery;  Laterality: N/A;   TOTAL KNEE ARTHROPLASTY  2001   Left   TURP VAPORIZATION      Family History  Problem Relation Age of Onset   Diabetes Mellitus II Mother    Lung cancer Brother    Colon cancer Neg Hx  Esophageal cancer Neg Hx    Rectal cancer Neg Hx    Stomach cancer Neg Hx      Social Connections: Moderately Integrated (11/17/2023)   Social Connection and Isolation Panel    Frequency of Communication with Friends and Family: More than three times a week    Frequency of Social Gatherings with Friends and Family: More than three times a week    Attends Religious Services: More than 4 times per year    Active Member of [employer] or Organizations: Yes    Attends Banker  Meetings: More than 4 times per year    Marital Status: Widowed      Current Outpatient Medications:    albuterol  (VENTOLIN  HFA) 108 (90 Base) MCG/ACT inhaler, INHALE 2 PUFFS BY MOUTH EVERY 6 HOURS AS NEEDED FOR WHEEZING FOR SHORTNESS OF BREATH, Disp: 18 g, Rfl: 2   allopurinol  (ZYLOPRIM ) 100 MG tablet, Take 1 tablet (100 mg total) by mouth daily., Disp: 90 tablet, Rfl: 1   atorvastatin  (LIPITOR) 40 MG tablet, TAKE 1 TABLET BY MOUTH DAILY, Disp: 90 tablet, Rfl: 3   BREO ELLIPTA  200-25 MCG/ACT AEPB, USE 1 INHALATION BY MOUTH ONCE  DAILY AT THE SAME TIME EACH DAY, Disp: 180 each, Rfl: 3   carvedilol  (COREG ) 12.5 MG tablet, TAKE 1 TABLET BY MOUTH TWICE  DAILY WITH MEALS, Disp: 180 tablet, Rfl: 3   cholecalciferol  (VITAMIN D3) 25 MCG (1000 UNIT) tablet, Take 1,000 Units by mouth daily. 06/02/23: Reports during TOC call taking OTC 2,000 Units every day, Disp: , Rfl:    doxazosin  (CARDURA ) 2 MG tablet, Take 1 tablet (2 mg total) by mouth daily., Disp: 90 tablet, Rfl: 3   finasteride  (PROSCAR ) 5 MG tablet, TAKE 1 TABLET BY MOUTH DAILY, Disp: 90 tablet, Rfl: 3   furosemide  (LASIX ) 40 MG tablet, Take 1 tab by mouth in the AM every day, and 1 tab by mouth in the afternoon for persistent swelling as needed, Disp: 180 tablet, Rfl: 1   glimepiride  (AMARYL ) 2 MG tablet, TAKE 2 TABLETS BY MOUTH DAILY  BEFORE BREAKFAST, Disp: 180 tablet, Rfl: 3   levothyroxine  (SYNTHROID ) 50 MCG tablet, TAKE 1 TABLET BY MOUTH DAILY, Disp: 90 tablet, Rfl: 3   losartan  (COZAAR ) 100 MG tablet, TAKE 1 TABLET BY MOUTH DAILY, Disp: 90 tablet, Rfl: 3   pantoprazole  (PROTONIX ) 40 MG tablet, TAKE 1 TABLET BY MOUTH DAILY, Disp: 90 tablet, Rfl: 3   pioglitazone  (ACTOS ) 30 MG tablet, TAKE 1 TABLET BY MOUTH DAILY, Disp: 90 tablet, Rfl: 3   vitamin B-12 (CYANOCOBALAMIN ) 100 MCG tablet, Take 100 mcg by mouth daily., Disp: , Rfl:    betamethasone  dipropionate 0.05 % cream, Apply topically 2 (two) times daily., Disp: 30 g, Rfl: 1   ferrous  sulfate 325 (65 FE) MG EC tablet, Take 325 mg by mouth 3 (three) times daily with meals. 06/18/23: Daughter reports nephrology provider prescribed iron 325 mg OTC at time of office visit on 06/16/23, Disp: , Rfl:    Physical Exam:   BP 116/65 (BP Location: Left Arm, Patient Position: Sitting, Cuff Size: Large)   Pulse 88   Ht 6' 2 (1.88 m)   Wt 193 lb (87.5 kg)   SpO2 (!) 76%   BMI 24.78 kg/m   Salient findings:  CN II-XII intact Bilateral EAC clear and TM intact with well pneumatized middle ear spaces Anterior rhinoscopy: Right nasal pack removed after spraying the pack with afrin and lidocaine  4%; after removal, Septum relatively  midline; bilateral inferior turbinates without significant hypertrophy; no active bleeding noted but some mucosal trauma right inferior turbinate; mild prominent septal vessels on right today; Nasal endoscopy was indicated to better evaluate the nose and paranasal sinuses, given the patient's history and exam findings, and is detailed below. No lesions of oral cavity/oropharynx No obviously palpable neck masses/lymphadenopathy/thyromegaly No respiratory distress or stridor  Seprately Identifiable Procedures:  Prior to initiating any procedures, risks/benefits/alternatives were explained to the patient and verbal consent obtained. PROCEDURE: Bilateral Diagnostic Rigid Nasal Endoscopy Pre-procedure diagnosis: Concern for epistaxis Post-procedure diagnosis: same Indication: See pre-procedure diagnosis and physical exam above Complications: None apparent EBL: 0 mL Anesthesia: Lidocaine  4% and topical decongestant was topically sprayed in each nasal cavity  Description of Procedure:  Patient was identified. After pack removal as above, a rigid 30 degree endoscope was utilized to evaluate the sinonasal cavities, mucosa, sinus ostia and turbinates and septum.  Overall, signs of mucosal inflammation are noted.  Also noted are prominent septal vessel and inferior  turbinate mucosal trauma right.  No mucopurulence, polyps, or masses noted.   Right Middle meatus: clear Right SE Recess: clear Left MM: clear Left SE Recess: clear Photodocumentation was obtained.  CPT CODE -- 68768 - Mod 25   Impression & Plans:  Nicholas Patton is a 89 y.o. male with:  1. Epistaxis    Cauterized multiple times, now recently packed; pack removed. Likely multifactorial as a result of dryness in setting of kidney dysfunction. Will wait to see how the nose heals and then decide to see if he'd benefit from cautery. F/u in 6 weeks; recommend saline nasal spray multiple times per day in the interim  See below regarding exact medications prescribed this encounter including dosages and route: No orders of the defined types were placed in this encounter.     Thank you for allowing me the opportunity to care for your patient. Please do not hesitate to contact me should you have any other questions.  Sincerely, Eldora Blanch, MD Otolaryngologist (ENT), [facility] Phone: [phone] Fax: [phone]  01/16/2024, 2:15 PM   MDM:  I have personally spent 30 minutes involved in face-to-face and non-face-to-face activities for this patient on the day of the visit.  Professional time spent excludes any procedures performed but includes the following activities, in addition to those noted in the documentation: preparing to see the patient (review of outside documentation and results), performing a medically appropriate examination, counseling, documenting in the electronic health record, independently interpreting results

## 2024-01-17 ENCOUNTER — Encounter: Payer: Self-pay | Admitting: Podiatry

## 2024-01-17 NOTE — Progress Notes (Incomplete)
  Subjective:  Patient ID: Nicholas Patton, male    DOB: 29-May-1934,  MRN: [id number]  Nicholas Patton presents to clinic today for at risk foot care with history of diabetic neuropathy and corn(s) L hallux and left second digit and painful mycotic toenails that are difficult to trim. Painful toenails interfere with ambulation. Aggravating factors include wearing enclosed shoe gear. Pain is relieved with periodic professional debridement. Painful corns are aggravated when weightbearing when wearing enclosed shoe gear. Pain is relieved with periodic professional debridement.  Chief Complaint  Patient presents with  . Diabetes    DFC NIDDM A1C 6.5. Toenail trim. LOV with PCP 12/20/23.   New problem(s): None.   PCP is Norleen Lynwood ORN, MD.  Allergies  Allergen Reactions  . Amlodipine  Swelling  . Cymbalta  [Duloxetine  Hcl] Other (See Comments)  . Oxycodone  Other (See Comments)    Review of Systems: Negative except as noted in the HPI.  Objective: No changes noted in today's physical examination. There were no vitals filed for this visit. Nicholas Patton is a pleasant 89 y.o. male WD, WN in NAD. AAO x 3.  Vascular Examination: Capillary refill time immediate b/l. Palpable pedal pulses. Pedal hair absent b/l. No pain with calf compression b/l. Skin temperature gradient WNL b/l. No cyanosis or clubbing b/l. No ischemia or gangrene noted b/l. Trace edema noted BLE.  Neurological Examination: Pt has subjective symptoms of neuropathy. Protective sensation decreased with 10 gram monofilament b/l.  Dermatological Examination: Pedal skin with normal turgor, texture and tone b/l.  No open wounds. No interdigital macerations.   Toenails 1-5 b/l thick, discolored, elongated with subungual debris and pain on dorsal palpation.   No hyperkeratotic nor porokeratotic lesions.  Musculoskeletal Examination: Muscle strength 5/5 to all lower extremity muscle groups bilaterally. HAV with bunion deformity noted  b/l LE. Hammertoe(s) 2-5 b/l.SABRA No pain, crepitus or joint limitation noted with ROM b/l LE.  Patient ambulates independently without assistive aids.  Radiographs: None  Assessment/Plan: 1. Pain due to onychomycosis of toenails of both feet   2. Diabetic peripheral neuropathy associated with type 2 diabetes mellitus (HCC)   Patient was evaluated and treated. All patient's and/or POA's questions/concerns addressed on today's visit. Toenails 1-5 b/l debrided in length and girth without incident. Corn(s) {[id number]:[id number]} pared with sharp debridement without incident. Continue foot and shoe inspections daily. Monitor blood glucose per PCP/Endocrinologist's recommendations. Continue soft, supportive shoe gear daily. Report any pedal injuries to medical professional. Call office if there are any questions/concerns. -Patient/POA to call should there be question/concern in the interim.   Return in about 3 months (around 04/13/2024).  Nicholas Patton, DPM      [facility] LOCATION: [address], [place] [address]                   Office [phone]   [facility] LOCATION: [address] [place], [place] [address] Office [phone]

## 2024-01-17 NOTE — Progress Notes (Signed)
  Subjective:  Patient ID: Nicholas Patton, male    DOB: 1934/10/22,  MRN: [id number]  Nicholas Patton presents to clinic today for at risk foot care with history of diabetic neuropathy and corn(s) L hallux and left second digit and painful mycotic toenails that are difficult to trim. Painful toenails interfere with ambulation. Aggravating factors include wearing enclosed shoe gear. Pain is relieved with periodic professional debridement. Painful corns are aggravated when weightbearing when wearing enclosed shoe gear. Pain is relieved with periodic professional debridement.  Chief Complaint  Patient presents with   Diabetes    DFC NIDDM A1C 6.5. Toenail trim. LOV with PCP 12/20/23.   New problem(s): None.   PCP is Norleen Lynwood ORN, MD.  Allergies  Allergen Reactions   Amlodipine  Swelling   Cymbalta  [Duloxetine  Hcl] Other (See Comments)   Oxycodone  Other (See Comments)    Review of Systems: Negative except as noted in the HPI.  Objective: No changes noted in today's physical examination. There were no vitals filed for this visit. Nicholas Patton is a pleasant 89 y.o. male WD, WN in NAD. AAO x 3.  Vascular Examination: Capillary refill time immediate b/l. Palpable pedal pulses. Pedal hair absent b/l. No pain with calf compression b/l. Skin temperature gradient WNL b/l. No cyanosis or clubbing b/l. No ischemia or gangrene noted b/l. Trace edema noted BLE.  Neurological Examination: Pt has subjective symptoms of neuropathy. Protective sensation decreased with 10 gram monofilament b/l.  Dermatological Examination: Pedal skin with normal turgor, texture and tone b/l.  No open wounds. No interdigital macerations.   Toenails 1-5 b/l thick, discolored, elongated with subungual debris and pain on dorsal palpation.   Hyperkeratotic lesion(s) lateral IPJ of left great toe and medial PIPJ of left second digit.  No erythema, no edema, no drainage, no fluctuance.  Musculoskeletal  Examination: Muscle strength 5/5 to all lower extremity muscle groups bilaterally. HAV with bunion deformity noted b/l LE. Hammertoe(s) 2-5 b/l.Nicholas Patton No pain, crepitus or joint limitation noted with ROM b/l LE.  Patient ambulates independently without assistive aids.  Radiographs: None  Assessment/Plan: 1. Pain due to onychomycosis of toenails of both feet   2. Diabetic peripheral neuropathy associated with type 2 diabetes mellitus (HCC)   Patient was evaluated and treated. All patient's and/or POA's questions/concerns addressed on today's visit. Toenails 1-5 b/l debrided in length and girth without incident. Corn(s) lateral IPJ of L hallux and medial DIPJ of medial PIPJ of left second digit pared with sharp debridement without incident. Treatment was provided by assistant Nicholas Patton under my supervision. Continue foot and shoe inspections daily. Monitor blood glucose per PCP/Endocrinologist's recommendations. Continue soft, supportive shoe gear daily. Report any pedal injuries to medical professional. Call office if there are any questions/concerns.  Return in about 3 months (around 04/13/2024).  Nicholas Patton, DPM      [facility] LOCATION: [address], [place] [address]                   Office [phone]   [facility] LOCATION: [address] [place], [place] [address] Office [phone]

## 2024-02-18 ENCOUNTER — Other Ambulatory Visit: Payer: Self-pay | Admitting: Internal Medicine

## 2024-02-22 ENCOUNTER — Encounter (INDEPENDENT_AMBULATORY_CARE_PROVIDER_SITE_OTHER): Payer: Self-pay | Admitting: Otolaryngology

## 2024-02-22 ENCOUNTER — Ambulatory Visit (INDEPENDENT_AMBULATORY_CARE_PROVIDER_SITE_OTHER): Admitting: Otolaryngology

## 2024-02-22 VITALS — BP 148/73 | HR 82 | Ht 74.0 in | Wt 193.0 lb

## 2024-02-22 DIAGNOSIS — H9190 Unspecified hearing loss, unspecified ear: Secondary | ICD-10-CM

## 2024-02-22 DIAGNOSIS — R040 Epistaxis: Secondary | ICD-10-CM

## 2024-02-22 DIAGNOSIS — H9193 Unspecified hearing loss, bilateral: Secondary | ICD-10-CM | POA: Diagnosis not present

## 2024-02-22 NOTE — Progress Notes (Signed)
 Dear Dr. Norleen, Here is my assessment for our mutual patient, Nicholas Patton. Thank you for allowing me the opportunity to care for your patient. Please do not hesitate to contact me should you have any other questions. Sincerely, Dr. Eldora Blanch  Otolaryngology Clinic Note Referring provider: Dr. Norleen HPI:  Nicholas Patton is a 89 y.o. male kindly referred by Dr. Norleen for evaluation of epistaxis  ED visit (12/2023): Epistaxis history: started around 2 AM, randomly. No trauma, no nose blowing. Nose did feel a bit dry. From right side. Has h/o right sided epistaxis, requiring cautery by Dr. Karis x2 this past winter. Tried afrin, did not help. Came to ED -- packed x2, after last pack hemostatic for past few hours. ENT consulted given his epistaxis hx. Does have COPD requiring PRN O2 per him and daughter but did not use it recently HTN: y CKD/Liver dysfunction: y Anticoagulation/AP: no Trauma: no History of Sinusitis: no Nasal obstruction: no Nasal procedures: see above Current nasal medication use: uses saline spray PRN  Initial outpatient visit with me (12/2023): Returns for follow up. No further bleeding from right. No bleeding from left. No issues with frequent sinus infections or congestion.  --------------------------------------------------------- 02/22/2024 He reports no further bleeding. He has been using a humidfier and ayr gel. He is scared to use his O2. Also reports b/l chronic long-standing HL, left side, getting worse.  Patient denies: ear pain, fullness, vertigo, drainage Patient additionally denies: deep pain in ear canal, eustachian tube symptoms such as popping, crackling, sensitive to pressure changes Patient also denies barotrauma, vestibular suppressant use, ototoxic medication use   ENT Procedures: nasal cautery with [place] x2 (2025) Personal or FHx of bleeding dz or anesthesia difficulty: no  GLP-1: no AP/AC: no  Tobacco: former, quit  Independent Review of  Additional Tests or Records:  Dr. Karis notes (04/07/2023) and 05/10/2023 reviewed - endoscopic cautery of septum x2; prior also seen in 2024 multiple times.  12/31/2023 - PT/INR 15.7/1.2; BUN/Cr 48/2.22; WBC 4.0, Hgb 12.4, Plt 142 MRI Brain 09/08/20 independently interpreted with respect to sinuses: generally clear paranasal sinuses; no noted large masses; study suboptimal because no contrast  PMH/Meds/All/SocHx/FamHx/ROS:   Past Medical History:  Diagnosis Date   Anemia    Arthritis    Cataract    REMOVED BILATERAL   Colon polyps    COPD (chronic obstructive pulmonary disease) (HCC)    DIABETES MELLITUS, TYPE II    takes Metformin  and Glimepiride  daily   Diverticulosis    GERD (gastroesophageal reflux disease)    takes Protonix  daily   History of colon polyps    HYPERLIPIDEMIA    takes Atorvastatin  daily   HYPERTENSION    takes Amlodipine  and Hyzaar daily   Ileus following gastrointestinal surgery (HCC) 07/22/2012   Joint pain    Peripheral neuropathy    Peripheral neuropathy    PERIPHERAL NEUROPATHY, FEET 10/06/2007   Renal disorder    VITAMIN B12 DEFICIENCY 09/06/2008   VITAMIN D  DEFICIENCY 01/16/2010   takes Vitamin D  daily     Past Surgical History:  Procedure Laterality Date   CATARACT EXTRACTION     both eyes   CHOLECYSTECTOMY N/A 07/18/2012   Procedure: LAPAROSCOPIC CHOLECYSTECTOMY WITH INTRAOPERATIVE CHOLANGIOGRAM;  Surgeon: Dann FORBES Hummer, MD;  Location: [facility] OR;  Service: General;  Laterality: N/A;   COLONOSCOPY     EYE SURGERY Right 01/2023   POLYPECTOMY     POSTERIOR CERVICAL FUSION/FORAMINOTOMY N/A 06/27/2015   Procedure: Posterior Cervical Fusion with lateral mass  fixation Cervical four to cervical seven;  Surgeon: Alm GORMAN Molt, MD;  Location: [facility];  Service: Neurosurgery;  Laterality: N/A;   TOTAL KNEE ARTHROPLASTY  2001   Left   TURP VAPORIZATION      Family History  Problem Relation Age of Onset   Diabetes Mellitus II Mother    Lung cancer  Brother    Colon cancer Neg Hx    Esophageal cancer Neg Hx    Rectal cancer Neg Hx    Stomach cancer Neg Hx      Social Connections: Moderately Integrated (11/17/2023)   Social Connection and Isolation Panel    Frequency of Communication with Friends and Family: More than three times a week    Frequency of Social Gatherings with Friends and Family: More than three times a week    Attends Religious Services: More than 4 times per year    Active Member of [employer] or Organizations: Yes    Attends Banker Meetings: More than 4 times per year    Marital Status: Widowed      Current Outpatient Medications:    albuterol  (VENTOLIN  HFA) 108 (90 Base) MCG/ACT inhaler, INHALE 2 PUFFS BY MOUTH EVERY 6 HOURS AS NEEDED FOR WHEEZING FOR SHORTNESS OF BREATH, Disp: 18 g, Rfl: 2   allopurinol  (ZYLOPRIM ) 100 MG tablet, Take 1 tablet (100 mg total) by mouth daily., Disp: 90 tablet, Rfl: 1   atorvastatin  (LIPITOR) 40 MG tablet, TAKE 1 TABLET BY MOUTH DAILY, Disp: 90 tablet, Rfl: 3   betamethasone  dipropionate 0.05 % cream, Apply topically 2 (two) times daily., Disp: 30 g, Rfl: 1   BREO ELLIPTA  200-25 MCG/ACT AEPB, USE 1 INHALATION BY MOUTH ONCE  DAILY AT THE SAME TIME EACH DAY, Disp: 180 each, Rfl: 3   carvedilol  (COREG ) 12.5 MG tablet, TAKE 1 TABLET BY MOUTH TWICE  DAILY WITH MEALS, Disp: 180 tablet, Rfl: 3   cholecalciferol  (VITAMIN D3) 25 MCG (1000 UNIT) tablet, Take 1,000 Units by mouth daily. 06/02/23: Reports during TOC call taking OTC 2,000 Units every day, Disp: , Rfl:    doxazosin  (CARDURA ) 2 MG tablet, Take 1 tablet (2 mg total) by mouth daily., Disp: 90 tablet, Rfl: 3   ferrous sulfate 325 (65 FE) MG EC tablet, Take 325 mg by mouth 3 (three) times daily with meals. 06/18/23: Daughter reports nephrology provider prescribed iron 325 mg OTC at time of office visit on 06/16/23, Disp: , Rfl:    finasteride  (PROSCAR ) 5 MG tablet, TAKE 1 TABLET BY MOUTH DAILY, Disp: 90 tablet, Rfl: 3    furosemide  (LASIX ) 40 MG tablet, Take 1 tab by mouth in the AM every day, and 1 tab by mouth in the afternoon for persistent swelling as needed, Disp: 180 tablet, Rfl: 1   glimepiride  (AMARYL ) 2 MG tablet, TAKE 2 TABLETS BY MOUTH DAILY  BEFORE BREAKFAST, Disp: 180 tablet, Rfl: 3   levothyroxine  (SYNTHROID ) 50 MCG tablet, TAKE 1 TABLET BY MOUTH DAILY, Disp: 90 tablet, Rfl: 3   losartan  (COZAAR ) 100 MG tablet, TAKE 1 TABLET BY MOUTH DAILY, Disp: 90 tablet, Rfl: 3   pantoprazole  (PROTONIX ) 40 MG tablet, TAKE 1 TABLET BY MOUTH DAILY, Disp: 90 tablet, Rfl: 3   pioglitazone  (ACTOS ) 30 MG tablet, TAKE 1 TABLET BY MOUTH DAILY, Disp: 90 tablet, Rfl: 3   vitamin B-12 (CYANOCOBALAMIN ) 100 MCG tablet, Take 100 mcg by mouth daily., Disp: , Rfl:    Physical Exam:   BP (!) 148/73 (BP Location: Left Arm,  Patient Position: Sitting, Cuff Size: Large)   Pulse 82   Ht 6' 2 (1.88 m)   Wt 193 lb (87.5 kg)   SpO2 (!) 80%   BMI 24.78 kg/m   Salient findings:  CN II-XII intact Bilateral EAC clear and TM intact with well pneumatized middle ear spaces Weber 512: mid Anterior rhinoscopy: Septum modest deviation, no significant dryness; mild prominent septal vessel b/l caudal septum; bilateral inferior turbinates without significant hypertrophy; Nasal endoscopy was indicated to better evaluate the nose and paranasal sinuses, given the patient's history and exam findings, and is detailed below. No lesions of oral cavity/oropharynx No obviously palpable neck masses/lymphadenopathy/thyromegaly No respiratory distress or stridor  Seprately Identifiable Procedures:  Prior to initiating any procedures, risks/benefits/alternatives were explained to the patient and verbal consent obtained. PROCEDURE: Bilateral Diagnostic Rigid Nasal Endoscopy Pre-procedure diagnosis: Concern for epistaxis, re-evaluate Post-procedure diagnosis: same Indication: See pre-procedure diagnosis and physical exam above Complications: None  apparent EBL: 0 mL Anesthesia: Lidocaine  4% and topical decongestant was topically sprayed in each nasal cavity  Description of Procedure:  Patient was identified. After pack removal as above, a rigid 30 degree endoscope was utilized to evaluate the sinonasal cavities, mucosa, sinus ostia and turbinates and septum.  Overall, signs of mucosal inflammation are noted.  Also noted are prominent septal vessel b/l caudal septum.  No mucopurulence, polyps, or masses noted.   Right Middle meatus: clear Right SE Recess: clear Left MM: clear Left SE Recess: clear  CPT CODE -- 68768 - Mod 25   Impression & Plans:  Nicholas Patton is a 89 y.o. male with:  1. Epistaxis   2. Subjective hearing loss    Cauterized multiple times, now recently packed; pack removed. Likely multifactorial as a result of dryness in setting of kidney dysfunction. Doing fairly well on humidification and no further bleeding so encourage continued humidification -- ayr spray multiple times per day and can even use vaseline; ok to use home O2; don't blow the nose HL: likely SNHL; will get audio  F/u April 2026   See below regarding exact medications prescribed this encounter including dosages and route: No orders of the defined types were placed in this encounter.     Thank you for allowing me the opportunity to care for your patient. Please do not hesitate to contact me should you have any other questions.  Sincerely, Eldora Blanch, MD Otolaryngologist (ENT), [facility], [facility] Phone: [phone] Fax: [phone]  02/22/2024, 9:28 AM   MDM:  I have personally spent 30 minutes involved in face-to-face and non-face-to-face activities for this patient on the day of the visit.  Professional time spent excludes any procedures performed but includes the following activities, in addition to those noted in the documentation: preparing to see the patient (review of outside documentation and results), performing a  medically appropriate examination, counseling, documenting in the electronic health record

## 2024-04-25 ENCOUNTER — Ambulatory Visit: Admitting: Podiatry

## 2024-04-25 ENCOUNTER — Ambulatory Visit: Admitting: Pulmonary Disease

## 2024-04-27 ENCOUNTER — Ambulatory Visit: Admitting: Pulmonary Disease

## 2024-05-04 ENCOUNTER — Encounter: Admitting: Family Medicine

## 2024-05-16 ENCOUNTER — Ambulatory Visit: Admitting: Podiatry

## 2024-06-19 ENCOUNTER — Ambulatory Visit: Admitting: Internal Medicine

## 2024-06-21 ENCOUNTER — Ambulatory Visit (INDEPENDENT_AMBULATORY_CARE_PROVIDER_SITE_OTHER): Admitting: Audiology

## 2024-06-21 ENCOUNTER — Ambulatory Visit (INDEPENDENT_AMBULATORY_CARE_PROVIDER_SITE_OTHER): Admitting: Otolaryngology

## 2024-06-22 ENCOUNTER — Ambulatory Visit (INDEPENDENT_AMBULATORY_CARE_PROVIDER_SITE_OTHER): Admitting: Otolaryngology

## 2024-06-22 ENCOUNTER — Ambulatory Visit (INDEPENDENT_AMBULATORY_CARE_PROVIDER_SITE_OTHER): Admitting: Audiology
